# Patient Record
Sex: Female | Born: 1935 | Race: White | Hispanic: No | Marital: Married | Smoking: Former smoker
Health system: Southern US, Community
[De-identification: ages and names within clinical notes are randomized; demographics above are authoritative.]

## PROBLEM LIST (undated history)

## (undated) DIAGNOSIS — T4145XA Adverse effect of unspecified anesthetic, initial encounter: Secondary | ICD-10-CM

## (undated) DIAGNOSIS — I739 Peripheral vascular disease, unspecified: Secondary | ICD-10-CM

## (undated) DIAGNOSIS — E669 Obesity, unspecified: Secondary | ICD-10-CM

## (undated) DIAGNOSIS — H209 Unspecified iridocyclitis: Secondary | ICD-10-CM

## (undated) DIAGNOSIS — K219 Gastro-esophageal reflux disease without esophagitis: Secondary | ICD-10-CM

## (undated) DIAGNOSIS — M1990 Unspecified osteoarthritis, unspecified site: Secondary | ICD-10-CM

## (undated) DIAGNOSIS — I428 Other cardiomyopathies: Secondary | ICD-10-CM

## (undated) DIAGNOSIS — G4733 Obstructive sleep apnea (adult) (pediatric): Secondary | ICD-10-CM

## (undated) DIAGNOSIS — N289 Disorder of kidney and ureter, unspecified: Secondary | ICD-10-CM

## (undated) DIAGNOSIS — I639 Cerebral infarction, unspecified: Secondary | ICD-10-CM

## (undated) DIAGNOSIS — F329 Major depressive disorder, single episode, unspecified: Secondary | ICD-10-CM

## (undated) DIAGNOSIS — D649 Anemia, unspecified: Secondary | ICD-10-CM

## (undated) DIAGNOSIS — T8859XA Other complications of anesthesia, initial encounter: Secondary | ICD-10-CM

## (undated) DIAGNOSIS — H9190 Unspecified hearing loss, unspecified ear: Secondary | ICD-10-CM

## (undated) DIAGNOSIS — I480 Paroxysmal atrial fibrillation: Secondary | ICD-10-CM

## (undated) DIAGNOSIS — C801 Malignant (primary) neoplasm, unspecified: Secondary | ICD-10-CM

## (undated) DIAGNOSIS — I82409 Acute embolism and thrombosis of unspecified deep veins of unspecified lower extremity: Secondary | ICD-10-CM

## (undated) DIAGNOSIS — E785 Hyperlipidemia, unspecified: Secondary | ICD-10-CM

## (undated) DIAGNOSIS — Z7901 Long term (current) use of anticoagulants: Secondary | ICD-10-CM

## (undated) DIAGNOSIS — E119 Type 2 diabetes mellitus without complications: Secondary | ICD-10-CM

## (undated) DIAGNOSIS — I10 Essential (primary) hypertension: Secondary | ICD-10-CM

## (undated) DIAGNOSIS — E079 Disorder of thyroid, unspecified: Secondary | ICD-10-CM

## (undated) DIAGNOSIS — I214 Non-ST elevation (NSTEMI) myocardial infarction: Secondary | ICD-10-CM

## (undated) DIAGNOSIS — F3289 Other specified depressive episodes: Secondary | ICD-10-CM

## (undated) DIAGNOSIS — E039 Hypothyroidism, unspecified: Secondary | ICD-10-CM

## (undated) DIAGNOSIS — Z9989 Dependence on other enabling machines and devices: Secondary | ICD-10-CM

## (undated) HISTORY — DX: Malignant (primary) neoplasm, unspecified: C80.1

## (undated) HISTORY — DX: Obstructive sleep apnea (adult) (pediatric): G47.33

## (undated) HISTORY — DX: Unspecified osteoarthritis, unspecified site: M19.90

## (undated) HISTORY — PX: FOOT SURGERY: SHX648

## (undated) HISTORY — DX: Acute embolism and thrombosis of unspecified deep veins of unspecified lower extremity: I82.409

## (undated) HISTORY — DX: Unspecified iridocyclitis: H20.9

## (undated) HISTORY — DX: Major depressive disorder, single episode, unspecified: F32.9

## (undated) HISTORY — DX: Long term (current) use of anticoagulants: Z79.01

## (undated) HISTORY — DX: Other specified depressive episodes: F32.89

## (undated) HISTORY — DX: Hyperlipidemia, unspecified: E78.5

## (undated) HISTORY — DX: Essential (primary) hypertension: I10

## (undated) HISTORY — DX: Obesity, unspecified: E66.9

## (undated) HISTORY — DX: Other cardiomyopathies: I42.8

## (undated) HISTORY — DX: Paroxysmal atrial fibrillation: I48.0

## (undated) HISTORY — DX: Dependence on other enabling machines and devices: Z99.89

## (undated) HISTORY — DX: Non-ST elevation (NSTEMI) myocardial infarction: I21.4

## (undated) HISTORY — PX: CATARACT EXTRACTION: SUR2

## (undated) HISTORY — PX: COLONOSCOPY W/ POLYPECTOMY: SHX1380

## (undated) HISTORY — DX: Disorder of thyroid, unspecified: E07.9

## (undated) HISTORY — PX: CARDIAC CATHETERIZATION: SHX172

## (undated) HISTORY — DX: Disorder of kidney and ureter, unspecified: N28.9

## (undated) HISTORY — DX: Anemia, unspecified: D64.9

## (undated) HISTORY — DX: Peripheral vascular disease, unspecified: I73.9

## (undated) HISTORY — PX: MASTECTOMY: SHX3

## (undated) HISTORY — DX: Type 2 diabetes mellitus without complications: E11.9

## (undated) HISTORY — PX: BREAST LUMPECTOMY: SHX2

---
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
## 1967-05-26 DIAGNOSIS — E079 Disorder of thyroid, unspecified: Secondary | ICD-10-CM

## 1967-05-26 HISTORY — DX: Disorder of thyroid, unspecified: E07.9

## 1967-05-26 HISTORY — PX: THYROIDECTOMY, PARTIAL: SHX18

## 1972-05-25 HISTORY — PX: CYSTECTOMY: SUR359

## 1972-05-25 HISTORY — PX: ABDOMINAL HYSTERECTOMY: SHX81

## 1998-05-25 HISTORY — PX: PAROTID GLAND TUMOR EXCISION: SHX5221

## 1998-07-22 ENCOUNTER — Encounter: Payer: Self-pay | Admitting: Hematology and Oncology

## 1998-07-22 ENCOUNTER — Ambulatory Visit (HOSPITAL_COMMUNITY): Admission: RE | Admit: 1998-07-22 | Discharge: 1998-07-22 | Payer: Self-pay | Admitting: Hematology and Oncology

## 1998-10-17 ENCOUNTER — Ambulatory Visit: Admission: RE | Admit: 1998-10-17 | Discharge: 1998-10-17 | Payer: Self-pay

## 1999-05-12 ENCOUNTER — Encounter: Payer: Self-pay | Admitting: Otolaryngology

## 1999-05-14 ENCOUNTER — Encounter (INDEPENDENT_AMBULATORY_CARE_PROVIDER_SITE_OTHER): Payer: Self-pay | Admitting: *Deleted

## 1999-05-14 ENCOUNTER — Ambulatory Visit (HOSPITAL_COMMUNITY): Admission: RE | Admit: 1999-05-14 | Discharge: 1999-05-15 | Payer: Self-pay | Admitting: Otolaryngology

## 2000-08-09 ENCOUNTER — Other Ambulatory Visit: Admission: RE | Admit: 2000-08-09 | Discharge: 2000-08-09 | Payer: Self-pay | Admitting: Internal Medicine

## 2000-08-25 ENCOUNTER — Ambulatory Visit (HOSPITAL_COMMUNITY): Admission: RE | Admit: 2000-08-25 | Discharge: 2000-08-25 | Payer: Self-pay | Admitting: Internal Medicine

## 2000-11-08 ENCOUNTER — Encounter: Admission: RE | Admit: 2000-11-08 | Discharge: 2000-12-22 | Payer: Self-pay | Admitting: Internal Medicine

## 2001-02-17 ENCOUNTER — Encounter: Payer: Self-pay | Admitting: Internal Medicine

## 2001-02-17 ENCOUNTER — Encounter: Admission: RE | Admit: 2001-02-17 | Discharge: 2001-02-17 | Payer: Self-pay | Admitting: Internal Medicine

## 2001-03-11 ENCOUNTER — Ambulatory Visit (HOSPITAL_BASED_OUTPATIENT_CLINIC_OR_DEPARTMENT_OTHER): Admission: RE | Admit: 2001-03-11 | Discharge: 2001-03-11 | Payer: Self-pay | Admitting: Internal Medicine

## 2001-03-29 ENCOUNTER — Encounter: Payer: Self-pay | Admitting: Internal Medicine

## 2001-03-29 ENCOUNTER — Encounter: Admission: RE | Admit: 2001-03-29 | Discharge: 2001-03-29 | Payer: Self-pay | Admitting: Internal Medicine

## 2001-04-11 ENCOUNTER — Encounter: Admission: RE | Admit: 2001-04-11 | Discharge: 2001-07-10 | Payer: Self-pay | Admitting: Internal Medicine

## 2001-04-12 ENCOUNTER — Encounter: Admission: RE | Admit: 2001-04-12 | Discharge: 2001-04-12 | Payer: Self-pay | Admitting: Internal Medicine

## 2001-04-12 ENCOUNTER — Encounter: Payer: Self-pay | Admitting: Internal Medicine

## 2002-05-25 DIAGNOSIS — I639 Cerebral infarction, unspecified: Secondary | ICD-10-CM

## 2002-05-25 HISTORY — DX: Cerebral infarction, unspecified: I63.9

## 2002-07-18 ENCOUNTER — Encounter: Payer: Self-pay | Admitting: Internal Medicine

## 2002-07-18 ENCOUNTER — Inpatient Hospital Stay (HOSPITAL_COMMUNITY): Admission: EM | Admit: 2002-07-18 | Discharge: 2002-07-20 | Payer: Self-pay | Admitting: Emergency Medicine

## 2002-07-19 ENCOUNTER — Encounter: Payer: Self-pay | Admitting: Cardiology

## 2002-09-26 ENCOUNTER — Other Ambulatory Visit: Admission: RE | Admit: 2002-09-26 | Discharge: 2002-09-26 | Payer: Self-pay | Admitting: Internal Medicine

## 2002-11-13 ENCOUNTER — Encounter (INDEPENDENT_AMBULATORY_CARE_PROVIDER_SITE_OTHER): Payer: Self-pay | Admitting: Specialist

## 2002-11-13 ENCOUNTER — Ambulatory Visit (HOSPITAL_COMMUNITY): Admission: RE | Admit: 2002-11-13 | Discharge: 2002-11-13 | Payer: Self-pay | Admitting: Gastroenterology

## 2002-12-27 ENCOUNTER — Emergency Department (HOSPITAL_COMMUNITY): Admission: EM | Admit: 2002-12-27 | Discharge: 2002-12-27 | Payer: Self-pay | Admitting: Emergency Medicine

## 2003-01-15 ENCOUNTER — Encounter: Admission: RE | Admit: 2003-01-15 | Discharge: 2003-01-15 | Payer: Self-pay | Admitting: Gastroenterology

## 2003-01-15 ENCOUNTER — Encounter: Payer: Self-pay | Admitting: Gastroenterology

## 2003-01-19 ENCOUNTER — Encounter: Admission: RE | Admit: 2003-01-19 | Discharge: 2003-01-19 | Payer: Self-pay | Admitting: Internal Medicine

## 2003-01-19 ENCOUNTER — Encounter: Payer: Self-pay | Admitting: Internal Medicine

## 2003-01-19 ENCOUNTER — Ambulatory Visit (HOSPITAL_COMMUNITY): Admission: RE | Admit: 2003-01-19 | Discharge: 2003-01-19 | Payer: Self-pay | Admitting: Internal Medicine

## 2004-04-23 ENCOUNTER — Ambulatory Visit: Payer: Self-pay | Admitting: Internal Medicine

## 2004-06-05 ENCOUNTER — Ambulatory Visit: Payer: Self-pay | Admitting: Internal Medicine

## 2004-08-28 ENCOUNTER — Ambulatory Visit: Payer: Self-pay | Admitting: Internal Medicine

## 2004-09-18 ENCOUNTER — Ambulatory Visit: Payer: Self-pay | Admitting: Internal Medicine

## 2004-10-27 ENCOUNTER — Ambulatory Visit: Payer: Self-pay | Admitting: Internal Medicine

## 2004-12-26 ENCOUNTER — Ambulatory Visit: Payer: Self-pay | Admitting: Internal Medicine

## 2004-12-31 ENCOUNTER — Ambulatory Visit: Payer: Self-pay | Admitting: Internal Medicine

## 2005-03-02 ENCOUNTER — Ambulatory Visit: Payer: Self-pay | Admitting: Internal Medicine

## 2005-06-03 ENCOUNTER — Ambulatory Visit: Payer: Self-pay | Admitting: Internal Medicine

## 2005-09-02 ENCOUNTER — Ambulatory Visit: Payer: Self-pay | Admitting: Internal Medicine

## 2005-09-17 ENCOUNTER — Ambulatory Visit: Payer: Self-pay | Admitting: Internal Medicine

## 2005-09-18 ENCOUNTER — Ambulatory Visit: Payer: Self-pay | Admitting: Internal Medicine

## 2005-10-22 ENCOUNTER — Ambulatory Visit: Payer: Self-pay | Admitting: Internal Medicine

## 2005-10-26 ENCOUNTER — Emergency Department (HOSPITAL_COMMUNITY): Admission: EM | Admit: 2005-10-26 | Discharge: 2005-10-26 | Payer: Self-pay | Admitting: Emergency Medicine

## 2005-10-30 ENCOUNTER — Ambulatory Visit: Payer: Self-pay | Admitting: Internal Medicine

## 2005-11-05 ENCOUNTER — Encounter: Payer: Self-pay | Admitting: Vascular Surgery

## 2005-11-05 ENCOUNTER — Ambulatory Visit: Payer: Self-pay | Admitting: Endocrinology

## 2005-11-05 ENCOUNTER — Inpatient Hospital Stay (HOSPITAL_COMMUNITY): Admission: EM | Admit: 2005-11-05 | Discharge: 2005-11-08 | Payer: Self-pay | Admitting: Emergency Medicine

## 2005-11-06 ENCOUNTER — Encounter: Payer: Self-pay | Admitting: Orthopedic Surgery

## 2005-12-01 ENCOUNTER — Encounter: Admission: RE | Admit: 2005-12-01 | Discharge: 2005-12-23 | Payer: Self-pay | Admitting: Orthopedic Surgery

## 2005-12-01 ENCOUNTER — Ambulatory Visit: Payer: Self-pay | Admitting: Internal Medicine

## 2005-12-15 ENCOUNTER — Ambulatory Visit: Payer: Self-pay | Admitting: Internal Medicine

## 2006-01-15 ENCOUNTER — Ambulatory Visit: Payer: Self-pay | Admitting: Internal Medicine

## 2006-03-02 ENCOUNTER — Ambulatory Visit: Payer: Self-pay | Admitting: Internal Medicine

## 2006-04-09 ENCOUNTER — Ambulatory Visit: Payer: Self-pay | Admitting: Internal Medicine

## 2006-04-09 LAB — CONVERTED CEMR LAB
Basophils Absolute: 0 10*3/uL (ref 0.0–0.1)
Basophils Relative: 0.2 % (ref 0.0–1.0)
CO2: 26 meq/L (ref 19–32)
Calcium: 9.2 mg/dL (ref 8.4–10.5)
Eosinophil percent: 0.4 % (ref 0.0–5.0)
Glomerular Filtration Rate, Af Am: 38 mL/min/{1.73_m2}
Lymphocytes Relative: 13.1 % (ref 12.0–46.0)
MCHC: 33.1 g/dL (ref 30.0–36.0)
Neutrophils Relative %: 77.1 % — ABNORMAL HIGH (ref 43.0–77.0)
Potassium: 5 meq/L (ref 3.5–5.1)
RBC: 3.7 M/uL — ABNORMAL LOW (ref 3.87–5.11)
RDW: 16 % — ABNORMAL HIGH (ref 11.5–14.6)
Saturation Ratios: 8.6 % — ABNORMAL LOW (ref 20.0–50.0)
TSH: 1.81 microintl units/mL (ref 0.35–5.50)
Total Protein: 6.9 g/dL (ref 6.0–8.3)

## 2006-04-13 ENCOUNTER — Ambulatory Visit: Payer: Self-pay | Admitting: Internal Medicine

## 2006-05-25 HISTORY — PX: EYE SURGERY: SHX253

## 2006-06-07 ENCOUNTER — Ambulatory Visit: Payer: Self-pay | Admitting: Internal Medicine

## 2006-08-03 ENCOUNTER — Ambulatory Visit: Payer: Self-pay | Admitting: Internal Medicine

## 2006-08-03 LAB — CONVERTED CEMR LAB
AST: 17 units/L (ref 0–37)
Alkaline Phosphatase: 63 units/L (ref 39–117)
Bilirubin, Direct: 0.1 mg/dL (ref 0.0–0.3)
Calcium: 9.7 mg/dL (ref 8.4–10.5)
Chloride: 106 meq/L (ref 96–112)
Direct LDL: 199.9 mg/dL
GFR calc Af Amer: 38 mL/min
Glucose, Bld: 119 mg/dL — ABNORMAL HIGH (ref 70–99)
HDL: 42.9 mg/dL (ref >39.0)
Hgb A1c MFr Bld: 6.3 % — ABNORMAL HIGH (ref 4.6–6.0)
Sed Rate: 34 mm/hr — ABNORMAL HIGH (ref 0–25)
Sodium: 141 meq/L (ref 135–145)
Total Bilirubin: 0.7 mg/dL (ref 0.3–1.2)
Total CHOL/HDL Ratio: 6.7
Total Protein: 7.2 g/dL (ref 6.0–8.3)
Triglycerides: 242 mg/dL (ref 0–149)

## 2006-08-06 ENCOUNTER — Ambulatory Visit: Payer: Self-pay | Admitting: Internal Medicine

## 2006-08-31 ENCOUNTER — Ambulatory Visit: Payer: Self-pay | Admitting: Internal Medicine

## 2006-09-27 ENCOUNTER — Ambulatory Visit: Payer: Self-pay | Admitting: Internal Medicine

## 2006-09-27 LAB — CONVERTED CEMR LAB
Creatinine, Ser: 1.4 mg/dL — ABNORMAL HIGH (ref 0.4–1.2)
Microalb, Ur: 3.1 mg/dL — ABNORMAL HIGH (ref 0.0–1.9)
PTH: 29.2 pg/mL (ref 14.0–72.0)
Sed Rate: 25 mm/hr (ref 0–25)

## 2006-09-29 ENCOUNTER — Ambulatory Visit: Payer: Self-pay | Admitting: Internal Medicine

## 2006-12-17 ENCOUNTER — Ambulatory Visit: Payer: Self-pay | Admitting: Internal Medicine

## 2006-12-17 LAB — CONVERTED CEMR LAB
Basophils Absolute: 0 10*3/uL (ref 0.0–0.1)
Chloride: 100 meq/L (ref 96–112)
Creatinine, Ser: 1.3 mg/dL — ABNORMAL HIGH (ref 0.4–1.2)
GFR calc non Af Amer: 43 mL/min
HCT: 35.3 % — ABNORMAL LOW (ref 36.0–46.0)
Hemoglobin: 12.1 g/dL (ref 12.0–15.0)
MCV: 91.2 fL (ref 78.0–100.0)
Monocytes Absolute: 0.6 10*3/uL (ref 0.2–0.7)
Monocytes Relative: 7.2 % (ref 3.0–11.0)
Neutrophils Relative %: 79.9 % — ABNORMAL HIGH (ref 43.0–77.0)
RBC: 3.87 M/uL (ref 3.87–5.11)
RDW: 13.4 % (ref 11.5–14.6)
Saturation Ratios: 15.4 % — ABNORMAL LOW (ref 20.0–50.0)
Sodium: 137 meq/L (ref 135–145)
Transferrin: 282.3 mg/dL (ref >=212.0)
Vit D, 1,25-Dihydroxy: 14 — ABNORMAL LOW (ref 20–57)
Vitamin B-12: >1500 pg/mL — ABNORMAL HIGH (ref 211–911)
WBC: 8.3 10*3/uL (ref 4.5–10.5)

## 2006-12-24 ENCOUNTER — Ambulatory Visit: Payer: Self-pay | Admitting: Internal Medicine

## 2007-03-17 ENCOUNTER — Ambulatory Visit: Payer: Self-pay | Admitting: Internal Medicine

## 2007-03-17 LAB — CONVERTED CEMR LAB
ALT: 16 units/L (ref 0–35)
Albumin: 3.8 g/dL (ref 3.5–5.2)
BUN: 26 mg/dL — ABNORMAL HIGH (ref 6–23)
Basophils Absolute: 0.1 10*3/uL (ref 0.0–0.1)
Basophils Relative: 1 % (ref 0.0–1.0)
CO2: 26 meq/L (ref 19–32)
Creatinine, Ser: 1.4 mg/dL — ABNORMAL HIGH (ref 0.4–1.2)
Direct LDL: 174.3 mg/dL
Eosinophils Absolute: 0 10*3/uL (ref 0.0–0.6)
GFR calc non Af Amer: 40 mL/min
Hgb A1c MFr Bld: 5.9 % (ref 4.6–6.0)
Lymphocytes Relative: 16.2 % (ref 12.0–46.0)
MCHC: 35.2 g/dL (ref 30.0–36.0)
MCV: 93.5 fL (ref 78.0–100.0)
Platelets: 380 10*3/uL (ref 150–400)
Potassium: 4 meq/L (ref 3.5–5.1)
TSH: 0.48 microintl units/mL (ref 0.35–5.50)
Total Bilirubin: 0.8 mg/dL (ref 0.3–1.2)
Total Protein: 6.8 g/dL (ref 6.0–8.3)
VLDL: 74 mg/dL — ABNORMAL HIGH (ref 0–40)

## 2007-03-22 ENCOUNTER — Encounter: Payer: Self-pay | Admitting: Internal Medicine

## 2007-03-22 ENCOUNTER — Ambulatory Visit: Payer: Self-pay | Admitting: Internal Medicine

## 2007-03-22 DIAGNOSIS — M1990 Unspecified osteoarthritis, unspecified site: Secondary | ICD-10-CM | POA: Insufficient documentation

## 2007-03-22 DIAGNOSIS — F329 Major depressive disorder, single episode, unspecified: Secondary | ICD-10-CM

## 2007-03-22 DIAGNOSIS — F411 Generalized anxiety disorder: Secondary | ICD-10-CM | POA: Insufficient documentation

## 2007-03-22 DIAGNOSIS — E119 Type 2 diabetes mellitus without complications: Secondary | ICD-10-CM

## 2007-03-22 DIAGNOSIS — D649 Anemia, unspecified: Secondary | ICD-10-CM | POA: Insufficient documentation

## 2007-03-22 DIAGNOSIS — M545 Low back pain: Secondary | ICD-10-CM

## 2007-03-22 DIAGNOSIS — E785 Hyperlipidemia, unspecified: Secondary | ICD-10-CM

## 2007-03-22 DIAGNOSIS — M353 Polymyalgia rheumatica: Secondary | ICD-10-CM | POA: Insufficient documentation

## 2007-04-28 ENCOUNTER — Telehealth: Payer: Self-pay | Admitting: Internal Medicine

## 2007-04-28 DIAGNOSIS — M21619 Bunion of unspecified foot: Secondary | ICD-10-CM

## 2007-05-13 ENCOUNTER — Encounter: Payer: Self-pay | Admitting: Internal Medicine

## 2007-06-17 ENCOUNTER — Encounter: Payer: Self-pay | Admitting: Internal Medicine

## 2007-07-05 ENCOUNTER — Ambulatory Visit: Payer: Self-pay | Admitting: Internal Medicine

## 2007-07-05 DIAGNOSIS — N184 Chronic kidney disease, stage 4 (severe): Secondary | ICD-10-CM | POA: Insufficient documentation

## 2007-07-05 DIAGNOSIS — M109 Gout, unspecified: Secondary | ICD-10-CM

## 2007-07-06 ENCOUNTER — Telehealth: Payer: Self-pay | Admitting: Internal Medicine

## 2007-07-13 ENCOUNTER — Encounter: Payer: Self-pay | Admitting: Internal Medicine

## 2007-08-03 ENCOUNTER — Encounter: Admission: RE | Admit: 2007-08-03 | Discharge: 2007-08-03 | Payer: Self-pay | Admitting: Orthopedic Surgery

## 2007-08-25 ENCOUNTER — Ambulatory Visit: Payer: Self-pay | Admitting: Internal Medicine

## 2007-08-25 DIAGNOSIS — G4710 Hypersomnia, unspecified: Secondary | ICD-10-CM | POA: Insufficient documentation

## 2007-08-25 DIAGNOSIS — G4730 Sleep apnea, unspecified: Secondary | ICD-10-CM

## 2007-08-25 DIAGNOSIS — R0609 Other forms of dyspnea: Secondary | ICD-10-CM

## 2007-08-25 DIAGNOSIS — G4733 Obstructive sleep apnea (adult) (pediatric): Secondary | ICD-10-CM

## 2007-08-25 DIAGNOSIS — E669 Obesity, unspecified: Secondary | ICD-10-CM

## 2007-08-30 ENCOUNTER — Ambulatory Visit: Payer: Self-pay | Admitting: Internal Medicine

## 2007-08-31 ENCOUNTER — Encounter: Payer: Self-pay | Admitting: Internal Medicine

## 2007-09-07 ENCOUNTER — Encounter: Payer: Self-pay | Admitting: Internal Medicine

## 2007-09-16 ENCOUNTER — Inpatient Hospital Stay (HOSPITAL_COMMUNITY): Admission: RE | Admit: 2007-09-16 | Discharge: 2007-09-19 | Payer: Self-pay | Admitting: Orthopedic Surgery

## 2007-10-06 ENCOUNTER — Ambulatory Visit: Payer: Self-pay | Admitting: Internal Medicine

## 2007-10-07 LAB — CONVERTED CEMR LAB
Calcium: 9.1 mg/dL (ref 8.4–10.5)
Creatinine, Ser: 1.9 mg/dL — ABNORMAL HIGH (ref 0.4–1.2)
GFR calc Af Amer: 34 mL/min
GFR calc non Af Amer: 28 mL/min
Glucose, Bld: 110 mg/dL — ABNORMAL HIGH (ref 70–99)
Potassium: 4.6 meq/L (ref 3.5–5.1)

## 2007-10-11 ENCOUNTER — Ambulatory Visit: Payer: Self-pay | Admitting: Internal Medicine

## 2007-10-11 DIAGNOSIS — R600 Localized edema: Secondary | ICD-10-CM

## 2007-10-19 ENCOUNTER — Encounter: Admission: RE | Admit: 2007-10-19 | Discharge: 2007-10-19 | Payer: Self-pay | Admitting: Orthopedic Surgery

## 2007-10-24 ENCOUNTER — Ambulatory Visit: Payer: Self-pay | Admitting: Internal Medicine

## 2007-10-26 ENCOUNTER — Encounter: Admission: RE | Admit: 2007-10-26 | Discharge: 2007-10-26 | Payer: Self-pay | Admitting: Orthopedic Surgery

## 2007-10-28 ENCOUNTER — Encounter (HOSPITAL_BASED_OUTPATIENT_CLINIC_OR_DEPARTMENT_OTHER): Admission: RE | Admit: 2007-10-28 | Discharge: 2008-01-24 | Payer: Self-pay | Admitting: Surgery

## 2007-12-01 ENCOUNTER — Ambulatory Visit (HOSPITAL_COMMUNITY): Admission: RE | Admit: 2007-12-01 | Discharge: 2007-12-01 | Payer: Self-pay | Admitting: Internal Medicine

## 2007-12-19 ENCOUNTER — Telehealth: Payer: Self-pay | Admitting: Internal Medicine

## 2008-01-11 ENCOUNTER — Ambulatory Visit: Payer: Self-pay | Admitting: Internal Medicine

## 2008-01-12 LAB — CONVERTED CEMR LAB
Creatinine, Ser: 2 mg/dL — ABNORMAL HIGH (ref 0.4–1.2)
GFR calc non Af Amer: 26 mL/min
Potassium: 4.1 meq/L (ref 3.5–5.1)
Sodium: 140 meq/L (ref 135–145)

## 2008-01-17 ENCOUNTER — Ambulatory Visit: Payer: Self-pay | Admitting: Internal Medicine

## 2008-01-17 DIAGNOSIS — K120 Recurrent oral aphthae: Secondary | ICD-10-CM | POA: Insufficient documentation

## 2008-01-17 DIAGNOSIS — I10 Essential (primary) hypertension: Secondary | ICD-10-CM

## 2008-02-16 ENCOUNTER — Encounter (HOSPITAL_BASED_OUTPATIENT_CLINIC_OR_DEPARTMENT_OTHER): Admission: RE | Admit: 2008-02-16 | Discharge: 2008-02-21 | Payer: Self-pay | Admitting: Internal Medicine

## 2008-03-15 ENCOUNTER — Encounter (HOSPITAL_BASED_OUTPATIENT_CLINIC_OR_DEPARTMENT_OTHER): Admission: RE | Admit: 2008-03-15 | Discharge: 2008-05-15 | Payer: Self-pay | Admitting: Internal Medicine

## 2008-04-06 ENCOUNTER — Ambulatory Visit: Payer: Self-pay | Admitting: Internal Medicine

## 2008-04-08 LAB — CONVERTED CEMR LAB
BUN: 22 mg/dL (ref 6–23)
Basophils Absolute: 0 10*3/uL (ref 0.0–0.1)
Basophils Relative: 0.1 % (ref 0.0–3.0)
Creatinine, Ser: 1.3 mg/dL — ABNORMAL HIGH (ref 0.4–1.2)
Eosinophils Absolute: 0 10*3/uL (ref 0.0–0.7)
GFR calc Af Amer: 52 mL/min
GFR calc non Af Amer: 43 mL/min
HCT: 34.4 % — ABNORMAL LOW (ref 36.0–46.0)
MCHC: 32.7 g/dL (ref 30.0–36.0)
Monocytes Absolute: 0.5 10*3/uL (ref 0.1–1.0)
Potassium: 4.2 meq/L (ref 3.5–5.1)

## 2008-04-13 ENCOUNTER — Ambulatory Visit: Payer: Self-pay | Admitting: Internal Medicine

## 2008-04-24 ENCOUNTER — Ambulatory Visit: Payer: Self-pay | Admitting: Internal Medicine

## 2008-05-14 ENCOUNTER — Ambulatory Visit: Payer: Self-pay | Admitting: Internal Medicine

## 2008-05-15 ENCOUNTER — Encounter: Payer: Self-pay | Admitting: Internal Medicine

## 2008-05-16 LAB — CONVERTED CEMR LAB: TSH: 0.1 microintl units/mL — ABNORMAL LOW (ref 0.35–5.50)

## 2008-05-25 DIAGNOSIS — C801 Malignant (primary) neoplasm, unspecified: Secondary | ICD-10-CM

## 2008-05-25 DIAGNOSIS — I82409 Acute embolism and thrombosis of unspecified deep veins of unspecified lower extremity: Secondary | ICD-10-CM

## 2008-05-25 HISTORY — DX: Malignant (primary) neoplasm, unspecified: C80.1

## 2008-05-25 HISTORY — DX: Acute embolism and thrombosis of unspecified deep veins of unspecified lower extremity: I82.409

## 2008-05-28 ENCOUNTER — Telehealth: Payer: Self-pay | Admitting: Internal Medicine

## 2008-05-31 ENCOUNTER — Encounter (HOSPITAL_BASED_OUTPATIENT_CLINIC_OR_DEPARTMENT_OTHER): Admission: RE | Admit: 2008-05-31 | Discharge: 2008-08-29 | Payer: Self-pay | Admitting: Internal Medicine

## 2008-06-19 ENCOUNTER — Encounter: Payer: Self-pay | Admitting: Internal Medicine

## 2008-06-28 ENCOUNTER — Encounter: Payer: Self-pay | Admitting: Internal Medicine

## 2008-08-10 ENCOUNTER — Ambulatory Visit: Payer: Self-pay | Admitting: Internal Medicine

## 2008-08-13 LAB — CONVERTED CEMR LAB
CO2: 28 meq/L (ref 19–32)
Calcium: 9.4 mg/dL (ref 8.4–10.5)
Chloride: 105 meq/L (ref 96–112)
Creatinine, Ser: 1.2 mg/dL (ref 0.4–1.2)
GFR calc non Af Amer: 47.14 mL/min (ref >60)
Potassium: 4.1 meq/L (ref 3.5–5.1)
Sodium: 141 meq/L (ref 135–145)
TSH: 0.91 microintl units/mL (ref 0.35–5.50)
Uric Acid, Serum: 7.2 mg/dL — ABNORMAL HIGH (ref 2.4–7.0)

## 2008-08-15 ENCOUNTER — Ambulatory Visit: Payer: Self-pay | Admitting: Internal Medicine

## 2008-10-18 ENCOUNTER — Telehealth: Payer: Self-pay | Admitting: Internal Medicine

## 2008-12-06 ENCOUNTER — Ambulatory Visit: Payer: Self-pay | Admitting: Internal Medicine

## 2008-12-06 LAB — CONVERTED CEMR LAB
BUN: 30 mg/dL — ABNORMAL HIGH (ref 6–23)
Basophils Relative: 1.6 % (ref 0.0–3.0)
Bilirubin, Direct: 0.2 mg/dL (ref 0.0–0.3)
CO2: 27 meq/L (ref 19–32)
Chloride: 104 meq/L (ref 96–112)
Eosinophils Absolute: 0 10*3/uL (ref 0.0–0.7)
Glucose, Bld: 90 mg/dL (ref 70–99)
HCT: 35.4 % — ABNORMAL LOW (ref 36.0–46.0)
HDL: 36.5 mg/dL — ABNORMAL LOW (ref >39.00)
Hemoglobin: 12.1 g/dL (ref 12.0–15.0)
Lymphocytes Relative: 10.3 % — ABNORMAL LOW (ref 12.0–46.0)
Lymphs Abs: 0.5 10*3/uL — ABNORMAL LOW (ref 0.7–4.0)
MCHC: 34.1 g/dL (ref 30.0–36.0)
MCV: 97 fL (ref 78.0–100.0)
Neutro Abs: 4 10*3/uL (ref 1.4–7.7)
Potassium: 4.5 meq/L (ref 3.5–5.1)
RBC: 3.65 M/uL — ABNORMAL LOW (ref 3.87–5.11)
Sodium: 139 meq/L (ref 135–145)
Total Bilirubin: 1 mg/dL (ref 0.3–1.2)
Total CHOL/HDL Ratio: 6
Total Protein: 6.9 g/dL (ref 6.0–8.3)
VLDL: 35 mg/dL (ref 0.0–40.0)

## 2008-12-11 ENCOUNTER — Ambulatory Visit: Payer: Self-pay | Admitting: Internal Medicine

## 2008-12-11 DIAGNOSIS — Z8639 Personal history of other endocrine, nutritional and metabolic disease: Secondary | ICD-10-CM

## 2008-12-11 DIAGNOSIS — Z862 Personal history of diseases of the blood and blood-forming organs and certain disorders involving the immune mechanism: Secondary | ICD-10-CM

## 2008-12-19 ENCOUNTER — Encounter: Admission: RE | Admit: 2008-12-19 | Discharge: 2008-12-19 | Payer: Self-pay | Admitting: Internal Medicine

## 2008-12-31 ENCOUNTER — Encounter: Payer: Self-pay | Admitting: Internal Medicine

## 2009-01-07 ENCOUNTER — Ambulatory Visit: Payer: Self-pay | Admitting: Internal Medicine

## 2009-01-07 LAB — CONVERTED CEMR LAB
AST: 21 units/L (ref 0–37)
Albumin: 3.6 g/dL (ref 3.5–5.2)
Alkaline Phosphatase: 95 units/L (ref 39–117)
BUN: 19 mg/dL (ref 6–23)
CO2: 25 meq/L (ref 19–32)
Calcium: 9 mg/dL (ref 8.4–10.5)
Glucose, Bld: 121 mg/dL — ABNORMAL HIGH (ref 70–99)
Sodium: 139 meq/L (ref 135–145)
Total Protein: 6.7 g/dL (ref 6.0–8.3)

## 2009-01-09 ENCOUNTER — Encounter: Payer: Self-pay | Admitting: Internal Medicine

## 2009-01-14 ENCOUNTER — Ambulatory Visit: Payer: Self-pay | Admitting: Internal Medicine

## 2009-01-16 ENCOUNTER — Encounter: Admission: RE | Admit: 2009-01-16 | Discharge: 2009-01-16 | Payer: Self-pay | Admitting: *Deleted

## 2009-01-17 ENCOUNTER — Telehealth: Payer: Self-pay | Admitting: Internal Medicine

## 2009-01-18 ENCOUNTER — Encounter: Payer: Self-pay | Admitting: Internal Medicine

## 2009-01-21 ENCOUNTER — Encounter: Payer: Self-pay | Admitting: Internal Medicine

## 2009-01-23 ENCOUNTER — Telehealth (INDEPENDENT_AMBULATORY_CARE_PROVIDER_SITE_OTHER): Payer: Self-pay | Admitting: *Deleted

## 2009-01-24 ENCOUNTER — Ambulatory Visit: Payer: Self-pay

## 2009-01-24 ENCOUNTER — Encounter: Payer: Self-pay | Admitting: Internal Medicine

## 2009-01-24 ENCOUNTER — Encounter: Payer: Self-pay | Admitting: Cardiology

## 2009-01-31 ENCOUNTER — Inpatient Hospital Stay (HOSPITAL_COMMUNITY): Admission: EM | Admit: 2009-01-31 | Discharge: 2009-02-08 | Payer: Self-pay | Admitting: Emergency Medicine

## 2009-01-31 ENCOUNTER — Ambulatory Visit: Payer: Self-pay | Admitting: Internal Medicine

## 2009-01-31 ENCOUNTER — Encounter (INDEPENDENT_AMBULATORY_CARE_PROVIDER_SITE_OTHER): Payer: Self-pay | Admitting: Emergency Medicine

## 2009-01-31 DIAGNOSIS — Z86718 Personal history of other venous thrombosis and embolism: Secondary | ICD-10-CM | POA: Insufficient documentation

## 2009-02-02 ENCOUNTER — Ambulatory Visit: Payer: Self-pay | Admitting: Oncology

## 2009-02-04 ENCOUNTER — Ambulatory Visit: Payer: Self-pay | Admitting: Vascular Surgery

## 2009-02-04 ENCOUNTER — Encounter (INDEPENDENT_AMBULATORY_CARE_PROVIDER_SITE_OTHER): Payer: Self-pay | Admitting: Internal Medicine

## 2009-02-11 ENCOUNTER — Ambulatory Visit: Payer: Self-pay | Admitting: Oncology

## 2009-02-12 ENCOUNTER — Telehealth: Payer: Self-pay | Admitting: Internal Medicine

## 2009-02-13 ENCOUNTER — Ambulatory Visit: Payer: Self-pay | Admitting: Internal Medicine

## 2009-02-13 DIAGNOSIS — C50919 Malignant neoplasm of unspecified site of unspecified female breast: Secondary | ICD-10-CM | POA: Insufficient documentation

## 2009-02-21 ENCOUNTER — Encounter: Payer: Self-pay | Admitting: Internal Medicine

## 2009-02-25 ENCOUNTER — Encounter: Payer: Self-pay | Admitting: Internal Medicine

## 2009-02-25 LAB — CBC WITH DIFFERENTIAL/PLATELET
Eosinophils Absolute: 0.2 10*3/uL (ref 0.0–0.5)
HCT: 33.5 % — ABNORMAL LOW (ref 34.8–46.6)
HGB: 11.2 g/dL — ABNORMAL LOW (ref 11.6–15.9)
LYMPH%: 13.7 % — ABNORMAL LOW (ref 14.0–49.7)
MONO#: 0.6 10*3/uL (ref 0.1–0.9)
NEUT#: 5.2 10*3/uL (ref 1.5–6.5)
NEUT%: 75.3 % (ref 38.4–76.8)
Platelets: 315 10*3/uL (ref 145–400)
WBC: 6.9 10*3/uL (ref 3.9–10.3)

## 2009-03-11 ENCOUNTER — Ambulatory Visit: Payer: Self-pay | Admitting: Internal Medicine

## 2009-03-11 LAB — CONVERTED CEMR LAB
Basophils Relative: 0.9 % (ref 0.0–3.0)
Bilirubin, Direct: 0.1 mg/dL (ref 0.0–0.3)
Calcium: 8.3 mg/dL — ABNORMAL LOW (ref 8.4–10.5)
Chloride: 108 meq/L (ref 96–112)
Creatinine, Ser: 1.2 mg/dL (ref 0.4–1.2)
Eosinophils Absolute: 0.1 10*3/uL (ref 0.0–0.7)
HCT: 35.7 % — ABNORMAL LOW (ref 36.0–46.0)
Hemoglobin: 11.8 g/dL — ABNORMAL LOW (ref 12.0–15.0)
MCHC: 33 g/dL (ref 30.0–36.0)
MCV: 91.1 fL (ref 78.0–100.0)
Monocytes Absolute: 0.5 10*3/uL (ref 0.1–1.0)
Neutro Abs: 4.7 10*3/uL (ref 1.4–7.7)
RBC: 3.92 M/uL (ref 3.87–5.11)
Total Bilirubin: 0.6 mg/dL (ref 0.3–1.2)

## 2009-03-14 ENCOUNTER — Encounter: Payer: Self-pay | Admitting: Internal Medicine

## 2009-03-18 ENCOUNTER — Ambulatory Visit: Payer: Self-pay | Admitting: Internal Medicine

## 2009-03-20 ENCOUNTER — Ambulatory Visit: Payer: Self-pay | Admitting: Oncology

## 2009-03-22 ENCOUNTER — Encounter (INDEPENDENT_AMBULATORY_CARE_PROVIDER_SITE_OTHER): Payer: Self-pay | Admitting: General Surgery

## 2009-03-22 ENCOUNTER — Ambulatory Visit (HOSPITAL_COMMUNITY): Admission: RE | Admit: 2009-03-22 | Discharge: 2009-03-22 | Payer: Self-pay | Admitting: General Surgery

## 2009-03-22 ENCOUNTER — Ambulatory Visit (HOSPITAL_COMMUNITY): Admission: RE | Admit: 2009-03-22 | Discharge: 2009-03-23 | Payer: Self-pay | Admitting: General Surgery

## 2009-03-25 ENCOUNTER — Encounter: Payer: Self-pay | Admitting: Internal Medicine

## 2009-03-29 LAB — PROTIME-INR
INR: 2.4 (ref 2.00–3.50)
Protime: 28.8 Seconds — ABNORMAL HIGH (ref 10.6–13.4)

## 2009-04-01 ENCOUNTER — Encounter: Payer: Self-pay | Admitting: Internal Medicine

## 2009-04-01 LAB — PROTIME-INR
INR: 2.5 (ref 2.00–3.50)
Protime: 30 Seconds — ABNORMAL HIGH (ref 10.6–13.4)

## 2009-04-05 ENCOUNTER — Encounter: Payer: Self-pay | Admitting: Internal Medicine

## 2009-04-05 LAB — PROTIME-INR: INR: 2.4 (ref 2.00–3.50)

## 2009-04-10 ENCOUNTER — Telehealth (INDEPENDENT_AMBULATORY_CARE_PROVIDER_SITE_OTHER): Payer: Self-pay | Admitting: *Deleted

## 2009-04-12 LAB — PROTIME-INR: INR: 1.6 — ABNORMAL LOW (ref 2.00–3.50)

## 2009-04-19 ENCOUNTER — Ambulatory Visit: Payer: Self-pay | Admitting: Oncology

## 2009-04-23 ENCOUNTER — Ambulatory Visit: Payer: Self-pay | Admitting: Internal Medicine

## 2009-04-26 LAB — PROTIME-INR
INR: 2.9 (ref 2.00–3.50)
Protime: 34.8 Seconds — ABNORMAL HIGH (ref 10.6–13.4)

## 2009-05-03 ENCOUNTER — Encounter: Payer: Self-pay | Admitting: Internal Medicine

## 2009-05-03 LAB — CBC WITH DIFFERENTIAL/PLATELET
BASO%: 0.1 % (ref 0.0–2.0)
Eosinophils Absolute: 0.2 10*3/uL (ref 0.0–0.5)
LYMPH%: 12.7 % — ABNORMAL LOW (ref 14.0–49.7)
MCHC: 32.6 g/dL (ref 31.5–36.0)
MONO#: 0.6 10*3/uL (ref 0.1–0.9)
NEUT#: 5.1 10*3/uL (ref 1.5–6.5)
Platelets: 269 10*3/uL (ref 145–400)
RBC: 4.09 10*6/uL (ref 3.70–5.45)
RDW: 14.9 % — ABNORMAL HIGH (ref 11.2–14.5)
WBC: 6.8 10*3/uL (ref 3.9–10.3)
lymph#: 0.9 10*3/uL (ref 0.9–3.3)

## 2009-05-03 LAB — PROTIME-INR: Protime: 34.8 Seconds — ABNORMAL HIGH (ref 10.6–13.4)

## 2009-05-08 ENCOUNTER — Telehealth: Payer: Self-pay | Admitting: Internal Medicine

## 2009-05-08 ENCOUNTER — Encounter: Payer: Self-pay | Admitting: Internal Medicine

## 2009-05-13 ENCOUNTER — Ambulatory Visit: Payer: Self-pay | Admitting: Internal Medicine

## 2009-05-14 ENCOUNTER — Encounter: Payer: Self-pay | Admitting: Internal Medicine

## 2009-05-14 LAB — CONVERTED CEMR LAB
BUN: 25 mg/dL — ABNORMAL HIGH (ref 6–23)
Basophils Absolute: 0.2 10*3/uL — ABNORMAL HIGH (ref 0.0–0.1)
Chloride: 102 meq/L (ref 96–112)
Creatinine, Ser: 1.4 mg/dL — ABNORMAL HIGH (ref 0.4–1.2)
Eosinophils Relative: 2.9 % (ref 0.0–5.0)
GFR calc non Af Amer: 39.38 mL/min (ref >60)
MCV: 88.7 fL (ref 78.0–100.0)
Monocytes Absolute: 0.6 10*3/uL (ref 0.1–1.0)
Neutrophils Relative %: 72.3 % (ref 43.0–77.0)
Platelets: 294 10*3/uL (ref 150.0–400.0)
Sed Rate: 20 mm/hr (ref 0–22)
Uric Acid, Serum: 7.7 mg/dL — ABNORMAL HIGH (ref 2.4–7.0)
WBC: 7.6 10*3/uL (ref 4.5–10.5)

## 2009-05-28 ENCOUNTER — Ambulatory Visit: Payer: Self-pay | Admitting: Internal Medicine

## 2009-05-31 ENCOUNTER — Ambulatory Visit: Payer: Self-pay | Admitting: Oncology

## 2009-06-14 LAB — PROTIME-INR
INR: 3.1 (ref 2.00–3.50)
Protime: 37.2 Seconds — ABNORMAL HIGH (ref 10.6–13.4)

## 2009-07-23 ENCOUNTER — Ambulatory Visit: Payer: Self-pay | Admitting: Oncology

## 2009-07-26 ENCOUNTER — Encounter: Payer: Self-pay | Admitting: Internal Medicine

## 2009-07-26 LAB — CBC WITH DIFFERENTIAL/PLATELET
BASO%: 0.9 % (ref 0.0–2.0)
LYMPH%: 13.1 % — ABNORMAL LOW (ref 14.0–49.7)
MCHC: 32.4 g/dL (ref 31.5–36.0)
MONO#: 0.6 10*3/uL (ref 0.1–0.9)
MONO%: 7.9 % (ref 0.0–14.0)
Platelets: 278 10*3/uL (ref 145–400)
RBC: 3.9 10*6/uL (ref 3.70–5.45)
WBC: 8.2 10*3/uL (ref 3.9–10.3)
nRBC: 0 % (ref 0–0)

## 2009-07-26 LAB — PROTIME-INR: INR: 4.2 — ABNORMAL HIGH (ref 2.00–3.50)

## 2009-07-29 LAB — PROTIME-INR
INR: 2.1 (ref 2.00–3.50)
Protime: 25.2 Seconds — ABNORMAL HIGH (ref 10.6–13.4)

## 2009-08-05 ENCOUNTER — Telehealth: Payer: Self-pay | Admitting: Internal Medicine

## 2009-08-09 ENCOUNTER — Telehealth: Payer: Self-pay | Admitting: Internal Medicine

## 2009-08-21 ENCOUNTER — Ambulatory Visit: Payer: Self-pay | Admitting: Internal Medicine

## 2009-08-21 LAB — CONVERTED CEMR LAB
ALT: 18 units/L (ref 0–35)
Bilirubin, Direct: 0.1 mg/dL (ref 0.0–0.3)
Calcium: 9.3 mg/dL (ref 8.4–10.5)
Chloride: 104 meq/L (ref 96–112)
Creatinine, Ser: 1.4 mg/dL — ABNORMAL HIGH (ref 0.4–1.2)
GFR calc non Af Amer: 39.34 mL/min (ref >60)
Hgb A1c MFr Bld: 6.4 % (ref 4.6–6.5)
Total Bilirubin: 0.5 mg/dL (ref 0.3–1.2)

## 2009-08-23 ENCOUNTER — Ambulatory Visit: Payer: Self-pay | Admitting: Oncology

## 2009-08-27 ENCOUNTER — Encounter: Payer: Self-pay | Admitting: Internal Medicine

## 2009-08-27 LAB — PROTIME-INR

## 2009-08-28 ENCOUNTER — Ambulatory Visit: Payer: Self-pay | Admitting: Internal Medicine

## 2009-09-11 LAB — PROTIME-INR

## 2009-09-16 LAB — PROTIME-INR: Protime: 15.6 Seconds — ABNORMAL HIGH (ref 10.6–13.4)

## 2009-09-23 ENCOUNTER — Ambulatory Visit: Payer: Self-pay | Admitting: Oncology

## 2009-09-24 LAB — PROTIME-INR: Protime: 22.8 Seconds — ABNORMAL HIGH (ref 10.6–13.4)

## 2009-10-02 LAB — PROTIME-INR
INR: 2.5 (ref 2.00–3.50)
Protime: 30 Seconds — ABNORMAL HIGH (ref 10.6–13.4)

## 2009-10-09 LAB — PROTIME-INR
INR: 2.3 (ref 2.00–3.50)
Protime: 27.6 Seconds — ABNORMAL HIGH (ref 10.6–13.4)

## 2009-10-17 ENCOUNTER — Ambulatory Visit: Payer: Self-pay | Admitting: Internal Medicine

## 2009-11-04 ENCOUNTER — Ambulatory Visit: Payer: Self-pay | Admitting: Oncology

## 2009-11-05 ENCOUNTER — Telehealth: Payer: Self-pay | Admitting: Internal Medicine

## 2009-11-06 LAB — PROTIME-INR
INR: 3.5 (ref 2.00–3.50)
Protime: 42 Seconds — ABNORMAL HIGH (ref 10.6–13.4)

## 2009-11-07 ENCOUNTER — Encounter: Payer: Self-pay | Admitting: Internal Medicine

## 2009-11-08 ENCOUNTER — Telehealth: Payer: Self-pay | Admitting: Internal Medicine

## 2009-11-20 ENCOUNTER — Ambulatory Visit: Payer: Self-pay | Admitting: Internal Medicine

## 2009-11-21 LAB — CONVERTED CEMR LAB
ALT: 15 units/L (ref 0–35)
AST: 18 units/L (ref 0–37)
Alkaline Phosphatase: 101 units/L (ref 39–117)
Bilirubin, Direct: 0.1 mg/dL (ref 0.0–0.3)
CO2: 26 meq/L (ref 19–32)
Chloride: 107 meq/L (ref 96–112)
Hgb A1c MFr Bld: 6.5 % (ref 4.6–6.5)
Potassium: 5.4 meq/L — ABNORMAL HIGH (ref 3.5–5.1)
Sed Rate: 52 mm/hr — ABNORMAL HIGH (ref 0–22)
Sodium: 140 meq/L (ref 135–145)
Total Bilirubin: 0.5 mg/dL (ref 0.3–1.2)
Total CHOL/HDL Ratio: 6
Total Protein: 6.8 g/dL (ref 6.0–8.3)

## 2009-11-27 ENCOUNTER — Ambulatory Visit: Payer: Self-pay | Admitting: Internal Medicine

## 2009-11-27 DIAGNOSIS — R51 Headache: Secondary | ICD-10-CM

## 2009-11-28 ENCOUNTER — Encounter: Payer: Self-pay | Admitting: Internal Medicine

## 2009-12-10 ENCOUNTER — Ambulatory Visit: Payer: Self-pay | Admitting: Oncology

## 2009-12-12 LAB — CBC WITH DIFFERENTIAL/PLATELET
BASO%: 0.8 % (ref 0.0–2.0)
EOS%: 2.7 % (ref 0.0–7.0)
MCH: 25.3 pg (ref 25.1–34.0)
MCHC: 30.9 g/dL — ABNORMAL LOW (ref 31.5–36.0)
RBC: 3.95 10*6/uL (ref 3.70–5.45)
RDW: 17.4 % — ABNORMAL HIGH (ref 11.2–14.5)
WBC: 9 10*3/uL (ref 3.9–10.3)
lymph#: 0.9 10*3/uL (ref 0.9–3.3)

## 2009-12-12 LAB — PROTIME-INR: INR: 1.8 — ABNORMAL LOW (ref 2.00–3.50)

## 2009-12-26 LAB — PROTIME-INR: INR: 2.6 (ref 2.00–3.50)

## 2010-01-12 ENCOUNTER — Encounter: Payer: Self-pay | Admitting: Internal Medicine

## 2010-01-14 ENCOUNTER — Ambulatory Visit: Payer: Self-pay | Admitting: Oncology

## 2010-01-16 LAB — PROTIME-INR: Protime: 45.6 Seconds — ABNORMAL HIGH (ref 10.6–13.4)

## 2010-02-06 LAB — PROTIME-INR: INR: 3.1 (ref 2.00–3.50)

## 2010-02-13 ENCOUNTER — Encounter: Payer: Self-pay | Admitting: Internal Medicine

## 2010-02-24 ENCOUNTER — Encounter: Payer: Self-pay | Admitting: Internal Medicine

## 2010-02-25 ENCOUNTER — Ambulatory Visit: Payer: Self-pay | Admitting: Oncology

## 2010-02-26 ENCOUNTER — Ambulatory Visit: Payer: Self-pay | Admitting: Internal Medicine

## 2010-02-26 LAB — CONVERTED CEMR LAB
AST: 19 units/L (ref 0–37)
Albumin: 3.4 g/dL — ABNORMAL LOW (ref 3.5–5.2)
Alkaline Phosphatase: 87 units/L (ref 39–117)
BUN: 24 mg/dL — ABNORMAL HIGH (ref 6–23)
Calcium: 9.4 mg/dL (ref 8.4–10.5)
Eosinophils Relative: 2.6 % (ref 0.0–5.0)
GFR calc non Af Amer: 37.43 mL/min (ref >60)
HCT: 29.3 % — ABNORMAL LOW (ref 36.0–46.0)
Hemoglobin: 9.7 g/dL — ABNORMAL LOW (ref 12.0–15.0)
Hgb A1c MFr Bld: 6.8 % — ABNORMAL HIGH (ref 4.6–6.5)
Lymphs Abs: 0.6 10*3/uL — ABNORMAL LOW (ref 0.7–4.0)
MCV: 80.5 fL (ref 78.0–100.0)
Monocytes Absolute: 0.6 10*3/uL (ref 0.1–1.0)
Monocytes Relative: 7 % (ref 3.0–12.0)
Neutro Abs: 6.7 10*3/uL (ref 1.4–7.7)
Platelets: 379 10*3/uL (ref 150.0–400.0)
Potassium: 4.9 meq/L (ref 3.5–5.1)
Sodium: 140 meq/L (ref 135–145)
Total Bilirubin: 0.4 mg/dL (ref 0.3–1.2)
WBC: 8.2 10*3/uL (ref 4.5–10.5)

## 2010-02-27 ENCOUNTER — Encounter: Payer: Self-pay | Admitting: Internal Medicine

## 2010-02-27 LAB — PROTIME-INR
INR: 3.3 (ref 2.00–3.50)
Protime: 39.6 Seconds — ABNORMAL HIGH (ref 10.6–13.4)

## 2010-03-04 ENCOUNTER — Ambulatory Visit: Payer: Self-pay | Admitting: Internal Medicine

## 2010-03-27 ENCOUNTER — Ambulatory Visit: Payer: Self-pay | Admitting: Oncology

## 2010-03-27 LAB — PROTIME-INR
INR: 2.9 (ref 2.00–3.50)
Protime: 34.8 Seconds — ABNORMAL HIGH (ref 10.6–13.4)

## 2010-04-07 ENCOUNTER — Ambulatory Visit: Payer: Self-pay | Admitting: Internal Medicine

## 2010-04-07 LAB — CONVERTED CEMR LAB
Fecal Occult Blood: NEGATIVE
OCCULT 2: NEGATIVE
OCCULT 3: NEGATIVE
OCCULT 4: NEGATIVE
OCCULT 5: NEGATIVE

## 2010-04-24 LAB — PROTIME-INR: Protime: 32.4 Seconds — ABNORMAL HIGH (ref 10.6–13.4)

## 2010-04-29 ENCOUNTER — Encounter: Payer: Self-pay | Admitting: Internal Medicine

## 2010-05-23 ENCOUNTER — Ambulatory Visit (HOSPITAL_BASED_OUTPATIENT_CLINIC_OR_DEPARTMENT_OTHER): Payer: Medicare Other | Admitting: Oncology

## 2010-05-29 ENCOUNTER — Ambulatory Visit
Admission: RE | Admit: 2010-05-29 | Discharge: 2010-05-29 | Payer: Self-pay | Source: Home / Self Care | Attending: Internal Medicine | Admitting: Internal Medicine

## 2010-05-29 ENCOUNTER — Other Ambulatory Visit: Payer: Self-pay | Admitting: Internal Medicine

## 2010-05-29 LAB — CBC WITH DIFFERENTIAL/PLATELET
Basophils Absolute: 0.2 10*3/uL — ABNORMAL HIGH (ref 0.0–0.1)
Basophils Relative: 1.9 % (ref 0.0–3.0)
Eosinophils Absolute: 0.2 10*3/uL (ref 0.0–0.7)
Eosinophils Relative: 2.8 % (ref 0.0–5.0)
HCT: 28.6 % — ABNORMAL LOW (ref 36.0–46.0)
Hemoglobin: 9.4 g/dL — ABNORMAL LOW (ref 12.0–15.0)
Lymphocytes Relative: 12.2 % (ref 12.0–46.0)
Lymphs Abs: 1 10*3/uL (ref 0.7–4.0)
MCHC: 32.8 g/dL (ref 30.0–36.0)
MCV: 79.2 fl (ref 78.0–100.0)
Monocytes Absolute: 0.6 10*3/uL (ref 0.1–1.0)
Monocytes Relative: 7.6 % (ref 3.0–12.0)
Neutro Abs: 6.1 10*3/uL (ref 1.4–7.7)
Neutrophils Relative %: 75.5 % (ref 43.0–77.0)
Platelets: 372 10*3/uL (ref 150.0–400.0)
RBC: 3.61 Mil/uL — ABNORMAL LOW (ref 3.87–5.11)
RDW: 17.8 % — ABNORMAL HIGH (ref 11.5–14.6)
WBC: 8 10*3/uL (ref 4.5–10.5)

## 2010-05-29 LAB — SEDIMENTATION RATE: Sed Rate: 33 mm/hr — ABNORMAL HIGH (ref 0–22)

## 2010-05-29 LAB — HEMOGLOBIN A1C: Hgb A1c MFr Bld: 6.5 % (ref 4.6–6.5)

## 2010-05-30 ENCOUNTER — Encounter: Payer: Self-pay | Admitting: Internal Medicine

## 2010-05-30 LAB — IBC PANEL
Iron: 28 ug/dL — ABNORMAL LOW (ref 42–145)
Saturation Ratios: 5.1 % — ABNORMAL LOW (ref 20.0–50.0)
Transferrin: 389.5 mg/dL — ABNORMAL HIGH (ref 212.0–360.0)

## 2010-05-30 LAB — HEPATIC FUNCTION PANEL
ALT: 15 U/L (ref 0–35)
AST: 20 U/L (ref 0–37)
Albumin: 3.4 g/dL — ABNORMAL LOW (ref 3.5–5.2)
Alkaline Phosphatase: 87 U/L (ref 39–117)
Bilirubin, Direct: 0.1 mg/dL (ref 0.0–0.3)
Total Bilirubin: 0.5 mg/dL (ref 0.3–1.2)
Total Protein: 6.4 g/dL (ref 6.0–8.3)

## 2010-05-30 LAB — CBC WITH DIFFERENTIAL/PLATELET
BASO%: 1 % (ref 0.0–2.0)
Basophils Absolute: 0.1 10*3/uL (ref 0.0–0.1)
EOS%: 2.5 % (ref 0.0–7.0)
Eosinophils Absolute: 0.2 10*3/uL (ref 0.0–0.5)
HCT: 32 % — ABNORMAL LOW (ref 34.8–46.6)
HGB: 9.8 g/dL — ABNORMAL LOW (ref 11.6–15.9)
LYMPH%: 11.8 % — ABNORMAL LOW (ref 14.0–49.7)
MCH: 24.7 pg — ABNORMAL LOW (ref 25.1–34.0)
MCHC: 30.6 g/dL — ABNORMAL LOW (ref 31.5–36.0)
MCV: 80.8 fL (ref 79.5–101.0)
MONO#: 0.6 10*3/uL (ref 0.1–0.9)
MONO%: 6.5 % (ref 0.0–14.0)
NEUT#: 7.1 10*3/uL — ABNORMAL HIGH (ref 1.5–6.5)
NEUT%: 78.2 % — ABNORMAL HIGH (ref 38.4–76.8)
Platelets: 317 10*3/uL (ref 145–400)
RBC: 3.96 10*6/uL (ref 3.70–5.45)
RDW: 16.5 % — ABNORMAL HIGH (ref 11.2–14.5)
WBC: 9 10*3/uL (ref 3.9–10.3)
lymph#: 1.1 10*3/uL (ref 0.9–3.3)
nRBC: 0 % (ref 0–0)

## 2010-05-30 LAB — BASIC METABOLIC PANEL
BUN: 24 mg/dL — ABNORMAL HIGH (ref 6–23)
CO2: 26 mEq/L (ref 19–32)
Calcium: 9.3 mg/dL (ref 8.4–10.5)
Chloride: 107 mEq/L (ref 96–112)
Creatinine, Ser: 1.5 mg/dL — ABNORMAL HIGH (ref 0.4–1.2)
GFR: 36.54 mL/min — ABNORMAL LOW (ref >60.00)
Glucose, Bld: 91 mg/dL (ref 70–99)
Potassium: 5.5 mEq/L — ABNORMAL HIGH (ref 3.5–5.1)
Sodium: 141 mEq/L (ref 135–145)

## 2010-05-30 LAB — PROTIME-INR
INR: 2.9 (ref 2.00–3.50)
Protime: 34.8 Seconds — ABNORMAL HIGH (ref 10.6–13.4)

## 2010-05-30 LAB — TSH: TSH: 2.48 u[IU]/mL (ref 0.35–5.50)

## 2010-06-04 ENCOUNTER — Ambulatory Visit
Admission: RE | Admit: 2010-06-04 | Discharge: 2010-06-04 | Payer: Self-pay | Source: Home / Self Care | Attending: Internal Medicine | Admitting: Internal Medicine

## 2010-06-15 ENCOUNTER — Encounter: Payer: Self-pay | Admitting: General Surgery

## 2010-06-16 ENCOUNTER — Encounter: Payer: Self-pay | Admitting: General Surgery

## 2010-06-24 NOTE — Letter (Signed)
[facility]   Imported By: Lennie Odor 08/22/2009 15:15:35  _____________________________________________________________________  External Attachment:    Type:   Image     Comment:   External Document

## 2010-06-24 NOTE — Miscellaneous (Signed)
Summary: [facility]  [facility]   Imported By: Lester Powers 07/01/2009 09:57:41  _____________________________________________________________________  External Attachment:    Type:   Image     Comment:   External Document

## 2010-06-24 NOTE — Progress Notes (Signed)
Summary: Refill--Enalapril  Phone Note Refill Request Message from:  Fax from Pharmacy on November 05, 2009 10:29 AM  Refills Requested: Medication #1:  ENALAPRIL MALEATE 20 MG TABS 1 once daily   Notes: [facility] Initial call taken by: Lucious Groves,  November 05, 2009 10:29 AM    Prescriptions: ENALAPRIL MALEATE 20 MG TABS (ENALAPRIL MALEATE) 1 once daily  #90 x 3   Entered by:   Lucious Groves   Authorized by:   Tresa Garter MD   Signed by:   Lucious Groves on 11/05/2009   Method used:   Faxed to ...       [facility] (mail-order)       [address]       [place], [place]  [address]       Ph: [phone]       Fax: [phone]   RxID:   [id number]

## 2010-06-24 NOTE — Assessment & Plan Note (Signed)
Summary: 3 MO ROV /NWS   Vital Signs:  Patient profile:   75 year old female Height:      66 inches Weight:      227 pounds BMI:     36.77 Temp:     97.1 degrees F oral Pulse rate:   80 / minute Pulse rhythm:   regular Resp:     16 per minute BP sitting:   140 / 90  (left arm) Cuff size:   large  Vitals Entered By: Lanier Prude, CMA(AAMA) (March 04, 2010 1:43 PM) CC: 3 mo f/u Is Patient Diabetic? Yes Comments pt is not taking Colchicine or Prednisone. Please remove from her list   Primary Care Provider:  Dr. Posey Rea  CC:  3 mo f/u.  History of Present Illness: The patient presents for a follow up of hypertension, gout, vasculitis, hyperlipidemia   Current Medications (verified): 1)  Tramadol Hcl 50 Mg  Tabs (Tramadol Hcl) .[name].. 1or2 Two Times A Day  As Needed 2)  Vitamin D 400 Unit Caps (Cholecalciferol) .... Take 1 By Mouth Monday, Wednesday, and Fridays 3)  Tranxene-T 7.5 Mg  Tabs (Clorazepate Dipotassium) .... At Bedtime Prn 4)  Prilosec Otc 20 Mg Tbec (Omeprazole Magnesium) .... Qd 5)  Allopurinol 300 Mg Tabs (Allopurinol) .... Take 1 Tab Qd 6)  Colchicine 0.6 Mg  Tabs (Colchicine) .... Take 1 By Mouth On Mondays, Wednesdays, and Fridays 7)  Cpap 10 Cwp .... Ahc 8)  Synthroid 137 Mcg Tabs (Levothyroxine Sodium) .[name].. 1 By Mouth Qd 9)  Enalapril Maleate 20 Mg Tabs (Enalapril Maleate) .[name].. 1 Once Daily 10)  Budeprion Sr 150 Mg Xr12h-Tab (Bupropion Hcl) .... Take 1 Two Times A Day 11)  Coumadin 5 Mg Tabs (Warfarin Sodium) .... Take 1/2 By Mouth Once Daily Except M, W, F Take 1 By Mouth Once Daily 12)  Prednisone 10 Mg Tabs (Prednisone) .[name].. 1 By Mouth Once Daily Pc  Allergies (verified): 1)  ! Lipitor 2)  Welchol (Colesevelam Hcl)  Past History:  Past Medical History: Last updated: 10/17/2009 Vasculitis Elev. 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    Physical Exam  General:  NAD and overweight-appearing.   Ears:  External ear exam shows no significant lesions or deformities.  Otoscopic examination reveals clear canals, tympanic membranes are intact bilaterally without bulging, retraction, inflammation or discharge. Hearing is grossly normal bilaterally. Mouth:  Oral mucosa and oropharynx without lesions or exudates.  Teeth in good repair. Lungs:  clear bilaterally to auscultation and percussion Heart:  regular rate and rhythm, S1, S2 without murmurs, rubs, gallops, or clicks Abdomen:  Bowel sounds positive,abdomen soft and non-tender without masses, organomegaly or hernias noted. Msk:  Limp, stiff and slow Lumbar-sacral spine is tender to palpation over paraspinal muscles and painfull with the ROM  Gouty joints on fingers Using a cane Extremities:  1+ left pedal edema and trace right pedal edema.   Neurologic:  alert & oriented X3, cranial nerves II-XII intact, and strength normal in all extremities.   Skin:  aging changes Psych:  Oriented X3.  Impression & Recommendations:  Problem # 1:  ANEMIA, NORMOCYTIC (ICD-285.9) Assessment Deteriorated  Orders: [facility] Hemoccult Cards #3 (take home) (Hem cards #3)  Hgb: 9.7 (02/26/2010)   Hct: 29.3 (02/26/2010)   Platelets: 379.0 (02/26/2010) RBC: 3.63 (02/26/2010)   RDW: 17.5 (02/26/2010)   WBC: 8.2 (02/26/2010) MCV: 80.5 (02/26/2010)   MCHC: 33.2 (02/26/2010) Iron: 61 (12/17/2006)   % Sat: 15.4 (12/17/2006) B12: > 1500 pg/mL (12/17/2006)   TSH: 0.91 (08/10/2008)  Problem # 2:   BREAST CANCER; OTHER (ICD-174.8) Assessment: Unchanged Mammo was nl  Problem # 3:  HYPERTENSION (ICD-401.9) Assessment: Unchanged  Her updated medication list for this problem includes:    Enalapril Maleate 20 Mg Tabs (Enalapril maleate) .[name]... 1 once daily  Problem # 4:  BREAST CANCER, HX OF (ICD-V10.3) Assessment: Unchanged  Problem # 5:  GOUT (ICD-274.9) Assessment: Improved  The following medications were removed from the medication list:    Colchicine 0.6 Mg Tabs (Colchicine) .[name]... Take 1 by mouth on mondays, wednesdays, and fridays Her updated medication list for this problem includes:    Allopurinol 300 Mg Tabs (Allopurinol) .[name]... Take 1 tab once daily M-W-F  Problem # 6:  LOW BACK PAIN (ICD-724.2) Assessment: Unchanged  Her updated medication list for this problem includes:    Tramadol Hcl 50 Mg Tabs (Tramadol hcl) .[name]... 1or2 two times a day  as needed  Problem # 7:  POLYMYALGIA RHEUMATICA (ICD-725) Assessment: Unchanged Rheum appt is pending   Complete Medication List: 1)  Tramadol Hcl 50 Mg Tabs (Tramadol hcl) .[name].. 1or2 two times a day  as needed 2)  Vitamin D 400 Unit Caps (Cholecalciferol) .... Take 1 by mouth monday, wednesday, and fridays 3)  Tranxene-t 7.5 Mg Tabs (Clorazepate dipotassium) .... At bedtime prn 4)  Prilosec Otc 20 Mg Tbec (Omeprazole magnesium) .... Qd 5)  Allopurinol 300 Mg Tabs (Allopurinol) .... Take 1 tab qd 6)  Cpap 10 Cwp  .... Ahc 7)  Synthroid 137 Mcg Tabs (Levothyroxine sodium) .[name].. 1 by mouth qd 8)  Enalapril Maleate 20 Mg Tabs (Enalapril maleate) .[name].. 1 once daily 9)  Budeprion Sr 150 Mg Xr12h-tab (Bupropion hcl) .... Take 1 two times a day 10)  Coumadin 5 Mg Tabs (Warfarin sodium) .... Take 1/2 by mouth once daily except m, w, f take 1 by mouth once daily  Other Orders: Flu Vaccine 64yrs + MEDICARE PATIENTS (F6213) Administration Flu vaccine - MCR (Y8657)  Patient Instructions: 1)  Please schedule a follow-up appointment in 3  months. 2)  BMP prior to visit, ICD-9: 3)  Hepatic Panel prior to visit, ICD-9: 4)  TSH prior to visit, ICD-9: 5)  CBC w/ Diff prior to visit, ICD-9: 6)  HbgA1C prior to visit, ICD-9: 995.20 280.0 7)  ESR, Iron/TIBC   .lbmedflu   Flu Vaccine Consent Questions     Do you have a history of severe allergic reactions to this vaccine? no    Any prior history of allergic reactions to egg and/or gelatin? no    Do you have a sensitivity to the preservative Thimersol? no    Do you have a past history of Guillan-Barre Syndrome? no    Do you currently have an acute febrile illness? no    Have you ever had a severe reaction to latex? no    Vaccine information given and explained to patient? yes    Are you currently pregnant? no    Lot Number:[id number]   Exp Date:11/22/2010   Site Given  Left  Deltoid IM Lanier Prude, [facility])  March 04, 2010 2:19 PM

## 2010-06-24 NOTE — Miscellaneous (Signed)
Summary: Mammogram  Clinical Lists Changes  Observations: Added new observation of MAMMOGRAM: normal (02/24/2010 13:59)      Preventive Care Screening  Mammogram:    Date:  02/24/2010    Results:  normal

## 2010-06-24 NOTE — Letter (Signed)
Summary: [facility]  [facility]   Imported By: Lester Sisquoc 03/25/2010 10:11:08  _____________________________________________________________________  External Attachment:    Type:   Image     Comment:   External Document

## 2010-06-24 NOTE — Progress Notes (Signed)
Summary: Tramadol  Phone Note Refill Request Message from:  Fax from Pharmacy on August 05, 2009 3:40 PM  Refills Requested: Medication #1:  TRAMADOL HCL 50 MG  TABS 1or2 two times a day  prn Initial call taken by: Lucious Groves,  August 05, 2009 3:40 PM  Follow-up for Phone Call        done hardcopy to LIM side B - dahlia  Follow-up by: Corwin Levins MD,  August 05, 2009 5:45 PM  Additional Follow-up for Phone Call Additional follow up Details #1::        rx faxed to pharmacy Additional Follow-up by: Margaret Pyle, CMA,  August 06, 2009 8:10 AM    New/Updated Medications: TRAMADOL HCL 50 MG  TABS (TRAMADOL HCL) 1or2 two times a day  as needed Prescriptions: TRAMADOL HCL 50 MG  TABS (TRAMADOL HCL) 1or2 two times a day  as needed  #180 x 1   Entered and Authorized by:   Corwin Levins MD   Signed by:   Corwin Levins MD on 08/05/2009   Method used:   Print then Give to Patient   RxID:   [id number]

## 2010-06-24 NOTE — Assessment & Plan Note (Signed)
Summary: 3 MO ROV /NWS  #   Vital Signs:  Patient profile:   75 year old female Height:      66 inches (167.64 cm) Weight:      220.8 pounds (100.36 kg) O2 Sat:      95 % on Room air Temp:     97.6 degrees F (36.44 degrees C) oral Pulse rate:   67 / minute BP sitting:   130 / 62  (left arm) Cuff size:   regular  Vitals Entered By: Orlan Leavens (August 28, 2009 1:44 PM)  O2 Flow:  Room air CC: 3 MONTH FOLLOW-UP Is Patient Diabetic? Yes Did you bring your meter with you today? No Pain Assessment Patient in pain? no        Primary Care Provider:  Dr. Posey Rea  CC:  3 MONTH FOLLOW-UP.  History of Present Illness: The patient presents for a follow up of hypertension, diabetes, hyperlipidemia   Current Medications (verified): 1)  Tramadol Hcl 50 Mg  Tabs (Tramadol Hcl) .[name].. 1or2 Two Times A Day  As Needed 2)  Vitamin D 400 Unit Caps (Cholecalciferol) .... Take 1 By Mouth Monday, Wednesday, and Fridays 3)  Tranxene-T 7.5 Mg  Tabs (Clorazepate Dipotassium) .... At Bedtime Prn 4)  Prilosec Otc 20 Mg Tbec (Omeprazole Magnesium) .... Qd 5)  Allopurinol 300 Mg Tabs (Allopurinol) .... Take 1 Tab Qd 6)  Colchicine 0.6 Mg  Tabs (Colchicine) .... Take 1 By Mouth On Mondays, Wednesdays, and Fridays 7)  Cpap 10 Cwp .... Ahc 8)  Synthroid 137 Mcg Tabs (Levothyroxine Sodium) .[name].. 1 By Mouth Qd 9)  Enalapril Maleate 20 Mg Tabs (Enalapril Maleate) .[name].. 1 Once Daily 10)  Budeprion Sr 150 Mg Xr12h-Tab (Bupropion Hcl) .[name].. 1 By Mouth Once Daily 11)  Coumadin 5 Mg Tabs (Warfarin Sodium) .... Take 1/2 By Mouth Once Daily Except M, W, F Take 1 By Mouth Once Daily  Allergies (verified): 1)  ! Lipitor 2)  Welchol (Colesevelam Hcl)  Past History:  Past Medical History: Last updated: 04/23/2009 Vasculitis Elev. 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   Nose:  External nasal examination shows no deformity or inflammation. Nasal mucosa are pink and moist without lesions or exudates. Mouth:  Oral mucosa and oropharynx without lesions or exudates.  Teeth in good repair. Neck:  No deformities, masses, or tenderness noted. Lungs:  clear bilaterally to auscultation and percussion Heart:  regular rate and rhythm, S1, S2 without murmurs, rubs, gallops, or clicks Abdomen:  Bowel sounds positive,abdomen soft and non-tender without masses, organomegaly or hernias noted. Msk:  Limp, stiff and slow Lumbar-sacral spine is tender to palpation over paraspinal muscles and painfull with the ROM  Gouty joints on fingers Using a cane Extremities:  1+ left pedal edema and trace right pedal edema.   Neurologic:  alert & oriented X3, cranial nerves II-XII intact, and strength normal in all extremities.   Skin:  aging changes Psych:  Oriented X3.     Impression & Recommendations:  Problem # 1:  POLYMYALGIA RHEUMATICA (ICD-725) Assessment Improved On prescription/OTC  therapy   Problem # 2:  HYPERTENSION (ICD-401.9) Assessment: Unchanged  Her  updated medication list for this problem includes:    Enalapril Maleate 20 Mg Tabs (Enalapril maleate) .[name]... 1 once daily  BP today: 130/62 Prior BP: 130/60 (05/28/2009)  Prior 10 Yr Risk Heart Disease: Not enough information (01/31/2009)  Labs Reviewed: K+: 4.3 (08/21/2009) Creat: : 1.4 (08/21/2009)   Chol: 226 (12/06/2008)   HDL: 36.50 (12/06/2008)   LDL: DEL (03/17/2007)   TG: 175.0 (12/06/2008)  Problem # 3:  EDEMA (ICD-782.3) Assessment: Improved  Problem # 4:  COPD, MILD (ICD-496) Assessment: Unchanged  Problem  # 5:  RENAL INSUFFICIENCY (ICD-588.9) Assessment: Unchanged The labs were reviewed with the patient.   Complete Medication List: 1)  Tramadol Hcl 50 Mg Tabs (Tramadol hcl) .[name].. 1or2 two times a day  as needed 2)  Vitamin D 400 Unit Caps (Cholecalciferol) .... Take 1 by mouth monday, wednesday, and fridays 3)  Tranxene-t 7.5 Mg Tabs (Clorazepate dipotassium) .... At bedtime prn 4)  Prilosec Otc 20 Mg Tbec (Omeprazole magnesium) .... Qd 5)  Allopurinol 300 Mg Tabs (Allopurinol) .... Take 1 tab qd 6)  Colchicine 0.6 Mg Tabs (Colchicine) .... Take 1 by mouth on mondays, wednesdays, and fridays 7)  Cpap 10 Cwp  .... Ahc 8)  Synthroid 137 Mcg Tabs (Levothyroxine sodium) .[name].. 1 by mouth qd 9)  Enalapril Maleate 20 Mg Tabs (Enalapril maleate) .[name].. 1 once daily 10)  Budeprion Sr 150 Mg Xr12h-tab (Bupropion hcl) .... Take 1 two times a day 11)  Coumadin 5 Mg Tabs (Warfarin sodium) .... Take 1/2 by mouth once daily except m, w, f take 1 by mouth once daily   Patient Instructions: 1)  Please schedule a follow-up appointment in 3 months. 2)  BMP prior to visit, ICD-9: 3)  Hepatic Panel prior to visit, ICD-9: 4)  Lipid Panel prior to visit, ICD-9:99520  790 29 5)  HbgA1C prior to visit, ICD-9: 6)  ESR Prescriptions: COLCHICINE 0.6 MG  TABS (COLCHICINE) take 1 by mouth on mondays, wednesdays, and fridays  #12 x 12   Entered and Authorized by:   Tresa Garter MD   Signed by:   Tresa Garter MD on 08/28/2009   Method used:   [facility]        [facility]  [address] [phone]* (retail)       [address]       [place], [place]  [id number]       Ph: [phone] or [phone]       Fax: [phone]   RxID:   [id number]

## 2010-06-24 NOTE — Assessment & Plan Note (Signed)
Summary: 6 months/apc   Primary Provider/Referring Provider:  Dr. Posey Rea  CC:  follow up visit.  History of Present Illness: 04/24/08- OSA, COPD Compliant with cpap. Fully compliaant, using cpap every night. Mild insomnia. No longer has daytime somnolence, rarely naps. Husband describes occasional central apnea. She uses Advair as needed. Declines flu vaccine.   April 23, 2009- OSA, COPD...................................Marland Kitchenhusband here Since last here had DVT left leg, without PE, treated with coumadin/ Dr Truett Perna. Leg had swollen. Now feels better. Breathing is good, without chest pain or blood. Also has had right mastectomy for Ca and now on Arimidex.Nodes negative. Declines Flu vax- first one made her very ill years ago.  Oct 17, 2009- OSA, COPD, Hx DVT...............................Marland KitchenHusband here Since last here had right mastectomy, now on tamoxifen?- Dr Truett Perna. Also new dx Iritis. Continues warfarin for hx DVT. Continues CPAP 10 every night, all night. Had some isues with humidifier condensation which we worked on.  COPD- Wakes minimal chest congestion in AMs, clears easily. Takes antihistamine if needed.        Preventive Screening-Counseling & Management  Alcohol-Tobacco     Smoking Status: never  Current Medications (verified): 1)  Tramadol Hcl 50 Mg  Tabs (Tramadol Hcl) .[name].. 1or2 Two Times A Day  As Needed 2)  Vitamin D 400 Unit Caps (Cholecalciferol) .... Take 1 By Mouth Monday, Wednesday, and Fridays 3)  Tranxene-T 7.5 Mg  Tabs (Clorazepate Dipotassium) .... At Bedtime Prn 4)  Prilosec Otc 20 Mg Tbec (Omeprazole Magnesium) .... Qd 5)  Allopurinol 300 Mg Tabs (Allopurinol) .... Take 1 Tab Qd 6)  Colchicine 0.6 Mg  Tabs (Colchicine) .... Take 1 By Mouth On Mondays, Wednesdays, and Fridays 7)  Cpap 10 Cwp .... Ahc 8)  Synthroid 137 Mcg Tabs (Levothyroxine Sodium) .[name].. 1 By Mouth Qd 9)  Enalapril Maleate 20 Mg Tabs (Enalapril Maleate) .[name].. 1 Once Daily 10)   Budeprion Sr 150 Mg Xr12h-Tab (Bupropion Hcl) .... Take 1 Two Times A Day 11)  Coumadin 5 Mg Tabs (Warfarin Sodium) .... Take 1/2 By Mouth Once Daily Except M, W, F Take 1 By Mouth Once Daily  Allergies (verified): 1)  ! Lipitor 2)  Welchol (Colesevelam Hcl)  Past History:  Family History: Last updated: 07/05/2007 Family History Hypertension  Social History: Last updated: 07/05/2007 Retired Married Never Smoked  Risk Factors: Smoking Status: never (10/17/2009)  Past Medical History: Vasculitis Elev. 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 R Breast Ca 2010/ radical mastectomy/ Arimidex/ Dr Truett Perna DVT -left leg- 9/29/ Coumadin/ Dr Truett Perna Iritis  Past Surgical History: Foot surg Bilat 2009 Right  radical mastectomy 2010/ Arimidex/ Cataract Glaucoma shunt Right mastectomy 2011-   Review of Systems      See HPI  The patient denies shortness of breath with activity, shortness of breath at rest, productive cough, non-productive cough, coughing up blood, chest pain, irregular heartbeats, acid heartburn, indigestion, loss of appetite, weight change, abdominal pain, difficulty swallowing, sore throat, tooth/dental problems, headaches, nasal congestion/difficulty breathing through nose, and sneezing.    Vital Signs:  Patient profile:   75 year old female Height:      66 inches Weight:      223 pounds BMI:     36.12 O2 Sat:      96 % on Room air Pulse rate:   70 / minute BP sitting:   112 / 74  (  left arm) Cuff size:   large  Vitals Entered By: Reynaldo Minium CMA (Oct 17, 2009 1:51 PM)  O2 Flow:  Room air  Physical Exam  Additional Exam:  General: A/Ox3; pleasant and cooperative, NAD, overweight calm and conversational NODES: no lymphadenopathy HEENT: [place]/AT, EOM- WNL, Conjuctivae- clear, PERRLA, TM-WNL,  Nose- clear, Throat- clear and wnl, Mallampati II NECK: Supple w/ fair ROM, JVD- none, normal carotid impulses w/o bruits Thyroid-  CHEST: Clear to P&A HEART: RRR, no m/g/r heard ABDOMEN: Soft and nl; QMV:HQIO, nl pulses, no edema  NEURO: Grossly intact to observation      Impression & Recommendations:  Problem # 1:  HYPERSOMNIA, ASSOCIATED WITH SLEEP APNEA (ICD-780.53) Assessment Improved Good control and compliance with CPAP 10. She will continue. She is pleased with this treatment..  Problem # 2:  COPD, MILD (ICD-496) Good control. We looked at alternatives. No changes would make her life any better.  Other Orders: Est. Patient Level IV ([address])  Patient Instructions: 1)  Please schedule a follow-up appointment in 1 year. 2)  Continue CPAP 3)  Call as needed

## 2010-06-24 NOTE — Progress Notes (Signed)
Summary: Refill--Allopurinol  Phone Note Refill Request Message from:  Fax from Pharmacy on November 08, 2009 4:48 PM  Refills Requested: Medication #1:  ALLOPURINOL 300 MG TABS Take 1 tab qd Initial call taken by: Lucious Groves,  November 08, 2009 4:48 PM    Prescriptions: ALLOPURINOL 300 MG TABS (ALLOPURINOL) Take 1 tab qd  #90 x 3   Entered by:   Lucious Groves   Authorized by:   Tresa Garter MD   Signed by:   Lucious Groves on 11/08/2009   Method used:   Faxed to ...       [facility] ([facility]order)       [address]       [place], [place]  [address]       Ph: [phone]       Fax: [phone]   RxID:   [id number]

## 2010-06-24 NOTE — Letter (Signed)
Summary: [facility]  [facility] Supplies/[facility]   Imported By: Sherian Rein 01/20/2010 07:40:50  _____________________________________________________________________  External Attachment:    Type:   Image     Comment:   External Document

## 2010-06-24 NOTE — Progress Notes (Signed)
Summary: CLORAZEPATE  Phone Note Other Incoming Call back at [facility]: [facility] [phone] Summary of Call: REFILL  ON CLORAZEPATE 7.5MG  --OK X 2 REFILLS Initial call taken by: Tora Perches,  August 09, 2009 5:05 PM    Prescriptions: TRANXENE-T 7.5 MG  TABS (CLORAZEPATE DIPOTASSIUM) at bedtime prn  #90 x 2   Entered by:   Tora Perches   Authorized by:   Tresa Garter MD   Signed by:   Tora Perches on 08/09/2009   Method used:   Telephoned to ...       [facility]  [address] [phone]* (retail)       [address]       [place], [place]  [id number]       Ph: [phone] or [phone]       Fax: [phone]   RxID:   [id number]

## 2010-06-24 NOTE — Letter (Signed)
[facility]   Imported By: Wendy Cole 12/24/2009 08:47:11  _____________________________________________________________________  External Attachment:    Type:   Image     Comment:   External Document

## 2010-06-24 NOTE — Assessment & Plan Note (Signed)
Summary: 3 mo rov /nws   Vital Signs:  Patient profile:   75 year old female Height:      66 inches (167.64 cm) Weight:      222 pounds (100.91 kg) BMI:     35.96 O2 Sat:      94 % on Room air Temp:     98.2 degrees F (36.78 degrees C) oral Pulse rate:   70 / minute Pulse rhythm:   regular Resp:     16 per minute BP sitting:   114 / 70  (left arm) Cuff size:   regular  Vitals Entered By: Lanier Prude, CMA(AAMA) (November 27, 2009 1:48 PM)  O2 Flow:  Room air CC: 3 mo f/u Is Patient Diabetic? No   Primary Care Provider:  Dr. Posey Rea  CC:  3 mo f/u.  History of Present Illness: The patient presents for a follow up of hypertension, diabetes, hyperlipidemia, CTD, DVT. She started to have pounding temp HAs on R and L several times; c/o pain in neck, shoulders and hips over past 2-3 wks. Her ophth said she had iritis. No sweats, fever or vision loss - just saw her ophth.   Preventive Screening-Counseling & Management  Alcohol-Tobacco     Smoking Status: quit  Current Medications (verified): 1)  Tramadol Hcl 50 Mg  Tabs (Tramadol Hcl) .[name].. 1or2 Two Times A Day  As Needed 2)  Vitamin D 400 Unit Caps (Cholecalciferol) .... Take 1 By Mouth Monday, Wednesday, and Fridays 3)  Tranxene-T 7.5 Mg  Tabs (Clorazepate Dipotassium) .... At Bedtime Prn 4)  Prilosec Otc 20 Mg Tbec (Omeprazole Magnesium) .... Qd 5)  Allopurinol 300 Mg Tabs (Allopurinol) .... Take 1 Tab Qd 6)  Colchicine 0.6 Mg  Tabs (Colchicine) .... Take 1 By Mouth On Mondays, Wednesdays, and Fridays 7)  Cpap 10 Cwp .... Ahc 8)  Synthroid 137 Mcg Tabs (Levothyroxine Sodium) .[name].. 1 By Mouth Qd 9)  Enalapril Maleate 20 Mg Tabs (Enalapril Maleate) .[name].. 1 Once Daily 10)  Budeprion Sr 150 Mg Xr12h-Tab (Bupropion Hcl) .... Take 1 Two Times A Day 11)  Coumadin 5 Mg Tabs (Warfarin Sodium) .... Take 1/2 By Mouth Once Daily Except M, W, F Take 1 By Mouth Once Daily  Allergies (verified): 1)  ! Lipitor 2)  Welchol (Colesevelam  Hcl)  Past History:  Past Medical History: Last updated: 10/17/2009 Vasculitis Elev. 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 Family History Hypertension  Social History: Smoking Status:  quit  Review of Systems       The patient complains of difficulty walking.  The patient denies anorexia, fever, weight loss, weight gain, chest pain, dyspnea on exertion, abdominal pain, hematochezia, muscle weakness, and depression.    Physical Exam  General:  NAD and overweight-appearing.   Head:  normocephalic and atraumatic. NT, no firm vessels  Eyes:  No corneal or conjunctival inflammation noted. EOMI. Perrla. Ears:  External ear exam shows no significant  lesions or deformities.  Otoscopic examination reveals clear canals, tympanic membranes are intact bilaterally without bulging, retraction, inflammation or discharge. Hearing is grossly normal bilaterally. Nose:  External nasal examination shows no deformity or inflammation. Nasal mucosa are pink and moist without lesions or exudates. Mouth:  Oral mucosa and oropharynx without lesions or exudates.  Teeth in good repair. Neck:  No deformities, masses, or tenderness noted. Lungs:  clear bilaterally to auscultation and percussion Heart:  regular rate and rhythm, S1, S2 without murmurs,  rubs, gallops, or clicks Abdomen:  Bowel sounds positive,abdomen soft and non-tender without masses, organomegaly or hernias noted. Msk:  Limp, stiff and slow Lumbar-sacral spine is tender to palpation over paraspinal muscles and painfull with the ROM  Gouty joints on fingers Using a cane Pulses:  R posterior tibial decreased, R dorsalis pedis decreased, L posterior tibial absent, and L dorsalis pedis absent.   Extremities:  1+ left pedal edema and trace right pedal edema.   Neurologic:  alert & oriented X3, cranial nerves II-XII intact, and strength normal in all extremities.   Skin:  aging changes Psych:  Oriented X3.     Impression & Recommendations:  Problem # 1:  POLYMYALGIA RHEUMATICA (ICD-725) Assessment Deteriorated Start Predn Risks vs benefits and controversies of a long term steroid use were discussed.   Problem # 2:  HEADACHE (ICD-784.0) related to #1 Assessment: New  On prescription drug  therapy   Her updated medication list for this problem includes:    Tramadol Hcl 50 Mg Tabs (Tramadol hcl) .[name]... 1or2 two times a day  as needed  Problem # 3:  LIVER FUNCTION TESTS, ABNORMAL, HX OF (ICD-V12.2) Assessment: Improved The labs were reviewed with the patient.   Problem # 4:  HYPERTENSION (ICD-401.9) Assessment: Improved  Her updated medication list for this problem includes:    Enalapril Maleate 20 Mg Tabs (Enalapril maleate) .[name]... 1 once daily  BP today: 114/70 Prior BP: 112/74 (10/17/2009)  Prior 10 Yr Risk Heart Disease: Not enough information (01/31/2009)  Labs Reviewed: K+: 5.4 (11/20/2009) Creat: : 1.3 (11/20/2009)   Chol: 234 (11/20/2009)   HDL: 37.70 (11/20/2009)   LDL: DEL (03/17/2007)   TG: 238.0 (11/20/2009)  Problem # 5:  EDEMA (ICD-782.3) Assessment: Improved  Problem # 6:  RENAL INSUFFICIENCY (ICD-588.9) Assessment: Unchanged  Problem # 7:  GOUT (ICD-274.9) Assessment: Unchanged  Problem # 8:  LOW BACK PAIN (ICD-724.2) due to  #1 Assessment: Deteriorated  Her updated medication list for this problem includes:    Tramadol Hcl 50 Mg Tabs (Tramadol hcl) .[name]... 1or2 two times a day  as needed  Complete Medication List: 1)  Tramadol Hcl 50 Mg Tabs (Tramadol hcl) .[name].. 1or2 two times a day  as needed 2)  Vitamin D 400 Unit Caps (Cholecalciferol) .... Take 1 by mouth monday, wednesday, and fridays 3)  Tranxene-t 7.5 Mg Tabs (Clorazepate dipotassium) .... At bedtime prn 4)  Prilosec Otc 20 Mg Tbec (Omeprazole magnesium) .... Qd 5)  Allopurinol 300 Mg Tabs (Allopurinol) .... Take 1 tab qd 6)  Colchicine 0.6 Mg Tabs (Colchicine) .... Take 1 by mouth on mondays, wednesdays, and fridays 7)  Cpap 10 Cwp  .... Ahc 8)  Synthroid 137 Mcg Tabs (Levothyroxine sodium) .[name].. 1 by mouth qd 9)  Enalapril Maleate 20 Mg Tabs (Enalapril maleate) .[name].. 1 once daily 10)  Budeprion Sr 150 Mg Xr12h-tab (Bupropion hcl) .... Take 1 two times a day 11)  Coumadin 5 Mg Tabs (Warfarin sodium) .... Take 1/2 by  mouth once daily except m, w, f take 1 by mouth once daily 12)  Prednisone 10 Mg Tabs (Prednisone) .[name].. 1 by mouth once daily pc  Patient Instructions: 1)  Please schedule a follow-up appointment in 3 months. Call me if not much better in 1-2 wk 2)  BMP prior to visit, ICD-9: 3)  Hepatic Panel prior to visit, ICD-9: 4)  CBC w/ Diff prior to visit, ICD-9: 5)  HbgA1C prior to visit, ICD-9: 6)  ESR 250.00 Prescriptions: PREDNISONE 10 MG TABS (PREDNISONE) 1 by mouth once daily pc  #30 x 1   Entered and Authorized by:   Tresa Garter MD   Signed by:   Tresa Garter MD on 11/27/2009   Method used:   Print then Give to Patient   RxID:   [id number]

## 2010-06-24 NOTE — Assessment & Plan Note (Signed)
Summary: 2 MO ROV /NWS   Vital Signs:  Patient profile:   75 year old female Weight:      225 pounds Temp:     98.1 degrees F oral Pulse rate:   58 / minute BP sitting:   130 / 60  (left arm)  Vitals Entered By: Tora Perches (May 28, 2009 1:38 PM) CC: f/u   Primary Care Provider:  Dr. Posey Rea  CC:  f/u.  History of Present Illness: The patient presents for a follow up of hypertension, OA, hyperlipidemia, DVT   Preventive Screening-Counseling & Management  Alcohol-Tobacco     Smoking Status: never  Current Medications (verified): 1)  Tramadol Hcl 50 Mg  Tabs (Tramadol Hcl) .[name].. 1or2 Two Times A Day  Prn 2)  Vitamin D 400 Unit Caps (Cholecalciferol) .... Take 1 By Mouth Monday, Wednesday, and Fridays 3)  Tranxene-T 7.5 Mg  Tabs (Clorazepate Dipotassium) .... At Bedtime Prn 4)  Prilosec Otc 20 Mg Tbec (Omeprazole Magnesium) .... Qd 5)  Allopurinol 300 Mg Tabs (Allopurinol) .... Take 1 Tab 3 X A Week 6)  Colchicine 0.6 Mg  Tabs (Colchicine) .... Take 1 By Mouth On Mondays, Wednesdays, and Fridays 7)  Cpap 10 Cwp .... Ahc 8)  Synthroid 137 Mcg Tabs (Levothyroxine Sodium) .[name].. 1 By Mouth Qd 9)  Enalapril Maleate 20 Mg Tabs (Enalapril Maleate) .[name].. 1 Once Daily 10)  Budeprion Sr 150 Mg Xr12h-Tab (Bupropion Hcl) .[name].. 1 By Mouth Once Daily 11)  Coumadin 5 Mg Tabs (Warfarin Sodium) .... Take 1/2 By Mouth Once Daily Except M, W, F Take 1 By Mouth Once Daily  Allergies: 1)  ! Lipitor 2)  Welchol (Colesevelam Hcl)  Past History:  Past Medical History: Last updated: 04/23/2009 Vasculitis Elev. 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  The patient denies fever, weight loss, weight gain, dyspnea on exertion, and abdominal pain.         CPAP  Physical Exam  General:  NAD and overweight-appearing.   Nose:  External nasal examination shows no deformity or inflammation. Nasal mucosa are pink and moist without lesions or exudates. Mouth:  Oral mucosa and oropharynx without lesions or exudates.  Teeth in good repair. Neck:  No deformities, masses, or tenderness noted. Lungs:  clear bilaterally to auscultation and percussion Heart:  regular rate and rhythm, S1, S2 without murmurs, rubs, gallops, or clicks Abdomen:  Bowel sounds positive,abdomen soft and non-tender without masses, organomegaly or hernias noted. Msk:  Limp, stiff and slow Lumbar-sacral spine is tender to palpation over paraspinal muscles and painfull with the ROM  Gouty joints on fingers Neurologic:  alert & oriented X3, cranial nerves II-XII intact, and strength normal in all extremities.   Skin:  aging changes Psych:  Oriented X3.     Impression & Recommendations:  Problem # 1:  HYPERTENSION (ICD-401.9) Assessment Improved  Her updated medication list for this problem includes:    Enalapril Maleate 20 Mg Tabs (Enalapril maleate) .[name]... 1 once daily The labs  were reviewed with the patient.   Problem # 2:  EDEMA (ICD-782.3) Assessment: Comment Only  Problem # 3:  GOUT (ICD-274.9) Assessment: Unchanged  Her updated medication list for this problem includes:    Allopurinol 300 Mg Tabs (Allopurinol) .[name]... Take 1 tab qd    Colchicine 0.6 Mg Tabs (Colchicine) .[name]... Take 1 by mouth on mondays, wednesdays, and fridays  Problem # 4:  DIABETES MELLITUS, TYPE II (ICD-250.00) Assessment: Comment Only  Her updated medication list for this  problem includes:    Enalapril Maleate 20 Mg Tabs (Enalapril maleate) .[name]... 1 once daily  Problem # 5:  DEEP VENOUS THROMBOPHLEBITIS, LEG, LEFT (ICD-453.40) Assessment: Comment Only On prescription therapy   Complete Medication List: 1)  Tramadol Hcl 50 Mg Tabs (Tramadol hcl) .[name].. 1or2 two times a day  prn 2)  Vitamin D 400 Unit Caps (Cholecalciferol) .... Take 1 by mouth monday, wednesday, and fridays 3)  Tranxene-t 7.5 Mg Tabs (Clorazepate dipotassium) .... At bedtime prn 4)  Prilosec Otc 20 Mg Tbec (Omeprazole magnesium) .... Qd 5)  Allopurinol 300 Mg Tabs (Allopurinol) .... Take 1 tab qd 6)  Colchicine 0.6 Mg Tabs (Colchicine) .... Take 1 by mouth on mondays, wednesdays, and fridays 7)  Cpap 10 Cwp  .... Ahc 8)  Synthroid 137 Mcg Tabs (Levothyroxine sodium) .[name].. 1 by mouth qd 9)  Enalapril Maleate 20 Mg Tabs (Enalapril maleate) .[name].. 1 once daily 10)  Budeprion Sr 150 Mg Xr12h-tab (Bupropion hcl) .[name].. 1 by mouth once daily 11)  Coumadin 5 Mg Tabs (Warfarin sodium) .... Take 1/2 by mouth once daily except m, w, f take 1 by mouth once daily  Contraindications/Deferment of Procedures/Staging:    Treatment: Pneumovax    Contraindication: N/A     Test/Procedure: FLU VAX    Reason for deferment: patient declined   Patient Instructions: 1)  Please schedule a follow-up appointment in 3 months. 2)  Use stretching exercises that I have provided (15 min. or longer every day) 3)  BMP prior to visit, ICD-9: 4)  Hepatic Panel prior to visit, ICD-9: 5)  HbgA1C prior to visit, ICD-9: 6)  Uric acid  995.20   Prescriptions: ALLOPURINOL 300 MG TABS (ALLOPURINOL) Take 1 tab qd  #30 x 12   Entered and Authorized by:   Tresa Garter MD   Signed by:   Tresa Garter MD on 05/28/2009   Method used:   Print then Give to Patient   RxID:   [id number]

## 2010-06-26 NOTE — Letter (Signed)
Summary: NP Eval,Rheumatology/[facility]  NP Eval,Rheumatology/[facility]   Imported By: Sherian Rein 05/15/2010 10:40:46  _____________________________________________________________________  External Attachment:    Type:   Image     Comment:   External Document

## 2010-06-26 NOTE — Assessment & Plan Note (Signed)
Summary: 3  mos f/u #/cd   Vital Signs:  Patient profile:   75 year old female Height:      66 inches Weight:      233 pounds BMI:     37.74 Temp:     98.2 degrees F oral Pulse rate:   80 / minute Pulse rhythm:   regular Resp:     16 per minute BP sitting:   148 / 90  (left arm) Cuff size:   large  Vitals Entered By: Lanier Prude, CMA(AAMA) (June 04, 2010 1:56 PM) CC: 3 mo f/u  Is Patient Diabetic? Yes Comments pt needs Rf on Tramadol and tranxene.  She also needs handicap placard   Primary Care Provider:  Dr. Posey Rea  CC:  3 mo f/u .  History of Present Illness: The patient presents for a follow up of hypertension, diabetes, hyperlipidemia  and anemia. She had been on iron 3 per wk OTC  Current Medications (verified): 1)  Tramadol Hcl 50 Mg  Tabs (Tramadol Hcl) .[name].. 1or2 Two Times A Day  As Needed 2)  Vitamin D 400 Unit Caps (Cholecalciferol) .... Take 1 By Mouth Monday, Wednesday, and Fridays 3)  Tranxene-T 7.5 Mg  Tabs (Clorazepate Dipotassium) .... At Bedtime Prn 4)  Prilosec Otc 20 Mg Tbec (Omeprazole Magnesium) .... Qd 5)  Allopurinol 300 Mg Tabs (Allopurinol) .... Take 1 Tab Qd 6)  Cpap 10 Cwp .... Ahc 7)  Synthroid 137 Mcg Tabs (Levothyroxine Sodium) .[name].. 1 By Mouth Qd 8)  Enalapril Maleate 20 Mg Tabs (Enalapril Maleate) .[name].. 1 Once Daily 9)  Budeprion Sr 150 Mg Xr12h-Tab (Bupropion Hcl) .... Take 1 Two Times A Day 10)  Coumadin 5 Mg Tabs (Warfarin Sodium) .... Take 1/2 By Mouth Once Daily Except M, W, F Take 1 By Mouth Once Daily  Allergies (verified): 1)  ! Lipitor 2)  Welchol (Colesevelam Hcl)  Past History:  Past Medical History: Last updated: 10/17/2009 Vasculitis Elev. 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  The patient denies chest pain, dyspnea on exertion, hemoptysis, abdominal pain, melena, hematochezia, and hematuria.         No melena or bleeds  Physical Exam  General:  NAD and overweight-appearing.   Eyes:  No corneal or conjunctival inflammation noted. EOMI. Perrla. Nose:  External nasal examination shows no deformity or inflammation. Nasal mucosa are pink and moist without lesions or exudates. Mouth:  Oral mucosa and oropharynx without lesions or exudates.  Teeth in good repair. Neck:  No deformities, masses, or tenderness noted. Lungs:  clear bilaterally to auscultation and percussion Heart:  regular rate and rhythm, S1, S2 without murmurs, rubs, gallops, or clicks Abdomen:  Bowel sounds positive,abdomen soft and non-tender without masses, organomegaly or hernias noted. Msk:  Limp, stiff and slow Lumbar-sacral spine is tender to palpation over paraspinal muscles and painfull with the ROM  Gouty joints on fingers Using a cane Extremities:  1+ left pedal edema and  trace right pedal edema.   Neurologic:  alert & oriented X3, cranial nerves II-XII intact, and strength normal in all extremities.   Skin:  aging changes Psych:  Oriented X3.     Impression & Recommendations:  Problem # 1:  ANEMIA, NORMOCYTIC (ICD-285.9) - possibly multifactorial Assessment Unchanged The labs were reviewed with the patient. Hemoccults were negative Increase Iron to once daily. She may need IV iron.  Recheck in 2 months . We will sch a GI cons w/her GI. ts: (1) BMP (METABOL)   Sodium                    141 mEq/L                   135-145   Potassium             [H]  5.5 mEq/L                   3.5-5.1   Chloride                  107 mEq/L                   96-112   Carbon Dioxide            26 mEq/L                    19-32   Glucose                   91 mg/dL                    16-10   BUN                  [H]  24 mg/dL                    9-60   Creatinine           [H]  1.5 mg/dL                   4.5-4.0   Calcium                   9.3 mg/dL                   9.8-11.9   GFR                  [L]  36.54 mL/min                >60.00  Tests: (2) Hepatic/Liver Function Panel (HEPATIC)   Total Bilirubin           0.5 mg/dL                   1.4-7.8   Direct Bilirubin          0.1 mg/dL                   2.9-5.6   Alkaline Phosphatase      87 U/L                      39-117   AST                       20 U/L  0-37   ALT                       15 U/L                      0-35   Total Protein             6.4 g/dL                    1.6-1.0   Albumin              [L]  3.4 g/dL                    9.6-0.4  Tests: (3) TSH (TSH)   FastTSH                   2.48 uIU/mL                 0.35-5.50  Tests: (4) CBC Platelet w/Diff (CBCD)   White Cell Count          8.0 K/uL                    4.5-10.5   Red Cell Count       [L]  3.61 Mil/uL                 3.87-5.11   Hemoglobin           [L]  9.4 g/dL                    54.0-98.1   Hematocrit           [L]  28.6 %                      36.0-46.0   MCV                       79.2 fl                     78.0-100.0   MCHC                      32.8 g/dL                   19.1-47.8   RDW                  [H]  17.8 %                      11.5-14.6   Platelet Count            372.0 K/uL                  150.0-400.0    Problem # 2:  BREAST CANCER; OTHER (ICD-174.8) Assessment: Improved On the regimen of medicine(s) reflected in the chart    Problem # 3:  HYPERTENSION (ICD-401.9) Assessment: Deteriorated  Her updated medication list for this problem includes:    Enalapril Maleate 20  Mg Tabs (Enalapril maleate) .[name]... 1 once daily  BP today: 148/90 Prior BP: 140/90 (03/04/2010)  Prior 10 Yr Risk Heart Disease: Not enough information (01/31/2009)  Labs Reviewed: K+: 5.5 (05/29/2010) Creat: : 1.5 (05/29/2010)   Chol: 234 (11/20/2009)   HDL: 37.70 (11/20/2009)   LDL: DEL (03/17/2007)  TG: 238.0 (11/20/2009)  Problem # 4:  EDEMA (ICD-782.3) Assessment: Unchanged On the regimen of medicine(s) reflected in the chart    Problem # 5:  GOUT (ICD-274.9) Assessment: Improved  Her updated medication list for this problem includes:    Allopurinol 300 Mg Tabs (Allopurinol) .[name]... Take 1 tab qd  Problem # 6:  HEADACHE (ICD-784.0) Assessment: Improved  Her updated medication list for this problem includes:    Tramadol Hcl 50 Mg Tabs (Tramadol hcl) .[name]... 1or2 two times a day  as needed  Problem # 7:  DIABETES MELLITUS, TYPE II (ICD-250.00) Assessment: Improved  Her updated medication list for this problem includes:    Enalapril Maleate 20 Mg Tabs (Enalapril maleate) .[name]... 1 once daily  Labs Reviewed: Creat: 1.5 (05/29/2010)    Reviewed HgBA1c results: 6.5 (05/29/2010)  6.8 (02/26/2010)  Complete Medication List: 1)  Tramadol Hcl 50 Mg Tabs (Tramadol hcl) .[name].. 1or2 two times a day  as needed 2)  Vitamin D 400 Unit Caps (Cholecalciferol) .... Take 1 by mouth monday, wednesday, and fridays 3)  Tranxene-t 7.5 Mg Tabs (Clorazepate dipotassium) .... At bedtime prn 4)  Prilosec Otc 20 Mg Tbec (Omeprazole magnesium) .... Qd 5)  Allopurinol 300 Mg Tabs (Allopurinol) .... Take 1 tab qd 6)  Cpap 10 Cwp  .... Ahc 7)  Synthroid 137 Mcg Tabs (Levothyroxine sodium) .[name].. 1 by mouth qd 8)  Enalapril Maleate 20 Mg Tabs (Enalapril maleate) .[name].. 1 once daily 9)  Budeprion Sr 150 Mg Xr12h-tab (Bupropion hcl) .... Take 1 two times a day 10)  Coumadin 5 Mg Tabs (Warfarin sodium) .... Take 1/2 by mouth once daily except m, w, f take 1 by mouth once daily 11)  Arimidex 1 Mg Tabs  (Anastrozole) .[name].. 1 by mouth qd  Patient Instructions: 1)  Please schedule a follow-up appointment in 2 months. 2)  BMP prior to visit, ICD-9: 3)  CBC w/ Diff prior to visit, ICD-9: 4)  Iron/TIBC 280.9 Prescriptions: TRANXENE-T 7.5 MG  TABS (CLORAZEPATE DIPOTASSIUM) at bedtime prn  #90 x 2   Entered and Authorized by:   Tresa Garter MD   Signed by:   Tresa Garter MD on 06/04/2010   Method used:   Print then Give to Patient   RxID:   [id number] TRAMADOL HCL 50 MG  TABS (TRAMADOL HCL) 1or2 two times a day  as needed  #180 x 1   Entered and Authorized by:   Tresa Garter MD   Signed by:   Tresa Garter MD on 06/04/2010   Method used:   Print then Give to Patient   RxID:   [id number]    Orders Added: 1)  Est. Patient Level IV [64403]

## 2010-06-27 ENCOUNTER — Encounter: Payer: Self-pay | Admitting: Internal Medicine

## 2010-06-27 ENCOUNTER — Ambulatory Visit: Payer: PRIVATE HEALTH INSURANCE | Admitting: Oncology

## 2010-06-27 DIAGNOSIS — C50919 Malignant neoplasm of unspecified site of unspecified female breast: Secondary | ICD-10-CM

## 2010-06-27 DIAGNOSIS — I82409 Acute embolism and thrombosis of unspecified deep veins of unspecified lower extremity: Secondary | ICD-10-CM

## 2010-06-27 DIAGNOSIS — N289 Disorder of kidney and ureter, unspecified: Secondary | ICD-10-CM

## 2010-06-27 LAB — CBC WITH DIFFERENTIAL/PLATELET
Basophils Absolute: 0 10*3/uL (ref 0.0–0.1)
EOS%: 4.6 % (ref 0.0–7.0)
Eosinophils Absolute: 0.3 10*3/uL (ref 0.0–0.5)
HCT: 30.4 % — ABNORMAL LOW (ref 34.8–46.6)
HGB: 9.7 g/dL — ABNORMAL LOW (ref 11.6–15.9)
MONO#: 0.6 10*3/uL (ref 0.1–0.9)
NEUT#: 5.5 10*3/uL (ref 1.5–6.5)
RDW: 17.6 % — ABNORMAL HIGH (ref 11.2–14.5)
WBC: 6.9 10*3/uL (ref 3.9–10.3)
lymph#: 0.6 10*3/uL — ABNORMAL LOW (ref 0.9–3.3)

## 2010-06-27 LAB — BASIC METABOLIC PANEL
BUN: 30 mg/dL — ABNORMAL HIGH (ref 6–23)
CO2: 25 mEq/L (ref 19–32)
Chloride: 106 mEq/L (ref 96–112)
Glucose, Bld: 160 mg/dL — ABNORMAL HIGH (ref 70–99)
Potassium: 4.9 mEq/L (ref 3.5–5.3)

## 2010-06-27 LAB — PROTIME-INR: INR: 2.6 (ref 2.00–3.50)

## 2010-06-27 NOTE — Letter (Signed)
Summary: [facility]  [facility]   Imported By: Sherian Rein 06/07/2009 10:28:19  _____________________________________________________________________  External Attachment:    Type:   Image     Comment:   External Document

## 2010-06-27 NOTE — Letter (Signed)
[facility]   Imported By: Lester [place] 10/10/2009 12:45:10  _____________________________________________________________________  External Attachment:    Type:   Image     Comment:   External Document

## 2010-07-02 NOTE — Letter (Signed)
Summary: [facility]  [facility]   Imported By: Lennie Odor 06/24/2010 14:34:15  _____________________________________________________________________  External Attachment:    Type:   Image     Comment:   External Document

## 2010-07-25 ENCOUNTER — Encounter (HOSPITAL_BASED_OUTPATIENT_CLINIC_OR_DEPARTMENT_OTHER): Payer: Medicare Other | Admitting: Oncology

## 2010-07-25 ENCOUNTER — Other Ambulatory Visit: Payer: Self-pay | Admitting: Oncology

## 2010-07-25 DIAGNOSIS — Z7901 Long term (current) use of anticoagulants: Secondary | ICD-10-CM

## 2010-07-25 DIAGNOSIS — I82409 Acute embolism and thrombosis of unspecified deep veins of unspecified lower extremity: Secondary | ICD-10-CM

## 2010-07-25 DIAGNOSIS — N289 Disorder of kidney and ureter, unspecified: Secondary | ICD-10-CM

## 2010-07-25 LAB — PROTIME-INR
INR: 2.2 (ref 2.00–3.50)
Protime: 26.4 Seconds — ABNORMAL HIGH (ref 10.6–13.4)

## 2010-07-31 ENCOUNTER — Encounter (INDEPENDENT_AMBULATORY_CARE_PROVIDER_SITE_OTHER): Payer: Self-pay | Admitting: *Deleted

## 2010-07-31 ENCOUNTER — Other Ambulatory Visit: Payer: Medicare Other

## 2010-07-31 ENCOUNTER — Other Ambulatory Visit: Payer: Self-pay | Admitting: Internal Medicine

## 2010-07-31 DIAGNOSIS — D649 Anemia, unspecified: Secondary | ICD-10-CM

## 2010-07-31 DIAGNOSIS — I10 Essential (primary) hypertension: Secondary | ICD-10-CM

## 2010-07-31 LAB — BASIC METABOLIC PANEL
BUN: 33 mg/dL — ABNORMAL HIGH (ref 6–23)
Chloride: 106 mEq/L (ref 96–112)
Creatinine, Ser: 1.5 mg/dL — ABNORMAL HIGH (ref 0.4–1.2)

## 2010-07-31 LAB — IBC PANEL
Iron: 51 ug/dL (ref 42–145)
Transferrin: 348.7 mg/dL (ref 212.0–360.0)

## 2010-07-31 LAB — CBC WITH DIFFERENTIAL/PLATELET
Basophils Absolute: 0.1 10*3/uL (ref 0.0–0.1)
Eosinophils Absolute: 0.3 10*3/uL (ref 0.0–0.7)
Eosinophils Relative: 3.7 % (ref 0.0–5.0)
MCHC: 32.7 g/dL (ref 30.0–36.0)
MCV: 83.3 fl (ref 78.0–100.0)
Monocytes Absolute: 0.6 10*3/uL (ref 0.1–1.0)
Neutrophils Relative %: 78.8 % — ABNORMAL HIGH (ref 43.0–77.0)
Platelets: 316 10*3/uL (ref 150.0–400.0)
WBC: 8.3 10*3/uL (ref 4.5–10.5)

## 2010-08-06 ENCOUNTER — Encounter: Payer: Self-pay | Admitting: Internal Medicine

## 2010-08-06 ENCOUNTER — Ambulatory Visit (INDEPENDENT_AMBULATORY_CARE_PROVIDER_SITE_OTHER): Payer: Medicare Other | Admitting: Internal Medicine

## 2010-08-06 DIAGNOSIS — I10 Essential (primary) hypertension: Secondary | ICD-10-CM

## 2010-08-06 DIAGNOSIS — C50919 Malignant neoplasm of unspecified site of unspecified female breast: Secondary | ICD-10-CM

## 2010-08-06 DIAGNOSIS — E119 Type 2 diabetes mellitus without complications: Secondary | ICD-10-CM

## 2010-08-12 NOTE — Assessment & Plan Note (Signed)
Summary: 2 MO FU  / NWS   Vital Signs:  Patient profile:   75 year old female Height:      66 inches Weight:      235 pounds BMI:     38.07 Temp:     98.3 degrees F oral Pulse rate:   80 / minute Pulse rhythm:   regular Resp:     16 per minute BP sitting:   144 / 88  (left arm) Cuff size:   large  Vitals Entered By: Lanier Prude, CMA(AAMA) (August 06, 2010 1:34 PM) CC: 2 mo f/u Is Patient Diabetic? Yes   Primary Care Provider:  Dr. Posey Rea  CC:  2 mo f/u.  History of Present Illness: The patient presents for a follow up of hypertension, diabetes, hyperlipidemia   Current Medications (verified): 1)  Tramadol Hcl 50 Mg  Tabs (Tramadol Hcl) .[name].. 1or2 Two Times A Day  As Needed 2)  Vitamin D 400 Unit Caps (Cholecalciferol) .... Take 1 By Mouth Monday, Wednesday, and Fridays 3)  Tranxene-T 7.5 Mg  Tabs (Clorazepate Dipotassium) .... At Bedtime Prn 4)  Prilosec Otc 20 Mg Tbec (Omeprazole Magnesium) .... Qd 5)  Allopurinol 300 Mg Tabs (Allopurinol) .... Take 1 Tab Qd 6)  Cpap 10 Cwp .... Ahc 7)  Synthroid 137 Mcg Tabs (Levothyroxine Sodium) .[name].. 1 By Mouth Qd 8)  Enalapril Maleate 20 Mg Tabs (Enalapril Maleate) .[name].. 1 Once Daily 9)  Budeprion Sr 150 Mg Xr12h-Tab (Bupropion Hcl) .... Take 1 Two Times A Day 10)  Coumadin 5 Mg Tabs (Warfarin Sodium) .... Take 1/2 By Mouth Once Daily Except M, W, F Take 1 By Mouth Once Daily 11)  Arimidex 1 Mg Tabs (Anastrozole) .[name].. 1 By Mouth Qd  Allergies (verified): 1)  ! Lipitor 2)  Welchol (Colesevelam Hcl)  Past History:  Past Medical History: Last updated: 10/17/2009 Vasculitis Elev. 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  The patient denies fever, dyspnea on exertion, and abdominal pain.    Physical Exam  General:  NAD and overweight-appearing.   Head:  normocephalic and atraumatic. NT, no firm vessels  Ears:  External ear exam shows no significant lesions or deformities.  Otoscopic examination reveals clear canals, tympanic membranes are intact bilaterally without bulging, retraction, inflammation or discharge. Hearing is grossly normal bilaterally. Nose:  External nasal examination shows no deformity or inflammation. Nasal mucosa are pink and moist without lesions or exudates. Mouth:  Oral mucosa and oropharynx without lesions or exudates.  Teeth in good repair. Lungs:  clear bilaterally to auscultation and percussion Heart:  regular rate and rhythm, S1, S2 without murmurs, rubs, gallops, or clicks Abdomen:  Bowel sounds positive,abdomen soft and non-tender without masses, organomegaly or hernias noted. Msk:  Limp, stiff and slow Lumbar-sacral spine is tender to palpation over paraspinal muscles and painfull with the ROM  Gouty joints on fingers Using a cane PIP with tophi Extremities:  1+ left pedal edema and trace right pedal edema.   Neurologic:  alert & oriented X3, cranial nerves II-XII intact, and strength normal in all extremities.   Skin:  aging changes  Psych:  Oriented X3.     Impression & Recommendations:  Problem # 1:  BREAST CANCER; OTHER (ICD-174.8) Assessment Improved On the regimen of medicine(s) reflected in the chart    Problem # 2:  ANEMIA, NORMOCYTIC (ICD-285.9) Assessment: Improved On the regimen of medicine(s) reflected in the chart    Problem # 3:  GOUT (ICD-274.9) Assessment: Unchanged  The following medications were removed from the medication list:    Allopurinol 300 Mg Tabs (Allopurinol) .[name]... Take 1 tab qd Her updated medication list for this problem  includes:    Uloric 80 Mg Tabs (Febuxostat) .[name]... 1 by mouth qd  Problem # 4:  DIABETES MELLITUS, TYPE II (ICD-250.00)  Her updated medication list for this problem includes:    Enalapril Maleate 20 Mg Tabs (Enalapril maleate) .[name]... 1 once daily  Problem # 5:  ANXIETY (ICD-300.00)  Her updated medication list for this problem includes:    Tranxene-t 7.5 Mg Tabs (Clorazepate dipotassium) .[name]... At bedtime prn    Budeprion Sr 150 Mg Xr12h-tab (Bupropion hcl) .[name]... Take 1 two times a day  Complete Medication List: 1)  Tramadol Hcl 50 Mg Tabs (Tramadol hcl) .[name].. 1or2 two times a day  as needed 2)  Vitamin D 400 Unit Caps (Cholecalciferol) .... Take 1 by mouth monday, wednesday, and fridays 3)  Tranxene-t 7.5 Mg Tabs (Clorazepate dipotassium) .... At bedtime prn 4)  Prilosec Otc 20 Mg Tbec (Omeprazole magnesium) .... Qd 5)  Cpap 10 Cwp  .... Ahc 6)  Synthroid 137 Mcg Tabs (Levothyroxine sodium) .[name].. 1 by mouth qd 7)  Enalapril Maleate 20 Mg Tabs (Enalapril maleate) .[name].. 1 once daily 8)  Budeprion Sr 150 Mg Xr12h-tab (Bupropion hcl) .... Take 1 two times a day 9)  Coumadin 5 Mg Tabs (Warfarin sodium) .... Take 1/2 by mouth once daily except m, w, f take 1 by mouth once daily 10)  Arimidex 1 Mg Tabs (Anastrozole) .[name].. 1 by mouth qd 11)  Uloric 80 Mg Tabs (Febuxostat) .[name].. 1 by mouth qd  Patient Instructions: 1)  Please schedule a follow-up appointment in 3 months. 2)  BMP prior to visit, ICD-9: 3)  HbgA1C prior to visit, ICD-9:250.00 4)  uric acid  274.9 5)  CBC w/ Diff prior to visit, ICD-9: 280.9 Prescriptions: ULORIC 80 MG TABS (FEBUXOSTAT) 1 by mouth qd  #90 x 3   Entered and Authorized by:   Tresa Garter MD   Signed by:   Tresa Garter MD on 08/06/2010   Method used:   Print then Give to Patient   RxID:   [id number]    Orders Added: 1)  Est. Patient Level IV [14782]

## 2010-08-19 ENCOUNTER — Other Ambulatory Visit: Payer: Self-pay | Admitting: Internal Medicine

## 2010-08-28 LAB — URINALYSIS, ROUTINE W REFLEX MICROSCOPIC
Bilirubin Urine: NEGATIVE
Hgb urine dipstick: NEGATIVE
Ketones, ur: NEGATIVE mg/dL
Specific Gravity, Urine: 1.014 (ref 1.005–1.030)
pH: 5.5 (ref 5.0–8.0)

## 2010-08-28 LAB — CBC
MCV: 90.5 fL (ref 78.0–100.0)
Platelets: 298 10*3/uL (ref 150–400)
WBC: 6.4 10*3/uL (ref 4.0–10.5)

## 2010-08-28 LAB — COMPREHENSIVE METABOLIC PANEL
AST: 19 U/L (ref 0–37)
Albumin: 3.8 g/dL (ref 3.5–5.2)
Calcium: 9.5 mg/dL (ref 8.4–10.5)
Chloride: 112 mEq/L (ref 96–112)
Creatinine, Ser: 1.22 mg/dL — ABNORMAL HIGH (ref 0.4–1.2)
GFR calc Af Amer: 53 mL/min — ABNORMAL LOW (ref >60)
Total Protein: 6.8 g/dL (ref 6.0–8.3)

## 2010-08-28 LAB — DIFFERENTIAL
Eosinophils Relative: 2 % (ref 0–5)
Lymphocytes Relative: 15 % (ref 12–46)
Lymphs Abs: 0.9 10*3/uL (ref 0.7–4.0)
Monocytes Absolute: 0.6 10*3/uL (ref 0.1–1.0)
Monocytes Relative: 9 % (ref 3–12)

## 2010-08-28 LAB — PROTIME-INR: INR: 1.04 (ref 0.00–1.49)

## 2010-08-28 LAB — URINE MICROSCOPIC-ADD ON

## 2010-08-29 ENCOUNTER — Other Ambulatory Visit: Payer: Self-pay | Admitting: Oncology

## 2010-08-29 ENCOUNTER — Encounter (HOSPITAL_BASED_OUTPATIENT_CLINIC_OR_DEPARTMENT_OTHER): Payer: Medicare Other | Admitting: Oncology

## 2010-08-29 DIAGNOSIS — N289 Disorder of kidney and ureter, unspecified: Secondary | ICD-10-CM

## 2010-08-29 DIAGNOSIS — I82409 Acute embolism and thrombosis of unspecified deep veins of unspecified lower extremity: Secondary | ICD-10-CM

## 2010-08-29 DIAGNOSIS — D649 Anemia, unspecified: Secondary | ICD-10-CM

## 2010-08-29 LAB — CBC
HCT: 31.9 % — ABNORMAL LOW (ref 36.0–46.0)
HCT: 31.9 % — ABNORMAL LOW (ref 36.0–46.0)
HCT: 32.6 % — ABNORMAL LOW (ref 36.0–46.0)
HCT: 35 % — ABNORMAL LOW (ref 36.0–46.0)
Hemoglobin: 11.1 g/dL — ABNORMAL LOW (ref 12.0–15.0)
Hemoglobin: 10.7 g/dL — ABNORMAL LOW (ref 12.0–15.0)
Hemoglobin: 11.7 g/dL — ABNORMAL LOW (ref 12.0–15.0)
MCHC: 32.8 g/dL (ref 30.0–36.0)
MCV: 91 fL (ref 78.0–100.0)
MCV: 92.2 fL (ref 78.0–100.0)
MCV: 92.2 fL (ref 78.0–100.0)
MCV: 92.3 fL (ref 78.0–100.0)
MCV: 92.8 fL (ref 78.0–100.0)
Platelets: 325 10*3/uL (ref 150–400)
Platelets: 255 10*3/uL (ref 150–400)
Platelets: 305 10*3/uL (ref 150–400)
RBC: 3.65 MIL/uL — ABNORMAL LOW (ref 3.87–5.11)
RBC: 3.45 MIL/uL — ABNORMAL LOW (ref 3.87–5.11)
RBC: 3.53 MIL/uL — ABNORMAL LOW (ref 3.87–5.11)
RBC: 3.79 MIL/uL — ABNORMAL LOW (ref 3.87–5.11)
RBC: 3.97 MIL/uL (ref 3.87–5.11)
RDW: 13.8 % (ref 11.5–15.5)
WBC: 5.1 10*3/uL (ref 4.0–10.5)
WBC: 5.9 10*3/uL (ref 4.0–10.5)
WBC: 10.2 10*3/uL (ref 4.0–10.5)
WBC: 4.9 10*3/uL (ref 4.0–10.5)
WBC: 5.3 10*3/uL (ref 4.0–10.5)
WBC: 6.7 10*3/uL (ref 4.0–10.5)
WBC: 8.9 10*3/uL (ref 4.0–10.5)

## 2010-08-29 LAB — BASIC METABOLIC PANEL
BUN: 15 mg/dL (ref 6–23)
CO2: 22 mEq/L (ref 19–32)
CO2: 23 mEq/L (ref 19–32)
Calcium: 8.9 mg/dL (ref 8.4–10.5)
Calcium: 8.9 mg/dL (ref 8.4–10.5)
Chloride: 106 mEq/L (ref 96–112)
Chloride: 106 mEq/L (ref 96–112)
Creatinine, Ser: 1.34 mg/dL — ABNORMAL HIGH (ref 0.4–1.2)
Creatinine, Ser: 1.44 mg/dL — ABNORMAL HIGH (ref 0.4–1.2)
GFR calc Af Amer: 44 mL/min — ABNORMAL LOW (ref >60)
GFR calc Af Amer: 47 mL/min — ABNORMAL LOW (ref >60)
Sodium: 134 mEq/L — ABNORMAL LOW (ref 135–145)

## 2010-08-29 LAB — COMPREHENSIVE METABOLIC PANEL
ALT: 14 U/L (ref 0–35)
ALT: 17 U/L (ref 0–35)
AST: 23 U/L (ref 0–37)
Albumin: 3.4 g/dL — ABNORMAL LOW (ref 3.5–5.2)
Alkaline Phosphatase: 92 U/L (ref 39–117)
Alkaline Phosphatase: 99 U/L (ref 39–117)
CO2: 22 mEq/L (ref 19–32)
Calcium: 8.9 mg/dL (ref 8.4–10.5)
Chloride: 102 mEq/L (ref 96–112)
Creatinine, Ser: 1.58 mg/dL — ABNORMAL HIGH (ref 0.4–1.2)
GFR calc Af Amer: 39 mL/min — ABNORMAL LOW (ref >60)
GFR calc non Af Amer: 32 mL/min — ABNORMAL LOW (ref >60)
Potassium: 3.9 mEq/L (ref 3.5–5.1)
Sodium: 137 mEq/L (ref 135–145)
Total Bilirubin: 0.7 mg/dL (ref 0.3–1.2)
Total Protein: 6.5 g/dL (ref 6.0–8.3)

## 2010-08-29 LAB — DIFFERENTIAL
Basophils Absolute: 0 10*3/uL (ref 0.0–0.1)
Basophils Relative: 1 % (ref 0–1)
Eosinophils Absolute: 0 10*3/uL (ref 0.0–0.7)
Eosinophils Relative: 0 % (ref 0–5)

## 2010-08-29 LAB — URINALYSIS, ROUTINE W REFLEX MICROSCOPIC
Bilirubin Urine: NEGATIVE
Nitrite: NEGATIVE
Specific Gravity, Urine: 1.009 (ref 1.005–1.030)
Urobilinogen, UA: 0.2 mg/dL (ref 0.0–1.0)

## 2010-08-29 LAB — VITAMIN B12: Vitamin B-12: 541 pg/mL (ref 211–911)

## 2010-08-29 LAB — URINE MICROSCOPIC-ADD ON

## 2010-08-29 LAB — LIPID PANEL
LDL Cholesterol: 154 mg/dL — ABNORMAL HIGH (ref 0–99)
Triglycerides: 188 mg/dL — ABNORMAL HIGH (ref <150)

## 2010-08-29 LAB — PROTHROMBIN GENE MUTATION

## 2010-08-29 LAB — IRON AND TIBC
Iron: 59 ug/dL (ref 42–135)
Saturation Ratios: 20 % (ref 20–55)
TIBC: 296 ug/dL (ref 250–470)

## 2010-08-29 LAB — LUPUS ANTICOAGULANT PANEL
DRVVT: 51.7 secs — ABNORMAL HIGH (ref 34.7–40.5)
PTT Lupus Anticoagulant: 31.9 secs — ABNORMAL LOW (ref 32.0–43.4)
dRVVT Incubated 1:1 Mix: 39 secs (ref 36.1–47.0)

## 2010-08-29 LAB — PHOSPHORUS: Phosphorus: 3.4 mg/dL (ref 2.3–4.6)

## 2010-08-29 LAB — HEPARIN LEVEL (UNFRACTIONATED): Heparin Unfractionated: 0.4 IU/mL (ref 0.30–0.70)

## 2010-08-29 LAB — BETA-2-GLYCOPROTEIN I ABS, IGG/M/A: Beta-2-Glycoprotein I IgA: <9 SAU (ref <=20)

## 2010-08-29 LAB — FERRITIN: Ferritin: 116 ng/mL (ref 10–291)

## 2010-08-29 LAB — ANTITHROMBIN III: AntiThromb III Func: 102 % (ref 76–126)

## 2010-08-29 LAB — FOLATE: Folate: >20 ng/mL

## 2010-08-29 LAB — D-DIMER, QUANTITATIVE: D-Dimer, Quant: >20 ug/mL-FEU — ABNORMAL HIGH (ref 0.00–0.48)

## 2010-08-29 LAB — RETICULOCYTES: Retic Count, Absolute: 43.1 10*3/uL (ref 19.0–186.0)

## 2010-08-29 LAB — PROTIME-INR
INR: 2.4 (ref 2.00–3.50)
INR: 1.1 (ref 0.00–1.49)

## 2010-08-29 LAB — TSH: TSH: 2.688 u[IU]/mL (ref 0.350–4.500)

## 2010-08-29 LAB — CARDIOLIPIN ANTIBODIES, IGG, IGM, IGA
Anticardiolipin IgA: <10 APL U/mL — ABNORMAL LOW (ref <10)
Anticardiolipin IgM: <10 MPL U/mL — ABNORMAL LOW (ref <10)

## 2010-08-29 LAB — URINE CULTURE: Colony Count: NO GROWTH

## 2010-08-29 LAB — APTT: aPTT: 45 seconds — ABNORMAL HIGH (ref 24–37)

## 2010-09-15 ENCOUNTER — Telehealth: Payer: Self-pay | Admitting: *Deleted

## 2010-09-15 NOTE — Telephone Encounter (Signed)
OK to fill this prescription with additional refills x Thank you!  

## 2010-09-15 NOTE — Telephone Encounter (Signed)
rec Rf req for Tramadol 50mg .   1-2 po bid prn. Ok to Rf?

## 2010-09-17 MED ORDER — TRAMADOL HCL 50 MG PO TABS: 50.0000 mg | ORAL_TABLET | Freq: Two times a day (BID) | ORAL | Status: DC | PRN

## 2010-09-19 ENCOUNTER — Encounter (HOSPITAL_BASED_OUTPATIENT_CLINIC_OR_DEPARTMENT_OTHER): Payer: Medicare Other | Admitting: Oncology

## 2010-09-19 ENCOUNTER — Other Ambulatory Visit: Payer: Self-pay | Admitting: Oncology

## 2010-09-19 DIAGNOSIS — Z7901 Long term (current) use of anticoagulants: Secondary | ICD-10-CM

## 2010-09-19 DIAGNOSIS — Z170 Estrogen receptor positive status [ER+]: Secondary | ICD-10-CM

## 2010-09-19 DIAGNOSIS — I82509 Chronic embolism and thrombosis of unspecified deep veins of unspecified lower extremity: Secondary | ICD-10-CM

## 2010-09-19 DIAGNOSIS — I82409 Acute embolism and thrombosis of unspecified deep veins of unspecified lower extremity: Secondary | ICD-10-CM

## 2010-09-19 DIAGNOSIS — N289 Disorder of kidney and ureter, unspecified: Secondary | ICD-10-CM

## 2010-09-19 DIAGNOSIS — C50919 Malignant neoplasm of unspecified site of unspecified female breast: Secondary | ICD-10-CM

## 2010-09-19 LAB — CBC WITH DIFFERENTIAL/PLATELET
BASO%: 1.1 % (ref 0.0–2.0)
Basophils Absolute: 0.1 10*3/uL (ref 0.0–0.1)
HCT: 36.7 % (ref 34.8–46.6)
HGB: 11.7 g/dL (ref 11.6–15.9)
LYMPH%: 11.7 % — ABNORMAL LOW (ref 14.0–49.7)
MCHC: 31.9 g/dL (ref 31.5–36.0)
MONO#: 0.5 10*3/uL (ref 0.1–0.9)
NEUT%: 74.4 % (ref 38.4–76.8)
Platelets: 231 10*3/uL (ref 145–400)
WBC: 7.1 10*3/uL (ref 3.9–10.3)
lymph#: 0.8 10*3/uL — ABNORMAL LOW (ref 0.9–3.3)

## 2010-09-19 LAB — PROTIME-INR

## 2010-10-03 ENCOUNTER — Other Ambulatory Visit: Payer: Self-pay | Admitting: Oncology

## 2010-10-03 ENCOUNTER — Encounter (HOSPITAL_BASED_OUTPATIENT_CLINIC_OR_DEPARTMENT_OTHER): Payer: Medicare Other | Admitting: Oncology

## 2010-10-03 DIAGNOSIS — C50919 Malignant neoplasm of unspecified site of unspecified female breast: Secondary | ICD-10-CM

## 2010-10-03 DIAGNOSIS — Z7901 Long term (current) use of anticoagulants: Secondary | ICD-10-CM

## 2010-10-03 DIAGNOSIS — I82409 Acute embolism and thrombosis of unspecified deep veins of unspecified lower extremity: Secondary | ICD-10-CM

## 2010-10-03 DIAGNOSIS — N289 Disorder of kidney and ureter, unspecified: Secondary | ICD-10-CM

## 2010-10-03 LAB — PROTIME-INR: INR: 3 (ref 2.00–3.50)

## 2010-10-07 NOTE — Assessment & Plan Note (Signed)
[facility]   NAME:  Wendy Cole, Wendy Cole           ACCOUNT NO.:  [id number]   MEDICAL RECORD NO.:  [id number]      DATE OF BIRTH:  [birth year]   PHYSICIAN:  Maxwell Caul, M.D. VISIT DATE:  11/28/2007                                   OFFICE VISIT   Wendy Cole is a lady I saw 10 days ago for the first time,  although she had been followed here dating back to early June 2009.  She  has a surgical wound over the right first metatarsal head and the other  is a large superficial wound over a left fifth metatarsal head.  She has  had some pain.  She is compliant with the staying off her feet as much  as possible.  I packed the wound on the first metatarsal head last week  with a silver-based packing.  The wound on the left fifth metatarsal  head is being washed and dressed.  She continues in bilateral healing  sandals.   On examination, temperature 98.1, pulse 69, respirations 16, and blood  pressure 139/61.  I am still concerned about the area over the right  first metatarsal head.  The wound probes down to 1.7 cm from 1.1 cm last  week.  There continues to be what appears to be uric acid crystals  and/or purulent drainage coming out of the recess of the wound which I  cultured yet again, although last week this did not culture.  She  remains on Cipro, although I do not see an actual positive culture  result.  The wound on the left fifth metatarsal head measures 1.5 x 2.  This is improvement from last week.  I think this will gradually close  over.   IMPRESSION:  Surgical wound, right first metatarsal head.  I am very  concerned about the whole area around the wound that appears to have  some fluctuation suggesting underlying fluid.  If there is a uric acid  draining out of this, one would think she has a connection with her  metatarsophalangeal joint.  I think underlying possibilities of septic  arthritis and/or osteomyelitis have existed.  I have ordered  an MRI of  this foot, and I have spoken to her orthopedic surgeon, Dr. Montez Morita to  look at this in 2 days' time as well.  In the meantime, I have packed  this with Aquacel Ag bulky dressing.  She will continue offloading.  There was no change in the orders for the left fifth metatarsal head.           ______________________________  Maxwell Caul, M.D.     MGR/MEDQ  D:  11/28/2007  T:  11/29/2007  Job:  [id number]

## 2010-10-07 NOTE — Assessment & Plan Note (Signed)
[facility]   NAME:  CHAMPAGNE, PALETTA           ACCOUNT NO.:  [id number]   MEDICAL RECORD NO.:  [id number]      DATE OF BIRTH:  10/21/1935   PHYSICIAN:  Maxwell Caul, M.D.      VISIT DATE:                                   OFFICE VISIT   Ms. Kasprzak is a patient we have been following for wounds that are  likely related to significant tophaceous gout.  She had a single large  surgical wound on her first right metatarsal head plantar aspect.  When  I initially saw this, it was draining significant tophaceous material.  This has gradually receded than when last seen, there were two open  areas.  She reports that she is washing this daily, applying topical  antibiotics, a bulky dressing and she continues in her healing sandal.   PHYSICAL EXAMINATION:  Her temperature is 98.  Blood pressure 158/70.  The area is now a very tiny open area in this aspect.  The last time she  was here I expressed purulent material.  The culture of this was  actually negative.  They are completing antibiotics.  I debrided a small  amount of callus from the surface of the wound.  The underlying open  area is very superficial and healthy looking.   IMPRESSION:  Surgical wound complicated by draining tophaceous gout.  I  continued the same regimen and continuing her in a healing sandal.  She  will finish her doxycycline.  I do not think any further antibiotics are  required.  This appears to be closing over.           ______________________________  Maxwell Caul, M.D.     MGR/MEDQ  D:  05/07/2008  T:  05/08/2008  Job:  [id number]

## 2010-10-07 NOTE — Assessment & Plan Note (Signed)
[facility]   NAME:  Wendy Cole, Wendy Cole           ACCOUNT NO.:  [id number]   MEDICAL RECORD NO.:  [id number]      DATE OF BIRTH:  1935/06/02   PHYSICIAN:  Theresia Majors. Tanda Rockers, M.D.      VISIT DATE:                                   OFFICE VISIT   SUBJECTIVE:  [name] was seen on November 08, 2007, for evaluation  of bilateral surgical dehiscences from bunionectomies.  We treated her  at that time with adjustment of her offloading healing sandal on the  right foot.  Since then, she has complained of some increased drainage  and pain.  She has manipulated the offloading transverse felt.  She  denies fever or malodor.  She continues to bear weight and perform  normal domestic duties.  She returns for evaluation with her husband.   OBJECTIVE:  Inspection of the right foot shows that the wound edges  remain well approximated.  The wound of the right first metatarsal head  medially is the only wound examined.  There is no hyperemia.  The  capillary refill is brisk.  There is no evidence of ischemia or  ascending cellulitis or lymphangitis.  The wound was sounded with a Q-  tip and the granulation tissue appears to be healthy.  Thereafter, the  wound was irrigated with saline.  We demonstrated irrigation of this  wound with a saline and a blunt needle to the husband and instructed him  to continue this procedure daily using tap water.  We have readjusted  the healing sandal with extension more proximally of the piece of pad.  We have given a re-explanation of the concept of offloading.   Assesssment:  Clinical improvement with off-loading.   PLAN:  We are encouraging the patient to continue the modified  offloading healing sandal and avoid unneccessary standing and walking.  She will keep her original appointment.  She will continue the daily  antibacterial soap washes of this wound and add the jet irrigations with  the blunt needle.  We have given both the  husband and the wife an  opportunity to ask questions, they seem to understand the instructions  and indicate they will be compliant.  We have encouraged them to call  the clinic if there is any difficulty whatsoever, specifically if there  is increase in pain, increase in drainage, fever, or malodor.  They both  seem to understand.  They express gratitude for having been seen in the  clinic.      Harold A. Tanda Rockers, M.D.  Electronically Signed     HAN/MEDQ  D:  11/10/2007  T:  11/11/2007  Job:  [id number]   cc:   Myrtie Neither, MD

## 2010-10-07 NOTE — Assessment & Plan Note (Signed)
[facility]   NAME:  Wendy Cole, Wendy Cole           ACCOUNT NO.:  [id number]   MEDICAL RECORD NO.:  [id number]      DATE OF BIRTH:  1936/05/06   PHYSICIAN:  Theresia Majors. Tanda Rockers, M.D. VISIT DATE:  11/08/2007                                   OFFICE VISIT   SUBJECTIVE:  Wendy Cole is a 75 year old lady who we are following  for a surgical dehiscence following bilateral bunionectomies.  Since her  last visit on November 01, 2007, she has been wearing a healing sandal on the  right foot with offloading healing strips.  The left foot has been  protected with a Cam walker, a bulky dressing and a Silverlon swath.  There has been no excessive drainage.  She continues on allopurinol and  colchicine.  There has been no fever, drainage, or malodor.  She has  completed a course of Cipro.  She is accompanied by her husband.  Her  pain has been well-controlled.   OBJECTIVE:  Blood pressure is 170/66, respirations 16, pulse rate 65,  temperature 97.9.  Inspection of the lower extremity shows that wound #1  on the right first met head has contracted considerably.  There is now  100% granulation.  There is no evidence of tophaceous debris in the  wound.  There is minimum erythema.  The wound is relatively nonpainful.  The volume and the area decreases have been confirmed by measurements  and photographs.  Please refer to the data entries.  Similarly, wound #2  has decreased significantly.  There remains a significant portion of  undermining on the medial portion.  This area was irrigated with saline  following anesthesia with a LET mixture.  A new wound #3 on the left  anterior fifth met head appears to be related to pressure and abrasion.  This wound extends into the subcutaneous tissue but does not extend into  the joint or tendon sheath spaces.  This wound was cleaned with saline.  There is healthy granulation.  There is no evidence of ascending  infection.  The pedal pulses  remain palpable.   ASSESSMENT:  Clinical improvement of the surgical wound dehiscence as a  result of adequate offloading.  There is no evidence of infection.   PLAN:  We will continue offloading with a healing sandal and the Cam  walker.  We have refashioned the failed pad and the healing sandal as to  offload the fifth met head.  The patient will continue local  antibacterial soap washing of the right foot.  She will keep the Cam  walker on during the day.  She will be permitted to take it off at  night.  We will reevaluate her response to therapy in 1 week.      Harold A. Tanda Rockers, M.D.  Electronically Signed     HAN/MEDQ  D:  11/08/2007  T:  11/09/2007  Job:  [id number]

## 2010-10-07 NOTE — Discharge Summary (Signed)
NAME:  Wendy Cole, Wendy Cole NO.:  [id number]   MEDICAL RECORD NO.:  [id number]          PATIENT TYPE:  INP   LOCATION:  4732                         FACILITY:  [facility]   PHYSICIAN:  Myrtie Neither, MD      DATE OF BIRTH:  17-Jan-1936   DATE OF ADMISSION:  09/16/2007  DATE OF DISCHARGE:  09/19/2007                               DISCHARGE SUMMARY   ADMITTING DIAGNOSES:  Abscesses bilateral feet, gouty arthropathy, and  diabetes mellitus.   DISCHARGE DIAGNOSES:  Abscesses bilateral feet, gouty arthropathy, and  diabetes mellitus.   COMPLICATIONS:  None.   INFECTIONS:  Abscess bilateral feet, gouty.   OPERATIONS:  I&D, left and right foot abscesses.   CONSULTATION:  Dr. Dietrich Pates due to cardiac arrhythmia.   PERTINENT HISTORY:  This is a 75 year old with history of diabetes  mellitus and gouty arthropathy seen in the office with abscesses that  are draining involving both feet.   PERTINENT PHYSICAL:  The patient had left foot fifth toe abscess dorsal  lateral aspect, redness, swollen tophi, febrile about the abscess.  Right foot great joint plantar aspect abscess draining with redness,  fever, and tender.  X-ray did not show any evidence of osteomyelitis.   HOSPITAL COURSE:  The patient underwent I&D of both feet.  Postop  course, the patient was placed on IV antibiotics and continued on her  regular home medications.  The patient was fitted with Darkot shoes  bilaterally and underwent physical therapy and instruction how to use  them.  The patient's swelling subsided.  She remained afebrile.  Cardiac  evaluation did not show any evidence of myocardial infarction or any  heart damage.  The patient deemed stable enough to be discharged and  discharged.  Continue her previous medications, to return to office in 1  week, and home health arranged for dressing changes of both feet.  The  patient is discharged in stable and satisfactory condition.      Myrtie Neither,  MD  Electronically Signed     AC/MEDQ  D:  10/19/2007  T:  10/20/2007  Job:  [id number]

## 2010-10-07 NOTE — Assessment & Plan Note (Signed)
[facility]   NAME:  Wendy Cole, Wendy Cole           ACCOUNT NO.:  [id number]   MEDICAL RECORD NO.:  [id number]      DATE OF BIRTH:  [birth year]   PHYSICIAN:  Maxwell Caul, M.D. VISIT DATE:  02/20/2008                                   OFFICE VISIT   Mrs. Davidow comes in today to review bilateral foot wounds  secondary to significant tophaceous gout.  She largely has 2 wounds we  have been following, one on the lateral aspect of her left fifth  metatarsal head and the other on the right first metatarsal head, which  was initially a surgical wound.  She further had a wound on the left  fifth metatarsal head anteriorly, which resolved as of last time we have  seen her.   Currently, they are washing both areas and applying topical antibiotics.  I am not really sure they are keeping these wrapped in any way.   On exam, her temperature is 98.4.  The wound on the left fifth  metatarsal head, I manually removed some callus from around this wound.  It is fully epithelialized and looks like it will close over.  The area  of the surgical wound on the plantar aspect of her right first  metatarsal head still has an open area in its mid section.  On probing  this, there is an open area which probably still probes the bone at  least, if not the metatarsal phalangeal joint.  As usual, there was some  purulent-looking drainage here that has not cultured in the past;  however, this did not look like uric acid, and I did reculture this one  more time.   IMPRESSION:  Wounds relating to tophaceous gout.  I will continue with a  simple offloading measures for the wound on the left lateral metatarsal  head with Allevyn adhesive.  To the wound on the right first metatarsal  head on plantar aspect, I have asked them to apply Iodosorb to help with  the drainage here.  She will continue to offload both her feet in  healing sandals.  We will see them again in a month.       ______________________________  Maxwell Caul, M.D.     MGR/MEDQ  D:  02/20/2008  T:  02/21/2008  Job:  [id number]

## 2010-10-07 NOTE — Consult Note (Signed)
NAME:  Wendy Cole, Wendy Cole           ACCOUNT NO.:  [id number]   MEDICAL RECORD NO.:  [id number]          PATIENT TYPE:  REC   LOCATION:  FOOT                         FACILITY:  [facility]   PHYSICIAN:  Theresia Majors. Tanda Rockers, M.D.DATE OF BIRTH:  March 30, 1936   DATE OF CONSULTATION:  11/01/2007  DATE OF DISCHARGE:                                 CONSULTATION   REASON FOR CONSULTATION:  Wendy Cole is a 75 year old female  referred by Dr. Myrtie Neither for evaluation of a nonhealing bilateral  foot ulcers.   IMPRESSION:  Surgical wound dehiscence.   RECOMMENDATION:  Bilateral offloading to be achieved with a CAM Walker  modifications and padding.  Serial exams.  Continuation of medical  management of gout.   SUBJECTIVE:  Wendy Cole is a 75 year old lady who underwent  surgical debridement and evacuation of tophaceous debris from the first  metatarsophalangeal joints on September 16, 2007.  Her sutures were removed,  pinned 2 weeks later.  When the patient began to walk, she experienced  some separation of the incisions, which progressed to a full blown  dehiscence.  In the interim, she has been treated with protective  sandals, and the home health nurse has been seeing her for irrigations  and daily wound care.  She has been on the course of antibiotics.  She  has never been febrile.  There has been no malodorous drainage.  Her  pain is very well tolerated.  She continues on Levaquin as well as  indomethacin, allopurinol, and colchicine.   PAST MEDICAL HISTORY:  Remarkable for:  1. Hypertension.  2. Gastroesophageal reflux.  3. Anemia.  4. Cataracts.   ALLERGIES:  She denies allergies to medications.   CURRENT MEDICATION LIST:  Over-the-counter aspirin 81 mg daily.   PRESCRIPTION MEDICATION:  1. Synthroid 150 mcg daily.  2. Torsemide 20 mg daily.  3. Prilosec 20 daily.  4. Wellbutrin 1 p.o. daily.  5. Enalapril 20 mg daily.  6. Plavix 75 mg daily.  7. Ferrous gluconate.  8.  Vitamin B12.  9. Foltx.  10.Indomethacin 50 mg b.i.d.  11.Cipro 500 mg b.i.d.  12.Colchicine 0.6 mg at 1-1/2 tablets daily.  13.Allopurinol daily 300 mg.  14.She has discontinued the Levaquin.   PREVIOUS SURGERY:  1. Thyroidectomy.  2. Hysterectomy.  3. Tonsillectomy.  4. Parotidectomy.  5. She has had glaucoma surgery.  6. Her recent bunionectomies were performed in 2009.   FAMILY HISTORY:  Positive for diabetes, heart attack, stroke, and  cancer.   SOCIAL HISTORY:  She is married.  She has 3 adult children, 2 live  locally, 1 remotely.  She and her husband live in [place].  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   REVIEW OF SYSTEMS:  She has generally been active with normal exercise  tolerance.  Her appetite is good.  Her weight has been stable.  She quit  smoking 3 years ago when she had a right temporal stroke.  She denies a  chronic cough.  She denies hemoptysis.  She denies speech impediments  apart from the symptoms that she have with her original stroke.  There  has been no recurrence of paralysis, visual changes, or paresthesias.  She specifically denies angina pectoris.  She has never been treated for  a cardiac disease.  She has occasional constipation.  She has been  treated for gastroesophageal reflux.  There have been no changes in her  bowel habits.  There has been no significant weight loss.  She has had  trauma related pain in her left knee.  She does have general discomfort  in her hips and shoulders attributed to arthritis.  There is no heat or  cold intolerance, polydipsia, or polyphagia.  The remainder of the  review of systems is negative.   PHYSICAL EXAMINATION:  GENERAL:  She is an alert, oriented female.  She  is accompanied by her husband.  She is in good contact with reality and  responds appropriately to questioning.  VITAL SIGNS:  Her height is 5 feet 6 inches tall, blood pressure is  179/77, respirations 16, pulse rate 77, and  temperature 98.  She is  accompanied by her husband.  HEENT:  Clear.  NECK:  Supple.  Trachea is midline.  Thyroid is nonpalpable.  LUNGS:  Clear.  HEART:  Sounds are distant.  ABDOMEN:  Soft without palpable masses.  EXTREMITIES:  Remarkable for +1-2 bilateral edema with mild changes of  stasis. There are +3 bilater PT pulses.  There are prominent reticular  veins bilaterally.  There are no ulcerations in the gaiter area.  There  are clean granulating wounds at the right first met head and at the left  fifth met head.  There is no drainage from either wound.  These wounds  are clean with halos of advancing epithelium.  NEUROLOGIC:  The patient lacks protective sensation as judged by the  Semmes-Weinstein filament.  There is no associated adenopathy.  The  dorsalis pedis pulses are +3 bilaterally.  There is a tendon sheath cyst  on the dorsum of the left foot that is nontender, and has an area of  callus associated with it.   DISCUSSION:  Wendy Cole has clean surgical wounds, which are well  vascularized with no evidence of bacterial load.  She is wearing a Darco  wedge and describes of a relatively active life in spite of these  ulcers.  I have explained the concept of offloading to both the patient  and her husband in terms that they seem to understand.  We will proceed  with more effective offloading by placing her in a CAM Walker for the  left lower extremity and a modified healing sandal with transverse  filled offloading strips for the right first met head.  The patient has  been encouraged to continue the course of antibiotics, which she is  currently taking.  We have cultured both wounds, although, clinically  there is no evidence of infection.  We have placed her in both the  healing sandal for the right and the CAM Walker for the left.  She  appears to be comfortable and understands the objective of offloading to  avoid repeated injury.  We have given both the patient  and her husband  an opportunity to ask questions.  They seem to understand and indicate  that they will be compliant.  She will be reevaluated in 1 week p.r.n.  We have emphasized that if there is any evidence of redness, pain, or  discoloration, she should call the clinic for an urgent reevaluation or  be seen by her primary care physician, the  emergency room doctor, Dr.  Montez Morita.  The patient understands and expresses gratitude for having been  seen in the clinic.      Harold A. Tanda Rockers, M.D.  Electronically Signed     HAN/MEDQ  D:  11/01/2007  T:  11/02/2007  Job:  [id number]   cc:   Myrtie Neither, MD

## 2010-10-07 NOTE — Assessment & Plan Note (Signed)
[facility]   NAME:  Wendy Cole, Wendy Cole           ACCOUNT NO.:  [id number]   MEDICAL RECORD NO.:  [id number]      DATE OF BIRTH:  November 12, 1935   PHYSICIAN:  Maxwell Caul, M.D.      VISIT DATE:                                   OFFICE VISIT   [name] is being followed here for a surgical wounds status  post bunionectomy incisions in April.  She is being treated currently  with offloading sandal, antibacterial soap washes, and bulky dressings  as well as flat healing sandal.  She has 2 wounds, one is a linear  incision over the right first metatarsal head and the other is a larger  superficial wound over the left fifth metatarsal head.  She does not  complain of significant pain.  She is complaining about pain and ulcers  on her tongue.   On examination, temperature is 97.9, pulse 63, respirations 18, and  blood pressure is 149/74.  There is a superficial wound over the left  fifth metatarsal head measuring 1.6 x 2.5 x 0.2.  This has good  granulation tissue and has minimal advancing epithelium.  This does not  appear to be infected.  She also probably has a small traumatic wound  over the left anterior fifth toe.  I think this is trauma from the  healing sandal.  The most problematic wound that I think is over the  right first metatarsal head.  This is a linear surgical wound that  probes at its inferior recesses to 1.1 cm into the plantar surface of  her metatarsal head.  I cultured this, although there was no significant  drainage.   IMPRESSION:  Nonhealing surgical wounds.  We have continued the same  treatment of the fifth metatarsal head including daily antiseptic soap  washes and a bulky covering dressing, was applied healing sandal.  To  the right fifth metatarsal head, we have applied a plain packing along  with SilvaSorb Gel and leave an adhesive.  They are instructed to keep  off her feet as much as possible.  I am fearful that the area of  the  right first metatarsal head may be very problematic to close over.           ______________________________  Maxwell Caul, M.D.     MGR/MEDQ  D:  11/18/2007  T:  11/19/2007  Job:  [id number]

## 2010-10-07 NOTE — Assessment & Plan Note (Signed)
[facility]   NAME:  ASIANAE, MINKLER           ACCOUNT NO.:  [id number]   MEDICAL RECORD NO.:  [id number]      DATE OF BIRTH:  09-27-1935   PHYSICIAN:  Maxwell Caul, M.D. VISIT DATE:  03/19/2008                                   OFFICE VISIT   Wendy Cole is a patient whom we have been following for wounds  largely related to significant tophaceous gout, but also trauma.  She  has two wounds, one on the lateral aspect of her left fifth metatarsal  head, the other on her first right metatarsal head plantar aspect, which  was initially a surgical wound through drain tophi.  She further has a  traumatic wound on the left fifth metatarsal head anteriorly, which she  has retraumatized.   Currently, they are washing both areas and applying topical antibiotics.  The last time I switched the wound on the right first metatarsal head to  Iodosorb.  She has been applying a thick adhesive protective bandage  such as Allevyn to the left fifth metatarsal head.   On examination, she is afebrile.  The wound on the left fifth metatarsal  head actually looks quite good.  I manually removed some callus from  around this wound.  It actually looks fairly epithelialized and looks  like it will close over.  Unfortunately, just proximal to this, she has  traumatized the area again on the left fifth metatarsal head anteriorly.   The area of the surgical wound on the plantar aspect of her right first  metatarsal head also was doing nicely and certainly does not probe very  deeply.  I think the wound has largely improved.  At one point, this  probed easily to bone.   IMPRESSIONS:  Wounds related largely tophaceous gout and trauma.  All of  these are improving.  She will continue with the Iodosorb to the right  first metatarsal head, the washing, and Allevyn adhesive topical  antibiotic to the left first metatarsal head.           ______________________________  Maxwell Caul, M.D.     MGR/MEDQ  D:  03/19/2008  T:  03/20/2008  Job:  [id number]

## 2010-10-07 NOTE — Assessment & Plan Note (Signed)
[facility]   NAME:  ELA, MOFFAT           ACCOUNT NO.:  [id number]   MEDICAL RECORD NO.:  [id number]      DATE OF BIRTH:  1935/12/18   PHYSICIAN:  Maxwell Caul, M.D. VISIT DATE:  12/05/2007                                   OFFICE VISIT   I saw Ms. Pearman on November 28, 2007.  At that point, she had uric  acid coming out of a surgical wound on her right first metatarsal head.  The wound still probes very deeply, was 1.7 cm last week.  I had  actually packed this.  Because of the fluctuance on the base of the  first metatarsal head and the pain, I sent her for an MRI.  This was  noncontrast secondary to renal insufficiency.  However, this showed  midfoot erosions and gouty tophi.  There was also a plantar soft tissue  mass over the first metatarsal head with the imaging characteristics,  which could represent gouty tophus or abscess.  The radiologist's  favored tophus.  In any case, Dr. Celene Skeen instructions when he saw the  patient last week was to not pack this and not probe it, therefore, I  have not done either one of those today.  She also has a superficial  wound over the left fifth metatarsal head measuring 1.8 x 2.1, there is  a new wound on the left anterior fifth metatarsal head measuring 1 x 0.7  of unclear etiology.   PHYSICAL EXAMINATION:  VITAL SIGNS:  Temperature 97.6, pulse 69,  respirations 20, and blood pressure 137/67.   I actually think the area over the right first metatarsal head looks  better.  There is certainly not the foot fluctuance over the first  metatarsal head.  There is less pain.  The wound itself looks cleaner,  therefore, I have not altered anything of what the husband is currently  doing.  The areas over the left fifth metatarsal head and left anterior  fifth metatarsal head which is a new wound, looked roughly the same to  me.   IMPRESSION:  Surgical wound, right first metatarsal head with involved  soft  tissue gout and gout in the first metatarsal phalangeal joint.  All  of this looks better to me today.  They are following instructions of  this wound from Dr. Montez Morita.  Right now, I would not consider altering  these.  I think, we agree with pressure relief and she continues in the  modified healing sandal.  The wounds over the left fifth metatarsal head  and left anterior fifth metatarsal head continue to look unchanged to  me.  I do not believe they are responding well to the current treatment  that the husband is doing, although I am not particularly certain of  what he is doing and who gave him these instructions.  In any case, she  continues in the healing sandal.   They are due to followup with Dr. Montez Morita on Friday.  I will make  arrangements to see them again in a month.  I am not really certain that  they need to follow on both places, however, the husband seem to want  her bring her back here and I will give them a chance to go through this  current treatment arrangements and see how they are doing in a month's  time.           ______________________________  Maxwell Caul, M.D.     MGR/MEDQ  D:  12/05/2007  T:  12/06/2007  Job:  [id number]

## 2010-10-07 NOTE — Assessment & Plan Note (Signed)
[facility]   NAME:  Wendy Cole, Wendy Cole           ACCOUNT NO.:  [id number]   MEDICAL RECORD NO.:  [id number]      DATE OF BIRTH:  20-Jan-1936   PHYSICIAN:  Jonelle Sports. Sevier, M.D.  VISIT DATE:  01/04/2008                                   OFFICE VISIT   HISTORY:  This 75 year old white female with tophaceous gout is seen for  ulcerations at the plantar aspect of the right first metatarsal head  area and another at the left fifth metatarsal head area laterally.  These ulcers have been created where there has been erosion and  extrusion of tophaceous material.   She had had a recent change in her medications to include the addition  of colchicine which she has finally been able to tolerate up to a total  of 0.9 mg per day.  With this, the tophus formation has diminished, and  she has nearly healed one of these wounds.   PHYSICAL EXAMINATION:  Blood pressure 135/57, pulse 72, respirations 20,  and temperature 98.3.  The lesion at the right first metatarsal head is  nearly healed now, it measures 0.1 x 1 cm and is slit like and  approximately 0.3 cm in depth.  There is no evidence of inflammation.  No slough and no drainage at this point.   The lesion on the left fifth metatarsal head laterally measures 1.5 x  1.7 cm and is approximately 0.1 cm in depth.  It is covered with some  loose skin and slough material.  There is no evidence of spreading  inflammation, no odor, or evidence of deep infection.   IMPRESSION:  Ulcerating tophaceous gout, improving.   DISPOSITION:  The patient is to continue her usual gout medications  particularly and that she seems to be now on a regimen that is  preventing progression of these lesions and recurrence of tophus  accumulation.   The wound on the right first metatarsal head area requires no immediate  treatment, it is to be dressed daily with an application of Neosporin  ointment and covered with a nonstick pad.   The wound  on the left fifth metatarsal head area is sharply selectively  debrided,  ridding it  of the crust and loose skin.  There is 1 tiny  point in this wound in which one could see a white dot which appears to  be a bit of tophus, but this could not easily be plucked out and so was  left alone.   This wound too will be treated with application of Neosporin, Mepitel, a  gauze pad, and a gentle wrap.   The husband will change these dressings at home on a daily basis.   Follow up visit for the patient here will be in 2 weeks.          ______________________________  Jonelle Sports Cheryll Cockayne, M.D.    RES/MEDQ  D:  01/04/2008  T:  01/05/2008  Job:  [id number]

## 2010-10-07 NOTE — Assessment & Plan Note (Signed)
[facility]   NAME:  MAJESTY, STEHLIN           ACCOUNT NO.:  [id number]   MEDICAL RECORD NO.:  [id number]      DATE OF BIRTH:  28-May-1935   PHYSICIAN:  Maxwell Caul, M.D. VISIT DATE:  04/23/2008                                   OFFICE VISIT   Wendy Cole is a patient who we have been following for wounds,  largely related to significant tophaceous gout.  Both of her wound areas  are largely on the right first metatarsal head, plantar aspect, which  was initially a surgical wound, draining a tophaceous material.   Currently, she is applying a dry dressing to this area.  She tells me  that over the weekend, there was a raised cyst that she punctured and it  drained a sanguineous purulent material.  She has noted increased pain  in the area.   On examination, she has a linear area on the right first metatarsal  head.  This is now bridged into 2 areas.  There was no evidence of  draining tophi.  When I pressed on the plantar aspect of this wound,  purulent material was expressed (clearly not uric acid crystals).  This  was cultured.  The area was somewhat painful.  All of this underwent a  light removal of callus.  The 2 areas, both appear otherwise healthy.   IMPRESSION:  1. Surgical wound, complicated by draining tophaceous gout.  I have      recommended an aggressive cleaning with antibacterial soap,      Polysporin, a dry dressing, and continuing in her healing sandal.  2. Possible cellulitis, culture was done.  I started her on      doxycycline.   We will see her back in 2 weeks, earlier if necessary.           ______________________________  Maxwell Caul, M.D.     MGR/MEDQ  D:  04/23/2008  T:  04/24/2008  Job:  [id number]

## 2010-10-07 NOTE — Op Note (Signed)
NAME:  Wendy Cole, Wendy Cole           ACCOUNT NO.:  [id number]   MEDICAL RECORD NO.:  [id number]          PATIENT TYPE:  AMB   LOCATION:  SDS                          FACILITY:  [facility]   PHYSICIAN:  Myrtie Neither, MD      DATE OF BIRTH:  [birth year]   DATE OF PROCEDURE:  09/16/2007  DATE OF DISCHARGE:                               OPERATIVE REPORT   PREOPERATIVE DIAGNOSES:  1. Abscess, right great joint.  2. Abscess, left fifth toe metatarsophalangeal joint.   POSTOPERATIVE DIAGNOSES:  1. Abscess, right great joint.  2. Abscess, left fifth toe metatarsophalangeal joint.   ANESTHESIA:  General.   PROCEDURES:  1. Incision and excisional drainage, right great joint.  2. Incision and excisional debridement, left fifth joint.   The patient was taken to the operating room, after given adequate preop  medications, given general anesthesia and intubated.  Both feet were  prepped with DuraPrep and draped in sterile manner.  Tourniquet was not  used.  The left fifth metatarsophalangeal joint draining abscess plantar  aspect and dorsally.  Incision opening was extended both distal and  proximally.  Excisional debridement was done all the way down to the  metatarsal itself with debridement of chalky gout material and necrotic  tissue.  After thorough debridement, copious irrigation with some pulse  irrigator was then done and repeated several times.  After adequate  debridement and cleansing of the area, wound closure was then done with  3-0 nylon.  Next, the right great toe joint drainage plantar aspect  wounds were extended proximally and distally down to the capsule, gouty  material was excised as well as what appears to be some purulent  material.  Cultures of this was done for C and S and Gram stain.  Excisional debridement resecting part of the capsule and necrotic tissue  was done followed by a repeated use  .  After adequate debridement was done, wound closure was then done with  3-0  nylon.  Compressive dressing was applied.  The patient tolerated the  procedure quite well and sent to recovery room in stable and  satisfactory position.  The patient is being kept 23-hour observation  and was placed on IV antibiotics, pain control, and instruction as to  how to use Darkot shoes bilaterally with the use of walker.  The patient  will be seen back in the office in 1 week.      Myrtie Neither, MD  Electronically Signed     AC/MEDQ  D:  09/16/2007  T:  09/17/2007  Job:  [id number]

## 2010-10-07 NOTE — Assessment & Plan Note (Signed)
[facility]   NAME:  Wendy Cole, Wendy Cole           ACCOUNT NO.:  [id number]   MEDICAL RECORD NO.:  [id number]      DATE OF BIRTH:  Sep 20, 1935   PHYSICIAN:  Maxwell Caul, M.D. VISIT DATE:  06/04/2008                                   OFFICE VISIT   Ms. Mcglade is a patient who we have been following for wounds that  are likely related to significant tophaceous gout.  She has a large  surgical wound on her right first metatarsal head, plantar aspect.  When  I saw this initially, it was draining a large amount of tophaceous  material.  This has gradually receded.  At one point, there were 2 open  areas in the surgical wound.  I really do not know exactly what they  were doing to this wound.  At one point, they were washing it daily,  applying topical antibiotics, a bulky dressing, and continues in her  healing sandal.   PHYSICAL EXAMINATION:  Her temperature is 98.3.  The area in the  surgical area on the right first metatarsal head is now a very tiny  single open area.  I removed some callus from around this wound, and  there definitely still is an opening, which the patient states  episodically drains although what she is saying, it is not described as  looking like uric acid.  There is no evidence of infection.   IMPRESSION:  Surgical wound, complicated by tophaceous gout.  I asked  her to change to applying Iodosorb to this small open area, covered by a  dry dressing.  I am not completely certain she will comply with this.  Sometimes, Iodosorb is effective in closing small draining areas.  I  really do not believe there is an infection here and nothing needed  culturing.  I will see her again in a month's time.  If she is not  compliant with the suggestions here, I will discharge her at that point.           ______________________________  Maxwell Caul, M.D.     MGR/MEDQ  D:  06/04/2008  T:  06/05/2008  Job:  [id number]

## 2010-10-07 NOTE — Assessment & Plan Note (Signed)
[facility]   NAME:  Wendy Cole, Wendy Cole           ACCOUNT NO.:  [id number]   MEDICAL RECORD NO.:  [id number]      DATE OF BIRTH:  1936-03-15   PHYSICIAN:  Theresia Majors. Tanda Rockers, M.D. VISIT DATE:  11/15/2007                                   OFFICE VISIT   SUBJECTIVE:  Wendy Cole is a 75 year old lady who we are following  for surgical dehiscence of bunionectomy incisions.  In the interim, we  treated her with modified offloading healing sandal with field strips on  the right and a CAM walker for offloading of the left.  In the interim,  she has complained of some pain over the dorsal aspect of the fifth  digit of the left foot.  There has been no fever.  She continues to have  antibacterial soap washing of the right foot using a syringe with a  blunt needle for jet irrigation.  She is accompanied by her husband.   OBJECTIVE:  Blood pressure is 147/65, respirations 20, pulse rate 67,  and temperature is 98.  Inspection of the right first met head shows  that the wound appears to be healthy with healthy granulation.  There is  a minimum amount of white tophi, which is easily mechanically dislodged  from the wound itself.  There is no bleeding.  There is no evidence of  ascending infection or inflammation and there is no excessive pain.  Wounds #2 and #3 on the left fifth met head volarly and dorsally  respectively have healthy granulation.  On the dorsal aspect of the  fifth digit, there is a fluctuant focus consistent with subdermal  hemorrhage.  This area has ecchymosis with central ulceration, which was  easily dislodged with a Q-tip.  This area of accumulation spontaneously  drained following this maneuver.  There is moderate discomfort in this  area.  There is local swelling.  There is no local warmth.  There is no  malodor.  Capillary refill remains brisk and there is no evidence of  concurrent ischemia in either foot.  This wound was cultured.   ASSESSMENT:  Clinical improvement of wound #1 as a result of offloading  and antibacterial soap.  Wounds #2 and #3 show marginal improvement and  questionable deterioration of wound #3 as a result of trauma related to  the mal-fitting of the CAM walker.   PLAN:  We will discontinue the use of the CAM walker.  We will place the  patient in bilateral healing sandals with modified felt strips.  We have  given her prescription for Cipro 250 mg p.o. b.i.d. while awaiting the  culture reports.  In addition, we have given her a prescription to have  custom healing sandals fashioned as we anticipate that her healing time  will be extended due to the comorbidities of gouty arthritis and prior  tobacco use.  We have explained this change in therapy to the patient  and her husband in terms that they both seem to understand.  She will be  reevaluated on Friday to assess her response to therapy with particular  attention to her culture report.      Harold A. Tanda Rockers, M.D.  Electronically Signed     HAN/MEDQ  D:  11/15/2007  T:  11/16/2007  Job:  [id number] 

## 2010-10-07 NOTE — Assessment & Plan Note (Signed)
[facility]   NAME:  Wendy Cole, Wendy Cole           ACCOUNT NO.:  [id number]   MEDICAL RECORD NO.:  [id number]      DATE OF BIRTH:  [birth year]   PHYSICIAN:  Maxwell Caul, M.D. VISIT DATE:  01/20/2008                                   OFFICE VISIT    Ms. Meth comes in today to review her bilateral foot wounds  thought to be secondary to significant tophaceous gout.  She reports  that with recent changes in her medications, her uric acid level has  normalized.  She has also had a recent addition of colchicine.  The  wounds have been improving.   On examination, her temperature is 98.7.  There is still a linear-shaped  wound at the base of her right first metatarsal head.  This is slit-like  measuring 1 x 0.2 x 0.1.  There is no evidence of infection here.  The  wound over the left fifth metatarsal head was anesthetized with 5%  lidocaine.  Some adherent slough and necrotic material was removed.  However, underneath the base of this wound really looks quite good.  There is epithelialization and only a few small open areas.   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  The wound on the right first metatarsal head simply needs a protective  dressing, it appears to be and I applied Allevyn to this.  It does not  appear to be infected.  Over the left fifth metatarsal head, this was  selectively debrided removing adherent crusts and necrotic material.  Most of this is healed.   To the wound on the left fifth metatarsal head, we applied Neosporin,  Mepitel, and a gentle wrap.  She continues in a healing sandal.  We will  see her back in a month's time.           ______________________________  Maxwell Caul, M.D.     MGR/MEDQ  D:  01/20/2008  T:  01/21/2008  Job:  [id number]
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
## 2010-10-10 NOTE — Discharge Summary (Signed)
NAME:  Wendy Cole, Wendy Cole                     ACCOUNT NO.:  [id number]   MEDICAL RECORD NO.:  [id number]                   PATIENT TYPE:  INP   LOCATION:  2021                                 FACILITY:  [facility]   PHYSICIAN:  Labrittany Wechter. Lenord Fellers, M.D.                DATE OF BIRTH:  August 15, 1935   DATE OF ADMISSION:  07/18/2002  DATE OF DISCHARGE:  07/20/2002                                 DISCHARGE SUMMARY   FINAL DIAGNOSES:  1. 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  2. Hypertension.  3. Hyperlipidemia.  4. Sleep apnea.  5. Obesity.  6. Cigarette abuse.  7. Gastroesophageal reflux.  8. Chronic obstructive pulmonary disease.  9. Anxiety/depression.  10.      Dependent edema.  11.      History of renal insufficiency and proteinuria.  12.      Hypothyroidism.  13.      History of iron deficiency anemia.   CONSULTANT:  Gustavus Messing. Orlin Hilding, M.D. - Neurology.   DISCHARGE MEDICATIONS:  1. Foltex 1 p.o. daily for increased homocysteine level.  2. Altace 10 mg daily.  3. Synthroid 0.1 mg daily.  4. Lipitor 20 mg daily.  5. Aspirin 325 mg daily.  6. Wellbutrin XL 300 mg daily.  7. Tranxene 7.5 mg twice a day.   DIET:  A 4 gram sodium, fat modified.   ACTIVITY:  The patient is not to work at this point in time and is to  schedule an appointment to see me one week after discharge.   BRIEF HISTORY:  This 75 year old overweight white female with history of  sleep apnea, hypertension, hyperlipidemia, cigarette abuse, GE reflux,  dependent edema, hypothyroidism, came to the office abruptly 07/18/02.  She  had not been seen in the office since 4/03 because of an insurance change  and had been followed more recently at [facility], but  decided to return to this practice.  She had onset at 7 a.m. on 07/18/02 of  slurred speech, right hand clumsiness and paresthesias of the right face.  By the time she was seen in the office, some four hours later, the  clumsiness in her hand had improved and her speech had improved.  She still  had persistent light facial paresthesias.  She had no prior history of CVA.  The patient was admitted for neurological evaluation.  She underwent CT of  the brain without contrast showing no evidence of CNS bleed, subsequently,  had MRI/MRA of the brain.  No intracranial stenosis or occlusion noted, nor  aneurysm.  There was a family history of aneurysm in her sister who died of  such an event.  She was found to have a 1 cm acute infarction in the left  superior temporal cortex.  Neurology recommended that she be placed on  aspirin.  Carotid Dopplers revealed a 40 to 60% stenosis on the right and a  60 to 80% stenosis on the left.  A 2-D echocardiogram showed no evidence of  thrombus.  She had no dysrhythmia while monitored on telemetry during her  hospitalization.  Glycohemoglobin was normal.  TSH was within normal limits.  She was found to have an elevated homocysteine level and was started on  Foltex at the time of discharge.  Blood pressure control was somewhat of an  issue.  She was anxious in the hospital and systolic blood pressure was  elevated some.  Initially started out on low dose Altace 5 mg daily, but  this was increased to 10 mg daily at the time of discharge.  Am concerned  somewhat that the patient never pursued workup for iron deficiency anemia.  Colonoscopy had been scheduled in 6/03 by Everardo All. Madilyn Fireman, M.D.  Her husband  had emergency bypass surgery and she never followed through with this.  We  will followup with this as an outpatient.   DISCHARGE CONDITION:  The patient is discharged home in stable condition.  Still has some paresthesias about the right face, but no other significant  neurological deficits.   She will take it easy about the house and not return to her part-time job at  the [employer] and [employer].  She also teaches piano  and I have advised her to give  this up entirely and she is reluctant to do  so.  I have had a stern talk with this patient during her hospitalization  about her lifestyle.  She has multiple medical problems and significant risk  factors for recurrent CVA.  She must make significant lifestyle changes.  Husband is aware of this as well.  Thus, she was discharged on 07/20/02 in  stable condition.  Will be seen in office one week after discharge.                                                [name]. Lenord Fellers, M.D.    MJB/MEDQ  D:  07/27/2002  T:  07/28/2002  Job:  [id number]

## 2010-10-10 NOTE — Assessment & Plan Note (Signed)
[facility]                               PULMONARY OFFICE NOTE   JENNINGS, STIRLING                  MRN:          [id number]  DATE:03/02/2006                            DOB:          08/22/1935    PRIMARY PHYSICIAN:  Dr. Posey Rea   ENT:  Dr. Narda Bonds   PROBLEM:  1. Obstructive sleep apnea with hypersomnia.  2. Congestive heart failure.  3. Mild chronic obstructive pulmonary disease.  4. Exogenous obesity.  5. Glaucoma.  6. Decreased hearing/vertigo.   HISTORY:  She comes with her husband today.  It has been a difficult summer.  She is working with Dr. Narda Bonds on diminished hearing for which she was  given a prednisone taper, maybe with some benefit.  She also has had some  vertigo.  An MRI of her head she says was negative in workup for this.  She  says she has not been sleeping well because she has been hot, but she and  her husband both say that she has had no problem at all with CPAP at 9CWP.  That prevents her snoring and prevents any evident apnea.  She never tried  the Spiriva we gave and I explained that because of the glaucoma we should  stay off of it now.  She has not had any significant respiratory event and  exertional dyspnea is pretty minimal.   MEDICATION:  1. Synthroid 150 mg.  2. Torsemide 20 mg up to 3 daily.  3. Altace 20 mg.  4. Provigil 200 mg.  5. Foltx tablets.  6. Wellbutrin 75 mg.  7. Tramadol 50 mg as needed.  8. Clorazepate 7.5 mg b.i.d.  9. Prilosec OTC.  10.Aspirin 81 mg.  11.Iron 240 mg.   She is on a list of eye drops now, which have not seemed to change her  breathing.   OBJECTIVE:  Weight 217 pounds.  Blood pressure 128/70.  Pulse regular 62.  Room air saturation 97%.  She was not in obvious distress but did have to lean forward to listen to me  and was wearing 2 hearing aides.  She seemed alert with no pressure marks on her face from CPAP.  No nasal congestion.  LUNGS:  Fields  were quiet. I heard no wheeze or cough.  HEART:  Sounds were regular without murmur.   IMPRESSION:  1. Her obstructive sleep apnea component is under good control and well      tolerated with the CPAP.  We will not change that.  2. Her mild chronic obstructive pulmonary disease is probably not a real      issue for now.  Hopefully her glaucoma medications will not aggravate      obstructive airways disease .  3. Insomnia I think is correctly assessed by her and her husband as mostly      reflecting anxiety and stress of her various health problems and      adjustments she is having to make right now.  She declines medication      to help sleep and we reviewed sleep hygiene.  She also  declines flu      shot.   PLAN:  Return in 6 months, earlier as needed.       Clinton D. Maple Hudson, MD, FCCP, FACP      CDY/MedQ  DD:  03/02/2006  DT:  03/04/2006  Job #:  [id number]   cc:   Kristine Garbe. Ezzard Standing, M.D.
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
## 2010-10-10 NOTE — Op Note (Signed)
   NAME:  Wendy Cole, Wendy Cole                     ACCOUNT NO.:  [id number]   MEDICAL RECORD NO.:  [id number]                   PATIENT TYPE:  AMB   LOCATION:  ENDO                                 FACILITY:  [facility]   PHYSICIAN:  John C. Madilyn Fireman, M.D.                 DATE OF BIRTH:  August 18, 1935   DATE OF PROCEDURE:  11/13/2002  DATE OF DISCHARGE:                                 OPERATIVE REPORT   PROCEDURE:  Colonoscopy with polypectomy.   INDICATION FOR PROCEDURE:  Anemia and heme-positive stools.   PROCEDURE:  The patient was placed in the left lateral decubitus position  and placed on the pulse monitor with continuous low-flow oxygen delivered by  nasal cannula.  She was sedated with 150 mcg IV fentanyl and 13.5 mg of IV  Versed.  The Olympus video colonoscope was inserted into the rectum and  advanced its entire length.  However, despite multiple torquing maneuvers,  abdominal pressure, and position changes, the cecum could not be reached.  It was not certain but felt that the point of most proximal advancement was  somewhere in the proximal transverse colon.  The prep was excellent.  The  most proximal areas examined were normal with no masses, polyps,  diverticula, or other mucosal abnormalities.  In what was felt to be the  distal transverse colon, there was a 1.2 cm sessile polyp removed by snare.  In the descending colon there was as larger 2.5 cm broadly pedunculated  polyp, which was removed by snare and sent in a separate specimen container.  The remainder of the descending, sigmoid, and rectum appeared normal with no  further polyps, masses, diverticula, or other mucosal abnormalities.  The  scope was then withdrawn and the patient returned to the recovery room in  stable condition.  She tolerated the procedure well, and there were no  immediate complications.   IMPRESSION:  1. Incomplete colonoscopy estimated to the proximal transverse colon.  2. Two colon polyps, one  fairly large.   PLAN:  Await histology to rule out malignancy and if no surgery indicated,  will need a barium enema within the next few weeks to image the proximal  colon.                                               John C. Madilyn Fireman, M.D.    JCH/MEDQ  D:  11/13/2002  T:  11/14/2002  Job:  [id number]   cc:   [name]. Lenord Fellers, M.D.  [address] [place]  [place]  [place] [address]  Fax: [phone]

## 2010-10-10 NOTE — Assessment & Plan Note (Signed)
[facility]                             PULMONARY OFFICE NOTE   Wendy, Cole                  MRN:          [id number]  DATE:08/31/2006                            DOB:          05/31/1935    PRIMARY CARE PHYSICIAN:  Dr. Posey Rea.   ENT:  Dr. Narda Bonds.   PROBLEM:  1. Obstructive sleep apnea with hypersomnia.  2. Congestive heart failure.  3. Mild chronic obstructive pulmonary disease.  4. Exogenous obesity.  5. Glaucoma.  6. Decreased hearing/vertigo.   HISTORY:  She returns with her husband for a 6 month followup, saying  she did well through the winter.  Had glaucoma surgery.  No concerns  about her breathing or her sleep apnea.  They did want to know how long  the CPAP machine might last, because it is giving them no problems at  all, set at 9 CWP.  Once or twice she has forgotten to turn it on and  noticed the difference, but her husband says that she is very good about  wearing it, and there is no breakthrough snoring.  We reviewed her  medication list which is charted.   OBJECTIVE:  Weight 220 pounds, BP 116/64, pulse regular 64, room air  saturation 97%.  She seems alert and comfortable.  There are no pressure marks on her  face from the mask.  Nasal airways are not obstructed.  Lungs are clear with no cough or wheeze.  Heart sounds regular without murmur.   IMPRESSION:  Obstructive sleep apnea is well controlled.  It would help  to lose weight, certainly not to gain.  We discussed comfort and  maintenance issues of continuous positive airway pressure.   PLAN:  Schedule return 1 year, earlier p.r.n.  Continue continuous  positive airway pressure at 9 CWP.     Clinton D. Maple Hudson, MD, Wendy Cole, FACP  Electronically Signed    CDY/MedQ  DD: 08/31/2006  DT: 08/31/2006  Job #: [id number]

## 2010-10-10 NOTE — H&P (Signed)
NAME:  Wendy Cole, Wendy Cole                     ACCOUNT NO.:  [id number]   MEDICAL RECORD NO.:  [id number]                   PATIENT TYPE:  INP   LOCATION:  2021                                 FACILITY:  [facility]   PHYSICIAN:  Lianne Carreto. Lenord Fellers, M.D.                DATE OF BIRTH:  09-01-1935   DATE OF ADMISSION:  07/18/2002  DATE OF DISCHARGE:  07/20/2002                                HISTORY & PHYSICAL   CHIEF COMPLAINT:  Dropping things with right hand and numbness of the right  face.   HISTORY OF PRESENT ILLNESS:  This 75 year old overweight white female with  history of hypertension, hyperlipidemia, cigarette abuse, GE reflux,  dependent edema, hypothyroidism,  renal insufficiency and proteinuria came  to the office today with complaint of slurred speech, right hand clumsiness  and paresthesias of the right face.  Onset around 7 a.m. today.  Clumsiness  in right hand has improved.  Right facial paresthesias remain and speech is  okay now.  She has no prior history of stroke.  The patient has a history of  sleep apnea and uses CPAP.  She was sent to the emergency department and  subsequently admitted to rule out CVA.   PAST MEDICAL HISTORY:  The patient has history of partial thyroidectomy in  1969 and now takes thyroid replacement therapy.  History of GE reflux  treated with over-the-counter Prilosec.  History of hypertension and  dependent edema.  History of benign left breast biopsy in 1972.  Hysterectomy without oophorectomy for heavy bleeding in 1974.  Right parotid  gland removed in 2000 by Dr. Pollyann Kennedy for benign mass.  Hospitalized at [facility] in the 1970s with depression.  She has smoked for some 44 years.  Currently smokes 1/2 to a pack of cigarettes daily.  There is a family  history of cerebral aneurysm in her sister who died of that.  The patient  had a negative MRI of the brain in 1997.  In 1998, had a workup for near  syncope with a negative Cardiolite study by Dr.  Primitivo Gauze of [facility].  She has a history of iron deficiency anemia which I diagnosed  in 2003.  It is not clear to me the cause of that.  I had set her up to see  Dr. Madilyn Fireman and a colonoscopy was scheduled for 6/03, but the patient's  husband had to have emergency bypass surgery and she never rescheduled the  colonoscopy.   CURRENT MEDICATIONS:  1. Synthroid 0.1 mg daily.  2. Prilosec 20 mg daily.  3. Tranxene 7.5 mg t.i.d. p.r.n.  for anxiety.  4. Demadex 20 mg b.i.d.  5. Lipitor 20 mg daily.  6. Altace 10 mg daily.  7. Wellbutrin 200 mg b.i.d.  8. CPAP for sleep apnea.  9. I had prescribed aspirin for her in 2003, but the patient  currently is     not taking it  and apparently has not taken it for at least several weeks.   Last mammogram was in 4/03.  Tetanus immunization given in 1999.  History of  osteopenia and COPD.   The patient  left this practice during the summer of 2003 due to an  insurance change and has been followed more recently at [facility], but now wants to come back to this practice.   SOCIAL HISTORY:  She is married and has three adult children.  One son  resides with her in his 30s who has  mental retardation and works in a  Warehouse manager in [employer].  The patient  admits to social alcohol  consumption.  Husband is employed in the Landscape architect in [employer]  and apparently owns a Facilities manager.  He is of [place] descent and is 74  years old and apparently has a history of sarcoma of the leg and is status  post an amputation of his leg.   FAMILY HISTORY:  Sister died of a cerebral aneurysm. Another sister with  history of hypertension.  Father died of cardiac arrest.  Mother living in  her 17s with history of breast cancer, CVA and myocardial infarction.   REVIEW OF SYSTEMS:  Occasional sharp head pain, frontal in nature since  Christmas lasting for a few seconds.  Over the last several weeks, more  frequent  headaches.   PHYSICAL EXAMINATION:  VITAL SIGNS:  Temperature 97.6 degrees orally, pulse  74 regular, blood pressure 150/64 left arm large cuff, 152/64 right arm  large cuff.  Weight 238 pounds.  SKIN:  Warm and dry.  NODES:  None.  HEENT:  Head is normocephalic, atraumatic.  Sclerae and conjunctivae are  clear.  Funduscopic exam is benign.  TMs are clear.  Pharynx is clear, but  uvula is deviated to the left which she says is the  result of parotid  surgery done in the year 2000.  Tongue is midline.  No significant facial  droop.  NECK:  Supple without thyromegaly or carotid bruits.  CHEST:  Clear.  CARDIAC:  Regular  rate and rhythm.  Normal S1, S2.  ABDOMEN:  No hepatosplenomegaly, masses, or tenderness.  EXTREMITIES:  No pitting edema.  NEUROLOGIC:  Remarkable for significant paresthesias on the right face.  Grips are equal bilaterally at this point.  Lower extremity strength testing  is 5/5.  Deep tendon reflexes 2+ and symmetrical.  Cerebellar finger-to-nose  testing is normal.  Cranial nerves II-XII are grossly intact exception being  paresthesias right face.  No significant decreased nasolabial fold.  Alert  and oriented x3.  Gait is normal.   Labs are pending.   IMPRESSION:  1. Left brain transient ischemic attack versus cerebrovascular accident.  2. Hypertension.  3. Hyperlipidemia.  4. Sleep apnea.  5. Obesity.  6. Cigarette abuse.  7. Gastroesophageal reflux.  8. Osteopenia.  9. Chronic obstructive pulmonary disease.  10.      Anxiety/depression.  11.      Dependent edema.  12.      Renal insufficiency and proteinuria.  13.      Hypothyroidism.  14.      History of iron deficiency anemia.    PLAN:  The patient will be admitted to a telemetry unit and will be  monitored for arrhythmias.  Will have CT of the brain without contrast and  proceed afterwards with MRI/MRA, carotid Dopplers, 2-D echocardiogram,  neurological consultation by neurologist.  [name]. Lenord Fellers, M.D.    MJB/MEDQ  D:  07/27/2002  T:  07/28/2002  Job:  [id number]   cc:   Santina Evans A. Orlin Hilding, M.D.  [address]  [address]  [place]  [place] [address]  Fax: [phone]
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
## 2010-10-13 ENCOUNTER — Encounter: Payer: Self-pay | Admitting: Internal Medicine

## 2010-10-17 ENCOUNTER — Encounter: Payer: Self-pay | Admitting: Internal Medicine

## 2010-10-17 ENCOUNTER — Ambulatory Visit (INDEPENDENT_AMBULATORY_CARE_PROVIDER_SITE_OTHER): Payer: Medicare Other | Admitting: Internal Medicine

## 2010-10-17 VITALS — BP 138/60 | HR 63 | Ht 66.0 in | Wt 237.4 lb

## 2010-10-17 DIAGNOSIS — G4733 Obstructive sleep apnea (adult) (pediatric): Secondary | ICD-10-CM

## 2010-10-17 DIAGNOSIS — J449 Chronic obstructive pulmonary disease, unspecified: Secondary | ICD-10-CM

## 2010-10-17 MED ORDER — FLUTICASONE-SALMETEROL 100-50 MCG/DOSE IN AEPB: 1.0000 | INHALATION_SPRAY | Freq: Two times a day (BID) | RESPIRATORY_TRACT | Status: DC

## 2010-10-17 MED ORDER — BUPROPION HCL ER (SR) 150 MG PO TB12: 150.0000 mg | ORAL_TABLET | Freq: Every day | ORAL | Status: DC

## 2010-10-17 NOTE — Assessment & Plan Note (Signed)
She is unaware of her wheezing, making it harder to assess. Also she seems unimpressed that it should be treated. I discussed lisinopril and timolol for their potential roles.  For now, we will let her try Advair to see if she notices a worthwhile improvement.

## 2010-10-17 NOTE — Assessment & Plan Note (Addendum)
Good compliance and control with no need to change pressure. Weight loss would help. CPAP 10

## 2010-10-17 NOTE — Progress Notes (Signed)
  Subjective:    [name], female    DOB: 04-Jul-1935, 75 y.o.   MRN: [id number]  HPI 10/17/10- 66 yoF former smoker followed for OSA and COPD, complicated by DM, hx breast cancer, hx DVT. Here with husband. Last here Oct 17, 2009- note reviewed.  She denies any significant breathing issues since last here. Continues to use CPAP at 10 all night every night. Never recurrence of DVT left leg.  Noted minor cough when first lying down- they are not concerned.   Review of Systems Constitutional:   No weight loss, night sweats,  Fevers, chills, fatigue, lassitude. HEENT:   No headaches,  Difficulty swallowing,  Tooth/dental problems,  Sore throat,                No sneezing, itching, ear ache, nasal congestion, post nasal drip,   CV:  No chest pain,  Orthopnea, PND, swelling in lower extremities, anasarca, dizziness, palpitations  GI  No heartburn, indigestion, abdominal pain, nausea, vomiting, diarrhea, change in bowel habits, loss of appetite  Resp: No shortness of breath with exertion or at rest.  No excess mucus, no productive cough,  No non-productive cough,  No coughing up of blood.  No change in color of mucus.  No wheezing.   Skin: no rash or lesions.  GU: no dysuria, change in color of urine, no urgency or frequency.  No flank pain.  MS:  No joint pain or swelling.  No decreased range of motion.  No back pain.  Psych:  No change in mood or affect. No depression or anxiety.  No memory loss.      Objective:   Physical Exam General- Alert, Oriented, Affect-appropriate, Distress- none acute   Over weight  Skin- rash-none, lesions- none, excoriation- none  Lymphadenopathy- none  Head- atraumatic  Eyes- Gross vision intact, PERRLA, conjunctivae clear secretions  Ears- Hearing--- deaf left ear     Nose- Clear, No- Septal dev, mucus, polyps, erosion, perforation   Throat- Mallampati II , mucosa clear , drainage- none, tonsils- atrophic  Neck- flexible ,  trachea midline, no stridor , thyroid nl, carotid no bruit  Chest - symmetrical excursion , unlabored     Heart/CV- RRR , no murmur , no gallop  , no rub, nl s1 s2                     - JVD- none , edema- none, stasis changes- none, varices- none     Lung- clear to P&A, wheeze-mild bilateral unlabored., cough- none , dullness-none, rub- none     Chest wall-   Abd- tender-no, distended-no, bowel sounds-present, HSM- no  Br/ Gen/ Rectal- Not done, not indicated  Extrem- cyanosis- none, clubbing, none, atrophy- none, strength- nl  Neuro- grossly intact to observation         Assessment & Plan:

## 2010-10-17 NOTE — Patient Instructions (Signed)
Sample and script for Advair 100/50      1 puff and rinse mouth, twice every day. This is to maintain your airways clear and open.

## 2010-10-28 ENCOUNTER — Other Ambulatory Visit (INDEPENDENT_AMBULATORY_CARE_PROVIDER_SITE_OTHER): Payer: Medicare Other

## 2010-10-28 ENCOUNTER — Other Ambulatory Visit: Payer: Self-pay | Admitting: Internal Medicine

## 2010-10-28 DIAGNOSIS — M109 Gout, unspecified: Secondary | ICD-10-CM

## 2010-10-28 DIAGNOSIS — E119 Type 2 diabetes mellitus without complications: Secondary | ICD-10-CM

## 2010-10-28 DIAGNOSIS — D509 Iron deficiency anemia, unspecified: Secondary | ICD-10-CM

## 2010-10-28 LAB — CBC WITH DIFFERENTIAL/PLATELET
Basophils Relative: 0.9 % (ref 0.0–3.0)
Eosinophils Relative: 3.8 % (ref 0.0–5.0)
HCT: 35.3 % — ABNORMAL LOW (ref 36.0–46.0)
Monocytes Relative: 7.8 % (ref 3.0–12.0)
Neutrophils Relative %: 75.3 % (ref 43.0–77.0)
Platelets: 324 10*3/uL (ref 150.0–400.0)
RBC: 3.91 Mil/uL (ref 3.87–5.11)
WBC: 6.9 10*3/uL (ref 4.5–10.5)

## 2010-10-28 LAB — BASIC METABOLIC PANEL
Chloride: 107 mEq/L (ref 96–112)
Potassium: 5.5 mEq/L — ABNORMAL HIGH (ref 3.5–5.1)

## 2010-10-28 LAB — URIC ACID: Uric Acid, Serum: 8.9 mg/dL — ABNORMAL HIGH (ref 2.4–7.0)

## 2010-10-28 LAB — HEMOGLOBIN A1C: Hgb A1c MFr Bld: 6.5 % (ref 4.6–6.5)

## 2010-10-31 ENCOUNTER — Encounter (HOSPITAL_BASED_OUTPATIENT_CLINIC_OR_DEPARTMENT_OTHER): Payer: Medicare Other | Admitting: Oncology

## 2010-10-31 ENCOUNTER — Other Ambulatory Visit: Payer: Self-pay | Admitting: Oncology

## 2010-10-31 DIAGNOSIS — I82409 Acute embolism and thrombosis of unspecified deep veins of unspecified lower extremity: Secondary | ICD-10-CM

## 2010-10-31 LAB — PROTIME-INR
INR: 3.3 (ref 2.00–3.50)
Protime: 39.6 Seconds — ABNORMAL HIGH (ref 10.6–13.4)

## 2010-11-04 ENCOUNTER — Ambulatory Visit (INDEPENDENT_AMBULATORY_CARE_PROVIDER_SITE_OTHER): Payer: Medicare Other | Admitting: Internal Medicine

## 2010-11-04 ENCOUNTER — Encounter: Payer: Self-pay | Admitting: Internal Medicine

## 2010-11-04 DIAGNOSIS — R609 Edema, unspecified: Secondary | ICD-10-CM

## 2010-11-04 DIAGNOSIS — I10 Essential (primary) hypertension: Secondary | ICD-10-CM

## 2010-11-04 DIAGNOSIS — N259 Disorder resulting from impaired renal tubular function, unspecified: Secondary | ICD-10-CM

## 2010-11-04 DIAGNOSIS — M545 Low back pain: Secondary | ICD-10-CM

## 2010-11-04 MED ORDER — FEBUXOSTAT 80 MG PO TABS: 1.0000 | ORAL_TABLET | ORAL | Status: DC

## 2010-11-04 MED ORDER — ENALAPRIL MALEATE 10 MG PO TABS: 10.0000 mg | ORAL_TABLET | Freq: Every day | ORAL | Status: DC

## 2010-11-04 NOTE — Assessment & Plan Note (Signed)
Better  

## 2010-11-04 NOTE — Assessment & Plan Note (Signed)
On rx 

## 2010-11-04 NOTE — Assessment & Plan Note (Signed)
Sub optimal control 

## 2010-11-04 NOTE — Patient Instructions (Signed)
Reduce Enalapril to 10 mg a day Take Uloric 1 a day in place of Allopurinol

## 2010-11-04 NOTE — Assessment & Plan Note (Signed)
D/c allopurinol

## 2010-11-04 NOTE — Progress Notes (Signed)
Subjective:    [name], female    DOB: 08/03/1935, 75 y.o.   MRN: [id number]  HPI  The patient presents for a follow-up of  chronic hypertension, chronic dyslipidemia, type 2 diabetes controlled with medicines    Review of Systems  Constitutional: Negative.  Negative for fever, chills, diaphoresis, activity change, appetite change, fatigue and unexpected weight change.  HENT: Negative for hearing loss, ear pain, nosebleeds, congestion, sore throat, facial swelling, rhinorrhea, sneezing, mouth sores, trouble swallowing, neck pain, neck stiffness, postnasal drip, sinus pressure and tinnitus.   Eyes: Negative for pain, discharge, redness, itching and visual disturbance.  Respiratory: Negative for cough, chest tightness, shortness of breath, wheezing and stridor.   Cardiovascular: Negative for chest pain, palpitations and leg swelling.  Gastrointestinal: Negative for nausea, diarrhea, constipation, blood in stool, abdominal distention, anal bleeding and rectal pain.  Genitourinary: Negative for dysuria, urgency, frequency, hematuria, flank pain, vaginal bleeding, vaginal discharge, difficulty urinating, genital sores and pelvic pain.  Musculoskeletal: Positive for myalgias, back pain, arthralgias and gait problem. Negative for joint swelling.  Skin: Negative.  Negative for rash.  Neurological: Negative for dizziness, tremors, seizures, syncope, speech difficulty, weakness, numbness and headaches.  Hematological: Negative for adenopathy. Does not bruise/bleed easily.  Psychiatric/Behavioral: Negative for suicidal ideas, behavioral problems, sleep disturbance (CPAP), dysphoric mood and decreased concentration. The patient is not nervous/anxious.    Wt Readings from Last 3 Encounters:  11/04/10 233 lb (105.688 kg)  10/17/10 237 lb 6.4 oz (107.684 kg)  08/06/10 235 lb (106.595 kg)       Objective:   Physical Exam  Constitutional: She is oriented to person, place, and  time. She appears well-developed and well-nourished. No distress.  HENT:  Head: Normocephalic.  Right Ear: External ear normal.  Left Ear: External ear normal.  Nose: Nose normal.  Mouth/Throat: Oropharynx is clear and moist.  Eyes: Conjunctivae are normal. Pupils are equal, round, and reactive to light. Right eye exhibits no discharge. Left eye exhibits no discharge.  Neck: Normal range of motion. Neck supple. No JVD present. No tracheal deviation present. No thyromegaly present.  Cardiovascular: Normal rate, regular rhythm and normal heart sounds.   Pulmonary/Chest: No stridor. No respiratory distress. She has no wheezes.  Abdominal: Soft. Bowel sounds are normal. She exhibits no distension and no mass. There is no tenderness. There is no rebound and no guarding.  Musculoskeletal: She exhibits tenderness. She exhibits no edema.       Gouty MCPs  Lymphadenopathy:    She has no cervical adenopathy.  Neurological: She is alert and oriented to person, place, and time. She displays normal reflexes. No cranial nerve deficit. She exhibits normal muscle tone. Coordination abnormal.  Skin: No rash noted. No erythema.  Psychiatric: She has a normal mood and affect. Her behavior is normal. Judgment and thought content normal.        Lab Results  Component Value Date   WBC 6.9 10/28/2010   HGB 11.9* 10/28/2010   HCT 35.3* 10/28/2010   PLT 324.0 10/28/2010   CHOL 234* 11/20/2009   TRIG 238.0* 11/20/2009   HDL 37.70* 11/20/2009   LDLDIRECT 152.8 11/20/2009   ALT 15 05/29/2010   AST 20 05/29/2010   NA 138 10/28/2010   K 5.5* 10/28/2010   CL 107 10/28/2010   CREATININE 1.7* 10/28/2010   BUN 38* 10/28/2010   CO2 24 10/28/2010   TSH 2.48 05/29/2010   INR 3.30 10/31/2010   HGBA1C 6.5 10/28/2010   MICROALBUR  3.1* 09/27/2006     Assessment & Plan:

## 2010-11-13 ENCOUNTER — Other Ambulatory Visit: Payer: Self-pay | Admitting: Internal Medicine

## 2010-11-14 ENCOUNTER — Other Ambulatory Visit: Payer: Self-pay | Admitting: Oncology

## 2010-11-14 ENCOUNTER — Encounter (HOSPITAL_BASED_OUTPATIENT_CLINIC_OR_DEPARTMENT_OTHER): Payer: Medicare Other | Admitting: Oncology

## 2010-11-14 DIAGNOSIS — Z7901 Long term (current) use of anticoagulants: Secondary | ICD-10-CM

## 2010-11-14 DIAGNOSIS — I82509 Chronic embolism and thrombosis of unspecified deep veins of unspecified lower extremity: Secondary | ICD-10-CM

## 2010-11-14 DIAGNOSIS — C50919 Malignant neoplasm of unspecified site of unspecified female breast: Secondary | ICD-10-CM

## 2010-11-14 DIAGNOSIS — Z170 Estrogen receptor positive status [ER+]: Secondary | ICD-10-CM

## 2010-11-14 LAB — PROTIME-INR
INR: 2.9 (ref 2.00–3.50)
Protime: 34.8 Seconds — ABNORMAL HIGH (ref 10.6–13.4)

## 2010-12-15 ENCOUNTER — Other Ambulatory Visit: Payer: Self-pay | Admitting: Oncology

## 2010-12-15 ENCOUNTER — Encounter (HOSPITAL_BASED_OUTPATIENT_CLINIC_OR_DEPARTMENT_OTHER): Payer: Medicare Other | Admitting: Oncology

## 2010-12-15 DIAGNOSIS — Z7901 Long term (current) use of anticoagulants: Secondary | ICD-10-CM

## 2010-12-15 DIAGNOSIS — Z170 Estrogen receptor positive status [ER+]: Secondary | ICD-10-CM

## 2010-12-15 DIAGNOSIS — I82509 Chronic embolism and thrombosis of unspecified deep veins of unspecified lower extremity: Secondary | ICD-10-CM

## 2010-12-15 DIAGNOSIS — C50919 Malignant neoplasm of unspecified site of unspecified female breast: Secondary | ICD-10-CM

## 2011-01-15 ENCOUNTER — Encounter (HOSPITAL_BASED_OUTPATIENT_CLINIC_OR_DEPARTMENT_OTHER): Payer: Medicare Other | Admitting: Oncology

## 2011-01-15 ENCOUNTER — Other Ambulatory Visit: Payer: Self-pay | Admitting: Oncology

## 2011-01-15 DIAGNOSIS — Z7901 Long term (current) use of anticoagulants: Secondary | ICD-10-CM

## 2011-01-15 DIAGNOSIS — Z170 Estrogen receptor positive status [ER+]: Secondary | ICD-10-CM

## 2011-01-15 DIAGNOSIS — I82509 Chronic embolism and thrombosis of unspecified deep veins of unspecified lower extremity: Secondary | ICD-10-CM

## 2011-01-15 DIAGNOSIS — C50919 Malignant neoplasm of unspecified site of unspecified female breast: Secondary | ICD-10-CM

## 2011-01-15 LAB — PROTIME-INR: INR: 3.2 (ref 2.00–3.50)

## 2011-01-27 ENCOUNTER — Other Ambulatory Visit (INDEPENDENT_AMBULATORY_CARE_PROVIDER_SITE_OTHER): Payer: Medicare Other

## 2011-01-27 DIAGNOSIS — R609 Edema, unspecified: Secondary | ICD-10-CM

## 2011-01-27 DIAGNOSIS — N259 Disorder resulting from impaired renal tubular function, unspecified: Secondary | ICD-10-CM

## 2011-01-27 LAB — URIC ACID: Uric Acid, Serum: 5.3 mg/dL (ref 2.4–7.0)

## 2011-01-27 LAB — COMPREHENSIVE METABOLIC PANEL
CO2: 24 mEq/L (ref 19–32)
Creatinine, Ser: 1.9 mg/dL — ABNORMAL HIGH (ref 0.4–1.2)
GFR: 28.06 mL/min — ABNORMAL LOW (ref >60.00)
Glucose, Bld: 112 mg/dL — ABNORMAL HIGH (ref 70–99)
Total Bilirubin: 0.5 mg/dL (ref 0.3–1.2)
Total Protein: 6.9 g/dL (ref 6.0–8.3)

## 2011-01-29 ENCOUNTER — Encounter (HOSPITAL_BASED_OUTPATIENT_CLINIC_OR_DEPARTMENT_OTHER): Payer: Medicare Other | Admitting: Oncology

## 2011-01-29 ENCOUNTER — Other Ambulatory Visit: Payer: Self-pay | Admitting: Oncology

## 2011-01-29 DIAGNOSIS — Z7901 Long term (current) use of anticoagulants: Secondary | ICD-10-CM

## 2011-01-29 DIAGNOSIS — I82509 Chronic embolism and thrombosis of unspecified deep veins of unspecified lower extremity: Secondary | ICD-10-CM

## 2011-01-29 DIAGNOSIS — Z170 Estrogen receptor positive status [ER+]: Secondary | ICD-10-CM

## 2011-01-29 DIAGNOSIS — Z5181 Encounter for therapeutic drug level monitoring: Secondary | ICD-10-CM

## 2011-01-29 LAB — PROTIME-INR: INR: 3.7 — ABNORMAL HIGH (ref 2.00–3.50)

## 2011-02-03 ENCOUNTER — Encounter: Payer: Self-pay | Admitting: Internal Medicine

## 2011-02-03 ENCOUNTER — Ambulatory Visit (INDEPENDENT_AMBULATORY_CARE_PROVIDER_SITE_OTHER): Payer: Medicare Other | Admitting: Internal Medicine

## 2011-02-03 VITALS — BP 160/90 | HR 80 | Temp 98.4°F | Resp 16 | Wt 235.0 lb

## 2011-02-03 DIAGNOSIS — N189 Chronic kidney disease, unspecified: Secondary | ICD-10-CM

## 2011-02-03 DIAGNOSIS — M545 Low back pain, unspecified: Secondary | ICD-10-CM

## 2011-02-03 DIAGNOSIS — R609 Edema, unspecified: Secondary | ICD-10-CM

## 2011-02-03 DIAGNOSIS — N259 Disorder resulting from impaired renal tubular function, unspecified: Secondary | ICD-10-CM

## 2011-02-03 DIAGNOSIS — C50919 Malignant neoplasm of unspecified site of unspecified female breast: Secondary | ICD-10-CM

## 2011-02-03 DIAGNOSIS — E119 Type 2 diabetes mellitus without complications: Secondary | ICD-10-CM

## 2011-02-03 DIAGNOSIS — Z23 Encounter for immunization: Secondary | ICD-10-CM

## 2011-02-03 MED ORDER — ANASTROZOLE 1 MG PO TABS: 1.0000 mg | ORAL_TABLET | Freq: Every day | ORAL | Status: DC

## 2011-02-03 MED ORDER — CARVEDILOL 12.5 MG PO TABS: 12.5000 mg | ORAL_TABLET | Freq: Two times a day (BID) | ORAL | Status: DC

## 2011-02-03 NOTE — Patient Instructions (Signed)
Do not take Aleve

## 2011-02-03 NOTE — Progress Notes (Signed)
  Subjective:    [name], female    DOB: [birth year], 75 y.o.   MRN: [id number]  HPI  The patient presents for a follow-up of  chronic hypertension, chronic dyslipidemia, type 2 diabetes controlled with medicines C/o OA pains - 7-8/10 at times. Not taking much Tramadol. Took Aleve. Meds do work for pain      Review of Systems  Constitutional: Negative for chills, activity change, appetite change, fatigue and unexpected weight change.  HENT: Negative for congestion, mouth sores and sinus pressure.   Eyes: Negative for visual disturbance.  Respiratory: Negative for cough and chest tightness.   Gastrointestinal: Negative for nausea and abdominal pain.  Genitourinary: Negative for frequency, difficulty urinating and vaginal pain.  Musculoskeletal: Positive for back pain, arthralgias and gait problem.  Skin: Negative for pallor and rash.  Neurological: Negative for dizziness, tremors, weakness, numbness and headaches.  Psychiatric/Behavioral: Positive for dysphoric mood. Negative for confusion and sleep disturbance.   BP Readings from Last 3 Encounters:  02/03/11 160/90  11/04/10 140/70  10/17/10 138/60   Wt Readings from Last 3 Encounters:  02/03/11 235 lb (106.595 kg)  11/04/10 233 lb (105.688 kg)  10/17/10 237 lb 6.4 oz (107.684 kg)       Objective:   Physical Exam  Constitutional: She appears well-developed and well-nourished. No distress.  HENT:  Head: Normocephalic.  Right Ear: External ear normal.  Left Ear: External ear normal.  Nose: Nose normal.  Mouth/Throat: Oropharynx is clear and moist.  Eyes: Conjunctivae are normal. Pupils are equal, round, and reactive to light. Right eye exhibits no discharge. Left eye exhibits no discharge.  Neck: Normal range of motion. Neck supple. No JVD present. No tracheal deviation present. No thyromegaly present.  Cardiovascular: Normal rate, regular rhythm and normal heart sounds.   Pulmonary/Chest: No  stridor. No respiratory distress. She has no wheezes.  Abdominal: Soft. Bowel sounds are normal. She exhibits no distension and no mass. There is no tenderness. There is no rebound and no guarding.  Musculoskeletal: She exhibits tenderness. She exhibits no edema.  Lymphadenopathy:    She has no cervical adenopathy.  Neurological: She displays normal reflexes. No cranial nerve deficit. She exhibits normal muscle tone. Coordination normal.  Skin: No rash noted. No erythema.  Psychiatric: Her behavior is normal. Judgment and thought content normal.      Lab Results  Component Value Date   WBC 6.9 10/28/2010   HGB 11.9* 10/28/2010   HCT 35.3* 10/28/2010   PLT 324.0 10/28/2010   CHOL 234* 11/20/2009   TRIG 238.0* 11/20/2009   HDL 37.70* 11/20/2009   LDLDIRECT 152.8 11/20/2009   ALT 15 01/27/2011   AST 18 01/27/2011   NA 139 01/27/2011   K 4.9 01/27/2011   CL 106 01/27/2011   CREATININE 1.9* 01/27/2011   BUN 32* 01/27/2011   CO2 24 01/27/2011   TSH 2.48 05/29/2010   INR 3.70* 01/29/2011   HGBA1C 6.5 10/28/2010   MICROALBUR 3.1* 09/27/2006      Assessment & Plan:

## 2011-02-03 NOTE — Assessment & Plan Note (Signed)
Continue with current prescription therapy as reflected on the Med list.  

## 2011-02-03 NOTE — Assessment & Plan Note (Signed)
Better Continue with current prescription therapy as reflected on the Med list.  

## 2011-02-03 NOTE — Assessment & Plan Note (Signed)
Continue with current prescription therapy as reflected on the Med list. Added Coreg.

## 2011-02-05 ENCOUNTER — Other Ambulatory Visit: Payer: Self-pay | Admitting: Oncology

## 2011-02-05 ENCOUNTER — Encounter (HOSPITAL_BASED_OUTPATIENT_CLINIC_OR_DEPARTMENT_OTHER): Payer: Medicare Other | Admitting: Oncology

## 2011-02-05 DIAGNOSIS — I82509 Chronic embolism and thrombosis of unspecified deep veins of unspecified lower extremity: Secondary | ICD-10-CM

## 2011-02-05 DIAGNOSIS — C50919 Malignant neoplasm of unspecified site of unspecified female breast: Secondary | ICD-10-CM

## 2011-02-05 DIAGNOSIS — Z170 Estrogen receptor positive status [ER+]: Secondary | ICD-10-CM

## 2011-02-05 DIAGNOSIS — Z7901 Long term (current) use of anticoagulants: Secondary | ICD-10-CM

## 2011-02-12 ENCOUNTER — Other Ambulatory Visit: Payer: Self-pay | Admitting: Oncology

## 2011-02-12 ENCOUNTER — Encounter (HOSPITAL_BASED_OUTPATIENT_CLINIC_OR_DEPARTMENT_OTHER): Payer: Medicare Other | Admitting: Oncology

## 2011-02-12 DIAGNOSIS — I82509 Chronic embolism and thrombosis of unspecified deep veins of unspecified lower extremity: Secondary | ICD-10-CM

## 2011-02-12 LAB — PROTIME-INR: INR: 3.4 (ref 2.00–3.50)

## 2011-02-17 LAB — URINALYSIS, ROUTINE W REFLEX MICROSCOPIC
Bilirubin Urine: NEGATIVE
Hgb urine dipstick: NEGATIVE
Nitrite: NEGATIVE
Specific Gravity, Urine: 1.008
pH: 5

## 2011-02-17 LAB — CARDIAC PANEL(CRET KIN+CKTOT+MB+TROPI)
CK, MB: 13.4 — ABNORMAL HIGH
CK, MB: 5.3 — ABNORMAL HIGH
Relative Index: 12.9 — ABNORMAL HIGH
Relative Index: 5.1 — ABNORMAL HIGH
Relative Index: INVALID
Relative Index: INVALID
Total CK: 104
Total CK: 81
Troponin I: 0.01
Troponin I: 0.01

## 2011-02-17 LAB — CBC
Hemoglobin: 11.1 — ABNORMAL LOW
RBC: 3.82 — ABNORMAL LOW
RDW: 15

## 2011-02-17 LAB — CULTURE, ROUTINE-ABSCESS

## 2011-02-17 LAB — COMPREHENSIVE METABOLIC PANEL
ALT: 24
AST: 28
Alkaline Phosphatase: 104
CO2: 19
Glucose, Bld: 91
Potassium: 5.5 — ABNORMAL HIGH
Sodium: 134 — ABNORMAL LOW
Total Protein: 7.3

## 2011-02-17 LAB — ANAEROBIC CULTURE

## 2011-02-17 LAB — B-NATRIURETIC PEPTIDE (CONVERTED LAB): Pro B Natriuretic peptide (BNP): 271 — ABNORMAL HIGH

## 2011-02-17 LAB — URIC ACID: Uric Acid, Serum: 5.4

## 2011-03-06 ENCOUNTER — Encounter: Payer: Self-pay | Admitting: Internal Medicine

## 2011-03-06 ENCOUNTER — Encounter (HOSPITAL_BASED_OUTPATIENT_CLINIC_OR_DEPARTMENT_OTHER): Payer: Medicare Other | Admitting: Oncology

## 2011-03-06 ENCOUNTER — Other Ambulatory Visit: Payer: Self-pay | Admitting: Oncology

## 2011-03-06 DIAGNOSIS — I82409 Acute embolism and thrombosis of unspecified deep veins of unspecified lower extremity: Secondary | ICD-10-CM

## 2011-03-06 DIAGNOSIS — C50919 Malignant neoplasm of unspecified site of unspecified female breast: Secondary | ICD-10-CM

## 2011-03-06 DIAGNOSIS — I82509 Chronic embolism and thrombosis of unspecified deep veins of unspecified lower extremity: Secondary | ICD-10-CM

## 2011-03-06 DIAGNOSIS — Z170 Estrogen receptor positive status [ER+]: Secondary | ICD-10-CM

## 2011-03-06 DIAGNOSIS — Z7901 Long term (current) use of anticoagulants: Secondary | ICD-10-CM

## 2011-03-06 LAB — CBC WITH DIFFERENTIAL/PLATELET
BASO%: 1.1 % (ref 0.0–2.0)
Basophils Absolute: 0.1 10*3/uL (ref 0.0–0.1)
EOS%: 4.1 % (ref 0.0–7.0)
HCT: 34.9 % (ref 34.8–46.6)
HGB: 11.4 g/dL — ABNORMAL LOW (ref 11.6–15.9)
LYMPH%: 13.4 % — ABNORMAL LOW (ref 14.0–49.7)
MCH: 29.2 pg (ref 25.1–34.0)
MCHC: 32.7 g/dL (ref 31.5–36.0)
MCV: 89.3 fL (ref 79.5–101.0)
NEUT%: 73.4 % (ref 38.4–76.8)
Platelets: 240 10*3/uL (ref 145–400)
lymph#: 0.8 10*3/uL — ABNORMAL LOW (ref 0.9–3.3)

## 2011-03-06 LAB — PROTIME-INR

## 2011-03-10 ENCOUNTER — Telehealth: Payer: Self-pay | Admitting: *Deleted

## 2011-03-10 MED ORDER — CLORAZEPATE DIPOTASSIUM 7.5 MG PO TABS: 7.5000 mg | ORAL_TABLET | Freq: Two times a day (BID) | ORAL | Status: DC | PRN

## 2011-03-10 NOTE — Telephone Encounter (Signed)
Rf req for Clorazepate 7.5mg  1 po qhs # 90. Last filled 06-06-2010. Ok to Rf in AVP's absence?

## 2011-03-10 NOTE — Telephone Encounter (Signed)
Apparently unable to print at this time due to printer not working

## 2011-03-11 ENCOUNTER — Telehealth: Payer: Self-pay | Admitting: Internal Medicine

## 2011-03-11 MED ORDER — CLORAZEPATE DIPOTASSIUM 7.5 MG PO TABS: 7.5000 mg | ORAL_TABLET | Freq: Two times a day (BID) | ORAL | Status: DC | PRN

## 2011-03-11 MED ORDER — CLORAZEPATE DIPOTASSIUM 7.5 MG PO TABS: 7.5000 mg | ORAL_TABLET | Freq: Every day | ORAL | Status: DC

## 2011-03-11 NOTE — Telephone Encounter (Deleted)
Rx Printed and placed on MD's desk for signature

## 2011-03-11 NOTE — Telephone Encounter (Signed)
Rf phoned in.  

## 2011-03-11 NOTE — Telephone Encounter (Signed)
Done hardcopy to dahlia/LIM B  

## 2011-03-31 ENCOUNTER — Other Ambulatory Visit (INDEPENDENT_AMBULATORY_CARE_PROVIDER_SITE_OTHER): Payer: Medicare Other

## 2011-03-31 DIAGNOSIS — N189 Chronic kidney disease, unspecified: Secondary | ICD-10-CM

## 2011-03-31 LAB — COMPREHENSIVE METABOLIC PANEL
ALT: 15 U/L (ref 0–35)
CO2: 23 mEq/L (ref 19–32)
Calcium: 9.3 mg/dL (ref 8.4–10.5)
Chloride: 108 mEq/L (ref 96–112)
Creatinine, Ser: 1.6 mg/dL — ABNORMAL HIGH (ref 0.4–1.2)
GFR: 32.63 mL/min — ABNORMAL LOW (ref >60.00)
Glucose, Bld: 101 mg/dL — ABNORMAL HIGH (ref 70–99)
Total Bilirubin: 0.6 mg/dL (ref 0.3–1.2)
Total Protein: 7.1 g/dL (ref 6.0–8.3)

## 2011-04-03 ENCOUNTER — Other Ambulatory Visit: Payer: Self-pay | Admitting: Oncology

## 2011-04-03 ENCOUNTER — Other Ambulatory Visit (HOSPITAL_BASED_OUTPATIENT_CLINIC_OR_DEPARTMENT_OTHER): Payer: Medicare Other

## 2011-04-03 DIAGNOSIS — Z170 Estrogen receptor positive status [ER+]: Secondary | ICD-10-CM

## 2011-04-03 DIAGNOSIS — Z7901 Long term (current) use of anticoagulants: Secondary | ICD-10-CM

## 2011-04-03 DIAGNOSIS — I82409 Acute embolism and thrombosis of unspecified deep veins of unspecified lower extremity: Secondary | ICD-10-CM

## 2011-04-03 DIAGNOSIS — I82509 Chronic embolism and thrombosis of unspecified deep veins of unspecified lower extremity: Secondary | ICD-10-CM

## 2011-04-03 LAB — PROTIME-INR: Protime: 33.6 Seconds — ABNORMAL HIGH (ref 10.6–13.4)

## 2011-04-07 ENCOUNTER — Telehealth: Payer: Self-pay | Admitting: *Deleted

## 2011-04-07 ENCOUNTER — Ambulatory Visit (INDEPENDENT_AMBULATORY_CARE_PROVIDER_SITE_OTHER): Payer: Medicare Other | Admitting: Internal Medicine

## 2011-04-07 VITALS — BP 154/72 | HR 55 | Temp 97.9°F | Wt 233.0 lb

## 2011-04-07 DIAGNOSIS — E119 Type 2 diabetes mellitus without complications: Secondary | ICD-10-CM

## 2011-04-07 DIAGNOSIS — I10 Essential (primary) hypertension: Secondary | ICD-10-CM

## 2011-04-07 DIAGNOSIS — N259 Disorder resulting from impaired renal tubular function, unspecified: Secondary | ICD-10-CM

## 2011-04-07 DIAGNOSIS — F411 Generalized anxiety disorder: Secondary | ICD-10-CM

## 2011-04-07 DIAGNOSIS — F329 Major depressive disorder, single episode, unspecified: Secondary | ICD-10-CM

## 2011-04-07 DIAGNOSIS — M353 Polymyalgia rheumatica: Secondary | ICD-10-CM

## 2011-04-07 DIAGNOSIS — F3289 Other specified depressive episodes: Secondary | ICD-10-CM

## 2011-04-07 DIAGNOSIS — R609 Edema, unspecified: Secondary | ICD-10-CM

## 2011-04-07 DIAGNOSIS — M109 Gout, unspecified: Secondary | ICD-10-CM

## 2011-04-07 MED ORDER — CARVEDILOL 25 MG PO TABS: 25.0000 mg | ORAL_TABLET | Freq: Two times a day (BID) | ORAL | Status: DC

## 2011-04-07 NOTE — Progress Notes (Signed)
Subjective:    [name], female    DOB: 1936/03/09, 75 y.o.   MRN: [id number]  HPI   The patient presents for a follow-up of  chronic hypertension, chronic dyslipidemia, type 2 diabetes, CRF, gout controlled with medicines   Review of Systems  Constitutional: Negative for chills, activity change, appetite change, fatigue and unexpected weight change.  HENT: Negative for congestion, mouth sores and sinus pressure.   Eyes: Negative for visual disturbance.  Respiratory: Positive for shortness of breath. Negative for cough and chest tightness.   Gastrointestinal: Negative for nausea and abdominal pain.  Genitourinary: Negative for frequency, difficulty urinating and vaginal pain.  Musculoskeletal: Positive for back pain and arthralgias. Negative for gait problem.  Skin: Negative for pallor and rash.  Neurological: Negative for dizziness, tremors, weakness, numbness and headaches.  Psychiatric/Behavioral: Negative for confusion and sleep disturbance.   BP Readings from Last 3 Encounters:  04/07/11 154/72  02/03/11 160/90  11/04/10 140/70   Wt Readings from Last 3 Encounters:  04/07/11 233 lb (105.688 kg)  02/03/11 235 lb (106.595 kg)  11/04/10 233 lb (105.688 kg)        Objective:   Physical Exam  Constitutional: She appears well-developed. No distress.       obese  HENT:  Head: Normocephalic.  Right Ear: External ear normal.  Left Ear: External ear normal.  Nose: Nose normal.  Mouth/Throat: Oropharynx is clear and moist.  Eyes: Conjunctivae are normal. Pupils are equal, round, and reactive to light. Right eye exhibits no discharge. Left eye exhibits no discharge.  Neck: Normal range of motion. Neck supple. No JVD present. No tracheal deviation present. No thyromegaly present.  Cardiovascular: Normal rate, regular rhythm and normal heart sounds.   Pulmonary/Chest: No stridor. No respiratory distress. She has no wheezes.  Abdominal: Soft. Bowel sounds  are normal. She exhibits no distension and no mass. There is no tenderness. There is no rebound and no guarding.  Musculoskeletal: She exhibits tenderness (LS is tender w/ROM). She exhibits no edema.       Gout tophi on fingers  Lymphadenopathy:    She has no cervical adenopathy.  Neurological: She displays normal reflexes. No cranial nerve deficit. She exhibits normal muscle tone. Coordination normal.  Skin: No rash noted. No erythema.  Psychiatric: She has a normal mood and affect. Her behavior is normal. Judgment and thought content normal.    Lab Results  Component Value Date   WBC 6.9 10/28/2010   HGB 11.4* 03/06/2011   HCT 34.9 03/06/2011   PLT 240 03/06/2011   GLUCOSE 101* 03/31/2011   CHOL 234* 11/20/2009   TRIG 238.0* 11/20/2009   HDL 37.70* 11/20/2009   LDLDIRECT 152.8 11/20/2009   LDLCALC  Value: 154        Total Cholesterol/HDL:CHD Risk Coronary Heart Disease Risk Table                     Men   Women  1/2 Average Risk   3.4   3.3  Average Risk       5.0   4.4  2 X Average Risk   9.6   7.1  3 X Average Risk  23.4   11.0        Use the calculated Patient Ratio above and the CHD Risk Table to determine the patient's CHD Risk.        ATP III CLASSIFICATION (LDL):  <100     mg/dL   Optimal  100-129  mg/dL   Near or Above                    Optimal  130-159  mg/dL   Borderline  161-096  mg/dL   High  >045     mg/dL   Very High* 08/31/8117   ALT 15 03/31/2011   AST 22 03/31/2011   NA 139 03/31/2011   K 4.5 03/31/2011   CL 108 03/31/2011   CREATININE 1.6* 03/31/2011   BUN 26* 03/31/2011   CO2 23 03/31/2011   TSH 2.48 05/29/2010   INR 2.80 04/03/2011   HGBA1C 6.5 10/28/2010   MICROALBUR 3.1* 09/27/2006         Assessment & Plan:

## 2011-04-07 NOTE — Assessment & Plan Note (Signed)
Continue with current prescription therapy as reflected on the Med list.  

## 2011-04-07 NOTE — Assessment & Plan Note (Signed)
Chronic, tophaceous Cont Uloric

## 2011-04-07 NOTE — Telephone Encounter (Signed)
Per MD: Continue same Coumadin 3 mg daily, except 2 mg M-W-F. Recheck 12/7.

## 2011-04-07 NOTE — Assessment & Plan Note (Signed)
2012-remission Off steroids

## 2011-04-07 NOTE — Assessment & Plan Note (Signed)
Increase Coreg

## 2011-04-07 NOTE — Assessment & Plan Note (Signed)
better 

## 2011-04-07 NOTE — Assessment & Plan Note (Signed)
Better Continue with current prescription therapy as reflected on the Med list.  

## 2011-04-08 ENCOUNTER — Encounter: Payer: Self-pay | Admitting: Internal Medicine

## 2011-04-13 ENCOUNTER — Other Ambulatory Visit: Payer: Medicare Other | Admitting: Lab

## 2011-04-17 ENCOUNTER — Other Ambulatory Visit: Payer: Self-pay | Admitting: Internal Medicine

## 2011-04-21 ENCOUNTER — Encounter (INDEPENDENT_AMBULATORY_CARE_PROVIDER_SITE_OTHER): Payer: Self-pay | Admitting: General Surgery

## 2011-04-30 ENCOUNTER — Other Ambulatory Visit: Payer: Self-pay | Admitting: *Deleted

## 2011-04-30 DIAGNOSIS — I82409 Acute embolism and thrombosis of unspecified deep veins of unspecified lower extremity: Secondary | ICD-10-CM

## 2011-05-01 ENCOUNTER — Telehealth: Payer: Self-pay | Admitting: *Deleted

## 2011-05-01 ENCOUNTER — Other Ambulatory Visit (HOSPITAL_BASED_OUTPATIENT_CLINIC_OR_DEPARTMENT_OTHER): Payer: Medicare Other | Admitting: Lab

## 2011-05-01 DIAGNOSIS — I80299 Phlebitis and thrombophlebitis of other deep vessels of unspecified lower extremity: Secondary | ICD-10-CM

## 2011-05-01 DIAGNOSIS — I82409 Acute embolism and thrombosis of unspecified deep veins of unspecified lower extremity: Secondary | ICD-10-CM

## 2011-05-01 NOTE — Telephone Encounter (Signed)
Left message for pt to call office. Continue Coumadin 3 mg daily except 2 mg MWF. Order sent to schedulers for one month lab appt.

## 2011-05-12 ENCOUNTER — Telehealth: Payer: Self-pay | Admitting: Oncology

## 2011-05-12 ENCOUNTER — Other Ambulatory Visit: Payer: Self-pay | Admitting: *Deleted

## 2011-05-12 DIAGNOSIS — I82409 Acute embolism and thrombosis of unspecified deep veins of unspecified lower extremity: Secondary | ICD-10-CM

## 2011-05-12 NOTE — Telephone Encounter (Signed)
S/w the pt's husband regarding the lab appt on 06/01/2011 qnd to pick up an appt calendar at that time

## 2011-06-01 ENCOUNTER — Other Ambulatory Visit (HOSPITAL_BASED_OUTPATIENT_CLINIC_OR_DEPARTMENT_OTHER): Payer: Medicare Other | Admitting: Lab

## 2011-06-01 DIAGNOSIS — I82409 Acute embolism and thrombosis of unspecified deep veins of unspecified lower extremity: Secondary | ICD-10-CM

## 2011-06-01 LAB — PROTIME-INR
INR: 3.4 (ref 2.00–3.50)
Protime: 40.8 Seconds — ABNORMAL HIGH (ref 10.6–13.4)

## 2011-06-05 ENCOUNTER — Other Ambulatory Visit: Payer: Self-pay | Admitting: *Deleted

## 2011-06-05 ENCOUNTER — Telehealth: Payer: Self-pay | Admitting: *Deleted

## 2011-06-05 DIAGNOSIS — I82409 Acute embolism and thrombosis of unspecified deep veins of unspecified lower extremity: Secondary | ICD-10-CM

## 2011-06-05 NOTE — Telephone Encounter (Signed)
Left message on voicemail for pt to change Coumadin dose to 3mg  on M+TH. 2mg  other days.

## 2011-06-05 NOTE — Telephone Encounter (Signed)
Message copied by Caleb Popp on Fri Jun 05, 2011  2:56 PM ------      Message from: Thornton Papas B      Created: Thu Jun 04, 2011  9:38 PM       Please call patient, change coumadin to 3mg  on M and Thur. Only, 2 mg other days, check PT in 2 weeks

## 2011-06-10 ENCOUNTER — Other Ambulatory Visit (INDEPENDENT_AMBULATORY_CARE_PROVIDER_SITE_OTHER): Payer: Medicare Other

## 2011-06-10 DIAGNOSIS — M109 Gout, unspecified: Secondary | ICD-10-CM

## 2011-06-10 DIAGNOSIS — I10 Essential (primary) hypertension: Secondary | ICD-10-CM

## 2011-06-10 DIAGNOSIS — E119 Type 2 diabetes mellitus without complications: Secondary | ICD-10-CM

## 2011-06-10 LAB — HEPATIC FUNCTION PANEL
AST: 22 U/L (ref 0–37)
Alkaline Phosphatase: 74 U/L (ref 39–117)
Bilirubin, Direct: 0.1 mg/dL (ref 0.0–0.3)

## 2011-06-10 LAB — BASIC METABOLIC PANEL
Calcium: 9.1 mg/dL (ref 8.4–10.5)
GFR: 33.56 mL/min — ABNORMAL LOW (ref >60.00)
Potassium: 5.6 mEq/L — ABNORMAL HIGH (ref 3.5–5.1)
Sodium: 141 mEq/L (ref 135–145)

## 2011-06-10 LAB — HEMOGLOBIN A1C: Hgb A1c MFr Bld: 6.1 % (ref 4.6–6.5)

## 2011-06-17 ENCOUNTER — Encounter: Payer: Self-pay | Admitting: Internal Medicine

## 2011-06-17 ENCOUNTER — Ambulatory Visit (INDEPENDENT_AMBULATORY_CARE_PROVIDER_SITE_OTHER): Payer: Medicare Other | Admitting: Internal Medicine

## 2011-06-17 VITALS — BP 130/80 | HR 72 | Temp 97.0°F | Resp 16 | Wt 234.0 lb

## 2011-06-17 DIAGNOSIS — I10 Essential (primary) hypertension: Secondary | ICD-10-CM

## 2011-06-17 DIAGNOSIS — E119 Type 2 diabetes mellitus without complications: Secondary | ICD-10-CM

## 2011-06-17 DIAGNOSIS — M353 Polymyalgia rheumatica: Secondary | ICD-10-CM

## 2011-06-17 DIAGNOSIS — E875 Hyperkalemia: Secondary | ICD-10-CM

## 2011-06-17 DIAGNOSIS — R51 Headache: Secondary | ICD-10-CM

## 2011-06-17 NOTE — Assessment & Plan Note (Signed)
Better Continue with current prescription therapy as reflected on the Med list.  

## 2011-06-17 NOTE — Assessment & Plan Note (Signed)
1/13 poss med and CRI related - will re-check in 1-2 wks

## 2011-06-17 NOTE — Assessment & Plan Note (Addendum)
Rare now 

## 2011-06-17 NOTE — Assessment & Plan Note (Signed)
Better  

## 2011-06-17 NOTE — Progress Notes (Signed)
Subjective:    [name], female    DOB: 1935/07/09, 76 y.o.   MRN: [id number]  HPI  The patient presents for a follow-up of  chronic hypertension, chronic dyslipidemia, type 2 diabetes controlled with medicines   Wt Readings from Last 3 Encounters:  06/17/11 234 lb (106.142 kg)  04/07/11 233 lb (105.688 kg)  02/03/11 235 lb (106.595 kg)     Review of Systems  Constitutional: Negative for chills, activity change, appetite change, fatigue and unexpected weight change.  HENT: Negative for congestion, mouth sores and sinus pressure.   Eyes: Negative for visual disturbance.  Respiratory: Negative for cough and chest tightness.   Gastrointestinal: Negative for nausea and abdominal pain.  Genitourinary: Negative for frequency, difficulty urinating and vaginal pain.  Musculoskeletal: Positive for back pain, arthralgias and gait problem. Negative for joint swelling.  Skin: Negative for pallor and rash.  Neurological: Negative for dizziness, tremors, weakness, numbness and headaches.  Psychiatric/Behavioral: Negative for suicidal ideas, confusion and sleep disturbance.   BP Readings from Last 3 Encounters:  06/17/11 130/80  04/07/11 154/72  02/03/11 160/90       Objective:   Physical Exam  Constitutional: She appears well-developed. No distress.  HENT:  Head: Normocephalic.  Right Ear: External ear normal.  Left Ear: External ear normal.  Nose: Nose normal.  Mouth/Throat: Oropharynx is clear and moist.  Eyes: Conjunctivae are normal. Pupils are equal, round, and reactive to light. Right eye exhibits no discharge. Left eye exhibits no discharge.  Neck: Normal range of motion. Neck supple. No JVD present. No tracheal deviation present. No thyromegaly present.  Cardiovascular: Normal rate, regular rhythm and normal heart sounds.   Pulmonary/Chest: No stridor. No respiratory distress. She has no wheezes.  Abdominal: Soft. Bowel sounds are normal. She exhibits  no distension and no mass. There is no tenderness. There is no rebound and no guarding.  Musculoskeletal: She exhibits no edema and no tenderness.  Lymphadenopathy:    She has no cervical adenopathy.  Neurological: She displays normal reflexes. No cranial nerve deficit. She exhibits normal muscle tone. Coordination normal.       cane  Skin: No rash noted. No erythema.  Psychiatric: She has a normal mood and affect. Her behavior is normal. Judgment and thought content normal.    Lab Results  Component Value Date   WBC 6.1 03/06/2011   HGB 11.4* 03/06/2011   HCT 34.9 03/06/2011   PLT 240 03/06/2011   GLUCOSE 100* 06/10/2011   CHOL 234* 11/20/2009   TRIG 238.0* 11/20/2009   HDL 37.70* 11/20/2009   LDLDIRECT 152.8 11/20/2009   LDLCALC  Value: 154        Total Cholesterol/HDL:CHD Risk Coronary Heart Disease Risk Table                     Men   Women  1/2 Average Risk   3.4   3.3  Average Risk       5.0   4.4  2 X Average Risk   9.6   7.1  3 X Average Risk  23.4   11.0        Use the calculated Patient Ratio above and the CHD Risk Table to determine the patient's CHD Risk.        ATP III CLASSIFICATION (LDL):  <100     mg/dL   Optimal  782-956  mg/dL   Near or Above  Optimal  130-159  mg/dL   Borderline  161-096  mg/dL   High  >045     mg/dL   Very High* 08/31/8117   ALT 16 06/10/2011   AST 22 06/10/2011   NA 141 06/10/2011   K 5.6* 06/10/2011   CL 110 06/10/2011   CREATININE 1.6* 06/10/2011   BUN 28* 06/10/2011   CO2 23 06/10/2011   TSH 2.48 05/29/2010   INR 3.40 06/01/2011   HGBA1C 6.1 06/10/2011   MICROALBUR 3.1* 09/27/2006          Assessment & Plan:

## 2011-06-17 NOTE — Assessment & Plan Note (Signed)
Monitoring

## 2011-06-19 ENCOUNTER — Telehealth: Payer: Self-pay | Admitting: Oncology

## 2011-06-19 ENCOUNTER — Other Ambulatory Visit: Payer: Medicare Other | Admitting: Lab

## 2011-06-19 NOTE — Telephone Encounter (Signed)
pt called and r/s appt for 01/25 to 01/29

## 2011-06-23 ENCOUNTER — Other Ambulatory Visit (HOSPITAL_BASED_OUTPATIENT_CLINIC_OR_DEPARTMENT_OTHER): Payer: Medicare Other | Admitting: Lab

## 2011-06-23 ENCOUNTER — Other Ambulatory Visit: Payer: Self-pay | Admitting: Oncology

## 2011-06-23 DIAGNOSIS — I82409 Acute embolism and thrombosis of unspecified deep veins of unspecified lower extremity: Secondary | ICD-10-CM

## 2011-06-23 DIAGNOSIS — C50919 Malignant neoplasm of unspecified site of unspecified female breast: Secondary | ICD-10-CM

## 2011-06-23 LAB — PROTIME-INR: Protime: 25.2 Seconds — ABNORMAL HIGH (ref 10.6–13.4)

## 2011-06-24 ENCOUNTER — Telehealth: Payer: Self-pay

## 2011-06-24 NOTE — Telephone Encounter (Signed)
Pt notified by phone per Dr Truett Perna - continue same dose of Coumadin.  Pt confirms she takes Coumadin 3mg  on M & Th, 2mg  all other days.  Pt has recheck appts scheduled. dph

## 2011-06-26 DIAGNOSIS — I214 Non-ST elevation (NSTEMI) myocardial infarction: Secondary | ICD-10-CM

## 2011-06-26 HISTORY — DX: Non-ST elevation (NSTEMI) myocardial infarction: I21.4

## 2011-06-29 ENCOUNTER — Other Ambulatory Visit (HOSPITAL_BASED_OUTPATIENT_CLINIC_OR_DEPARTMENT_OTHER): Payer: Medicare Other | Admitting: Lab

## 2011-06-29 DIAGNOSIS — Z7901 Long term (current) use of anticoagulants: Secondary | ICD-10-CM

## 2011-07-02 ENCOUNTER — Telehealth: Payer: Self-pay | Admitting: *Deleted

## 2011-07-02 NOTE — Telephone Encounter (Signed)
Called pt with INR results from 06/29/11. Continue same dose of Coumadin, per Dr. Truett Perna. Check PT INR in one month.

## 2011-07-13 ENCOUNTER — Encounter (HOSPITAL_COMMUNITY): Payer: Self-pay | Admitting: Emergency Medicine

## 2011-07-13 ENCOUNTER — Emergency Department (HOSPITAL_COMMUNITY): Payer: Medicare Other

## 2011-07-13 ENCOUNTER — Inpatient Hospital Stay (HOSPITAL_COMMUNITY)
Admission: EM | Admit: 2011-07-13 | Discharge: 2011-07-21 | DRG: 280 | Disposition: A | Discharge status: Home / Self Care | Payer: Medicare Other | Attending: Internal Medicine | Admitting: Internal Medicine

## 2011-07-13 ENCOUNTER — Other Ambulatory Visit: Payer: Self-pay

## 2011-07-13 DIAGNOSIS — D649 Anemia, unspecified: Secondary | ICD-10-CM

## 2011-07-13 DIAGNOSIS — Z9989 Dependence on other enabling machines and devices: Secondary | ICD-10-CM | POA: Insufficient documentation

## 2011-07-13 DIAGNOSIS — F329 Major depressive disorder, single episode, unspecified: Secondary | ICD-10-CM | POA: Diagnosis present

## 2011-07-13 DIAGNOSIS — F411 Generalized anxiety disorder: Secondary | ICD-10-CM | POA: Diagnosis present

## 2011-07-13 DIAGNOSIS — J811 Chronic pulmonary edema: Secondary | ICD-10-CM

## 2011-07-13 DIAGNOSIS — I129 Hypertensive chronic kidney disease with stage 1 through stage 4 chronic kidney disease, or unspecified chronic kidney disease: Secondary | ICD-10-CM | POA: Diagnosis present

## 2011-07-13 DIAGNOSIS — M21619 Bunion of unspecified foot: Secondary | ICD-10-CM

## 2011-07-13 DIAGNOSIS — I429 Cardiomyopathy, unspecified: Secondary | ICD-10-CM

## 2011-07-13 DIAGNOSIS — I5021 Acute systolic (congestive) heart failure: Secondary | ICD-10-CM

## 2011-07-13 DIAGNOSIS — I509 Heart failure, unspecified: Secondary | ICD-10-CM | POA: Diagnosis present

## 2011-07-13 DIAGNOSIS — M545 Low back pain: Secondary | ICD-10-CM | POA: Diagnosis present

## 2011-07-13 DIAGNOSIS — M1990 Unspecified osteoarthritis, unspecified site: Secondary | ICD-10-CM

## 2011-07-13 DIAGNOSIS — I359 Nonrheumatic aortic valve disorder, unspecified: Secondary | ICD-10-CM

## 2011-07-13 DIAGNOSIS — E785 Hyperlipidemia, unspecified: Secondary | ICD-10-CM

## 2011-07-13 DIAGNOSIS — I214 Non-ST elevation (NSTEMI) myocardial infarction: Principal | ICD-10-CM | POA: Diagnosis present

## 2011-07-13 DIAGNOSIS — R0602 Shortness of breath: Secondary | ICD-10-CM

## 2011-07-13 DIAGNOSIS — E871 Hypo-osmolality and hyponatremia: Secondary | ICD-10-CM | POA: Diagnosis not present

## 2011-07-13 DIAGNOSIS — J9601 Acute respiratory failure with hypoxia: Secondary | ICD-10-CM

## 2011-07-13 DIAGNOSIS — I10 Essential (primary) hypertension: Secondary | ICD-10-CM

## 2011-07-13 DIAGNOSIS — Z8639 Personal history of other endocrine, nutritional and metabolic disease: Secondary | ICD-10-CM

## 2011-07-13 DIAGNOSIS — J449 Chronic obstructive pulmonary disease, unspecified: Secondary | ICD-10-CM | POA: Diagnosis present

## 2011-07-13 DIAGNOSIS — E875 Hyperkalemia: Secondary | ICD-10-CM

## 2011-07-13 DIAGNOSIS — N289 Disorder of kidney and ureter, unspecified: Secondary | ICD-10-CM

## 2011-07-13 DIAGNOSIS — N390 Urinary tract infection, site not specified: Secondary | ICD-10-CM

## 2011-07-13 DIAGNOSIS — G4733 Obstructive sleep apnea (adult) (pediatric): Secondary | ICD-10-CM | POA: Insufficient documentation

## 2011-07-13 DIAGNOSIS — R600 Localized edema: Secondary | ICD-10-CM

## 2011-07-13 DIAGNOSIS — I639 Cerebral infarction, unspecified: Secondary | ICD-10-CM

## 2011-07-13 DIAGNOSIS — Z66 Do not resuscitate: Secondary | ICD-10-CM | POA: Diagnosis present

## 2011-07-13 DIAGNOSIS — D72829 Elevated white blood cell count, unspecified: Secondary | ICD-10-CM

## 2011-07-13 DIAGNOSIS — I82409 Acute embolism and thrombosis of unspecified deep veins of unspecified lower extremity: Secondary | ICD-10-CM

## 2011-07-13 DIAGNOSIS — M109 Gout, unspecified: Secondary | ICD-10-CM | POA: Diagnosis present

## 2011-07-13 DIAGNOSIS — G4710 Hypersomnia, unspecified: Secondary | ICD-10-CM

## 2011-07-13 DIAGNOSIS — E119 Type 2 diabetes mellitus without complications: Secondary | ICD-10-CM

## 2011-07-13 DIAGNOSIS — K120 Recurrent oral aphthae: Secondary | ICD-10-CM

## 2011-07-13 DIAGNOSIS — I5043 Acute on chronic combined systolic (congestive) and diastolic (congestive) heart failure: Secondary | ICD-10-CM | POA: Diagnosis present

## 2011-07-13 DIAGNOSIS — C50919 Malignant neoplasm of unspecified site of unspecified female breast: Secondary | ICD-10-CM | POA: Diagnosis present

## 2011-07-13 DIAGNOSIS — I480 Paroxysmal atrial fibrillation: Secondary | ICD-10-CM | POA: Diagnosis not present

## 2011-07-13 DIAGNOSIS — N184 Chronic kidney disease, stage 4 (severe): Secondary | ICD-10-CM

## 2011-07-13 DIAGNOSIS — J9600 Acute respiratory failure, unspecified whether with hypoxia or hypercapnia: Secondary | ICD-10-CM | POA: Diagnosis present

## 2011-07-13 DIAGNOSIS — J4489 Other specified chronic obstructive pulmonary disease: Secondary | ICD-10-CM | POA: Diagnosis present

## 2011-07-13 DIAGNOSIS — R51 Headache: Secondary | ICD-10-CM

## 2011-07-13 DIAGNOSIS — Z853 Personal history of malignant neoplasm of breast: Secondary | ICD-10-CM

## 2011-07-13 DIAGNOSIS — I4891 Unspecified atrial fibrillation: Secondary | ICD-10-CM

## 2011-07-13 DIAGNOSIS — Z86718 Personal history of other venous thrombosis and embolism: Secondary | ICD-10-CM | POA: Diagnosis present

## 2011-07-13 DIAGNOSIS — Z7901 Long term (current) use of anticoagulants: Secondary | ICD-10-CM

## 2011-07-13 DIAGNOSIS — Z862 Personal history of diseases of the blood and blood-forming organs and certain disorders involving the immune mechanism: Secondary | ICD-10-CM

## 2011-07-13 DIAGNOSIS — J45902 Unspecified asthma with status asthmaticus: Secondary | ICD-10-CM

## 2011-07-13 DIAGNOSIS — Z6837 Body mass index (BMI) 37.0-37.9, adult: Secondary | ICD-10-CM

## 2011-07-13 DIAGNOSIS — J810 Acute pulmonary edema: Secondary | ICD-10-CM

## 2011-07-13 DIAGNOSIS — Z87891 Personal history of nicotine dependence: Secondary | ICD-10-CM

## 2011-07-13 DIAGNOSIS — Z23 Encounter for immunization: Secondary | ICD-10-CM

## 2011-07-13 DIAGNOSIS — Z8673 Personal history of transient ischemic attack (TIA), and cerebral infarction without residual deficits: Secondary | ICD-10-CM

## 2011-07-13 DIAGNOSIS — I428 Other cardiomyopathies: Secondary | ICD-10-CM | POA: Diagnosis present

## 2011-07-13 DIAGNOSIS — M353 Polymyalgia rheumatica: Secondary | ICD-10-CM

## 2011-07-13 DIAGNOSIS — I82509 Chronic embolism and thrombosis of unspecified deep veins of unspecified lower extremity: Secondary | ICD-10-CM | POA: Diagnosis present

## 2011-07-13 DIAGNOSIS — E669 Obesity, unspecified: Secondary | ICD-10-CM | POA: Diagnosis present

## 2011-07-13 HISTORY — DX: Cerebral infarction, unspecified: I63.9

## 2011-07-13 LAB — POCT I-STAT, CHEM 8
Chloride: 111 mEq/L (ref 96–112)
Creatinine, Ser: 1.8 mg/dL — ABNORMAL HIGH (ref 0.50–1.10)
HCT: 39 % (ref 36.0–46.0)
Hemoglobin: 13.3 g/dL (ref 12.0–15.0)
Potassium: 5.1 mEq/L (ref 3.5–5.1)
Sodium: 137 mEq/L (ref 135–145)

## 2011-07-13 LAB — BASIC METABOLIC PANEL
CO2: 18 mEq/L — ABNORMAL LOW (ref 19–32)
Calcium: 9.5 mg/dL (ref 8.4–10.5)
Creatinine, Ser: 1.76 mg/dL — ABNORMAL HIGH (ref 0.50–1.10)
Glucose, Bld: 101 mg/dL — ABNORMAL HIGH (ref 70–99)

## 2011-07-13 LAB — URINALYSIS, ROUTINE W REFLEX MICROSCOPIC
Nitrite: POSITIVE — AB
Specific Gravity, Urine: 1.009 (ref 1.005–1.030)
Urobilinogen, UA: 0.2 mg/dL (ref 0.0–1.0)
pH: 5.5 (ref 5.0–8.0)

## 2011-07-13 LAB — GLUCOSE, CAPILLARY
Glucose-Capillary: 111 mg/dL — ABNORMAL HIGH (ref 70–99)
Glucose-Capillary: 94 mg/dL (ref 70–99)
Glucose-Capillary: 123 mg/dL — ABNORMAL HIGH (ref 70–99)

## 2011-07-13 LAB — PROCALCITONIN: Procalcitonin: 0.38 ng/mL

## 2011-07-13 LAB — DIFFERENTIAL
Basophils Absolute: 0.1 10*3/uL (ref 0.0–0.1)
Basophils Absolute: 0 10*3/uL (ref 0.0–0.1)
Eosinophils Relative: 1 % (ref 0–5)
Lymphocytes Relative: 6 % — ABNORMAL LOW (ref 12–46)
Lymphs Abs: 0.7 10*3/uL (ref 0.7–4.0)
Lymphs Abs: 0.9 10*3/uL (ref 0.7–4.0)
Monocytes Absolute: 0.5 10*3/uL (ref 0.1–1.0)
Monocytes Absolute: 0.7 10*3/uL (ref 0.1–1.0)
Neutro Abs: 10.1 10*3/uL — ABNORMAL HIGH (ref 1.7–7.7)

## 2011-07-13 LAB — CBC
HCT: 36.3 % (ref 36.0–46.0)
Hemoglobin: 11.9 g/dL — ABNORMAL LOW (ref 12.0–15.0)
MCH: 28.1 pg (ref 26.0–34.0)
MCV: 86.2 fL (ref 78.0–100.0)
MCV: 86.3 fL (ref 78.0–100.0)
Platelets: 270 10*3/uL (ref 150–400)
RBC: 4.21 MIL/uL (ref 3.87–5.11)
RDW: 13.8 % (ref 11.5–15.5)
RDW: 13.8 % (ref 11.5–15.5)
WBC: 11.7 10*3/uL — ABNORMAL HIGH (ref 4.0–10.5)

## 2011-07-13 LAB — CARDIAC PANEL(CRET KIN+CKTOT+MB+TROPI)
CK, MB: 7.1 ng/mL (ref 0.3–4.0)
Relative Index: INVALID (ref 0.0–2.5)
Relative Index: INVALID (ref 0.0–2.5)
Relative Index: INVALID (ref 0.0–2.5)
Troponin I: 1.3 ng/mL (ref <0.30)

## 2011-07-13 LAB — POCT I-STAT 3, VENOUS BLOOD GAS (G3P V)
pCO2, Ven: 35.1 mmHg — ABNORMAL LOW (ref 45.0–50.0)
pO2, Ven: 63 mmHg — ABNORMAL HIGH (ref 30.0–45.0)

## 2011-07-13 LAB — URINE MICROSCOPIC-ADD ON

## 2011-07-13 LAB — PROTIME-INR
INR: 1.44 (ref 0.00–1.49)
Prothrombin Time: 17.8 seconds — ABNORMAL HIGH (ref 11.6–15.2)

## 2011-07-13 MED ORDER — DEXTROSE 5 % IV SOLN
1.0000 g | Freq: Once | INTRAVENOUS | Status: AC
  Administered 2011-07-13: 1 g via INTRAVENOUS
  Filled 2011-07-13: qty 10

## 2011-07-13 MED ORDER — SODIUM CHLORIDE 0.9 % IJ SOLN
3.0000 mL | INTRAMUSCULAR | Status: DC | PRN
  Administered 2011-07-19: 3 mL via INTRAVENOUS

## 2011-07-13 MED ORDER — PREDNISOLONE ACETATE 1 % OP SUSP
1.0000 [drp] | Freq: Every day | OPHTHALMIC | Status: DC | PRN
  Administered 2011-07-14: 1 [drp] via OPHTHALMIC
  Filled 2011-07-13: qty 1

## 2011-07-13 MED ORDER — SODIUM CHLORIDE 0.9 % IJ SOLN
3.0000 mL | Freq: Two times a day (BID) | INTRAMUSCULAR | Status: DC
  Administered 2011-07-13 – 2011-07-20 (×12): 3 mL via INTRAVENOUS

## 2011-07-13 MED ORDER — ONDANSETRON HCL 4 MG/2ML IJ SOLN: 4.0000 mg | Freq: Four times a day (QID) | INTRAMUSCULAR | Status: DC | PRN

## 2011-07-13 MED ORDER — BRIMONIDINE TARTRATE 0.15 % OP SOLN: 1.0000 [drp] | Freq: Every day | OPHTHALMIC | Status: DC

## 2011-07-13 MED ORDER — SODIUM CHLORIDE 0.9 % IV SOLN
250.0000 mL | INTRAVENOUS | Status: DC | PRN
  Administered 2011-07-16: 10 mL via INTRAVENOUS

## 2011-07-13 MED ORDER — PANTOPRAZOLE SODIUM 40 MG PO TBEC
40.0000 mg | DELAYED_RELEASE_TABLET | Freq: Every day | ORAL | Status: DC
  Administered 2011-07-13 – 2011-07-20 (×8): 40 mg via ORAL
  Filled 2011-07-13 (×7): qty 1

## 2011-07-13 MED ORDER — FUROSEMIDE 10 MG/ML IJ SOLN
INTRAMUSCULAR | Status: AC
  Filled 2011-07-13: qty 2

## 2011-07-13 MED ORDER — BRIMONIDINE TARTRATE 0.2 % OP SOLN
1.0000 [drp] | Freq: Every day | OPHTHALMIC | Status: DC
  Administered 2011-07-13 – 2011-07-15 (×3): 1 [drp] via OPHTHALMIC
  Filled 2011-07-13: qty 5

## 2011-07-13 MED ORDER — CLORAZEPATE DIPOTASSIUM 7.5 MG PO TABS
7.5000 mg | ORAL_TABLET | Freq: Two times a day (BID) | ORAL | Status: DC | PRN
  Administered 2011-07-14 – 2011-07-20 (×9): 7.5 mg via ORAL
  Filled 2011-07-13 (×10): qty 1

## 2011-07-13 MED ORDER — INSULIN ASPART 100 UNIT/ML ~~LOC~~ SOLN
0.0000 [IU] | Freq: Three times a day (TID) | SUBCUTANEOUS | Status: DC
  Administered 2011-07-13 – 2011-07-14 (×2): 1 [IU] via SUBCUTANEOUS
  Administered 2011-07-14: 2 [IU] via SUBCUTANEOUS
  Administered 2011-07-15 – 2011-07-19 (×8): 1 [IU] via SUBCUTANEOUS
  Filled 2011-07-13: qty 3

## 2011-07-13 MED ORDER — ENOXAPARIN SODIUM 100 MG/ML ~~LOC~~ SOLN
100.0000 mg | Freq: Two times a day (BID) | SUBCUTANEOUS | Status: DC
  Administered 2011-07-13 – 2011-07-14 (×4): 100 mg via SUBCUTANEOUS
  Filled 2011-07-13 (×9): qty 1

## 2011-07-13 MED ORDER — ZOLPIDEM TARTRATE 5 MG PO TABS: 5.0000 mg | ORAL_TABLET | Freq: Every evening | ORAL | Status: DC | PRN

## 2011-07-13 MED ORDER — ACETAMINOPHEN 325 MG PO TABS
650.0000 mg | ORAL_TABLET | ORAL | Status: DC | PRN
  Administered 2011-07-13 – 2011-07-21 (×3): 650 mg via ORAL
  Filled 2011-07-13: qty 2

## 2011-07-13 MED ORDER — PNEUMOCOCCAL VAC POLYVALENT 25 MCG/0.5ML IJ INJ
0.5000 mL | INJECTION | INTRAMUSCULAR | Status: AC
  Filled 2011-07-13: qty 0.5

## 2011-07-13 MED ORDER — VITAMIN D 400 UNITS PO TABS
400.0000 [IU] | ORAL_TABLET | Freq: Every day | ORAL | Status: DC
  Administered 2011-07-13 – 2011-07-21 (×9): 400 [IU] via ORAL
  Filled 2011-07-13 (×10): qty 1

## 2011-07-13 MED ORDER — FUROSEMIDE 10 MG/ML IJ SOLN
INTRAMUSCULAR | Status: AC
  Filled 2011-07-13: qty 4

## 2011-07-13 MED ORDER — FUROSEMIDE 10 MG/ML IJ SOLN: 40.0000 mg | Freq: Two times a day (BID) | INTRAMUSCULAR | Status: DC

## 2011-07-13 MED ORDER — METOLAZONE 2.5 MG PO TABS
2.5000 mg | ORAL_TABLET | Freq: Two times a day (BID) | ORAL | Status: DC
  Administered 2011-07-13 – 2011-07-14 (×3): 2.5 mg via ORAL
  Filled 2011-07-13 (×4): qty 1

## 2011-07-13 MED ORDER — ANASTROZOLE 1 MG PO TABS
1.0000 mg | ORAL_TABLET | Freq: Every day | ORAL | Status: DC
  Administered 2011-07-13 – 2011-07-21 (×9): 1 mg via ORAL
  Filled 2011-07-13 (×9): qty 1

## 2011-07-13 MED ORDER — CARVEDILOL 3.125 MG PO TABS
3.1250 mg | ORAL_TABLET | Freq: Two times a day (BID) | ORAL | Status: DC
  Administered 2011-07-13 – 2011-07-14 (×4): 3.125 mg via ORAL
  Filled 2011-07-13 (×7): qty 1

## 2011-07-13 MED ORDER — FUROSEMIDE 10 MG/ML IJ SOLN
40.0000 mg | Freq: Two times a day (BID) | INTRAMUSCULAR | Status: DC
  Administered 2011-07-13 – 2011-07-14 (×2): 40 mg via INTRAVENOUS
  Filled 2011-07-13 (×3): qty 4

## 2011-07-13 MED ORDER — LEVOFLOXACIN IN D5W 750 MG/150ML IV SOLN
750.0000 mg | INTRAVENOUS | Status: DC
  Administered 2011-07-13: 750 mg via INTRAVENOUS
  Filled 2011-07-13 (×2): qty 150

## 2011-07-13 MED ORDER — TIMOLOL MALEATE 0.5 % OP SOLN
1.0000 [drp] | Freq: Two times a day (BID) | OPHTHALMIC | Status: DC
  Administered 2011-07-13 – 2011-07-21 (×17): 1 [drp] via OPHTHALMIC
  Filled 2011-07-13 (×2): qty 5

## 2011-07-13 MED ORDER — POTASSIUM CHLORIDE CRYS ER 20 MEQ PO TBCR
20.0000 meq | EXTENDED_RELEASE_TABLET | Freq: Once | ORAL | Status: AC
  Administered 2011-07-13: 20 meq via ORAL
  Filled 2011-07-13: qty 1

## 2011-07-13 MED ORDER — INSULIN ASPART 100 UNIT/ML ~~LOC~~ SOLN: 0.0000 [IU] | Freq: Every day | SUBCUTANEOUS | Status: DC

## 2011-07-13 MED ORDER — LEVOTHYROXINE SODIUM 137 MCG PO TABS
137.0000 ug | ORAL_TABLET | Freq: Every day | ORAL | Status: DC
  Administered 2011-07-13 – 2011-07-21 (×9): 137 ug via ORAL
  Filled 2011-07-13 (×9): qty 1

## 2011-07-13 MED ORDER — ASPIRIN 81 MG PO CHEW
81.0000 mg | CHEWABLE_TABLET | Freq: Every day | ORAL | Status: DC
  Administered 2011-07-13 – 2011-07-20 (×8): 81 mg via ORAL
  Filled 2011-07-13 (×6): qty 1

## 2011-07-13 MED ORDER — WARFARIN SODIUM 6 MG PO TABS
6.0000 mg | ORAL_TABLET | Freq: Once | ORAL | Status: AC
  Administered 2011-07-13: 6 mg via ORAL
  Filled 2011-07-13: qty 1

## 2011-07-13 NOTE — ED Notes (Signed)
Patient being transported upstairs on portable cardiac monitor with RN. 

## 2011-07-13 NOTE — ED Notes (Signed)
Patient from home; patient states that while she was getting ready for bed tonight, she became really short of breath (patient normally sleeps with a CPAP machine at night, but states that she has never had a respiratory distress episode such at this); called EMS.  Upon EMS arrival, patient had shortness of breath and was very diaphoretic.  Was given 4 mg Morphine and 52 mg of Lasix by EMS and placed on CPAP machine.  Upon arrival to room, patient changed into gown and connected to continuous cardiac, pulse ox, and blood pressure monitor.  Dr. Read Drivers and respiratory at bedside.  Respiratory tech has switched patient from EMS CPAP to BiPAP machine.  Patient states that the CPAP/BiPAP machine has made her feel better.  Will continue to monitor.

## 2011-07-13 NOTE — ED Notes (Signed)
Patient currently sitting up in bed; no respiratory or acute distress noted.  Patient states that she feels much better; patient still on BiPAP.  Family present at bedside.  Pharmacy tech at bedside asking patient about home medications.  Will continue to monitor.

## 2011-07-13 NOTE — Progress Notes (Signed)
Pt taken off BiPAP. MD aware and pt stable at this point. No distress noted.

## 2011-07-13 NOTE — ED Notes (Signed)
Patient has been removed from BiPAP by respiratory; placed on 2 liters O2 [place] -- O2 98%; patient tolerating well.

## 2011-07-13 NOTE — ED Notes (Signed)
Patient currently sitting up in bed; no respiratory or acute distress noted.  Patient updated on plan of care; informed patient that the EDP has made a consult to the hospitalist.  Patient complaining of the BiPAP mask not fitting correctly; readjusted for patient and called respiratory.  Patient has no other questions or concerns at this time; will continue to monitor.

## 2011-07-13 NOTE — H&P (Signed)
DATE OF ADMISSION:  07/13/2011  PCP:    Sonda Primes, MD, MD   Chief Complaint: SOB   HPI: Wendy Cole is an 76 y.o. female presents with complaints of sudden onset of SOB at about midnight. She was about to get ready to go to sleep and she was not able to get her breath.  She reports feeling heaviness in her chest. Her CPAP did not relieve her SOB, and EMS was called.  EMS gave her IV Lasix on route.  In the ED she was evaluated and her chest X-Ray revealed Pulmonary edema, and a Pro-BNP was 4000.   She has atrial fibrillation, a previous CVA, and a previous DVT of the LLE and is on chronic coumadin therapy.  She denies any previous history of CHF.  She does report having edema in he fee and legs at times.  She recently has been taking increased naproxen for her gout pain.    Past Medical History  Diagnosis Date  . OSA on CPAP   . Anemia   . Anxiety states   . Depressive disorder, not elsewhere classified   . Type II or unspecified type diabetes mellitus without mention of complication, not stated as uncontrolled   . Other and unspecified hyperlipidemia   . Unspecified essential hypertension   . Renal insufficiency   . Osteoarthritis   . Gout   . Iritis   . Stroke     Past Surgical History  Procedure Date  . Mastectomy   . Breast lumpectomy   . Cataract extraction   . Foot surgery   . Mastectomy     Medications:  HOME MEDS: Prior to Admission medications   Medication Sig Start Date End Date Taking? Authorizing Provider  anastrozole (ARIMIDEX) 1 MG tablet Take 1 mg by mouth daily.   Yes Historical Provider, MD  BIOTIN PO Take 1 tablet by mouth daily.   Yes Historical Provider, MD  brimonidine (ALPHAGAN) 0.15 % ophthalmic solution Place 1 drop into both eyes daily.    Yes Historical Provider, MD  buPROPion (WELLBUTRIN SR) 150 MG 12 hr tablet Take 150 mg by mouth daily.   Yes Historical Provider, MD  calcium carbonate (CALCIUM 500) 1250 MG tablet Take 1 tablet by  mouth daily.   Yes Historical Provider, MD  clorazepate (TRANXENE) 7.5 MG tablet Take 7.5 mg by mouth 2 (two) times daily as needed. For anxiety   Yes Historical Provider, MD  enalapril (VASOTEC) 10 MG tablet Take 10 mg by mouth daily.   Yes Historical Provider, MD  Febuxostat 80 MG TABS Take 1 tablet by mouth daily.   Yes Historical Provider, MD  Fluticasone-Salmeterol (ADVAIR) 100-50 MCG/DOSE AEPB Inhale 1 puff into the lungs every 12 (twelve) hours.   Yes Historical Provider, MD  levothyroxine (SYNTHROID, LEVOTHROID) 137 MCG tablet Take 137 mcg by mouth daily.   Yes Historical Provider, MD  naproxen sodium (ANAPROX) 220 MG tablet Take 220 mg by mouth every 12 (twelve) hours as needed. For pain   Yes Historical Provider, MD  omeprazole (PRILOSEC) 40 MG capsule Take 40 mg by mouth daily.    Yes Historical Provider, MD  prednisoLONE acetate (PRED FORTE) 1 % ophthalmic suspension Place 1 drop into both eyes daily as needed. For eye pain   Yes Historical Provider, MD  timolol (TIMOPTIC) 0.5 % ophthalmic solution Place 1 drop into both eyes 2 (two) times daily. Twice daily   Yes Historical Provider, MD  traMADol (ULTRAM) 50 MG tablet Take  50-100 mg by mouth 2 (two) times daily as needed. For pain   Yes Historical Provider, MD  vitamin D, CHOLECALCIFEROL, 400 UNITS tablet Take 400 Units by mouth daily.    Yes Historical Provider, MD  warfarin (COUMADIN) 1 MG tablet Take 2-3 mg by mouth See admin instructions. Takes 3 mg on Mon, and Wed. Takes 2mg   Tues,Thurs, Fri, Sat Sun 03/04/11  Yes Historical Provider, MD    Allergies:  Allergies  Allergen Reactions  . Atorvastatin Other (See Comments)    Muscle aches  . Colesevelam Other (See Comments)    unknown    Social History:   reports that she quit smoking about 9 years ago. Her smoking use included Cigarettes. She has a 80 pack-year smoking history. She has never used smokeless tobacco. She reports that she does not drink alcohol or use illicit  drugs.  Family History: Family History  Problem Relation Age of Onset  . Hypertension    . Heart disease Mother 48    CHF  . Cancer Mother     breast  . Heart disease Father 17  . Heart disease Brother     aaa    Review of Systems: She does report SOB, orthopnea, dyspnea on exertion, peripheral edema, chest pain, muscle weakness.   The patient denies anorexia, fever, weight loss, vision loss, decreased hearing, hoarseness,  syncope, balance deficits, hemoptysis, abdominal pain, melena, hematochezia, severe indigestion/heartburn, hematuria, incontinence, genital sores,s, suspicious skin lesions, transient blindness, difficulty walking, depression, unusual weight change, abnormal bleeding, enlarged lymph nodes, angioedema, and breast masses.   Physical Exam:  GEN:  Pleasant Elderly 76 year old Caucasian female examined  and in no acute distress; cooperative with exam Filed Vitals:   07/13/11 0630 07/13/11 0645 07/13/11 0700 07/13/11 0841  BP: 152/120 122/102 142/63   Pulse: 68 66 65   Temp:    97.7 F (36.5 C)  TempSrc:    Oral  Resp: 20 18 21    Height:      Weight:      SpO2: 98% 98% 98%    Blood pressure 142/63, pulse 65, temperature 97.7 F (36.5 C), temperature source Oral, resp. rate 21, height 5' 4.96" (1.65 m), weight 102.8 kg (226 lb 10.1 oz), SpO2 98.00%. PSYCH: She is alert and oriented x4; does not appear anxious does not appear depressed; affect is normal HEENT: Normocephalic and Atraumatic, Mucous membranes pink; PERRLA; EOM intact; Fundi:  Benign;  No scleral icterus, Nares: Patent, Oropharynx: Clear, Fair Dentition, Neck:  FROM, no cervical lymphadenopathy nor thyromegaly or carotid bruit; no JVD; Breasts:: Not examined CHEST WALL: No tenderness CHEST: Normal respiration,  Decreased Breath Sounds, and bibasilar rales present.   HEART: Regular rate and rhythm; no murmurs rubs or gallops BACK: No kyphosis or scoliosis; no CVA tenderness ABDOMEN: Positive Bowel  Sounds, Obese, soft non-tender; no masses, no organomegaly, +Pannus; no intertriginous candida. Rectal Exam: Not done EXTREMITIES: No cyanosis, clubbing or edema; no ulcerations. Genitalia: not examined PULSES: 2+ and symmetric SKIN: Normal hydration no rash or ulceration CNS: Cranial nerves 2-12 grossly intact no focal neurologic deficit   Labs & Imaging Results for orders placed during the hospital encounter of 07/13/11 (from the past 48 hour(s))  CBC     Status: Abnormal   Collection Time   07/13/11  2:17 AM      Component Value Range Comment   WBC 11.7 (*) 4.0 - 10.5 (K/uL)    RBC 4.21  3.87 - 5.11 (MIL/uL)  Hemoglobin 11.9 (*) 12.0 - 15.0 (g/dL)    HCT 82.9  56.2 - 13.0 (%)    MCV 86.2  78.0 - 100.0 (fL)    MCH 28.3  26.0 - 34.0 (pg)    MCHC 32.8  30.0 - 36.0 (g/dL)    RDW 86.5  78.4 - 69.6 (%)    Platelets 291  150 - 400 (K/uL)   DIFFERENTIAL     Status: Abnormal   Collection Time   07/13/11  2:17 AM      Component Value Range Comment   Neutrophils Relative 87 (*) 43 - 77 (%)    Neutro Abs 10.1 (*) 1.7 - 7.7 (K/uL)    Lymphocytes Relative 6 (*) 12 - 46 (%)    Lymphs Abs 0.7  0.7 - 4.0 (K/uL)    Monocytes Relative 5  3 - 12 (%)    Monocytes Absolute 0.5  0.1 - 1.0 (K/uL)    Eosinophils Relative 3  0 - 5 (%)    Eosinophils Absolute 0.3  0.0 - 0.7 (K/uL)    Basophils Relative 0  0 - 1 (%)    Basophils Absolute 0.1  0.0 - 0.1 (K/uL)   PRO B NATRIURETIC PEPTIDE     Status: Abnormal   Collection Time   07/13/11  2:17 AM      Component Value Range Comment   Pro B Natriuretic peptide (BNP) 4254.0 (*) 0 - 125 (pg/mL)   PROTIME-INR     Status: Abnormal   Collection Time   07/13/11  2:19 AM      Component Value Range Comment   Prothrombin Time 17.8 (*) 11.6 - 15.2 (seconds)    INR 1.44  0.00 - 1.49    POCT I-STAT, CHEM 8     Status: Abnormal   Collection Time   07/13/11  2:28 AM      Component Value Range Comment   Sodium 137  135 - 145 (mEq/L)    Potassium 5.1  3.5 - 5.1  (mEq/L)    Chloride 111  96 - 112 (mEq/L)    BUN 32 (*) 6 - 23 (mg/dL)    Creatinine, Ser 2.95 (*) 0.50 - 1.10 (mg/dL)    Glucose, Bld 284 (*) 70 - 99 (mg/dL)    Calcium, Ion 1.32  1.12 - 1.32 (mmol/L)    TCO2 19  0 - 100 (mmol/L)    Hemoglobin 13.3  12.0 - 15.0 (g/dL)    HCT 44.0  10.2 - 72.5 (%)   POCT I-STAT 3, BLOOD GAS (G3P V)     Status: Abnormal   Collection Time   07/13/11  2:31 AM      Component Value Range Comment   pH, Ven 7.303 (*) 7.250 - 7.300     pCO2, Ven 35.1 (*) 45.0 - 50.0 (mmHg)    pO2, Ven 63.0 (*) 30.0 - 45.0 (mmHg)    Bicarbonate 17.4 (*) 20.0 - 24.0 (mEq/L)    TCO2 18  0 - 100 (mmol/L)    O2 Saturation 90.0      Acid-base deficit 8.0 (*) 0.0 - 2.0 (mmol/L)    Sample type VENOUS     URINALYSIS, ROUTINE W REFLEX MICROSCOPIC     Status: Abnormal   Collection Time   07/13/11  2:34 AM      Component Value Range Comment   Color, Urine YELLOW  YELLOW     APPearance CLOUDY (*) CLEAR     Specific Gravity, Urine 1.009  1.005 -  1.030     pH 5.5  5.0 - 8.0     Glucose, UA NEGATIVE  NEGATIVE (mg/dL)    Hgb urine dipstick TRACE (*) NEGATIVE     Bilirubin Urine NEGATIVE  NEGATIVE     Ketones, ur NEGATIVE  NEGATIVE (mg/dL)    Protein, ur 841 (*) NEGATIVE (mg/dL)    Urobilinogen, UA 0.2  0.0 - 1.0 (mg/dL)    Nitrite POSITIVE (*) NEGATIVE     Leukocytes, UA NEGATIVE  NEGATIVE    URINE MICROSCOPIC-ADD ON     Status: Abnormal   Collection Time   07/13/11  2:34 AM      Component Value Range Comment   WBC, UA 7-10  <3 (WBC/hpf)    RBC / HPF 3-6  <3 (RBC/hpf)    Bacteria, UA MANY (*) RARE     Casts HYALINE CASTS (*) NEGATIVE    MRSA PCR SCREENING     Status: Normal   Collection Time   07/13/11  5:24 AM      Component Value Range Comment   MRSA by PCR NEGATIVE  NEGATIVE    GLUCOSE, CAPILLARY     Status: Abnormal   Collection Time   07/13/11  8:27 AM      Component Value Range Comment   Glucose-Capillary 111 (*) 70 - 99 (mg/dL)    Dg Chest Port 1 View  07/13/2011   *RADIOLOGY REPORT*  Clinical Data: Shortness of breath; respiratory distress.  PORTABLE CHEST - 1 VIEW  Comparison: Chest radiograph performed 01/31/2009, and CT of the chest performed 02/04/2009  Findings: The lungs are well-aerated.  Bibasilar airspace opacities are noted, with underlying vascular congestion.  This may reflect pulmonary edema or multifocal pneumonia.  Blunting of the left costophrenic angle raises question for a small left pleural effusion.  The cardiomediastinal silhouette is mildly enlarged.  No acute osseous abnormalities are seen.  IMPRESSION: Bibasilar airspace opacities, with underlying vascular congestion and mild cardiomegaly.  The appearance may reflect pulmonary edema or multifocal pneumonia.  Question of small left pleural effusion.  Original Report Authenticated By: Tonia Ghent, M.D.      Assessment: Present on Admission:  .Pulmonary edema .SOB (shortness of breath) .DIABETES MELLITUS, TYPE II .HYPERLIPIDEMIA .OBSTRUCTIVE SLEEP APNEA .GOUT .HYPERTENSION .Essential hypertension, benign .COPD, MILD .RENAL INSUFFICIENCY .BREAST CANCER; OTHER .EXOGENOUS OBESITY .ANXIETY .DEEP VENOUS THROMBOPHLEBITIS, LEG, LEFT .LOW BACK PAIN .DEPRESSION .Edema   Plan:    Admitted to [facility] ICU Bed Initiate the CHF Protocol,  Cardiac Enzymes, supplemental O2 PRN Diurese with Zaroxyln and IV Lasix and give supplemental K+ Add Carvedilol RX, Patient had previously been on this Monitor Bun/ and Cr and K+ levels Check Uric Acid level.   Reconcile Home Meds Repeat EKG due to Poor quality.   Lovenox Bridging with Adjustment in Coumadin Rx since subtherapeutic at this time.   Other plans as per orders.    CODE STATUS:      FULL CODE     DO NOT RESUSCITATE (DNR)     DO NOT INTUBATE (DNI)  Critical care time: 60 minutes.  Gaynor Genco C 07/13/2011, 8:55 AM

## 2011-07-13 NOTE — ED Notes (Signed)
Patient currently resting quietly in bed; no respiratory or acute distress noted.  Informed patient that report is being called and a bed is ready; patient has no other questions or concerns at this time.  Will continue to monitor.

## 2011-07-13 NOTE — ED Notes (Signed)
Respiratory paged and informed that patient is being transported upstairs.

## 2011-07-13 NOTE — ED Notes (Signed)
Report given to 2100; informed RN that she can call back with any questions/concerns about patient once patient arrives to floor.  Preparing patient for transport.

## 2011-07-13 NOTE — ED Notes (Signed)
Dr. Lovell Sheehan paged back; states that she will see patient on 2100 after she arrives to floor.

## 2011-07-13 NOTE — ED Notes (Signed)
Venous blood gas collected by lab tech.

## 2011-07-13 NOTE — Progress Notes (Signed)
VASCULAR LAB PRELIMINARY  PRELIMINARY  PRELIMINARY  PRELIMINARY  Bilateral lower extremity venous duplex  completed.    Preliminary report:  Bilateral:  No evidence of DVT, superficial thrombosis, or Baker's Cyst.  Patient refused to have mid to distal thigh compressed due to pain.    Terance Hart, RVT 07/13/2011, 2:44 PM

## 2011-07-13 NOTE — ED Notes (Signed)
Dr. Read Drivers asked about temporary orders; states that they are in the chart.

## 2011-07-13 NOTE — ED Notes (Signed)
Calling report now. 

## 2011-07-13 NOTE — Progress Notes (Signed)
ANTICOAGULATION CONSULT NOTE - Initial Consult  Pharmacy Consult for Lovenox/Coumadin Indication: Afib  Allergies  Allergen Reactions  . Atorvastatin Other (See Comments)    Muscle aches  . Colesevelam Other (See Comments)    unknown   Patient Measurements: Weight: 226 lb 10.1 oz (102.8 kg)  Vital Signs: Temp: 98.3 F (36.8 C) (02/18 0216) Temp src: Oral (02/18 0216) BP: 130/65 mmHg (02/18 0500) Pulse Rate: 64  (02/18 0500)  Labs:  Basename 07/13/11 0228 07/13/11 0219 07/13/11 0217  HGB 13.3 -- 11.9*  HCT 39.0 -- 36.3  PLT -- -- 291  APTT -- -- --  LABPROT -- 17.8* --  INR -- 1.44 --  HEPARINUNFRC -- -- --  CREATININE 1.80* -- --  CKTOTAL -- -- --  CKMB -- -- --  TROPONINI -- -- --   Medical History: Past Medical History  Diagnosis Date  . OSA on CPAP   . Anemia   . Anxiety states   . Depressive disorder, not elsewhere classified   . Type II or unspecified type diabetes mellitus without mention of complication, not stated as uncontrolled   . Other and unspecified hyperlipidemia   . Unspecified essential hypertension   . Renal insufficiency   . Osteoarthritis   . Gout   . Iritis   . Stroke    Medications:  Prescriptions prior to admission  Medication Sig Dispense Refill  . anastrozole (ARIMIDEX) 1 MG tablet Take 1 mg by mouth daily.      [name] BIOTIN PO Take 1 tablet by mouth daily.      . brimonidine (ALPHAGAN) 0.15 % ophthalmic solution Place 1 drop into both eyes daily.       [name] buPROPion (WELLBUTRIN SR) 150 MG 12 hr tablet Take 150 mg by mouth daily.      . calcium carbonate (CALCIUM 500) 1250 MG tablet Take 1 tablet by mouth daily.      . clorazepate (TRANXENE) 7.5 MG tablet Take 7.5 mg by mouth 2 (two) times daily as needed. For anxiety      . enalapril (VASOTEC) 10 MG tablet Take 10 mg by mouth daily.      . Febuxostat 80 MG TABS Take 1 tablet by mouth daily.      . Fluticasone-Salmeterol (ADVAIR) 100-50 MCG/DOSE AEPB Inhale 1 puff into the lungs every  12 (twelve) hours.      [name] levothyroxine (SYNTHROID, LEVOTHROID) 137 MCG tablet Take 137 mcg by mouth daily.      . naproxen sodium (ANAPROX) 220 MG tablet Take 220 mg by mouth every 12 (twelve) hours as needed. For pain      . omeprazole (PRILOSEC) 40 MG capsule Take 40 mg by mouth daily.       . prednisoLONE acetate (PRED FORTE) 1 % ophthalmic suspension Place 1 drop into both eyes daily as needed. For eye pain      . timolol (TIMOPTIC) 0.5 % ophthalmic solution Place 1 drop into both eyes 2 (two) times daily. Twice daily      . traMADol (ULTRAM) 50 MG tablet Take 50-100 mg by mouth 2 (two) times daily as needed. For pain      . vitamin D, CHOLECALCIFEROL, 400 UNITS tablet Take 400 Units by mouth daily.       [name] warfarin (COUMADIN) 1 MG tablet Take 2-3 mg by mouth See admin instructions. Takes 3 mg on Mon, and Wed. Takes 2mg   Tues,Thurs, Fri, Sat Sun       Scheduled:    .  carvedilol  3.125 mg Oral BID WC  . cefTRIAXone (ROCEPHIN)  IV  1 g Intravenous Once  . enoxaparin (LOVENOX) injection  100 mg Subcutaneous Q12H  . furosemide      . furosemide      . furosemide  40 mg Intravenous Q12H  . insulin aspart  0-5 Units Subcutaneous QHS  . insulin aspart  0-9 Units Subcutaneous TID WC  . metolazone  2.5 mg Oral BID  . potassium chloride  20 mEq Oral Once  . sodium chloride  3 mL Intravenous Q12H  . warfarin  6 mg Oral ONCE-1800   Assessment: 75yo female on Coumadin PTA managed by hem/onc presents with SOB that resolved with BiPAP (on CPAP at home), admitted to MICU, to begin Lovenox for subtherapeutic INR and continue Coumadin during admission.  Goal of Therapy:  INR 2-3, Anti-Xa 0.6-1.2   Plan:  Will begin Lovenox 100mg  SQ Q12H.  Will monitor CBC and SCr and adjust dosing if SCr rises.  Will give boosted Coumadin dose of 6mg  x1 today (normally takes 3mg  on Mon and Wed and 2mg  rest of week) and adjust per INR.  Colleen Can  PharmD BCPS 07/13/2011,6:27 AM

## 2011-07-13 NOTE — ED Notes (Signed)
X-ray at bedside

## 2011-07-13 NOTE — Progress Notes (Signed)
  Echocardiogram 2D Echocardiogram has been performed.  Cathie Beams Deneen 07/13/2011, 4:40 PM

## 2011-07-13 NOTE — ED Notes (Signed)
Bed is ready for patient; no admission orders have been put in computer (admitting MD has not been by to see patient); will ask EDP about temporary orders.

## 2011-07-13 NOTE — ED Provider Notes (Signed)
History     CSN: [id number]  Arrival date & time 07/13/11  0204   First MD Initiated Contact with Patient 07/13/11 0209      Chief Complaint  Patient presents with  . Shortness of Breath    (Consider location/radiation/quality/duration/timing/severity/associated sxs/prior treatment) HPI Level V caveat: On CPAP This is a 76 year old white female who had the sudden onset of dyspnea and she was getting ready for bed this morning. EMS was summoned and they found her to be hypoxic with diffuse rales. She was placed on CPAP and given 4 mg of morphine and 52 mg of Lasix IV. Her dyspnea improved but has not abated. She's not had any chest pain. She was diaphoretic on initial evaluation but this is resolved. Her initial blood pressure was noted to be 220/120, the most recent blood pressure was noted to be 177/80.  Past Medical History  Diagnosis Date  . OSA on CPAP   . Anemia   . Anxiety states   . Depressive disorder, not elsewhere classified   . Type II or unspecified type diabetes mellitus without mention of complication, not stated as uncontrolled   . Other and unspecified hyperlipidemia   . Unspecified essential hypertension   . Renal insufficiency   . Osteoarthritis   . Gout   . Iritis   . Stroke     Past Surgical History  Procedure Date  . Mastectomy   . Breast lumpectomy   . Cataract extraction   . Foot surgery   . Mastectomy     Family History  Problem Relation Age of Onset  . Hypertension    . Heart disease Mother 30    CHF  . Cancer Mother     breast  . Heart disease Father 65  . Heart disease Brother     aaa    History  Substance Use Topics  . Smoking status: Former Smoker -- 2.0 packs/day for 40 years    Types: Cigarettes    Quit date: 05/25/2002  . Smokeless tobacco: Never Used  . Alcohol Use: No    OB History    Grav Para Term Preterm Abortions TAB SAB Ect Mult Living                  Review of Systems  Unable to perform ROS   Allergies   Atorvastatin and Colesevelam  Home Medications   Current Outpatient Rx  Name Route Sig Dispense Refill  . ANASTROZOLE 1 MG PO TABS  TAKE 1 TABLET BY MOUTH ONCE DAILY 90 tablet 0  . BRIMONIDINE TARTRATE 0.15 % OP SOLN Both Eyes Place 1 drop into both eyes daily.      . BUPROPION HCL ER (SR) 150 MG PO TB12 Oral Take 1 tablet (150 mg total) by mouth daily. 30 tablet prn  . CARVEDILOL 25 MG PO TABS Oral Take 1 tablet (25 mg total) by mouth 2 (two) times daily. 180 tablet 3  . CLORAZEPATE DIPOTASSIUM 7.5 MG PO TABS Oral Take 1 tablet (7.5 mg total) by mouth at bedtime. 90 tablet 0  . ENALAPRIL MALEATE 10 MG PO TABS Oral Take 1 tablet (10 mg total) by mouth daily. 90 tablet 3  . FEBUXOSTAT 80 MG PO TABS Oral Take 1 tablet (80 mg total) by mouth 1 dose over 24 hours. 90 tablet 3  . FLUTICASONE-SALMETEROL 100-50 MCG/DOSE IN AEPB Inhalation Inhale 1 puff into the lungs 2 (two) times daily. Rinse mouth 60 each 11  . LEVOTHYROXINE SODIUM 137  MCG PO TABS  TAKE 1 TABLET EVERY DAY 30 tablet 5  . OMEPRAZOLE 40 MG PO CPDR Oral Take 40 mg by mouth daily.      [name] PREDNISOLONE ACETATE 1 % OP SUSP  As needed    . TIMOLOL MALEATE 0.5 % OP SOLN  Twice daily    . TRAMADOL HCL 50 MG PO TABS Oral Take 1-2 tablets (50-100 mg total) by mouth 2 (two) times daily as needed for pain. 60 tablet 0  . VITAMIN D 400 UNITS PO TABS Oral Take 400 Units by mouth daily.      . WARFARIN SODIUM 1 MG PO TABS Oral Take 3 mg by mouth Daily. 2 mg on M W F      There were no vitals taken for this visit.  Physical Exam General: Well-developed, well-nourished female; appearance consistent with age of record HENT: normocephalic, atraumatic; on CPAP Eyes: pupils equal round and reactive to light; extraocular muscles intact Neck: supple Heart: regular rate and rhythm; no murmurs, rubs or gallops Lungs: Respirations assisted by CPAP; no rales appreciated; accessory muscle use Chest: Status post right mastectomy Abdomen: soft;  nondistended; nontender Extremities: No deformity; full range of motion; pulses normal Neurologic: Awake, alert; motor function intact in all extremities and symmetric; no facial droop Skin: Warm and dry Psychiatric: Normal mood and affect    ED Course  Procedures (including critical care time)     MDM  EKG Interpretation:  Date & Time: 07/13/2011 2:16AM  Rate: 77  Rhythm: normal sinus rhythm  QRS Axis: normal  Intervals: normal  ST/T Wave abnormalities: normal  Conduction Disutrbances:nonspecific intraventricular conduction delay  Narrative Interpretation: Poor R-wave progression  Old EKG Reviewed: No significant change  Nursing notes and vitals signs, including pulse oximetry, reviewed.  Summary of this visit's results, reviewed by myself:  Labs:  Results for orders placed during the hospital encounter of 07/13/11  CBC      Component Value Range   WBC 11.7 (*) 4.0 - 10.5 (K/uL)   RBC 4.21  3.87 - 5.11 (MIL/uL)   Hemoglobin 11.9 (*) 12.0 - 15.0 (g/dL)   HCT 96.0  45.4 - 09.8 (%)   MCV 86.2  78.0 - 100.0 (fL)   MCH 28.3  26.0 - 34.0 (pg)   MCHC 32.8  30.0 - 36.0 (g/dL)   RDW 11.9  14.7 - 82.9 (%)   Platelets 291  150 - 400 (K/uL)  DIFFERENTIAL      Component Value Range   Neutrophils Relative 87 (*) 43 - 77 (%)   Neutro Abs 10.1 (*) 1.7 - 7.7 (K/uL)   Lymphocytes Relative 6 (*) 12 - 46 (%)   Lymphs Abs 0.7  0.7 - 4.0 (K/uL)   Monocytes Relative 5  3 - 12 (%)   Monocytes Absolute 0.5  0.1 - 1.0 (K/uL)   Eosinophils Relative 3  0 - 5 (%)   Eosinophils Absolute 0.3  0.0 - 0.7 (K/uL)   Basophils Relative 0  0 - 1 (%)   Basophils Absolute 0.1  0.0 - 0.1 (K/uL)  PRO B NATRIURETIC PEPTIDE      Component Value Range   Pro B Natriuretic peptide (BNP) 4254.0 (*) 0 - 125 (pg/mL)  URINALYSIS, ROUTINE W REFLEX MICROSCOPIC      Component Value Range   Color, Urine YELLOW  YELLOW    APPearance CLOUDY (*) CLEAR    Specific Gravity, Urine 1.009  1.005 - 1.030    pH 5.5  5.0 - 8.0    Glucose, UA NEGATIVE  NEGATIVE (mg/dL)   Hgb urine dipstick TRACE (*) NEGATIVE    Bilirubin Urine NEGATIVE  NEGATIVE    Ketones, ur NEGATIVE  NEGATIVE (mg/dL)   Protein, ur 161 (*) NEGATIVE (mg/dL)   Urobilinogen, UA 0.2  0.0 - 1.0 (mg/dL)   Nitrite POSITIVE (*) NEGATIVE    Leukocytes, UA NEGATIVE  NEGATIVE   POCT I-STAT, CHEM 8      Component Value Range   Sodium 137  135 - 145 (mEq/L)   Potassium 5.1  3.5 - 5.1 (mEq/L)   Chloride 111  96 - 112 (mEq/L)   BUN 32 (*) 6 - 23 (mg/dL)   Creatinine, Ser 0.96 (*) 0.50 - 1.10 (mg/dL)   Glucose, Bld 045 (*) 70 - 99 (mg/dL)   Calcium, Ion 4.09  8.11 - 1.32 (mmol/L)   TCO2 19  0 - 100 (mmol/L)   Hemoglobin 13.3  12.0 - 15.0 (g/dL)   HCT 91.4  78.2 - 95.6 (%)  POCT I-STAT 3, BLOOD GAS (G3P V)      Component Value Range   pH, Ven 7.303 (*) 7.250 - 7.300    pCO2, Ven 35.1 (*) 45.0 - 50.0 (mmHg)   pO2, Ven 63.0 (*) 30.0 - 45.0 (mmHg)   Bicarbonate 17.4 (*) 20.0 - 24.0 (mEq/L)   TCO2 18  0 - 100 (mmol/L)   O2 Saturation 90.0     Acid-base deficit 8.0 (*) 0.0 - 2.0 (mmol/L)   Sample type VENOUS    URINE MICROSCOPIC-ADD ON      Component Value Range   WBC, UA 7-10  <3 (WBC/hpf)   RBC / HPF 3-6  <3 (RBC/hpf)   Bacteria, UA MANY (*) RARE    Casts HYALINE CASTS (*) NEGATIVE     Imaging Studies: Dg Chest Port 1 View  07/13/2011  *RADIOLOGY REPORT*  Clinical Data: Shortness of breath; respiratory distress.  PORTABLE CHEST - 1 VIEW  Comparison: Chest radiograph performed 01/31/2009, and CT of the chest performed 02/04/2009  Findings: The lungs are well-aerated.  Bibasilar airspace opacities are noted, with underlying vascular congestion.  This may reflect pulmonary edema or multifocal pneumonia.  Blunting of the left costophrenic angle raises question for a small left pleural effusion.  The cardiomediastinal silhouette is mildly enlarged.  No acute osseous abnormalities are seen.  IMPRESSION: Bibasilar airspace opacities, with  underlying vascular congestion and mild cardiomegaly.  The appearance may reflect pulmonary edema or multifocal pneumonia.  Question of small left pleural effusion.  Original Report Authenticated By: Tonia Ghent, M.D.   [facility] to admit.            Hanley Seamen, MD 07/13/11 [phone]

## 2011-07-13 NOTE — Progress Notes (Signed)
Report called to Bradly Chris; pt transferred to 2035 via bed on cardiac monitor and O2.

## 2011-07-13 NOTE — Progress Notes (Signed)
[facility]  Subjective: States that breathing is much better than last night. States that last night was unable to inspire significant breath and felt like she was having an asthma attack. Attempted to utilize CPAP but was unsuccessful for improving respiratory symptoms at home. Also endorses for the past several days the right lower extremity has been swollen, especially the right foot. Clarifies that is on Coumadin for history of DVT in 2009.  Objective: Vital signs in last 24 hours: Temp:  [97.7 F (36.5 C)-98.3 F (36.8 C)] 98.1 F (36.7 C) (02/18 1223) Pulse Rate:  [64-79] 64  (02/18 1000) Resp:  [14-30] 18  (02/18 1000) BP: (122-158)/(42-120) 158/61 mmHg (02/18 1000) SpO2:  [92 %-100 %] 99 % (02/18 1000) FiO2 (%):  [30 %] 30 % (02/18 0213) Weight:  [102.8 kg (226 lb 10.1 oz)] 102.8 kg (226 lb 10.1 oz) (02/18 0530) Weight change:    Intake/Output from previous day: 02/17 0701 - 02/18 0700 In: 110 [P.O.:100; I.V.:10] Out: 1600 [Urine:1600] Intake/Output this shift: Total I/O In: 140 [P.O.:120; I.V.:20] Out: 500 [Urine:500]  F/u exam completed  Lab Results:  Basename 07/13/11 0920 07/13/11 0228 07/13/11 0217  WBC 11.5* -- 11.7*  HGB 11.5* 13.3 --  HCT 35.3* 39.0 --  PLT 270 -- 291   BMET  Basename 07/13/11 0920 07/13/11 0228  NA 137 137  K 5.4* 5.1  CL 105 111  CO2 18* --  GLUCOSE 101* 285*  BUN 33* 32*  CREATININE 1.76* 1.80*  CALCIUM 9.5 --   Studies/Results: Dg Chest Port 1 View  07/13/2011  *RADIOLOGY REPORT*  Clinical Data: Shortness of breath; respiratory distress.  PORTABLE CHEST - 1 VIEW  Comparison: Chest radiograph performed 01/31/2009, and CT of the chest performed 02/04/2009  Findings: The lungs are well-aerated.  Bibasilar airspace opacities are noted, with underlying vascular congestion.  This may reflect pulmonary edema or multifocal pneumonia.  Blunting of the left costophrenic angle raises question for a small left pleural effusion.  The  cardiomediastinal silhouette is mildly enlarged.  No acute osseous abnormalities are seen.  IMPRESSION: Bibasilar airspace opacities, with underlying vascular congestion and mild cardiomegaly.  The appearance may reflect pulmonary edema or multifocal pneumonia.  Question of small left pleural effusion.  Original Report Authenticated By: Tonia Ghent, M.D.   Medications:  I have reviewed the patient's current medications.  Assessment/Plan:   Acute respiratory failure with hypoxia secondary to a) Flash pulmonary edema  b) acute bronchospasm with COPD (chronic obstructive pulmonary disease) *History consistent with either acute bronchospastic COPD exacerbation leading to respiratory distress leading to uncontrolled hypertension leading to flash pulmonary edema versus primary pulmonary edema/CHF etiology *Has improved markedly since admission-continue IV Lasix 40 mg every 12 hours and follow electrolyte panel *2-D echocardiogram pending-pro BNP elevated at 4254 this is in setting of stage IV chronic kidney disease *Continue supportive care with oxygen and nebulizers *We'll continue empiric antibiotic coverage with Levaquin in event this is atypical bronchitis or early pneumonia process  Positive/elevated troponin Is already fully anticoagulated - Consult to Cardiology has been requested   Essential hypertension, benign *Carvedilol has been started this admission *Because of poor creatinine clearance ACE inhibitor from home is on hold *Expect echocardiogram will help guide choice of antihypertensive medication prior to discharge  Hx of DVT L leg / Lower extremity edema *History of DVT in 2009 and now with leg swelling right lower extremity *INR subtherapeutic at presentation-check venous Dopplers of lower extremity to rule out acute DVT *On full  dose Lovenox every 12 hours *Pharmacy dosing Coumadin  Urinary tract infection/leukocytosis *Received Rocephin in the emergency department but not  reordered at admission so will begin Levaquin *Followup on urine culture   DIABETES MELLITUS, TYPE II *CBGs controlled since admission *Continue sliding scale insulin   OBSTRUCTIVE SLEEP APNEA *Continue our sleep CPAP  Hypothyroidism *Continue Synthroid   Atrial fibrillation *Currently maintaining sinus rhythm and patient denies history of atrial fibrillation   HYPERLIPIDEMIA *Not on medications prior to admission   RENAL INSUFFICIENCY/stage IV chronic kidney disease *Baseline creatinine between 1.5 and 1.7   Disposition *Transfer to telemetry floor   LOS: 0 days   Junious Silk, ANP pager [phone]  [facility] 8 [email or web address] Password: [email or web address]  07/13/2011, 12:43 PM  I have personally examined this patient and reviewed the entire database. I have reviewed the above note, made any necessary editorial changes, and agree with its content.  Lonia Blood, MD [facility]

## 2011-07-13 NOTE — ED Notes (Signed)
Lab at bedside

## 2011-07-13 NOTE — ED Notes (Signed)
Per EMS, while patient started getting ready for bed, she became short of breath.  Patient was diaphoretic upon EMS arrival; medic on scene heard rales.  Patient has had 4 mg Morphine; 52 mg Lasix.  Patient placed on CPAP; patient states that she feels better after being placed on CPAP. Initial pressure 220/120.  Last pressure 177/80.

## 2011-07-14 ENCOUNTER — Other Ambulatory Visit: Payer: Self-pay

## 2011-07-14 ENCOUNTER — Inpatient Hospital Stay (HOSPITAL_COMMUNITY): Payer: Medicare Other

## 2011-07-14 DIAGNOSIS — I509 Heart failure, unspecified: Secondary | ICD-10-CM

## 2011-07-14 LAB — GLUCOSE, CAPILLARY
Glucose-Capillary: 110 mg/dL — ABNORMAL HIGH (ref 70–99)
Glucose-Capillary: 138 mg/dL — ABNORMAL HIGH (ref 70–99)
Glucose-Capillary: 193 mg/dL — ABNORMAL HIGH (ref 70–99)
Glucose-Capillary: 105 mg/dL — ABNORMAL HIGH (ref 70–99)
Glucose-Capillary: 110 mg/dL — ABNORMAL HIGH (ref 70–99)

## 2011-07-14 LAB — CBC
MCH: 28.1 pg (ref 26.0–34.0)
MCHC: 33 g/dL (ref 30.0–36.0)
MCV: 85.2 fL (ref 78.0–100.0)
Platelets: 307 10*3/uL (ref 150–400)
RDW: 13.6 % (ref 11.5–15.5)

## 2011-07-14 LAB — BASIC METABOLIC PANEL
CO2: 23 mEq/L (ref 19–32)
Calcium: 10.3 mg/dL (ref 8.4–10.5)
Creatinine, Ser: 1.84 mg/dL — ABNORMAL HIGH (ref 0.50–1.10)
Glucose, Bld: 120 mg/dL — ABNORMAL HIGH (ref 70–99)

## 2011-07-14 LAB — URINE CULTURE

## 2011-07-14 MED ORDER — LEVOFLOXACIN 750 MG PO TABS
750.0000 mg | ORAL_TABLET | ORAL | Status: DC
  Administered 2011-07-14: 750 mg via ORAL
  Filled 2011-07-14: qty 1

## 2011-07-14 MED ORDER — METHOCARBAMOL 500 MG PO TABS
500.0000 mg | ORAL_TABLET | Freq: Four times a day (QID) | ORAL | Status: DC | PRN
  Administered 2011-07-14 – 2011-07-20 (×14): 500 mg via ORAL
  Filled 2011-07-14 (×15): qty 1

## 2011-07-14 MED ORDER — ISOSORB DINITRATE-HYDRALAZINE 20-37.5 MG PO TABS
1.0000 | ORAL_TABLET | Freq: Two times a day (BID) | ORAL | Status: DC
  Administered 2011-07-14 – 2011-07-21 (×15): 1 via ORAL
  Filled 2011-07-14 (×16): qty 1

## 2011-07-14 NOTE — Progress Notes (Signed)
Pt. Is currently on CPAP via home nasal mask. Settings of 7.0 cm H20, with 2 lpm O2 bleed in. Pt. Tolerating well.

## 2011-07-14 NOTE — Progress Notes (Signed)
   CARE MANAGEMENT NOTE 07/14/2011  Patient:  Wendy Cole, Wendy Cole   Account Number:  [id number]  Date Initiated:  07/14/2011  Documentation initiated by:  Onnie Boer  Subjective/Objective Assessment:   PT WAS ADMITTED WITH PULM EDEMA     Action/Plan:   PROGRESSION OF CARE AND DISCHARGE PLANNING   Anticipated DC Date:  07/18/2011   Anticipated DC Plan:  HOME W HOME HEALTH SERVICES      DC Planning Services  CM consult      Choice offered to / List presented to:             Status of service:  In process, will continue to follow Medicare Important Message given?   (If response is "NO", the following Medicare IM given date fields will be blank) Date Medicare IM given:   Date Additional Medicare IM given:    Discharge Disposition:    Per UR Regulation:  Reviewed for med. necessity/level of care/duration of stay  Comments:  UR COMPLETED.  Onnie Boer, RN, BSN 07/14/11 1222 PT WAS ADMITTED FROM HOME WITH THE ABOVE DIAGNOSIS. ENZYMES ARE POS AND TROP IS ELEVATED.  LEBAEUR HAS BEEN CONSULTED.  WILL F/U ON PT/OT EVAL ONCE MEDICALLY ABLE TO TOLERATE THERAPY.

## 2011-07-14 NOTE — Progress Notes (Signed)
Subjective: Denies CP and SOB. patient complaining of leg cramps, especially with ongoing lasix treatment.   Objective: Vital signs in last 24 hours: Temp:  [96.6 F (35.9 C)-98.6 F (37 C)] 98 F (36.7 C) (02/19 0618) Pulse Rate:  [66-104] 68  (02/19 0618) Resp:  [20] 20  (02/19 0618) BP: (158-173)/(77-85) 158/77 mmHg (02/19 0618) SpO2:  [92 %-100 %] 94 % (02/19 0618) Weight:  [101.1 kg (222 lb 14.2 oz)] 101.1 kg (222 lb 14.2 oz) (02/19 1610) Weight change: -1.7 kg (-3 lb 12 oz) Last BM Date: 07/12/11  Intake/Output from previous day: 02/18 0701 - 02/19 0700 In: 380 [P.O.:360; I.V.:20] Out: 4550 [Urine:4550]     Physical Exam: General: Alert, awake, oriented x3, in no acute distress. HEENT: No bruits, no goiter. Heart: Regular rate and rhythm, positive systolic murmur, No rubs, No gallops. Lungs: Clear to auscultation bilaterally. Abdomen: Soft, nontender, nondistended, positive bowel sounds. Extremities: No clubbing cyanosis or edema with positive pedal pulses. Neuro: Grossly intact, nonfocal.   Lab Results: Basic Metabolic Panel:  Basename 07/14/11 0548 07/13/11 0920  NA 137 137  K 4.2 5.4*  CL 101 105  CO2 23 18*  GLUCOSE 120* 101*  BUN 33* 33*  CREATININE 1.84* 1.76*  CALCIUM 10.3 9.5  MG 1.8 --  PHOS 4.3 --   CBC:  Basename 07/14/11 0548 07/13/11 0920 07/13/11 0217  WBC 8.9 11.5* --  NEUTROABS -- 9.8* 10.1*  HGB 12.0 11.5* --  HCT 36.4 35.3* --  MCV 85.2 86.3 --  PLT 307 270 --   Cardiac Enzymes:  Basename 07/14/11 0548 07/13/11 2141 07/13/11 1340  CKTOTAL 60 73 77  CKMB 6.4* 7.1* 8.4*  CKMBINDEX -- -- --  TROPONINI 1.06* 1.30* 1.71*   BNP:  Basename 07/13/11 0217  PROBNP 4254.0*   CBG:  Basename 07/14/11 1110 07/14/11 0616 07/13/11 2111 07/13/11 1630 07/13/11 1221 07/13/11 0827  GLUCAP 193* 138* 123* 94 121* 111*   Hemoglobin A1C:  Basename 07/13/11 0920  HGBA1C 6.2*   Thyroid Function Tests:  Basename 07/13/11 0920  TSH  5.406*  T4TOTAL --  FREET4 --  T3FREE --  THYROIDAB --   Coagulation:  Basename 07/14/11 0548 07/13/11 0219  LABPROT 20.8* 17.8*  INR 1.76* 1.44   Urinalysis:  Basename 07/13/11 0234  COLORURINE YELLOW  LABSPEC 1.009  PHURINE 5.5  GLUCOSEU NEGATIVE  HGBUR TRACE*  BILIRUBINUR NEGATIVE  KETONESUR NEGATIVE  PROTEINUR 100*  UROBILINOGEN 0.2  NITRITE POSITIVE*  LEUKOCYTESUR NEGATIVE   Misc. Labs:  Recent Results (from the past 240 hour(s))  URINE CULTURE     Status: Normal (Preliminary result)   Collection Time   07/13/11  2:34 AM      Component Value Range Status Comment   Specimen Description URINE, CATHETERIZED   Final    Special Requests ADDED 07/13/11 0337   Final    Culture  Setup Time [id number]   Final    Colony Count >=100,000 COLONIES/ML   Final    Culture ESCHERICHIA COLI   Final    Report Status PENDING   Incomplete   MRSA PCR SCREENING     Status: Normal   Collection Time   07/13/11  5:24 AM      Component Value Range Status Comment   MRSA by PCR NEGATIVE  NEGATIVE  Final     Studies/Results: Dg Chest Port 1 View  07/14/2011  *RADIOLOGY REPORT*  Clinical Data: Pulmonary edema, follow up effusions  PORTABLE CHEST - 1 VIEW  Comparison:  07/13/2011  Findings: Cardiomegaly again noted.  Central mild vascular congestion without convincing edema.  There is mild improvement in aeration.  Residual mild basilar atelectasis.  Linear atelectasis or infiltrate in lingula.  IMPRESSION: No convincing pulmonary edema.  Slight improved aeration.  Mild basilar atelectasis.  Linear atelectasis or infiltrate in lingula. Cardiomegaly again noted.  Original Report Authenticated By: Natasha Mead, M.D.   Dg Chest Port 1 View  07/13/2011  *RADIOLOGY REPORT*  Clinical Data: Shortness of breath; respiratory distress.  PORTABLE CHEST - 1 VIEW  Comparison: Chest radiograph performed 01/31/2009, and CT of the chest performed 02/04/2009  Findings: The lungs are well-aerated.  Bibasilar  airspace opacities are noted, with underlying vascular congestion.  This may reflect pulmonary edema or multifocal pneumonia.  Blunting of the left costophrenic angle raises question for a small left pleural effusion.  The cardiomediastinal silhouette is mildly enlarged.  No acute osseous abnormalities are seen.  IMPRESSION: Bibasilar airspace opacities, with underlying vascular congestion and mild cardiomegaly.  The appearance may reflect pulmonary edema or multifocal pneumonia.  Question of small left pleural effusion.  Original Report Authenticated By: Tonia Ghent, M.D.    Medications: Scheduled Meds:    . anastrozole  1 mg Oral Daily  . aspirin  81 mg Oral Daily  . brimonidine  1 drop Both Eyes Daily  . carvedilol  3.125 mg Oral BID WC  . enoxaparin (LOVENOX) injection  100 mg Subcutaneous Q12H  . furosemide      . furosemide      . insulin aspart  0-5 Units Subcutaneous QHS  . insulin aspart  0-9 Units Subcutaneous TID WC  . isosorbide-hydrALAZINE  1 tablet Oral BID  . levofloxacin (LEVAQUIN) IV  750 mg Intravenous Q48H  . levofloxacin  750 mg Oral Q48H  . levothyroxine  137 mcg Oral Daily  . pantoprazole  40 mg Oral Q1200  . pneumococcal 23 valent vaccine  0.5 mL Intramuscular Tomorrow-1000  . sodium chloride  3 mL Intravenous Q12H  . timolol  1 drop Both Eyes BID  . vitamin D (CHOLECALCIFEROL)  400 Units Oral Daily  . warfarin  6 mg Oral ONCE-1800  . DISCONTD: brimonidine  1 drop Both Eyes Daily  . DISCONTD: furosemide  40 mg Intravenous Q12H  . DISCONTD: metolazone  2.5 mg Oral Q12H   Continuous Infusions:  PRN Meds:.sodium chloride, acetaminophen, clorazepate, ondansetron (ZOFRAN) IV, prednisoLONE acetate, sodium chloride, zolpidem  Assessment/Plan: 1-Flash pulmonary edema 2/2 acute on chronic systolic/diastolic heart failure: Cardiology now on board; they might do cath as inpatient; will follow recommendations. Continue lasix and start bidil. Will also continue  ASA.  2-DIABETES MELLITUS, TYPE II: stable, continue current regimen.  3-HYPERLIPIDEMIA: low fat diet; patient with severe myalgia and leg cramping, will hold on statins for now.  4-OBSTRUCTIVE SLEEP APNEA with pulmonary HTN: continue CPAP.  5-Essential hypertension, benign: add bidil for better control, continue B-blocker and lasix.  6-DEEP VENOUS THROMBOPHLEBITIS, LEG, LEFT: continue coumadin.  7-Chronic kidney disease (CKD), stage IV (severe): stable; continue monitoring Cr trend.  8-Atrial fibrillation: rate control and on anticoagulation.  9-UTI (lower urinary tract infection): continue levaquin; will change to PO. Day #2/5  10-glaucoma: continue eye drops.  11-hypothyroidism: continue synthroid.  12-DVT: continue lovenox while INR is therapeutic.     LOS: 1 day   Shery Wauneka [facility] [phone]  07/14/2011, 12:27 PM

## 2011-07-14 NOTE — Consult Note (Signed)
CARDIOLOGY CONSULT NOTE   [name] MRN: [id number] DOB/AGE: Aug 14, 1935 76 y.o.  Admit date: 07/13/2011  Primary Physician   Sonda Primes, MD, MD Primary Cardiologist   None (Dr Wendy Cole in 1998) Reason for Consultation:   CHF  XBM:WUXL C Wendy Cole is a 76 y.o. female with no history of CAD.   She was admitted yesterday with SOB and chest pain. Her cardiac enzymes and BNP were elevated to cardiology was asked to evaluate her.   The patient's symptoms began on to July 12, 2011 that evening. She was in her usual state of health until she got up to go to bed. She then had sudden onset of shortness of breath. She also describes orthopnea and PND. She describes some chest pain but states that she has had chest pain intermittently for a long time. The chest pain is extremely brief, lasting only a second or so. It is sharp. It reaches a 3 or 4/10. She also gets occasional palpitations and "skipped beats". She feels these but is otherwise asymptomatic. She has no history of exertional chest pain but does not exert herself very much secondary to musculoskeletal issues including arthritis and gout. She has had lower extremity swelling, right greater than left, especially pedal edema recently. Currently, she is having cramps in her lower extremities and abdomen which she states she gets while on diuretics. In the past she has been on furosemide as well as torsemide but stopped them both because of the side effects.   Past Medical History  Diagnosis Date  . OSA on CPAP   . Anemia   . Anxiety states   . Depressive disorder, not elsewhere classified   . Type II or unspecified type diabetes mellitus without mention of complication, not stated as uncontrolled   . Other and unspecified hyperlipidemia   . Unspecified essential hypertension   . Renal insufficiency/ CKD   . Osteoarthritis   . Gout   . Iritis    Breast Cancer   . Stroke      Past Surgical History    Procedure Date  . Mastectomy   . Breast lumpectomy   . Cataract extraction   . Foot surgery   . Mastectomy    Hysterectomy    Right parotid gland removed         Allergies  Allergen Reactions  . Atorvastatin Other (See Comments)    Muscle aches  . Colesevelam Other (See Comments)    unknown    I have reviewed the patient's current medications. Scheduled:   . anastrozole  1 mg Oral Daily  . aspirin  81 mg Oral Daily  . brimonidine  1 drop Both Eyes Daily  . carvedilol  3.125 mg Oral BID WC  . enoxaparin (LOVENOX) injection  100 mg Subcutaneous Q12H  . furosemide      . furosemide      . furosemide  40 mg Intravenous Q12H  . insulin aspart  0-5 Units Subcutaneous QHS  . insulin aspart  0-9 Units Subcutaneous TID WC  . levofloxacin (LEVAQUIN) IV  750 mg Intravenous Q48H  . levothyroxine  137 mcg Oral Daily  . metolazone  2.5 mg Oral Q12H  . pantoprazole  40 mg Oral Q1200  . pneumococcal 23 valent vaccine  0.5 mL Intramuscular Tomorrow-1000  . sodium chloride  3 mL Intravenous Q12H  . timolol  1 drop Both Eyes BID  . vitamin D (CHOLECALCIFEROL)  400 Units Oral Daily  . warfarin  6 mg Oral ONCE-1800  . DISCONTD: brimonidine  1 drop Both Eyes Daily   Continuous:  Medication Sig  . anastrozole (ARIMIDEX) 1 MG tablet Take 1 mg by mouth daily.  [name] BIOTIN PO Take 1 tablet by mouth daily.  . brimonidine (ALPHAGAN) 0.15 % ophthalmic solution Place 1 drop into both eyes daily.   [name] buPROPion (WELLBUTRIN SR) 150 MG 12 hr tablet Take 150 mg by mouth daily.  . calcium carbonate (CALCIUM 500) 1250 MG tablet Take 1 tablet by mouth daily.  . clorazepate (TRANXENE) 7.5 MG tablet Take 7.5 mg by mouth 2 (two) times daily as needed.  . enalapril (VASOTEC) 10 MG tablet Take 10 mg by mouth daily.  . Febuxostat 80 MG TABS Take 1 tablet by mouth daily.  . Fluticasone-Salmeterol (ADVAIR) 100-50 MCG/DOSE AEPB Inhale 1 puff into the lungs every 12 (twelve) hours.  [name] levothyroxine (SYNTHROID,  LEVOTHROID) 137 MCG tablet Take 137 mcg by mouth daily.  . naproxen sodium (ANAPROX) 220 MG tablet Take 220 mg by mouth every 12 (twelve) hours as needed.   [name] omeprazole (PRILOSEC) 40 MG capsule Take 40 mg by mouth daily.   . prednisoLONE acetate (PRED FORTE) 1 % ophthalmic suspension Place 1 drop into both eyes daily as needed. For eye pain  . timolol (TIMOPTIC) 0.5 % ophthalmic solution Place 1 drop into both eyes 2 (two) times daily. Twice daily  . traMADol (ULTRAM) 50 MG tablet Take 50-100 mg by mouth 2 (two) times daily as needed.  . vitamin D, CHOLECALCIFEROL, 400 UNITS  Take 400 Units by mouth daily.   [name] warfarin (COUMADIN) 1 MG tablet Take 2-3 mg by mouth See admin instructions. Takes 3 mg on Mon, and Wed. Takes 2mg   Tues,Thurs, Fri, Sat Sun      History   Social History  . Marital Status: Married    Spouse Name: N/A    Number of Children: N/A  . Years of Education: N/A   Occupational History  . Retired [employer]    Social History Main Topics  . Smoking status: Former Smoker -- 2.0 packs/day for 40 years    Types: Cigarettes    Quit date: 05/25/2002  . Smokeless tobacco: Never Used  . Alcohol Use: No  . Drug Use: No  . Sexually Active: Not Currently   Social History Narrative  . son lives with her     Family History  Problem Relation Age of Onset  . Hypertension    . Heart disease Mother 40    CHF  . Cancer Mother     breast  . Heart disease Father 53  . Heart disease Brother     aaa     ROS: chronic MS aches/pains. Hx iliac thrombus - reason for coumadin, does not notice bleeding, melena. No sig reflux symptoms.  Admits to LE edema, right > left but is dependent and chronic. No recent illnesses, fevers, chills. Full 14 point review of systems complete and found to be negative unless listed  above  Physical Exam: Blood pressure 158/77, pulse 68, temperature 98 F (36.7 C), temperature source Oral, resp. rate 20, height 5' 4.96" (1.65 m), weight 222 lb  14.2 oz (101.1 kg), SpO2 94.00%.   General: Well developed, well nourished, female in no acute distress at rest Head: Eyes PERRLA, No xanthomas.   Normocephalic and atraumatic, oropharynx without edema or exudate. Dentition ok Lungs: Bilateral basilar rales but no crackles noted Heart: HRRR S1 S2, no rub/gallop, 1-2/6  Murmur LUSB. pulses are 2+ & equal all 4 extrem.   Neck: Bilateral carotid bruits,  Left > right. No lymphadenopathy.  JVD approx 8 cm. Abdomen: Bowel sounds present, abdomen soft and non-tender without masses or hernias noted. Msk:  No spine or cva tenderness. Normal strength and tone for age, no joint deformities or effusions. Extremities: No clubbing or cyanosis. Trace - 1+ edema.  Neuro: Alert and oriented X 3. No focal deficits noted. Psych:  Good affect, responds appropriately Skin: No rashes or lesions noted.  Labs:   Lab Results  Component Value Date   WBC 8.9 07/14/2011   HGB 12.0 07/14/2011   HCT 36.4 07/14/2011   MCV 85.2 07/14/2011   PLT 307 07/14/2011    Basename 07/14/11 0548  INR 1.76*    Lab 07/14/11 0548  NA 137  K 4.2  CL 101  CO2 23  BUN 33*  CREATININE 1.84*  CALCIUM 10.3  PROT --  BILITOT --  ALKPHOS --  ALT --  AST --  GLUCOSE 120*    Basename 07/14/11 0548 07/13/11 2141 07/13/11 1340 07/13/11 0920  CKTOTAL 60 73 77 83  CKMB 6.4* 7.1* 8.4* 8.5*  TROPONINI 1.06* 1.30* 1.71* 1.03*   Pro B Natriuretic peptide (BNP)  Date/Time Value Range Status  07/13/2011  2:17 AM 4254.0* 0-125 (pg/mL) Final  09/16/2007  7:20 PM 271.0*  Final    TSH  Date/Time Value Range Status  07/13/2011  9:20 AM 5.406* 0.350-4.500 (uIU/mL) Final     Echo: 07/13/2011 Study Conclusions - Left ventricle: Systolic function was severely reduced. The estimated ejection fraction was in the range of 25% to 30%. Features are consistent with a pseudonormal left ventricular filling pattern, with concomitant abnormal relaxation and increased filling pressure (grade 2  diastolic dysfunction). Doppler parameters are consistent with elevated mean left atrial filling pressure.  - Regional wall motion abnormality: Akinesis of the basal-mid anteroseptal myocardium; hypokinesis of the entire anterior, mid inferolateral, basal-mid anterolateral, and apical lateral myocardium. - Aortic valve: Mild regurgitation. - Mitral valve: Mild regurgitation. - Left atrium: The atrium was moderately dilated. - Right atrium: The atrium was mildly dilated. - Pulmonary arteries: PA peak pressure: 49mm Hg (S). Impressions: - The right ventricular systolic pressure was increased consistent with mild pulmonary hypertension.   Radiology:   Dg Chest Port 1 View 07/13/2011  *RADIOLOGY REPORT*  Clinical Data: Shortness of breath; respiratory distress.  PORTABLE CHEST - 1 VIEW  Comparison: Chest radiograph performed 01/31/2009, and CT of the chest performed 02/04/2009  Findings: The lungs are well-aerated.  Bibasilar airspace opacities are noted, with underlying vascular congestion.  This may reflect pulmonary edema or multifocal pneumonia.  Blunting of the left costophrenic angle raises question for a small left pleural effusion.  The cardiomediastinal silhouette is mildly enlarged.  No acute osseous abnormalities are seen.  IMPRESSION: Bibasilar airspace opacities, with underlying vascular congestion and mild cardiomegaly.  The appearance may reflect pulmonary edema or multifocal pneumonia.  Question of small left pleural effusion.  Original Report Authenticated By: Tonia Ghent, M.D.   EKG: 14-Jul-2011 06:58:38  Normal sinus rhythm Possible Left atrial enlargement Left ventricular hypertrophy Inferior infarct , age undetermined T wave abnormality, consider lateral ischemia Abnormal ECG   ASSESSMENT AND PLAN:   The patient was seen today by Dr Ladona Ridgel, the patient evaluated and the data reviewed.  1. CHF - acute mixed systolic and diastolic - Lasix currently 40mg  IV q 12 h, continue  this for now, supp K+ and  Mg PRN to minimize cramping but K+ 4.2 this am and Mg 1.8. Follow RF closely.  Per pt, tonic water helps with cramps.  2. LVD - EF 60% in 2010 when she was diagnosed with a DVT in the setting of breast cancer. Her EF is lower and she needs cath but has CKD. Consider R/L with cors only to define anatomy and staged PCI, if needed.   3. Carotid bruits - check dopplers but can be done as OP.  4.  Otherwise, per primary MD   Signed: Theodore Demark 07/14/2011, 8:37 AM Co-Sign MD  Cardiology Attending  Patient seen and examined. I have reviewed findings of Wendy Cole and I corroborate her findings. She has worsening LV function, segmental wall motion abnormalities, and elevated troponin's. In addition, she has an extensive smoking history, stopping about 10 yrs ago. I would recommended left and right heart catheterization, uptitrate medical therapy for her CHF, and I have discussed the importance of a low sodium diet. Lewayne Bunting, M.D.

## 2011-07-15 DIAGNOSIS — I429 Cardiomyopathy, unspecified: Secondary | ICD-10-CM

## 2011-07-15 DIAGNOSIS — I5021 Acute systolic (congestive) heart failure: Secondary | ICD-10-CM

## 2011-07-15 LAB — BASIC METABOLIC PANEL
BUN: 34 mg/dL — ABNORMAL HIGH (ref 6–23)
CO2: 23 mEq/L (ref 19–32)
Chloride: 96 mEq/L (ref 96–112)
Creatinine, Ser: 1.92 mg/dL — ABNORMAL HIGH (ref 0.50–1.10)
GFR calc Af Amer: 29 mL/min — ABNORMAL LOW (ref >90)
Potassium: 3.9 mEq/L (ref 3.5–5.1)

## 2011-07-15 LAB — GLUCOSE, CAPILLARY
Glucose-Capillary: 138 mg/dL — ABNORMAL HIGH (ref 70–99)
Glucose-Capillary: 127 mg/dL — ABNORMAL HIGH (ref 70–99)
Glucose-Capillary: 103 mg/dL — ABNORMAL HIGH (ref 70–99)

## 2011-07-15 LAB — PROTIME-INR
INR: 2.2 — ABNORMAL HIGH (ref 0.00–1.49)
Prothrombin Time: 24.8 seconds — ABNORMAL HIGH (ref 11.6–15.2)

## 2011-07-15 MED ORDER — FUROSEMIDE 20 MG PO TABS
20.0000 mg | ORAL_TABLET | Freq: Every day | ORAL | Status: DC
  Administered 2011-07-15: 20 mg via ORAL
  Filled 2011-07-15 (×2): qty 1

## 2011-07-15 MED ORDER — CIPROFLOXACIN HCL 500 MG PO TABS
500.0000 mg | ORAL_TABLET | Freq: Every day | ORAL | Status: DC
  Administered 2011-07-15 – 2011-07-18 (×4): 500 mg via ORAL
  Filled 2011-07-15 (×5): qty 1

## 2011-07-15 MED ORDER — CARVEDILOL 6.25 MG PO TABS
6.2500 mg | ORAL_TABLET | Freq: Two times a day (BID) | ORAL | Status: DC
  Administered 2011-07-15 – 2011-07-18 (×5): 6.25 mg via ORAL
  Filled 2011-07-15 (×8): qty 1

## 2011-07-15 MED ORDER — BRIMONIDINE TARTRATE 0.2 % OP SOLN
1.0000 [drp] | Freq: Two times a day (BID) | OPHTHALMIC | Status: DC
  Administered 2011-07-15 – 2011-07-21 (×12): 1 [drp] via OPHTHALMIC

## 2011-07-15 MED ORDER — SULFAMETHOXAZOLE-TMP DS 800-160 MG PO TABS
1.0000 | ORAL_TABLET | Freq: Two times a day (BID) | ORAL | Status: DC
  Filled 2011-07-15 (×2): qty 1

## 2011-07-15 NOTE — Evaluation (Signed)
Physical Therapy Evaluation Patient Details Name: Wendy Cole MRN: [id number] DOB: 1936/04/30 Today's Date: 07/15/2011  Problem List:  Patient Active Problem List  Diagnoses  . DIABETES MELLITUS, TYPE II  . HYPERLIPIDEMIA  . Unspecified Anemia  . OBSTRUCTIVE SLEEP APNEA  . Essential hypertension, benign  . DEEP VENOUS THROMBOPHLEBITIS, LEG, LEFT  . Status asthmaticus with COPD (chronic obstructive pulmonary disease)  . APHTHOUS STOMATITIS  . Chronic kidney disease (CKD), stage IV (severe)  . OSTEOARTHRITIS  . Polymyalgia rheumatica  . BUNION  . HYPERSOMNIA, ASSOCIATED WITH SLEEP APNEA  . Lower extremity edema  . Headache  . LIVER FUNCTION TESTS, ABNORMAL, HX OF  . Hyperkalemia  . OSA on CPAP  . Anemia  . Anxiety states  . Renal insufficiency  . Osteoarthritis  . Gout  . Stroke  . Flash pulmonary edema  . SOB (shortness of breath)  . Atrial fibrillation  . Acute respiratory failure with hypoxia  . Leukocytosis  . UTI (lower urinary tract infection)  . Cardiomyopathy secondary  . Acute systolic congestive heart failure    Past Medical History:  Past Medical History  Diagnosis Date  . OSA on CPAP   . Anemia   . Anxiety states   . Depressive disorder, not elsewhere classified   . Type II or unspecified type diabetes mellitus without mention of complication, not stated as uncontrolled   . Other and unspecified hyperlipidemia   . Unspecified essential hypertension   . Renal insufficiency   . Osteoarthritis   . Gout   . Iritis   . Stroke    Past Surgical History:  Past Surgical History  Procedure Date  . Mastectomy   . Breast lumpectomy   . Cataract extraction   . Foot surgery   . Mastectomy     PT Assessment/Plan/Recommendation PT Assessment Clinical Impression Statement: Pt is 76 yo female admitted with pulmonary edema who is feeling better than upon  admit  and is returning to baseline level of function.  No acute or f/u PT needs at this time.   If status changes, please re-order.  Thank you. PT Recommendation/Assessment: Patent does not need any further PT services No Skilled PT: Patient at baseline level of functioning;Patient is modified independent with all activity/mobility PT Recommendation Follow Up Recommendations: No PT follow up Equipment Recommended: None recommended by PT PT Goals  Acute Rehab PT Goals PT Goal Formulation:  (N/A) Time For Goal Achievement:  (N/A)  PT Evaluation Precautions/Restrictions  Precautions Required Braces or Orthoses: No Restrictions Weight Bearing Restrictions: No Prior Functioning  Home Living Lives With: Spouse Receives Help From: Family Type of Home: House Home Layout: One level Home Access: Ramped entrance Home Adaptive Equipment: Walker - rolling;Straight cane Prior Function Level of Independence: Independent with basic ADLs;Independent with homemaking with ambulation;Requires assistive device for independence;Independent with transfers Able to Take Stairs?: Yes Driving: No Vocation: Retired Comments: Pt often has gout flares of the left ankle and knee and has DVT right knee region, hence the use of cane and RW as needed Cognition Cognition Arousal/Alertness: Awake/alert Overall Cognitive Status: Appears within functional limits for tasks assessed Sensation/Coordination Sensation Light Touch: Appears Intact Stereognosis: Appears Intact Hot/Cold: Not tested Proprioception: Appears Intact Coordination Gross Motor Movements are Fluid and Coordinated: Yes Fine Motor Movements are Fluid and Coordinated: Yes Extremity Assessment RUE Assessment RUE Assessment: Exceptions to [facility] RUE Strength RUE Overall Strength Comments: decreased grip strength due to arthritis LUE Assessment LUE Assessment: Within Functional Limits RLE Assessment RLE Assessment:  Exceptions to [facility] RLE Strength RLE Overall Strength Comments: grossly 4/5, pt at baseline LLE Assessment LLE Assessment:  Exceptions to [facility] LLE Strength LLE Overall Strength Comments: grossly 4/5, pt at baseline Mobility (including Balance) Bed Mobility Bed Mobility: No Transfers Transfers: Yes Sit to Stand: 6: Modified independent (Device/Increase time);From bed;With upper extremity assist Stand to Sit: To bed;6: Modified independent (Device/Increase time);With upper extremity assist Ambulation/Gait Ambulation/Gait: Yes Ambulation/Gait Assistance: 6: Modified independent (Device/Increase time) Ambulation Distance (Feet): 350 Feet Assistive device: Rolling walker Gait Pattern: Step-through pattern;Antalgic Gait velocity: slightly decreased Stairs: No Wheelchair Mobility Wheelchair Mobility: No  Posture/Postural Control Posture/Postural Control: No significant limitations Balance Balance Assessed: Yes Dynamic Standing Balance Dynamic Standing - Balance Support: Bilateral upper extremity supported Dynamic Standing - Level of Assistance: 6: Modified independent (Device/Increase time) End of Session PT - End of Session Activity Tolerance: Patient tolerated treatment well Patient left: in bed;with call bell in reach;with family/visitor present Nurse Communication: Mobility status for ambulation General Behavior During Session: [facility] for tasks performed Cognition: [facility] for tasks performed  Wendy Cole, Wendy Cole  [phone] 07/15/2011, 11:02 AM

## 2011-07-15 NOTE — Progress Notes (Addendum)
[name], female   DOB: 09-26-1935, 76 y.o.   MRN: [id number]  Subjective: No events overnight. Patient denies chest pain, shortness of breath, abdominal pain.   Objective:  Vital signs in last 24 hours:  Filed Vitals:   07/14/11 1737 07/14/11 2028 07/14/11 2204 07/15/11 0510  BP: 175/82 198/84 158/62 174/72  Pulse:  66  60  Temp:  97.5 F (36.4 C)  98.1 F (36.7 C)  TempSrc:  Oral  Oral  Resp:  16  16  Height:      Weight:    99.519 kg (219 lb 6.4 oz)  SpO2:  94%  96%    Intake/Output from previous day:   Intake/Output Summary (Last 24 hours) at 07/15/11 0852 Last data filed at 07/15/11 3086  Gross per 24 hour  Intake    300 ml  Output   1950 ml  Net  -1650 ml    Physical Exam: General: Alert, awake, oriented x3, in no acute distress. HEENT: No bruits, no goiter. Moist mucous membranes, no scleral icterus, no conjunctival pallor. Heart: Regular rate and rhythm, S1/S2 +, no murmurs, rubs, gallops. Lungs: Clear to auscultation bilaterally. No wheezing, no rhonchi, no rales.  Abdomen: Soft, nontender, nondistended, positive bowel sounds. Extremities: No clubbing or cyanosis, trace bilateral pitting edema,  positive pedal pulses. Neuro: Grossly nonfocal.  Lab Results:  Basic Metabolic Panel:    Component Value Date/Time   NA 134* 07/15/2011 0445   K 3.9 07/15/2011 0445   CL 96 07/15/2011 0445   CO2 23 07/15/2011 0445   BUN 34* 07/15/2011 0445   CREATININE 1.92* 07/15/2011 0445   GLUCOSE 124* 07/15/2011 0445   GLUCOSE 81 04/09/2006 1716   CALCIUM 10.0 07/15/2011 0445   CALCIUM 9.6 09/27/2006 2054   CBC:    Component Value Date/Time   WBC 8.9 07/14/2011 0548   WBC 6.1 03/06/2011 1231   HGB 12.0 07/14/2011 0548   HGB 11.4* 03/06/2011 1231   HCT 36.4 07/14/2011 0548   HCT 34.9 03/06/2011 1231   PLT 307 07/14/2011 0548   PLT 240 03/06/2011 1231   MCV 85.2 07/14/2011 0548   MCV 89.3 03/06/2011 1231   NEUTROABS 9.8* 07/13/2011 0920   NEUTROABS 4.5  03/06/2011 1231   LYMPHSABS 0.9 07/13/2011 0920   LYMPHSABS 0.8* 03/06/2011 1231   MONOABS 0.7 07/13/2011 0920   MONOABS 0.5 03/06/2011 1231   EOSABS 0.1 07/13/2011 0920   EOSABS 0.3 03/06/2011 1231   BASOSABS 0.0 07/13/2011 0920   BASOSABS 0.1 03/06/2011 1231      Lab 07/14/11 0548 07/13/11 0920 07/13/11 0228 07/13/11 0217  WBC 8.9 11.5* -- 11.7*  HGB 12.0 11.5* 13.3 11.9*  HCT 36.4 35.3* 39.0 36.3  PLT 307 270 -- 291  MCV 85.2 86.3 -- 86.2  MCH 28.1 28.1 -- 28.3  MCHC 33.0 32.6 -- 32.8  RDW 13.6 13.8 -- 13.8  LYMPHSABS -- 0.9 -- 0.7  MONOABS -- 0.7 -- 0.5  EOSABS -- 0.1 -- 0.3  BASOSABS -- 0.0 -- 0.1  BANDABS -- -- -- --    Lab 07/15/11 0445 07/14/11 0548 07/13/11 0920 07/13/11 0228  NA 134* 137 137 137  K 3.9 4.2 5.4* 5.1  CL 96 101 105 111  CO2 23 23 18* --  GLUCOSE 124* 120* 101* 285*  BUN 34* 33* 33* 32*  CREATININE 1.92* 1.84* 1.76* 1.80*  CALCIUM 10.0 10.3 9.5 --  MG -- 1.8 -- --    Lab 07/15/11 0445 07/14/11  1610 07/13/11 0219  INR 2.20* 1.76* 1.44  PROTIME -- -- --   Cardiac markers:  Lab 07/14/11 0548 07/13/11 2141 07/13/11 1340  CKMB 6.4* 7.1* 8.4*  TROPONINI 1.06* 1.30* 1.71*  MYOGLOBIN -- -- --    Recent Results (from the past 240 hour(s))  URINE CULTURE     Status: Normal   Collection Time   07/13/11  2:34 AM      Component Value Range Status Comment   Specimen Description URINE, CATHETERIZED   Final    Special Requests ADDED 07/13/11 0337   Final    Culture  Setup Time [id number]   Final    Colony Count >=100,000 COLONIES/ML   Final    Culture ESCHERICHIA COLI   Final    Report Status 07/14/2011 FINAL   Final    Organism ID, Bacteria ESCHERICHIA COLI   Final   MRSA PCR SCREENING     Status: Normal   Collection Time   07/13/11  5:24 AM      Component Value Range Status Comment   MRSA by PCR NEGATIVE  NEGATIVE  Final     Studies/Results: Dg Chest Port 1 View 07/14/2011   IMPRESSION: No convincing pulmonary edema.  Slight improved  aeration.  Mild basilar atelectasis.  Linear atelectasis or infiltrate in lingula. Cardiomegaly again noted.  Original Report Authenticated By: Natasha Mead, M.D.    Medications: Scheduled Meds:   . anastrozole  1 mg Oral Daily  . aspirin  81 mg Oral Daily  . brimonidine  1 drop Both Eyes Daily  . carvedilol  3.125 mg Oral BID WC  . furosemide  20 mg Oral Daily  . insulin aspart  0-5 Units Subcutaneous QHS  . insulin aspart  0-9 Units Subcutaneous TID WC  . isosorbide-hydrALAZINE  1 tablet Oral BID  . levofloxacin  750 mg Oral Q48H  . levothyroxine  137 mcg Oral Daily  . pantoprazole  40 mg Oral Q1200  . pneumococcal 23 valent vaccine  0.5 mL Intramuscular Tomorrow-1000  . sodium chloride  3 mL Intravenous Q12H  . timolol  1 drop Both Eyes BID  . vitamin D (CHOLECALCIFEROL)  400 Units Oral Daily  . DISCONTD: enoxaparin (LOVENOX) injection  100 mg Subcutaneous Q12H  . DISCONTD: furosemide  40 mg Intravenous Q12H  . DISCONTD: levofloxacin (LEVAQUIN) IV  750 mg Intravenous Q48H  . DISCONTD: metolazone  2.5 mg Oral Q12H   Continuous Infusions:  PRN Meds:.sodium chloride, acetaminophen, clorazepate, methocarbamol, ondansetron (ZOFRAN) IV, prednisoLONE acetate, sodium chloride, zolpidem  Assessment/Plan:  Principal Problem:  *Flash pulmonary edema - not evident on CXR and now clinically improving with no signs of congestion on physical exam - cardiology following and plans to proceed with Cath once INR < 1.7 - will give one Lasix PO 20 mg today - BMP in AM  Active Problems:  Chronic kidney disease (CKD), stage IV (severe) - will follow up on BMP in AM - give only one Lasix 20 mg PO    SOB (shortness of breath) - likely multifactorial in nature and perhaps secondary to chronic systolic/diastolic heart failure - pt denies SOB at the time of the examination and has been maintaining saturations > 92%   DIABETES MELLITUS, TYPE II - continue insulin as noted above   Essential  hypertension, benign - uncontrolled - we can certainly increase Coreg but will reassess in AM and make final decision if additional coverage needed   Lower extremity edema - only trace edema evident  Atrial fibrillation - in sinus rhythm this AM - rate controlled - continue Coumadin   Leukocytosis - resolved   UTI (lower urinary tract infection) - switch to Cipro for 2 additional days - less interference with Coumadin metabolism   DEEP VENOUS THROMBOPHLEBITIS, LEG, LEFT - continue Coumadin   Cardiomyopathy secondary - cardiology following - cath once INR < 1.7   EDUCATION - test results and diagnostic studies were discussed with patient - patient verbalized the understanding - questions were answered at the bedside and contact information was provided for additional questions or concerns   LOS: 2 days   MAGICK-Karim Aiello 07/15/2011, 8:52 AM  [facility] Pager: [phone]

## 2011-07-15 NOTE — Progress Notes (Signed)
ANTICOAGULATION CONSULT NOTE -  Pharmacy Consult for Lovenox/Coumadin Indication: Afib  Allergies  Allergen Reactions  . Atorvastatin Other (See Comments)    Muscle aches  . Colesevelam Other (See Comments)    unknown   Patient Measurements: Height: 5' 4.96" (165 cm) Weight: 219 lb 6.4 oz (99.519 kg) IBW/kg (Calculated) : 56.91   Vital Signs: Temp: 98.1 F (36.7 C) (02/20 0510) Temp src: Oral (02/20 0510) BP: 174/72 mmHg (02/20 0510) Pulse Rate: 60  (02/20 0510)  Labs:  Wendy Cole 07/15/11 0445 07/14/11 0548 07/13/11 2141 07/13/11 1340 07/13/11 0920 07/13/11 0228 07/13/11 0219 07/13/11 0217  HGB -- 12.0 -- -- 11.5* -- -- --  HCT -- 36.4 -- -- 35.3* 39.0 -- --  PLT -- 307 -- -- 270 -- -- 291  APTT -- -- -- -- -- -- -- --  LABPROT 24.8* 20.8* -- -- -- -- 17.8* --  INR 2.20* 1.76* -- -- -- -- 1.44 --  HEPARINUNFRC -- -- -- -- -- -- -- --  CREATININE 1.92* 1.84* -- -- 1.76* -- -- --  CKTOTAL -- 60 73 77 -- -- -- --  CKMB -- 6.4* 7.1* 8.4* -- -- -- --  TROPONINI -- 1.06* 1.30* 1.71* -- -- -- --    Assessment: 76yo female on Coumadin PTA for L LE DVTmanaged by hem/onc.  Coumadin is on hold for cath when INR < 1.7. Full dose LMWH stopped today 2nd INR 2.2.  She is on day 3/5 of levaquin for E Coli UTI which was switched to cipro today to finish 5 day course.  No bleeding reported.  Goal of Therapy:  Hep level 0.3 - 0.7   Plan: Full dose lovenox stopped - last dose 2/19 at 2036. Coumadin on hold for cath when INR < 1.7.  Daily INR and start heparin for bridge therapy when INR < 2.  Herby Abraham  PharmD 07/15/2011,9:27 AM

## 2011-07-15 NOTE — Progress Notes (Signed)
SUBJECTIVE: No new complaints. Breathing is better.   BP 174/72  Pulse 60  Temp(Src) 98.1 F (36.7 C) (Oral)  Resp 16  Ht 5' 4.96" (1.65 m)  Wt 219 lb 6.4 oz (99.519 kg)  BMI 36.55 kg/m2  SpO2 96%  Intake/Output Summary (Last 24 hours) at 07/15/11 4098 Last data filed at 07/15/11 1191  Gross per 24 hour  Intake    300 ml  Output   1950 ml  Net  -1650 ml    PHYSICAL EXAM General: Well developed, well nourished, in no acute distress. Alert and oriented x 3.  Psych:  Good affect, responds appropriately Neck: No JVD. No masses noted.  Lungs: Clear bilaterally with no wheezes or rhonci noted.  Heart: RRR with no murmurs noted. Abdomen: Bowel sounds are present. Soft, non-tender.  Extremities: No lower extremity edema.   LABS: Basic Metabolic Panel:  Basename 07/15/11 0445 07/14/11 0548  NA 134* 137  K 3.9 4.2  CL 96 101  CO2 23 23  GLUCOSE 124* 120*  BUN 34* 33*  CREATININE 1.92* 1.84*  CALCIUM 10.0 10.3  MG -- 1.8  PHOS -- 4.3   CBC:  Basename 07/14/11 0548 07/13/11 0920 07/13/11 0217  WBC 8.9 11.5* --  NEUTROABS -- 9.8* 10.1*  HGB 12.0 11.5* --  HCT 36.4 35.3* --  MCV 85.2 86.3 --  PLT 307 270 --   Cardiac Enzymes:  Basename 07/14/11 0548 07/13/11 2141 07/13/11 1340  CKTOTAL 60 73 77  CKMB 6.4* 7.1* 8.4*  CKMBINDEX -- -- --  TROPONINI 1.06* 1.30* 1.71*   Current Meds:    . anastrozole  1 mg Oral Daily  . aspirin  81 mg Oral Daily  . brimonidine  1 drop Both Eyes Daily  . carvedilol  3.125 mg Oral BID WC  . enoxaparin (LOVENOX) injection  100 mg Subcutaneous Q12H  . insulin aspart  0-5 Units Subcutaneous QHS  . insulin aspart  0-9 Units Subcutaneous TID WC  . isosorbide-hydrALAZINE  1 tablet Oral BID  . levofloxacin  750 mg Oral Q48H  . levothyroxine  137 mcg Oral Daily  . pantoprazole  40 mg Oral Q1200  . pneumococcal 23 valent vaccine  0.5 mL Intramuscular Tomorrow-1000  . sodium chloride  3 mL Intravenous Q12H  . timolol  1 drop Both  Eyes BID  . vitamin D (CHOLECALCIFEROL)  400 Units Oral Daily  . DISCONTD: furosemide  40 mg Intravenous Q12H  . DISCONTD: levofloxacin (LEVAQUIN) IV  750 mg Intravenous Q48H  . DISCONTD: metolazone  2.5 mg Oral Q12H   Echo: 07/13/11:  Left ventricle: Systolic function was severely reduced. The estimated ejection fraction was in the range of 25% to 30%. Features are consistent with a pseudonormal left ventricular filling pattern, with concomitant abnormal relaxation and increased filling pressure (grade 2 diastolic dysfunction). Doppler parameters are consistent with elevated mean left atrial filling pressure. - Regional wall motion abnormality: Akinesis of the basal-mid anteroseptal myocardium; hypokinesis of the entire anterior, mid inferolateral, basal-mid anterolateral, and apical lateral myocardium. - Aortic valve: Mild regurgitation. - Mitral valve: Mild regurgitation. - Left atrium: The atrium was moderately dilated. - Right atrium: The atrium was mildly dilated. - Pulmonary arteries: PA peak pressure: 49mm Hg (S). Impressions:  - The right ventricular systolic pressure was increased consistent with mild pulmonary hypertension.    ASSESSMENT AND PLAN:  1. Acute on chronic systolic CHF: She is diuresing well. IV Lasix stopped yesterday. Would resume at least 20mg  po  Lasix today. Renal function overall stable with diuresis.   2. Cardiomyopathy: Unknown etiology. This is worrisome for ischemic CM with elevated troponin. She has no documented CAD. She will need right and left heart cath when INR is less than 1.7. Continue holding coumadin. Would d/c Lovenox as INR is 2.2. When INR is less than 2.0, would use heparin drip instead of Lovenox as this will be better managed for her cath.   3. NSTEMI: as above. Will manage medically now and will need cath when INR less than 1.7 if creatinine stable.   4. CRI: per primary team. Stable with diuresis.      Wendy Cole  2/20/20138:14 AM

## 2011-07-16 LAB — BASIC METABOLIC PANEL
BUN: 47 mg/dL — ABNORMAL HIGH (ref 6–23)
Calcium: 10.4 mg/dL (ref 8.4–10.5)
Creatinine, Ser: 2.51 mg/dL — ABNORMAL HIGH (ref 0.50–1.10)
GFR calc Af Amer: 21 mL/min — ABNORMAL LOW (ref >90)
GFR calc non Af Amer: 18 mL/min — ABNORMAL LOW (ref >90)
Glucose, Bld: 110 mg/dL — ABNORMAL HIGH (ref 70–99)

## 2011-07-16 LAB — CBC
HCT: 38.8 % (ref 36.0–46.0)
Hemoglobin: 12.5 g/dL (ref 12.0–15.0)
MCH: 27.2 pg (ref 26.0–34.0)
MCHC: 32.2 g/dL (ref 30.0–36.0)
MCV: 84.3 fL (ref 78.0–100.0)
Platelets: 376 K/uL (ref 150–400)
RBC: 4.6 MIL/uL (ref 3.87–5.11)
RDW: 13.4 % (ref 11.5–15.5)
WBC: 8.1 K/uL (ref 4.0–10.5)

## 2011-07-16 LAB — GLUCOSE, CAPILLARY
Glucose-Capillary: 107 mg/dL — ABNORMAL HIGH (ref 70–99)
Glucose-Capillary: 119 mg/dL — ABNORMAL HIGH (ref 70–99)

## 2011-07-16 LAB — PROTIME-INR: INR: 1.85 — ABNORMAL HIGH (ref 0.00–1.49)

## 2011-07-16 MED ORDER — HEPARIN SOD (PORCINE) IN D5W 100 UNIT/ML IV SOLN
1350.0000 [IU]/h | INTRAVENOUS | Status: DC
  Administered 2011-07-17 – 2011-07-19 (×4): 1500 [IU]/h via INTRAVENOUS
  Filled 2011-07-16 (×7): qty 250

## 2011-07-16 MED ORDER — SODIUM CHLORIDE 0.9 % IV SOLN: INTRAVENOUS | Status: AC

## 2011-07-16 MED ORDER — HEPARIN SOD (PORCINE) IN D5W 100 UNIT/ML IV SOLN
1300.0000 [IU]/h | INTRAVENOUS | Status: DC
  Administered 2011-07-16: 1300 [IU]/h via INTRAVENOUS
  Filled 2011-07-16 (×2): qty 250

## 2011-07-16 NOTE — Progress Notes (Signed)
SUBJECTIVE: Mild SOB. No chest pain.   BP 155/74  Pulse 67  Temp(Src) 98.5 F (36.9 C) (Oral)  Resp 18  Ht 5' 4.96" (1.65 m)  Wt 219 lb 1.6 oz (99.383 kg)  BMI 36.50 kg/m2  SpO2 95%  Intake/Output Summary (Last 24 hours) at 07/16/11 1610 Last data filed at 07/15/11 1700  Gross per 24 hour  Intake    240 ml  Output      0 ml  Net    240 ml    PHYSICAL EXAM General: Well developed, well nourished, in no acute distress. Alert and oriented x 3.  Psych:  Good affect, responds appropriately Neck: No JVD. No masses noted.  Lungs: Clear bilaterally with no wheezes or rhonci noted.  Heart: RRR with no murmurs noted. Abdomen: Bowel sounds are present. Soft, non-tender.  Extremities: No lower extremity edema.    LABS: Basic Metabolic Panel:  Basename 07/16/11 0510 07/15/11 1257 07/15/11 0445 07/14/11 0548  NA 130* -- 134* --  K 4.1 -- 3.9 --  CL 93* -- 96 --  CO2 21 -- 23 --  GLUCOSE 110* -- 124* --  BUN 47* -- 34* --  CREATININE 2.51* -- 1.92* --  CALCIUM 10.4 -- 10.0 --  MG -- 1.8 -- 1.8  PHOS -- -- -- 4.3   CBC:  Basename 07/16/11 0510 07/14/11 0548 07/13/11 0920  WBC 8.1 8.9 --  NEUTROABS -- -- 9.8*  HGB 12.5 12.0 --  HCT 38.8 36.4 --  MCV 84.3 85.2 --  PLT 376 307 --   Cardiac Enzymes:  Basename 07/14/11 0548 07/13/11 2141 07/13/11 1340  CKTOTAL 60 73 77  CKMB 6.4* 7.1* 8.4*  CKMBINDEX -- -- --  TROPONINI 1.06* 1.30* 1.71*   Fasting Lipid Panel: No results found for this basename: CHOL,HDL,LDLCALC,TRIG,CHOLHDL,LDLDIRECT in the last 72 hours  Current Meds:    . anastrozole  1 mg Oral Daily  . aspirin  81 mg Oral Daily  . brimonidine  1 drop Both Eyes BID  . carvedilol  6.25 mg Oral BID WC  . ciprofloxacin  500 mg Oral Q breakfast  . furosemide  20 mg Oral Daily  . insulin aspart  0-5 Units Subcutaneous QHS  . insulin aspart  0-9 Units Subcutaneous TID WC  . isosorbide-hydrALAZINE  1 tablet Oral BID  . levothyroxine  137 mcg Oral Daily  .  pantoprazole  40 mg Oral Q1200  . pneumococcal 23 valent vaccine  0.5 mL Intramuscular Tomorrow-1000  . sodium chloride  3 mL Intravenous Q12H  . timolol  1 drop Both Eyes BID  . vitamin D (CHOLECALCIFEROL)  400 Units Oral Daily  . DISCONTD: brimonidine  1 drop Both Eyes Daily  . DISCONTD: sulfamethoxazole-trimethoprim  1 tablet Oral Q12H     ASSESSMENT AND PLAN:  1. Acute on chronic systolic CHF: Volume status is better. Now on low dose po Lasix.  Renal function has worsened. Would d/c Lasix.   2. Cardiomyopathy: Unknown etiology. This is worrisome for ischemic CM with elevated troponin. She has no documented CAD. She will need right and left heart cath when INR is less than 1.7. Continue holding coumadin. INR is 1.85 this am. Would start heparin drip this am.    3. NSTEMI: as above. Will manage medically now and will need cath when INR less than 1.7 if creatinine stable. Will reassess in the am. Hopefully we can plan the cath tomorrow but this will depend upon renal function and INR.  4. CRI: per primary team. Renal function worsened today. Would d/c Lasix.      Sidi Dzikowski  2/21/20137:06 AM

## 2011-07-16 NOTE — Progress Notes (Signed)
ANTICOAGULATION CONSULT NOTE - Follow Up Consult  Pharmacy Consult for heparin Indication: atrial fibrillation  Labs:  Basename 07/16/11 1548 07/16/11 0510 07/15/11 0445 07/14/11 0548 07/13/11 2141  HGB -- 12.5 -- 12.0 --  HCT -- 38.8 -- 36.4 --  PLT -- 376 -- 307 --  APTT -- -- -- -- --  LABPROT -- 21.7* 24.8* 20.8* --  INR -- 1.85* 2.20* 1.76* --  HEPARINUNFRC 0.16* -- -- -- --  CREATININE -- 2.51* 1.92* 1.84* --  CKTOTAL -- -- -- 60 73  CKMB -- -- -- 6.4* 7.1*  TROPONINI -- -- -- 1.06* 1.30*   Assessment: 76yo female now with INR<2, Coumadin on hold for pending cardiac cath.  Heparin drip started this morning at 1300 units/hr.  Heparin level subtherapeutic on this rate.  No bleeding noted per chart notes, RN reports no issues with IV or infusion, but infusion was started about 2.5 hrs late this morning, so level may be a little artificially low.  Goal of Therapy:  Heparin level 0.3-0.7 units/ml   Plan:  1. Increase IV heparin to 1500 units/hr.   2. Check heparin level in 8 hrs. 3. Daily heparin level and CBC.  Gardner Candle PharmD BCPS 07/16/2011,4:24 PM

## 2011-07-16 NOTE — Progress Notes (Signed)
[name], female   DOB: 1936-03-02, 76 y.o.   MRN: [id number]  Subjective: No events overnight. Patient denies chest pain, shortness of breath, abdominal pain.   Objective:  Vital signs in last 24 hours:  Filed Vitals:   07/15/11 2150 07/16/11 0636 07/16/11 0900 07/16/11 1423  BP: 163/78 155/74 154/76 103/60  Pulse: 65 67 74 70  Temp: 97.5 F (36.4 C) 98.5 F (36.9 C) 97.5 F (36.4 C) 98.2 F (36.8 C)  TempSrc: Oral Oral Oral Oral  Resp: 18 18 16 18   Height:      Weight:  99.383 kg (219 lb 1.6 oz)    SpO2: 95% 95% 98% 95%    Intake/Output from previous day:   Intake/Output Summary (Last 24 hours) at 07/16/11 1603 Last data filed at 07/16/11 1400  Gross per 24 hour  Intake    480 ml  Output      1 ml  Net    479 ml    Physical Exam: General: Alert, awake, oriented x3, in no acute distress. HEENT: No bruits, no goiter. Moist mucous membranes, no scleral icterus, no conjunctival pallor. Heart: Regular rate and rhythm, S1/S2 +, no murmurs, rubs, gallops. Lungs: Clear to auscultation bilaterally. No wheezing, no rhonchi, no rales.  Abdomen: Soft, nontender, nondistended, positive bowel sounds. Extremities: No clubbing or cyanosis, no pitting edema,  positive pedal pulses. Neuro: Grossly nonfocal.  Lab Results:  Basic Metabolic Panel:    Component Value Date/Time   NA 130* 07/16/2011 0510   K 4.1 07/16/2011 0510   CL 93* 07/16/2011 0510   CO2 21 07/16/2011 0510   BUN 47* 07/16/2011 0510   CREATININE 2.51* 07/16/2011 0510   GLUCOSE 110* 07/16/2011 0510   GLUCOSE 81 04/09/2006 1716   CALCIUM 10.4 07/16/2011 0510   CALCIUM 9.6 09/27/2006 2054   CBC:    Component Value Date/Time   WBC 8.1 07/16/2011 0510   WBC 6.1 03/06/2011 1231   HGB 12.5 07/16/2011 0510   HGB 11.4* 03/06/2011 1231   HCT 38.8 07/16/2011 0510   HCT 34.9 03/06/2011 1231   PLT 376 07/16/2011 0510   PLT 240 03/06/2011 1231   MCV 84.3 07/16/2011 0510   MCV 89.3 03/06/2011 1231   NEUTROABS 9.8* 07/13/2011 0920   NEUTROABS 4.5 03/06/2011 1231   LYMPHSABS 0.9 07/13/2011 0920   LYMPHSABS 0.8* 03/06/2011 1231   MONOABS 0.7 07/13/2011 0920   MONOABS 0.5 03/06/2011 1231   EOSABS 0.1 07/13/2011 0920   EOSABS 0.3 03/06/2011 1231   BASOSABS 0.0 07/13/2011 0920   BASOSABS 0.1 03/06/2011 1231      Lab 07/16/11 0510 07/14/11 0548 07/13/11 0920 07/13/11 0228 07/13/11 0217  WBC 8.1 8.9 11.5* -- 11.7*  HGB 12.5 12.0 11.5* 13.3 11.9*  HCT 38.8 36.4 35.3* 39.0 36.3  PLT 376 307 270 -- 291  MCV 84.3 85.2 86.3 -- 86.2  MCH 27.2 28.1 28.1 -- 28.3  MCHC 32.2 33.0 32.6 -- 32.8  RDW 13.4 13.6 13.8 -- 13.8  LYMPHSABS -- -- 0.9 -- 0.7  MONOABS -- -- 0.7 -- 0.5  EOSABS -- -- 0.1 -- 0.3  BASOSABS -- -- 0.0 -- 0.1  BANDABS -- -- -- -- --    Lab 07/16/11 0510 07/15/11 1257 07/15/11 0445 07/14/11 0548 07/13/11 0920 07/13/11 0228  NA 130* -- 134* 137 137 137  K 4.1 -- 3.9 4.2 5.4* 5.1  CL 93* -- 96 101 105 111  CO2 21 -- 23 23 18* --  GLUCOSE 110* -- 124* 120* 101* 285*  BUN 47* -- 34* 33* 33* 32*  CREATININE 2.51* -- 1.92* 1.84* 1.76* 1.80*  CALCIUM 10.4 -- 10.0 10.3 9.5 --  MG -- 1.8 -- 1.8 -- --    Lab 07/16/11 0510 07/15/11 0445 07/14/11 0548 07/13/11 0219  INR 1.85* 2.20* 1.76* 1.44  PROTIME -- -- -- --   Cardiac markers:  Lab 07/14/11 0548 07/13/11 2141 07/13/11 1340  CKMB 6.4* 7.1* 8.4*  TROPONINI 1.06* 1.30* 1.71*  MYOGLOBIN -- -- --    Recent Results (from the past 240 hour(s))  URINE CULTURE     Status: Normal   Collection Time   07/13/11  2:34 AM      Component Value Range Status Comment   Specimen Description URINE, CATHETERIZED   Final    Special Requests ADDED 07/13/11 0337   Final    Culture  Setup Time [id number]   Final    Colony Count >=100,000 COLONIES/ML   Final    Culture ESCHERICHIA COLI   Final    Report Status 07/14/2011 FINAL   Final    Organism ID, Bacteria ESCHERICHIA COLI   Final   MRSA PCR SCREENING     Status: Normal   Collection  Time   07/13/11  5:24 AM      Component Value Range Status Comment   MRSA by PCR NEGATIVE  NEGATIVE  Final     Studies/Results: No results found.  Medications: Scheduled Meds:   . anastrozole  1 mg Oral Daily  . aspirin  81 mg Oral Daily  . brimonidine  1 drop Both Eyes BID  . carvedilol  6.25 mg Oral BID WC  . ciprofloxacin  500 mg Oral Q breakfast  . insulin aspart  0-5 Units Subcutaneous QHS  . insulin aspart  0-9 Units Subcutaneous TID WC  . isosorbide-hydrALAZINE  1 tablet Oral BID  . levothyroxine  137 mcg Oral Daily  . pantoprazole  40 mg Oral Q1200  . sodium chloride  3 mL Intravenous Q12H  . timolol  1 drop Both Eyes BID  . vitamin D (CHOLECALCIFEROL)  400 Units Oral Daily  . DISCONTD: brimonidine  1 drop Both Eyes Daily  . DISCONTD: furosemide  20 mg Oral Daily   Continuous Infusions:   . heparin 1,300 Units/hr (07/16/11 0949)   PRN Meds:.sodium chloride, acetaminophen, clorazepate, methocarbamol, ondansetron (ZOFRAN) IV, prednisoLONE acetate, sodium chloride, zolpidem  Assessment/Plan:  Principal Problem:  *Flash pulmonary edema  - not evident on CXR and now clinically improving with no signs of congestion on physical exam  - cardiology following and plans to proceed with Cath once INR < 1.7  - will give one Lasix PO 20 mg today  - BMP in AM   Active Problems:  Chronic kidney disease (CKD), stage IV (severe)  - will follow up on BMP in AM  - d/c Lasix - provide only gentle hydration and no more than 500 cc NS  SOB (shortness of breath)  - likely multifactorial in nature and perhaps secondary to chronic systolic/diastolic heart failure  - pt denies SOB at the time of the examination and has been maintaining saturations > 92%   DIABETES MELLITUS, TYPE II  - continue insulin as noted above   Essential hypertension, benign  - uncontrolled  - we can certainly increase Coreg but will reassess in AM and make final decision if additional coverage needed    Lower extremity edema  - only trace edema evident  Atrial fibrillation  - in sinus rhythm this AM  - rate controlled  - hold  Coumadin in an anticipation of Cath in AM  Leukocytosis  - resolved   UTI (lower urinary tract infection)  - switch to Cipro for 2 additional days  - less interference with Coumadin metabolism   DEEP VENOUS THROMBOPHLEBITIS, LEG, LEFT  - hold Coumadin  Cardiomyopathy secondary  - cardiology following  - cath once INR < 1.7   EDUCATION  - test results and diagnostic studies were discussed with patient  - patient verbalized the understanding   LOS: 3 days   MAGICK-Jeanice Dempsey 07/16/2011, 4:03 PM  [facility] Pager: [phone]

## 2011-07-16 NOTE — Progress Notes (Signed)
ANTICOAGULATION CONSULT NOTE - Follow Up Consult  Pharmacy Consult for heparin Indication: atrial fibrillation  Labs:  Basename 07/16/11 0510 07/15/11 0445 07/14/11 0548 07/13/11 2141 07/13/11 1340 07/13/11 0920  HGB -- -- 12.0 -- -- 11.5*  HCT -- -- 36.4 -- -- 35.3*  PLT -- -- 307 -- -- 270  APTT -- -- -- -- -- --  LABPROT 21.7* 24.8* 20.8* -- -- --  INR 1.85* 2.20* 1.76* -- -- --  HEPARINUNFRC -- -- -- -- -- --  CREATININE -- 1.92* 1.84* -- -- 1.76*  CKTOTAL -- -- 60 73 77 --  CKMB -- -- 6.4* 7.1* 8.4* --  TROPONINI -- -- 1.06* 1.30* 1.71* --   Assessment: 75yo female now with INR<2, Coumadin on hold for pending cardiac cath, now to begin heparin for Afib.  Goal of Therapy:  Heparin level 0.3-0.7 units/ml   Plan:  Will begin heparin gtt at 1300 units/hr and check level in 8hr.  Colleen Can PharmD BCPS 07/16/2011,7:05 AM

## 2011-07-17 LAB — PROTIME-INR: Prothrombin Time: 19.4 seconds — ABNORMAL HIGH (ref 11.6–15.2)

## 2011-07-17 LAB — GLUCOSE, CAPILLARY
Glucose-Capillary: 94 mg/dL (ref 70–99)
Glucose-Capillary: 106 mg/dL — ABNORMAL HIGH (ref 70–99)

## 2011-07-17 LAB — CBC
MCH: 27.3 pg (ref 26.0–34.0)
Platelets: 344 10*3/uL (ref 150–400)
RBC: 4.54 MIL/uL (ref 3.87–5.11)
RDW: 13.5 % (ref 11.5–15.5)
WBC: 6.7 10*3/uL (ref 4.0–10.5)

## 2011-07-17 LAB — BASIC METABOLIC PANEL
Calcium: 9.7 mg/dL (ref 8.4–10.5)
Creatinine, Ser: 2.52 mg/dL — ABNORMAL HIGH (ref 0.50–1.10)
GFR calc non Af Amer: 18 mL/min — ABNORMAL LOW (ref >90)
Glucose, Bld: 120 mg/dL — ABNORMAL HIGH (ref 70–99)
Sodium: 129 mEq/L — ABNORMAL LOW (ref 135–145)

## 2011-07-17 LAB — HEPARIN LEVEL (UNFRACTIONATED)
Heparin Unfractionated: 0.45 IU/mL (ref 0.30–0.70)
Heparin Unfractionated: 0.41 IU/mL (ref 0.30–0.70)

## 2011-07-17 NOTE — Progress Notes (Signed)
Utilization Review Completed.Kinney Sackmann T2/22/2013   

## 2011-07-17 NOTE — Progress Notes (Signed)
ANTICOAGULATION CONSULT NOTE - Follow Up Consult  Pharmacy Consult for heparin Indication: atrial fibrillation  Labs:  Basename 07/17/11 0050 07/16/11 1548 07/16/11 0510 07/15/11 0445 07/14/11 0548  HGB -- -- 12.5 -- 12.0  HCT -- -- 38.8 -- 36.4  PLT -- -- 376 -- 307  APTT -- -- -- -- --  LABPROT -- -- 21.7* 24.8* 20.8*  INR -- -- 1.85* 2.20* 1.76*  HEPARINUNFRC 0.45 0.16* -- -- --  CREATININE -- -- 2.51* 1.92* 1.84*  CKTOTAL -- -- -- -- 60  CKMB -- -- -- -- 6.4*  TROPONINI -- -- -- -- 1.06*   Assessment: 76yo female now with INR<2, Coumadin on hold for pending cardiac cath. Heparin level (0.45) is at-goal on 1500 units/hr.   Goal of Therapy:  Heparin level 0.3-0.7 units/ml   Plan:  1. Continue IV heparin at 1500 units/hr.   2. Daily heparin level and CBC.  Emeline Gins  07/17/2011,1:42 AM

## 2011-07-17 NOTE — Progress Notes (Signed)
ANTICOAGULATION CONSULT NOTE - Follow Up Consult  Pharmacy Consult for heparin Indication: atrial fibrillation  Labs:  Basename 07/17/11 0550 07/17/11 0050 07/16/11 1548 07/16/11 0510 07/15/11 0445  HGB 12.4 -- -- 12.5 --  HCT 37.5 -- -- 38.8 --  PLT 344 -- -- 376 --  APTT -- -- -- -- --  LABPROT 19.4* -- -- 21.7* 24.8*  INR 1.61* -- -- 1.85* 2.20*  HEPARINUNFRC 0.41 0.45 0.16* -- --  CREATININE 2.52* -- -- 2.51* 1.92*  CKTOTAL -- -- -- -- --  CKMB -- -- -- -- --  TROPONINI -- -- -- -- --   Assessment: 76yo female now with INR<2, Coumadin on hold for pending cardiac cath. Heparin level (0.41) is at-goal on 1500 units/hr.  CBC stable.  No bleeding reported.  Goal of Therapy:  Heparin level 0.3-0.7 units/ml   Plan:  1. Continue IV heparin at 1500 units/hr.   2. Daily heparin level and CBC.  Len Childs T  07/17/2011,12:36 PM

## 2011-07-17 NOTE — Progress Notes (Signed)
SUBJECTIVE: Legs aching. No chest pain, SOB. No events.   BP 159/75  Pulse 71  Temp(Src) 98.2 F (36.8 C) (Oral)  Resp 18  Ht 5' 4.96" (1.65 m)  Wt 220 lb 9.6 oz (100.064 kg)  BMI 36.75 kg/m2  SpO2 93%  Intake/Output Summary (Last 24 hours) at 07/17/11 0732 Last data filed at 07/16/11 2100  Gross per 24 hour  Intake    480 ml  Output      1 ml  Net    479 ml    PHYSICAL EXAM General: Well developed, well nourished, in no acute distress. Alert and oriented x 3.  Psych:  Good affect, responds appropriately Neck: No JVD. No masses noted.  Lungs: Clear bilaterally with no wheezes or rhonci noted.  Heart: RRR with no murmurs noted. Abdomen: Bowel sounds are present. Soft, non-tender.  Extremities: No lower extremity edema.   LABS: BMET    Component Value Date/Time   NA 129* 07/17/2011 0550   K 3.7 07/17/2011 0550   CL 93* 07/17/2011 0550   CO2 19 07/17/2011 0550   GLUCOSE 120* 07/17/2011 0550   GLUCOSE 81 04/09/2006 1716   BUN 53* 07/17/2011 0550   CREATININE 2.52* 07/17/2011 0550   CALCIUM 9.7 07/17/2011 0550   CALCIUM 9.6 09/27/2006 2054   GFRNONAA 18* 07/17/2011 0550   GFRAA 21* 07/17/2011 0550    CBC:  Basename 07/17/11 0550 07/16/11 0510  WBC 6.7 8.1  NEUTROABS -- --  HGB 12.4 12.5  HCT 37.5 38.8  MCV 82.6 84.3  PLT 344 376   INR:1.6  Current Meds:    . anastrozole  1 mg Oral Daily  . aspirin  81 mg Oral Daily  . brimonidine  1 drop Both Eyes BID  . carvedilol  6.25 mg Oral BID WC  . ciprofloxacin  500 mg Oral Q breakfast  . insulin aspart  0-5 Units Subcutaneous QHS  . insulin aspart  0-9 Units Subcutaneous TID WC  . isosorbide-hydrALAZINE  1 tablet Oral BID  . levothyroxine  137 mcg Oral Daily  . pantoprazole  40 mg Oral Q1200  . sodium chloride  3 mL Intravenous Q12H  . timolol  1 drop Both Eyes BID  . vitamin D (CHOLECALCIFEROL)  400 Units Oral Daily  . DISCONTD: furosemide  20 mg Oral Daily     ASSESSMENT AND PLAN:  1. Acute on chronic  systolic CHF: Volume status is better. Lasix is being held secondary to renal insufficiency with acute worsening with diuresis.    2. Cardiomyopathy: Unknown etiology. This is worrisome for ischemic CM with elevated troponin. She has no documented CAD. She will need right and left heart cath. This has been delayed given elevated INR and renal insufficiency. Her INR is now down below 1.7 but she had acute worsening of her renal function yesterday and her creatinine is still 2.5 today with GFR 18. Continue holding coumadin. Would continue heparin drip for now.   3. NSTEMI: as above. Will manage medically now and will need cath when ok from perspective of her renal function. I would suspect that if we hold her Lasix over the weekend, her renal function may improve to the point that we can consider cath next week. If her renal function does not improve over the weekend, we can plan a stress myoview to assess for ischemia and avoid cath.   4. CRI: per primary team. Holding Lasix.   5. Hyponatremia: Per primary team.  Wendy Cole  2/22/20137:32 AM

## 2011-07-17 NOTE — Progress Notes (Signed)
[name], female   DOB: 17-Jan-1936, 76 y.o.   MRN: [id number]  Subjective: No events overnight. Patient denies chest pain, shortness of breath, abdominal pain.   Objective:  Vital signs in last 24 hours:  Filed Vitals:   07/17/11 0413 07/17/11 0416 07/17/11 1308 07/17/11 2139  BP:  159/75 120/63 175/72  Pulse:  71 72 63  Temp:  98.2 F (36.8 C) 98 F (36.7 C) 97.8 F (36.6 C)  TempSrc:  Oral  Oral  Resp:  18 18 18   Height:      Weight: 100.064 kg (220 lb 9.6 oz)     SpO2:  93% 93% 96%    Intake/Output from previous day:   Intake/Output Summary (Last 24 hours) at 07/17/11 2235 Last data filed at 07/17/11 2100  Gross per 24 hour  Intake    600 ml  Output      0 ml  Net    600 ml    Physical Exam: General: Alert, awake, oriented x3, in no acute distress. HEENT: No bruits, no goiter. Moist mucous membranes, no scleral icterus, no conjunctival pallor. Heart: Regular rate and rhythm, S1/S2 +, no murmurs, rubs, gallops. Lungs: Clear to auscultation bilaterally. No wheezing, no rhonchi, no rales.  Abdomen: Soft, nontender, nondistended, positive bowel sounds. Extremities: No clubbing or cyanosis, no pitting edema,  positive pedal pulses. Neuro: Grossly nonfocal.  Lab Results:  Basic Metabolic Panel:    Component Value Date/Time   NA 129* 07/17/2011 0550   K 3.7 07/17/2011 0550   CL 93* 07/17/2011 0550   CO2 19 07/17/2011 0550   BUN 53* 07/17/2011 0550   CREATININE 2.52* 07/17/2011 0550   GLUCOSE 120* 07/17/2011 0550   GLUCOSE 81 04/09/2006 1716   CALCIUM 9.7 07/17/2011 0550   CALCIUM 9.6 09/27/2006 2054   CBC:    Component Value Date/Time   WBC 6.7 07/17/2011 0550   WBC 6.1 03/06/2011 1231   HGB 12.4 07/17/2011 0550   HGB 11.4* 03/06/2011 1231   HCT 37.5 07/17/2011 0550   HCT 34.9 03/06/2011 1231   PLT 344 07/17/2011 0550   PLT 240 03/06/2011 1231   MCV 82.6 07/17/2011 0550   MCV 89.3 03/06/2011 1231   NEUTROABS 9.8* 07/13/2011 0920   NEUTROABS  4.5 03/06/2011 1231   LYMPHSABS 0.9 07/13/2011 0920   LYMPHSABS 0.8* 03/06/2011 1231   MONOABS 0.7 07/13/2011 0920   MONOABS 0.5 03/06/2011 1231   EOSABS 0.1 07/13/2011 0920   EOSABS 0.3 03/06/2011 1231   BASOSABS 0.0 07/13/2011 0920   BASOSABS 0.1 03/06/2011 1231      Lab 07/17/11 0550 07/16/11 0510 07/14/11 0548 07/13/11 0920 07/13/11 0228 07/13/11 0217  WBC 6.7 8.1 8.9 11.5* -- 11.7*  HGB 12.4 12.5 12.0 11.5* 13.3 --  HCT 37.5 38.8 36.4 35.3* 39.0 --  PLT 344 376 307 270 -- 291  MCV 82.6 84.3 85.2 86.3 -- 86.2  MCH 27.3 27.2 28.1 28.1 -- 28.3  MCHC 33.1 32.2 33.0 32.6 -- 32.8  RDW 13.5 13.4 13.6 13.8 -- 13.8  LYMPHSABS -- -- -- 0.9 -- 0.7  MONOABS -- -- -- 0.7 -- 0.5  EOSABS -- -- -- 0.1 -- 0.3  BASOSABS -- -- -- 0.0 -- 0.1  BANDABS -- -- -- -- -- --    Lab 07/17/11 0550 07/16/11 0510 07/15/11 1257 07/15/11 0445 07/14/11 0548 07/13/11 0920  NA 129* 130* -- 134* 137 137  K 3.7 4.1 -- 3.9 4.2 5.4*  CL 93* 93* --  96 101 105  CO2 19 21 -- 23 23 18*  GLUCOSE 120* 110* -- 124* 120* 101*  BUN 53* 47* -- 34* 33* 33*  CREATININE 2.52* 2.51* -- 1.92* 1.84* 1.76*  CALCIUM 9.7 10.4 -- 10.0 10.3 9.5  MG -- -- 1.8 -- 1.8 --    Lab 07/17/11 0550 07/16/11 0510 07/15/11 0445 07/14/11 0548 07/13/11 0219  INR 1.61* 1.85* 2.20* 1.76* 1.44  PROTIME -- -- -- -- --   Cardiac markers:  Lab 07/14/11 0548 07/13/11 2141 07/13/11 1340  CKMB 6.4* 7.1* 8.4*  TROPONINI 1.06* 1.30* 1.71*  MYOGLOBIN -- -- --   No components found with this basename: POCBNP:3 Recent Results (from the past 240 hour(s))  URINE CULTURE     Status: Normal   Collection Time   07/13/11  2:34 AM      Component Value Range Status Comment   Specimen Description URINE, CATHETERIZED   Final    Special Requests ADDED 07/13/11 0337   Final    Culture  Setup Time [id number]   Final    Colony Count >=100,000 COLONIES/ML   Final    Culture ESCHERICHIA COLI   Final    Report Status 07/14/2011 FINAL   Final    Organism  ID, Bacteria ESCHERICHIA COLI   Final   MRSA PCR SCREENING     Status: Normal   Collection Time   07/13/11  5:24 AM      Component Value Range Status Comment   MRSA by PCR NEGATIVE  NEGATIVE  Final     Studies/Results: No results found.  Medications: Scheduled Meds:   . anastrozole  1 mg Oral Daily  . aspirin  81 mg Oral Daily  . brimonidine  1 drop Both Eyes BID  . carvedilol  6.25 mg Oral BID WC  . ciprofloxacin  500 mg Oral Q breakfast  . insulin aspart  0-5 Units Subcutaneous QHS  . insulin aspart  0-9 Units Subcutaneous TID WC  . isosorbide-hydrALAZINE  1 tablet Oral BID  . levothyroxine  137 mcg Oral Daily  . pantoprazole  40 mg Oral Q1200  . sodium chloride  3 mL Intravenous Q12H  . timolol  1 drop Both Eyes BID  . vitamin D (CHOLECALCIFEROL)  400 Units Oral Daily   Continuous Infusions:   . sodium chloride 50 mL/hr at 07/16/11 1700  . heparin 1,500 Units/hr (07/17/11 2133)   PRN Meds:.sodium chloride, acetaminophen, clorazepate, methocarbamol, ondansetron (ZOFRAN) IV, prednisoLONE acetate, sodium chloride, zolpidem  Assessment/Plan:  Principal Problem:  *Flash pulmonary edema  - not evident on CXR and now clinically improving with no signs of congestion on physical exam  - cardiology following and plans to proceed with Cath once INR < 1.7 and renal function improves - lasix discontinued, will provide gentle hydration - BMP in AM   Active Problems:  Chronic kidney disease (CKD), stage IV (severe)  - will follow up on BMP in AM  - d/c Lasix  - provide only gentle hydration and no more than 500 cc NS   SOB (shortness of breath)  - likely multifactorial in nature and perhaps secondary to chronic systolic/diastolic heart failure  - pt denies SOB at the time of the examination and has been maintaining saturations > 92%   DIABETES MELLITUS, TYPE II  - continue insulin as noted above   Essential hypertension, benign  - uncontrolled  - increased Coreg but will  reassess in AM and make final decision if additional coverage needed  Lower extremity edema  - only trace edema evident   Atrial fibrillation  - in sinus rhythm this AM  - rate controlled  - hold Coumadin in an anticipation of Cath on Monday 07/20/2011  Leukocytosis  - resolved   UTI (lower urinary tract infection)  - switch to Cipro for 1 additional day - less interference with Coumadin metabolism   DEEP VENOUS THROMBOPHLEBITIS, LEG, LEFT  - hold Coumadin   Cardiomyopathy secondary  - cardiology following  - cath once INR < 1.7 and planned for Monday 07/20/2011  EDUCATION  - test results and diagnostic studies were discussed with patient  - patient verbalized the understanding    LOS: 4 days   MAGICK-Moise Friday 07/17/2011, 10:35 PM  [facility] Pager: [phone]

## 2011-07-18 LAB — HEPARIN LEVEL (UNFRACTIONATED): Heparin Unfractionated: 0.59 IU/mL (ref 0.30–0.70)

## 2011-07-18 LAB — BASIC METABOLIC PANEL
CO2: 21 mEq/L (ref 19–32)
Chloride: 92 mEq/L — ABNORMAL LOW (ref 96–112)
Creatinine, Ser: 2.06 mg/dL — ABNORMAL HIGH (ref 0.50–1.10)
GFR calc Af Amer: 26 mL/min — ABNORMAL LOW (ref >90)
Potassium: 4.1 mEq/L (ref 3.5–5.1)

## 2011-07-18 LAB — GLUCOSE, CAPILLARY
Glucose-Capillary: 127 mg/dL — ABNORMAL HIGH (ref 70–99)
Glucose-Capillary: 126 mg/dL — ABNORMAL HIGH (ref 70–99)
Glucose-Capillary: 132 mg/dL — ABNORMAL HIGH (ref 70–99)

## 2011-07-18 MED ORDER — AMLODIPINE BESYLATE 10 MG PO TABS
10.0000 mg | ORAL_TABLET | Freq: Every day | ORAL | Status: DC
  Administered 2011-07-18 – 2011-07-21 (×4): 10 mg via ORAL
  Filled 2011-07-18 (×4): qty 1

## 2011-07-18 MED ORDER — CARVEDILOL 12.5 MG PO TABS
12.5000 mg | ORAL_TABLET | Freq: Two times a day (BID) | ORAL | Status: DC
  Administered 2011-07-18 – 2011-07-21 (×6): 12.5 mg via ORAL
  Filled 2011-07-18 (×8): qty 1

## 2011-07-18 NOTE — Progress Notes (Signed)
[name], female   DOB: 01-11-1936, 76 y.o.   MRN: [id number]  Subjective: No events overnight. Patient denies chest pain, shortness of breath, abdominal pain.   Objective:  Vital signs in last 24 hours:  Filed Vitals:   07/18/11 0526 07/18/11 0556 07/18/11 0653 07/18/11 1058  BP: 215/81 179/76 189/66 162/57  Pulse: 71 64 86   Temp: 97.9 F (36.6 C) 97.9 F (36.6 C)    TempSrc:  Oral    Resp: 18 20    Height:      Weight: 100.7 kg (222 lb 0.1 oz)     SpO2: 98% 97%      Intake/Output from previous day:   Intake/Output Summary (Last 24 hours) at 07/18/11 1229 Last data filed at 07/18/11 1000  Gross per 24 hour  Intake    840 ml  Output      0 ml  Net    840 ml    Physical Exam: General: Alert, awake, oriented x3, in no acute distress. HEENT: No bruits, no goiter. Moist mucous membranes, no scleral icterus, no conjunctival pallor. Heart: Regular rate and rhythm, S1/S2 +, no murmurs, rubs, gallops. Lungs: Clear to auscultation bilaterally. No wheezing, no rhonchi, no rales.  Abdomen: Soft, nontender, nondistended, positive bowel sounds. Extremities: No clubbing or cyanosis, no pitting edema,  positive pedal pulses. Neuro: Grossly nonfocal.  Lab Results:  Basic Metabolic Panel:    Component Value Date/Time   NA 127* 07/18/2011 0755   K 4.1 07/18/2011 0755   CL 92* 07/18/2011 0755   CO2 21 07/18/2011 0755   BUN 49* 07/18/2011 0755   CREATININE 2.06* 07/18/2011 0755   GLUCOSE 126* 07/18/2011 0755   GLUCOSE 81 04/09/2006 1716   CALCIUM 9.7 07/18/2011 0755   CALCIUM 9.6 09/27/2006 2054   CBC:    Component Value Date/Time   WBC 6.7 07/17/2011 0550   WBC 6.1 03/06/2011 1231   HGB 12.4 07/17/2011 0550   HGB 11.4* 03/06/2011 1231   HCT 37.5 07/17/2011 0550   HCT 34.9 03/06/2011 1231   PLT 344 07/17/2011 0550   PLT 240 03/06/2011 1231   MCV 82.6 07/17/2011 0550   MCV 89.3 03/06/2011 1231   NEUTROABS 9.8* 07/13/2011 0920   NEUTROABS 4.5 03/06/2011 1231     LYMPHSABS 0.9 07/13/2011 0920   LYMPHSABS 0.8* 03/06/2011 1231   MONOABS 0.7 07/13/2011 0920   MONOABS 0.5 03/06/2011 1231   EOSABS 0.1 07/13/2011 0920   EOSABS 0.3 03/06/2011 1231   BASOSABS 0.0 07/13/2011 0920   BASOSABS 0.1 03/06/2011 1231      Lab 07/17/11 0550 07/16/11 0510 07/14/11 0548 07/13/11 0920 07/13/11 0228 07/13/11 0217  WBC 6.7 8.1 8.9 11.5* -- 11.7*  HGB 12.4 12.5 12.0 11.5* 13.3 --  HCT 37.5 38.8 36.4 35.3* 39.0 --  PLT 344 376 307 270 -- 291  MCV 82.6 84.3 85.2 86.3 -- 86.2  MCH 27.3 27.2 28.1 28.1 -- 28.3  MCHC 33.1 32.2 33.0 32.6 -- 32.8  RDW 13.5 13.4 13.6 13.8 -- 13.8  LYMPHSABS -- -- -- 0.9 -- 0.7  MONOABS -- -- -- 0.7 -- 0.5  EOSABS -- -- -- 0.1 -- 0.3  BASOSABS -- -- -- 0.0 -- 0.1  BANDABS -- -- -- -- -- --    Lab 07/18/11 0755 07/17/11 0550 07/16/11 0510 07/15/11 1257 07/15/11 0445 07/14/11 0548  NA 127* 129* 130* -- 134* 137  K 4.1 3.7 4.1 -- 3.9 4.2  CL 92* 93* 93* --  96 101  CO2 21 19 21  -- 23 23  GLUCOSE 126* 120* 110* -- 124* 120*  BUN 49* 53* 47* -- 34* 33*  CREATININE 2.06* 2.52* 2.51* -- 1.92* 1.84*  CALCIUM 9.7 9.7 10.4 -- 10.0 10.3  MG -- -- -- 1.8 -- 1.8    Lab 07/18/11 0755 07/17/11 0550 07/16/11 0510 07/15/11 0445 07/14/11 0548  INR 1.44 1.61* 1.85* 2.20* 1.76*  PROTIME -- -- -- -- --   Cardiac markers:  Lab 07/14/11 0548 07/13/11 2141 07/13/11 1340  CKMB 6.4* 7.1* 8.4*  TROPONINI 1.06* 1.30* 1.71*  MYOGLOBIN -- -- --    Recent Results (from the past 240 hour(s))  URINE CULTURE     Status: Normal   Collection Time   07/13/11  2:34 AM      Component Value Range Status Comment   Specimen Description URINE, CATHETERIZED   Final    Special Requests ADDED 07/13/11 0337   Final    Culture  Setup Time [id number]   Final    Colony Count >=100,000 COLONIES/ML   Final    Culture ESCHERICHIA COLI   Final    Report Status 07/14/2011 FINAL   Final    Organism ID, Bacteria ESCHERICHIA COLI   Final   MRSA PCR SCREENING     Status:  Normal   Collection Time   07/13/11  5:24 AM      Component Value Range Status Comment   MRSA by PCR NEGATIVE  NEGATIVE  Final     Studies/Results: No results found.  Medications: Scheduled Meds:   . anastrozole  1 mg Oral Daily  . aspirin  81 mg Oral Daily  . brimonidine  1 drop Both Eyes BID  . carvedilol  6.25 mg Oral BID WC  . ciprofloxacin  500 mg Oral Q breakfast  . insulin aspart  0-5 Units Subcutaneous QHS  . insulin aspart  0-9 Units Subcutaneous TID WC  . isosorbide-hydrALAZINE  1 tablet Oral BID  . levothyroxine  137 mcg Oral Daily  . pantoprazole  40 mg Oral Q1200  . sodium chloride  3 mL Intravenous Q12H  . timolol  1 drop Both Eyes BID  . vitamin D (CHOLECALCIFEROL)  400 Units Oral Daily   Continuous Infusions:   . heparin 1,500 Units/hr (07/17/11 2133)   PRN Meds:.sodium chloride, acetaminophen, clorazepate, methocarbamol, ondansetron (ZOFRAN) IV, prednisoLONE acetate, sodium chloride, zolpidem  Assessment/Plan:  Principal Problem:  *Flash pulmonary edema  - not evident on CXR and now clinically improving with no signs of congestion on physical exam  - cardiology following and plans to proceed with Cath once INR < 1.7 and renal function improves  - lasix discontinued, will provide gentle hydration  - BMP in AM   Active Problems:  Chronic kidney disease (CKD), stage IV (severe)  - will follow up on BMP in AM, please note that creatinine is trending down - d/c Lasix  - provide only gentle hydration   SOB (shortness of breath)  - likely multifactorial in nature and perhaps secondary to chronic systolic/diastolic heart failure  - pt denies SOB at the time of the examination and has been maintaining saturations > 92%   DIABETES MELLITUS, TYPE II  - continue insulin as noted above   Essential hypertension, benign  - uncontrolled  - increased Coreg dose - add Amlodipine and reassess in the AM  Lower extremity edema  - only trace edema evident    Atrial fibrillation  - in sinus rhythm this  AM  - rate controlled  - hold Coumadin in an anticipation of Cath on Monday 07/20/2011   Leukocytosis  - resolved   UTI (lower urinary tract infection)  - d/c Cipro  DEEP VENOUS THROMBOPHLEBITIS, LEG, LEFT  - hold Coumadin   Cardiomyopathy secondary  - cardiology following  - cath once INR < 1.7 and planned for Monday 07/20/2011   EDUCATION  - test results and diagnostic studies were discussed with patient  - patient verbalized the understanding    LOS: 5 days   Wendy Cole 07/18/2011, 12:29 PM  [facility] Pager: [phone]

## 2011-07-18 NOTE — Progress Notes (Signed)
BP now 162/57 at 1050. Will cont. To monitor. Chamberlain Steinborn, [name]

## 2011-07-18 NOTE — Progress Notes (Signed)
ANTICOAGULATION CONSULT NOTE - Follow Up Consult  Pharmacy Consult for heparin Indication: atrial fibrillation  Labs:  Basename 07/18/11 0755 07/17/11 0550 07/17/11 0050 07/16/11 0510  HGB -- 12.4 -- 12.5  HCT -- 37.5 -- 38.8  PLT -- 344 -- 376  APTT -- -- -- --  LABPROT 17.8* 19.4* -- 21.7*  INR 1.44 1.61* -- 1.85*  HEPARINUNFRC 0.59 0.41 0.45 --  CREATININE 2.06* 2.52* -- 2.51*  CKTOTAL -- -- -- --  CKMB -- -- -- --  TROPONINI -- -- -- --   Assessment: 75yo female now with INR<2, Coumadin on hold for pending cardiac cath. Heparin level (0.59) is at-goal on 1500 units/hr.  CBC stable.  No bleeding reported.  Goal of Therapy:  Heparin level 0.3-0.7 units/ml   Plan:  1. Continue IV heparin at 1500 units/hr.   2. Daily heparin level and CBC.  Elwin Sleight  07/18/2011,11:02 AM

## 2011-07-18 NOTE — Progress Notes (Signed)
Pt. BP elevated 0645 189/66,  Dr. Izola Price notified by night RN. Orders to be placed and Dr. Izola Price to come assess pt. Per night RN. Will cont. Monitor. [name], [name]

## 2011-07-18 NOTE — Progress Notes (Signed)
   Subjective: No chest pain or shortness of breath. No palpitations.   Objective: Temp:  [97.8 F (36.6 C)-98.2 F (36.8 C)] 97.9 F (36.6 C) (02/23 0556) Pulse Rate:  [63-86] 86  (02/23 0653) Resp:  [16-20] 20  (02/23 0556) BP: (120-215)/(57-81) 162/57 mmHg (02/23 1058) SpO2:  [93 %-98 %] 97 % (02/23 0556) Weight:  [222 lb 0.1 oz (100.7 kg)] 222 lb 0.1 oz (100.7 kg) (02/23 0526)  I/O last 3 completed shifts: In: 840 [P.O.:840] Out: -   Telemetry - Sinus rhythm.  Exam -   General - NAD.  Lungs - Clear, nonlabored.  Cardiac - Regular rate and rhythm, no S3.  Extremities - No pitting.  Testing -   Lab Results  Component Value Date   WBC 6.7 07/17/2011   HGB 12.4 07/17/2011   HCT 37.5 07/17/2011   MCV 82.6 07/17/2011   PLT 344 07/17/2011    Lab Results  Component Value Date   CREATININE 2.06* 07/18/2011   BUN 49* 07/18/2011   NA 127* 07/18/2011   K 4.1 07/18/2011   CL 92* 07/18/2011   CO2 21 07/18/2011    Lab Results  Component Value Date   CKTOTAL 60 07/14/2011   CKMB 6.4* 07/14/2011   TROPONINI 1.06* 07/14/2011    Current Medications    . anastrozole  1 mg Oral Daily  . aspirin  81 mg Oral Daily  . brimonidine  1 drop Both Eyes BID  . carvedilol  6.25 mg Oral BID WC  . ciprofloxacin  500 mg Oral Q breakfast  . insulin aspart  0-5 Units Subcutaneous QHS  . insulin aspart  0-9 Units Subcutaneous TID WC  . isosorbide-hydrALAZINE  1 tablet Oral BID  . levothyroxine  137 mcg Oral Daily  . pantoprazole  40 mg Oral Q1200  . sodium chloride  3 mL Intravenous Q12H  . timolol  1 drop Both Eyes BID  . vitamin D (CHOLECALCIFEROL)  400 Units Oral Daily    Assessment:  1. Acute on chronic systolic heart failure.  2. Cardiomyopathy, LVEF 25-30% with grade 2 diastolic dysfunction and wall motion abnormalities, concerning for ischemic etiology.  3. NSTEMI, managing medically at this point.  4. Renal insufficiency, acute on chronic, creatinine down to  2.0.  Plan:  Patient clinically stable at this time. Medications were reviewed. Diuretics are being held, currently not on ACE inhibitor, with some improvement in renal function. General plan is to consider a left and right heart catheterization if her renal function improves to a reasonable level. Alternatively a Myoview to be considered otherwise. Plan to observe her over the weekend.  Jonelle Sidle, M.D., F.A.C.C.

## 2011-07-19 LAB — BASIC METABOLIC PANEL
BUN: 38 mg/dL — ABNORMAL HIGH (ref 6–23)
Calcium: 9.3 mg/dL (ref 8.4–10.5)
Creatinine, Ser: 1.9 mg/dL — ABNORMAL HIGH (ref 0.50–1.10)
GFR calc Af Amer: 29 mL/min — ABNORMAL LOW (ref >90)
GFR calc non Af Amer: 25 mL/min — ABNORMAL LOW (ref >90)

## 2011-07-19 LAB — GLUCOSE, CAPILLARY: Glucose-Capillary: 130 mg/dL — ABNORMAL HIGH (ref 70–99)

## 2011-07-19 LAB — CBC
HCT: 34.4 % — ABNORMAL LOW (ref 36.0–46.0)
MCH: 27.1 pg (ref 26.0–34.0)
MCHC: 33.1 g/dL (ref 30.0–36.0)
MCV: 81.7 fL (ref 78.0–100.0)
RDW: 13.2 % (ref 11.5–15.5)

## 2011-07-19 LAB — PROTIME-INR: Prothrombin Time: 16.3 seconds — ABNORMAL HIGH (ref 11.6–15.2)

## 2011-07-19 MED ORDER — LORAZEPAM 1 MG PO TABS: 1.0000 mg | ORAL_TABLET | Freq: Two times a day (BID) | ORAL | Status: DC | PRN

## 2011-07-19 MED ORDER — SODIUM CHLORIDE 0.9 % IV SOLN: INTRAVENOUS | Status: DC

## 2011-07-19 NOTE — Progress Notes (Signed)
   Subjective: No chest pain or shortness of breath.   Objective: Temp:  [97.4 F (36.3 C)-98 F (36.7 C)] 97.4 F (36.3 C) (02/24 0435) Pulse Rate:  [60-63] 60  (02/24 0435) Resp:  [18-19] 19  (02/24 0435) BP: (144-195)/(57-68) 153/66 mmHg (02/24 0702) SpO2:  [96 %-98 %] 96 % (02/24 0435) Weight:  [222 lb 1.6 oz (100.744 kg)] 222 lb 1.6 oz (100.744 kg) (02/24 0702)  I/O last 3 completed shifts: In: 1080 [P.O.:1080] Out: -   Telemetry - Sinus rhythm.  Exam -   General - NAD.   Lungs - Clear, nonlabored.   Cardiac - Regular rate and rhythm, no S3.   Extremities - No pitting.   Testing -   Lab Results  Component Value Date   WBC 6.1 07/19/2011   HGB 11.4* 07/19/2011   HCT 34.4* 07/19/2011   MCV 81.7 07/19/2011   PLT 298 07/19/2011    Lab Results  Component Value Date   CREATININE 1.90* 07/19/2011   BUN 38* 07/19/2011   NA 129* 07/19/2011   K 3.7 07/19/2011   CL 96 07/19/2011   CO2 21 07/19/2011    Lab Results  Component Value Date   CKTOTAL 60 07/14/2011   CKMB 6.4* 07/14/2011   TROPONINI 1.06* 07/14/2011   INR 1.2  Current Medications    . amLODipine  10 mg Oral Daily  . anastrozole  1 mg Oral Daily  . aspirin  81 mg Oral Daily  . brimonidine  1 drop Both Eyes BID  . carvedilol  12.5 mg Oral BID WC  . insulin aspart  0-5 Units Subcutaneous QHS  . insulin aspart  0-9 Units Subcutaneous TID WC  . isosorbide-hydrALAZINE  1 tablet Oral BID  . levothyroxine  137 mcg Oral Daily  . pantoprazole  40 mg Oral Q1200  . sodium chloride  3 mL Intravenous Q12H  . timolol  1 drop Both Eyes BID  . vitamin D (CHOLECALCIFEROL)  400 Units Oral Daily  . DISCONTD: carvedilol  6.25 mg Oral BID WC  . DISCONTD: ciprofloxacin  500 mg Oral Q breakfast    Assessment:  1. Acute on chronic systolic heart failure.   2. Cardiomyopathy, LVEF 25-30% with grade 2 diastolic dysfunction and wall motion abnormalities, concerning for ischemic etiology.   3. NSTEMI, managing  medically at this point.   4. Renal insufficiency, acute on chronic, creatinine down to 1.9.  5. History of atrial fibrillation and phlebitis, Coumadin held. INR is normal.  Plan:  Discussed plan in detail with the patient today. It is not so much the INR that is a concern in terms of cardiac catheterization, rather her renal insufficiency. Creatinine has only come down to 1.9 at this point. She is not scheduled for angiography on Monday, unless her creatinine improves further.  Jonelle Sidle, M.D., F.A.C.C.

## 2011-07-19 NOTE — Progress Notes (Addendum)
[name], female   DOB: 07-08-1935, 76 y.o.   MRN: [id number] Subjective: No events overnight. Patient denies chest pain, shortness of breath, abdominal pain. Had bowel movement and reports ambulating.  Objective:  Vital signs in last 24 hours:  Filed Vitals:   07/18/11 2143 07/19/11 0435 07/19/11 0702 07/19/11 1500  BP: 160/62 195/68 153/66 142/68  Pulse:  60  60  Temp:  97.4 F (36.3 C)  97.5 F (36.4 C)  TempSrc:  Oral    Resp:  19  18  Height:      Weight:   100.744 kg (222 lb 1.6 oz)   SpO2:  96%  96%    Intake/Output from previous day:   Intake/Output Summary (Last 24 hours) at 07/19/11 1854 Last data filed at 07/19/11 1715  Gross per 24 hour  Intake 1597.95 ml  Output      0 ml  Net 1597.95 ml    Physical Exam: General: Alert, awake, oriented x3, in no acute distress. HEENT: No bruits, no goiter. Moist mucous membranes, no scleral icterus, no conjunctival pallor. Heart: Regular rate and rhythm, S1/S2 +, no murmurs, rubs, gallops. Lungs: Clear to auscultation bilaterally. No wheezing, no rhonchi, no rales.  Abdomen: Soft, nontender, nondistended, positive bowel sounds. Extremities: No clubbing or cyanosis, no pitting edema,  positive pedal pulses. Neuro: Grossly nonfocal.  Lab Results:  Basic Metabolic Panel:    Component Value Date/Time   NA 129* 07/19/2011 0555   K 3.7 07/19/2011 0555   CL 96 07/19/2011 0555   CO2 21 07/19/2011 0555   BUN 38* 07/19/2011 0555   CREATININE 1.90* 07/19/2011 0555   GLUCOSE 121* 07/19/2011 0555   GLUCOSE 81 04/09/2006 1716   CALCIUM 9.3 07/19/2011 0555   CALCIUM 9.6 09/27/2006 2054   CBC:    Component Value Date/Time   WBC 6.1 07/19/2011 0555   WBC 6.1 03/06/2011 1231   HGB 11.4* 07/19/2011 0555   HGB 11.4* 03/06/2011 1231   HCT 34.4* 07/19/2011 0555   HCT 34.9 03/06/2011 1231   PLT 298 07/19/2011 0555   PLT 240 03/06/2011 1231   MCV 81.7 07/19/2011 0555   MCV 89.3 03/06/2011 1231   NEUTROABS 9.8*  07/13/2011 0920   NEUTROABS 4.5 03/06/2011 1231   LYMPHSABS 0.9 07/13/2011 0920   LYMPHSABS 0.8* 03/06/2011 1231   MONOABS 0.7 07/13/2011 0920   MONOABS 0.5 03/06/2011 1231   EOSABS 0.1 07/13/2011 0920   EOSABS 0.3 03/06/2011 1231   BASOSABS 0.0 07/13/2011 0920   BASOSABS 0.1 03/06/2011 1231      Lab 07/19/11 0555 07/17/11 0550 07/16/11 0510 07/14/11 0548 07/13/11 0920 07/13/11 0217  WBC 6.1 6.7 8.1 8.9 11.5* --  HGB 11.4* 12.4 12.5 12.0 11.5* --  HCT 34.4* 37.5 38.8 36.4 35.3* --  PLT 298 344 376 307 270 --  MCV 81.7 82.6 84.3 85.2 86.3 --  MCH 27.1 27.3 27.2 28.1 28.1 --  MCHC 33.1 33.1 32.2 33.0 32.6 --  RDW 13.2 13.5 13.4 13.6 13.8 --  LYMPHSABS -- -- -- -- 0.9 0.7  MONOABS -- -- -- -- 0.7 0.5  EOSABS -- -- -- -- 0.1 0.3  BASOSABS -- -- -- -- 0.0 0.1  BANDABS -- -- -- -- -- --    Lab 07/19/11 0555 07/18/11 0755 07/17/11 0550 07/16/11 0510 07/15/11 1257 07/15/11 0445 07/14/11 0548  NA 129* 127* 129* 130* -- 134* --  K 3.7 4.1 3.7 4.1 -- 3.9 --  CL 96 92* 93*  93* -- 96 --  CO2 21 21 19 21  -- 23 --  GLUCOSE 121* 126* 120* 110* -- 124* --  BUN 38* 49* 53* 47* -- 34* --  CREATININE 1.90* 2.06* 2.52* 2.51* -- 1.92* --  CALCIUM 9.3 9.7 9.7 10.4 -- 10.0 --  MG -- -- -- -- 1.8 -- 1.8    Lab 07/19/11 0555 07/18/11 0755 07/17/11 0550 07/16/11 0510 07/15/11 0445  INR 1.29 1.44 1.61* 1.85* 2.20*  PROTIME -- -- -- -- --   Cardiac markers:  Lab 07/14/11 0548 07/13/11 2141 07/13/11 1340  CKMB 6.4* 7.1* 8.4*  TROPONINI 1.06* 1.30* 1.71*  MYOGLOBIN -- -- --   No components found with this basename: POCBNP:3 Recent Results (from the past 240 hour(s))  URINE CULTURE     Status: Normal   Collection Time   07/13/11  2:34 AM      Component Value Range Status Comment   Specimen Description URINE, CATHETERIZED   Final    Special Requests ADDED 07/13/11 0337   Final    Culture  Setup Time [id number]   Final    Colony Count >=100,000 COLONIES/ML   Final    Culture ESCHERICHIA COLI    Final    Report Status 07/14/2011 FINAL   Final    Organism ID, Bacteria ESCHERICHIA COLI   Final   MRSA PCR SCREENING     Status: Normal   Collection Time   07/13/11  5:24 AM      Component Value Range Status Comment   MRSA by PCR NEGATIVE  NEGATIVE  Final     Studies/Results: No results found.  Medications: Scheduled Meds:   . amLODipine  10 mg Oral Daily  . anastrozole  1 mg Oral Daily  . aspirin  81 mg Oral Daily  . brimonidine  1 drop Both Eyes BID  . carvedilol  12.5 mg Oral BID WC  . insulin aspart  0-5 Units Subcutaneous QHS  . insulin aspart  0-9 Units Subcutaneous TID WC  . isosorbide-hydrALAZINE  1 tablet Oral BID  . levothyroxine  137 mcg Oral Daily  . pantoprazole  40 mg Oral Q1200  . sodium chloride  3 mL Intravenous Q12H  . timolol  1 drop Both Eyes BID  . vitamin D (CHOLECALCIFEROL)  400 Units Oral Daily   Continuous Infusions:   . heparin 1,350 Units/hr (07/19/11 1318)   PRN Meds:.sodium chloride, acetaminophen, clorazepate, methocarbamol, ondansetron (ZOFRAN) IV, prednisoLONE acetate, sodium chloride, zolpidem  Assessment/Plan:  Principal Problem:  *Flash pulmonary edema  - not evident on CXR and now clinically improving with no signs of congestion on physical exam  - cardiology following and plans to proceed with Cath once INR < 1.7 and renal function improves  - lasix discontinued, will provide gentle hydration  - BMP in AM   Active Problems:  Chronic kidney disease (CKD), stage IV (severe)  - will follow up on BMP in AM, please note that creatinine is trending down  - d/c Lasix  - provide only gentle hydration   SOB (shortness of breath)  - likely multifactorial in nature and perhaps secondary to chronic systolic/diastolic heart failure  - pt denies SOB at the time of the examination and has been maintaining saturations > 92%   DIABETES MELLITUS, TYPE II  - continue insulin as noted above   Essential hypertension, benign  - uncontrolled    - increased Coreg dose  - add Amlodipine and reassess in the AM   Lower extremity  edema  - only trace edema evident   Atrial fibrillation  - in sinus rhythm this AM  - rate controlled  - hold Coumadin in an anticipation of Cath on Monday 07/20/2011   Leukocytosis  - resolved   UTI (lower urinary tract infection)  - d/c Cipro   DEEP VENOUS THROMBOPHLEBITIS, LEG, LEFT  - hold Coumadin   Cardiomyopathy secondary  - cardiology following  - cath once INR < 1.7 and once creatinine normalizes  EDUCATION  - test results and diagnostic studies were discussed with patient  - patient verbalized the understanding     LOS: 6 days   Debbora Presto 07/19/2011, 6:54 PM  [facility] Pager: [phone]

## 2011-07-19 NOTE — Progress Notes (Signed)
ANTICOAGULATION CONSULT NOTE - Follow Up Consult  Pharmacy Consult for heparin Indication: atrial fibrillation / ACS   Labs:  Basename 07/19/11 0555 07/18/11 0755 07/17/11 0550  HGB 11.4* -- 12.4  HCT 34.4* -- 37.5  PLT 298 -- 344  APTT -- -- --  LABPROT 16.3* 17.8* 19.4*  INR 1.29 1.44 1.61*  HEPARINUNFRC 0.73* 0.59 0.41  CREATININE 1.90* 2.06* 2.52*  CKTOTAL -- -- --  CKMB -- -- --  TROPONINI -- -- --   Assessment: 75yo female now with INR<2, Coumadin on hold for pending cardiac cath. Heparin level (0.73) is slightly supra-theapeutic.  CBC stable.  No bleeding reported.  Goal of Therapy:  Heparin level 0.3-0.7 units/ml   Plan:  1. Decrease heparin to 1350  units/hr.   2. Daily heparin level and CBC.  Elwin Sleight  07/19/2011,12:58 PM

## 2011-07-19 NOTE — Progress Notes (Signed)
Patient is extremely anxious and easily to get upset. Patient's blood pressure was 195/68 HR 60. Patient was adamant about walking to the bathroom and not using the bedside commode. Escorted patient and returned her to bed.Patient was given her Coreg 12.5mg  po at 0700. Will pass on to next RN to recheck her blood pressure.

## 2011-07-20 ENCOUNTER — Encounter (HOSPITAL_COMMUNITY)
Admission: EM | Disposition: A | Discharge status: Home / Self Care | Payer: Self-pay | Source: Home / Self Care | Attending: Internal Medicine

## 2011-07-20 ENCOUNTER — Telehealth: Payer: Self-pay | Admitting: *Deleted

## 2011-07-20 DIAGNOSIS — I509 Heart failure, unspecified: Secondary | ICD-10-CM

## 2011-07-20 HISTORY — PX: LEFT AND RIGHT HEART CATHETERIZATION WITH CORONARY/GRAFT ANGIOGRAM: SHX5448

## 2011-07-20 LAB — CBC
HCT: 34.2 % — ABNORMAL LOW (ref 36.0–46.0)
HCT: 34.9 % — ABNORMAL LOW (ref 36.0–46.0)
Hemoglobin: 11.2 g/dL — ABNORMAL LOW (ref 12.0–15.0)
Hemoglobin: 11.8 g/dL — ABNORMAL LOW (ref 12.0–15.0)
MCH: 27.6 pg (ref 26.0–34.0)
RBC: 4.16 MIL/uL (ref 3.87–5.11)
RBC: 4.28 MIL/uL (ref 3.87–5.11)
RDW: 13.3 % (ref 11.5–15.5)
WBC: 6 10*3/uL (ref 4.0–10.5)

## 2011-07-20 LAB — GLUCOSE, CAPILLARY: Glucose-Capillary: 100 mg/dL — ABNORMAL HIGH (ref 70–99)

## 2011-07-20 LAB — POCT I-STAT 3, VENOUS BLOOD GAS (G3P V)
Acid-base deficit: 5 mmol/L — ABNORMAL HIGH (ref 0.0–2.0)
pO2, Ven: 32 mmHg (ref 30.0–45.0)

## 2011-07-20 LAB — POCT I-STAT 3, ART BLOOD GAS (G3+)
Bicarbonate: 20.6 mEq/L (ref 20.0–24.0)
O2 Saturation: 92 %
TCO2: 22 mmol/L (ref 0–100)
pCO2 arterial: 33.1 mmHg — ABNORMAL LOW (ref 35.0–45.0)
pH, Arterial: 7.401 — ABNORMAL HIGH (ref 7.350–7.400)

## 2011-07-20 LAB — BASIC METABOLIC PANEL
BUN: 33 mg/dL — ABNORMAL HIGH (ref 6–23)
Chloride: 96 mEq/L (ref 96–112)
GFR calc Af Amer: 32 mL/min — ABNORMAL LOW (ref >90)
Potassium: 3.7 mEq/L (ref 3.5–5.1)

## 2011-07-20 LAB — CREATININE, SERUM: Creatinine, Ser: 1.64 mg/dL — ABNORMAL HIGH (ref 0.50–1.10)

## 2011-07-20 LAB — HEPARIN LEVEL (UNFRACTIONATED): Heparin Unfractionated: 0.36 IU/mL (ref 0.30–0.70)

## 2011-07-20 SURGERY — LEFT AND RIGHT HEART CATHETERIZATION WITH CORONARY/GRAFT ANGIOGRAM
Anesthesia: LOCAL | Laterality: Left

## 2011-07-20 MED ORDER — WARFARIN SODIUM 5 MG PO TABS
5.0000 mg | ORAL_TABLET | Freq: Once | ORAL | Status: AC
  Administered 2011-07-20: 5 mg via ORAL
  Filled 2011-07-20: qty 1

## 2011-07-20 MED ORDER — HEPARIN SODIUM (PORCINE) 5000 UNIT/ML IJ SOLN
5000.0000 [IU] | Freq: Three times a day (TID) | INTRAMUSCULAR | Status: DC
  Filled 2011-07-20 (×5): qty 1

## 2011-07-20 MED ORDER — ACETAMINOPHEN 325 MG PO TABS
ORAL_TABLET | ORAL | Status: AC
  Filled 2011-07-20: qty 2

## 2011-07-20 MED ORDER — ACETAMINOPHEN 325 MG PO TABS
650.0000 mg | ORAL_TABLET | ORAL | Status: DC | PRN
  Filled 2011-07-20: qty 2

## 2011-07-20 MED ORDER — SODIUM CHLORIDE 0.9 % IV SOLN: 1.0000 mL/kg/h | INTRAVENOUS | Status: AC

## 2011-07-20 MED ORDER — HEPARIN (PORCINE) IN NACL 2-0.9 UNIT/ML-% IJ SOLN
INTRAMUSCULAR | Status: AC
  Filled 2011-07-20: qty 2000

## 2011-07-20 MED ORDER — NITROGLYCERIN 0.2 MG/ML ON CALL CATH LAB
INTRAVENOUS | Status: AC
  Filled 2011-07-20: qty 1

## 2011-07-20 MED ORDER — ONDANSETRON HCL 4 MG/2ML IJ SOLN: 4.0000 mg | Freq: Four times a day (QID) | INTRAMUSCULAR | Status: DC | PRN

## 2011-07-20 MED ORDER — LIDOCAINE HCL (PF) 1 % IJ SOLN
INTRAMUSCULAR | Status: AC
  Filled 2011-07-20: qty 30

## 2011-07-20 MED ORDER — HEPARIN SODIUM (PORCINE) 5000 UNIT/ML IJ SOLN
5000.0000 [IU] | Freq: Three times a day (TID) | INTRAMUSCULAR | Status: DC
  Administered 2011-07-20 – 2011-07-21 (×2): 5000 [IU] via SUBCUTANEOUS
  Filled 2011-07-20 (×5): qty 1

## 2011-07-20 MED ORDER — MIDAZOLAM HCL 2 MG/2ML IJ SOLN
INTRAMUSCULAR | Status: AC
  Filled 2011-07-20: qty 2

## 2011-07-20 MED ORDER — FENTANYL CITRATE 0.05 MG/ML IJ SOLN
INTRAMUSCULAR | Status: AC
  Filled 2011-07-20: qty 2

## 2011-07-20 NOTE — Progress Notes (Addendum)
ANTICOAGULATION CONSULT NOTE - Follow Up Consult  Pharmacy Consult:  Heparin Indication:  Atrial fibrillation  Allergies  Allergen Reactions  . Atorvastatin Other (See Comments)    Muscle aches  . Colesevelam Other (See Comments)    unknown    Patient Measurements: Height: 5' 4.96" (165 cm) Weight: 224 lb 13.9 oz (102 kg) IBW/kg (Calculated) : 56.91  Heparin Dosing Weight: 80kg  Vital Signs: Temp: 97.7 F (36.5 C) (02/25 0442) Temp src: Oral (02/25 0442) BP: 154/54 mmHg (02/25 0442) Pulse Rate: 59  (02/25 0442)  Labs:  Basename 07/20/11 0525 07/19/11 0555 07/18/11 0755  HGB 11.2* 11.4* --  HCT 34.2* 34.4* --  PLT 299 298 --  APTT -- -- --  LABPROT 15.0 16.3* 17.8*  INR 1.16 1.29 1.44  HEPARINUNFRC 0.36 0.73* 0.59  CREATININE 1.74* 1.90* 2.06*  CKTOTAL -- -- --  CKMB -- -- --  TROPONINI -- -- --   Estimated Creatinine Clearance: 34 ml/min (by C-G formula based on Cr of 1.74).    Assessment: 75 YOF on heparin bridge while Coumadin on hold for cardiac cath.   Patient was on Coumadin PTA for h/o left leg DVT and Afib.  Heparin level therapeutic, no bleeding noted.    Goal of Therapy:  HL:  0.3 - 0.7 unit/mL   Plan:  - Continue heparin gtt at 1350 units/hr - Daily HL/CBC - F/U post cath - F/U resume home meds as appropriate post cath (bupropion, febuxostat, Advair)   Phillips Climes, PharmD 07/20/2011,9:26 AM  _________________________________________________  Addendum @ 1415  S/p cath with plan to manage cardiomyopathy medically.  Spoke to Dr. [place], will d/c heparin gtt and start patient on heparin SQ bridge until INR is therapeutic.  Patient to also resume Coumadin per Pharmacy for h/o DVT and Afib.  INR subtherapeutic at 1.16.  Home Coumadin dose is 2mg  PO daily except 3mg  on Mon/Wed.  Plan: - Coumadin 5mg  PO today - Daily PT/INR - Pharmacy to d/c heparin SQ once INR >/= 2   Phillips Climes, PharmD 07/20/2011

## 2011-07-20 NOTE — Telephone Encounter (Signed)
Husband left voice mail that patient is in [place] now. Will be having a cardiac cath tomorrow and will be making adjustments in coumadin. May need to review her appointments with Dr. Truett Perna and make changes.

## 2011-07-20 NOTE — Progress Notes (Signed)
[name], female   DOB: 11-27-1935, 76 y.o.   MRN: [id number]  Subjective: No events overnight. Patient denies chest pain, shortness of breath, abdominal pain. Had bowel movement and reports ambulating.  Objective:  Vital signs in last 24 hours:  Filed Vitals:   07/19/11 1500 07/19/11 2038 07/20/11 0442 07/20/11 0546  BP: 142/68 152/59 154/54   Pulse: 60 64 59   Temp: 97.5 F (36.4 C) 98 F (36.7 C) 97.7 F (36.5 C)   TempSrc:  Oral Oral   Resp: 18 18 19    Height:      Weight:    102 kg (224 lb 13.9 oz)  SpO2: 96% 96% 93%     Intake/Output from previous day:   Intake/Output Summary (Last 24 hours) at 07/20/11 0710 Last data filed at 07/19/11 1715  Gross per 24 hour  Intake 1597.95 ml  Output      0 ml  Net 1597.95 ml    Physical Exam: General: Alert, awake, oriented x3, in no acute distress. HEENT: No bruits, no goiter. Moist mucous membranes, no scleral icterus, no conjunctival pallor. Heart: Regular rate and rhythm, S1/S2 +, no murmurs, rubs, gallops. Lungs: Clear to auscultation bilaterally. No wheezing, no rhonchi, no rales.  Abdomen: Soft, nontender, nondistended, positive bowel sounds. Extremities: No clubbing or cyanosis, no pitting edema,  positive pedal pulses. Neuro: Grossly nonfocal.  Lab Results:  Basic Metabolic Panel:    Component Value Date/Time   NA 129* 07/19/2011 0555   K 3.7 07/19/2011 0555   CL 96 07/19/2011 0555   CO2 21 07/19/2011 0555   BUN 38* 07/19/2011 0555   CREATININE 1.90* 07/19/2011 0555   GLUCOSE 121* 07/19/2011 0555   GLUCOSE 81 04/09/2006 1716   CALCIUM 9.3 07/19/2011 0555   CALCIUM 9.6 09/27/2006 2054   CBC:    Component Value Date/Time   WBC 6.0 07/20/2011 0525   WBC 6.1 03/06/2011 1231   HGB 11.2* 07/20/2011 0525   HGB 11.4* 03/06/2011 1231   HCT 34.2* 07/20/2011 0525   HCT 34.9 03/06/2011 1231   PLT 299 07/20/2011 0525   PLT 240 03/06/2011 1231   MCV 82.2 07/20/2011 0525   MCV 89.3 03/06/2011 1231   NEUTROABS 9.8* 07/13/2011 0920   NEUTROABS 4.5 03/06/2011 1231   LYMPHSABS 0.9 07/13/2011 0920   LYMPHSABS 0.8* 03/06/2011 1231   MONOABS 0.7 07/13/2011 0920   MONOABS 0.5 03/06/2011 1231   EOSABS 0.1 07/13/2011 0920   EOSABS 0.3 03/06/2011 1231   BASOSABS 0.0 07/13/2011 0920   BASOSABS 0.1 03/06/2011 1231      Lab 07/20/11 0525 07/19/11 0555 07/17/11 0550 07/16/11 0510 07/14/11 0548 07/13/11 0920  WBC 6.0 6.1 6.7 8.1 8.9 --  HGB 11.2* 11.4* 12.4 12.5 12.0 --  HCT 34.2* 34.4* 37.5 38.8 36.4 --  PLT 299 298 344 376 307 --  MCV 82.2 81.7 82.6 84.3 85.2 --  MCH 26.9 27.1 27.3 27.2 28.1 --  MCHC 32.7 33.1 33.1 32.2 33.0 --  RDW 13.3 13.2 13.5 13.4 13.6 --  LYMPHSABS -- -- -- -- -- 0.9  MONOABS -- -- -- -- -- 0.7  EOSABS -- -- -- -- -- 0.1  BASOSABS -- -- -- -- -- 0.0  BANDABS -- -- -- -- -- --    Lab 07/19/11 0555 07/18/11 0755 07/17/11 0550 07/16/11 0510 07/15/11 1257 07/15/11 0445 07/14/11 0548  NA 129* 127* 129* 130* -- 134* --  K 3.7 4.1 3.7 4.1 -- 3.9 --  CL  96 92* 93* 93* -- 96 --  CO2 21 21 19 21  -- 23 --  GLUCOSE 121* 126* 120* 110* -- 124* --  BUN 38* 49* 53* 47* -- 34* --  CREATININE 1.90* 2.06* 2.52* 2.51* -- 1.92* --  CALCIUM 9.3 9.7 9.7 10.4 -- 10.0 --  MG -- -- -- -- 1.8 -- 1.8    Lab 07/20/11 0525 07/19/11 0555 07/18/11 0755 07/17/11 0550 07/16/11 0510  INR 1.16 1.29 1.44 1.61* 1.85*  PROTIME -- -- -- -- --   Cardiac markers:  Lab 07/14/11 0548 07/13/11 2141 07/13/11 1340  CKMB 6.4* 7.1* 8.4*  TROPONINI 1.06* 1.30* 1.71*  MYOGLOBIN -- -- --   No components found with this basename: POCBNP:3 Recent Results (from the past 240 hour(s))  URINE CULTURE     Status: Normal   Collection Time   07/13/11  2:34 AM      Component Value Range Status Comment   Specimen Description URINE, CATHETERIZED   Final    Special Requests ADDED 07/13/11 0337   Final    Culture  Setup Time [id number]   Final    Colony Count >=100,000 COLONIES/ML   Final    Culture  ESCHERICHIA COLI   Final    Report Status 07/14/2011 FINAL   Final    Organism ID, Bacteria ESCHERICHIA COLI   Final   MRSA PCR SCREENING     Status: Normal   Collection Time   07/13/11  5:24 AM      Component Value Range Status Comment   MRSA by PCR NEGATIVE  NEGATIVE  Final     Studies/Results: No results found.  Medications: Scheduled Meds:   . amLODipine  10 mg Oral Daily  . anastrozole  1 mg Oral Daily  . aspirin  81 mg Oral Daily  . brimonidine  1 drop Both Eyes BID  . carvedilol  12.5 mg Oral BID WC  . insulin aspart  0-5 Units Subcutaneous QHS  . insulin aspart  0-9 Units Subcutaneous TID WC  . isosorbide-hydrALAZINE  1 tablet Oral BID  . levothyroxine  137 mcg Oral Daily  . pantoprazole  40 mg Oral Q1200  . sodium chloride  3 mL Intravenous Q12H  . timolol  1 drop Both Eyes BID  . vitamin D (CHOLECALCIFEROL)  400 Units Oral Daily   Continuous Infusions:   . sodium chloride    . heparin 1,350 Units/hr (07/19/11 1318)   PRN Meds:.sodium chloride, acetaminophen, clorazepate, LORazepam, methocarbamol, ondansetron (ZOFRAN) IV, prednisoLONE acetate, sodium chloride, zolpidem  Assessment/Plan:  Principal Problem:  *Flash pulmonary edema  - not evident on CXR and now clinically improving with no signs of congestion on physical exam  - cardiology following and plans to proceed with Cath today - lasix discontinued, will provide gentle hydration  - BMP in AM   Active Problems:  Chronic kidney disease (CKD), stage IV (severe)  - will follow up on BMP in AM, please note that creatinine is trending down  - d/c Lasix  - provide only gentle hydration   SOB (shortness of breath)  - likely multifactorial in nature and perhaps secondary to chronic systolic/diastolic heart failure  - pt denies SOB at the time of the examination and has been maintaining saturations > 92%   DIABETES MELLITUS, TYPE II  - continue insulin as noted above   Essential hypertension, benign  -  uncontrolled  - increased Coreg dose  - add Amlodipine and reassess in the AM   Lower  extremity edema  - only trace edema evident   Atrial fibrillation  - in sinus rhythm this AM  - rate controlled  - hold Coumadin in an anticipation of Cath  Leukocytosis  - resolved   UTI (lower urinary tract infection)  - d/c Cipro   DEEP VENOUS THROMBOPHLEBITIS, LEG, LEFT  - hold Coumadin   Cardiomyopathy secondary  - cardiology following  - cath once INR < 1.7 and once creatinine normalizes   EDUCATION  - test results and diagnostic studies were discussed with patient  - patient verbalized the understanding    LOS: 7 days   MAGICK-Merly Hinkson 07/20/2011, 7:10 AM  [facility] Pager: [phone]

## 2011-07-20 NOTE — Op Note (Signed)
   Cardiac Catheterization Procedure Note  Name: Wendy Cole MRN: [id number] DOB: [birth year]  Procedure: Right Heart Cath, Left Heart Cath, Selective Coronary Angiography, LV angiography  Indication: 76 year old white female who presents with new-onset congestive heart failure. Ejection fraction is 25-30% by echocardiogram. She has evidence of a non-ST elevation myocardial infarction.   Procedural Details: The right groin was prepped, draped, and anesthetized with 1% lidocaine. Using the modified Seldinger technique a 5 French sheath was placed in the right femoral artery and a 7 French sheath was placed in the right femoral vein. A Swan-Ganz catheter was used for the right heart catheterization. Standard protocol was followed for recording of right heart pressures and sampling of oxygen saturations. Fick cardiac output was calculated. Standard Judkins catheters were used for selective coronary angiography and left ventriculography. There were no immediate procedural complications. The patient was transferred to the post catheterization recovery area for further monitoring.  Procedural Findings: Hemodynamics RA 7/4 with a mean of 4 mm of mercury RV 28/3 mmHg PA 23/8 with a mean of 15 mm of mercury PCWP 10/9 with a mean of 7 mm of mercury LV 128/6 mmHg AO 118/44 with a mean of 69 mmHg  Oxygen saturations: PA 58% AO 92%  Cardiac Output (Fick) 5.34 L per minute  Cardiac Index (Fick) 2.57 L per minute per meter square   Coronary angiography: Coronary dominance: right  Left mainstem: Less than 10% irregularities.  Left anterior descending (LAD): Is a large vessel with mild irregularities less than 20%. The diagonal branch has 20-30% disease at its origin.  There is a large ramus intermediate branch without significant disease.  Left circumflex (LCx): Scattered irregularities less than 20%.  Right coronary artery (RCA): This is a dominant vessel. It is mildly calcified. There  is diffuse 10-20% narrowing throughout the mid vessel.  Left ventriculography: N/A  Final Conclusions:   1. Mild nonobstructive atherosclerotic coronary disease. 2. Normal right heart and left ventricular filling pressures. 3. Slight aortic valve gradient.  Recommendations: Recommend continued medical therapy for her cardiomyopathy.   Everley Evora [place] 07/20/2011, 10:57 AM

## 2011-07-20 NOTE — H&P (View-Only) (Signed)
[name], female   DOB: [birth year], 76 y.o.   MRN: [id number] Subjective:  No chest pain or sob.  Objective:  Vital Signs in the last 24 hours: Temp:  [97.5 F (36.4 C)-98 F (36.7 C)] 97.7 F (36.5 C) (02/25 0442) Pulse Rate:  [59-64] 59  (02/25 0442) Resp:  [18-19] 19  (02/25 0442) BP: (142-154)/(54-68) 154/54 mmHg (02/25 0442) SpO2:  [93 %-96 %] 93 % (02/25 0442) Weight:  [102 kg (224 lb 13.9 oz)] 102 kg (224 lb 13.9 oz) (02/25 0546)  Intake/Output from previous day: 02/24 0701 - 02/25 0700 In: 1598 [P.O.:480; I.V.:1118] Out: -  Intake/Output from this shift:    Physical Exam: Well appearing, obese, woman, NAD HEENT: Unremarkable Neck:  No JVD, no thyromegally Lymphatics:  No adenopathy Back:  No CVA tenderness Lungs:  Clear with no wheezes. HEART:  Regular rate rhythm, no murmurs, no rubs, no clicks Abd:  Flat, positive bowel sounds, no organomegally, no rebound, no guarding Ext:  2 plus pulses, no edema, no cyanosis, no clubbing Skin:  No rashes no nodules Neuro:  CN II through XII intact, motor grossly intact  Lab Results:  Basename 07/20/11 0525 07/19/11 0555  WBC 6.0 6.1  HGB 11.2* 11.4*  PLT 299 298    Basename 07/20/11 0525 07/19/11 0555  NA 128* 129*  K 3.7 3.7  CL 96 96  CO2 22 21  GLUCOSE 117* 121*  BUN 33* 38*  CREATININE 1.74* 1.90*   No results found for this basename: TROPONINI:2,CK,MB:2 in the last 72 hours Hepatic Function Panel No results found for this basename: PROT,ALBUMIN,AST,ALT,ALKPHOS,BILITOT,BILIDIR,IBILI in the last 72 hours No results found for this basename: CHOL in the last 72 hours No results found for this basename: PROTIME in the last 72 hours  Imaging: No results found.  Cardiac Studies: Tele - NSR Assessment/Plan:  1. Acute/chronic systolic chf - will plan on proceeding with right/left heart cath 2. NSTEMI - plan left heart cath today.  3. Chronic renal insufficiency - her renal function is  better. Will plan to proceed with cath.   LOS: 7 days    Lewayne Bunting 07/20/2011, 8:57 AM

## 2011-07-20 NOTE — Progress Notes (Signed)
[name], female   DOB: 06/27/1935, 75 y.o.   MRN: [id number] Subjective:  No chest pain or sob.  Objective:  Vital Signs in the last 24 hours: Temp:  [97.5 F (36.4 C)-98 F (36.7 C)] 97.7 F (36.5 C) (02/25 0442) Pulse Rate:  [59-64] 59  (02/25 0442) Resp:  [18-19] 19  (02/25 0442) BP: (142-154)/(54-68) 154/54 mmHg (02/25 0442) SpO2:  [93 %-96 %] 93 % (02/25 0442) Weight:  [102 kg (224 lb 13.9 oz)] 102 kg (224 lb 13.9 oz) (02/25 0546)  Intake/Output from previous day: 02/24 0701 - 02/25 0700 In: 1598 [P.O.:480; I.V.:1118] Out: -  Intake/Output from this shift:    Physical Exam: Well appearing, obese, woman, NAD HEENT: Unremarkable Neck:  No JVD, no thyromegally Lymphatics:  No adenopathy Back:  No CVA tenderness Lungs:  Clear with no wheezes. HEART:  Regular rate rhythm, no murmurs, no rubs, no clicks Abd:  Flat, positive bowel sounds, no organomegally, no rebound, no guarding Ext:  2 plus pulses, no edema, no cyanosis, no clubbing Skin:  No rashes no nodules Neuro:  CN II through XII intact, motor grossly intact  Lab Results:  Basename 07/20/11 0525 07/19/11 0555  WBC 6.0 6.1  HGB 11.2* 11.4*  PLT 299 298    Basename 07/20/11 0525 07/19/11 0555  NA 128* 129*  K 3.7 3.7  CL 96 96  CO2 22 21  GLUCOSE 117* 121*  BUN 33* 38*  CREATININE 1.74* 1.90*   No results found for this basename: TROPONINI:2,CK,MB:2 in the last 72 hours Hepatic Function Panel No results found for this basename: PROT,ALBUMIN,AST,ALT,ALKPHOS,BILITOT,BILIDIR,IBILI in the last 72 hours No results found for this basename: CHOL in the last 72 hours No results found for this basename: PROTIME in the last 72 hours  Imaging: No results found.  Cardiac Studies: Tele - NSR Assessment/Plan:  1. Acute/chronic systolic chf - will plan on proceeding with right/left heart cath 2. NSTEMI - plan left heart cath today.  3. Chronic renal insufficiency - her renal function is  better. Will plan to proceed with cath.   LOS: 7 days    Wendy Cole 07/20/2011, 8:57 AM     

## 2011-07-20 NOTE — Interval H&P Note (Signed)
History and Physical Interval Note:  07/20/2011 10:23 AM  Wendy Cole  has presented today for surgery, with the diagnosis of Chest Pain  The various methods of treatment have been discussed with the patient and family. After consideration of risks, benefits and other options for treatment, the patient has consented to  Procedure(s) (LRB): LEFT AND RIGHT HEART CATHETERIZATION WITH CORONARY/GRAFT ANGIOGRAM (Left) as a surgical intervention .  The patients' history has been reviewed, patient examined, no change in status, stable for surgery.  I have reviewed the patients' chart and labs.  Questions were answered to the patient's satisfaction.     [name], [facility]

## 2011-07-21 ENCOUNTER — Telehealth: Payer: Self-pay | Admitting: Oncology

## 2011-07-21 LAB — BASIC METABOLIC PANEL
BUN: 27 mg/dL — ABNORMAL HIGH (ref 6–23)
Creatinine, Ser: 1.74 mg/dL — ABNORMAL HIGH (ref 0.50–1.10)
GFR calc Af Amer: 32 mL/min — ABNORMAL LOW (ref >90)
GFR calc non Af Amer: 28 mL/min — ABNORMAL LOW (ref >90)
Potassium: 4.2 mEq/L (ref 3.5–5.1)

## 2011-07-21 LAB — GLUCOSE, CAPILLARY: Glucose-Capillary: 114 mg/dL — ABNORMAL HIGH (ref 70–99)

## 2011-07-21 MED ORDER — METHOCARBAMOL 500 MG PO TABS: 500.0000 mg | ORAL_TABLET | Freq: Four times a day (QID) | ORAL | Status: AC | PRN

## 2011-07-21 MED ORDER — HYDROCODONE-ACETAMINOPHEN 5-500 MG PO TABS: 1.0000 | ORAL_TABLET | Freq: Four times a day (QID) | ORAL | Status: AC | PRN

## 2011-07-21 MED ORDER — AMLODIPINE BESYLATE 10 MG PO TABS: 10.0000 mg | ORAL_TABLET | Freq: Every day | ORAL | Status: DC

## 2011-07-21 MED ORDER — CARVEDILOL 12.5 MG PO TABS: 12.5000 mg | ORAL_TABLET | Freq: Two times a day (BID) | ORAL | Status: DC

## 2011-07-21 MED ORDER — ISOSORB DINITRATE-HYDRALAZINE 20-37.5 MG PO TABS: 1.0000 | ORAL_TABLET | Freq: Two times a day (BID) | ORAL | Status: DC

## 2011-07-21 MED ORDER — SODIUM CHLORIDE 0.9 % IV SOLN: INTRAVENOUS | Status: DC

## 2011-07-21 MED ORDER — LORAZEPAM 1 MG PO TABS: 1.0000 mg | ORAL_TABLET | Freq: Two times a day (BID) | ORAL | Status: AC | PRN

## 2011-07-21 MED ORDER — ASPIRIN 81 MG PO CHEW: 324.0000 mg | CHEWABLE_TABLET | ORAL | Status: DC

## 2011-07-21 NOTE — Discharge Instructions (Signed)
Chest Pain (Nonspecific) It is often hard to give a specific diagnosis for the cause of chest pain. There is always a chance that your pain could be related to something serious, such as a heart attack or a blood clot in the lungs. You need to follow up with your caregiver for further evaluation. CAUSES   Heartburn.   Pneumonia or bronchitis.   Anxiety and stress.   Inflammation around your heart (pericarditis) or lung (pleuritis or pleurisy).   A blood clot in the lung.   A collapsed lung (pneumothorax). It can develop suddenly on its own (spontaneous pneumothorax) or from injury (trauma) to the chest.  The chest wall is composed of bones, muscles, and cartilage. Any of these can be the source of the pain.  The bones can be bruised by injury.   The muscles or cartilage can be strained by coughing or overwork.   The cartilage can be affected by inflammation and become sore (costochondritis).  DIAGNOSIS  Lab tests or other studies, such as X-rays, an EKG, stress testing, or cardiac imaging, may be needed to find the cause of your pain.  TREATMENT   Treatment depends on what may be causing your chest pain. Treatment may include:   Acid blockers for heartburn.   Anti-inflammatory medicine.   Pain medicine for inflammatory conditions.   Antibiotics if an infection is present.   You may be advised to change lifestyle habits. This includes stopping smoking and avoiding caffeine and chocolate.   You may be advised to keep your head raised (elevated) when sleeping. This reduces the chance of acid going backward from your stomach into your esophagus.   Most of the time, nonspecific chest pain will improve within 2 to 3 days with rest and mild pain medicine.  HOME CARE INSTRUCTIONS   If antibiotics were prescribed, take the full amount even if you start to feel better.   For the next few days, avoid physical activities that bring on chest pain. Continue physical activities as  directed.   Do not smoke cigarettes or drink alcohol until your symptoms are gone.   Only take over-the-counter or prescription medicine for pain, discomfort, or fever as directed by your caregiver.   Follow your caregiver's suggestions for further testing if your chest pain does not go away.   Keep any follow-up appointments you made. If you do not go to an appointment, you could develop lasting (chronic) problems with pain. If there is any problem keeping an appointment, you must call to reschedule.  SEEK MEDICAL CARE IF:   You think you are having problems from the medicine you are taking. Read your medicine instructions carefully.   Your chest pain does not go away, even after treatment.   You develop a rash with blisters on your chest.  SEEK IMMEDIATE MEDICAL CARE IF:   You have increased chest pain or pain that spreads to your arm, neck, jaw, back, or belly (abdomen).   You develop shortness of breath, an increasing cough, or you are coughing up blood.   You have severe back or abdominal pain, feel sick to your stomach (nauseous) or throw up (vomit).   You develop severe weakness, fainting, or chills.   You have an oral temperature above 102 F (38.9 C), not controlled by medicine.  THIS IS AN EMERGENCY. Do not wait to see if the pain will go away. Get medical help at once. Call your local emergency services (911 in [place] Do not drive yourself to   the hospital. MAKE SURE YOU:   Understand these instructions.   Will watch your condition.   Will get help right away if you are not doing well or get worse.  Document Released: 02/18/2005 Document Revised: 01/21/2011 Document Reviewed: 12/15/2007 [employer] Patient Information 2012 [employer]. 

## 2011-07-21 NOTE — Progress Notes (Signed)
RT placed patient on cpap per home setting of 10 cmH20. Patient is tolerating cpap well. RT will monitor.

## 2011-07-21 NOTE — Telephone Encounter (Signed)
dr Lenise Arena from [facility] called,pt in hosp about to be dc and needs lab  and coumadin,she also verified other appts  aom

## 2011-07-21 NOTE — Progress Notes (Signed)
[name], female   DOB: 11-Sep-1935, 76 y.o.   MRN: [id number] [name], female   DOB: 05/16/1936, 76 y.o.   MRN: [id number] Subjective:  No chest pain or sob.  Objective:  Vital Signs in the last 24 hours: Temp:  [97.3 F (36.3 C)-98.6 F (37 C)] 98.6 F (37 C) (02/26 6644) Pulse Rate:  [56-67] 67  (02/26 0632) Resp:  [19] 19  (02/26 0632) BP: (146-154)/(65-66) 154/66 mmHg (02/26 0655) SpO2:  [94 %-96 %] 94 % (02/26 0347)  Intake/Output from previous day: 02/25 0701 - 02/26 0700 In: -  Out: 800 [Urine:800] Intake/Output from this shift:    Physical Exam: Well appearing, obese, woman, NAD HEENT: Unremarkable Neck:  No JVD, no thyromegally Lymphatics:  No adenopathy Back:  No CVA tenderness Lungs:  Clear with no wheezes. HEART:  Regular rate rhythm, no murmurs, no rubs, no clicks Abd:  Flat, positive bowel sounds, no organomegally, no rebound, no guarding Ext:  2 plus pulses, no edema, no cyanosis, no clubbing. No groin hematoma. Skin:  No rashes no nodules Neuro:  CN II through XII intact, motor grossly intact  Lab Results:  Basename 07/20/11 1605 07/20/11 0525  WBC 7.2 6.0  HGB 11.8* 11.2*  PLT 322 299    Basename 07/21/11 0455 07/20/11 1605 07/20/11 0525  NA 131* -- 128*  K 4.2 -- 3.7  CL 99 -- 96  CO2 22 -- 22  GLUCOSE 112* -- 117*  BUN 27* -- 33*  CREATININE 1.74* 1.64* --   No results found for this basename: TROPONINI:2,CK,MB:2 in the last 72 hours Hepatic Function Panel No results found for this basename: PROT,ALBUMIN,AST,ALT,ALKPHOS,BILITOT,BILIDIR,IBILI in the last 72 hours No results found for this basename: CHOL in the last 72 hours No results found for this basename: PROTIME in the last 72 hours  Imaging: No results found.  Cardiac Studies: Tele - NSR Assessment/Plan:  1. Acute/chronic systolic chf - nonischemic 2. NSTEMI - type 2. No significant CAD by cath  3. Chronic renal insufficiency -  creatnine up slightly post cath.  Plan: continue coreg, bidil. Not a candidate for ACEi at this point due to renal insufficiency. Will resume coumadin for history of DVT- followed at oncology office.  Will need cardiology follow up with [facility] in 1-2 weeks.  Repeat creatnine in 1-2 days.  Ok for discharge from cardiac standpoint.   LOS: 8 days    Tamber Burtch [place] 07/21/2011, 8:40 AM

## 2011-07-21 NOTE — Discharge Summary (Signed)
Patient ID: MYRELLA FAHS MRN: [id number] DOB/AGE: 01/10/1936 76 y.o.  Admit date: 07/13/2011 Discharge date: 07/21/2011  Primary Care Physician:  Sonda Primes, MD, MD  Discharge Diagnoses:    Present on Admission:  .Flash pulmonary edema .SOB (shortness of breath) .DIABETES MELLITUS, TYPE II .HYPERLIPIDEMIA .OBSTRUCTIVE SLEEP APNEA .GOUT .HYPERTENSION .Essential hypertension, benign .Chronic kidney disease (CKD), stage IV (severe) .BREAST CANCER; OTHER .EXOGENOUS OBESITY .ANXIETY .DEEP VENOUS THROMBOPHLEBITIS, LEG, LEFT .LOW BACK PAIN .DEPRESSION .Lower extremity edema .Leukocytosis .UTI (lower urinary tract infection) .Cardiomyopathy secondary  Principal Problem:  *Flash pulmonary edema Active Problems:  HYPERLIPIDEMIA  Chronic kidney disease (CKD), stage IV (severe)  SOB (shortness of breath)  DIABETES MELLITUS, TYPE II  OBSTRUCTIVE SLEEP APNEA  Essential hypertension, benign  Lower extremity edema  Atrial fibrillation  Leukocytosis  UTI (lower urinary tract infection)  DEEP VENOUS THROMBOPHLEBITIS, LEG, LEFT  Cardiomyopathy secondary   Medication List  As of 07/21/2011  9:38 AM   STOP taking these medications         enalapril 10 MG tablet      naproxen sodium 220 MG tablet      traMADol 50 MG tablet         TAKE these medications         amLODipine 10 MG tablet   Commonly known as: NORVASC   Take 1 tablet (10 mg total) by mouth daily.      anastrozole 1 MG tablet   Commonly known as: ARIMIDEX   Take 1 mg by mouth daily.      BIOTIN PO   Take 1 tablet by mouth daily.      brimonidine 0.15 % ophthalmic solution   Commonly known as: ALPHAGAN   Place 1 drop into both eyes daily.      buPROPion 150 MG 12 hr tablet   Commonly known as: WELLBUTRIN SR   Take 150 mg by mouth daily.      CALCIUM 500 1250 MG tablet   Generic drug: calcium carbonate   Take 1 tablet by mouth daily.      carvedilol 12.5 MG tablet   Commonly known as:  COREG   Take 1 tablet (12.5 mg total) by mouth 2 (two) times daily with a meal.      clorazepate 7.5 MG tablet   Commonly known as: TRANXENE   Take 7.5 mg by mouth 2 (two) times daily as needed. For anxiety      Febuxostat 80 MG Tabs   Take 1 tablet by mouth daily.      Fluticasone-Salmeterol 100-50 MCG/DOSE Aepb   Commonly known as: ADVAIR   Inhale 1 puff into the lungs every 12 (twelve) hours.      HYDROcodone-acetaminophen 5-500 MG per tablet   Commonly known as: VICODIN   Take 1 tablet by mouth every 6 (six) hours as needed for pain.      isosorbide-hydrALAZINE 20-37.5 MG per tablet   Commonly known as: BIDIL   Take 1 tablet by mouth 2 (two) times daily.      levothyroxine 137 MCG tablet   Commonly known as: SYNTHROID, LEVOTHROID   Take 137 mcg by mouth daily.      LORazepam 1 MG tablet   Commonly known as: ATIVAN   Take 1 tablet (1 mg total) by mouth every 12 (twelve) hours as needed for anxiety.      methocarbamol 500 MG tablet   Commonly known as: ROBAXIN   Take 1 tablet (500 mg total) by mouth every  6 (six) hours as needed.      omeprazole 40 MG capsule   Commonly known as: PRILOSEC   Take 40 mg by mouth daily.      prednisoLONE acetate 1 % ophthalmic suspension   Commonly known as: PRED FORTE   Place 1 drop into both eyes daily as needed. For eye pain      timolol 0.5 % ophthalmic solution   Commonly known as: TIMOPTIC   Place 1 drop into both eyes 2 (two) times daily. Twice daily      vitamin D (CHOLECALCIFEROL) 400 UNITS tablet   Take 400 Units by mouth daily.      warfarin 1 MG tablet   Commonly known as: COUMADIN   Take 2-3 mg by mouth See admin instructions. Takes 3 mg on Mon, and Wed. Takes 2mg   Tues,Thurs, Fri, Sat Sun            Disposition and Follow-up: Will need follow up with cardiology and PCP in 2-3 weeks.  Consults:  cardiology  Significant Diagnostic Studies:  Dg Chest Port 1 View 07/13/2011 IMPRESSION: Bibasilar airspace  opacities, with underlying vascular congestion and mild cardiomegaly.  The appearance may reflect pulmonary edema or multifocal pneumonia.  Question of small left pleural effusion.     Brief H and P: KENDA KLOEHN is an 76 y.o. female presents with complaints of sudden onset of SOB at about midnight. She was about to get ready to go to sleep and she was not able to get her breath. She reports feeling heaviness in her chest. Her CPAP did not relieve her SOB, and EMS was called. EMS gave her IV Lasix on route. In the ED she was evaluated and her chest X-Ray revealed Pulmonary edema, and a Pro-BNP was 4000. She has atrial fibrillation, a previous CVA, and a previous DVT of the LLE and is on chronic coumadin therapy. She denies any previous history of CHF. She does report having edema in he fee and legs at times. She recently has been taking increased naproxen for her gout pain.   Physical Exam on Discharge:  Filed Vitals:   07/20/11 1010 07/20/11 2130 07/21/11 0632 07/21/11 0655  BP:  146/65  154/66  Pulse: 67 56 67   Temp:  97.3 F (36.3 C) 98.6 F (37 C)   TempSrc:  Oral Oral   Resp:  19 19   Height:      Weight:      SpO2:  96% 94%      Intake/Output Summary (Last 24 hours) at 07/21/11 0938 Last data filed at 07/21/11 0640  Gross per 24 hour  Intake      0 ml  Output    800 ml  Net   -800 ml    General: Alert, awake, oriented x3, in no acute distress. HEENT: No bruits, no goiter. Heart: Regular rate and rhythm, without murmurs, rubs, gallops. Lungs: Clear to auscultation bilaterally. Abdomen: Soft, nontender, nondistended, positive bowel sounds. Extremities: No clubbing cyanosis or edema with positive pedal pulses. Neuro: Grossly intact, nonfocal.  CBC:    Component Value Date/Time   WBC 7.2 07/20/2011 1605   WBC 6.1 03/06/2011 1231   HGB 11.8* 07/20/2011 1605   HGB 11.4* 03/06/2011 1231   HCT 34.9* 07/20/2011 1605   HCT 34.9 03/06/2011 1231   PLT 322 07/20/2011 1605    PLT 240 03/06/2011 1231   MCV 81.5 07/20/2011 1605   MCV 89.3 03/06/2011 1231   NEUTROABS 9.8* 07/13/2011  0920   NEUTROABS 4.5 03/06/2011 1231   LYMPHSABS 0.9 07/13/2011 0920   LYMPHSABS 0.8* 03/06/2011 1231   MONOABS 0.7 07/13/2011 0920   MONOABS 0.5 03/06/2011 1231   EOSABS 0.1 07/13/2011 0920   EOSABS 0.3 03/06/2011 1231   BASOSABS 0.0 07/13/2011 0920   BASOSABS 0.1 03/06/2011 1231    Basic Metabolic Panel:    Component Value Date/Time   NA 131* 07/21/2011 0455   K 4.2 07/21/2011 0455   CL 99 07/21/2011 0455   CO2 22 07/21/2011 0455   BUN 27* 07/21/2011 0455   CREATININE 1.74* 07/21/2011 0455   GLUCOSE 112* 07/21/2011 0455   GLUCOSE 81 04/09/2006 1716   CALCIUM 9.7 07/21/2011 0455   CALCIUM 9.6 09/27/2006 2054    Hospital Course:  Principal Problem:  *Flash pulmonary edema/SOB - pt has responded well to the treatment and has maintained oxygen saturations > 95% on RA - given CAD, cardiology proceeded with cardiac cath which was not indicative of any acute events - pt will need to follow up in an outpatient setting  Active Problems:  HYPERLIPIDEMIA - stable - continue statin   Chronic kidney disease (CKD), stage IV (severe) - creatinine level was trending down during the hospitalization - this will have to be checked in PCp office to ensure it continues to trend down   DIABETES MELLITUS, TYPE II   Atrial fibrillation - continue coumadin upon discharge  Time spent on Discharge: Over 30 minutes  Signed: Debbora Presto 07/21/2011, 9:38 AM  [phone]

## 2011-07-23 ENCOUNTER — Other Ambulatory Visit (HOSPITAL_BASED_OUTPATIENT_CLINIC_OR_DEPARTMENT_OTHER): Payer: Medicare Other | Admitting: Lab

## 2011-07-23 ENCOUNTER — Other Ambulatory Visit: Payer: Self-pay | Admitting: *Deleted

## 2011-07-23 ENCOUNTER — Telehealth: Payer: Self-pay | Admitting: *Deleted

## 2011-07-23 ENCOUNTER — Ambulatory Visit: Payer: Medicare Other

## 2011-07-23 DIAGNOSIS — I4891 Unspecified atrial fibrillation: Secondary | ICD-10-CM

## 2011-07-23 LAB — PROTIME-INR
INR: 1.3 — ABNORMAL LOW (ref 2.00–3.50)
Protime: 15.6 Seconds — ABNORMAL HIGH (ref 10.6–13.4)

## 2011-07-23 NOTE — Telephone Encounter (Signed)
Talked with pt & reported to cont same dose of coumadin = 3 mg M & TH & 2 mg all other days & to repeat PT/INR in 2 wks.  Will cancel 07/27/11 lab appt & Scheduler will call her with appt date & time.

## 2011-07-24 ENCOUNTER — Telehealth: Payer: Self-pay | Admitting: *Deleted

## 2011-07-24 NOTE — Telephone Encounter (Signed)
Husband called confused about her appointments and requests schedule be faxed to his home. Rescheduled the 3/4 lab to 3/14 and faxed March/April calendar to his home fax #.

## 2011-07-27 ENCOUNTER — Other Ambulatory Visit: Payer: Medicare Other | Admitting: Lab

## 2011-08-06 ENCOUNTER — Other Ambulatory Visit (HOSPITAL_BASED_OUTPATIENT_CLINIC_OR_DEPARTMENT_OTHER): Payer: Medicare Other | Admitting: Lab

## 2011-08-06 DIAGNOSIS — Z5181 Encounter for therapeutic drug level monitoring: Secondary | ICD-10-CM

## 2011-08-06 DIAGNOSIS — I4891 Unspecified atrial fibrillation: Secondary | ICD-10-CM

## 2011-08-06 DIAGNOSIS — I82509 Chronic embolism and thrombosis of unspecified deep veins of unspecified lower extremity: Secondary | ICD-10-CM

## 2011-08-06 DIAGNOSIS — Z7901 Long term (current) use of anticoagulants: Secondary | ICD-10-CM

## 2011-08-06 LAB — PROTIME-INR: Protime: 26.4 Seconds — ABNORMAL HIGH (ref 10.6–13.4)

## 2011-08-07 ENCOUNTER — Encounter: Payer: Self-pay | Admitting: Nurse Practitioner

## 2011-08-07 ENCOUNTER — Ambulatory Visit (INDEPENDENT_AMBULATORY_CARE_PROVIDER_SITE_OTHER): Payer: Medicare Other | Admitting: Nurse Practitioner

## 2011-08-07 VITALS — BP 128/62 | HR 70 | Ht 64.0 in | Wt 232.0 lb

## 2011-08-07 DIAGNOSIS — R0609 Other forms of dyspnea: Secondary | ICD-10-CM

## 2011-08-07 DIAGNOSIS — I428 Other cardiomyopathies: Secondary | ICD-10-CM

## 2011-08-07 DIAGNOSIS — R0600 Dyspnea, unspecified: Secondary | ICD-10-CM

## 2011-08-07 DIAGNOSIS — N289 Disorder of kidney and ureter, unspecified: Secondary | ICD-10-CM

## 2011-08-07 LAB — BASIC METABOLIC PANEL
BUN: 22 mg/dL (ref 6–23)
CO2: 23 mEq/L (ref 19–32)
Calcium: 9.1 mg/dL (ref 8.4–10.5)
Chloride: 107 mEq/L (ref 96–112)
Creatinine, Ser: 1.7 mg/dL — ABNORMAL HIGH (ref 0.4–1.2)
GFR: 31.28 mL/min — ABNORMAL LOW (ref >60.00)
Glucose, Bld: 109 mg/dL — ABNORMAL HIGH (ref 70–99)
Potassium: 4.2 mEq/L (ref 3.5–5.1)
Sodium: 138 mEq/L (ref 135–145)

## 2011-08-07 LAB — BRAIN NATRIURETIC PEPTIDE: Pro B Natriuretic peptide (BNP): 288 pg/mL — ABNORMAL HIGH (ref 0.0–100.0)

## 2011-08-07 MED ORDER — TORSEMIDE 20 MG PO TABS: 20.0000 mg | ORAL_TABLET | Freq: Every day | ORAL | Status: DC

## 2011-08-07 NOTE — Patient Instructions (Signed)
Restrict your salt to less than 2000 mg per day.  Stay on your current medicines. We are adding Demedex 20 mg daily to help with your swelling.   Weigh each day. If your weight goes up 3 pounds overnight, take an extra fluid pill.  We will see you back in 2 weeks.  We are checking your labs today.  Call the [facility] office at [phone] if you have any questions, problems or concerns.

## 2011-08-07 NOTE — Progress Notes (Signed)
Loren Racer Date of Birth: [birth year] Medical Record #[id number]  History of Present Illness: Ms. Hollick is seen back today for a post hospital visit. She is seen for Dr. [place]. She presented last month with acute shortness of breath/pulmonary edema. Her enzymes were positive. She had a heart cath showing mild nonobstructive disease with normal right heart and left ventricular filling pressures. Her echo showed an EF of 25 to 30%. She will be managed medically. Her other issues include CKD, obesity, HTN, sleep apnea, prior DVT, chronic anticoagulation, PAF, diabetes and HLD. She is currently not on ACE or ARB.   She is here today with husband. She says she is doing ok. She is quite limited by her arthritis and had been using lots of NSAIDS prior to this recent admission. She has been home about 2 1/2 weeks. No chest pain. Not really short of breath. Has had issues with swelling. She found an old bottle of Demedex and has been using it occasionally for the swelling. She is now using some Hydrocodone and Tylenol for her gout. She does not have good insight as to the issue at hand. She does not understand the need for salt restriction. Her and her family eat out quite a bit ([employer], [place], etc). She is not weighing daily. She likes to cook and bake. She does not exercise and is quite limited due to diffuse arthritic pain and gout. She was previously on Enalapril which was stopped due to renal insufficiency. She is now on Bidil. Coreg is not a new drug for her. She has had no follow up labs since her discharge. No problems with her groin reported.   Current Outpatient Prescriptions on File Prior to Visit  Medication Sig Dispense Refill  . amLODipine (NORVASC) 10 MG tablet Take 1 tablet (10 mg total) by mouth daily.  31 tablet  3  . anastrozole (ARIMIDEX) 1 MG tablet Take 1 mg by mouth daily.      [name] BIOTIN PO Take 1 tablet by mouth daily.      . brimonidine (ALPHAGAN) 0.15 %  ophthalmic solution Place 1 drop into both eyes daily.       [name] buPROPion (WELLBUTRIN SR) 150 MG 12 hr tablet Take 150 mg by mouth daily.      . calcium carbonate (CALCIUM 500) 1250 MG tablet Take 1 tablet by mouth daily.      . carvedilol (COREG) 12.5 MG tablet Take 1 tablet (12.5 mg total) by mouth 2 (two) times daily with a meal.  62 tablet  3  . clorazepate (TRANXENE) 7.5 MG tablet Take 7.5 mg by mouth 2 (two) times daily as needed. For anxiety      . Febuxostat 80 MG TABS Take 1 tablet by mouth daily.      . Fluticasone-Salmeterol (ADVAIR) 100-50 MCG/DOSE AEPB Inhale 1 puff into the lungs every 12 (twelve) hours.      . isosorbide-hydrALAZINE (BIDIL) 20-37.5 MG per tablet Take 1 tablet by mouth 2 (two) times daily.  62 tablet  3  . levothyroxine (SYNTHROID, LEVOTHROID) 137 MCG tablet Take 137 mcg by mouth daily.      [name] omeprazole (PRILOSEC) 40 MG capsule Take 40 mg by mouth daily.       . prednisoLONE acetate (PRED FORTE) 1 % ophthalmic suspension Place 1 drop into both eyes daily as needed. For eye pain      . timolol (TIMOPTIC) 0.5 % ophthalmic solution Place 1 drop into both eyes 2 (two)  times daily. Twice daily      . vitamin D, CHOLECALCIFEROL, 400 UNITS tablet Take 400 Units by mouth daily.       [name] warfarin (COUMADIN) 1 MG tablet Take 2-3 mg by mouth See admin instructions. Takes 3 mg on Mon, and Wed. Takes 2mg   Tues,Thurs, Fri, Sat Sun        Allergies  Allergen Reactions  . Atorvastatin Other (See Comments)    Muscle aches  . Colesevelam Other (See Comments)    unknown  . Lasix (Furosemide)     Past Medical History  Diagnosis Date  . OSA on CPAP   . Anemia   . Anxiety states   . Depressive disorder, not elsewhere classified   . Type II or unspecified type diabetes mellitus without mention of complication, not stated as uncontrolled   . Other and unspecified hyperlipidemia   . Unspecified essential hypertension   . Renal insufficiency   . Osteoarthritis   . Gout   .  Iritis   . Stroke   . Nonischemic cardiomyopathy     EF 20 to 25% per echo 06/2011  . Chronic anticoagulation   . NSTEMI (non-ST elevated myocardial infarction) Feb 2013    Mild nonobstructive disease. No LV gram    Past Surgical History  Procedure Date  . Mastectomy   . Breast lumpectomy   . Cataract extraction   . Foot surgery   . Mastectomy     History  Smoking status  . Former Smoker -- 2.0 packs/day for 40 years  . Types: Cigarettes  . Quit date: 05/25/2002  Smokeless tobacco  . Never Used    History  Alcohol Use No    Family History  Problem Relation Age of Onset  . Hypertension    . Heart disease Mother 78    CHF  . Cancer Mother     breast  . Heart disease Father 74  . Heart disease Brother     aaa    Review of Systems: The review of systems is per the HPI.   All other systems were reviewed and are negative.  Physical Exam: Ht 5\' 4"  (1.626 m)  Wt 232 lb (105.235 kg)  BMI 39.82 kg/m2 Patient is very pleasant and in no acute distress. She is morbidly obese. Skin is warm and dry. Color is normal.  HEENT is unremarkable. Normocephalic/atraumatic. PERRL. Sclera are nonicteric. Neck is supple. No masses. No JVD. Lungs are clear. Cardiac exam shows a regular rate and rhythm today.  Abdomen is obese but soft. Extremities are with 1+ edema. Gait and ROM are intact. No gross neurologic deficits noted.   LABORATORY DATA: Lab Results  Component Value Date   WBC 7.2 07/20/2011   HGB 11.8* 07/20/2011   HCT 34.9* 07/20/2011   PLT 322 07/20/2011   GLUCOSE 112* 07/21/2011   CHOL 234* 11/20/2009   TRIG 238.0* 11/20/2009   HDL 37.70* 11/20/2009   LDLDIRECT 152.8 11/20/2009   LDLCALC  Value: 154        Total Cholesterol/HDL:CHD Risk Coronary Heart Disease Risk Table                     Men   Women  1/2 Average Risk   3.4   3.3  Average Risk       5.0   4.4  2 X Average Risk   9.6   7.1  3 X Average Risk  23.4   11.0  Use the calculated Patient Ratio above and the CHD  Risk Table to determine the patient's CHD Risk.        ATP III CLASSIFICATION (LDL):  <100     mg/dL   Optimal  440-102  mg/dL   Near or Above                    Optimal  130-159  mg/dL   Borderline  725-366  mg/dL   High  >440     mg/dL   Very High* 3/47/4259   ALT 16 06/10/2011   AST 22 06/10/2011   NA 131* 07/21/2011   K 4.2 07/21/2011   CL 99 07/21/2011   CREATININE 1.74* 07/21/2011   BUN 27* 07/21/2011   CO2 22 07/21/2011   TSH 5.406* 07/13/2011   INR 2.20 08/06/2011   HGBA1C 6.2* 07/13/2011   MICROALBUR 3.1* 09/27/2006   Dg Chest Port 1 View  07/14/2011  *RADIOLOGY REPORT*  Clinical Data: Pulmonary edema, follow up effusions  PORTABLE CHEST - 1 VIEW  Comparison: 07/13/2011  Findings: Cardiomegaly again noted.  Central mild vascular congestion without convincing edema.  There is mild improvement in aeration.  Residual mild basilar atelectasis.  Linear atelectasis or infiltrate in lingula.  IMPRESSION: No convincing pulmonary edema.  Slight improved aeration.  Mild basilar atelectasis.  Linear atelectasis or infiltrate in lingula. Cardiomegaly again noted.  Original Report Authenticated By: Natasha Mead, M.D.   Dg Chest Port 1 View  07/13/2011  *RADIOLOGY REPORT*  Clinical Data: Shortness of breath; respiratory distress.  PORTABLE CHEST - 1 VIEW  Comparison: Chest radiograph performed 01/31/2009, and CT of the chest performed 02/04/2009  Findings: The lungs are well-aerated.  Bibasilar airspace opacities are noted, with underlying vascular congestion.  This may reflect pulmonary edema or multifocal pneumonia.  Blunting of the left costophrenic angle raises question for a small left pleural effusion.  The cardiomediastinal silhouette is mildly enlarged.  No acute osseous abnormalities are seen.  IMPRESSION: Bibasilar airspace opacities, with underlying vascular congestion and mild cardiomegaly.  The appearance may reflect pulmonary edema or multifocal pneumonia.  Question of small left pleural effusion.   Original Report Authenticated By: Tonia Ghent, M.D.   Assessment / Plan:

## 2011-08-07 NOTE — Assessment & Plan Note (Signed)
We are rechecking her labs today 

## 2011-08-07 NOTE — Assessment & Plan Note (Signed)
Patient presents with a recent admission for acute systolic failure. Has a nonischemic CM with no significant coronary disease. EF is 25 to 30%. She is on Bidil and Coreg. I have added Demedex 20 mg daily. She will continue with her other medicines but we do have some room for up titration. We have discussed in depth the need for better food choices, salt restriction and daily weights. We are checking labs today. She would like for Dr. Eden Emms to follow her since he sees her husband. Dr. Eden Emms has agreed and will see her back in follow up.

## 2011-08-07 NOTE — Progress Notes (Signed)
Addended by: Scherrie Bateman E on: 08/07/2011 05:16 PM   Modules accepted: Orders

## 2011-08-10 ENCOUNTER — Ambulatory Visit: Payer: Medicare Other | Admitting: Internal Medicine

## 2011-08-10 ENCOUNTER — Other Ambulatory Visit (INDEPENDENT_AMBULATORY_CARE_PROVIDER_SITE_OTHER): Payer: Medicare Other

## 2011-08-10 DIAGNOSIS — E875 Hyperkalemia: Secondary | ICD-10-CM

## 2011-08-10 LAB — BASIC METABOLIC PANEL
BUN: 28 mg/dL — ABNORMAL HIGH (ref 6–23)
Calcium: 9.5 mg/dL (ref 8.4–10.5)
Chloride: 102 mEq/L (ref 96–112)
Creatinine, Ser: 1.9 mg/dL — ABNORMAL HIGH (ref 0.4–1.2)

## 2011-08-11 ENCOUNTER — Telehealth: Payer: Self-pay | Admitting: *Deleted

## 2011-08-11 NOTE — Telephone Encounter (Signed)
Husband notified of INR--same coumadin per Dr. Truett Perna and f/u 4/19 as scheduled.

## 2011-08-11 NOTE — Telephone Encounter (Signed)
Message copied by Wandalee Ferdinand on Tue Aug 11, 2011  7:01 PM ------      Message from: Thornton Papas B      Created: Mon Aug 10, 2011 10:17 PM       Please call patient, same coumadin, f/u PT 1 month

## 2011-08-14 ENCOUNTER — Other Ambulatory Visit (INDEPENDENT_AMBULATORY_CARE_PROVIDER_SITE_OTHER): Payer: Medicare Other

## 2011-08-14 DIAGNOSIS — N289 Disorder of kidney and ureter, unspecified: Secondary | ICD-10-CM

## 2011-08-14 LAB — BASIC METABOLIC PANEL
BUN: 27 mg/dL — ABNORMAL HIGH (ref 6–23)
CO2: 24 mEq/L (ref 19–32)
Calcium: 9.6 mg/dL (ref 8.4–10.5)
Chloride: 100 mEq/L (ref 96–112)
Creatinine, Ser: 1.6 mg/dL — ABNORMAL HIGH (ref 0.4–1.2)
GFR: 33.06 mL/min — ABNORMAL LOW (ref >60.00)
Glucose, Bld: 108 mg/dL — ABNORMAL HIGH (ref 70–99)
Potassium: 4.3 mEq/L (ref 3.5–5.1)
Sodium: 133 mEq/L — ABNORMAL LOW (ref 135–145)

## 2011-08-28 ENCOUNTER — Other Ambulatory Visit: Payer: Medicare Other | Admitting: Lab

## 2011-08-28 ENCOUNTER — Ambulatory Visit: Payer: Medicare Other | Admitting: Oncology

## 2011-09-07 ENCOUNTER — Encounter: Payer: Self-pay | Admitting: Cardiovascular Disease

## 2011-09-07 ENCOUNTER — Ambulatory Visit (INDEPENDENT_AMBULATORY_CARE_PROVIDER_SITE_OTHER): Payer: Medicare Other | Admitting: Cardiovascular Disease

## 2011-09-07 VITALS — BP 113/48 | HR 54 | Wt 224.0 lb

## 2011-09-07 DIAGNOSIS — I428 Other cardiomyopathies: Secondary | ICD-10-CM

## 2011-09-07 DIAGNOSIS — E785 Hyperlipidemia, unspecified: Secondary | ICD-10-CM

## 2011-09-07 DIAGNOSIS — I10 Essential (primary) hypertension: Secondary | ICD-10-CM

## 2011-09-07 DIAGNOSIS — R600 Localized edema: Secondary | ICD-10-CM

## 2011-09-07 DIAGNOSIS — R609 Edema, unspecified: Secondary | ICD-10-CM

## 2011-09-07 DIAGNOSIS — E119 Type 2 diabetes mellitus without complications: Secondary | ICD-10-CM

## 2011-09-07 NOTE — Assessment & Plan Note (Signed)
Her last A1c 4/12 was 6.5  She does not watch her diet well.  Discussed low carb diet.  Will add A1c to labs next week and she can discuss Rx with Dr Paulette Blanch

## 2011-09-07 NOTE — Assessment & Plan Note (Signed)
Improved with with demedex.  Stop amlodipine

## 2011-09-07 NOTE — Assessment & Plan Note (Signed)
Well controlled.  Continue current medications and low sodium Dash type diet.    

## 2011-09-07 NOTE — Progress Notes (Signed)
Wendy Cole is seen back today for a post hospital visit. She is seen for Dr. [place]il[place]. She presented last month with acute shortness of breath/pulmonary edema. Her enzymes were positive. She had a heart cath showing mild nonobstructive disease with normal right heart and left ventricular filling pressures. Her echo showed an EF of 25 to 30%. She will be managed medically. Her other issues include CKD, obesity, HTN, sleep apnea, prior DVT, chronic anticoagulation, PAF, diabetes and HLD. She is currently not on ACE or ARB.  She is here today with husband. She says she is doing ok. She is quite limited by her arthritis and had been using lots of NSAIDS prior to this recent admission. She has been home about 2 1/2 weeks. No chest pain. Not really short of breath. Has had issues with swelling. She found an old bottle of Demedex and has been using it occasionally for the swelling. She is now using some Hydrocodone and Tylenol for her gout. She does not have good insight as to the issue at hand. She does not understand the need for salt restriction. Her and her family eat out quite a bit ([employer], [place], etc). She is not weighing daily. She likes to cook and bake. She does not exercise and is quite limited due to diffuse arthritic pain and gout. She was previously on Enalapril which was stopped due to renal insufficiency. She is now on Bidil. Coreg is not a new drug for her. She has had no follow up labs since her discharge. No problems with her groin reported.   ROS: Denies fever, malais, weight loss, blurry vision, decreased visual acuity, cough, sputum, SOB, hemoptysis, pleuritic pain, palpitaitons, heartburn, abdominal pain, melena, lower extremity edema, claudication, or rash.  All other systems reviewed and negative  General: Affect appropriate Healthy:  appears stated age HEENT: normal Neck supple with no adenopathy JVP normal no bruits no thyromegaly Lungs clear with no wheezing and good  diaphragmatic motion Heart:  S1/S2 no murmur, no rub, gallop or click PMI normal Abdomen: benighn, BS positve, no tenderness, no AAA no bruit.  No HSM or HJR Distal pulses intact with no bruits No edema Neuro non-focal Skin warm and dry No muscular weakness   Current Outpatient Prescriptions  Medication Sig Dispense Refill  . amLODipine (NORVASC) 10 MG tablet Take 1 tablet (10 mg total) by mouth daily.  31 tablet  3  . anastrozole (ARIMIDEX) 1 MG tablet Take 1 mg by mouth daily.      [name] BIOTIN PO Take 1 tablet by mouth daily.      . brimonidine (ALPHAGAN) 0.15 % ophthalmic solution Place 1 drop into both eyes daily.       [name] buPROPion (WELLBUTRIN SR) 150 MG 12 hr tablet Take 150 mg by mouth daily.      . calcium carbonate (CALCIUM 500) 1250 MG tablet Take 1 tablet by mouth daily.      . carvedilol (COREG) 12.5 MG tablet Take 1 tablet (12.5 mg total) by mouth 2 (two) times daily with a meal.  62 tablet  3  . clorazepate (TRANXENE) 7.5 MG tablet Take 7.5 mg by mouth 2 (two) times daily as needed. For anxiety      . Febuxostat 80 MG TABS Take 1 tablet by mouth daily.      . Fluticasone-Salmeterol (ADVAIR) 100-50 MCG/DOSE AEPB Inhale 1 puff into the lungs every 12 (twelve) hours.      [name] HYDROcodone-acetaminophen (VICODIN) 5-500 MG per tablet Take 1  tablet by mouth every 6 (six) hours as needed.      . isosorbide-hydrALAZINE (BIDIL) 20-37.5 MG per tablet Take 1 tablet by mouth 2 (two) times daily.  62 tablet  3  . levothyroxine (SYNTHROID, LEVOTHROID) 137 MCG tablet Take 137 mcg by mouth daily.      [name] LORazepam (ATIVAN) 1 MG tablet Take 1 mg by mouth every 8 (eight) hours as needed.      . methocarbamol (ROBAXIN) 500 MG tablet Take 500 mg by mouth as needed.      [name] omeprazole (PRILOSEC) 40 MG capsule Take 40 mg by mouth daily.       . prednisoLONE acetate (PRED FORTE) 1 % ophthalmic suspension Place 1 drop into both eyes daily as needed. For eye pain      . timolol (TIMOPTIC) 0.5 % ophthalmic  solution Place 1 drop into both eyes 2 (two) times daily. Twice daily      . torsemide (DEMADEX) 20 MG tablet Take 1 tablet (20 mg total) by mouth daily.  30 tablet  6  . vitamin D, CHOLECALCIFEROL, 400 UNITS tablet Take 400 Units by mouth daily.       [name] warfarin (COUMADIN) 1 MG tablet Take 2-3 mg by mouth See admin instructions. Takes 3 mg on Mon, and Wed. Takes 2mg   Tues,Thurs, Fri, Sat Sun        Allergies  Atorvastatin; Colesevelam; and Lasix  Electrocardiogram: 07/22/11  SR rate 77 poor R wave progression  Assessment and Plan

## 2011-09-07 NOTE — Assessment & Plan Note (Signed)
Cholesterol is at goal.  Continue current dose of statin and diet Rx.  No myalgias or side effects.  F/U  LFT's in 6 months. Lab Results  Component Value Date   LDLCALC  Value: 154        Total Cholesterol/HDL:CHD Risk Coronary Heart Disease Risk Table                     Men   Women  1/2 Average Risk   3.4   3.3  Average Risk       5.0   4.4  2 X Average Risk   9.6   7.1  3 X Average Risk  23.4   11.0        Use the calculated Patient Ratio above and the CHD Risk Table to determine the patient's CHD Risk.        ATP III CLASSIFICATION (LDL):  <100     mg/dL   Optimal  100-129  mg/dL   Near or Above                    Optimal  130-159  mg/dL   Borderline  160-189  mg/dL   High  >190     mg/dL   Very High* 02/01/2009             

## 2011-09-07 NOTE — Patient Instructions (Signed)
Your physician recommends that you schedule a follow-up appointment in: 4-6 WEEKS WITH DR [facility] Your physician has recommended you make the following change in your medication: STOP AMLODIPINE

## 2011-09-07 NOTE — Assessment & Plan Note (Signed)
Encouraged her to take the bidil.  Stressed low salt low carb diet. Reassess EF in 6 months

## 2011-09-08 ENCOUNTER — Other Ambulatory Visit: Payer: Self-pay | Admitting: *Deleted

## 2011-09-08 ENCOUNTER — Telehealth: Payer: Self-pay | Admitting: *Deleted

## 2011-09-08 DIAGNOSIS — I10 Essential (primary) hypertension: Secondary | ICD-10-CM

## 2011-09-08 DIAGNOSIS — E785 Hyperlipidemia, unspecified: Secondary | ICD-10-CM

## 2011-09-08 DIAGNOSIS — E119 Type 2 diabetes mellitus without complications: Secondary | ICD-10-CM

## 2011-09-08 DIAGNOSIS — E038 Other specified hypothyroidism: Secondary | ICD-10-CM

## 2011-09-08 NOTE — Telephone Encounter (Signed)
Left detailed mess informing pt labs are entered into system for her upcoming OV with AVP.

## 2011-09-09 ENCOUNTER — Other Ambulatory Visit (INDEPENDENT_AMBULATORY_CARE_PROVIDER_SITE_OTHER): Payer: Medicare Other

## 2011-09-09 DIAGNOSIS — E785 Hyperlipidemia, unspecified: Secondary | ICD-10-CM

## 2011-09-09 DIAGNOSIS — E038 Other specified hypothyroidism: Secondary | ICD-10-CM

## 2011-09-09 DIAGNOSIS — I10 Essential (primary) hypertension: Secondary | ICD-10-CM

## 2011-09-09 DIAGNOSIS — E119 Type 2 diabetes mellitus without complications: Secondary | ICD-10-CM

## 2011-09-09 LAB — COMPREHENSIVE METABOLIC PANEL
AST: 21 U/L (ref 0–37)
Alkaline Phosphatase: 72 U/L (ref 39–117)
BUN: 24 mg/dL — ABNORMAL HIGH (ref 6–23)
Creatinine, Ser: 1.8 mg/dL — ABNORMAL HIGH (ref 0.4–1.2)
Total Bilirubin: 0.3 mg/dL (ref 0.3–1.2)

## 2011-09-09 LAB — LIPID PANEL
Cholesterol: 221 mg/dL — ABNORMAL HIGH (ref 0–200)
HDL: 32.1 mg/dL — ABNORMAL LOW (ref >39.00)
VLDL: 57.2 mg/dL — ABNORMAL HIGH (ref 0.0–40.0)

## 2011-09-09 LAB — TSH: TSH: 0.92 u[IU]/mL (ref 0.35–5.50)

## 2011-09-09 LAB — LDL CHOLESTEROL, DIRECT: Direct LDL: 144.6 mg/dL

## 2011-09-10 ENCOUNTER — Other Ambulatory Visit: Payer: Self-pay | Admitting: *Deleted

## 2011-09-10 DIAGNOSIS — I82409 Acute embolism and thrombosis of unspecified deep veins of unspecified lower extremity: Secondary | ICD-10-CM

## 2011-09-11 ENCOUNTER — Other Ambulatory Visit (HOSPITAL_BASED_OUTPATIENT_CLINIC_OR_DEPARTMENT_OTHER): Payer: Medicare Other | Admitting: Lab

## 2011-09-11 ENCOUNTER — Ambulatory Visit (HOSPITAL_BASED_OUTPATIENT_CLINIC_OR_DEPARTMENT_OTHER): Payer: Medicare Other | Admitting: Oncology

## 2011-09-11 ENCOUNTER — Telehealth: Payer: Self-pay | Admitting: Oncology

## 2011-09-11 ENCOUNTER — Other Ambulatory Visit: Payer: Self-pay | Admitting: *Deleted

## 2011-09-11 VITALS — BP 142/58 | HR 49 | Temp 96.9°F | Ht 64.0 in | Wt 225.1 lb

## 2011-09-11 DIAGNOSIS — I82409 Acute embolism and thrombosis of unspecified deep veins of unspecified lower extremity: Secondary | ICD-10-CM

## 2011-09-11 DIAGNOSIS — C50519 Malignant neoplasm of lower-outer quadrant of unspecified female breast: Secondary | ICD-10-CM

## 2011-09-11 DIAGNOSIS — C50919 Malignant neoplasm of unspecified site of unspecified female breast: Secondary | ICD-10-CM

## 2011-09-11 DIAGNOSIS — C50319 Malignant neoplasm of lower-inner quadrant of unspecified female breast: Secondary | ICD-10-CM

## 2011-09-11 DIAGNOSIS — Z86718 Personal history of other venous thrombosis and embolism: Secondary | ICD-10-CM

## 2011-09-11 DIAGNOSIS — D649 Anemia, unspecified: Secondary | ICD-10-CM

## 2011-09-11 DIAGNOSIS — Z9010 Acquired absence of unspecified breast and nipple: Secondary | ICD-10-CM

## 2011-09-11 LAB — CBC WITH DIFFERENTIAL/PLATELET
Basophils Absolute: 0.1 10*3/uL (ref 0.0–0.1)
EOS%: 4.8 % (ref 0.0–7.0)
HGB: 10.4 g/dL — ABNORMAL LOW (ref 11.6–15.9)
MCH: 26.5 pg (ref 25.1–34.0)
MCV: 82.2 fL (ref 79.5–101.0)
MONO%: 10 % (ref 0.0–14.0)
NEUT%: 72.9 % (ref 38.4–76.8)
RDW: 14 % (ref 11.2–14.5)

## 2011-09-11 LAB — PROTIME-INR
INR: 2.3 (ref 2.00–3.50)
Protime: 27.6 Seconds — ABNORMAL HIGH (ref 10.6–13.4)

## 2011-09-11 NOTE — Progress Notes (Signed)
OFFICE PROGRESS NOTE   INTERVAL HISTORY:   She returns as scheduled. She continues Arimidex. Stable arthralgias. No sign of recurrent venous thrombosis. She reports a recent hospital admission with pulmonary edema. She continues Coumadin.  Objective:  Vital signs in last 24 hours:  Blood pressure 142/58, pulse 49, temperature 96.9 F (36.1 C), temperature source Oral, height 5\' 4"  (1.626 m), weight 225 lb 1.6 oz (102.105 kg).    HEENT: Neck without mass Lymphatics: No cervical, supraclavicular, or axillary nodes Resp: Lungs clear bilaterally Cardio: Regular rate and rhythm GI: No hepatomegaly Vascular: Trace edema at the left greater than right lower leg  Breast: Status post right mastectomy. No evidence for chest wall tumor recurrence. Left breast without mass.   Lab Results:  Lab Results  Component Value Date   WBC 6.5 09/11/2011   HGB 10.4* 09/11/2011   HCT 32.3* 09/11/2011   MCV 82.2 09/11/2011   PLT 254 09/11/2011   ANC 4.7 PT/INR-2.3  Medications: I have reviewed the patient's current medications.  Assessment/Plan: 1. Extensive left lower extremity deep vein thrombosis.  Maintained on anticoagulation therapy since she was diagnosed with a deep venous thrombosis, 01/31/2009.   a. She was treated with Lovenox followed by heparin anticoagulation while hospitalized, and she was then maintained on Lovenox prior to surgery on 03/02/2009.  b. Now maintained on therapeutic Coumadin anticoagulation.   2. Diagnosed with synchronous T1, ER positive, PR positive, and HER-2 negative right-sided breast cancer on 01/09/2009. a. Status post a right mastectomy and sentinel lymph node biopsy on 03/22/2009 with the pathology confirming two foci of invasive carcinoma measuring 0.6 and 0.4 cm.  The invasive tumor was grade 1 with associated low-grade ductal carcinoma in situ.  No lymphovascular invasion was identified, and three right-sided sentinel lymph nodes were negative for metastatic  carcinoma.  b. Initiation of adjuvant Arimidex on 04/05/2009. 3. Chronic obstructive pulmonary disease. 4. History of a cerebrovascular accident. 5. Gout. 6. Obstructive sleep apnea. 7. History of renal insufficiency. 8. Mild anemia-potentially related to chronic renal insufficiency, we will check a CBC when she returns in 2-3 months for a PT check  9. Admission with "flash "pulmonary edema in February of 2013.   Disposition:  She continues Coumadin anticoagulation. She will continue Arimidex. A left mammogram will be scheduled for October of 2013. She will return problems visit in 6 months.   Thornton Papas, MD  09/11/2011  1:19 PM

## 2011-09-11 NOTE — Telephone Encounter (Signed)
gv pt appt schedule for may thru oct. Including mammo 10/8. Confirmed w/sherry @ [facility] pt already on schedule for 10/8.

## 2011-09-15 ENCOUNTER — Encounter: Payer: Self-pay | Admitting: Internal Medicine

## 2011-09-15 ENCOUNTER — Ambulatory Visit (INDEPENDENT_AMBULATORY_CARE_PROVIDER_SITE_OTHER): Payer: Medicare Other | Admitting: Internal Medicine

## 2011-09-15 VITALS — BP 130/82 | HR 76 | Temp 97.3°F | Resp 16 | Wt 223.0 lb

## 2011-09-15 DIAGNOSIS — G4733 Obstructive sleep apnea (adult) (pediatric): Secondary | ICD-10-CM

## 2011-09-15 DIAGNOSIS — I428 Other cardiomyopathies: Secondary | ICD-10-CM

## 2011-09-15 DIAGNOSIS — M353 Polymyalgia rheumatica: Secondary | ICD-10-CM

## 2011-09-15 DIAGNOSIS — N184 Chronic kidney disease, stage 4 (severe): Secondary | ICD-10-CM

## 2011-09-15 DIAGNOSIS — E785 Hyperlipidemia, unspecified: Secondary | ICD-10-CM

## 2011-09-15 DIAGNOSIS — M109 Gout, unspecified: Secondary | ICD-10-CM

## 2011-09-15 DIAGNOSIS — E119 Type 2 diabetes mellitus without complications: Secondary | ICD-10-CM

## 2011-09-15 DIAGNOSIS — I509 Heart failure, unspecified: Secondary | ICD-10-CM

## 2011-09-15 DIAGNOSIS — I4891 Unspecified atrial fibrillation: Secondary | ICD-10-CM

## 2011-09-15 DIAGNOSIS — I5022 Chronic systolic (congestive) heart failure: Secondary | ICD-10-CM | POA: Insufficient documentation

## 2011-09-15 DIAGNOSIS — I10 Essential (primary) hypertension: Secondary | ICD-10-CM

## 2011-09-15 MED ORDER — FLUTICASONE-SALMETEROL 100-50 MCG/DOSE IN AEPB: 1.0000 | INHALATION_SPRAY | Freq: Two times a day (BID) | RESPIRATORY_TRACT | Status: DC

## 2011-09-15 MED ORDER — ALLOPURINOL 100 MG PO TABS: 100.0000 mg | ORAL_TABLET | Freq: Every day | ORAL | Status: DC

## 2011-09-15 NOTE — Assessment & Plan Note (Signed)
Continue with current prescription therapy as reflected on the Med list.  

## 2011-09-15 NOTE — Assessment & Plan Note (Signed)
Doing well on CPAP 

## 2011-09-15 NOTE — Assessment & Plan Note (Signed)
2012-remission Off steroids  

## 2011-09-15 NOTE — Assessment & Plan Note (Signed)
CHF adm in 2/13 Continue with current prescription therapy as reflected on the Med list.

## 2011-09-15 NOTE — Progress Notes (Signed)
[name], female   DOB: Dec 04, 1935, 76 y.o.   MRN: [id number]  Subjective:    [name], female    DOB: [birth year], 76 y.o.   MRN: [id number]  HPI  Hosp f/u -- She presented in Feb, 2013 with acute shortness of breath/pulmonary edema. Her enzymes were positive. She had a heart cath showing mild nonobstructive disease with normal right heart and left ventricular filling pressures. Her echo showed an EF of 25 to 30%. She will be managed medically. Her other issues include CKD, obesity, HTN, sleep apnea, prior DVT, chronic anticoagulation, PAF, diabetes and HLD. She is currently not on ACE or ARB.   The patient presents for a follow-up of  chronic hypertension, chronic dyslipidemia, type 2 diabetes controlled with medicines   Wt Readings from Last 3 Encounters:  09/15/11 223 lb (101.152 kg)  09/11/11 225 lb 1.6 oz (102.105 kg)  09/07/11 224 lb (101.606 kg)   Wt Readings from Last 3 Encounters:  09/15/11 223 lb (101.152 kg)  09/11/11 225 lb 1.6 oz (102.105 kg)  09/07/11 224 lb (101.606 kg)      Review of Systems  Constitutional: Negative for chills, activity change, appetite change, fatigue and unexpected weight change.  HENT: Negative for congestion, mouth sores and sinus pressure.   Eyes: Negative for visual disturbance.  Respiratory: Negative for cough and chest tightness.   Gastrointestinal: Negative for nausea and abdominal pain.  Genitourinary: Negative for frequency, difficulty urinating and vaginal pain.  Musculoskeletal: Positive for back pain, arthralgias and gait problem. Negative for joint swelling.  Skin: Negative for pallor and rash.  Neurological: Negative for dizziness, tremors, weakness, numbness and headaches.  Psychiatric/Behavioral: Negative for suicidal ideas, confusion and sleep disturbance.   BP Readings from Last 3 Encounters:  09/15/11 130/82  09/11/11 142/58  09/07/11 113/48       Objective:   Physical Exam    Constitutional: She appears well-developed. No distress.  HENT:  Head: Normocephalic.  Right Ear: External ear normal.  Left Ear: External ear normal.  Nose: Nose normal.  Mouth/Throat: Oropharynx is clear and moist.  Eyes: Conjunctivae are normal. Pupils are equal, round, and reactive to light. Right eye exhibits no discharge. Left eye exhibits no discharge.  Neck: Normal range of motion. Neck supple. No JVD present. No tracheal deviation present. No thyromegaly present.  Cardiovascular: Normal rate, regular rhythm and normal heart sounds.   Pulmonary/Chest: No stridor. No respiratory distress. She has no wheezes.  Abdominal: Soft. Bowel sounds are normal. She exhibits no distension and no mass. There is no tenderness. There is no rebound and no guarding.  Musculoskeletal: She exhibits no edema and no tenderness.  Lymphadenopathy:    She has no cervical adenopathy.  Neurological: She displays normal reflexes. No cranial nerve deficit. She exhibits normal muscle tone. Coordination normal.       cane  Skin: No rash noted. No erythema.  Psychiatric: She has a normal mood and affect. Her behavior is normal. Judgment and thought content normal.    Lab Results  Component Value Date   WBC 6.5 09/11/2011   HGB 10.4* 09/11/2011   HCT 32.3* 09/11/2011   PLT 254 09/11/2011   GLUCOSE 129* 09/09/2011   CHOL 221* 09/09/2011   TRIG 286.0* 09/09/2011   HDL 32.10* 09/09/2011   LDLDIRECT 144.6 09/09/2011   LDLCALC  Value: 154        Total Cholesterol/HDL:CHD Risk Coronary Heart Disease Risk Table  Men   Women  1/2 Average Risk   3.4   3.3  Average Risk       5.0   4.4  2 X Average Risk   9.6   7.1  3 X Average Risk  23.4   11.0        Use the calculated Patient Ratio above and the CHD Risk Table to determine the patient's CHD Risk.        ATP III CLASSIFICATION (LDL):  <100     mg/dL   Optimal  161-096  mg/dL   Near or Above                    Optimal  130-159  mg/dL   Borderline  045-409   mg/dL   High  >811     mg/dL   Very High* 02/06/7828   ALT 16 09/09/2011   AST 21 09/09/2011   NA 138 09/09/2011   K 4.0 09/09/2011   CL 105 09/09/2011   CREATININE 1.8* 09/09/2011   BUN 24* 09/09/2011   CO2 24 09/09/2011   TSH 0.92 09/09/2011   INR 2.30 09/11/2011   HGBA1C 6.5 09/09/2011   MICROALBUR 3.1* 09/27/2006          Assessment & Plan:

## 2011-09-15 NOTE — Assessment & Plan Note (Addendum)
Continue with current diet.  Chronic - diet controlled now (2013). Aggravated when on steroids.  Lab Results  Component Value Date   HGBA1C 6.5 09/09/2011

## 2011-09-15 NOTE — Assessment & Plan Note (Signed)
Chronic tophaceous Uloric is too $$ Will start allopurinol low dose

## 2011-09-15 NOTE — Assessment & Plan Note (Signed)
Controlled Continue with current prescription therapy as reflected on the Med list.  

## 2011-09-15 NOTE — Assessment & Plan Note (Signed)
Rate controlled 

## 2011-09-15 NOTE — Assessment & Plan Note (Signed)
Statin intolerant 

## 2011-09-29 ENCOUNTER — Other Ambulatory Visit: Payer: Self-pay | Admitting: Oncology

## 2011-09-29 DIAGNOSIS — C50919 Malignant neoplasm of unspecified site of unspecified female breast: Secondary | ICD-10-CM

## 2011-10-09 ENCOUNTER — Other Ambulatory Visit (HOSPITAL_BASED_OUTPATIENT_CLINIC_OR_DEPARTMENT_OTHER): Payer: Medicare Other | Admitting: Lab

## 2011-10-09 DIAGNOSIS — I82409 Acute embolism and thrombosis of unspecified deep veins of unspecified lower extremity: Secondary | ICD-10-CM

## 2011-10-09 DIAGNOSIS — Z5181 Encounter for therapeutic drug level monitoring: Secondary | ICD-10-CM

## 2011-10-09 DIAGNOSIS — D649 Anemia, unspecified: Secondary | ICD-10-CM

## 2011-10-09 DIAGNOSIS — Z7901 Long term (current) use of anticoagulants: Secondary | ICD-10-CM

## 2011-10-09 LAB — CBC WITH DIFFERENTIAL/PLATELET
Basophils Absolute: 0.1 10*3/uL (ref 0.0–0.1)
Eosinophils Absolute: 0.3 10*3/uL (ref 0.0–0.5)
HGB: 10.4 g/dL — ABNORMAL LOW (ref 11.6–15.9)
LYMPH%: 11 % — ABNORMAL LOW (ref 14.0–49.7)
MCV: 81.5 fL (ref 79.5–101.0)
MONO#: 0.5 10*3/uL (ref 0.1–0.9)
MONO%: 6.5 % (ref 0.0–14.0)
NEUT#: 5.5 10*3/uL (ref 1.5–6.5)
Platelets: 254 10*3/uL (ref 145–400)

## 2011-10-09 LAB — PROTIME-INR: Protime: 24 Seconds — ABNORMAL HIGH (ref 10.6–13.4)

## 2011-10-12 ENCOUNTER — Telehealth: Payer: Self-pay | Admitting: *Deleted

## 2011-10-12 DIAGNOSIS — I4891 Unspecified atrial fibrillation: Secondary | ICD-10-CM

## 2011-10-12 DIAGNOSIS — D649 Anemia, unspecified: Secondary | ICD-10-CM

## 2011-10-12 NOTE — Telephone Encounter (Signed)
Message copied by Wandalee Ferdinand on Mon Oct 12, 2011  5:45 PM ------      Message from: Ladene Artist      Created: Sat Oct 10, 2011  6:22 PM       Please call patient, same coumadin      Hb is stable      Check PT, cbc, ferritin in 1 month

## 2011-10-12 NOTE — Telephone Encounter (Signed)
Notified husband to continue same dose of Coumadin and Hgb is stable. Will recheck PT/CBC and ferritin in 1 month.

## 2011-10-20 ENCOUNTER — Ambulatory Visit (INDEPENDENT_AMBULATORY_CARE_PROVIDER_SITE_OTHER): Payer: Medicare Other | Admitting: Cardiovascular Disease

## 2011-10-20 ENCOUNTER — Encounter: Payer: Self-pay | Admitting: Cardiovascular Disease

## 2011-10-20 VITALS — BP 136/60 | HR 57 | Ht 66.0 in | Wt 221.6 lb

## 2011-10-20 DIAGNOSIS — I6350 Cerebral infarction due to unspecified occlusion or stenosis of unspecified cerebral artery: Secondary | ICD-10-CM

## 2011-10-20 DIAGNOSIS — E119 Type 2 diabetes mellitus without complications: Secondary | ICD-10-CM

## 2011-10-20 DIAGNOSIS — I4891 Unspecified atrial fibrillation: Secondary | ICD-10-CM

## 2011-10-20 DIAGNOSIS — R0989 Other specified symptoms and signs involving the circulatory and respiratory systems: Secondary | ICD-10-CM

## 2011-10-20 DIAGNOSIS — I428 Other cardiomyopathies: Secondary | ICD-10-CM

## 2011-10-20 DIAGNOSIS — I10 Essential (primary) hypertension: Secondary | ICD-10-CM

## 2011-10-20 DIAGNOSIS — I639 Cerebral infarction, unspecified: Secondary | ICD-10-CM

## 2011-10-20 NOTE — Progress Notes (Signed)
Wendy Cole is seen back today for a post hospital visit. She is seen for Dr. [place]. She presented last month with acute shortness of breath/pulmonary edema. Her enzymes were positive. She had a heart cath showing mild nonobstructive disease with normal right heart and left ventricular filling pressures. Her echo showed an EF of 25 to 30%. She will be managed medically. Her other issues include CKD, obesity, HTN, sleep apnea, prior DVT, chronic anticoagulation, PAF, diabetes and HLD. She is currently not on ACE or ARB.  She is here today with husband. She says she is doing ok. She is quite limited by her arthritis and had been using lots of NSAIDS prior to this recent admission. She has been home about 2 1/2 weeks. No chest pain. Not really short of breath. Has had issues with swelling. She found an old bottle of Demedex and has been using it occasionally for the swelling. She is now using some Hydrocodone and Tylenol for her gout. She does not have good insight as to the issue at hand. She does not understand the need for salt restriction. Her and her family eat out quite a bit ([employer], [place], etc). She is not weighing daily. She likes to cook and bake. She does not exercise and is quite limited due to diffuse arthritic pain and gout. She was previously on Enalapril which was stopped due to renal insufficiency. She is now on Bidil. Coreg is not a new drug for her.  ROS: Denies fever, malais, weight loss, blurry vision, decreased visual acuity, cough, sputum, SOB, hemoptysis, pleuritic pain, palpitaitons, heartburn, abdominal pain, melena, lower extremity edema, claudication, or rash.  All other systems reviewed and negative  General: Affect appropriate Healthy:  appears stated age HEENT: normal Neck supple with no adenopathy JVP normal left bruits no thyromegaly Lungs clear with no wheezing and good diaphragmatic motion Heart:  S1/S2 no murmur, no rub, gallop or click PMI normal Abdomen:  benighn, BS positve, no tenderness, no AAA no bruit.  No HSM or HJR Distal pulses intact with no bruits No edema Neuro non-focal Skin warm and dry No muscular weakness   Current Outpatient Prescriptions  Medication Sig Dispense Refill  . allopurinol (ZYLOPRIM) 100 MG tablet Take 1 tablet (100 mg total) by mouth daily.  90 tablet  3  . anastrozole (ARIMIDEX) 1 MG tablet TAKE 1 TABLET BY MOUTH ONCE DAILY  90 tablet  1  . BIOTIN PO Take 1 tablet by mouth daily.      . brimonidine (ALPHAGAN) 0.15 % ophthalmic solution Place 1 drop into both eyes daily.       [name] buPROPion (WELLBUTRIN SR) 150 MG 12 hr tablet Take 150 mg by mouth daily.      . calcium carbonate (CALCIUM 500) 1250 MG tablet Take 1 tablet by mouth daily.      . carvedilol (COREG) 12.5 MG tablet Take 1 tablet (12.5 mg total) by mouth 2 (two) times daily with a meal.  62 tablet  3  . clorazepate (TRANXENE) 7.5 MG tablet Take 7.5 mg by mouth 2 (two) times daily as needed. For anxiety      . Fluticasone-Salmeterol (ADVAIR) 100-50 MCG/DOSE AEPB Inhale 1 puff into the lungs every 12 (twelve) hours.  60 each  5  . HYDROcodone-acetaminophen (VICODIN) 5-500 MG per tablet Take 1 tablet by mouth every 6 (six) hours as needed.      [name] levothyroxine (SYNTHROID, LEVOTHROID) 137 MCG tablet Take 137 mcg by mouth daily.      [name]  LORazepam (ATIVAN) 1 MG tablet Take 1 mg by mouth every 8 (eight) hours as needed.      . methocarbamol (ROBAXIN) 500 MG tablet Take 500 mg by mouth as needed.      [name] omeprazole (PRILOSEC) 40 MG capsule Take 40 mg by mouth daily.       . prednisoLONE acetate (PRED FORTE) 1 % ophthalmic suspension Place 1 drop into both eyes daily as needed. For eye pain      . timolol (TIMOPTIC) 0.5 % ophthalmic solution Place 1 drop into both eyes 2 (two) times daily. Twice daily      . torsemide (DEMADEX) 20 MG tablet Take 1 tablet (20 mg total) by mouth daily.  30 tablet  6  . vitamin D, CHOLECALCIFEROL, 400 UNITS tablet Take 400 Units by  mouth daily.       [name] warfarin (COUMADIN) 1 MG tablet Take 2-3 mg by mouth See admin instructions. Takes 3 mg on Mon, and Thurs. Takes 2mg   Tues,Wed, Fri, Sat Sun        Allergies  Atorvastatin; Colesevelam; and Lasix  Electrocardiogram:  Assessment and Plan

## 2011-10-20 NOTE — Assessment & Plan Note (Signed)
Well controlled.  Continue current medications and low sodium Dash type diet.    

## 2011-10-20 NOTE — Assessment & Plan Note (Signed)
Discussed low carb diet.  Target hemoglobin A1c is 6.5 or less.  Continue current medications.  

## 2011-10-20 NOTE — Patient Instructions (Signed)
Your physician has requested that you have a carotid duplex. This test is an ultrasound of the carotid arteries in your neck. It looks at blood flow through these arteries that supply the brain with blood. Allow one hour for this exam. There are no restrictions or special instructions. Your physician wants you to follow-up in: 6 months. You will receive a reminder letter in the mail two months in advance. If you don't receive a letter, please call our office to schedule the follow-up appointment.   

## 2011-10-20 NOTE — Assessment & Plan Note (Signed)
F/U carotid duplex ASA.  ? Related to afib

## 2011-10-20 NOTE — Assessment & Plan Note (Signed)
Stable and euvolemic  F/U MRI or echo in a year

## 2011-10-20 NOTE — Assessment & Plan Note (Signed)
Good rate control and anticoagulation.  F/U coumadin clinic next week

## 2011-10-21 ENCOUNTER — Other Ambulatory Visit: Payer: Self-pay | Admitting: *Deleted

## 2011-10-21 MED ORDER — CLORAZEPATE DIPOTASSIUM 7.5 MG PO TABS: 7.5000 mg | ORAL_TABLET | Freq: Two times a day (BID) | ORAL | Status: DC | PRN

## 2011-10-21 NOTE — Telephone Encounter (Signed)
Yes, 30d ok

## 2011-10-21 NOTE — Telephone Encounter (Signed)
Requesting refill on clorazepate. MD out of office. Is this ok to refill?... 10/21/11@11 :12am/LMB

## 2011-10-21 NOTE — Telephone Encounter (Signed)
Faxed script back to [facility]... 10/21/11@1 :33pm/LMB

## 2011-10-22 ENCOUNTER — Ambulatory Visit (INDEPENDENT_AMBULATORY_CARE_PROVIDER_SITE_OTHER): Payer: Medicare Other | Admitting: Internal Medicine

## 2011-10-22 ENCOUNTER — Encounter: Payer: Self-pay | Admitting: Internal Medicine

## 2011-10-22 VITALS — BP 122/76 | HR 54 | Ht 64.0 in | Wt 221.2 lb

## 2011-10-22 DIAGNOSIS — J449 Chronic obstructive pulmonary disease, unspecified: Secondary | ICD-10-CM

## 2011-10-22 DIAGNOSIS — G4733 Obstructive sleep apnea (adult) (pediatric): Secondary | ICD-10-CM

## 2011-10-22 DIAGNOSIS — I428 Other cardiomyopathies: Secondary | ICD-10-CM

## 2011-10-22 NOTE — Progress Notes (Signed)
  Subjective:    [name], female    DOB: 08-Apr-1936, 76 y.o.   MRN: [id number]  HPI 10/17/10- 22 yoF former smoker followed for OSA and COPD, complicated by DM, hx breast cancer, hx DVT. Here with husband. Last here Oct 17, 2009- note reviewed.  She denies any significant breathing issues since last here. Continues to use CPAP at 10 all night every night. Never recurrence of DVT left leg.  Noted minor cough when first lying down- they are not concerned.   10/22/11- 32 yoF former smoker followed for OSA and COPD, complicated by DM, hx breast cancer, hx DVT.... Here with husband. Denies any SOB, wheezing, cough, or congestion. Wears CPAP every night for 5-6 hours approx. Stable dyspnea on exertion with hills and stairs. Hospitalized from February 18 through 26 with pulmonary edema, history AFib, chronic Coumadin, history of DVT. Ejection fraction 25-30%.  ROS-see HPI Constitutional:   No-   weight loss, night sweats, fevers, chills, fatigue, lassitude. HEENT:   No-  headaches, difficulty swallowing, tooth/dental problems, sore throat,       No-  sneezing, itching, ear ache, nasal congestion, post nasal drip,  CV:  No-   chest pain, orthopnea, PND, swelling in lower extremities, anasarca, dizziness, palpitations Resp: No-   shortness of breath with exertion or at rest.              No-   productive cough,  No non-productive cough,  No- coughing up of blood.              No-   change in color of mucus.  No- wheezing.   Skin: No-   rash or lesions. GI:  No-   heartburn, indigestion, abdominal pain, nausea, vomiting,  GU:  MS:  No-   joint pain or swelling.   Neuro-     nothing unusual Psych:  No- change in mood or affect. No depression or anxiety.  No memory loss.  OBJ- Physical Exam General- Alert, Oriented, Affect-appropriate, Distress- none acute Skin- rash-none, lesions- none, excoriation- none Lymphadenopathy- none Head- atraumatic            Eyes- Gross vision  intact, PERRLA, conjunctivae and secretions clear            Ears- Hearing, canals-normal            Nose- Clear, no-Septal dev, mucus, polyps, erosion, perforation             Throat- Mallampati II , mucosa clear , drainage- none, tonsils- atrophic Neck- flexible , trachea midline, no stridor , thyroid nl, carotid no bruit Chest - symmetrical excursion , unlabored           Heart/CV- RRR , no murmur , no gallop  , no rub, nl s1 s2                           - JVD- none , edema- none, stasis changes- none, varices- none           Lung- clear to P&A, wheeze- none, cough- none , dullness-none, rub- none           Chest wall-  Abd-  Br/ Gen/ Rectal- Not done, not indicated Extrem- cyanosis- none, clubbing, none, atrophy- none, strength- nl Neuro- grossly intact to observation

## 2011-10-22 NOTE — Patient Instructions (Signed)
Order- [facility]- Replacement CPAP mask and supplies   Dx OSA  Please call as needed

## 2011-10-27 NOTE — Assessment & Plan Note (Addendum)
Good compliance and control. Needs new mask. Plan-replacement CPAP mask and supplies.

## 2011-10-27 NOTE — Assessment & Plan Note (Signed)
Not in evident fluid overload at this time.

## 2011-10-27 NOTE — Assessment & Plan Note (Signed)
Recent exacerbation now controlled.

## 2011-11-04 ENCOUNTER — Other Ambulatory Visit: Payer: Self-pay | Admitting: Oncology

## 2011-11-04 DIAGNOSIS — I82409 Acute embolism and thrombosis of unspecified deep veins of unspecified lower extremity: Secondary | ICD-10-CM

## 2011-11-06 ENCOUNTER — Other Ambulatory Visit (HOSPITAL_BASED_OUTPATIENT_CLINIC_OR_DEPARTMENT_OTHER): Payer: Medicare Other | Admitting: Lab

## 2011-11-06 DIAGNOSIS — I82509 Chronic embolism and thrombosis of unspecified deep veins of unspecified lower extremity: Secondary | ICD-10-CM

## 2011-11-06 DIAGNOSIS — D649 Anemia, unspecified: Secondary | ICD-10-CM

## 2011-11-06 DIAGNOSIS — Z5181 Encounter for therapeutic drug level monitoring: Secondary | ICD-10-CM

## 2011-11-06 DIAGNOSIS — I4891 Unspecified atrial fibrillation: Secondary | ICD-10-CM

## 2011-11-06 DIAGNOSIS — Z7901 Long term (current) use of anticoagulants: Secondary | ICD-10-CM

## 2011-11-06 LAB — CBC WITH DIFFERENTIAL/PLATELET
BASO%: 1.3 % (ref 0.0–2.0)
Eosinophils Absolute: 0.3 10*3/uL (ref 0.0–0.5)
LYMPH%: 11.4 % — ABNORMAL LOW (ref 14.0–49.7)
MONO#: 0.5 10*3/uL (ref 0.1–0.9)
NEUT#: 4.4 10*3/uL (ref 1.5–6.5)
Platelets: 295 10*3/uL (ref 145–400)
RBC: 3.91 10*6/uL (ref 3.70–5.45)
WBC: 5.9 10*3/uL (ref 3.9–10.3)
lymph#: 0.7 10*3/uL — ABNORMAL LOW (ref 0.9–3.3)

## 2011-11-06 LAB — PROTIME-INR
INR: 1.8 — ABNORMAL LOW (ref 2.00–3.50)
Protime: 21.6 Seconds — ABNORMAL HIGH (ref 10.6–13.4)

## 2011-11-09 ENCOUNTER — Telehealth: Payer: Self-pay

## 2011-11-09 NOTE — Telephone Encounter (Signed)
Received message from [place] with Dr. Madilyn Fireman wanting to know instructions re: pt's coumadin prior to colonoscopy.  Called her back and informed her per Dr. Truett Perna, pt to hold coumadin 3 days prior to procedure, and resume on day of procedure.  She verbalizes understanding.  Faxed her written order from Dr. Alcide Evener to [phone].

## 2011-11-10 ENCOUNTER — Encounter (INDEPENDENT_AMBULATORY_CARE_PROVIDER_SITE_OTHER): Payer: Medicare Other

## 2011-11-10 ENCOUNTER — Other Ambulatory Visit: Payer: Self-pay | Admitting: *Deleted

## 2011-11-10 DIAGNOSIS — R0989 Other specified symptoms and signs involving the circulatory and respiratory systems: Secondary | ICD-10-CM

## 2011-11-10 DIAGNOSIS — I6529 Occlusion and stenosis of unspecified carotid artery: Secondary | ICD-10-CM

## 2011-11-12 ENCOUNTER — Encounter: Payer: Self-pay | Admitting: Vascular Surgery

## 2011-11-13 ENCOUNTER — Encounter: Payer: Self-pay | Admitting: *Deleted

## 2011-11-13 ENCOUNTER — Other Ambulatory Visit: Payer: Self-pay | Admitting: *Deleted

## 2011-11-13 ENCOUNTER — Encounter: Payer: Self-pay | Admitting: Vascular Surgery

## 2011-11-13 ENCOUNTER — Ambulatory Visit (INDEPENDENT_AMBULATORY_CARE_PROVIDER_SITE_OTHER): Payer: Medicare Other | Admitting: Vascular Surgery

## 2011-11-13 VITALS — BP 150/71 | HR 53 | Resp 16 | Ht 64.0 in | Wt 220.6 lb

## 2011-11-13 DIAGNOSIS — I6529 Occlusion and stenosis of unspecified carotid artery: Secondary | ICD-10-CM

## 2011-11-13 DIAGNOSIS — Z01818 Encounter for other preprocedural examination: Secondary | ICD-10-CM

## 2011-11-13 DIAGNOSIS — I6523 Occlusion and stenosis of bilateral carotid arteries: Secondary | ICD-10-CM | POA: Insufficient documentation

## 2011-11-13 DIAGNOSIS — I658 Occlusion and stenosis of other precerebral arteries: Secondary | ICD-10-CM

## 2011-11-13 NOTE — Progress Notes (Signed)
[facility] [place]  New Carotid Patient  Referred by:  Aleksei V Plotnikov, MD [address] , [place] [address]  Reason for referral: B carotid stenosis  History of Present Illness  Wendy Cole is a 75 y.o. (01/14/1936) female who presents with chief complaint: "clogged arteries".  The patient was evaluated for CAD and had screening carotid duplex done as part of her atherosclerosis screening.  Previous carotid studies demonstrated: RICA 80-99% stenosis, LICA 80-99% stenosis.  Patient has history of L temporal stroke symptom in 2004.  At that point, she describes discoordination and imaging studies consistent with a L CVA.  The patient has never had amaurosis fugax or monocular blindness.  The patient has never had facial drooping or hemiplegia.  The patient has never had receptive or expressive aphasia.   The patient's previous neurologic deficits have resolved.  The patient's risks factors for carotid disease include: DM, hyperlipidemia, and prior smoking.    Past Medical History  Diagnosis Date  . OSA on CPAP   . Anemia   . Anxiety states   . Depressive disorder, not elsewhere classified   . Type II or unspecified type diabetes mellitus without mention of complication, not stated as uncontrolled   . Hyperlipidemia   . Unspecified essential hypertension   . Renal insufficiency   . Osteoarthritis   . Gout   . Iritis   . Nonischemic cardiomyopathy     EF 20 to 25% per echo 06/2011  . Chronic anticoagulation   . NSTEMI (non-ST elevated myocardial infarction) Feb 2013    Mild nonobstructive disease. No LV gram  . PAF (paroxysmal atrial fibrillation)   . Obesity   . Cancer 2010    Right breast  . DVT (deep venous thrombosis) 2010  . Stroke 2004    Left upper lobe  . Pulmonary edema 2013  . Thyroid disease 1969    Three forth of Thyroid removed    Past Surgical History  Procedure Date  . Mastectomy   . Breast  lumpectomy   . Cataract extraction   . Foot surgery   . Mastectomy   . Tonsillectomy 1949  . Abdominal hysterectomy 1974  . Eye surgery 2008    Glaucoma shunt Right eye  . Parotid gland tumor excision 2000  . Cystectomy 1974    Left Breast, chin, left of groin area    History   Social History  . Marital Status: Married    Spouse Name: N/A    Number of Children: N/A  . Years of Education: N/A   Occupational History  . Retired    Social History Main Topics  . Smoking status: Former Smoker -- 2.0 packs/day for 40 years    Types: Cigarettes    Quit date: 05/25/2002  . Smokeless tobacco: Never Used  . Alcohol Use: No  . Drug Use: No  . Sexually Active: Not Currently   Other Topics Concern  . Not on file   Social History Narrative  . No narrative on file    Family History  Problem Relation Age of Onset  . Hypertension    . Heart disease Mother 86    CHF  . Cancer Mother     breast  . Heart disease Father 80  . Heart disease Brother     aaa    Current Outpatient Prescriptions on File Prior to Visit  Medication Sig Dispense Refill  . allopurinol (ZYLOPRIM) 100 MG   tablet Take 1 tablet (100 mg total) by mouth daily.  90 tablet  3  . anastrozole (ARIMIDEX) 1 MG tablet TAKE 1 TABLET BY MOUTH ONCE DAILY  90 tablet  1  . BIOTIN PO Take 1 tablet by mouth daily.      . brimonidine (ALPHAGAN) 0.15 % ophthalmic solution Place 1 drop into both eyes daily.       . buPROPion (WELLBUTRIN SR) 150 MG 12 hr tablet Take 150 mg by mouth daily.      . calcium carbonate (CALCIUM 500) 1250 MG tablet Take 1 tablet by mouth daily.      . carvedilol (COREG) 12.5 MG tablet Take 1 tablet (12.5 mg total) by mouth 2 (two) times daily with a meal.  62 tablet  3  . clorazepate (TRANXENE) 7.5 MG tablet Take 1 tablet (7.5 mg total) by mouth 2 (two) times daily as needed for anxiety.  30 tablet  0  . Fluticasone-Salmeterol (ADVAIR) 100-50 MCG/DOSE AEPB Inhale 1 puff into the lungs every 12  (twelve) hours.  60 each  5  . HYDROcodone-acetaminophen (VICODIN) 5-500 MG per tablet Take 1 tablet by mouth every 6 (six) hours as needed.      . levothyroxine (SYNTHROID, LEVOTHROID) 137 MCG tablet Take 137 mcg by mouth daily.      . LORazepam (ATIVAN) 1 MG tablet Take 1 mg by mouth every 8 (eight) hours as needed.      . methocarbamol (ROBAXIN) 500 MG tablet Take 500 mg by mouth as needed.      . omeprazole (PRILOSEC) 40 MG capsule Take 40 mg by mouth daily.       . prednisoLONE acetate (PRED FORTE) 1 % ophthalmic suspension Place 1 drop into both eyes daily as needed. For eye pain      . timolol (TIMOPTIC) 0.5 % ophthalmic solution Place 1 drop into both eyes 2 (two) times daily. Twice daily      . torsemide (DEMADEX) 20 MG tablet Take 1 tablet (20 mg total) by mouth daily.  30 tablet  6  . vitamin D, CHOLECALCIFEROL, 400 UNITS tablet Take 400 Units by mouth daily.       . warfarin (COUMADIN) 1 MG tablet TAKE 2 TABLETS ON MON & THURS AND 3 TABLETS ON ALL OTHER DAYS OR AS DIRECTED  76 tablet  6    Allergies  Allergen Reactions  . Atorvastatin Other (See Comments)    Muscle aches  . Colesevelam Other (See Comments)    unknown  . Lasix (Furosemide)     REVIEW OF SYSTEMS:  (Positives checked otherwise negative)  CARDIOVASCULAR: [ ] chest pain    [ ] chest pressure    [x] palpitations    [ ] orthopnea   [ ] dyspnea on exert. [ ] claudication    [x] swelling in legs  [x] DVT     [x] varicose veins  PULMONARY:    [ ] productive cough [ ] asthma  [ ] wheezing  NEUROLOGIC:    [ ] weakness    [ ] paresthesias   [ ] aphasia    [ ] amaurosis    [ ] dizziness  HEMATOLOGIC:    [ ] bleeding problems  [ ] clotting disorders  MUSCULOSKEL: [ ] joint pain     [ ] joint swelling  GASTROINTEST:  [ ]  blood in stool   [ ]  hematemesis  GENITOURINARY:   [ ]  dysuria    [ ]    hematuria  PSYCHIATRIC:   [ ] history of major depression  INTEGUMENTARY: [ ] rashes    [ ] ulcers  CONSTITUTIONAL:  [ ]  fever     [ ] chills  Physical Examination  Filed Vitals:   11/13/11 1214  BP: 150/71  Pulse: 53  Resp: 16  Height: 5' 4" (1.626 m)  Weight: 220 lb 9.6 oz (100.064 kg)  SpO2: 98%   Body mass index is 37.87 kg/(m^2).  General: A&O x 3, WDWN ,obese  Head: [place]/AT  Ear/Nose/Throat: Hearing grossly intact, nares w/o erythema or drainage, oropharynx w. Erythema w/o Exudate  Eyes: PERRLA, EOMI  Neck: Supple, no nuchal rigidity, no palpable LAD  Pulmonary: Sym exp, good air movt, CTAB, no rales, rhonchi, & wheezing  Cardiac: RRR, Nl S1, S2, no Murmurs, rubs or gallops  Vascular: Vessel Right Left  Radial Palpable Palpable  Brachial Palpable Palpable  Carotid Palpable, without bruit Palpable, without bruit  Aorta Non-palpable due to obesity N/A  Femoral Faintly Palpable due to pannus Faintly Palpable due to pannus  Popliteal Non-palpable Non-palpable  PT Faintly Palpable Faintly Palpable  DP Non-Palpable Non-Palpable   Gastrointestinal: soft, NTND, -G/R, - HSM, - masses, - CVAT B, obese  Musculoskeletal: M/S 5/5 throughout , Extremities without ischemic changes   Neurologic: CN 2-12 intact , Pain and light touch intact in extremities , Motor exam as listed above  Psychiatric: Judgment intact, Mood & affect appropriate for pt's clinical situation  Dermatologic: See M/S exam for extremity exam, no rashes otherwise noted  Lymph : No Cervical, Axillary, or Inguinal lymphadenopathy   Non-Invasive Vascular Imaging  Outside CAROTID DUPLEX (Date: 11/10/11):   R ICA stenosis: 80-99%, PSV 529/140 c/s  R VA: patent and antegrade  L ICA stenosis: 80-99%, PSV 672/235 c/s  L VA: patent and antegrade  Outside Studies/Documentation  3 pages of outside documents were reviewed including: outside carotid duplex and cardiology clinic chart  Medical Decision Making  Wendy Cole is a 75 y.o. female who presents with: asx severe B ICA stenosis   Given previous h/o CVA on  L and higher velocities on this side, I have offered the patient: L CEA.  The R ICA will need a R CEA in 4-6 weeks after the L. I discussed with the patient the risks, benefits, and alternatives to carotid endarterectomy.  It is not clear if the patient is a candidate for carotid artery stenting.  Given her Cr 1.8, she is not a candidate for CTA Neck due to the nephrotoxicity concerns. Also CREST demonstrates a increased stroke rate in the > 70 year old population, so if she is an acceptable risk for CEA, I would recommend such. Risk stratification from Dr. Nishan will help with the final decision making in this case. As a precaution, I discussed the procedural details of carotid endarterectomy with the patient. The patient is aware that the risks of carotid endarterectomy include but are not limited to: bleeding, infection, stroke, myocardial infarction, death, cranial nerve injuries both temporary and permanent, neck hematoma, possible airway compromise, labile blood pressure post-operatively, cerebral hyperperfusion syndrome, and possible need for additional interventions in the future. The patient is aware of the risks and agrees to proceed forward with the procedure.  I discussed in depth with the patient the nature of atherosclerosis, and emphasized the importance of maximal medical management including strict control of blood pressure, blood glucose, and lipid levels, obtaining regular exercise, antiplatelet agents, and cessation of smoking.  The patient   is aware that without maximal medical management the underlying atherosclerotic disease process will progress, limiting the benefit of any interventions.  As long as there are no cardiac contraindication, the patient is tenatively scheduled for the 26 JUN 13.  She will need to hold her coumadin (distant h/o DVT) prior to surgery.  A drain maybe needed if she demonstrates significant bleeding inatraoperatively.  Thank you for allowing us to  participate in this patient's care.  Mykael Trott, MD [facility] [place] Office: [phone] Pager: [phone]  11/13/2011, 2:04 PM        

## 2011-11-16 ENCOUNTER — Encounter (HOSPITAL_COMMUNITY): Payer: Self-pay

## 2011-11-16 ENCOUNTER — Encounter (HOSPITAL_COMMUNITY): Payer: Self-pay | Admitting: Pharmacy Technician

## 2011-11-16 ENCOUNTER — Encounter (HOSPITAL_COMMUNITY)
Admission: RE | Admit: 2011-11-16 | Discharge: 2011-11-16 | Disposition: A | Discharge status: Home / Self Care | Payer: Medicare Other | Source: Ambulatory Visit | Attending: Vascular Surgery | Admitting: Vascular Surgery

## 2011-11-16 ENCOUNTER — Encounter (HOSPITAL_COMMUNITY)
Admission: RE | Admit: 2011-11-16 | Discharge: 2011-11-16 | Disposition: A | Discharge status: Home / Self Care | Payer: Medicare Other | Source: Ambulatory Visit | Attending: Anesthesiology | Admitting: Anesthesiology

## 2011-11-16 HISTORY — DX: Adverse effect of unspecified anesthetic, initial encounter: T41.45XA

## 2011-11-16 HISTORY — DX: Other complications of anesthesia, initial encounter: T88.59XA

## 2011-11-16 HISTORY — DX: Unspecified hearing loss, unspecified ear: H91.90

## 2011-11-16 LAB — CBC
HCT: 33.9 % — ABNORMAL LOW (ref 36.0–46.0)
MCV: 85.4 fL (ref 78.0–100.0)
RBC: 3.97 MIL/uL (ref 3.87–5.11)
WBC: 6.7 10*3/uL (ref 4.0–10.5)

## 2011-11-16 LAB — URINALYSIS, ROUTINE W REFLEX MICROSCOPIC
Ketones, ur: NEGATIVE mg/dL
Leukocytes, UA: NEGATIVE
Nitrite: NEGATIVE
Urobilinogen, UA: 0.2 mg/dL (ref 0.0–1.0)
pH: 6 (ref 5.0–8.0)

## 2011-11-16 LAB — SURGICAL PCR SCREEN: Staphylococcus aureus: NEGATIVE

## 2011-11-16 LAB — COMPREHENSIVE METABOLIC PANEL
BUN: 30 mg/dL — ABNORMAL HIGH (ref 6–23)
CO2: 20 mEq/L (ref 19–32)
Chloride: 107 mEq/L (ref 96–112)
Creatinine, Ser: 1.57 mg/dL — ABNORMAL HIGH (ref 0.50–1.10)
GFR calc non Af Amer: 32 mL/min — ABNORMAL LOW (ref >90)
Glucose, Bld: 108 mg/dL — ABNORMAL HIGH (ref 70–99)
Total Bilirubin: 0.2 mg/dL — ABNORMAL LOW (ref 0.3–1.2)

## 2011-11-16 LAB — URINE MICROSCOPIC-ADD ON

## 2011-11-16 NOTE — Pre-Procedure Instructions (Signed)
20 ADRIANNAH STEINKAMP  11/16/2011   Your procedure is scheduled on:  Wednesday June 26  Report to [facility] at 6:30 AM.  Call this number if you have problems the morning of surgery: [phone]   Remember:   Do not eat food:After Midnight.  May have clear liquids:until Midnight .  Clear liquids include soda, tea, black coffee, apple or grape juice, broth.  Take these medicines the morning of surgery with A SIP OF WATER: Anastrazole, Carvedilol, Wellbutrin, Amlodipine, Synthroid, Prilosec.  Vicodin, Robaxin, Lorazepam if needed. Use Advair. May use eye drops.   Do not wear jewelry, make-up or nail polish.  Do not wear lotions, powders, or perfumes. You may wear deodorant.  Do not shave 48 hours prior to surgery. Men may shave face and neck.  Do not bring valuables to the hospital.  Contacts, dentures or bridgework may not be worn into surgery.  Leave suitcase in the car. After surgery it may be brought to your room.  For patients admitted to the hospital, checkout time is 11:00 AM the day of discharge.   Patients discharged the day of surgery will not be allowed to drive home.  Name and phone number of your driver: NA  Special Instructions: CHG Shower Use Special Wash: 1/2 bottle night before surgery and 1/2 bottle morning of surgery.   Please read over the following fact sheets that you were given: Pain Booklet, Coughing and Deep Breathing, Blood Transfusion Information and Surgical Site Infection Prevention

## 2011-11-16 NOTE — Progress Notes (Signed)
Pt stopped coumadin on 11/14/11.

## 2011-11-17 ENCOUNTER — Ambulatory Visit: Payer: Medicare Other | Admitting: Physician Assistant

## 2011-11-17 MED ORDER — DEXTROSE 5 % IV SOLN
1.5000 g | INTRAVENOUS | Status: AC
  Administered 2011-11-18: 1.5 g via INTRAVENOUS
  Filled 2011-11-17: qty 1.5

## 2011-11-17 NOTE — Consult Note (Addendum)
Anesthesia Chart Review:  Patient is a 76 year old female scheduled for left CEA on 11/18/11.  History includes hospitalization in 06/2011 for flash pulmonary edema and diagnosed with afib/PAF, mild non-obstructive CAD by 06/2011 cath, and nonischemic CM.  She is also treated for DM2, HLD, former smoker, obesity with BMI 38, OSA with CPAP (Dr. Maple Hudson), anemia, anxiety, depression, CKD Stage 4, HTN, OA, gout, DVT '10, CVA '04, right breast CA '10.  PCP is Dr. Posey Rea.  Oncologist is Dr. Truett Perna.   By notes, Carotid duplex on 11/10/11 showed bilateral 80-99% ICA stenosis.  She will need staged bilateral CEA.  Dr. Swaziland is her primary Cardiologist (last saw Dr. Eden Emms). Dr. Swaziland actually referred her to [facility] for surgical evaluation.  Currently her last EKG is from 07/14/11 and shows NSR, possible LAE, LVH, inferior infarct (age undetermined), lateral T wave abnormality.  Cardiac cath on 07/20/11 showed: 1. Mild nonobstructive atherosclerotic coronary disease (LM 10%, LAD <20%, DIAG 20-30%, LCx <20%, RCA 10-20%).  2. Normal right heart and left ventricular filling pressures.  3. Slight aortic valve gradient.  Recommendations: Recommend continued medical therapy for her cardiomyopathy.  (Dr. Swaziland)  Echo on 07/13/11 showed: - Left ventricle: Systolic function was severely reduced. The estimated ejection fraction was in the range of 25% to 30%. Features are consistent with a pseudonormal left ventricular filling pattern, with concomitant abnormal relaxation and increased filling pressure (grade 2 diastolic dysfunction). Doppler parameters are consistent with elevated mean left atrial filling pressure. - Regional wall motion abnormality: Akinesis of the basal-mid anteroseptal myocardium; hypokinesis of the entire anterior, mid inferolateral, basal-mid anterolateral, and apical lateral myocardium. - Aortic valve: Mild regurgitation. - Mitral valve: Mild regurgitation. - Left atrium: The atrium was  moderately dilated. - Right atrium: The atrium was mildly dilated. - Pulmonary arteries: PA peak pressure: 49mm Hg (S). Impressions: - The right ventricular systolic pressure was increased consistent with mild pulmonary hypertension.  2V CXR on 11/16/11 showed: 1. No radiographic evidence of acute cardiopulmonary disease.  2. Mild hyperexpansion of the lungs is similar to prior  examinations.  3. Mild cardiomegaly.  4. Atherosclerosis.  5. Scarring in the lingula is unchanged.  Labs noted.  Cr 1.57, BUN 30, glucose 108, H/H 10.9/33.9.  PT/PTT mildly elevated.  Will repeat on arrival.  She was seen by Cardiology within the last three months.  Dr. Swaziland is the one who actually referred her for surgical evaluation.  If no acute CV/CHF symptoms then anticipate she can proceed as planned.  Shonna Chock, PA-C 11/17/11 1350

## 2011-11-18 ENCOUNTER — Encounter (HOSPITAL_COMMUNITY): Payer: Self-pay | Admitting: *Deleted

## 2011-11-18 ENCOUNTER — Encounter (HOSPITAL_COMMUNITY): Payer: Self-pay | Admitting: Vascular Surgery

## 2011-11-18 ENCOUNTER — Encounter (HOSPITAL_COMMUNITY)
Admission: RE | Disposition: A | Discharge status: Home Health | Payer: Self-pay | Source: Ambulatory Visit | Attending: Vascular Surgery

## 2011-11-18 ENCOUNTER — Inpatient Hospital Stay (HOSPITAL_COMMUNITY)
Admission: RE | Admit: 2011-11-18 | Discharge: 2011-11-20 | DRG: 038 | Disposition: A | Discharge status: Home Health | Payer: Medicare Other | Source: Ambulatory Visit | Attending: Vascular Surgery | Admitting: Vascular Surgery

## 2011-11-18 ENCOUNTER — Ambulatory Visit (HOSPITAL_COMMUNITY): Payer: Medicare Other | Admitting: Vascular Surgery

## 2011-11-18 ENCOUNTER — Encounter (HOSPITAL_COMMUNITY): Payer: Self-pay | Admitting: Anesthesiology

## 2011-11-18 DIAGNOSIS — I658 Occlusion and stenosis of other precerebral arteries: Secondary | ICD-10-CM | POA: Diagnosis present

## 2011-11-18 DIAGNOSIS — I428 Other cardiomyopathies: Secondary | ICD-10-CM | POA: Diagnosis present

## 2011-11-18 DIAGNOSIS — I252 Old myocardial infarction: Secondary | ICD-10-CM

## 2011-11-18 DIAGNOSIS — J4489 Other specified chronic obstructive pulmonary disease: Secondary | ICD-10-CM | POA: Diagnosis present

## 2011-11-18 DIAGNOSIS — C50919 Malignant neoplasm of unspecified site of unspecified female breast: Secondary | ICD-10-CM | POA: Diagnosis present

## 2011-11-18 DIAGNOSIS — I509 Heart failure, unspecified: Secondary | ICD-10-CM

## 2011-11-18 DIAGNOSIS — I6529 Occlusion and stenosis of unspecified carotid artery: Principal | ICD-10-CM | POA: Diagnosis present

## 2011-11-18 DIAGNOSIS — F329 Major depressive disorder, single episode, unspecified: Secondary | ICD-10-CM | POA: Diagnosis present

## 2011-11-18 DIAGNOSIS — Z86718 Personal history of other venous thrombosis and embolism: Secondary | ICD-10-CM

## 2011-11-18 DIAGNOSIS — M109 Gout, unspecified: Secondary | ICD-10-CM | POA: Diagnosis present

## 2011-11-18 DIAGNOSIS — Z8673 Personal history of transient ischemic attack (TIA), and cerebral infarction without residual deficits: Secondary | ICD-10-CM

## 2011-11-18 DIAGNOSIS — Z01818 Encounter for other preprocedural examination: Secondary | ICD-10-CM

## 2011-11-18 DIAGNOSIS — G4733 Obstructive sleep apnea (adult) (pediatric): Secondary | ICD-10-CM | POA: Diagnosis present

## 2011-11-18 DIAGNOSIS — N289 Disorder of kidney and ureter, unspecified: Secondary | ICD-10-CM | POA: Diagnosis present

## 2011-11-18 DIAGNOSIS — F411 Generalized anxiety disorder: Secondary | ICD-10-CM | POA: Diagnosis present

## 2011-11-18 DIAGNOSIS — Z01812 Encounter for preprocedural laboratory examination: Secondary | ICD-10-CM

## 2011-11-18 DIAGNOSIS — J449 Chronic obstructive pulmonary disease, unspecified: Secondary | ICD-10-CM | POA: Diagnosis present

## 2011-11-18 DIAGNOSIS — E119 Type 2 diabetes mellitus without complications: Secondary | ICD-10-CM | POA: Diagnosis present

## 2011-11-18 DIAGNOSIS — Z79899 Other long term (current) drug therapy: Secondary | ICD-10-CM

## 2011-11-18 DIAGNOSIS — E785 Hyperlipidemia, unspecified: Secondary | ICD-10-CM | POA: Diagnosis present

## 2011-11-18 DIAGNOSIS — I4891 Unspecified atrial fibrillation: Secondary | ICD-10-CM | POA: Diagnosis present

## 2011-11-18 DIAGNOSIS — F3289 Other specified depressive episodes: Secondary | ICD-10-CM | POA: Diagnosis present

## 2011-11-18 DIAGNOSIS — M1990 Unspecified osteoarthritis, unspecified site: Secondary | ICD-10-CM | POA: Diagnosis present

## 2011-11-18 DIAGNOSIS — Z7901 Long term (current) use of anticoagulants: Secondary | ICD-10-CM

## 2011-11-18 DIAGNOSIS — I10 Essential (primary) hypertension: Secondary | ICD-10-CM | POA: Diagnosis present

## 2011-11-18 DIAGNOSIS — Z87891 Personal history of nicotine dependence: Secondary | ICD-10-CM

## 2011-11-18 DIAGNOSIS — K219 Gastro-esophageal reflux disease without esophagitis: Secondary | ICD-10-CM | POA: Diagnosis present

## 2011-11-18 DIAGNOSIS — I6523 Occlusion and stenosis of bilateral carotid arteries: Secondary | ICD-10-CM

## 2011-11-18 DIAGNOSIS — D649 Anemia, unspecified: Secondary | ICD-10-CM | POA: Diagnosis present

## 2011-11-18 HISTORY — PX: ENDARTERECTOMY: SHX5162

## 2011-11-18 HISTORY — PX: CAROTID ENDARTERECTOMY: SUR193

## 2011-11-18 LAB — CBC
MCH: 27.5 pg (ref 26.0–34.0)
Platelets: 235 10*3/uL (ref 150–400)
RBC: 3.46 MIL/uL — ABNORMAL LOW (ref 3.87–5.11)

## 2011-11-18 LAB — CREATININE, SERUM
Creatinine, Ser: 1.54 mg/dL — ABNORMAL HIGH (ref 0.50–1.10)
GFR calc non Af Amer: 32 mL/min — ABNORMAL LOW (ref >90)

## 2011-11-18 LAB — GLUCOSE, CAPILLARY
Glucose-Capillary: 126 mg/dL — ABNORMAL HIGH (ref 70–99)
Glucose-Capillary: 126 mg/dL — ABNORMAL HIGH (ref 70–99)

## 2011-11-18 SURGERY — ENDARTERECTOMY, CAROTID
Anesthesia: General | Site: Neck | Laterality: Left | Wound class: Clean

## 2011-11-18 MED ORDER — NITROGLYCERIN IN D5W 200-5 MCG/ML-% IV SOLN
2.0000 ug/min | INTRAVENOUS | Status: DC
  Administered 2011-11-18: 83 ug/min via INTRAVENOUS
  Administered 2011-11-18: 83.333 ug/min via INTRAVENOUS
  Filled 2011-11-18 (×2): qty 250

## 2011-11-18 MED ORDER — BUPROPION HCL ER (SR) 150 MG PO TB12
150.0000 mg | ORAL_TABLET | Freq: Every day | ORAL | Status: DC
  Administered 2011-11-19 – 2011-11-20 (×2): 150 mg via ORAL
  Filled 2011-11-18 (×2): qty 1

## 2011-11-18 MED ORDER — ACETAMINOPHEN 325 MG PO TABS: 325.0000 mg | ORAL_TABLET | ORAL | Status: DC | PRN

## 2011-11-18 MED ORDER — PHENOL 1.4 % MT LIQD: 1.0000 | OROMUCOSAL | Status: DC | PRN

## 2011-11-18 MED ORDER — POTASSIUM CHLORIDE CRYS ER 20 MEQ PO TBCR: 20.0000 meq | EXTENDED_RELEASE_TABLET | Freq: Once | ORAL | Status: AC | PRN

## 2011-11-18 MED ORDER — WARFARIN - PHYSICIAN DOSING INPATIENT: Freq: Every day | Status: DC

## 2011-11-18 MED ORDER — SENNOSIDES-DOCUSATE SODIUM 8.6-50 MG PO TABS
1.0000 | ORAL_TABLET | Freq: Every evening | ORAL | Status: DC | PRN
  Filled 2011-11-18: qty 1

## 2011-11-18 MED ORDER — PROTAMINE SULFATE 10 MG/ML IV SOLN
INTRAVENOUS | Status: DC | PRN
  Administered 2011-11-18: 10 mg via INTRAVENOUS
  Administered 2011-11-18 (×2): 5 mg via INTRAVENOUS
  Administered 2011-11-18 (×2): 10 mg via INTRAVENOUS

## 2011-11-18 MED ORDER — MAGNESIUM SULFATE 40 MG/ML IJ SOLN
2.0000 g | Freq: Once | INTRAMUSCULAR | Status: AC | PRN
  Filled 2011-11-18: qty 50

## 2011-11-18 MED ORDER — METOPROLOL TARTRATE 1 MG/ML IV SOLN: 2.0000 mg | INTRAVENOUS | Status: DC | PRN

## 2011-11-18 MED ORDER — CALCIUM CARBONATE 1250 (500 CA) MG PO TABS
1.0000 | ORAL_TABLET | Freq: Every day | ORAL | Status: DC
  Administered 2011-11-19 – 2011-11-20 (×2): 500 mg via ORAL
  Filled 2011-11-18 (×2): qty 1

## 2011-11-18 MED ORDER — LIDOCAINE HCL 1 % IJ SOLN
INTRAMUSCULAR | Status: DC | PRN
  Administered 2011-11-18: 60 mg via INTRADERMAL

## 2011-11-18 MED ORDER — SODIUM CHLORIDE 0.9 % IV SOLN: INTRAVENOUS | Status: DC

## 2011-11-18 MED ORDER — FLUTICASONE-SALMETEROL 100-50 MCG/DOSE IN AEPB
1.0000 | INHALATION_SPRAY | Freq: Two times a day (BID) | RESPIRATORY_TRACT | Status: DC
  Administered 2011-11-18 – 2011-11-19 (×2): 1 via RESPIRATORY_TRACT
  Filled 2011-11-18: qty 14

## 2011-11-18 MED ORDER — MORPHINE SULFATE 2 MG/ML IJ SOLN
2.0000 mg | INTRAMUSCULAR | Status: DC | PRN
  Administered 2011-11-18 (×2): 2 mg via INTRAVENOUS
  Administered 2011-11-18: 4 mg via INTRAVENOUS
  Administered 2011-11-18: 2 mg via INTRAVENOUS
  Administered 2011-11-19 (×2): 4 mg via INTRAVENOUS
  Filled 2011-11-18 (×3): qty 1
  Filled 2011-11-18 (×3): qty 2

## 2011-11-18 MED ORDER — HYDRALAZINE HCL 20 MG/ML IJ SOLN
10.0000 mg | INTRAMUSCULAR | Status: DC | PRN
  Administered 2011-11-18: 10 mg via INTRAVENOUS
  Administered 2011-11-18: 16:00:00 via INTRAVENOUS
  Administered 2011-11-20: 10 mg via INTRAVENOUS
  Filled 2011-11-18 (×2): qty 1

## 2011-11-18 MED ORDER — SODIUM CHLORIDE 0.9 % IJ SOLN
INTRAMUSCULAR | Status: AC
  Filled 2011-11-18: qty 10

## 2011-11-18 MED ORDER — SODIUM CHLORIDE 0.9 % IR SOLN
Status: DC | PRN
  Administered 2011-11-18: 10:00:00

## 2011-11-18 MED ORDER — EPHEDRINE SULFATE 50 MG/ML IJ SOLN
INTRAMUSCULAR | Status: DC | PRN
  Administered 2011-11-18: 5 mg via INTRAVENOUS
  Administered 2011-11-18: 10 mg via INTRAVENOUS
  Administered 2011-11-18: 15 mg via INTRAVENOUS
  Administered 2011-11-18 (×2): 5 mg via INTRAVENOUS

## 2011-11-18 MED ORDER — LORAZEPAM 1 MG PO TABS: 1.0000 mg | ORAL_TABLET | Freq: Three times a day (TID) | ORAL | Status: DC | PRN

## 2011-11-18 MED ORDER — THROMBIN 20000 UNITS EX KIT
PACK | CUTANEOUS | Status: DC | PRN
  Administered 2011-11-18: 11:00:00 via TOPICAL

## 2011-11-18 MED ORDER — DOCUSATE SODIUM 100 MG PO CAPS: 100.0000 mg | ORAL_CAPSULE | Freq: Every day | ORAL | Status: DC

## 2011-11-18 MED ORDER — PREDNISOLONE ACETATE 1 % OP SUSP: 1.0000 [drp] | Freq: Every day | OPHTHALMIC | Status: DC | PRN

## 2011-11-18 MED ORDER — TORSEMIDE 20 MG PO TABS
20.0000 mg | ORAL_TABLET | Freq: Every day | ORAL | Status: DC
  Administered 2011-11-18 – 2011-11-20 (×3): 20 mg via ORAL
  Filled 2011-11-18 (×3): qty 1

## 2011-11-18 MED ORDER — HYDRALAZINE HCL 20 MG/ML IJ SOLN
INTRAMUSCULAR | Status: AC
  Filled 2011-11-18: qty 1

## 2011-11-18 MED ORDER — BRIMONIDINE TARTRATE 0.2 % OP SOLN
1.0000 [drp] | Freq: Every day | OPHTHALMIC | Status: DC
  Administered 2011-11-19 – 2011-11-20 (×2): 1 [drp] via OPHTHALMIC
  Filled 2011-11-18: qty 5

## 2011-11-18 MED ORDER — THROMBIN 20000 UNITS EX SOLR
CUTANEOUS | Status: AC
  Filled 2011-11-18: qty 20000

## 2011-11-18 MED ORDER — THROMBIN 20000 UNITS EX SOLR
CUTANEOUS | Status: DC | PRN
  Administered 2011-11-18: 20000 [IU] via TOPICAL

## 2011-11-18 MED ORDER — HYDRALAZINE HCL 20 MG/ML IJ SOLN
INTRAMUSCULAR | Status: DC | PRN
  Administered 2011-11-18 (×2): 5 mg via INTRAVENOUS

## 2011-11-18 MED ORDER — HYDROMORPHONE HCL PF 1 MG/ML IJ SOLN: 0.2500 mg | INTRAMUSCULAR | Status: DC | PRN

## 2011-11-18 MED ORDER — NITROGLYCERIN IN D5W 200-5 MCG/ML-% IV SOLN: 2.0000 ug/min | INTRAVENOUS | Status: DC

## 2011-11-18 MED ORDER — PANTOPRAZOLE SODIUM 40 MG PO TBEC
40.0000 mg | DELAYED_RELEASE_TABLET | Freq: Every day | ORAL | Status: DC
  Administered 2011-11-19 – 2011-11-20 (×2): 40 mg via ORAL
  Filled 2011-11-18 (×2): qty 1

## 2011-11-18 MED ORDER — LIDOCAINE HCL 4 % MT SOLN
OROMUCOSAL | Status: DC | PRN
  Administered 2011-11-18: 4 mL via TOPICAL

## 2011-11-18 MED ORDER — ASPIRIN EC 325 MG PO TBEC
325.0000 mg | DELAYED_RELEASE_TABLET | Freq: Every day | ORAL | Status: DC
  Administered 2011-11-19 – 2011-11-20 (×2): 325 mg via ORAL
  Filled 2011-11-18 (×2): qty 1

## 2011-11-18 MED ORDER — PROPOFOL 10 MG/ML IV EMUL
INTRAVENOUS | Status: DC | PRN
  Administered 2011-11-18: 120 mg via INTRAVENOUS

## 2011-11-18 MED ORDER — DEXTRAN 40 IN SALINE 10-0.9 % IV SOLN
INTRAVENOUS | Status: AC
  Filled 2011-11-18: qty 500

## 2011-11-18 MED ORDER — TIMOLOL MALEATE 0.5 % OP SOLN
1.0000 [drp] | Freq: Two times a day (BID) | OPHTHALMIC | Status: DC
  Administered 2011-11-18 – 2011-11-20 (×4): 1 [drp] via OPHTHALMIC
  Filled 2011-11-18: qty 5

## 2011-11-18 MED ORDER — KETOROLAC TROMETHAMINE 30 MG/ML IJ SOLN: 15.0000 mg | Freq: Once | INTRAMUSCULAR | Status: DC | PRN

## 2011-11-18 MED ORDER — CLORAZEPATE DIPOTASSIUM 7.5 MG PO TABS
7.5000 mg | ORAL_TABLET | Freq: Two times a day (BID) | ORAL | Status: DC
  Administered 2011-11-18 – 2011-11-20 (×4): 7.5 mg via ORAL
  Filled 2011-11-18 (×4): qty 2

## 2011-11-18 MED ORDER — LABETALOL HCL 5 MG/ML IV SOLN
10.0000 mg | INTRAVENOUS | Status: DC | PRN
  Administered 2011-11-18 – 2011-11-20 (×3): 10 mg via INTRAVENOUS
  Filled 2011-11-18 (×2): qty 4

## 2011-11-18 MED ORDER — NITROGLYCERIN IN D5W 200-5 MCG/ML-% IV SOLN
INTRAVENOUS | Status: DC | PRN
  Administered 2011-11-18: 10 ug/min via INTRAVENOUS

## 2011-11-18 MED ORDER — LACTATED RINGERS IV SOLN
INTRAVENOUS | Status: DC | PRN
  Administered 2011-11-18 (×2): via INTRAVENOUS

## 2011-11-18 MED ORDER — GLYCOPYRROLATE 0.2 MG/ML IJ SOLN
INTRAMUSCULAR | Status: DC | PRN
  Administered 2011-11-18: 0.2 mg via INTRAVENOUS
  Administered 2011-11-18: .6 mg via INTRAVENOUS

## 2011-11-18 MED ORDER — 0.9 % SODIUM CHLORIDE (POUR BTL) OPTIME
TOPICAL | Status: DC | PRN
  Administered 2011-11-18: 2000 mL

## 2011-11-18 MED ORDER — ALLOPURINOL 100 MG PO TABS
100.0000 mg | ORAL_TABLET | Freq: Every day | ORAL | Status: DC
  Administered 2011-11-19 – 2011-11-20 (×2): 100 mg via ORAL
  Filled 2011-11-18 (×2): qty 1

## 2011-11-18 MED ORDER — ARTIFICIAL TEARS OP OINT
TOPICAL_OINTMENT | OPHTHALMIC | Status: DC | PRN
  Administered 2011-11-18: 1 via OPHTHALMIC

## 2011-11-18 MED ORDER — BRIMONIDINE TARTRATE 0.15 % OP SOLN
1.0000 [drp] | Freq: Every day | OPHTHALMIC | Status: DC
  Filled 2011-11-18: qty 5

## 2011-11-18 MED ORDER — CHOLECALCIFEROL 10 MCG (400 UNIT) PO TABS
400.0000 [IU] | ORAL_TABLET | Freq: Every day | ORAL | Status: DC
  Administered 2011-11-19 – 2011-11-20 (×2): 400 [IU] via ORAL
  Filled 2011-11-18 (×2): qty 1

## 2011-11-18 MED ORDER — CARVEDILOL 12.5 MG PO TABS
12.5000 mg | ORAL_TABLET | Freq: Two times a day (BID) | ORAL | Status: DC
  Administered 2011-11-18 – 2011-11-20 (×4): 12.5 mg via ORAL
  Filled 2011-11-18 (×6): qty 1

## 2011-11-18 MED ORDER — PHENYLEPHRINE HCL 10 MG/ML IJ SOLN
INTRAMUSCULAR | Status: DC | PRN
  Administered 2011-11-18 (×3): 40 ug via INTRAVENOUS

## 2011-11-18 MED ORDER — ONDANSETRON HCL 4 MG/2ML IJ SOLN
INTRAMUSCULAR | Status: DC | PRN
  Administered 2011-11-18: 4 mg via INTRAVENOUS

## 2011-11-18 MED ORDER — PROMETHAZINE HCL 25 MG/ML IJ SOLN: 6.2500 mg | INTRAMUSCULAR | Status: DC | PRN

## 2011-11-18 MED ORDER — DEXTROSE-NACL 5-0.45 % IV SOLN
INTRAVENOUS | Status: DC
  Administered 2011-11-18 – 2011-11-19 (×2): 50 mL/h via INTRAVENOUS

## 2011-11-18 MED ORDER — ENOXAPARIN SODIUM 40 MG/0.4ML ~~LOC~~ SOLN
40.0000 mg | SUBCUTANEOUS | Status: DC
  Administered 2011-11-19 – 2011-11-20 (×2): 40 mg via SUBCUTANEOUS
  Filled 2011-11-18 (×3): qty 0.4

## 2011-11-18 MED ORDER — BISACODYL 10 MG RE SUPP: 10.0000 mg | Freq: Every day | RECTAL | Status: DC | PRN

## 2011-11-18 MED ORDER — DEXTRAN 40 IN SALINE 10-0.9 % IV SOLN
INTRAVENOUS | Status: DC | PRN
  Administered 2011-11-18: 500 mL

## 2011-11-18 MED ORDER — ACETAMINOPHEN 650 MG RE SUPP: 325.0000 mg | RECTAL | Status: DC | PRN

## 2011-11-18 MED ORDER — DOPAMINE-DEXTROSE 3.2-5 MG/ML-% IV SOLN: 3.0000 ug/kg/min | INTRAVENOUS | Status: DC

## 2011-11-18 MED ORDER — MORPHINE SULFATE 2 MG/ML IJ SOLN
INTRAMUSCULAR | Status: AC
  Filled 2011-11-18: qty 1

## 2011-11-18 MED ORDER — HEPARIN SODIUM (PORCINE) 1000 UNIT/ML IJ SOLN
INTRAMUSCULAR | Status: DC | PRN
  Administered 2011-11-18: 8000 [IU] via INTRAVENOUS
  Administered 2011-11-18: 2000 [IU] via INTRAVENOUS

## 2011-11-18 MED ORDER — WARFARIN SODIUM 2 MG PO TABS
2.0000 mg | ORAL_TABLET | Freq: Every day | ORAL | Status: DC
  Administered 2011-11-19: 2 mg via ORAL
  Filled 2011-11-18 (×2): qty 1

## 2011-11-18 MED ORDER — VITAMIN D 400 UNITS PO TABS: 400.0000 [IU] | ORAL_TABLET | Freq: Every day | ORAL | Status: DC

## 2011-11-18 MED ORDER — ROCURONIUM BROMIDE 100 MG/10ML IV SOLN
INTRAVENOUS | Status: DC | PRN
  Administered 2011-11-18: 50 mg via INTRAVENOUS

## 2011-11-18 MED ORDER — OXYCODONE HCL 5 MG PO TABS
5.0000 mg | ORAL_TABLET | ORAL | Status: DC | PRN
Start: 2011-11-18 — End: 2011-11-20
  Administered 2011-11-19 (×2): 5 mg via ORAL
  Filled 2011-11-18 (×2): qty 1

## 2011-11-18 MED ORDER — LEVOTHYROXINE SODIUM 137 MCG PO TABS
137.0000 ug | ORAL_TABLET | Freq: Every day | ORAL | Status: DC
  Administered 2011-11-19 – 2011-11-20 (×2): 137 ug via ORAL
  Filled 2011-11-18 (×3): qty 1

## 2011-11-18 MED ORDER — SODIUM CHLORIDE 0.9 % IV SOLN: 500.0000 mL | Freq: Once | INTRAVENOUS | Status: AC | PRN

## 2011-11-18 MED ORDER — GUAIFENESIN-DM 100-10 MG/5ML PO SYRP: 15.0000 mL | ORAL_SOLUTION | ORAL | Status: DC | PRN

## 2011-11-18 MED ORDER — ONDANSETRON HCL 4 MG/2ML IJ SOLN: 4.0000 mg | Freq: Four times a day (QID) | INTRAMUSCULAR | Status: DC | PRN

## 2011-11-18 MED ORDER — FENTANYL CITRATE 0.05 MG/ML IJ SOLN
INTRAMUSCULAR | Status: DC | PRN
  Administered 2011-11-18: 50 ug via INTRAVENOUS
  Administered 2011-11-18: 100 ug via INTRAVENOUS
  Administered 2011-11-18 (×2): 50 ug via INTRAVENOUS

## 2011-11-18 MED ORDER — DEXTROSE 5 % IV SOLN
1.5000 g | Freq: Two times a day (BID) | INTRAVENOUS | Status: AC
  Administered 2011-11-18 – 2011-11-19 (×2): 1.5 g via INTRAVENOUS
  Filled 2011-11-18 (×2): qty 1.5

## 2011-11-18 MED ORDER — NEOSTIGMINE METHYLSULFATE 1 MG/ML IJ SOLN
INTRAMUSCULAR | Status: DC | PRN
  Administered 2011-11-18: 4 mg via INTRAVENOUS

## 2011-11-18 MED ORDER — MEPERIDINE HCL 25 MG/ML IJ SOLN: 6.2500 mg | INTRAMUSCULAR | Status: DC | PRN

## 2011-11-18 SURGICAL SUPPLY — 51 items
BAG DECANTER FOR FLEXI CONT (MISCELLANEOUS) ×2 IMPLANT
CANISTER SUCTION 2500CC (MISCELLANEOUS) ×2 IMPLANT
CANNULA VESSEL W/WING WO/VALVE (CANNULA) ×2 IMPLANT
CATH ROBINSON RED A/P 18FR (CATHETERS) ×2 IMPLANT
CLIP TI MEDIUM 24 (CLIP) ×2 IMPLANT
CLIP TI WIDE RED SMALL 24 (CLIP) ×2 IMPLANT
CLOTH BEACON ORANGE TIMEOUT ST (SAFETY) ×2 IMPLANT
COVER PROBE W GEL 5X96 (DRAPES) ×2 IMPLANT
COVER SURGICAL LIGHT HANDLE (MISCELLANEOUS) ×4 IMPLANT
CRADLE DONUT ADULT HEAD (MISCELLANEOUS) ×2 IMPLANT
DERMABOND ADVANCED (GAUZE/BANDAGES/DRESSINGS) ×1
DERMABOND ADVANCED .7 DNX12 (GAUZE/BANDAGES/DRESSINGS) ×1 IMPLANT
DRAPE WARM FLUID 44X44 (DRAPE) ×2 IMPLANT
ELECT REM PT RETURN 9FT ADLT (ELECTROSURGICAL) ×2
ELECTRODE REM PT RTRN 9FT ADLT (ELECTROSURGICAL) ×1 IMPLANT
GLOVE BIO SURGEON STRL SZ7 (GLOVE) ×4 IMPLANT
GLOVE BIO SURGEON STRL SZ7.5 (GLOVE) ×2 IMPLANT
GLOVE BIOGEL PI IND STRL 6.5 (GLOVE) ×2 IMPLANT
GLOVE BIOGEL PI IND STRL 7.5 (GLOVE) ×2 IMPLANT
GLOVE BIOGEL PI INDICATOR 6.5 (GLOVE) ×2
GLOVE BIOGEL PI INDICATOR 7.5 (GLOVE) ×2
GLOVE SURG SS PI 7.0 STRL IVOR (GLOVE) ×6 IMPLANT
GOWN STRL NON-REIN LRG LVL3 (GOWN DISPOSABLE) ×4 IMPLANT
GOWN STRL REIN XL XLG (GOWN DISPOSABLE) ×6 IMPLANT
HEMOSTAT SURGICEL 2X14 (HEMOSTASIS) IMPLANT
KIT BASIN OR (CUSTOM PROCEDURE TRAY) ×2 IMPLANT
KIT ROOM TURNOVER OR (KITS) ×2 IMPLANT
NEEDLE 18GX1X1/2 (RX/OR ONLY) (NEEDLE) ×2 IMPLANT
NS IRRIG 1000ML POUR BTL (IV SOLUTION) ×4 IMPLANT
PACK CAROTID (CUSTOM PROCEDURE TRAY) ×2 IMPLANT
PAD ARMBOARD 7.5X6 YLW CONV (MISCELLANEOUS) ×4 IMPLANT
PATCH VASCULAR VASCU GUARD 1X6 (Vascular Products) ×2 IMPLANT
SET COLLECT BLD 21X3/4 12 (NEEDLE) IMPLANT
SHUNT CAROTID BYPASS 10 (VASCULAR PRODUCTS) ×2 IMPLANT
SHUNT CAROTID BYPASS 12FRX15.5 (VASCULAR PRODUCTS) IMPLANT
SPECIMEN JAR SMALL (MISCELLANEOUS) ×2 IMPLANT
SPONGE SURGIFOAM ABS GEL 100 (HEMOSTASIS) IMPLANT
SUT ETHILON 3 0 PS 1 (SUTURE) IMPLANT
SUT MNCRL AB 4-0 PS2 18 (SUTURE) ×2 IMPLANT
SUT PROLENE 6 0 BV (SUTURE) ×10 IMPLANT
SUT PROLENE 7 0 BV 1 (SUTURE) IMPLANT
SUT SILK 2 0 SH CR/8 (SUTURE) ×2 IMPLANT
SUT VIC AB 3-0 SH 27 (SUTURE) ×1
SUT VIC AB 3-0 SH 27X BRD (SUTURE) ×1 IMPLANT
SYR TB 1ML LUER SLIP (SYRINGE) IMPLANT
SYRINGE 10CC LL (SYRINGE) ×2 IMPLANT
SYSTEM CHEST DRAIN TLS 7FR (DRAIN) IMPLANT
TOWEL OR 17X24 6PK STRL BLUE (TOWEL DISPOSABLE) ×4 IMPLANT
TOWEL OR 17X26 10 PK STRL BLUE (TOWEL DISPOSABLE) ×2 IMPLANT
TRAY FOLEY CATH 14FRSI W/METER (CATHETERS) ×2 IMPLANT
WATER STERILE IRR 1000ML POUR (IV SOLUTION) ×2 IMPLANT

## 2011-11-18 NOTE — Plan of Care (Signed)
Problem: Consults Goal: Diagnosis CEA/CES/AAA Stent Carotid Endarterectomy (CEA)     

## 2011-11-18 NOTE — Op Note (Addendum)
OPERATIVE NOTE  PROCEDURE:   1.  left carotid endarterectomy with bovine patch angioplasty 2.  left intraoperative carotid ultrasound  PRE-OPERATIVE DIAGNOSIS: bilateral high grade asymptomatic carotid stenosis >80%  POST-OPERATIVE DIAGNOSIS: same as above   SURGEON: Leonides Sake, MD  ASSISTANT(S): Lianne Cure, PAC  ANESTHESIA: general  ESTIMATED BLOOD LOSS: 100 cc  FINDING(S): 1.  Continuous Doppler audible flow signatures are appropriate for each carotid artery. 2.  No evidence of intimal flap visualized on transverse or longitudinal ultrasonography. 3.  Calcified carotid plaque with near occluded internal carotid artery lumen.  Disease extended to level of jaw.  SPECIMEN(S):  Carotid plaque (sent to Pathology)  INDICATIONS:   Wendy Cole is a 76 y.o. female who presents with bilateral high grade asymptomatic carotid stenosis >80%.  The patient had higher velocities on the left and a prior left temporal stroke, so I felt the left internal carotid artery should addressed first.  I discussed with the patient the risks, benefits, and alternatives to carotid endarterectomy.  The patient is a poor candidate for carotid artery stenting given her chronic renal insufficiency.  I discussed the procedural details of carotid endarterectomy with the patient.  The patient is aware that the risks of carotid endarterectomy include but are not limited to: bleeding, infection, stroke, myocardial infarction, death, cranial nerve injuries both temporary and permanent, neck hematoma, possible airway compromise, labile blood pressure post-operatively, cerebral hyperperfusion syndrome, and possible need for additional interventions in the future. The patient is aware of the risks and agrees to proceed forward with the procedure.  DESCRIPTION: After full informed written consent was obtained from the patient, the patient was brought back to the operating room and placed supine upon the operating  table.  Prior to induction, the patient received IV antibiotics.  After obtaining adequate anesthesia, the patient was placed into semi-Fowler position with a shoulder roll in place and the patient's neck slightly hyperextended and rotated away from the surgical site.  The patient was prepped in the standard fashion for a left carotid endarterectomy.  I made an incision anterior to the sternocleidomastoid muscle and dissected down through the subcutaneous tissue.  The platysmas was opened with electrocautery.  Then I dissected down to the internal jugular vein.  This was dissected posteriorly until I obtained visualization of the common carotid artery.  This was dissected out and then an umbilical tape was placed around the common carotid artery and I loosely applied a Rumel tourniquet.  I then dissected in a periadventitial fashion along the common carotid artery up to the bifurcation.  I then identified the external carotid artery and the superior thyroid artery.  A 2-0 silk tie was looped around the superior thyroid artery, and I also dissected out the external carotid artery and placed a vessel loop around it.  In continuing the dissection to the internal carotid artery, I identified the facial vein.  This was ligated and then transected, giving me improved exposure of the internal carotid artery.  In the process of this dissection, the hypoglossal nerve was identified.  I then dissected out the internal carotid artery until I identified an area of soft tissue in the internal carotid artery.  Due to the extent of the disease in the internal carotid artery, I had extend the dissection superiorly to the level of the angle of the mandible, taking care to avoid injury to any nerves.  I dissected slightly distal to this area, and placed an umbilical tape around the artery and loosely  applied a Rumel tourniquet.  At this point, we gave the patient a therapeutic bolus of Heparin intravenously (roughly 80 units/kg).   After waiting 3 minutes, then I clamped the internal carotid artery, external carotid artery and then the common carotid artery.  I then made an arteriotomy in the common carotid artery with a 11 blade, and extended the arteriotomy with a Potts scissor down into the common carotid artery, then I carried the arteriotomy through the bifurcation into the internal carotid artery until I reached an area that was not diseased.  This was somewhat difficult due the high grade stenosis in the internal carotid lumen and calcification of the plaque.  At this point, I took the 10 shunt that previously been prepared and I inserted it into the internal carotid artery.  The Rumel tourniquet was then applied to this end of the shunt.  I unclamped the shunt to verify retrograde blood flow in the internal carotid artery.  I then placed the other end of the shunt into the common carotid artery after unclamping the artery.  The Rumel was tightened down around the shunt.  At this point, I verified blood flow in the shunt with a continuous doppler.  At this point, I started the endarterectomy in the common carotid artery with a Cytogeneticist and carried this dissection down into the common carotid artery circumferentially.  Then I transected the plaque at a segment where it was adherent.  I then carried this dissection up into the external carotid artery.  The plaque was extracted by unclamping the external carotid artery and everting the artery.  The dissection was then carried into the internal carotid artery, extracting the remaining portion of the carotid plaque.  I passed the plaque off the field as a specimen.  I then spent the next 30 minutes removing intimal flaps and loose debris.  Eventually I reached the point where the residual plaque was densely adherent and any further dissection would compromise the integrity of the wall.  After verifying that there was no more loose intimal flaps or debris, I re-interrogated the  entirety of this carotid artery.  At this point, I was satisfied that the minimal remaining disease was densely adherent to the wall and wall integrity was intact.  At this point, I then fashioned a bovine pericardial patch for the geometry of this artery and sewed it in place with two running stitch of 6-0 Prolene, one from each end.  Prior to completing this patch angioplasty, I removed the shunt first from the internal carotid artery, from which there was excellent backbleeding, and clamped it.  Then I removed the shunt from the common carotid artery, from which there was excellent antegrade bleeding, and then clamped it.  At this point, I allowed the external carotid artery to backbleed, which was excellent.  Then I instilled heparinized saline in this patched artery and then completed the patch angioplasty in the usual fashion.  First, I released the clamp on the external carotid artery, then I released it on the common carotid artery.  After waiting a few seconds, I then released it on the internal carotid artery.  I then interrogated this patient's arteries with the continuous Doppler.  The audible waveforms in each artery were consistent with the expected characteristics for each artery.  The Sonosite probe was then sterilely draped and used to interrogate the carotid artery in both longitudinal and transverse views.  At this point, I washed out the wound, and placed thrombin and  Gelfoam throughout.  I also gave the patient 30 mg of protamine to reverse his anticoagulation.   After waiting a few minutes, I removed the thrombin and Gelfoam and washed out the wound.  There was no more active bleeding in the surgical site.   I then reapproximated the platysma muscle with a running stitch of 3-0 Vicryl.    The subcutaneous tissue was reapproximated with a running stitch of 3-0 Vicryl.  The skin was then reapproximated with a running subcuticular 4-0 Monocryl stitch.  The skin was then cleaned, dried and  Dermabond was used to reinforce the skin closure.  The patient woke without any problems, neurologically intact.     COMPLICATIONS: none  CONDITION: stable  Leonides Sake, MD [facility] [place] Office: [phone] Pager: [phone]  11/18/2011, 11:49 AM

## 2011-11-18 NOTE — Progress Notes (Signed)
UR COMPLETED  

## 2011-11-18 NOTE — Anesthesia Postprocedure Evaluation (Signed)
  Anesthesia Post-op Note  Patient: Wendy Cole  Procedure(s) Performed: Procedure(s) (LRB): ENDARTERECTOMY CAROTID (Left)  Patient Location: PACU  Anesthesia Type: General  Level of Consciousness: awake  Airway and Oxygen Therapy: Patient Spontanous Breathing  Post-op Pain: mild  Post-op Assessment: Post-op Vital signs reviewed  Post-op Vital Signs: stable  Complications: No apparent anesthesia complications

## 2011-11-18 NOTE — H&P (View-Only) (Signed)
[facility] [place]  New Carotid Patient  Referred by:  Tresa Garter, MD [address] [facility] [address] [place], [place] [address]  Reason for referral: B carotid stenosis  History of Present Illness  Wendy Cole is a 76 y.o. (05/21/1936) female who presents with chief complaint: "clogged arteries".  The patient was evaluated for CAD and had screening carotid duplex done as part of her atherosclerosis screening.  Previous carotid studies demonstrated: RICA 80-99% stenosis, LICA 80-99% stenosis.  Patient has history of L temporal stroke symptom in 2004.  At that point, she describes discoordination and imaging studies consistent with a L CVA.  The patient has never had amaurosis fugax or monocular blindness.  The patient has never had facial drooping or hemiplegia.  The patient has never had receptive or expressive aphasia.   The patient's previous neurologic deficits have resolved.  The patient's risks factors for carotid disease include: DM, hyperlipidemia, and prior smoking.    Past Medical History  Diagnosis Date  . OSA on CPAP   . Anemia   . Anxiety states   . Depressive disorder, not elsewhere classified   . Type II or unspecified type diabetes mellitus without mention of complication, not stated as uncontrolled   . Hyperlipidemia   . Unspecified essential hypertension   . Renal insufficiency   . Osteoarthritis   . Gout   . Iritis   . Nonischemic cardiomyopathy     EF 20 to 25% per echo 06/2011  . Chronic anticoagulation   . NSTEMI (non-ST elevated myocardial infarction) Feb 2013    Mild nonobstructive disease. No LV gram  . PAF (paroxysmal atrial fibrillation)   . Obesity   . Cancer 2010    Right breast  . DVT (deep venous thrombosis) 2010  . Stroke 2004    Left upper lobe  . Pulmonary edema 2013  . Thyroid disease 1969    Three forth of Thyroid removed    Past Surgical History  Procedure Date  . Mastectomy   . Breast  lumpectomy   . Cataract extraction   . Foot surgery   . Mastectomy   . Tonsillectomy 1949  . Abdominal hysterectomy 1974  . Eye surgery 2008    Glaucoma shunt Right eye  . Parotid gland tumor excision 2000  . Cystectomy 1974    Left Breast, chin, left of groin area    History   Social History  . Marital Status: Married    Spouse Name: N/A    Number of Children: N/A  . Years of Education: N/A   Occupational History  . Retired    Social History Main Topics  . Smoking status: Former Smoker -- 2.0 packs/day for 40 years    Types: Cigarettes    Quit date: 05/25/2002  . Smokeless tobacco: Never Used  . Alcohol Use: No  . Drug Use: No  . Sexually Active: Not Currently   Other Topics Concern  . Not on file   Social History Narrative  . No narrative on file    Family History  Problem Relation Age of Onset  . Hypertension    . Heart disease Mother 25    CHF  . Cancer Mother     breast  . Heart disease Father 17  . Heart disease Brother     aaa    Current Outpatient Prescriptions on File Prior to Visit  Medication Sig Dispense Refill  . allopurinol (ZYLOPRIM) 100 MG  tablet Take 1 tablet (100 mg total) by mouth daily.  90 tablet  3  . anastrozole (ARIMIDEX) 1 MG tablet TAKE 1 TABLET BY MOUTH ONCE DAILY  90 tablet  1  . BIOTIN PO Take 1 tablet by mouth daily.      . brimonidine (ALPHAGAN) 0.15 % ophthalmic solution Place 1 drop into both eyes daily.       [name] buPROPion (WELLBUTRIN SR) 150 MG 12 hr tablet Take 150 mg by mouth daily.      . calcium carbonate (CALCIUM 500) 1250 MG tablet Take 1 tablet by mouth daily.      . carvedilol (COREG) 12.5 MG tablet Take 1 tablet (12.5 mg total) by mouth 2 (two) times daily with a meal.  62 tablet  3  . clorazepate (TRANXENE) 7.5 MG tablet Take 1 tablet (7.5 mg total) by mouth 2 (two) times daily as needed for anxiety.  30 tablet  0  . Fluticasone-Salmeterol (ADVAIR) 100-50 MCG/DOSE AEPB Inhale 1 puff into the lungs every 12  (twelve) hours.  60 each  5  . HYDROcodone-acetaminophen (VICODIN) 5-500 MG per tablet Take 1 tablet by mouth every 6 (six) hours as needed.      [name] levothyroxine (SYNTHROID, LEVOTHROID) 137 MCG tablet Take 137 mcg by mouth daily.      [name] LORazepam (ATIVAN) 1 MG tablet Take 1 mg by mouth every 8 (eight) hours as needed.      . methocarbamol (ROBAXIN) 500 MG tablet Take 500 mg by mouth as needed.      [name] omeprazole (PRILOSEC) 40 MG capsule Take 40 mg by mouth daily.       . prednisoLONE acetate (PRED FORTE) 1 % ophthalmic suspension Place 1 drop into both eyes daily as needed. For eye pain      . timolol (TIMOPTIC) 0.5 % ophthalmic solution Place 1 drop into both eyes 2 (two) times daily. Twice daily      . torsemide (DEMADEX) 20 MG tablet Take 1 tablet (20 mg total) by mouth daily.  30 tablet  6  . vitamin D, CHOLECALCIFEROL, 400 UNITS tablet Take 400 Units by mouth daily.       [name] warfarin (COUMADIN) 1 MG tablet TAKE 2 TABLETS ON MON & THURS AND 3 TABLETS ON ALL OTHER DAYS OR AS DIRECTED  76 tablet  6    Allergies  Allergen Reactions  . Atorvastatin Other (See Comments)    Muscle aches  . Colesevelam Other (See Comments)    unknown  . Lasix (Furosemide)     REVIEW OF SYSTEMS:  (Positives checked otherwise negative)  CARDIOVASCULAR: [ ]  chest pain    [ ]  chest pressure    [x]  palpitations    [ ]  orthopnea   [ ]  dyspnea on exert. [ ]  claudication    [x]  swelling in legs  [x]  DVT     [x]  varicose veins  PULMONARY:    [ ]  productive cough [ ]  asthma  [ ]  wheezing  NEUROLOGIC:    [ ]  weakness    [ ]  paresthesias   [ ]  aphasia    [ ]  amaurosis    [ ]  dizziness  HEMATOLOGIC:    [ ]  bleeding problems  [ ]  clotting disorders  MUSCULOSKEL: [ ]  joint pain     [ ]  joint swelling  GASTROINTEST:  [ ]   blood in stool   [ ]   hematemesis  GENITOURINARY:   [ ]   dysuria    [ ]   hematuria  PSYCHIATRIC:   [ ]  history of major depression  INTEGUMENTARY: [ ]  rashes    [ ]  ulcers  CONSTITUTIONAL:  [ ]   fever     [ ]  chills  Physical Examination  Filed Vitals:   11/13/11 1214  BP: 150/71  Pulse: 53  Resp: 16  Height: 5\' 4"  (1.626 m)  Weight: 220 lb 9.6 oz (100.064 kg)  SpO2: 98%   Body mass index is 37.87 kg/(m^2).  General: A&O x 3, WDWN ,obese  Head: [place]/AT  Ear/Nose/Throat: Hearing grossly intact, nares w/o erythema or drainage, oropharynx w. Erythema w/o Exudate  Eyes: PERRLA, EOMI  Neck: Supple, no nuchal rigidity, no palpable LAD  Pulmonary: Sym exp, good air movt, CTAB, no rales, rhonchi, & wheezing  Cardiac: RRR, Nl S1, S2, no Murmurs, rubs or gallops  Vascular: Vessel Right Left  Radial Palpable Palpable  Brachial Palpable Palpable  Carotid Palpable, without bruit Palpable, without bruit  Aorta Non-palpable due to obesity N/A  Femoral Faintly Palpable due to pannus Faintly Palpable due to pannus  Popliteal Non-palpable Non-palpable  PT Faintly Palpable Faintly Palpable  DP Non-Palpable Non-Palpable   Gastrointestinal: soft, NTND, -G/R, - HSM, - masses, - CVAT B, obese  Musculoskeletal: M/S 5/5 throughout , Extremities without ischemic changes   Neurologic: CN 2-12 intact , Pain and light touch intact in extremities , Motor exam as listed above  Psychiatric: Judgment intact, Mood & affect appropriate for pt's clinical situation  Dermatologic: See M/S exam for extremity exam, no rashes otherwise noted  Lymph : No Cervical, Axillary, or Inguinal lymphadenopathy   Non-Invasive Vascular Imaging  Outside CAROTID DUPLEX (Date: 11/10/11):   R ICA stenosis: 80-99%, PSV 529/140 c/s  R VA: patent and antegrade  L ICA stenosis: 80-99%, PSV 672/235 c/s  L VA: patent and antegrade  Outside Studies/Documentation  3 pages of outside documents were reviewed including: outside carotid duplex and cardiology clinic chart  Medical Decision Making  Wendy Cole is a 76 y.o. female who presents with: asx severe B ICA stenosis   Given previous h/o CVA on  L and higher velocities on this side, I have offered the patient: L CEA.  The R ICA will need a R CEA in 4-6 weeks after the L. I discussed with the patient the risks, benefits, and alternatives to carotid endarterectomy.  It is not clear if the patient is a candidate for carotid artery stenting.  Given her Cr 1.8, she is not a candidate for CTA Neck due to the nephrotoxicity concerns. Also CREST demonstrates a increased stroke rate in the > 73 year old population, so if she is an acceptable risk for CEA, I would recommend such. Risk stratification from Dr. Eden Emms will help with the final decision making in this case. As a precaution, I discussed the procedural details of carotid endarterectomy with the patient. The patient is aware that the risks of carotid endarterectomy include but are not limited to: bleeding, infection, stroke, myocardial infarction, death, cranial nerve injuries both temporary and permanent, neck hematoma, possible airway compromise, labile blood pressure post-operatively, cerebral hyperperfusion syndrome, and possible need for additional interventions in the future. The patient is aware of the risks and agrees to proceed forward with the procedure.  I discussed in depth with the patient the nature of atherosclerosis, and emphasized the importance of maximal medical management including strict control of blood pressure, blood glucose, and lipid levels, obtaining regular exercise, antiplatelet agents, and cessation of smoking.  The patient  is aware that without maximal medical management the underlying atherosclerotic disease process will progress, limiting the benefit of any interventions.  As long as there are no cardiac contraindication, the patient is tenatively scheduled for the 26 JUN 13.  She will need to hold her coumadin (distant h/o DVT) prior to surgery.  A drain maybe needed if she demonstrates significant bleeding inatraoperatively.  Thank you for allowing [place] to  participate in this patient's care.  Leonides Sake, MD [facility] [place] Office: [phone] Pager: [phone]  11/13/2011, 2:04 PM

## 2011-11-18 NOTE — Transfer of Care (Signed)
Immediate Anesthesia Transfer of Care Note  Patient: Wendy Cole  Procedure(s) Performed: Procedure(s) (LRB): ENDARTERECTOMY CAROTID (Left)  Patient Location: PACU  Anesthesia Type: General  Level of Consciousness: awake, alert , oriented and patient cooperative  Airway & Oxygen Therapy: Patient Spontanous Breathing and Patient connected to face mask oxygen  Post-op Assessment: Report given to PACU RN, Post -op Vital signs reviewed and stable and Patient moving all extremities X 4  Post vital signs: Reviewed and stable  Complications: No apparent anesthesia complications

## 2011-11-18 NOTE — Progress Notes (Signed)
Received patient from [facility] per Vernona Rieger, RN. On admission pt BP 128/44 cuff and 166/48 aline, HR 64, 95% on RA, Temp 98.2. Neuro intact, tongue midline and grips equal.Pt arrived on nitro at 83 mcg, calling MD for order clarification and IVF clarification. Husband at bedside.

## 2011-11-18 NOTE — Progress Notes (Signed)
Patient a-line pressure continues to elevate, called and spoke to Lianne Cure, [place] about pressure, was asked by her to speak with Dr.Chen, paged Dr. Imogene Burn, no response, called and spoke to Dr. Hart Rochester, who was now covering for Dr. Imogene Burn about pt pressures both a-line and cuff pressure and in regards to nitro drip that pt is on at 83 mcgs. Current aline 192/53, cuff 142/59, I administered 4 mg IV morphine for paine at 1535 and 10 mg Apresoline at 1608 for bp. Spoke with Dr> Hart Rochester who advised me to wait 45 minutes for Apresoline to take affect and retake BP and go by the cuff pressure. After 45 minutes if BP still up give pt 10 mg Labateol IV, Current HR 62, Dr. Hart Rochester made aware of and was told to continue with Labatelol orders. After time if BP still not trending down to go ahead and readminister IV Apresoline 10 mg, every 2 hours as ordered. Pt in no acute stress during these events. At 1721 gave the 10 mg IV labetaol. Current BP cuff is 162/50. Will continue to monitor pt and go by cuff pressure per Dr. Hart Rochester.

## 2011-11-18 NOTE — Anesthesia Procedure Notes (Signed)
Procedure Name: Intubation Date/Time: 11/18/2011 8:34 AM Performed by: Leona Singleton A Pre-anesthesia Checklist: Patient identified Patient Re-evaluated:Patient Re-evaluated prior to inductionOxygen Delivery Method: Circle system utilized Preoxygenation: Pre-oxygenation with 100% oxygen Intubation Type: IV induction Ventilation: Oral airway inserted - appropriate to patient size and Mask ventilation without difficulty Laryngoscope Size: Miller and 2 Grade View: Grade II Tube type: Oral Tube size: 7.0 mm Number of attempts: 1 Airway Equipment and Method: Stylet and LTA kit utilized Placement Confirmation: ETT inserted through vocal cords under direct vision,  positive ETCO2 and breath sounds checked- equal and bilateral Secured at: 21 cm Tube secured with: Tape Dental Injury: Teeth and Oropharynx as per pre-operative assessment

## 2011-11-18 NOTE — Progress Notes (Signed)
PHARMACIST - PHYSICIAN COMMUNICATION DR:  Imogene Burn CONCERNING: Pharmacy Care Issues Regarding Warfarin Labs  RECOMMENDATION (Action Taken): A baseline and daily protime for three days has been ordered to meet the [facility] Patient safety goal and comply with the current [facility] Pharmacy & Therapeutics Committee policy.   The Pharmacy will defer all warfarin dose order changes and follow up of lab results to the prescriber unless an additional order to initiate a "pharmacy Coumadin consult" is placed.  DESCRIPTION:  While hospitalized, to be in compliance with The Joint Commission National Patient Safety Goals, all patients on warfarin must have a baseline and/or current protime prior to the administration of warfarin. Pharmacy has received your order for warfarin without these required laboratory assessments.   [place], [address].D., BCPS Clinical Pharmacist Pager: [phone] 11/18/2011 3:36 PM

## 2011-11-18 NOTE — Interval H&P Note (Signed)
[facility] [place]  History and Physical Update  The patient was interviewed and re-examined.  The patient's previous History and Physical has been reviewed and is unchanged.  There is no change in the plan of care.  Leonides Sake, MD [facility] [place] Office: [phone] Pager: [phone]  11/18/2011, 7:49 AM

## 2011-11-18 NOTE — Preoperative (Signed)
Beta Blockers   Reason not to administer Beta Blockers:Carvedilol 11/18/11 0500

## 2011-11-18 NOTE — Anesthesia Preprocedure Evaluation (Addendum)
Anesthesia Evaluation  Patient identified by MRN, date of birth, ID band Patient awake    Reviewed: Allergy & Precautions, H&P , NPO status , Patient's Chart, lab work & pertinent test results, reviewed documented beta blocker date and time   History of Anesthesia Complications Negative for: history of anesthetic complications  Airway Mallampati: II TM Distance: >3 FB Neck ROM: Full    Dental  (+) Teeth Intact and Dental Advisory Given   Pulmonary shortness of breath and with exertion, sleep apnea and Continuous Positive Airway Pressure Ventilation , COPD COPD inhaler, former smoker breath sounds clear to auscultation        Cardiovascular hypertension, Pt. on medications and Pt. on home beta blockers + Past MI and +CHF + dysrhythmias Atrial Fibrillation + Valvular Problems/Murmurs Rhythm:Regular Rate:Normal  RICA 80-99% stenosis, LICA 80-99% stenosis Nonischemic cardiomyopathy         EF 20 to 25% per echo 06/2011   NSTEMI (non-ST elevated myocardial infarction)       Feb 2013   Mild nonobstructive disease. No LV gram       Neuro/Psych  Headaches, PSYCHIATRIC DISORDERS Anxiety Depression CVA (left temporal CVA; issues with speech), Residual Symptoms    GI/Hepatic Neg liver ROS, GERD-  Medicated and Controlled,  Endo/Other  Morbid obesityGout   Renal/GU Renal InsufficiencyRenal disease (Cr 1.57)     Musculoskeletal  (+) Arthritis -, Osteoarthritis,    Abdominal (+) + obese,   Peds  Hematology DVT left leg Chronic coumadin therapy--LD Saturday   Anesthesia Other Findings   Reproductive/Obstetrics                      Anesthesia Physical Anesthesia Plan  ASA: III  Anesthesia Plan: General   Post-op Pain Management:    Induction: Intravenous  Airway Management Planned: Oral ETT  Additional Equipment: Arterial line  Intra-op Plan:   Post-operative Plan: Extubation in OR  Informed  Consent: I have reviewed the patients History and Physical, chart, labs and discussed the procedure including the risks, benefits and alternatives for the proposed anesthesia with the patient or authorized representative who has indicated his/her understanding and acceptance.   Dental advisory given  Plan Discussed with: CRNA and Surgeon  Anesthesia Plan Comments:         Anesthesia Quick Evaluation

## 2011-11-19 ENCOUNTER — Encounter (HOSPITAL_COMMUNITY): Payer: Self-pay | Admitting: Physician Assistant

## 2011-11-19 DIAGNOSIS — I658 Occlusion and stenosis of other precerebral arteries: Secondary | ICD-10-CM

## 2011-11-19 DIAGNOSIS — I10 Essential (primary) hypertension: Secondary | ICD-10-CM

## 2011-11-19 DIAGNOSIS — I4891 Unspecified atrial fibrillation: Secondary | ICD-10-CM

## 2011-11-19 LAB — CBC
MCH: 27.2 pg (ref 26.0–34.0)
MCV: 84.7 fL (ref 78.0–100.0)
Platelets: 232 10*3/uL (ref 150–400)
RDW: 16.4 % — ABNORMAL HIGH (ref 11.5–15.5)
WBC: 14 10*3/uL — ABNORMAL HIGH (ref 4.0–10.5)

## 2011-11-19 LAB — BASIC METABOLIC PANEL
Calcium: 8.4 mg/dL (ref 8.4–10.5)
Chloride: 100 mEq/L (ref 96–112)
Creatinine, Ser: 1.62 mg/dL — ABNORMAL HIGH (ref 0.50–1.10)
GFR calc Af Amer: 35 mL/min — ABNORMAL LOW (ref >90)

## 2011-11-19 LAB — PROTIME-INR: Prothrombin Time: 16.6 seconds — ABNORMAL HIGH (ref 11.6–15.2)

## 2011-11-19 LAB — GLUCOSE, CAPILLARY: Glucose-Capillary: 133 mg/dL — ABNORMAL HIGH (ref 70–99)

## 2011-11-19 MED ORDER — FLUTICASONE-SALMETEROL 100-50 MCG/DOSE IN AEPB
1.0000 | INHALATION_SPRAY | Freq: Two times a day (BID) | RESPIRATORY_TRACT | Status: DC
  Administered 2011-11-19 – 2011-11-20 (×2): 1 via RESPIRATORY_TRACT
  Filled 2011-11-19: qty 14

## 2011-11-19 NOTE — Clinical Social Work Psychosocial (Signed)
     Clinical Social Work Department BRIEF PSYCHOSOCIAL ASSESSMENT 11/19/2011  Patient:  Wendy Cole, Wendy Cole     Account Number:  [id number]     Admit date:  11/18/2011  Clinical Social Worker:  Dennison Bulla  Date/Time:  11/19/2011 03:15 PM  Referred by:  RN  Date Referred:  11/19/2011 Referred for  SNF Placement   Other Referral:   Interview type:  Patient Other interview type:   Husband and son present    PSYCHOSOCIAL DATA Living Status:  FAMILY Admitted from facility:   Level of care:   Primary support name:  Wendy Cole Primary support relationship to patient:  SPOUSE Degree of support available:   Strong    CURRENT CONCERNS Current Concerns  Post-Acute Placement   Other Concerns:    SOCIAL WORK ASSESSMENT / PLAN CSW spoke with RN who reported that patient is using assistance to ambulate. CSW reviewed chart and met with patient at bedside. Husband and son were present. Patient was agreeable to family being involved in assessment.    CSW introduced myself and explained role. CSW spoke with patient and family regarding her living arrangements before admission. Husband reports that he assists patient at home and she uses a walker. CSW spoke with patient regarding desires for dc. Patient and family reports that patient would prefer to go home. Family reports they will be able to provide support and will assist with her needs. Husband reports that patient is almost back at baseline and feels that in a few days patient will have regained her strength. CSW explained that CSW and CM can assist with dc needs. CSW will continue to follow to determine disposition.   Assessment/plan status:  Psychosocial Support/Ongoing Assessment of Needs Other assessment/ plan:   Information/referral to community resources:   HH vs SNF    PATIENTS/FAMILYS RESPONSE TO PLAN OF CARE: Patient was drowsy during assessment. Family was engaged and appreciative of CSW consult. Family expressed  patient's love for being at home to see all the birds in her bird feeder. CSW assisted son in hanging pictures of birds in patient's room to "brighten her mood". Family agreeable to CSW follow up but prefers home.

## 2011-11-19 NOTE — Consult Note (Signed)
CARDIOLOGY CONSULT NOTE  [name], MRN: [id number], DOB/AGE: 08/08/1935 76 y.o. Admit date: 11/18/2011   Date of Consult: 11/19/2011 Primary Physician: Sonda Primes, MD Primary Cardiologist: Dr. [place]  Chief Complaint: here for carotid surgery Reason for Consult: elevated BP  HPI: Ms. Schamp is a 76 y/o F with hx of NICM, PAF, OSA, HTN, prior DVT, renal insufficiency who presented to [facility] 11/18/11 for planned L carotid endarterectomy. Carotid duplex on 11/10/11 showed bilateral 80-99% ICA stenosis it appears eventual plan is for staged R intervention. Post procedurally she has been having elevated blood pressures requiring the use of IV NTG gtt which is being weaned. Per Dr. Nicky Pugh note today overly aggressive BP control is not optimal given her stroke risk with RICA>80% but given that she is still requiring IV NTG with SBP 140-150, our assistance is requested. Most recent BP is 142/54. She is being weaned off NTG, currently at 69mcg/min. Current BP regimen includes Coreg 12.5mg  BID, demadex 20mg  daily as at home. She is currently asymptomatic. No CP, SOB, palpitations, syncope, nausea, vomiting, fever or chills. She does have occasional "flip-flop" she relates to her afib from time to time but it has not bothered her here.  Past Medical History  Diagnosis Date  . OSA on CPAP   . Anemia   . Anxiety states   . Depressive disorder, not elsewhere classified   . Type II or unspecified type diabetes mellitus without mention of complication, not stated as uncontrolled   . Hyperlipidemia   . Unspecified essential hypertension   . Renal insufficiency   . Osteoarthritis   . Gout   . Iritis   . Nonischemic cardiomyopathy     EF 20 to 25% per echo 06/2011  . Chronic anticoagulation   . NSTEMI (non-ST elevated myocardial infarction) Feb 2013    06/2011 cath  Mild nonobstructive disease. No LV gram  . PAF (paroxysmal atrial fibrillation)   . Obesity   . Cancer 2010     Right breast  . DVT (deep venous thrombosis) 2010  . Stroke 2004    Left upper lobe  . Pulmonary edema 2013  . Thyroid disease 1969    Three forth of Thyroid removed  . Complication of anesthesia     "fights it", for colonoscopy  . Flash pulmonary edema     06/2011  . HOH (hard of hearing)     deaf in L completely      Most Recent Cardiac Studies: 2D echo 06/2011 Study Conclusions  - Left ventricle: Systolic function was severely reduced. The estimated ejection fraction was in the range of 25% to 30%. Features are consistent with a pseudonormal left ventricular filling pattern, with concomitant abnormal relaxation and increased filling pressure (grade 2 diastolic dysfunction). Doppler parameters are consistent with elevated mean left atrial filling pressure. - Regional wall motion abnormality: Akinesis of the basal-mid anteroseptal myocardium; hypokinesis of the entire anterior, mid inferolateral, basal-mid anterolateral, and apical lateral myocardium. - Aortic valve: Mild regurgitation. - Mitral valve: Mild regurgitation. - Left atrium: The atrium was moderately dilated. - Right atrium: The atrium was mildly dilated. - Pulmonary arteries: PA peak pressure: 49mm Hg (S). Impressions: - The right ventricular systolic pressure was increased consistent with mild pulmonary hypertension    Surgical History:  Past Surgical History  Procedure Date  . Mastectomy   . Breast lumpectomy   . Cataract extraction   . Foot surgery   . Mastectomy   . Tonsillectomy 1949  .  Abdominal hysterectomy 1974  . Eye surgery 2008    Glaucoma shunt Right eye  . Parotid gland tumor excision 2000  . Cystectomy 1974    Left Breast, chin, left of groin area  . Thyroidectomy, partial 1969     Home Meds: Prior to Admission medications   Medication Sig Start Date End Date Taking? Authorizing Provider  allopurinol (ZYLOPRIM) 100 MG tablet Take 1 tablet (100 mg total) by mouth daily. 09/15/11 09/14/12 Yes  Georgina Quint Plotnikov, MD  anastrozole (ARIMIDEX) 1 MG tablet TAKE 1 TABLET BY MOUTH ONCE DAILY 09/29/11  Yes Ladene Artist, MD  BIOTIN PO Take 1 tablet by mouth daily.   Yes Historical Provider, MD  brimonidine (ALPHAGAN) 0.15 % ophthalmic solution Place 1 drop into both eyes daily.    Yes Historical Provider, MD  buPROPion (WELLBUTRIN SR) 150 MG 12 hr tablet Take 150 mg by mouth daily.   Yes Historical Provider, MD  calcium carbonate (CALCIUM 500) 1250 MG tablet Take 1 tablet by mouth daily.   Yes Historical Provider, MD  carvedilol (COREG) 12.5 MG tablet Take 1 tablet (12.5 mg total) by mouth 2 (two) times daily with a meal. 07/21/11 07/20/12 Yes Dorothea Ogle, MD  clorazepate (TRANXENE) 7.5 MG tablet Take 1 tablet (7.5 mg total) by mouth 2 (two) times daily as needed for anxiety. 10/21/11  Yes Newt Lukes, MD  Fluticasone-Salmeterol (ADVAIR) 100-50 MCG/DOSE AEPB Inhale 1 puff into the lungs every 12 (twelve) hours. 09/15/11  Yes Georgina Quint Plotnikov, MD  levothyroxine (SYNTHROID, LEVOTHROID) 137 MCG tablet Take 137 mcg by mouth daily.   Yes Historical Provider, MD  LORazepam (ATIVAN) 1 MG tablet Take 1 mg by mouth every 8 (eight) hours as needed.   Yes Historical Provider, MD  omeprazole (PRILOSEC) 40 MG capsule Take 40 mg by mouth daily.    Yes Historical Provider, MD  prednisoLONE acetate (PRED FORTE) 1 % ophthalmic suspension Place 1 drop into both eyes daily as needed. For eye pain   Yes Historical Provider, MD  timolol (TIMOPTIC) 0.5 % ophthalmic solution Place 1 drop into both eyes 2 (two) times daily. Twice daily   Yes Historical Provider, MD  torsemide (DEMADEX) 20 MG tablet Take 1 tablet (20 mg total) by mouth daily. 08/07/11 08/06/12 Yes Rosalio Macadamia, NP  vitamin D, CHOLECALCIFEROL, 400 UNITS tablet Take 400 Units by mouth daily.    Yes Historical Provider, MD  warfarin (COUMADIN) 1 MG tablet Take 2-3 mg by mouth daily. Take 3mg  on Mondays, and Thursdays and take 2mg  on Tuesday, Wed.  Fridays, Sat. And Sun.   Yes Historical Provider, MD    Inpatient Medications:    . allopurinol  100 mg Oral Daily  . aspirin EC  325 mg Oral Daily  . brimonidine  1 drop Both Eyes Daily  . buPROPion  150 mg Oral Daily  . calcium carbonate  1 tablet Oral Daily  . carvedilol  12.5 mg Oral BID WC  . cefUROXime (ZINACEF)  IV  1.5 g Intravenous Q12H  . cholecalciferol  400 Units Oral Daily  . clorazepate  7.5 mg Oral BID  . docusate sodium  100 mg Oral Daily  . enoxaparin  40 mg Subcutaneous Q24H  . Fluticasone-Salmeterol  1 puff Inhalation Q12H  . hydrALAZINE      . levothyroxine  137 mcg Oral QAC breakfast  . morphine      . pantoprazole  40 mg Oral Q1200  . sodium chloride      .  timolol  1 drop Both Eyes BID  . torsemide  20 mg Oral Daily  . warfarin  2 mg Oral q1800  . Warfarin - Physician Dosing Inpatient   Does not apply q1800  . DISCONTD: brimonidine  1 drop Both Eyes Daily  . DISCONTD: vitamin D (CHOLECALCIFEROL)  400 Units Oral Daily    Allergies:  Allergies  Allergen Reactions  . Atorvastatin Other (See Comments)    Muscle aches  . Colesevelam Other (See Comments)    unknown  . Lasix (Furosemide) Other (See Comments)    Muscle cramps  . Statins Other (See Comments)    Muscle aches  . Tape Rash    History   Social History  . Marital Status: Married    Spouse Name: N/A    Number of Children: N/A  . Years of Education: N/A   Occupational History  . Retired    Social History Main Topics  . Smoking status: Former Smoker -- 2.0 packs/day for 40 years    Types: Cigarettes    Quit date: 05/25/2002  . Smokeless tobacco: Never Used  . Alcohol Use: No  . Drug Use: No  . Sexually Active: Not Currently   Other Topics Concern  . Not on file   Social History Narrative  . No narrative on file     Family History  Problem Relation Age of Onset  . Hypertension    . Heart disease Mother 38    CHF  . Cancer Mother     breast  . Heart disease Father 35    . Heart disease Brother     aaa     Review of Systems: General: negative for chills, fever, night sweats or weight changes.  Cardiovascular: see above Dermatological: negative for rash Respiratory: negative for cough or wheezing Urologic: negative for hematuria Abdominal: negative for nausea, vomiting, diarrhea, bright red blood per rectum, melena, or hematemesis Neurologic: negative for visual changes, syncope, or dizziness All other systems reviewed and are otherwise negative except as noted above.  Labs:  Lab Results  Component Value Date   WBC 14.0* 11/19/2011   HGB 9.6* 11/19/2011   HCT 29.9* 11/19/2011   MCV 84.7 11/19/2011   PLT 232 11/19/2011    Lab 11/19/11 0400 11/16/11 1626  NA 133* --  K 4.1 --  CL 100 --  CO2 19 --  BUN 25* --  CREATININE 1.62* --  CALCIUM 8.4 --  PROT -- 7.0  BILITOT -- 0.2*  ALKPHOS -- 88  ALT -- 13  AST -- 21  GLUCOSE 153* --    Radiology/Studies:  1. Chest 2 View 11/16/2011  *RADIOLOGY REPORT*  Clinical Data: Preoperative evaluation for left carotid endarterectomy.  CHEST - 2 VIEW  Comparison: Chest x-ray 07/14/2011.  Findings: Lungs appear mildly hyperexpanded.  No acute consolidative air space disease.  No definite pleural effusions. Pulmonary vasculature is normal.  Heart size is mildly enlarged (unchanged).  Left mid lung linear opacities most compatible with an area of scarring.  Mediastinal contours are unremarkable. Atherosclerotic calcifications are noted within the arch of the aorta.  IMPRESSION: 1.  No radiographic evidence of acute cardiopulmonary disease. 2.  Mild hyperexpansion of the lungs is similar to prior examinations. 3.  Mild cardiomegaly. 4.  Atherosclerosis. 5.  Scarring in the lingula is unchanged.  Original Report Authenticated By: Florencia Reasons, M.D.   EKG: NSR 73bpm minimal voltage for LVH, inferior infarct age undetermined. TWI from prior EKG has resolved  Physical  Exam: Blood pressure 142/45, pulse 87,  temperature 99.2 F (37.3 C), temperature source Oral, resp. rate 23, height 5\' 4"  (1.626 m), weight 224 lb 3.3 oz (101.7 kg), SpO2 97.00%. General: Well developed overweight WF in no acute distress. Head: Normocephalic, atraumatic, sclera non-icteric, no xanthomas, nares are without discharge.  Neck: Prominent R carotid pulsations with bruit. L neck has new healing incision site. Lungs: Clear bilaterally to auscultation without wheezes, rales, or rhonchi. Breathing is unlabored. Heart: RRR with S1 S2. 2/6 SEM heard best in R/LUSB. No rubs or gallops appreciated. Abdomen: Soft, non-tender, non-distended with normoactive bowel sounds. No hepatomegaly. No rebound/guarding. No obvious abdominal masses. Msk:  Strength and tone appear normal for age. Extremities: No clubbing or cyanosis. No edema.  Distal pedal pulses are 2+ and equal bilaterally. Neuro: Alert and oriented X 3. Moves all extremities spontaneously. Psych:  Responds to questions appropriately with a normal affect.   Assessment and Plan:   1. Carotid disease s/p L carotid endarterectomy, for eventual staged R carotid endarterectomy 2. HTN overnight requiring IV NTG 3. H/o NICM 4. H/o renal insufficiency 5. PAF, on chronic Coumadin, currently maintaining NSR  BP currently improved, is maintaining 130s-140s on IV NTG which is being weaned. Elevated BP last evening may have been post-surgical stress response. Would continue to wean IV NTG and continue home medications, monitor BP. See below for additional thoughts.  Signed, Ronie Spies PA-C 11/19/2011, 9:47 AM   Patient seen and examined.  AGree with findings of D Dunn Patient with history of NICM, HTN.  Now s/p L CEA Last night BP signif elevated   Rx with IV NTG.  This is being tapered and BP is improved.  Currently denies SOB or CP Lungs:  CTA   Cardiac::  RRR.  No S3, No murmurs.  Neck  CEA L   Ext:  No edema.  2+ pulses  Impr:  1.  HTN  Improved.  Wean NTG to off.   COntinue home meds.  Follow closely on next admit  2.  NICM  Volume status looks pretty good.  Keep on same regimen.  3.  PAF.  Will need anticoag.  WIll be available as needed.  Dietrich Pates 12:04 PM

## 2011-11-19 NOTE — Progress Notes (Addendum)
VASCULAR AND VEIN SURGERY POST - OP CEA PROGRESS NOTE  Date of Surgery: 11/18/2011 Surgeon: Moishe Spice): Fransisco Hertz, MD 1 Day Post-Op left Carotid Endarterectomy .  HPI: ZYANNE SCHUMM is a 76 y.o. female who is 1 Day Post-Op left Carotid Endarterectomy . Patient is doing well. Patient denies headache; Patient denies difficulty swallowing; denies weakness in upper or lower extremities; Pt. denies other symptoms of stroke or TIA. Pt somnolent but arousable - 4mg  of Morphine at 6 am  IMAGING: No results found.  Significant Diagnostic Studies: CBC Lab Results  Component Value Date   WBC 14.0* 11/19/2011   HGB 9.6* 11/19/2011   HCT 29.9* 11/19/2011   MCV 84.7 11/19/2011   PLT 232 11/19/2011    BMET    Component Value Date/Time   NA 133* 11/19/2011 0400   K 4.1 11/19/2011 0400   CL 100 11/19/2011 0400   CO2 19 11/19/2011 0400   GLUCOSE 153* 11/19/2011 0400   GLUCOSE 81 04/09/2006 1716   BUN 25* 11/19/2011 0400   CREATININE 1.62* 11/19/2011 0400   CALCIUM 8.4 11/19/2011 0400   CALCIUM 9.6 09/27/2006 2054   GFRNONAA 30* 11/19/2011 0400   GFRAA 35* 11/19/2011 0400    COAG Lab Results  Component Value Date   INR 1.32 11/19/2011   INR 1.33 11/18/2011   INR 1.67* 11/16/2011   PROTIME 21.6* 11/06/2011   PROTIME 24.0* 10/09/2011   PROTIME 27.6* 09/11/2011   No results found for this basename: PTT      Intake/Output Summary (Last 24 hours) at 11/19/11 0730 Last data filed at 11/19/11 0600  Gross per 24 hour  Intake   3650 ml  Output   1950 ml  Net   1700 ml    Physical Exam:  BP Readings from Last 3 Encounters:  11/19/11 134/47  11/19/11 134/47  11/16/11 94/63   Temp Readings from Last 3 Encounters:  11/19/11 99 F (37.2 C) Oral  11/19/11 99 F (37.2 C) Oral  11/16/11 97.6 F (36.4 C)    SpO2 Readings from Last 3 Encounters:  11/19/11 94%  11/19/11 94%  11/16/11 98%   Pulse Readings from Last 3 Encounters:  11/19/11 79  11/19/11 79  11/16/11 61    Pt is  Arousable and following commands left Neck Wound is healing well Patient with Negative tongue deviation and Negative facial droop Pt has good  and equal strength in all extremities  Assessment: MIRI JOSE is a 76 y.o. female is S/P Left Carotid endarterectomy Pt is somnolent but arousable sec to narcotics Hypertension on NTG drip to maintain SBP in the 140's - 150's   Plan: will consult [facility] card - Dr Eden Emms has seen in the past for help with BP control ? Need for coumadin Follow-up in 2 weeks Poss transfer to 2000 once off NTG drip D/C morphine   ROCZNIAK,REGINA J [phone] 11/19/2011 7:30 AM   Addendum  I have independently interviewed and examined the patient, and I agree with the physician assistant's findings.  Pt's left neck incision clean without hematoma.  Neurologically patient completely intact.  No complaint of posterior auricular burning or numbness.  No jawline numbness noted.  Patient was at risk for both marginal mandibular nerve and posterior auricular nerve palsies due to high bifurcation in this patient.  At this point, there is no evidence of either.  Given both internal carotids >80% (L nearly occluded), this patient is at high risk for cerebral hyperperfusion, so keeping the BP <160  acceptable as pt's baseline BP is higher.  This patient's baseline BP is documented in 130-140s so overly aggressive BP control is not optimal given the stroke risk especially with R ICA stenosis >80%.  Given still requiring nitroglycerin drip to maintain BP in 140-150, will get [facility]'s input.  Patient cannot be discharged until BP accept on oral agents.  Keep in 3300 for now.  Leonides Sake, MD [facility] Office: [phone] Pager: [phone]  11/19/2011, 9:02 AM

## 2011-11-20 ENCOUNTER — Encounter (HOSPITAL_COMMUNITY): Payer: Self-pay | Admitting: Vascular Surgery

## 2011-11-20 DIAGNOSIS — I4891 Unspecified atrial fibrillation: Secondary | ICD-10-CM

## 2011-11-20 DIAGNOSIS — I10 Essential (primary) hypertension: Secondary | ICD-10-CM

## 2011-11-20 LAB — PROTIME-INR: Prothrombin Time: 16.5 seconds — ABNORMAL HIGH (ref 11.6–15.2)

## 2011-11-20 LAB — GLUCOSE, CAPILLARY: Glucose-Capillary: 124 mg/dL — ABNORMAL HIGH (ref 70–99)

## 2011-11-20 MED ORDER — CARVEDILOL 25 MG PO TABS
25.0000 mg | ORAL_TABLET | Freq: Two times a day (BID) | ORAL | Status: DC
  Filled 2011-11-20 (×2): qty 1

## 2011-11-20 MED ORDER — CARVEDILOL 12.5 MG PO TABS: ORAL_TABLET | ORAL | Status: DC

## 2011-11-20 MED ORDER — CARVEDILOL 12.5 MG PO TABS: 25.0000 mg | ORAL_TABLET | Freq: Two times a day (BID) | ORAL | Status: DC

## 2011-11-20 MED ORDER — OXYCODONE HCL 5 MG PO TABS: 5.0000 mg | ORAL_TABLET | ORAL | Status: AC | PRN

## 2011-11-20 NOTE — Progress Notes (Signed)
VASCULAR AND VEIN SURGERY POST - OP CEA PROGRESS NOTE  Date of Surgery: 11/18/2011 Surgeon: Moishe Spice): Fransisco Hertz, MD 2 Days Post-Op left Carotid Endarterectomy .  HPI: Wendy Cole is a 76 y.o. female who is 2 Days Post-Op left Carotid Endarterectomy . Patient is doing well. Patient denies headache; Patient denies difficulty swallowing; denies weakness in upper or lower extremities; Pt. denies other symptoms of stroke or TIA. Pt not walking much yet Denies CP/SOB  IMAGING: No results found.  Significant Diagnostic Studies: CBC Lab Results  Component Value Date   WBC 14.0* 11/19/2011   HGB 9.6* 11/19/2011   HCT 29.9* 11/19/2011   MCV 84.7 11/19/2011   PLT 232 11/19/2011    BMET    Component Value Date/Time   NA 133* 11/19/2011 0400   K 4.1 11/19/2011 0400   CL 100 11/19/2011 0400   CO2 19 11/19/2011 0400   GLUCOSE 153* 11/19/2011 0400   GLUCOSE 81 04/09/2006 1716   BUN 25* 11/19/2011 0400   CREATININE 1.62* 11/19/2011 0400   CALCIUM 8.4 11/19/2011 0400   CALCIUM 9.6 09/27/2006 2054   GFRNONAA 30* 11/19/2011 0400   GFRAA 35* 11/19/2011 0400    COAG Lab Results  Component Value Date   INR 1.31 11/20/2011   INR 1.32 11/19/2011   INR 1.33 11/18/2011   PROTIME 21.6* 11/06/2011   PROTIME 24.0* 10/09/2011   PROTIME 27.6* 09/11/2011   No results found for this basename: PTT      Intake/Output Summary (Last 24 hours) at 11/20/11 0733 Last data filed at 11/20/11 0606  Gross per 24 hour  Intake   1600 ml  Output   1600 ml  Net      0 ml    Physical Exam:  BP Readings from Last 3 Encounters:  11/20/11 168/50  11/20/11 168/50  11/16/11 94/63   Temp Readings from Last 3 Encounters:  11/20/11 98.2 F (36.8 C) Oral  11/20/11 98.2 F (36.8 C) Oral  11/16/11 97.6 F (36.4 C)    SpO2 Readings from Last 3 Encounters:  11/20/11 95%  11/20/11 95%  11/16/11 98%   Pulse Readings from Last 3 Encounters:  11/20/11 71  11/20/11 71  11/16/11 61    Pt is A&O x  3 Speech is fluent left Neck Wound is healing well Patient with Negative tongue deviation and Negative facial droop Pt has good and equal strength in all extremities  Assessment: Wendy Cole is a 76 y.o. female is S/P Left Carotid endarterectomy Pt is voiding,needs to ambulate and take po well HTN - Still requiring IV labetalol for SBP 180's On coreg BID   Plan: Discharge to: Home once BP controlled and ambulates Appreciate Cardiology help Follow-up in 2 weeks   Larinda Herter J [phone] 11/20/2011 7:33 AM

## 2011-11-20 NOTE — Discharge Instructions (Signed)
Pt to take blood pressure twice daily before taking blood pressure medication. Pt needs to call Dr. Ricki Miller office for appointment to review meds - 1 week

## 2011-11-20 NOTE — Progress Notes (Signed)
Clinical Social Work  CSW met with patient in order to discuss dc plans. Per RN, patient has not been ambulating that often but is still wishing to return home. CSW met with patient to discuss her dc plans. Patient is refusing SNF at this time and reports she will return home with help from her husband. CSW and patient discussed patient's safety if she returns home but patient reports no concerns. CSW explained if patient became deconditioned or changed her mind regarding SNF, then CSW could be consulted. CSW made CM aware of possible HH needs. CSW is signing off but available if needed.   [place], [place] [phone]

## 2011-11-20 NOTE — Progress Notes (Signed)
Pt d/c home per MD order, d/c instructions given, prescriptions given, pt Bipap mask returned to pt and family, RN educated pt on new Coreg orders per instructions, pt and family BS

## 2011-11-20 NOTE — Progress Notes (Signed)
Pt refused walk this am. RN encouraged attempt to walk for four times.

## 2011-11-20 NOTE — Progress Notes (Signed)
Clinical Social Work  CSW received call from bedside RN stating that husband felt he would be unable to care for patient if she was unable to ambulate. Per RN, patient has refused to ambulate. CSW spoke with patient, husband and son at bedside. CSW spoke with patient and family regarding concerns. Patient continues to report that she will go home and does not need SNF. Family was able to encourage patient to get up and ambulate in the hallway. Per son, he works but when he is home he is able to assist with patient. Per husband, he can assist but only with minimal assistance and wants to make sure that patient can ambulate before she returns home. Patient was not receptive to CSW consult and asked CSW to leave the room. CSW observed RN walk with patient and patient was able to ambulate with walker. Husband spoke with CSW and reported he could handle and assist patient at home without SNF placement. CSW is signing off but available if needed.  [place], [place] [phone]

## 2011-11-20 NOTE — Progress Notes (Addendum)
[facility]  Daily Progress Note  Assessment/Planning: POD #2 s/p L CEA   BP still requiring prn IV anti-HTN: likely needs increase in her Coreg.  Appreciate Cardiology's assistance.  Will likely need close follow up in with Dr. Eden Emms for continued BP regimen titration as an outpatient.  Neurologically intact.  Inc intact without hematoma.  Continue Coumadin load  Home once BP <=160 with PO rx.    If BP improve over the afternoon, pt can trsf to floor.  Otherwise, pt needs to stay in the [facility] ICU if nitroglycerin drip needs to be restarted.  Subjective  - 2 Days Post-Op  No complaints, feeling better today  Objective Filed Vitals:   11/20/11 0500 11/20/11 0527 11/20/11 0744 11/20/11 0755  BP: 168/50 168/50    Pulse: 71     Temp:   98.7 F (37.1 C)   TempSrc:      Resp: 18     Height:      Weight:      SpO2: 95%   95%    Intake/Output Summary (Last 24 hours) at 11/20/11 0454 Last data filed at 11/20/11 0744  Gross per 24 hour  Intake   1550 ml  Output   1600 ml  Net    -50 ml    PULM  CTAB CV  RRR GI  soft, NTND VASC  L neck inc c/d/i, some echymosis without hematoma, CN 2-12 intact, MS 5/5 sym  Laboratory CBC    Component Value Date/Time   WBC 14.0* 11/19/2011 0400   WBC 5.9 11/06/2011 1321   HGB 9.6* 11/19/2011 0400   HGB 10.7* 11/06/2011 1321   HCT 29.9* 11/19/2011 0400   HCT 32.7* 11/06/2011 1321   PLT 232 11/19/2011 0400   PLT 295 11/06/2011 1321    BMET    Component Value Date/Time   NA 133* 11/19/2011 0400   K 4.1 11/19/2011 0400   CL 100 11/19/2011 0400   CO2 19 11/19/2011 0400   GLUCOSE 153* 11/19/2011 0400   GLUCOSE 81 04/09/2006 1716   BUN 25* 11/19/2011 0400   CREATININE 1.62* 11/19/2011 0400   CALCIUM 8.4 11/19/2011 0400   CALCIUM 9.6 09/27/2006 2054   GFRNONAA 30* 11/19/2011 0400   GFRAA 35* 11/19/2011 0400   Lab Results  Component Value Date   INR 1.31 11/20/2011   INR 1.32 11/19/2011   INR 1.33 11/18/2011     PROTIME 21.6* 11/06/2011   PROTIME 24.0* 10/09/2011   PROTIME 27.6* 09/11/2011    Leonides Sake, MD [facility] Office: [phone] Pager: [phone]  11/20/2011, 8:22 AM

## 2011-11-20 NOTE — Discharge Summary (Signed)
[facility] Discharge Summary   Patient ID:  Wendy Cole MRN: [id number] DOB/AGE: [birth year] 76 y.o.  Admit date: 11/18/2011 Discharge date: 11/20/2011 Date of Surgery: 11/18/2011 Surgeon: Surgeon(s): Fransisco Hertz, MD  Admission Diagnosis: LEFT ICA STENOSIS  Discharge Diagnoses:  LEFT ICA STENOSIS  Secondary Diagnoses: Past Medical History  Diagnosis Date  . OSA on CPAP   . Anemia   . Anxiety states   . Depressive disorder, not elsewhere classified   . Type II or unspecified type diabetes mellitus without mention of complication, not stated as uncontrolled   . Hyperlipidemia   . Unspecified essential hypertension   . Renal insufficiency   . Osteoarthritis   . Gout   . Iritis   . Nonischemic cardiomyopathy     EF 20 to 25% per echo 06/2011  . Chronic anticoagulation   . NSTEMI (non-ST elevated myocardial infarction) Feb 2013    06/2011 cath  Mild nonobstructive disease. No LV gram  . PAF (paroxysmal atrial fibrillation)   . Obesity   . Cancer 2010    Right breast  . DVT (deep venous thrombosis) 2010  . Stroke 2004    Left upper lobe  . Pulmonary edema 2013  . Thyroid disease 1969    Three forth of Thyroid removed  . Complication of anesthesia     "fights it", for colonoscopy  . Flash pulmonary edema     06/2011  . HOH (hard of hearing)     deaf in L completely    Procedure(s): ENDARTERECTOMY CAROTID  Discharged Condition: good  HPI:  Wendy Cole is a 76 y.o. (Feb 13, 1936) female who presents with chief complaint: "clogged arteries". The patient was evaluated for CAD and had screening carotid duplex done as part of her atherosclerosis screening. Previous carotid studies demonstrated: RICA 80-99% stenosis, LICA 80-99% stenosis. Patient has history of L temporal stroke symptom in 2004. At that point, she describes discoordination and imaging studies consistent with a L CVA. The patient has never had amaurosis fugax or monocular  blindness. The patient has never had facial drooping or hemiplegia. The patient has never had receptive or expressive aphasia. The patient's previous neurologic deficits have resolved. The patient's risks factors for carotid disease include: DM, hyperlipidemia, and prior smoking. Pt is admitted for left carotid endarterectomy   Hospital Course:  YULISA CHIRICO is a 76 y.o. female is S/P Left Procedure(s): ENDARTERECTOMY CAROTID Extubated: POD # 0 Post-op wounds healing well Pt. Ambulating, voiding and taking PO diet without difficulty. Pt pain controlled with PO pain meds. Labs as below Complications:hypertensionn requiring NTG drip, successfully weaned on POD#2 Cardiology consulted and meds increased Neurologically pt remained intact  Consults:  Treatment Team:  Rounding [facility], MD  Significant Diagnostic Studies: CBC Lab Results  Component Value Date   WBC 14.0* 11/19/2011   HGB 9.6* 11/19/2011   HCT 29.9* 11/19/2011   MCV 84.7 11/19/2011   PLT 232 11/19/2011    BMET    Component Value Date/Time   NA 133* 11/19/2011 0400   K 4.1 11/19/2011 0400   CL 100 11/19/2011 0400   CO2 19 11/19/2011 0400   GLUCOSE 153* 11/19/2011 0400   GLUCOSE 81 04/09/2006 1716   BUN 25* 11/19/2011 0400   CREATININE 1.62* 11/19/2011 0400   CALCIUM 8.4 11/19/2011 0400   CALCIUM 9.6 09/27/2006 2054   GFRNONAA 30* 11/19/2011 0400   GFRAA 35* 11/19/2011 0400   COAG Lab Results  Component Value Date   INR 1.31  11/20/2011   INR 1.32 11/19/2011   INR 1.33 11/18/2011   PROTIME 21.6* 11/06/2011   PROTIME 24.0* 10/09/2011   PROTIME 27.6* 09/11/2011     Disposition:  Discharge to :Home Discharge Orders    Future Appointments: Provider: Department: Dept Phone: Center:   11/25/2011 4:30 PM Wendall Stade, MD [facility] [phone] [facility]   12/04/2011 1:45 PM Sherrie Mustache [facility] [phone] None   12/15/2011 2:45 PM Tresa Garter, MD [facility] [phone] [facility]   01/01/2012  1:45 PM Dava Najjar Idelle Jo [facility] [phone] None   01/29/2012 1:45 PM Dava Najjar Idelle Jo [facility] [phone] None   02/26/2012 12:00 PM Krista Blue [facility] [phone] None   03/08/2012 12:00 PM Ladene Artist, MD [facility] [phone] None   04/26/2012 1:30 PM Waymon Budge, MD [facility] [phone] None     Future Orders Please Complete By Expires   Resume previous diet      Driving Restrictions      Comments:   No driving for 4 weeks   Lifting restrictions      Comments:   No lifting for 4 weeks   Call MD for:  temperature >100.5      Call MD for:  redness, tenderness, or signs of infection (pain, swelling, bleeding, redness, odor or green/yellow discharge around incision site)      Call MD for:  severe or increased pain, loss or decreased feeling  in affected limb(s)      Increase activity slowly      Comments:   Walk with assistance use walker or cane as needed   May shower       No dressing needed      may wash over wound with mild soap and water      CAROTID Sugery: Call MD for difficulty swallowing or speaking; weakness in arms or legs that is a new symtom; severe headache.  If you have increased swelling in the neck and/or  are having difficulty breathing, CALL 911         Adiana, Smelcer  Home Medication Instructions QMV:[id number]   Printed on:11/20/11 1643  Medication Information                    prednisoLONE acetate (PRED FORTE) 1 % ophthalmic suspension Place 1 drop into both eyes daily as needed. For eye pain           timolol (TIMOPTIC) 0.5 % ophthalmic solution Place 1 drop into both eyes 2 (two) times daily. Twice daily           omeprazole (PRILOSEC) 40 MG capsule Take 40 mg by mouth daily.            vitamin D, CHOLECALCIFEROL, 400 UNITS tablet Take 400 Units by mouth daily.            brimonidine (ALPHAGAN) 0.15 % ophthalmic solution Place 1 drop into both eyes daily.            buPROPion (WELLBUTRIN SR) 150  MG 12 hr tablet Take 150 mg by mouth daily.           levothyroxine (SYNTHROID, LEVOTHROID) 137 MCG tablet Take 137 mcg by mouth daily.           calcium carbonate (CALCIUM 500) 1250 MG tablet Take 1 tablet by mouth daily.           BIOTIN PO Take 1 tablet by mouth daily.  LORazepam (ATIVAN) 1 MG tablet Take 1 mg by mouth every 8 (eight) hours as needed.           torsemide (DEMADEX) 20 MG tablet Take 1 tablet (20 mg total) by mouth daily.           allopurinol (ZYLOPRIM) 100 MG tablet Take 1 tablet (100 mg total) by mouth daily.           Fluticasone-Salmeterol (ADVAIR) 100-50 MCG/DOSE AEPB Inhale 1 puff into the lungs every 12 (twelve) hours.           anastrozole (ARIMIDEX) 1 MG tablet TAKE 1 TABLET BY MOUTH ONCE DAILY           clorazepate (TRANXENE) 7.5 MG tablet Take 1 tablet (7.5 mg total) by mouth 2 (two) times daily as needed for anxiety.           warfarin (COUMADIN) 1 MG tablet Take 2-3 mg by mouth daily. Take 3mg  on Mondays, and Thursdays and take 2mg  on Tuesday, Wed. Fridays, Sat. And Sun.           oxyCODONE (OXY IR/ROXICODONE) 5 MG immediate release tablet Take 1-2 tablets (5-10 mg total) by mouth every 4 (four) hours as needed.           carvedilol (COREG) 12.5 MG tablet Take 2 tablets (25mg ) in the am; take 1 tablet (12.5mg ) in the PM  Do not take either dose (am or PM) if Systolic Blood pressure (top number) is less than 120            Verbal and written Discharge instructions given to the patient. Wound care per Discharge AVS Follow-up Information    Follow up with Nilda Simmer, MD in 2 weeks. (office will arrange-sent)    Contact information:   [address] [place] [address] [phone]       Follow up with Charlton Haws, MD. Schedule an appointment as soon as possible for a visit in 1 week. (call for appt next week)    Contact information:   [address], [address] [place]  [place] [address] [phone]          Signed: Marlowe Shores 11/20/2011, 4:43 PM  Addendum  I have independently interviewed and examined the patient, and I agree with the physician assistant's discharge summary.  This patient has severe bilateral internal carotid stenoses >  80%.  Due to higher velocities in the L ICA and prior remote stroke history, I elected to operate on the left side first.  She underwent uneventful L CEA.  Post-operatively, her poorly controlled hypertension became evident.  She required nitroglycerin drip to maintain her BP in 140-160 range.  Will additional increase in BP by [facility], her BP was eventually controlled in this range.  The patient was neurologically intact and wanted to be discharged.  She was discharged on POD #2 with planned follow up in my office in two weeks.  Additionally, she will be following up with Dr. Eden Emms for further titration of her blood pressure.   In 4-6 weeks, she will undergo the R CEA.  This staging of CEA is intentional to try to minimize the risks of cerebral hyperperfusion.  Leonides Sake, MD [facility] [place] Office: [phone] Pager: [phone]  11/20/2011, 10:27 PM

## 2011-11-20 NOTE — Progress Notes (Signed)
   TELEMETRY: Reviewed telemetry pt in NSR : Filed Vitals:   11/20/11 0500 11/20/11 0527 11/20/11 0744 11/20/11 0755  BP: 168/50 168/50    Pulse: 71     Temp:   98.7 F (37.1 C)   TempSrc:      Resp: 18     Height:      Weight:      SpO2: 95%   95%    Intake/Output Summary (Last 24 hours) at 11/20/11 0847 Last data filed at 11/20/11 0744  Gross per 24 hour  Intake   1550 ml  Output   1600 ml  Net    -50 ml    SUBJECTIVE No complaints. Denies chest pain or SOB. Blood pressure up this am. She thinks this is because cuff is too tight.  LABS: Basic Metabolic Panel:  Basename 11/19/11 0400 11/18/11 1532  NA 133* --  K 4.1 --  CL 100 --  CO2 19 --  GLUCOSE 153* --  BUN 25* --  CREATININE 1.62* 1.54*  CALCIUM 8.4 --  MG -- --  PHOS -- --   CBC:  Basename 11/19/11 0400 11/18/11 1532  WBC 14.0* 7.3  NEUTROABS -- --  HGB 9.6* 9.5*  HCT 29.9* 28.8*  MCV 84.7 83.2  PLT 232 235    Radiology/Studies:  Dg Chest 2 View  11/16/2011  *RADIOLOGY REPORT*  Clinical Data: Preoperative evaluation for left carotid endarterectomy.  CHEST - 2 VIEW  Comparison: Chest x-ray 07/14/2011.  Findings: Lungs appear mildly hyperexpanded.  No acute consolidative air space disease.  No definite pleural effusions. Pulmonary vasculature is normal.  Heart size is mildly enlarged (unchanged).  Left mid lung linear opacities most compatible with an area of scarring.  Mediastinal contours are unremarkable. Atherosclerotic calcifications are noted within the arch of the aorta.  IMPRESSION: 1.  No radiographic evidence of acute cardiopulmonary disease. 2.  Mild hyperexpansion of the lungs is similar to prior examinations. 3.  Mild cardiomegaly. 4.  Atherosclerosis. 5.  Scarring in the lingula is unchanged.  Original Report Authenticated By: Florencia Reasons, M.D.    PHYSICAL EXAM General: no acute distress. Head: Normal Neck: Left CEA scar. JVD not elevated. Lungs: Clear bilaterally to  auscultation without wheezes, rales, or rhonchi. Breathing is unlabored. Heart: RRR S1 S2 without murmurs, rubs, or gallops.  Abdomen: Soft, non-tender, non-distended with normoactive bowel sounds. No hepatomegaly. No rebound/guarding. No obvious abdominal masses. Msk:  Strength and tone appears normal for age. Extremities: No clubbing, cyanosis or edema.  Distal pedal pulses are 2+ and equal bilaterally. Neuro: Alert and oriented X 3. Moves all extremities spontaneously. Psych:  Responds to questions appropriately with a normal affect.  ASSESSMENT AND PLAN: 1. HTN previously well controlled pre op. Would increase carvedilol to 25 mg bid for now. Can adjust further as outpatient.  2. S/p left CEA  3. Pafib. NSR on monitor  Plan: OK for discharge today from cardiac standpoint on higher coreg dose. Would arrange follow up in our office next week.  Active Problems:  Essential hypertension, benign    Signed, Jasn Xia [place] MD,FACC 11/20/2011 8:51 AM

## 2011-11-20 NOTE — Progress Notes (Deleted)
Utilization review completed.  

## 2011-11-20 NOTE — Care Management Note (Signed)
    Page 1 of 2   11/20/2011     3:02:09 PM   CARE MANAGEMENT NOTE 11/20/2011  Patient:  Wendy Cole, Wendy Cole   Account Number:  [id number]  Date Initiated:  11/18/2011  Documentation initiated by:  Donn Pierini  Subjective/Objective Assessment:   Pt admitted s/p  carotid endarterectomy     Action/Plan:   PTA pt lived at home with spouse   Anticipated DC Date:  11/20/2011   Anticipated DC Plan:  HOME/SELF CARE      DC Planning Services  CM consult      [facility] Choice  HOME HEALTH   Choice offered to / List presented to:  C-1 Patient        HH arranged  HH - 11 Patient Refused      Status of service:  Completed, signed off Medicare Important Message given?   (If response is "NO", the following Medicare IM given date fields will be blank) Date Medicare IM given:   Date Additional Medicare IM given:    Discharge Disposition:  HOME/SELF CARE  Per UR Regulation:  Reviewed for med. necessity/level of care/duration of stay  If discussed at Long Length of Stay Meetings, dates discussed:    Comments:  PCP- Tresa Garter  11/20/11- 1400- Donn Pierini RN, BSN [phone] Spoke with pt and husband at bedside regarding d/c needs. Order for HH-PT- per conversation with pt and spouse, they do not feel that pt needs HH at this time. State that pt is functioning close to baseline- pt has walker and cane at home- pt lives in a one story home with one step to enter- pt states that she feels she will do "just fine" and pt's spouse agrees that he feels pt is safe to return home and that he and his son who lives there can manage pt at discharge. HH services have been declined. Did discuss with pt and spouse that if pt gets home and feels she needs HH after all she will need to contact her PCP regarding having HH services arranged.  11/18/11- 1640- Donn Pierini RN, BSN [phone] UR completed, pt admitted from PACU- NCM to follow post op for d/c needs.

## 2011-11-22 LAB — TYPE AND SCREEN
ABO/RH(D): O NEG
Antibody Screen: POSITIVE
Donor AG Type: NEGATIVE
Donor AG Type: NEGATIVE
Unit division: 0

## 2011-11-25 ENCOUNTER — Encounter: Payer: Self-pay | Admitting: Cardiovascular Disease

## 2011-11-25 ENCOUNTER — Encounter: Payer: Medicare Other | Admitting: Cardiovascular Disease

## 2011-11-25 ENCOUNTER — Other Ambulatory Visit: Payer: Self-pay | Admitting: Internal Medicine

## 2011-11-25 ENCOUNTER — Ambulatory Visit (INDEPENDENT_AMBULATORY_CARE_PROVIDER_SITE_OTHER): Payer: Medicare Other | Admitting: Cardiovascular Disease

## 2011-11-25 VITALS — BP 185/70 | HR 70 | Wt 222.0 lb

## 2011-11-25 DIAGNOSIS — I658 Occlusion and stenosis of other precerebral arteries: Secondary | ICD-10-CM

## 2011-11-25 DIAGNOSIS — E785 Hyperlipidemia, unspecified: Secondary | ICD-10-CM

## 2011-11-25 DIAGNOSIS — I428 Other cardiomyopathies: Secondary | ICD-10-CM

## 2011-11-25 DIAGNOSIS — I10 Essential (primary) hypertension: Secondary | ICD-10-CM

## 2011-11-25 DIAGNOSIS — I6523 Occlusion and stenosis of bilateral carotid arteries: Secondary | ICD-10-CM

## 2011-11-25 DIAGNOSIS — Z79899 Other long term (current) drug therapy: Secondary | ICD-10-CM

## 2011-11-25 DIAGNOSIS — I4891 Unspecified atrial fibrillation: Secondary | ICD-10-CM

## 2011-11-25 MED ORDER — LOSARTAN POTASSIUM 50 MG PO TABS: 50.0000 mg | ORAL_TABLET | Freq: Every day | ORAL | Status: DC

## 2011-11-25 NOTE — Telephone Encounter (Signed)
Ok to [employer]? Chart says it was D/C'd= error ??

## 2011-11-25 NOTE — Assessment & Plan Note (Signed)
Maint NSR history of CVA in 2004  On chronic coumadin  F/U clinic

## 2011-11-25 NOTE — Assessment & Plan Note (Signed)
With HTN and low EF add Cozaar 50mg   Check Cr in 3 weeks and [place] to R/O RAS.  Baseline Cr is 1.6-1.8

## 2011-11-25 NOTE — Assessment & Plan Note (Signed)
Cholesterol is at goal.  Continue current dose of statin and diet Rx.  No myalgias or side effects.  F/U  LFT's in 6 months. Lab Results  Component Value Date   LDLCALC  Value: 154        Total Cholesterol/HDL:CHD Risk Coronary Heart Disease Risk Table                     Men   Women  1/2 Average Risk   3.4   3.3  Average Risk       5.0   4.4  2 X Average Risk   9.6   7.1  3 X Average Risk  23.4   11.0        Use the calculated Patient Ratio above and the CHD Risk Table to determine the patient's CHD Risk.        ATP III CLASSIFICATION (LDL):  <100     mg/dL   Optimal  100-129  mg/dL   Near or Above                    Optimal  130-159  mg/dL   Borderline  160-189  mg/dL   High  >190     mg/dL   Very High* 02/01/2009             

## 2011-11-25 NOTE — Patient Instructions (Addendum)
Your physician wants you to follow-up in:  3 MONTHS WITH DR Haywood Filler will receive a reminder letter in the mail two months in advance. If you don't receive a letter, please call our office to schedule the follow-up appointment. Your physician has recommended you make the following change in your medication: START LOSARTAN 50 MG EVERY DAY Your physician has requested that you have a renal artery duplex. During this test, an ultrasound is used to evaluate blood flow to the kidneys. Allow one hour for this exam. Do not eat after midnight the day before and avoid carbonated beverages. Take your medications as you usually do. DX 401.1 Your physician recommends that you return for lab work in:  3 WEEKS  BMET  DX V58.69 NEEDS F/U WITH DR  Imogene Burn PER DR Eden Emms

## 2011-11-25 NOTE — Progress Notes (Signed)
[name], female   DOB: [birth year], 76 y.o.   MRN: [id number] Wendy Cole is a 76 y/o F with hx of NICM, PAF, OSA, HTN, prior DVT, renal insufficiency who presented to [facility] 11/18/11 for planned L carotid endarterectomy. Carotid duplex on 11/10/11 showed bilateral 80-99% ICA stenosis it appears eventual plan is for staged R intervention. She is normally seen by Dr [place] and saw Dr Tenny Craw in consultation at the hospital 6/26 for post op hypertension.  She had a heart cath showing mild nonobstructive disease with normal right heart and left ventricular filling pressures. Her echo showed an EF of 25 to 30%. She will be managed medically.   Last echo 2/18  Study Conclusions  - Left ventricle: Systolic function was severely reduced. The estimated ejection fraction was in the range of 25% to 30%. Features are consistent with a pseudonormal left ventricular filling pattern, with concomitant abnormal relaxation and increased filling pressure (grade 2 diastolic dysfunction). Doppler parameters are consistent with elevated mean left atrial filling pressure. - Regional wall motion abnormality: Akinesis of the basal-mid anteroseptal myocardium; hypokinesis of the entire anterior, mid inferolateral, basal-mid anterolateral, and apical lateral myocardium. - Aortic valve: Mild regurgitation. - Mitral valve: Mild regurgitation. - Left atrium: The atrium was moderately dilated. - Right atrium: The atrium was mildly dilated. - Pulmonary arteries: PA peak pressure: 49mm Hg (S). Impressions:  - The right ventricular systolic pressure was increased consistent with mild pulmonary hypertension.  Post op has some pain in left neck and headache.  Has not been taking higher dose of coreg  ROS: Denies fever, malais, weight loss, blurry vision, decreased visual acuity, cough, sputum, SOB, hemoptysis, pleuritic pain, palpitaitons, heartburn, abdominal pain, melena, lower extremity edema,  claudication, or rash.  All other systems reviewed and negative  General: Affect appropriate Healthy:  appears stated age HEENT: normal Neck supple with no adenopathy JVP normal Left CEA with small hematoma bilateral bruits no thyromegaly Lungs clear with no wheezing and good diaphragmatic motion Heart:  S1/S2 no murmur, no rub, gallop or click PMI normal Abdomen: benighn, BS positve, no tenderness, no AAA no bruit.  No HSM or HJR Distal pulses intact with no bruits No edema Neuro non-focal Skin warm and dry No muscular weakness   Current Outpatient Prescriptions  Medication Sig Dispense Refill  . allopurinol (ZYLOPRIM) 100 MG tablet Take 1 tablet (100 mg total) by mouth daily.  90 tablet  3  . anastrozole (ARIMIDEX) 1 MG tablet TAKE 1 TABLET BY MOUTH ONCE DAILY  90 tablet  1  . BIOTIN PO Take 1 tablet by mouth daily.      . brimonidine (ALPHAGAN) 0.15 % ophthalmic solution Place 1 drop into both eyes daily.       [name] buPROPion (WELLBUTRIN SR) 150 MG 12 hr tablet Take 150 mg by mouth daily.      . calcium carbonate (CALCIUM 500) 1250 MG tablet Take 1 tablet by mouth daily.      . carvedilol (COREG) 12.5 MG tablet Take 2 tablets (25mg ) in the am; take 1 tablet (12.5mg ) in the PM  Do not take either dose (am or PM) if Systolic Blood pressure (top number) is less than 120  62 tablet  3  . clorazepate (TRANXENE) 7.5 MG tablet Take 1 tablet (7.5 mg total) by mouth 2 (two) times daily as needed for anxiety.  30 tablet  0  . Fluticasone-Salmeterol (ADVAIR) 100-50 MCG/DOSE AEPB Inhale 1 puff into the lungs every  12 (twelve) hours.  60 each  5  . levothyroxine (SYNTHROID, LEVOTHROID) 137 MCG tablet Take 137 mcg by mouth daily.      [name] LORazepam (ATIVAN) 1 MG tablet Take 1 mg by mouth every 8 (eight) hours as needed.      [name] omeprazole (PRILOSEC) 40 MG capsule Take 40 mg by mouth daily.       [name] oxyCODONE (OXY IR/ROXICODONE) 5 MG immediate release tablet Take 1-2 tablets (5-10 mg total) by mouth  every 4 (four) hours as needed.  30 tablet  0  . prednisoLONE acetate (PRED FORTE) 1 % ophthalmic suspension Place 1 drop into both eyes daily as needed. For eye pain      . timolol (TIMOPTIC) 0.5 % ophthalmic solution Place 1 drop into both eyes 2 (two) times daily. Twice daily      . torsemide (DEMADEX) 20 MG tablet Take 1 tablet (20 mg total) by mouth daily.  30 tablet  6  . vitamin D, CHOLECALCIFEROL, 400 UNITS tablet Take 400 Units by mouth daily.       [name] warfarin (COUMADIN) 1 MG tablet Take 2-3 mg by mouth daily. Take 3mg  on Mondays, and Thursdays and take 2mg  on Tuesday, Wed. Fridays, Sat. And Sun.        Allergies  Atorvastatin; Colesevelam; Lasix; Statins; and Tape  Electrocardiogram:  6/27  SR rate 73  Q 3,F LVH  Assessment and Plan

## 2011-11-25 NOTE — Assessment & Plan Note (Signed)
Called Dr Layla Maw office to schedule F/U Staged R CEA in 4 weeks or so  Will need coumadin held again  Try to control BP better before surgery

## 2011-12-03 ENCOUNTER — Other Ambulatory Visit: Payer: Self-pay | Admitting: *Deleted

## 2011-12-03 DIAGNOSIS — I4891 Unspecified atrial fibrillation: Secondary | ICD-10-CM

## 2011-12-04 ENCOUNTER — Other Ambulatory Visit (HOSPITAL_BASED_OUTPATIENT_CLINIC_OR_DEPARTMENT_OTHER): Payer: Medicare Other | Admitting: Lab

## 2011-12-04 ENCOUNTER — Telehealth: Payer: Self-pay | Admitting: *Deleted

## 2011-12-04 ENCOUNTER — Other Ambulatory Visit: Payer: Self-pay | Admitting: *Deleted

## 2011-12-04 DIAGNOSIS — I82409 Acute embolism and thrombosis of unspecified deep veins of unspecified lower extremity: Secondary | ICD-10-CM

## 2011-12-04 DIAGNOSIS — I4891 Unspecified atrial fibrillation: Secondary | ICD-10-CM

## 2011-12-04 DIAGNOSIS — Z7901 Long term (current) use of anticoagulants: Secondary | ICD-10-CM

## 2011-12-04 DIAGNOSIS — I82509 Chronic embolism and thrombosis of unspecified deep veins of unspecified lower extremity: Secondary | ICD-10-CM

## 2011-12-04 DIAGNOSIS — Z5181 Encounter for therapeutic drug level monitoring: Secondary | ICD-10-CM

## 2011-12-04 LAB — PROTIME-INR: INR: 1.8 — ABNORMAL LOW (ref 2.00–3.50)

## 2011-12-04 NOTE — Telephone Encounter (Signed)
Pt notified to stay on same dose of coumadin = 2 mg daily except 3 mg mon & thurs & return 01/01/12 for PT/INR.  She is able to repeat this message back.

## 2011-12-09 ENCOUNTER — Other Ambulatory Visit (INDEPENDENT_AMBULATORY_CARE_PROVIDER_SITE_OTHER): Payer: Medicare Other

## 2011-12-09 DIAGNOSIS — N184 Chronic kidney disease, stage 4 (severe): Secondary | ICD-10-CM

## 2011-12-09 DIAGNOSIS — M353 Polymyalgia rheumatica: Secondary | ICD-10-CM

## 2011-12-09 DIAGNOSIS — I428 Other cardiomyopathies: Secondary | ICD-10-CM

## 2011-12-09 DIAGNOSIS — E785 Hyperlipidemia, unspecified: Secondary | ICD-10-CM

## 2011-12-09 DIAGNOSIS — I10 Essential (primary) hypertension: Secondary | ICD-10-CM

## 2011-12-09 DIAGNOSIS — E119 Type 2 diabetes mellitus without complications: Secondary | ICD-10-CM

## 2011-12-09 LAB — BASIC METABOLIC PANEL
Calcium: 9.7 mg/dL (ref 8.4–10.5)
Creatinine, Ser: 2 mg/dL — ABNORMAL HIGH (ref 0.4–1.2)
GFR: 25.9 mL/min — ABNORMAL LOW (ref >60.00)
Sodium: 139 mEq/L (ref 135–145)

## 2011-12-09 LAB — HEMOGLOBIN A1C: Hgb A1c MFr Bld: 6.3 % (ref 4.6–6.5)

## 2011-12-09 LAB — URIC ACID: Uric Acid, Serum: 11.4 mg/dL — ABNORMAL HIGH (ref 2.4–7.0)

## 2011-12-10 ENCOUNTER — Encounter: Payer: Self-pay | Admitting: Vascular Surgery

## 2011-12-11 ENCOUNTER — Encounter: Payer: Self-pay | Admitting: Vascular Surgery

## 2011-12-11 ENCOUNTER — Ambulatory Visit (INDEPENDENT_AMBULATORY_CARE_PROVIDER_SITE_OTHER): Payer: Medicare Other | Admitting: Vascular Surgery

## 2011-12-11 VITALS — BP 160/80 | HR 64 | Temp 97.8°F | Ht 64.0 in | Wt 219.0 lb

## 2011-12-11 DIAGNOSIS — I6529 Occlusion and stenosis of unspecified carotid artery: Secondary | ICD-10-CM | POA: Insufficient documentation

## 2011-12-11 NOTE — Progress Notes (Signed)
[facility] [place]  Postoperative Visit  History of Present Illness  Wendy Cole is a 76 y.o. female who presents for postoperative follow-up for: L CEA (Date: 11/19/11).  The patient's neck incision is healed.  The patient has had no stroke or TIA symptoms.  Pt only notes persistent headache in front area which worsens as the day continues.  The patient attributes her headache to her teeth grinding.  Physical Examination  Filed Vitals:   12/11/11 1516 12/11/11 1518 12/11/11 1554  BP: 200/79 185/78 160/80  Pulse: 64 64   Temp: 97.8 F (36.6 C)    TempSrc: Oral    Height: 5\' 4"  (1.626 m)    Weight: 219 lb (99.338 kg)    SpO2: 97%     L Neck: Incision is healing, one area of erythema in the lower incision may be consistent with an early suture granuloma  Neuro: CN 2-12 are intact , Motor strength is 5/5 bilaterally, sensation is grossly intact  Medical Decision Making  Wendy Cole is a 76 y.o. female who presents s/p L CEA w/ RICA stenosis > 80%  The patient's neck incision is healing with no stroke symptoms.  I don't think this patient's headache sx are c/w cerebral hyperperfusion, especially as it usually develops within the first two weeks post-op.  Her escalating blood pressures are concerning, and in the context the patient is describing, I would agree that a renal artery duplex is in order to screen for RAS. I discussed in depth with the patient the nature of atherosclerosis, and emphasized the importance of maximal medical management including strict control of blood pressure, blood glucose, and lipid levels, obtaining regular exercise, and cessation of smoking.  The patient is aware that without maximal medical management the underlying atherosclerotic disease process will progress, limiting the benefit of any interventions. The patient also has a >80% stenosis in RICA.  We are scheduling her R CEA for 20 AUG 13.  Thank you for allowing  [place] to participate in this patient's care.  Leonides Sake, MD [facility] [place] Office: [phone] Pager: [phone]

## 2011-12-15 ENCOUNTER — Encounter: Payer: Self-pay | Admitting: Internal Medicine

## 2011-12-15 ENCOUNTER — Ambulatory Visit (INDEPENDENT_AMBULATORY_CARE_PROVIDER_SITE_OTHER): Payer: Medicare Other | Admitting: Internal Medicine

## 2011-12-15 VITALS — BP 160/80 | HR 72 | Temp 97.5°F | Resp 16 | Wt 223.0 lb

## 2011-12-15 DIAGNOSIS — E785 Hyperlipidemia, unspecified: Secondary | ICD-10-CM

## 2011-12-15 DIAGNOSIS — I4891 Unspecified atrial fibrillation: Secondary | ICD-10-CM

## 2011-12-15 DIAGNOSIS — I6523 Occlusion and stenosis of bilateral carotid arteries: Secondary | ICD-10-CM

## 2011-12-15 DIAGNOSIS — E119 Type 2 diabetes mellitus without complications: Secondary | ICD-10-CM

## 2011-12-15 DIAGNOSIS — E1169 Type 2 diabetes mellitus with other specified complication: Secondary | ICD-10-CM

## 2011-12-15 DIAGNOSIS — I658 Occlusion and stenosis of other precerebral arteries: Secondary | ICD-10-CM

## 2011-12-15 DIAGNOSIS — R600 Localized edema: Secondary | ICD-10-CM

## 2011-12-15 DIAGNOSIS — R609 Edema, unspecified: Secondary | ICD-10-CM

## 2011-12-15 DIAGNOSIS — E1159 Type 2 diabetes mellitus with other circulatory complications: Secondary | ICD-10-CM

## 2011-12-15 DIAGNOSIS — N184 Chronic kidney disease, stage 4 (severe): Secondary | ICD-10-CM

## 2011-12-15 DIAGNOSIS — I509 Heart failure, unspecified: Secondary | ICD-10-CM

## 2011-12-15 DIAGNOSIS — I10 Essential (primary) hypertension: Secondary | ICD-10-CM

## 2011-12-15 MED ORDER — AMLODIPINE BESYLATE 5 MG PO TABS: 5.0000 mg | ORAL_TABLET | Freq: Every day | ORAL | Status: DC

## 2011-12-15 NOTE — Assessment & Plan Note (Signed)
B carotid stenosis 2013 L repair Dr Imogene Burn

## 2011-12-15 NOTE — Assessment & Plan Note (Signed)
Continue with current prescription therapy as reflected on the Med list.  

## 2011-12-15 NOTE — Patient Instructions (Addendum)
Start Amlodipine at 2.5 mg/day

## 2011-12-15 NOTE — Assessment & Plan Note (Signed)
Resolved

## 2011-12-15 NOTE — Assessment & Plan Note (Signed)
Monitoring

## 2011-12-15 NOTE — Assessment & Plan Note (Signed)
Start Amlodipine at 2.5 mg/d

## 2011-12-15 NOTE — Progress Notes (Signed)
Subjective:    [name], female    DOB: Oct 17, 1935, 76 y.o.   MRN: [id number]  HPI   F/u L carotid repair The patient presents for a follow-up of  chronic hypertension, chronic dyslipidemia, type 2 diabetes, h/o CHF controlled with medicines   Wt Readings from Last 3 Encounters:  12/15/11 223 lb (101.152 kg)  12/11/11 219 lb (99.338 kg)  11/25/11 222 lb (100.699 kg)   BP Readings from Last 3 Encounters:  12/15/11 160/80  12/11/11 160/80  11/25/11 185/70        Review of Systems  Constitutional: Negative for chills, activity change, appetite change, fatigue and unexpected weight change.  HENT: Negative for congestion, mouth sores and sinus pressure.   Eyes: Negative for visual disturbance.  Respiratory: Negative for cough and chest tightness.   Gastrointestinal: Negative for nausea and abdominal pain.  Genitourinary: Negative for frequency, difficulty urinating and vaginal pain.  Musculoskeletal: Positive for back pain, arthralgias and gait problem. Negative for joint swelling.  Skin: Negative for pallor and rash.  Neurological: Negative for dizziness, tremors, weakness, numbness and headaches.  Psychiatric/Behavioral: Negative for suicidal ideas, confusion and disturbed wake/sleep cycle.        Objective:   Physical Exam  Constitutional: She appears well-developed. No distress.  HENT:  Head: Normocephalic.  Right Ear: External ear normal.  Left Ear: External ear normal.  Nose: Nose normal.  Mouth/Throat: Oropharynx is clear and moist.  Eyes: Conjunctivae are normal. Pupils are equal, round, and reactive to light. Right eye exhibits no discharge. Left eye exhibits no discharge.  Neck: Normal range of motion. Neck supple. No JVD present. No tracheal deviation present. No thyromegaly present.  Cardiovascular: Normal rate, regular rhythm and normal heart sounds.   Pulmonary/Chest: No stridor. No respiratory distress. She has no wheezes.    Abdominal: Soft. Bowel sounds are normal. She exhibits no distension and no mass. There is no tenderness. There is no rebound and no guarding.  Musculoskeletal: She exhibits no edema and no tenderness.  Lymphadenopathy:    She has no cervical adenopathy.  Neurological: She displays normal reflexes. No cranial nerve deficit. She exhibits normal muscle tone. Coordination normal.       cane  Skin: No rash noted. No erythema.  Psychiatric: She has a normal mood and affect. Her behavior is normal. Judgment and thought content normal.    Lab Results  Component Value Date   WBC 14.0* 11/19/2011   HGB 9.6* 11/19/2011   HCT 29.9* 11/19/2011   PLT 232 11/19/2011   GLUCOSE 125* 12/09/2011   CHOL 221* 09/09/2011   TRIG 286.0* 09/09/2011   HDL 32.10* 09/09/2011   LDLDIRECT 144.6 09/09/2011   LDLCALC  Value: 154        Total Cholesterol/HDL:CHD Risk Coronary Heart Disease Risk Table                     Men   Women  1/2 Average Risk   3.4   3.3  Average Risk       5.0   4.4  2 X Average Risk   9.6   7.1  3 X Average Risk  23.4   11.0        Use the calculated Patient Ratio above and the CHD Risk Table to determine the patient's CHD Risk.        ATP III CLASSIFICATION (LDL):  <100     mg/dL   Optimal  629-528  mg/dL  Near or Above                    Optimal  130-159  mg/dL   Borderline  119-147  mg/dL   High  >829     mg/dL   Very High* 5/62/1308   ALT 13 11/16/2011   AST 21 11/16/2011   NA 139 12/09/2011   K 5.4* 12/09/2011   CL 107 12/09/2011   CREATININE 2.0* 12/09/2011   BUN 34* 12/09/2011   CO2 22 12/09/2011   TSH 0.92 09/09/2011   INR 1.80* 12/04/2011   HGBA1C 6.3 12/09/2011   MICROALBUR 3.1* 09/27/2006          Assessment & Plan:

## 2011-12-17 ENCOUNTER — Encounter (INDEPENDENT_AMBULATORY_CARE_PROVIDER_SITE_OTHER): Payer: Medicare Other

## 2011-12-17 ENCOUNTER — Other Ambulatory Visit (INDEPENDENT_AMBULATORY_CARE_PROVIDER_SITE_OTHER): Payer: Medicare Other

## 2011-12-17 DIAGNOSIS — Z79899 Other long term (current) drug therapy: Secondary | ICD-10-CM

## 2011-12-17 DIAGNOSIS — I10 Essential (primary) hypertension: Secondary | ICD-10-CM

## 2011-12-17 LAB — BASIC METABOLIC PANEL
Chloride: 108 mEq/L (ref 96–112)
GFR: 31.03 mL/min — ABNORMAL LOW (ref >60.00)
Potassium: 5 mEq/L (ref 3.5–5.1)
Sodium: 136 mEq/L (ref 135–145)

## 2011-12-20 ENCOUNTER — Other Ambulatory Visit: Payer: Self-pay | Admitting: Internal Medicine

## 2011-12-22 ENCOUNTER — Telehealth: Payer: Self-pay | Admitting: Internal Medicine

## 2011-12-22 NOTE — Telephone Encounter (Signed)
Wendy Cole, please, inform patient that all labs are better, potassium is normal Thx

## 2011-12-22 NOTE — Telephone Encounter (Signed)
Pt informed

## 2011-12-24 ENCOUNTER — Other Ambulatory Visit: Payer: Self-pay

## 2011-12-29 ENCOUNTER — Telehealth: Payer: Self-pay | Admitting: Internal Medicine

## 2011-12-29 ENCOUNTER — Encounter (HOSPITAL_COMMUNITY): Payer: Self-pay | Admitting: Pharmacy Technician

## 2011-12-29 MED ORDER — AMLODIPINE BESYLATE 5 MG PO TABS: 5.0000 mg | ORAL_TABLET | Freq: Every day | ORAL | Status: DC

## 2011-12-29 MED ORDER — CARVEDILOL 25 MG PO TABS: 25.0000 mg | ORAL_TABLET | Freq: Two times a day (BID) | ORAL | Status: DC

## 2011-12-29 NOTE — Telephone Encounter (Signed)
Pt's BP record: SBP= 170-202  Stacey, Pls call: start Coreg 25 mg bid and start Amlodipine 5 mg a day  Keep ROV  Thx

## 2011-12-30 MED ORDER — DOXAZOSIN MESYLATE 2 MG PO TABS: 2.0000 mg | ORAL_TABLET | Freq: Every day | ORAL | Status: DC

## 2011-12-30 NOTE — Telephone Encounter (Signed)
Pt informed. She states she cant take Amlodipine due to LE edema and she is not going to take it. She is allergic to furosemide as well. Please advise

## 2011-12-30 NOTE — Telephone Encounter (Signed)
Left detailed mess informing pt of below.  

## 2011-12-30 NOTE — Telephone Encounter (Signed)
Noted. I marked the chart re Norvasc intolerance Start Doxazosin 1 at night Thx

## 2012-01-01 ENCOUNTER — Telehealth: Payer: Self-pay | Admitting: *Deleted

## 2012-01-01 ENCOUNTER — Other Ambulatory Visit (HOSPITAL_BASED_OUTPATIENT_CLINIC_OR_DEPARTMENT_OTHER): Payer: Medicare Other | Admitting: Lab

## 2012-01-01 ENCOUNTER — Other Ambulatory Visit: Payer: Self-pay | Admitting: Nurse Practitioner

## 2012-01-01 DIAGNOSIS — N184 Chronic kidney disease, stage 4 (severe): Secondary | ICD-10-CM

## 2012-01-01 DIAGNOSIS — E119 Type 2 diabetes mellitus without complications: Secondary | ICD-10-CM

## 2012-01-01 DIAGNOSIS — I10 Essential (primary) hypertension: Secondary | ICD-10-CM

## 2012-01-01 DIAGNOSIS — I6523 Occlusion and stenosis of bilateral carotid arteries: Secondary | ICD-10-CM

## 2012-01-01 DIAGNOSIS — E785 Hyperlipidemia, unspecified: Secondary | ICD-10-CM

## 2012-01-01 DIAGNOSIS — R600 Localized edema: Secondary | ICD-10-CM

## 2012-01-01 DIAGNOSIS — I4891 Unspecified atrial fibrillation: Secondary | ICD-10-CM

## 2012-01-01 DIAGNOSIS — I82409 Acute embolism and thrombosis of unspecified deep veins of unspecified lower extremity: Secondary | ICD-10-CM

## 2012-01-01 DIAGNOSIS — I509 Heart failure, unspecified: Secondary | ICD-10-CM

## 2012-01-01 LAB — PROTIME-INR
INR: 1.3 — ABNORMAL LOW (ref 2.00–3.50)
Protime: 15.6 Seconds — ABNORMAL HIGH (ref 10.6–13.4)

## 2012-01-01 NOTE — Telephone Encounter (Signed)
Left message on voicemail requesting pt to call office. Need to know if she has missed any Coumadin doses or had any med/ diet changes.

## 2012-01-01 NOTE — Telephone Encounter (Signed)
Pt returned call, she reports she has been eating more fresh fruits and vegetables. Has had some changes in BP medications. Pt was given written information on foods high in Vit K when in the lab today. She wrote and read back instructions to change Coumadin dose to 3 mg every day except 2mg  on MWF.

## 2012-01-04 ENCOUNTER — Telehealth: Payer: Self-pay | Admitting: Oncology

## 2012-01-04 NOTE — Telephone Encounter (Signed)
S/w the pt's husband and he is aware of the aug lab appt on 01/08/2012

## 2012-01-04 NOTE — Telephone Encounter (Signed)
lmonvm adviisng the pt of her lab appt on 01/08/2012

## 2012-01-05 ENCOUNTER — Other Ambulatory Visit: Payer: Self-pay | Admitting: *Deleted

## 2012-01-05 ENCOUNTER — Telehealth: Payer: Self-pay | Admitting: *Deleted

## 2012-01-05 NOTE — Telephone Encounter (Signed)
Patient having right CEA on 01/12/12 by Dr. Gomez Cleverly. Needs to stop Coumadin 3 days prior if OK with Dr. Truett Perna. Will patient still need to check INR on 8/16 as scheduled? Per Dr. Truett Perna: OK to hold Coumadin for 3 days prior. Cancel 8/16 lab. Resume Coumadin at prior current dose night after procedure or per Dr. Claudie Fisherman order. Recheck INR 1 week later. Gave instructions to Darel Hong and she will contact patient's husband. POF to scheduler.

## 2012-01-06 ENCOUNTER — Telehealth: Payer: Self-pay | Admitting: Oncology

## 2012-01-06 ENCOUNTER — Encounter (HOSPITAL_COMMUNITY): Payer: Self-pay

## 2012-01-06 ENCOUNTER — Encounter (HOSPITAL_COMMUNITY)
Admission: RE | Admit: 2012-01-06 | Discharge: 2012-01-06 | Disposition: A | Discharge status: Home / Self Care | Payer: Medicare Other | Source: Ambulatory Visit | Attending: Vascular Surgery | Admitting: Vascular Surgery

## 2012-01-06 HISTORY — DX: Hypothyroidism, unspecified: E03.9

## 2012-01-06 HISTORY — DX: Gastro-esophageal reflux disease without esophagitis: K21.9

## 2012-01-06 LAB — CBC
Platelets: 255 10*3/uL (ref 150–400)
RBC: 3.87 MIL/uL (ref 3.87–5.11)
RDW: 14.9 % (ref 11.5–15.5)
WBC: 7.5 10*3/uL (ref 4.0–10.5)

## 2012-01-06 LAB — COMPREHENSIVE METABOLIC PANEL
ALT: 15 U/L (ref 0–35)
AST: 19 U/L (ref 0–37)
Albumin: 3.6 g/dL (ref 3.5–5.2)
CO2: 26 mEq/L (ref 19–32)
Chloride: 106 mEq/L (ref 96–112)
Creatinine, Ser: 1.57 mg/dL — ABNORMAL HIGH (ref 0.50–1.10)
GFR calc non Af Amer: 32 mL/min — ABNORMAL LOW (ref >90)
Sodium: 140 mEq/L (ref 135–145)
Total Bilirubin: 0.2 mg/dL — ABNORMAL LOW (ref 0.3–1.2)

## 2012-01-06 LAB — URINE MICROSCOPIC-ADD ON

## 2012-01-06 LAB — PROTIME-INR
INR: 1.7 — ABNORMAL HIGH (ref 0.00–1.49)
Prothrombin Time: 20.3 seconds — ABNORMAL HIGH (ref 11.6–15.2)

## 2012-01-06 LAB — URINALYSIS, ROUTINE W REFLEX MICROSCOPIC
Glucose, UA: NEGATIVE mg/dL
Hgb urine dipstick: NEGATIVE
Ketones, ur: NEGATIVE mg/dL
Protein, ur: 100 mg/dL — AB
Urobilinogen, UA: 0.2 mg/dL (ref 0.0–1.0)

## 2012-01-06 NOTE — Progress Notes (Signed)
Patient informed Nurse that she had a stress test several years ago at [facility] and a cardiac cath on 07/20/11 and results are in EPIC. Patient also has sleep apnea and wears a CPAP at bedtime.

## 2012-01-06 NOTE — Pre-Procedure Instructions (Signed)
20 Wendy Cole  01/06/2012   Your procedure is scheduled on:  Tuesday January 12, 2012.  Report to [facility] at 0530 AM.  Call this number if you have problems the morning of surgery: [phone]   Remember:   Do not eat food or drink:After Midnight.    Take these medicines the morning of surgery with A SIP OF WATER: Bupropion (Wellbutin), Carvedilol (Coreg) Clorazepate (Tranxene) if needed for anxiety, Advair inhaler if needed for shortness of breath, Levothyroxine (Synthroid), Lorazepam (Ativan), and Omeprazole (Prilosec).   Do not wear jewelry, make-up or nail polish.  Do not wear lotions, powders, or perfumes.   Do not shave 48 hours prior to surgery.   Do not bring valuables to the hospital.  Contacts, dentures or bridgework may not be worn into surgery.  Leave suitcase in the car. After surgery it may be brought to your room.  For patients admitted to the hospital, checkout time is 11:00 AM the day of discharge.   Patients discharged the day of surgery will not be allowed to drive home.  Name and phone number of your driver:   Special Instructions: CHG Shower Use Special Wash: 1/2 bottle night before surgery and 1/2 bottle morning of surgery.   Please read over the following fact sheets that you were given: Pain Booklet, Coughing and Deep Breathing, Blood Transfusion Information, MRSA Information and Surgical Site Infection Prevention

## 2012-01-06 NOTE — Telephone Encounter (Signed)
Talked to pt and gave him appt date for 8/27 lab draw per Darl Pikes, cancelled lab for 8/16 an and 01/29/12

## 2012-01-07 NOTE — Consult Note (Signed)
Anesthesia Chart Review: Patient is a 76 year old female scheduled for right CEA on 01/12/12.  She is s/p left CEA on 11/18/11.   Other history includes hospitalization in 06/2011 for flash pulmonary edema and diagnosed with afib/PAF, mild non-obstructive CAD by 06/2011 cath, and nonischemic CM. She is also treated for DM2, HLD, former smoker, obesity with BMI 38, OSA with CPAP (Dr. Maple Hudson), anemia, anxiety, depression, CKD Stage 4, HTN, OA, gout, DVT '10, CVA '04, right breast CA '10. PCP is Dr. Posey Rea. Oncologist is Dr. Truett Perna.  Her primary Cardiologist is Dr. Peter Swaziland.  She was seen by his partner Dr. Tenny Craw after patient's left CEA for HTN and was seen most recently by Dr. Eden Emms for HTN control before she underwent right CEA.  New anti-hypertensive medications were added.  BP at her PAT appointment was 162/74.  EKG on 11/19/11 showed NSR, LVH, inferior infarct (age undetermined).   Cardiac cath on 07/20/11 showed:  1. Mild nonobstructive atherosclerotic coronary disease (LM 10%, LAD <20%, DIAG 20-30%, LCx <20%, RCA 10-20%).  2. Normal right heart and left ventricular filling pressures.  3. Slight aortic valve gradient.  Recommendations: Recommend continued medical therapy for her cardiomyopathy. (Dr. Swaziland)   Echo on 07/13/11 showed:  - Left ventricle: Systolic function was severely reduced. The estimated ejection fraction was in the range of 25% to 30%. Features are consistent with a pseudonormal left ventricular filling pattern, with concomitant abnormal relaxation and increased filling pressure (grade 2 diastolic dysfunction). Doppler parameters are consistent with elevated mean left atrial filling pressure. - Regional wall motion abnormality: Akinesis of the basal-mid anteroseptal myocardium; hypokinesis of the entire anterior, mid inferolateral, basal-mid anterolateral, and apical lateral myocardium. - Aortic valve: Mild regurgitation. - Mitral valve: Mild regurgitation. - Left  atrium: The atrium was moderately dilated. - Right atrium: The atrium was mildly dilated. - Pulmonary arteries: PA peak pressure: 49mm Hg (S). Impressions: - The right ventricular systolic pressure was increased consistent with mild pulmonary hypertension.  2V CXR on 11/16/11 showed:  1. No radiographic evidence of acute cardiopulmonary disease.  2. Mild hyperexpansion of the lungs is similar to prior  examinations.  3. Mild cardiomegaly.  4. Atherosclerosis.  5. Scarring in the lingula is unchanged.   Labs noted. Cr 1.57, BUN 30 (stable).  Glucose 16109, H/H 10.5/33.3.  PT/PTT mildly elevated. Will repeat on arrival.   She was seen by Cardiology last month, and Dr. Eden Emms was aware of plans for right CEA in the near future.  If no acute CV/CHF symptoms then anticipate she can proceed as planned.   Shonna Chock, PA-C

## 2012-01-08 ENCOUNTER — Other Ambulatory Visit: Payer: Medicare Other | Admitting: Lab

## 2012-01-11 MED ORDER — DEXTROSE 5 % IV SOLN
1.5000 g | INTRAVENOUS | Status: AC
  Administered 2012-01-12: 1.5 g via INTRAVENOUS
  Filled 2012-01-11: qty 1.5

## 2012-01-11 MED ORDER — SODIUM CHLORIDE 0.9 % IV SOLN: INTRAVENOUS | Status: DC

## 2012-01-12 ENCOUNTER — Inpatient Hospital Stay (HOSPITAL_COMMUNITY)
Admission: RE | Admit: 2012-01-12 | Discharge: 2012-01-13 | DRG: 039 | Disposition: A | Discharge status: Home / Self Care | Payer: Medicare Other | Source: Ambulatory Visit | Attending: Vascular Surgery | Admitting: Vascular Surgery

## 2012-01-12 ENCOUNTER — Encounter (HOSPITAL_COMMUNITY): Payer: Self-pay | Admitting: Vascular Surgery

## 2012-01-12 ENCOUNTER — Encounter (HOSPITAL_COMMUNITY)
Admission: RE | Disposition: A | Discharge status: Home / Self Care | Payer: Self-pay | Source: Ambulatory Visit | Attending: Vascular Surgery

## 2012-01-12 ENCOUNTER — Ambulatory Visit (HOSPITAL_COMMUNITY): Payer: Medicare Other | Admitting: Vascular Surgery

## 2012-01-12 DIAGNOSIS — I4891 Unspecified atrial fibrillation: Secondary | ICD-10-CM | POA: Diagnosis present

## 2012-01-12 DIAGNOSIS — Z853 Personal history of malignant neoplasm of breast: Secondary | ICD-10-CM

## 2012-01-12 DIAGNOSIS — Z8673 Personal history of transient ischemic attack (TIA), and cerebral infarction without residual deficits: Secondary | ICD-10-CM

## 2012-01-12 DIAGNOSIS — I252 Old myocardial infarction: Secondary | ICD-10-CM

## 2012-01-12 DIAGNOSIS — E785 Hyperlipidemia, unspecified: Secondary | ICD-10-CM | POA: Diagnosis present

## 2012-01-12 DIAGNOSIS — I10 Essential (primary) hypertension: Secondary | ICD-10-CM | POA: Diagnosis present

## 2012-01-12 DIAGNOSIS — I6529 Occlusion and stenosis of unspecified carotid artery: Secondary | ICD-10-CM

## 2012-01-12 DIAGNOSIS — E119 Type 2 diabetes mellitus without complications: Secondary | ICD-10-CM | POA: Diagnosis present

## 2012-01-12 DIAGNOSIS — E039 Hypothyroidism, unspecified: Secondary | ICD-10-CM | POA: Diagnosis present

## 2012-01-12 DIAGNOSIS — Z87891 Personal history of nicotine dependence: Secondary | ICD-10-CM

## 2012-01-12 DIAGNOSIS — Z7901 Long term (current) use of anticoagulants: Secondary | ICD-10-CM

## 2012-01-12 DIAGNOSIS — I6523 Occlusion and stenosis of bilateral carotid arteries: Secondary | ICD-10-CM

## 2012-01-12 DIAGNOSIS — G4733 Obstructive sleep apnea (adult) (pediatric): Secondary | ICD-10-CM | POA: Diagnosis present

## 2012-01-12 HISTORY — PX: CAROTID ENDARTERECTOMY: SUR193

## 2012-01-12 HISTORY — PX: ENDARTERECTOMY: SHX5162

## 2012-01-12 LAB — GLUCOSE, CAPILLARY: Glucose-Capillary: 111 mg/dL — ABNORMAL HIGH (ref 70–99)

## 2012-01-12 LAB — CBC
Hemoglobin: 9.3 g/dL — ABNORMAL LOW (ref 12.0–15.0)
MCH: 28.4 pg (ref 26.0–34.0)
Platelets: 212 10*3/uL (ref 150–400)
RBC: 3.27 MIL/uL — ABNORMAL LOW (ref 3.87–5.11)

## 2012-01-12 LAB — CREATININE, SERUM
Creatinine, Ser: 1.77 mg/dL — ABNORMAL HIGH (ref 0.50–1.10)
GFR calc non Af Amer: 27 mL/min — ABNORMAL LOW (ref >90)

## 2012-01-12 LAB — APTT: aPTT: 33 seconds (ref 24–37)

## 2012-01-12 LAB — PROTIME-INR
INR: 1.18 (ref 0.00–1.49)
Prothrombin Time: 15.3 seconds — ABNORMAL HIGH (ref 11.6–15.2)

## 2012-01-12 SURGERY — ENDARTERECTOMY, CAROTID
Anesthesia: General | Site: Neck | Laterality: Right | Wound class: Clean

## 2012-01-12 MED ORDER — ALUM & MAG HYDROXIDE-SIMETH 200-200-20 MG/5ML PO SUSP: 15.0000 mL | ORAL | Status: DC | PRN

## 2012-01-12 MED ORDER — ONDANSETRON HCL 4 MG/2ML IJ SOLN
INTRAMUSCULAR | Status: DC | PRN
  Administered 2012-01-12: 4 mg via INTRAVENOUS

## 2012-01-12 MED ORDER — ONDANSETRON HCL 4 MG/2ML IJ SOLN: 4.0000 mg | Freq: Four times a day (QID) | INTRAMUSCULAR | Status: DC | PRN

## 2012-01-12 MED ORDER — BISACODYL 10 MG RE SUPP: 10.0000 mg | Freq: Every day | RECTAL | Status: DC | PRN

## 2012-01-12 MED ORDER — CARVEDILOL 12.5 MG PO TABS: 12.5000 mg | ORAL_TABLET | Freq: Two times a day (BID) | ORAL | Status: DC

## 2012-01-12 MED ORDER — NITROGLYCERIN IN D5W 200-5 MCG/ML-% IV SOLN
INTRAVENOUS | Status: DC | PRN
  Administered 2012-01-12: 16.6 ug/min via INTRAVENOUS

## 2012-01-12 MED ORDER — SODIUM CHLORIDE 0.9 % IV SOLN
INTRAVENOUS | Status: DC
  Administered 2012-01-12: 12:00:00 via INTRAVENOUS

## 2012-01-12 MED ORDER — CARVEDILOL 12.5 MG PO TABS
12.5000 mg | ORAL_TABLET | Freq: Every day | ORAL | Status: DC
  Filled 2012-01-12: qty 1

## 2012-01-12 MED ORDER — CLORAZEPATE DIPOTASSIUM 7.5 MG PO TABS
7.5000 mg | ORAL_TABLET | Freq: Two times a day (BID) | ORAL | Status: DC
  Administered 2012-01-12 – 2012-01-13 (×2): 7.5 mg via ORAL
  Filled 2012-01-12 (×2): qty 2

## 2012-01-12 MED ORDER — POTASSIUM CHLORIDE CRYS ER 20 MEQ PO TBCR: 20.0000 meq | EXTENDED_RELEASE_TABLET | Freq: Once | ORAL | Status: AC | PRN

## 2012-01-12 MED ORDER — PROTAMINE SULFATE 10 MG/ML IV SOLN
INTRAVENOUS | Status: DC | PRN
  Administered 2012-01-12 (×2): 20 mg via INTRAVENOUS
  Administered 2012-01-12: 10 mg via INTRAVENOUS

## 2012-01-12 MED ORDER — MORPHINE SULFATE 2 MG/ML IJ SOLN
2.0000 mg | INTRAMUSCULAR | Status: DC | PRN
  Filled 2012-01-12: qty 1

## 2012-01-12 MED ORDER — PREDNISOLONE ACETATE 1 % OP SUSP
1.0000 [drp] | Freq: Every day | OPHTHALMIC | Status: DC
  Administered 2012-01-13: 1 [drp] via OPHTHALMIC
  Filled 2012-01-12: qty 1

## 2012-01-12 MED ORDER — WARFARIN SODIUM 2 MG PO TABS: 2.0000 mg | ORAL_TABLET | Freq: Every day | ORAL | Status: DC

## 2012-01-12 MED ORDER — SODIUM CHLORIDE 0.9 % IV SOLN: 500.0000 mL | Freq: Once | INTRAVENOUS | Status: AC | PRN

## 2012-01-12 MED ORDER — LACTATED RINGERS IV SOLN
INTRAVENOUS | Status: DC | PRN
  Administered 2012-01-12: 07:00:00 via INTRAVENOUS

## 2012-01-12 MED ORDER — WARFARIN SODIUM 3 MG PO TABS: 3.0000 mg | ORAL_TABLET | ORAL | Status: DC

## 2012-01-12 MED ORDER — LOSARTAN POTASSIUM 50 MG PO TABS
50.0000 mg | ORAL_TABLET | Freq: Every day | ORAL | Status: DC
  Administered 2012-01-12 – 2012-01-13 (×2): 50 mg via ORAL
  Filled 2012-01-12 (×2): qty 1

## 2012-01-12 MED ORDER — OXYCODONE HCL 5 MG PO TABS
5.0000 mg | ORAL_TABLET | ORAL | Status: DC | PRN
  Administered 2012-01-13 (×2): 5 mg via ORAL
  Filled 2012-01-12: qty 2
  Filled 2012-01-12 (×2): qty 1

## 2012-01-12 MED ORDER — ALLOPURINOL 100 MG PO TABS
100.0000 mg | ORAL_TABLET | Freq: Every day | ORAL | Status: DC
  Administered 2012-01-13: 100 mg via ORAL
  Filled 2012-01-12: qty 1

## 2012-01-12 MED ORDER — SODIUM CHLORIDE 0.9 % IR SOLN
Status: DC | PRN
  Administered 2012-01-12: 09:00:00

## 2012-01-12 MED ORDER — BUPROPION HCL ER (SR) 150 MG PO TB12
150.0000 mg | ORAL_TABLET | Freq: Every day | ORAL | Status: DC
  Administered 2012-01-13: 150 mg via ORAL
  Filled 2012-01-12: qty 1

## 2012-01-12 MED ORDER — ASPIRIN EC 325 MG PO TBEC
325.0000 mg | DELAYED_RELEASE_TABLET | Freq: Every day | ORAL | Status: DC
  Administered 2012-01-13: 325 mg via ORAL
  Filled 2012-01-12: qty 1

## 2012-01-12 MED ORDER — PANTOPRAZOLE SODIUM 40 MG PO TBEC
40.0000 mg | DELAYED_RELEASE_TABLET | Freq: Every day | ORAL | Status: DC
  Administered 2012-01-12 – 2012-01-13 (×2): 40 mg via ORAL
  Filled 2012-01-12: qty 1

## 2012-01-12 MED ORDER — DEXTRAN 40 IN SALINE 10-0.9 % IV SOLN
INTRAVENOUS | Status: AC
  Filled 2012-01-12: qty 500

## 2012-01-12 MED ORDER — FENTANYL CITRATE 0.05 MG/ML IJ SOLN
INTRAMUSCULAR | Status: DC | PRN
  Administered 2012-01-12 (×2): 50 ug via INTRAVENOUS
  Administered 2012-01-12: 100 ug via INTRAVENOUS
  Administered 2012-01-12: 50 ug via INTRAVENOUS

## 2012-01-12 MED ORDER — TORSEMIDE 20 MG PO TABS
20.0000 mg | ORAL_TABLET | ORAL | Status: DC
  Administered 2012-01-13: 20 mg via ORAL
  Filled 2012-01-12: qty 1

## 2012-01-12 MED ORDER — METOPROLOL TARTRATE 1 MG/ML IV SOLN: 2.0000 mg | INTRAVENOUS | Status: DC | PRN

## 2012-01-12 MED ORDER — PROPOFOL 10 MG/ML IV EMUL
INTRAVENOUS | Status: DC | PRN
  Administered 2012-01-12: 130 mg via INTRAVENOUS
  Administered 2012-01-12: 30 mg via INTRAVENOUS

## 2012-01-12 MED ORDER — LORAZEPAM 1 MG PO TABS: 1.0000 mg | ORAL_TABLET | Freq: Three times a day (TID) | ORAL | Status: DC | PRN

## 2012-01-12 MED ORDER — THROMBIN 20000 UNITS EX SOLR
CUTANEOUS | Status: DC | PRN
  Administered 2012-01-12: 10:00:00 via TOPICAL

## 2012-01-12 MED ORDER — ENOXAPARIN SODIUM 40 MG/0.4ML ~~LOC~~ SOLN
40.0000 mg | SUBCUTANEOUS | Status: DC
  Filled 2012-01-12 (×2): qty 0.4

## 2012-01-12 MED ORDER — LIDOCAINE HCL (PF) 1 % IJ SOLN
INTRAMUSCULAR | Status: AC
  Filled 2012-01-12: qty 30

## 2012-01-12 MED ORDER — HYDROMORPHONE HCL PF 1 MG/ML IJ SOLN
INTRAMUSCULAR | Status: AC
  Filled 2012-01-12: qty 1

## 2012-01-12 MED ORDER — WARFARIN - PHYSICIAN DOSING INPATIENT: Freq: Every day | Status: DC

## 2012-01-12 MED ORDER — BRIMONIDINE TARTRATE 0.15 % OP SOLN
1.0000 [drp] | Freq: Every day | OPHTHALMIC | Status: DC
  Filled 2012-01-12: qty 5

## 2012-01-12 MED ORDER — ACETAMINOPHEN 650 MG RE SUPP: 325.0000 mg | RECTAL | Status: DC | PRN

## 2012-01-12 MED ORDER — CALCIUM CARBONATE 1250 (500 CA) MG PO TABS
1.0000 | ORAL_TABLET | Freq: Every day | ORAL | Status: DC
  Administered 2012-01-13: 500 mg via ORAL
  Filled 2012-01-12: qty 1

## 2012-01-12 MED ORDER — GLYCOPYRROLATE 0.2 MG/ML IJ SOLN
INTRAMUSCULAR | Status: DC | PRN
  Administered 2012-01-12: 0.2 mg via INTRAVENOUS

## 2012-01-12 MED ORDER — HYDROMORPHONE HCL PF 1 MG/ML IJ SOLN
0.2500 mg | INTRAMUSCULAR | Status: DC | PRN
  Administered 2012-01-12 (×2): 0.5 mg via INTRAVENOUS

## 2012-01-12 MED ORDER — ANASTROZOLE 1 MG PO TABS
1.0000 mg | ORAL_TABLET | Freq: Every day | ORAL | Status: DC
  Administered 2012-01-12 – 2012-01-13 (×2): 1 mg via ORAL
  Filled 2012-01-12 (×2): qty 1

## 2012-01-12 MED ORDER — PHENOL 1.4 % MT LIQD
1.0000 | OROMUCOSAL | Status: DC | PRN
  Filled 2012-01-12: qty 177

## 2012-01-12 MED ORDER — ACETAMINOPHEN 325 MG PO TABS: 325.0000 mg | ORAL_TABLET | ORAL | Status: DC | PRN

## 2012-01-12 MED ORDER — DOCUSATE SODIUM 100 MG PO CAPS
100.0000 mg | ORAL_CAPSULE | Freq: Every day | ORAL | Status: DC
  Administered 2012-01-13: 100 mg via ORAL
  Filled 2012-01-12: qty 1

## 2012-01-12 MED ORDER — DOPAMINE-DEXTROSE 3.2-5 MG/ML-% IV SOLN: 3.0000 ug/kg/min | INTRAVENOUS | Status: DC

## 2012-01-12 MED ORDER — LIDOCAINE HCL 4 % MT SOLN
OROMUCOSAL | Status: DC | PRN
  Administered 2012-01-12: 4 mL via TOPICAL

## 2012-01-12 MED ORDER — CARVEDILOL 25 MG PO TABS
25.0000 mg | ORAL_TABLET | Freq: Two times a day (BID) | ORAL | Status: DC
  Administered 2012-01-13: 25 mg via ORAL
  Filled 2012-01-12 (×4): qty 1

## 2012-01-12 MED ORDER — WARFARIN SODIUM 2 MG PO TABS
2.0000 mg | ORAL_TABLET | ORAL | Status: DC
  Filled 2012-01-12 (×2): qty 1

## 2012-01-12 MED ORDER — PANTOPRAZOLE SODIUM 40 MG PO TBEC: 40.0000 mg | DELAYED_RELEASE_TABLET | Freq: Every day | ORAL | Status: DC

## 2012-01-12 MED ORDER — 0.9 % SODIUM CHLORIDE (POUR BTL) OPTIME
TOPICAL | Status: DC | PRN
  Administered 2012-01-12: 2000 mL

## 2012-01-12 MED ORDER — NITROGLYCERIN IN D5W 200-5 MCG/ML-% IV SOLN: INTRAVENOUS | Status: DC | PRN

## 2012-01-12 MED ORDER — HYDRALAZINE HCL 20 MG/ML IJ SOLN
10.0000 mg | INTRAMUSCULAR | Status: DC | PRN
  Filled 2012-01-12: qty 0.5

## 2012-01-12 MED ORDER — HYDRALAZINE HCL 20 MG/ML IJ SOLN
INTRAMUSCULAR | Status: DC | PRN
  Administered 2012-01-12: 5 mg via INTRAVENOUS

## 2012-01-12 MED ORDER — HEPARIN SODIUM (PORCINE) 1000 UNIT/ML IJ SOLN
INTRAMUSCULAR | Status: DC | PRN
  Administered 2012-01-12: 8000 [IU] via INTRAVENOUS

## 2012-01-12 MED ORDER — PHENYLEPHRINE HCL 10 MG/ML IJ SOLN
10.0000 mg | INTRAVENOUS | Status: DC | PRN
  Administered 2012-01-12: 10 ug/min via INTRAVENOUS

## 2012-01-12 MED ORDER — DEXTROSE 5 % IV SOLN
1.5000 g | Freq: Two times a day (BID) | INTRAVENOUS | Status: AC
  Administered 2012-01-12 – 2012-01-13 (×2): 1.5 g via INTRAVENOUS
  Filled 2012-01-12 (×2): qty 1.5

## 2012-01-12 MED ORDER — LABETALOL HCL 5 MG/ML IV SOLN: 10.0000 mg | INTRAVENOUS | Status: DC | PRN

## 2012-01-12 MED ORDER — GUAIFENESIN-DM 100-10 MG/5ML PO SYRP: 15.0000 mL | ORAL_SOLUTION | ORAL | Status: DC | PRN

## 2012-01-12 MED ORDER — DEXTRAN 40 IN SALINE 10-0.9 % IV SOLN
INTRAVENOUS | Status: DC | PRN
  Administered 2012-01-12: 500 mL

## 2012-01-12 MED ORDER — DOXAZOSIN MESYLATE 2 MG PO TABS
2.0000 mg | ORAL_TABLET | Freq: Every day | ORAL | Status: DC
  Administered 2012-01-12: 2 mg via ORAL
  Filled 2012-01-12 (×2): qty 1

## 2012-01-12 MED ORDER — BRIMONIDINE TARTRATE 0.2 % OP SOLN
1.0000 [drp] | Freq: Every day | OPHTHALMIC | Status: DC
  Administered 2012-01-13: 1 [drp] via OPHTHALMIC
  Filled 2012-01-12: qty 5

## 2012-01-12 MED ORDER — ROCURONIUM BROMIDE 100 MG/10ML IV SOLN
INTRAVENOUS | Status: DC | PRN
  Administered 2012-01-12: 50 mg via INTRAVENOUS

## 2012-01-12 MED ORDER — LIDOCAINE HCL (CARDIAC) 20 MG/ML IV SOLN
INTRAVENOUS | Status: DC | PRN
  Administered 2012-01-12: 60 mg via INTRAVENOUS

## 2012-01-12 MED ORDER — TIMOLOL MALEATE 0.5 % OP SOLN
1.0000 [drp] | Freq: Two times a day (BID) | OPHTHALMIC | Status: DC
  Administered 2012-01-12 – 2012-01-13 (×2): 1 [drp] via OPHTHALMIC
  Filled 2012-01-12: qty 5

## 2012-01-12 MED ORDER — FLUTICASONE-SALMETEROL 100-50 MCG/DOSE IN AEPB
1.0000 | INHALATION_SPRAY | Freq: Two times a day (BID) | RESPIRATORY_TRACT | Status: DC
  Administered 2012-01-13: 1 via RESPIRATORY_TRACT
  Filled 2012-01-12: qty 14

## 2012-01-12 MED ORDER — THROMBIN 20000 UNITS EX SOLR
CUTANEOUS | Status: AC
  Filled 2012-01-12: qty 20000

## 2012-01-12 MED ORDER — MAGNESIUM SULFATE 40 MG/ML IJ SOLN: 2.0000 g | Freq: Once | INTRAMUSCULAR | Status: AC | PRN

## 2012-01-12 MED ORDER — FLEET ENEMA 7-19 GM/118ML RE ENEM
1.0000 | ENEMA | Freq: Once | RECTAL | Status: AC | PRN
  Filled 2012-01-12: qty 1

## 2012-01-12 MED ORDER — CARVEDILOL 25 MG PO TABS
25.0000 mg | ORAL_TABLET | Freq: Every day | ORAL | Status: DC
  Filled 2012-01-12: qty 1

## 2012-01-12 MED ORDER — NITROGLYCERIN IN D5W 200-5 MCG/ML-% IV SOLN: 2.0000 ug/min | INTRAVENOUS | Status: DC

## 2012-01-12 MED ORDER — SENNOSIDES-DOCUSATE SODIUM 8.6-50 MG PO TABS
1.0000 | ORAL_TABLET | Freq: Every evening | ORAL | Status: DC | PRN
  Filled 2012-01-12: qty 1

## 2012-01-12 MED ORDER — LEVOTHYROXINE SODIUM 137 MCG PO TABS
137.0000 ug | ORAL_TABLET | Freq: Every day | ORAL | Status: DC
  Administered 2012-01-13: 137 ug via ORAL
  Filled 2012-01-12: qty 1

## 2012-01-12 SURGICAL SUPPLY — 49 items
BAG DECANTER FOR FLEXI CONT (MISCELLANEOUS) ×2 IMPLANT
CANISTER SUCTION 2500CC (MISCELLANEOUS) ×2 IMPLANT
CATH ROBINSON RED A/P 18FR (CATHETERS) ×2 IMPLANT
CLIP TI MEDIUM 24 (CLIP) ×2 IMPLANT
CLIP TI WIDE RED SMALL 24 (CLIP) ×2 IMPLANT
CLOTH BEACON ORANGE TIMEOUT ST (SAFETY) ×2 IMPLANT
COVER PROBE W GEL 5X96 (DRAPES) ×2 IMPLANT
COVER SURGICAL LIGHT HANDLE (MISCELLANEOUS) ×2 IMPLANT
CRADLE DONUT ADULT HEAD (MISCELLANEOUS) ×2 IMPLANT
DERMABOND ADVANCED (GAUZE/BANDAGES/DRESSINGS) ×1
DERMABOND ADVANCED .7 DNX12 (GAUZE/BANDAGES/DRESSINGS) ×1 IMPLANT
DRAPE WARM FLUID 44X44 (DRAPE) ×2 IMPLANT
ELECT REM PT RETURN 9FT ADLT (ELECTROSURGICAL) ×2
ELECTRODE REM PT RTRN 9FT ADLT (ELECTROSURGICAL) ×1 IMPLANT
GLOVE BIO SURGEON STRL SZ 6.5 (GLOVE) ×6 IMPLANT
GLOVE BIO SURGEON STRL SZ7 (GLOVE) ×4 IMPLANT
GLOVE BIOGEL PI IND STRL 6.5 (GLOVE) ×1 IMPLANT
GLOVE BIOGEL PI IND STRL 7.0 (GLOVE) ×3 IMPLANT
GLOVE BIOGEL PI IND STRL 7.5 (GLOVE) ×1 IMPLANT
GLOVE BIOGEL PI INDICATOR 6.5 (GLOVE) ×1
GLOVE BIOGEL PI INDICATOR 7.0 (GLOVE) ×3
GLOVE BIOGEL PI INDICATOR 7.5 (GLOVE) ×1
GLOVE ECLIPSE 6.5 STRL STRAW (GLOVE) ×2 IMPLANT
GOWN STRL NON-REIN LRG LVL3 (GOWN DISPOSABLE) ×10 IMPLANT
HEMOSTAT SURGICEL 2X14 (HEMOSTASIS) IMPLANT
KIT BASIN OR (CUSTOM PROCEDURE TRAY) ×2 IMPLANT
KIT ROOM TURNOVER OR (KITS) ×2 IMPLANT
NS IRRIG 1000ML POUR BTL (IV SOLUTION) ×4 IMPLANT
PACK CAROTID (CUSTOM PROCEDURE TRAY) ×2 IMPLANT
PAD ARMBOARD 7.5X6 YLW CONV (MISCELLANEOUS) ×4 IMPLANT
PATCH VASCULAR VASCU GUARD 1X6 (Vascular Products) ×2 IMPLANT
SET COLLECT BLD 21X3/4 12 (NEEDLE) ×2 IMPLANT
SHUNT CAROTID BYPASS 10 (VASCULAR PRODUCTS) ×2 IMPLANT
SHUNT CAROTID BYPASS 12FRX15.5 (VASCULAR PRODUCTS) IMPLANT
SPECIMEN JAR SMALL (MISCELLANEOUS) ×2 IMPLANT
SPONGE SURGIFOAM ABS GEL 100 (HEMOSTASIS) ×2 IMPLANT
STOPCOCK 4 WAY LG BORE MALE ST (IV SETS) ×2 IMPLANT
SUT ETHILON 3 0 PS 1 (SUTURE) IMPLANT
SUT MNCRL AB 4-0 PS2 18 (SUTURE) ×2 IMPLANT
SUT PROLENE 6 0 BV (SUTURE) ×8 IMPLANT
SUT PROLENE 7 0 BV 1 (SUTURE) IMPLANT
SUT VIC AB 3-0 SH 27 (SUTURE) ×1
SUT VIC AB 3-0 SH 27X BRD (SUTURE) ×1 IMPLANT
SYR TB 1ML LUER SLIP (SYRINGE) IMPLANT
SYSTEM CHEST DRAIN TLS 7FR (DRAIN) ×2 IMPLANT
TOWEL OR 17X24 6PK STRL BLUE (TOWEL DISPOSABLE) ×2 IMPLANT
TOWEL OR 17X26 10 PK STRL BLUE (TOWEL DISPOSABLE) ×2 IMPLANT
TRAY FOLEY CATH 14FRSI W/METER (CATHETERS) ×2 IMPLANT
WATER STERILE IRR 1000ML POUR (IV SOLUTION) ×2 IMPLANT

## 2012-01-12 NOTE — Progress Notes (Signed)
Pt adm to PACU with NTG drip infusing. Dr Sampson Goon here and aware. Will wean according to BP parameters. And cont to monitor closely.

## 2012-01-12 NOTE — Op Note (Signed)
OPERATIVE NOTE  PROCEDURE:   1.  right carotid endarterectomy with bovine patch angioplasty 2.  right intraoperative carotid ultrasound  PRE-OPERATIVE DIAGNOSIS: right high grade asymptomatic carotid stenosis  POST-OPERATIVE DIAGNOSIS: same as above   SURGEON: Leonides Sake, MD  ASSISTANT(S): Della Goo, [facility]   ANESTHESIA: general  ESTIMATED BLOOD LOSS: 100 cc  FINDING(S): 1.  Continuous Doppler audible flow signatures are appropriate for each carotid artery. 2.  No evidence of intimal flap visualized on transverse or longitudinal ultrasonography. 3.  Calcified carotid plaque with necrotic core.  SPECIMEN(S):  Carotid plaque (sent to Pathology)  INDICATIONS:   Wendy Cole is a 76 y.o. female who presents with right high grade asymptomatic carotid stenosis >80%.  I discussed with the patient the risks, benefits, and alternatives to carotid endarterectomy.  I discussed the procedural details of carotid endarterectomy with the patient.  The patient is aware that the risks of carotid endarterectomy include but are not limited to: bleeding, infection, stroke, myocardial infarction, death, cranial nerve injuries both temporary and permanent, neck hematoma, possible airway compromise, labile blood pressure post-operatively, cerebral hyperperfusion syndrome, and possible need for additional interventions in the future. The patient is aware of the risks and agrees to proceed forward with the procedure.  Additionally, I had previously discussed with this patient the risks of cerebral hyperperfusion in her case given her history of poor blood pressure control.  DESCRIPTION: After full informed written consent was obtained from the patient, the patient was brought back to the operating room and placed supine upon the operating table.  Prior to induction, the patient received IV antibiotics.  After obtaining adequate anesthesia, the patient was placed into semi-Fowler position with a  shoulder roll in place and the patient's neck slightly hyperextended and rotated away from the surgical site.  The patient was prepped in the standard fashion for a right carotid endarterectomy.  I made an incision anterior to the sternocleidomastoid muscle and dissected down through the subcutaneous tissue.  The platysmas was opened with electrocautery.  Then I dissected down to the internal jugular vein.  This was dissected posteriorly until I obtained visualization of the common carotid artery.  This was dissected out and then an umbilical tape was placed around the common carotid artery and I loosely applied a Rumel tourniquet.  I then dissected in a periadventitial fashion along the common carotid artery up to the bifurcation.  I then identified the external carotid artery and the superior thyroid artery.  A 2-0 silk tie was looped around the superior thyroid artery, and I also dissected out the external carotid artery and placed a vessel loop around it.  In continuing the dissection to the internal carotid artery, I identified the facial vein.  This was ligated and then transected, giving me improved exposure of the internal carotid artery.  In the process of this dissection, the hypoglossal nerve was identified.  I then dissected out the internal carotid artery until I identified an area of soft tissue in the internal carotid artery.  I dissected slightly distal to this area, and placed an umbilical tape around the artery and loosely applied a Rumel tourniquet.  At this point, we gave the patient a therapeutic bolus of Heparin intravenously (roughly 80 units/kg).  After waiting 3 minutes, then I clamped the internal carotid artery, external carotid artery and then the common carotid artery.  I then made an arteriotomy in the common carotid artery with a 11 blade, and extended the arteriotomy with a Potts  scissor down into the common carotid artery, then I carried the arteriotomy through the bifurcation into  the internal carotid artery until I reached an area that was not diseased.  At this point, I took the 10 shunt that previously been prepared and I inserted it into the internal carotid artery.  The Rumel tourniquet was then applied to this end of the shunt.  I unclamped the shunt to verify retrograde blood flow in the internal carotid artery.  I then placed the other end of the shunt into the common carotid artery after unclamping the artery.  The Rumel was tightened down around the shunt.  At this point, I verified blood flow in the shunt with a continuous doppler.  At this point, I started the endarterectomy in the common carotid artery with a Cytogeneticist and carried this dissection down into the common carotid artery circumferentially.  Then I transected the plaque at a segment where it was adherent.  I then carried this dissection up into the external carotid artery.  The plaque was extracted by unclamping the external carotid artery and everting the artery.  The dissection was then carried into the internal carotid artery, extracting the remaining portion of the carotid plaque.  I passed the plaque off the field as a specimen.  I then spent the next 30 minutes removing intimal flaps and loose debris.  Eventually I reached the point where the residual plaque was densely adherent and any further dissection would compromise the integrity of the wall.  After verifying that there was no more loose intimal flaps or debris, I re-interrogated the entirety of this carotid artery.  At this point, I was satisfied that the minimal remaining disease was densely adherent to the wall and wall integrity was intact.  At this point, I then fashioned a bovine pericardial patch for the geometry of this artery and sewed it in place with two running stitch of 6-0 Prolene, one from each end.  Prior to completing this patch angioplasty, I removed the shunt first from the internal carotid artery, from which there was excellent  backbleeding, and clamped it.  Then I removed the shunt from the common carotid artery, from which there was excellent antegrade bleeding, and then clamped it.  At this point, I allowed the external carotid artery to backbleed, which was excellent.  Then I instilled heparinized saline in this patched artery and then completed the patch angioplasty in the usual fashion.  First, I released the clamp on the external carotid artery, then I released it on the common carotid artery.  After waiting a few seconds, I then released it on the internal carotid artery.  I then interrogated this patient's arteries with the continuous Doppler.  The audible waveforms in each artery were consistent with the expected characteristics for each artery.  The Sonosite probe was then sterilely draped and used to interrogate the carotid artery in both longitudinal and transverse views.  At this point, I washed out the wound, and placed thrombin and Gelfoam throughout.  I also gave the patient 30 mg of protamine to reverse his anticoagulation.   After waiting a few minutes, I removed the thrombin and Gelfoam and washed out the wound.  There was no more active bleeding in the surgical site.   I then reapproximated the platysma muscle with a running stitch of 3-0 Vicryl.  The skin was then reapproximated with a running subcuticular 4-0 Monocryl stitch.  The skin was then cleaned, dried and Dermabond was used  to reinforce the skin closure.  The patient woke without any problems, neurologically intact.     COMPLICATIONS: none  CONDITION: stable  Leonides Sake, MD [facility] Office: [phone] Pager: [phone]  01/12/2012, 10:28 AM

## 2012-01-12 NOTE — Preoperative (Signed)
Beta Blockers   Reason not to administer Beta Blockers:Not Applicable 

## 2012-01-12 NOTE — H&P (Signed)
[facility] [place]  Brief History and Physical  History of Present Illness  Wendy Cole is a 76 y.o. female who presents with chief complaint: asx R ICA stenosis > 80%.  The patient presents today for R CEA.    Past Medical History  Diagnosis Date  . Anemia   . Anxiety states   . Depressive disorder, not elsewhere classified   . Type II or unspecified type diabetes mellitus without mention of complication, not stated as uncontrolled   . Hyperlipidemia   . Unspecified essential hypertension   . Renal insufficiency   . Osteoarthritis   . Gout   . Iritis   . Nonischemic cardiomyopathy     EF 20 to 25% per echo 06/2011  . Chronic anticoagulation   . NSTEMI (non-ST elevated myocardial infarction) Feb 2013    06/2011 cath  Mild nonobstructive disease. No LV gram  . PAF (paroxysmal atrial fibrillation)   . Obesity   . Cancer 2010    Right breast  . DVT (deep venous thrombosis) 2010  . Stroke 2004    Left upper lobe  . Pulmonary edema 2013  . Thyroid disease 1969    Three forth of Thyroid removed  . Complication of anesthesia     "fights it", for colonoscopy  . Flash pulmonary edema     06/2011  . HOH (hard of hearing)     deaf in L completely  . OSA on CPAP     CPAP at night  . GERD (gastroesophageal reflux disease)   . Hypothyroidism     Past Surgical History  Procedure Date  . Breast lumpectomy   . Cataract extraction   . Foot surgery   . Tonsillectomy 1949  . Abdominal hysterectomy 1974  . Eye surgery 2008    Glaucoma shunt Right eye  . Parotid gland tumor excision 2000  . Cystectomy 1974    Left Breast, chin, left of groin area  . Thyroidectomy, partial 1969  . Endarterectomy 11/18/2011    Procedure: ENDARTERECTOMY CAROTID;  Surgeon: Fransisco Hertz, MD;  Location: [facility] OR;  Service: Vascular;  Laterality: Left;  [name] Mastectomy right  . Mastectomy   . Cardiac catheterization     no stent  . Colonoscopy w/ polypectomy     History     Social History  . Marital Status: Married    Spouse Name: N/A    Number of Children: N/A  . Years of Education: N/A   Occupational History  . Retired    Social History Main Topics  . Smoking status: Former Smoker -- 2.0 packs/day for 40 years    Types: Cigarettes    Quit date: 05/25/2002  . Smokeless tobacco: Never Used  . Alcohol Use: No  . Drug Use: No  . Sexually Active: Not Currently   Other Topics Concern  . Not on file   Social History Narrative  . No narrative on file    Family History  Problem Relation Age of Onset  . Hypertension    . Heart disease Mother 48    CHF  . Cancer Mother     breast  . Heart disease Father 30  . Heart disease Brother     aaa    No current facility-administered medications on file prior to encounter.   Current Outpatient Prescriptions on File Prior to Encounter  Medication Sig Dispense Refill  . BIOTIN PO Take 1 tablet by mouth daily.      [name]  brimonidine (ALPHAGAN) 0.15 % ophthalmic solution Place 1 drop into both eyes daily.       [name] buPROPion (WELLBUTRIN SR) 150 MG 12 hr tablet Take 150 mg by mouth daily.      . calcium carbonate (CALCIUM 500) 1250 MG tablet Take 1 tablet by mouth daily.      [name] doxazosin (CARDURA) 2 MG tablet Take 1 tablet (2 mg total) by mouth at bedtime.  30 tablet  11  . levothyroxine (SYNTHROID, LEVOTHROID) 137 MCG tablet Take 137 mcg by mouth daily.      [name] LORazepam (ATIVAN) 1 MG tablet Take 1 mg by mouth every 8 (eight) hours as needed.      [name] omeprazole (PRILOSEC) 40 MG capsule Take 40 mg by mouth daily.       . prednisoLONE acetate (PRED FORTE) 1 % ophthalmic suspension Place 1 drop into both eyes daily as needed. For eye pain      . timolol (TIMOPTIC) 0.5 % ophthalmic solution Place 1 drop into both eyes 2 (two) times daily. Twice daily      . torsemide (DEMADEX) 20 MG tablet Take 20 mg by mouth every other day.      . vitamin D, CHOLECALCIFEROL, 400 UNITS tablet Take 400 Units by mouth daily.        [name] warfarin (COUMADIN) 1 MG tablet Take 2-3 mg by mouth daily. Take 3mg  on Mondays, and Thursdays and take 2mg  on Tuesday, Wed. Fridays, Sat. And Sun.        Allergies  Allergen Reactions  . Amlodipine     edema  . Atorvastatin Other (See Comments)    Muscle aches  . Colesevelam Other (See Comments)    unknown  . Lasix (Furosemide) Other (See Comments)    Muscle cramps  . Statins Other (See Comments)    Muscle aches  . Tape Rash    Review of Systems: As listed above, otherwise negative.  Physical Examination  Filed Vitals:   01/12/12 0622  BP: 192/64  Pulse: 44  Temp: 98.4 F (36.9 C)  TempSrc: Oral  Resp: 18  SpO2: 94%    General: A&O x 3, WDWN  Pulmonary: Sym exp, good air movt, CTAB, no rales, rhonchi, & wheezing  Cardiac: RRR, Nl S1, S2, no Murmurs, rubs or gallops  Gastrointestinal: soft, NTND, -G/R, - HSM, - masses, - CVAT B  Musculoskeletal: M/S 5/5 throughout , Extremities without ischemic changes   Laboratory See iStat  Medical Decision Making  Wendy Cole is a 76 y.o. female who presents with: asx RICA stenosis > 80%.   The patient is scheduled for: R CEA  I discussed with the patient the risks, benefits, and alternatives to carotid endarterectomy.    I discussed the procedural details of carotid endarterectomy with the patient.  The patient is aware that the risks of carotid endarterectomy include but are not limited to: bleeding, infection, stroke, myocardial infarction, death, cranial nerve injuries both temporary and permanent, neck hematoma, possible airway compromise, labile blood pressure post-operatively, cerebral hyperperfusion syndrome, and possible need for additional interventions in the future.   The patient is aware of the risks and agrees to proceed forward with the procedure.  Leonides Sake, MD [facility] [place] Office: [phone] Pager: [phone]  01/12/2012, 7:28 AM

## 2012-01-12 NOTE — Anesthesia Postprocedure Evaluation (Signed)
  Anesthesia Post-op Note  Patient: Wendy Cole  Procedure(s) Performed: Procedure(s) (LRB): ENDARTERECTOMY CAROTID (Right)  Patient Location: PACU  Anesthesia Type: General  Level of Consciousness: awake  Airway and Oxygen Therapy: Patient Spontanous Breathing and Patient connected to nasal cannula oxygen  Post-op Pain: mild  Post-op Assessment: Post-op Vital signs reviewed, Patient's Cardiovascular Status Stable, Respiratory Function Stable, Patent Airway and No signs of Nausea or vomiting  Post-op Vital Signs: Reviewed and stable  Complications: No apparent anesthesia complications

## 2012-01-12 NOTE — Anesthesia Preprocedure Evaluation (Addendum)
Anesthesia Evaluation  Patient identified by MRN, date of birth, ID band Patient awake    Reviewed: Allergy & Precautions, H&P , NPO status , Patient's Chart, lab work & pertinent test results, reviewed documented beta blocker date and time   Airway Mallampati: II TM Distance: >3 FB Neck ROM: Full    Dental No notable dental hx. (+) Teeth Intact and Dental Advisory Given   Pulmonary shortness of breath, sleep apnea , COPD breath sounds clear to auscultation  Pulmonary exam normal       Cardiovascular hypertension, On Medications and On Home Beta Blockers + Past MI and +CHF + dysrhythmias Atrial Fibrillation Rhythm:Regular Rate:Normal     Neuro/Psych  Headaches, PSYCHIATRIC DISORDERS CVA, Residual Symptoms    GI/Hepatic Neg liver ROS, GERD-  Medicated and Controlled,  Endo/Other    Renal/GU Renal disease  negative genitourinary   Musculoskeletal   Abdominal   Peds  Hematology negative hematology ROS (+)   Anesthesia Other Findings   Reproductive/Obstetrics negative OB ROS                          Anesthesia Physical Anesthesia Plan  ASA: IV  Anesthesia Plan: General   Post-op Pain Management:    Induction: Intravenous  Airway Management Planned: Oral ETT  Additional Equipment: Arterial line  Intra-op Plan:   Post-operative Plan: Extubation in OR  Informed Consent: I have reviewed the patients History and Physical, chart, labs and discussed the procedure including the risks, benefits and alternatives for the proposed anesthesia with the patient or authorized representative who has indicated his/her understanding and acceptance.   Dental advisory given  Plan Discussed with: CRNA  Anesthesia Plan Comments:         Anesthesia Quick Evaluation

## 2012-01-12 NOTE — Progress Notes (Signed)
NTG drip off

## 2012-01-12 NOTE — Anesthesia Procedure Notes (Signed)
Procedure Name: Intubation Date/Time: 01/12/2012 7:59 AM Performed by: Jerilee Hoh Pre-anesthesia Checklist: Patient identified, Emergency Drugs available, Suction available and Patient being monitored Patient Re-evaluated:Patient Re-evaluated prior to inductionOxygen Delivery Method: Circle system utilized Preoxygenation: Pre-oxygenation with 100% oxygen Intubation Type: IV induction Ventilation: Mask ventilation without difficulty and Oral airway inserted - appropriate to patient size Laryngoscope Size: Mac and 3 Grade View: Grade II Tube type: Oral Tube size: 7.5 mm Number of attempts: 1 Airway Equipment and Method: Stylet and LTA kit utilized Placement Confirmation: ETT inserted through vocal cords under direct vision,  positive ETCO2 and breath sounds checked- equal and bilateral Secured at: 21 cm Tube secured with: Tape Dental Injury: Teeth and Oropharynx as per pre-operative assessment

## 2012-01-12 NOTE — Transfer of Care (Signed)
Immediate Anesthesia Transfer of Care Note  Patient: Wendy Cole  Procedure(s) Performed: Procedure(s) (LRB): ENDARTERECTOMY CAROTID (Right)  Patient Location: PACU  Anesthesia Type: General  Level of Consciousness: awake, alert , oriented and patient cooperative  Airway & Oxygen Therapy: Patient Spontanous Breathing and Patient connected to face mask oxygen  Post-op Assessment: Report given to PACU RN, Post -op Vital signs reviewed and stable and Patient moving all extremities  Post vital signs: Reviewed and stable  Complications: No apparent anesthesia complications

## 2012-01-13 ENCOUNTER — Encounter (HOSPITAL_COMMUNITY): Payer: Self-pay | Admitting: *Deleted

## 2012-01-13 ENCOUNTER — Telehealth: Payer: Self-pay | Admitting: Vascular Surgery

## 2012-01-13 ENCOUNTER — Telehealth: Payer: Self-pay

## 2012-01-13 DIAGNOSIS — G8918 Other acute postprocedural pain: Secondary | ICD-10-CM

## 2012-01-13 LAB — CBC
MCH: 27.8 pg (ref 26.0–34.0)
MCHC: 31.6 g/dL (ref 30.0–36.0)
MCV: 87.9 fL (ref 78.0–100.0)
Platelets: 189 10*3/uL (ref 150–400)
RDW: 14.9 % (ref 11.5–15.5)

## 2012-01-13 LAB — BASIC METABOLIC PANEL
BUN: 21 mg/dL (ref 6–23)
Calcium: 8.1 mg/dL — ABNORMAL LOW (ref 8.4–10.5)
Creatinine, Ser: 1.87 mg/dL — ABNORMAL HIGH (ref 0.50–1.10)
GFR calc Af Amer: 30 mL/min — ABNORMAL LOW (ref >90)

## 2012-01-13 LAB — PROTIME-INR: Prothrombin Time: 16 seconds — ABNORMAL HIGH (ref 11.6–15.2)

## 2012-01-13 MED ORDER — OXYCODONE HCL 5 MG PO TABS: 5.0000 mg | ORAL_TABLET | ORAL | Status: AC | PRN

## 2012-01-13 MED ORDER — CARVEDILOL 12.5 MG PO TABS: 12.5000 mg | ORAL_TABLET | Freq: Two times a day (BID) | ORAL | Status: DC

## 2012-01-13 MED ORDER — HYDROCODONE-ACETAMINOPHEN 5-500 MG PO TABS: 1.0000 | ORAL_TABLET | Freq: Four times a day (QID) | ORAL | Status: AC | PRN

## 2012-01-13 NOTE — Telephone Encounter (Signed)
LVM with pt and sent appt letter, dpm

## 2012-01-13 NOTE — Discharge Summary (Signed)
[facility] Discharge Summary   Patient ID:  Wendy Cole MRN: [id number] DOB/AGE: 01/06/1936 76 y.o.  Admit date: 01/12/2012 Discharge date: 01/13/2012 Date of Surgery: 01/12/2012 Surgeon: Surgeon(s): Fransisco Hertz, MD  Admission Diagnosis: right internal carotid artery stenosis  Discharge Diagnoses:  right internal carotid artery stenosis  Secondary Diagnoses: Past Medical History  Diagnosis Date  . Anemia   . Anxiety states   . Depressive disorder, not elsewhere classified   . Type II or unspecified type diabetes mellitus without mention of complication, not stated as uncontrolled   . Hyperlipidemia   . Unspecified essential hypertension   . Renal insufficiency   . Osteoarthritis   . Gout   . Iritis   . Nonischemic cardiomyopathy     EF 20 to 25% per echo 06/2011  . Chronic anticoagulation   . NSTEMI (non-ST elevated myocardial infarction) Feb 2013    06/2011 cath  Mild nonobstructive disease. No LV gram  . PAF (paroxysmal atrial fibrillation)   . Obesity   . Cancer 2010    Right breast  . DVT (deep venous thrombosis) 2010  . Stroke 2004    Left upper lobe  . Pulmonary edema 2013  . Thyroid disease 1969    Three forth of Thyroid removed  . Complication of anesthesia     "fights it", for colonoscopy  . Flash pulmonary edema     06/2011  . HOH (hard of hearing)     deaf in L completely  . OSA on CPAP     CPAP at night  . GERD (gastroesophageal reflux disease)   . Hypothyroidism     Procedure(s): ENDARTERECTOMY CAROTID  Discharged Condition: good  HPI:  Wendy Cole is a 76 y.o. female who presents with chief complaint: asx R ICA stenosis > 80%. The patient presents today for R CEA. Pt had L CEA (Date: 11/19/11) and had done well. She denies any signs or symptoms of stroke  Hospital Course:  Wendy Cole is a 76 y.o. female is S/P Right Procedure(s): ENDARTERECTOMY CAROTID Extubated: POD # 0 Post-op wounds healing  well Pt. Ambulating, voiding and taking PO diet without difficulty. Pt pain controlled with PO pain meds. Labs as below Complications:none Neuro exam intact     Significant Diagnostic Studies: CBC Lab Results  Component Value Date   WBC 6.9 01/13/2012   HGB 8.5* 01/13/2012   HCT 26.9* 01/13/2012   MCV 87.9 01/13/2012   PLT 189 01/13/2012    BMET    Component Value Date/Time   NA 139 01/13/2012 0415   K 4.6 01/13/2012 0415   CL 107 01/13/2012 0415   CO2 24 01/13/2012 0415   GLUCOSE 102* 01/13/2012 0415   GLUCOSE 81 04/09/2006 1716   BUN 21 01/13/2012 0415   CREATININE 1.87* 01/13/2012 0415   CALCIUM 8.1* 01/13/2012 0415   CALCIUM 9.6 09/27/2006 2054   GFRNONAA 26* 01/13/2012 0415   GFRAA 30* 01/13/2012 0415   COAG Lab Results  Component Value Date   INR 1.25 01/13/2012   INR 1.18 01/12/2012   INR 1.70* 01/06/2012   PROTIME 15.6* 01/01/2012   PROTIME 21.6* 12/04/2011   PROTIME 21.6* 11/06/2011     Disposition:  Discharge to :Home Discharge Orders    Future Appointments: Provider: Department: Dept Phone: Center:   01/19/2012 1:30 PM Beverely Pace [facility] [phone] None   01/29/2012 1:45 PM Fransisco Hertz, MD [facility] [phone] [facility]   02/25/2012 2:00 PM Noralyn Pick  Eden Emms, MD [facility] [phone] [facility]   02/26/2012 12:00 PM Krista Blue [facility] [phone] None   03/08/2012 12:00 PM Ladene Artist, MD [facility] [phone] None   03/23/2012 2:15 PM Tresa Garter, MD [facility] [phone] [facility]   04/26/2012 1:30 PM Waymon Budge, MD [facility] [phone] None     Future Orders Please Complete By Expires   Resume previous diet      Driving Restrictions      Comments:   No driving for 2 weeks   Lifting restrictions      Comments:   No lifting for 4 weeks   Call MD for:  temperature >100.5      Call MD for:  redness, tenderness, or signs of infection (pain, swelling, bleeding, redness, odor or  green/yellow discharge around incision site)      Call MD for:  severe or increased pain, loss or decreased feeling  in affected limb(s)      Increase activity slowly      Comments:   Walk with assistance use walker or cane as needed   May shower       Scheduling Instructions:   Thursday   CAROTID Sugery: Call MD for difficulty swallowing or speaking; weakness in arms or legs that is a new symtom; severe headache.  If you have increased swelling in the neck and/or  are having difficulty breathing, CALL 911         [place], [place]  Home Medication Instructions [id number]   Printed on:01/13/12 1510  Medication Information                    prednisoLONE acetate (PRED FORTE) 1 % ophthalmic suspension Place 1 drop into both eyes daily as needed. For eye pain           timolol (TIMOPTIC) 0.5 % ophthalmic solution Place 1 drop into both eyes 2 (two) times daily. Twice daily           omeprazole (PRILOSEC) 40 MG capsule Take 40 mg by mouth daily.            vitamin D, CHOLECALCIFEROL, 400 UNITS tablet Take 400 Units by mouth daily.            brimonidine (ALPHAGAN) 0.15 % ophthalmic solution Place 1 drop into both eyes daily.            buPROPion (WELLBUTRIN SR) 150 MG 12 hr tablet Take 150 mg by mouth daily.           levothyroxine (SYNTHROID, LEVOTHROID) 137 MCG tablet Take 137 mcg by mouth daily.           calcium carbonate (CALCIUM 500) 1250 MG tablet Take 1 tablet by mouth daily.           BIOTIN PO Take 1 tablet by mouth daily.           LORazepam (ATIVAN) 1 MG tablet Take 1 mg by mouth every 8 (eight) hours as needed.           Fluticasone-Salmeterol (ADVAIR DISKUS) 100-50 MCG/DOSE AEPB Inhale 1 puff into the lungs every 12 (twelve) hours as needed. For shortness of breath           torsemide (DEMADEX) 20 MG tablet Take 20 mg by mouth every other day.           allopurinol (ZYLOPRIM) 100 MG tablet Take 100 mg by mouth daily.  anastrozole  (ARIMIDEX) 1 MG tablet Take 1 mg by mouth daily.           clorazepate (TRANXENE) 7.5 MG tablet Take 7.5 mg by mouth 2 (two) times daily as needed. For anxiety           losartan (COZAAR) 50 MG tablet Take 50 mg by mouth daily.           febuxostat (ULORIC) 40 MG tablet Take 80 mg by mouth daily.           doxazosin (CARDURA) 2 MG tablet Take 1 tablet (2 mg total) by mouth at bedtime.           warfarin (COUMADIN) 1 MG tablet Take 2-3 mg by mouth as directed. Take 3 mg on Mondays, Wednesdays, and Fridays. Take 2 mg on Sunday, Tuesdays, Thursdays, and Saturdays.           oxyCODONE (OXY IR/ROXICODONE) 5 MG immediate release tablet Take 1-2 tablets (5-10 mg total) by mouth every 4 (four) hours as needed.           carvedilol (COREG) 12.5 MG tablet Take 1-2 tablets (12.5-25 mg total) by mouth 2 (two) times daily with a meal. Taking 2 tablets in the morning and 1 at night            Verbal and written Discharge instructions given to the patient. Wound care per Discharge AVS Follow-up Information    Follow up with Nilda Simmer, MD in 2 weeks. (office will arrange -sent)    Contact information:   [address] [place] [address] [phone]          Signed: Marlowe Shores 01/13/2012, 3:10 PM    Addendum  I have independently interviewed and examined the patient, and I agree with the physician assistant's discharge summary.  This patient had an unremarkable R CEA except for high bifurcation.  She has been neurologically intact, with stable blood pressures, and no evidence of cerebral hyperperfusion syndrome.  She will follow up in the office in two weeks.  Leonides Sake, MD [facility] [place] Office: [phone] Pager: [phone]  01/13/2012, 3:23 PM

## 2012-01-13 NOTE — Progress Notes (Addendum)
VASCULAR AND VEIN SURGERY POST - OP CEA PROGRESS NOTE  Date of Surgery: 01/12/2012 Surgeon: Surgeon(s): Fransisco Hertz, MD 1 Day Post-Op right Carotid Endarterectomy .  HPI: Wendy Cole is a 76 y.o. female who is 1 Day Post-Op right Carotid Endarterectomy . Patient is doing well. Patient denies headache; Patient denies difficulty swallowing; denies weakness in upper or lower extremities; Pt. denies other symptoms of stroke or TIA.  IMAGING: No results found.  Significant Diagnostic Studies: CBC Lab Results  Component Value Date   WBC 6.9 01/13/2012   HGB 8.5* 01/13/2012   HCT 26.9* 01/13/2012   MCV 87.9 01/13/2012   PLT 189 01/13/2012    BMET    Component Value Date/Time   NA 139 01/13/2012 0415   K 4.6 01/13/2012 0415   CL 107 01/13/2012 0415   CO2 24 01/13/2012 0415   GLUCOSE 102* 01/13/2012 0415   GLUCOSE 81 04/09/2006 1716   BUN 21 01/13/2012 0415   CREATININE 1.87* 01/13/2012 0415   CALCIUM 8.1* 01/13/2012 0415   CALCIUM 9.6 09/27/2006 2054   GFRNONAA 26* 01/13/2012 0415   GFRAA 30* 01/13/2012 0415    COAG Lab Results  Component Value Date   INR 1.25 01/13/2012   INR 1.18 01/12/2012   INR 1.70* 01/06/2012   PROTIME 15.6* 01/01/2012   PROTIME 21.6* 12/04/2011   PROTIME 21.6* 11/06/2011   No results found for this basename: PTT      Intake/Output Summary (Last 24 hours) at 01/13/12 0723 Last data filed at 01/13/12 0600  Gross per 24 hour  Intake 1516.25 ml  Output   1210 ml  Net 306.25 ml    Physical Exam:  BP Readings from Last 3 Encounters:  01/13/12 129/33  01/13/12 129/33  01/06/12 162/74   Temp Readings from Last 3 Encounters:  01/13/12 98.4 F (36.9 C) Oral  01/13/12 98.4 F (36.9 C) Oral  01/06/12 97.6 F (36.4 C)    SpO2 Readings from Last 3 Encounters:  01/13/12 92%  01/13/12 92%  01/06/12 95%   Pulse Readings from Last 3 Encounters:  01/13/12 51  01/13/12 51  01/06/12 58    Pt is A&O x 3 Gait is normal Speech is fluent right  Neck Wound is healing well Patient with Positive tongue deviation and Negative facial droop Pt has good and equal strength in all extremities  Assessment: NOBUKO GSELL is a 76 y.o. female is S/P Right Carotid endarterectomy Pt is taking po well BP well controlled- no need for anti hypertensive drips overnight Neuro intact   Plan: Discharge to: Home after ambulates and voids Dc foley and a-line Follow-up in 2 weeks   ROCZNIAK,REGINA J [phone] 01/13/2012 7:23 AM  Addendum  I have independently interviewed and examined the patient, and I agree with the physician assistant's findings.  No obvious neurologic loss.  BP stable overnight.  Wean off oxygen, d/c a-line and foley.  Likely D/C home later today.  Leonides Sake, MD [facility] [place] Office: [phone] Pager: [phone]  01/13/2012, 8:14 AM

## 2012-01-13 NOTE — Telephone Encounter (Signed)
Message copied by Fredrich Birks on Wed Jan 13, 2012 10:09 AM ------      Message from: Melene Plan      Created: Tue Jan 12, 2012  5:08 PM                   ----- Message -----         From: Marlowe Shores, [place]         Sent: 01/12/2012   4:32 PM           To: Melene Plan, RN            2 week F/U right CEA - chen

## 2012-01-13 NOTE — Progress Notes (Signed)
Discharged to home with husband, discharge instructions reviewed, belongings sent with pt

## 2012-01-13 NOTE — Telephone Encounter (Signed)
Phone call from pt.  States she was discharged from hospital today, and has pain and swelling in right neck.  (S/p right CEA of 01/12/12)    Denies difficulty swallowing or breathing.  Advised pt. Some swelling is a normal occurrence after carotid surgery.  Questioned if she has taken any pain medication?  Stated she didn't receive any pain medication prescription when discharged.  Called R. Roczniak , PA. to determine if RX was given upon D/C.    Stated the pain RX was placed on chart, and the nurses may have forgotten to give it to the pt.  Advised will call in the standing order RX for Vicodin.  Phone call to pt. And informed that nurse will call in Vicodin to her pharmacy.  Pt. Agrees w/ plan.

## 2012-01-13 NOTE — Care Management Note (Signed)
    Page 1 of 1   01/13/2012     1:17:37 PM   CARE MANAGEMENT NOTE 01/13/2012  Patient:  Wendy Cole, Wendy Cole   Account Number:  [id number]  Date Initiated:  01/13/2012  Documentation initiated by:  Afnan Cadiente  Subjective/Objective Assessment:   PT S/P RT CEA ON 01/12/12.  PTA, PT INDEPENDENT, LIVES WITH SUPPORTIVE HUSBAND.     Action/Plan:   MET WITH PT TO DISCUSS DC PLANS.  SPOUSE TO PROVIDE CARE AT DISCHARGE.  WILL FOLLOW FOR HOME NEEDS AS PT PROGRESSES.   Anticipated DC Date:  01/13/2012   Anticipated DC Plan:  HOME/SELF CARE      DC Planning Services  CM consult      Choice offered to / List presented to:             Status of service:  Completed, signed off Medicare Important Message given?   (If response is "NO", the following Medicare IM given date fields will be blank) Date Medicare IM given:   Date Additional Medicare IM given:    Discharge Disposition:  HOME/SELF CARE  Per UR Regulation:    If discussed at Long Length of Stay Meetings, dates discussed:    Comments:

## 2012-01-15 LAB — TYPE AND SCREEN
Antibody Screen: POSITIVE
Donor AG Type: NEGATIVE
Donor AG Type: NEGATIVE

## 2012-01-19 ENCOUNTER — Telehealth: Payer: Self-pay | Admitting: Pulmonary Disease

## 2012-01-19 ENCOUNTER — Other Ambulatory Visit (HOSPITAL_BASED_OUTPATIENT_CLINIC_OR_DEPARTMENT_OTHER): Payer: Medicare Other | Admitting: Lab

## 2012-01-19 ENCOUNTER — Telehealth: Payer: Self-pay | Admitting: *Deleted

## 2012-01-19 DIAGNOSIS — I82409 Acute embolism and thrombosis of unspecified deep veins of unspecified lower extremity: Secondary | ICD-10-CM

## 2012-01-19 LAB — PROTIME-INR: Protime: 20.4 Seconds — ABNORMAL HIGH (ref 10.6–13.4)

## 2012-01-19 NOTE — Telephone Encounter (Signed)
This is not a medicine I ordinarily prescribe. I might have filled it once as a courtesy, but I don't see when. Who usually fills this for her?

## 2012-01-19 NOTE — Telephone Encounter (Signed)
PT INR results reviewed by Dr. Truett Perna, continue same dose. Recheck INR in two weeks. Spoke with pt's husband, instructions given. Schedulers to call with lab appt, husband voiced understanding.

## 2012-01-19 NOTE — Telephone Encounter (Signed)
[facility]   Bupropion sr 150 mg <> take 1 tablet by mouth qd . # 30 x5 Last fill was 10-05-2011 Pt has appt 04-26-12 Allergies  Allergen Reactions  . Amlodipine     edema  . Atorvastatin Other (See Comments)    Muscle aches  . Colesevelam Other (See Comments)    unknown  . Lasix (Furosemide) Other (See Comments)    Muscle cramps  . Statins Other (See Comments)    Muscle aches  . Tape Rash   Dr Maple Hudson  Please advise thank you

## 2012-01-20 ENCOUNTER — Other Ambulatory Visit: Payer: Self-pay | Admitting: Internal Medicine

## 2012-01-20 NOTE — Telephone Encounter (Signed)
Spoke with pharmacy-will send to original Rxing MD.

## 2012-01-22 ENCOUNTER — Telehealth: Payer: Self-pay | Admitting: Oncology

## 2012-01-22 NOTE — Telephone Encounter (Signed)
S/w the pt and she is aware of her lab appt in sept

## 2012-01-27 ENCOUNTER — Other Ambulatory Visit: Payer: Self-pay | Admitting: *Deleted

## 2012-01-28 ENCOUNTER — Telehealth: Payer: Self-pay | Admitting: Internal Medicine

## 2012-01-28 ENCOUNTER — Telehealth: Payer: Self-pay | Admitting: Oncology

## 2012-01-28 ENCOUNTER — Encounter: Payer: Self-pay | Admitting: Vascular Surgery

## 2012-01-28 NOTE — Telephone Encounter (Signed)
S/w the pt's husband and he is aware of the oct appt

## 2012-01-28 NOTE — Telephone Encounter (Signed)
I spoke with the pt spouse and he states over the last week the pt has been having increased SOB that is worse in the evenings. He states she has an rx for advair but she is not taking this on a daily basis on ly as needed. He states when she get SOB she will take the advair and she feels better. I advised that it is best they come in for an OV to discuss symptoms. Pt set for ov tomorrow at 4:15pm.Jennifer Glacier, CMA

## 2012-01-29 ENCOUNTER — Ambulatory Visit (INDEPENDENT_AMBULATORY_CARE_PROVIDER_SITE_OTHER): Payer: Medicare Other | Admitting: Vascular Surgery

## 2012-01-29 ENCOUNTER — Other Ambulatory Visit: Payer: Medicare Other | Admitting: Lab

## 2012-01-29 ENCOUNTER — Encounter: Payer: Self-pay | Admitting: Vascular Surgery

## 2012-01-29 ENCOUNTER — Ambulatory Visit (INDEPENDENT_AMBULATORY_CARE_PROVIDER_SITE_OTHER): Payer: Medicare Other | Admitting: Internal Medicine

## 2012-01-29 ENCOUNTER — Encounter: Payer: Self-pay | Admitting: Internal Medicine

## 2012-01-29 VITALS — BP 162/56 | HR 61 | Resp 20 | Ht 64.0 in | Wt 220.0 lb

## 2012-01-29 VITALS — BP 124/80 | HR 65 | Ht 64.0 in | Wt 222.4 lb

## 2012-01-29 DIAGNOSIS — I6529 Occlusion and stenosis of unspecified carotid artery: Secondary | ICD-10-CM

## 2012-01-29 DIAGNOSIS — G4733 Obstructive sleep apnea (adult) (pediatric): Secondary | ICD-10-CM

## 2012-01-29 DIAGNOSIS — I82509 Chronic embolism and thrombosis of unspecified deep veins of unspecified lower extremity: Secondary | ICD-10-CM

## 2012-01-29 DIAGNOSIS — Z48812 Encounter for surgical aftercare following surgery on the circulatory system: Secondary | ICD-10-CM | POA: Insufficient documentation

## 2012-01-29 DIAGNOSIS — J449 Chronic obstructive pulmonary disease, unspecified: Secondary | ICD-10-CM

## 2012-01-29 DIAGNOSIS — Z9989 Dependence on other enabling machines and devices: Secondary | ICD-10-CM

## 2012-01-29 NOTE — Patient Instructions (Addendum)
Use your Advair as a maintenance controller- every day, 1 puff then rinse mouth well, twice daily   Sample Xopenex rescue inhaler   2 puffs  Every 8 hours if needed for quick effect      These

## 2012-01-29 NOTE — Progress Notes (Signed)
Subjective:    [name], female    DOB: 19-Nov-1935, 76 y.o.   MRN: [id number]  HPI 10/17/10- 27 yoF former smoker followed for OSA and COPD, complicated by DM, hx breast cancer, hx DVT. Here with husband. Last here Oct 17, 2009- note reviewed.  She denies any significant breathing issues since last here. Continues to use CPAP at 10 all night every night. Never recurrence of DVT left leg.  Noted minor cough when first lying down- they are not concerned.   10/22/11- 54 yoF former smoker followed for OSA and COPD, complicated by DM, hx breast cancer, hx DVT.... Here with husband. Denies any SOB, wheezing, cough, or congestion. Wears CPAP every night for 5-6 hours approx. Stable dyspnea on exertion with hills and stairs. Hospitalized from February 18 through 26 with pulmonary edema, history AFib, chronic Coumadin, history of DVT. Ejection fraction 25-30%.  01/29/12- 81 yoF former smoker followed for OSA and COPD, complicated by DM, hx breast cancer, hx DVT.... Here with husband. ACUTE VISIT: Increased SOB and worse at night; get raspy breathing prior to SOB starting up. This episode made her feel like her stomach was going to pop. Hospital 8/20 through 01/13/2012-right carotid endarterectomy/Dr. Imogene Burn. Occasional episodes all summer described as onset of shortness of breath in the evening after dinner when she is reading or watching TV or it she starts clearing her throat and feels bloated in the abdomen which makes her more short of breath. Worse if lying down. Denies fever, purulent discharge, palpitation or chest pain. 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 CPAP all night every night. She has been miss- using Advair, saving it for her episodes of dyspnea, so she uses it once at night with 2 or 3 activations. I reeducated her on use as a maintenance inhaler.  ROS-see HPI Constitutional:   No-   weight loss, night  sweats, fevers, chills, fatigue, lassitude. HEENT:   No-  headaches, difficulty swallowing, tooth/dental problems, sore throat,       No-  sneezing, itching, ear ache, nasal congestion, post nasal drip,  CV:  No-   chest pain, orthopnea, PND, swelling in lower extremities, anasarca, dizziness, palpitations Resp: +shortness of breath with exertion or at rest.              No-   productive cough,  No non-productive cough,  No- coughing up of blood.              No-   change in color of mucus.  No- wheezing.   Skin: No-   rash or lesions. GI:  No-   heartburn, indigestion, abdominal pain, nausea, vomiting,  GU:  MS:  No-   joint pain or swelling.   Neuro-     nothing unusual Psych:  No- change in mood or affect. No depression or anxiety.  No memory loss.  OBJ- Physical Exam General- Alert, Oriented, Affect-appropriate, Distress- none acute Skin- rash-none, lesions- none, excoriation- none Lymphadenopathy- none Head- atraumatic            Eyes- Gross vision intact, PERRLA, conjunctivae and secretions clear            Ears- Hearing, canals-normal            Nose- Clear, no-Septal dev, mucus, polyps, erosion, perforation             Throat- Mallampati II , mucosa clear , drainage- none,  tonsils- atrophic Neck- flexible , trachea midline, no stridor , thyroid nl, + R carotid scar Chest - symmetrical excursion , unlabored           Heart/CV- RRR , no murmur , no gallop  , no rub, nl s1 s2                           - JVD- none , edema- none, stasis changes- none, varices- none           Lung- clear to P&A, wheeze- none, cough- none , dullness-none, rub- none           Chest wall-  Abd-  Br/ Gen/ Rectal- Not done, not indicated Extrem- cyanosis- none, clubbing, none, atrophy- none, strength- nl Neuro- grossly intact to observation

## 2012-01-29 NOTE — Progress Notes (Signed)
[facility] [place]  Postoperative Visit  History of Present Illness  Wendy Cole is a 76 y.o. female who presents for postoperative follow-up for: R CEA (Date: 01/12/12).  The patient's neck incision is healed.  The patient has had no stroke or TIA symptoms.  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  Physical Examination  Filed Vitals:   01/29/12 1342  BP: 162/56  Pulse: 61  Resp: 20    R Neck: Incision is healed Neuro: CN 2-12 are intact , Motor strength is 5/5  bilaterally, sensation is grossly intact  Medical Decision Making  Wendy Cole is a 77 y.o. female who presents s/p R CEA.  The patient's neck incision is healing with no stroke symptoms. I discussed in depth with the patient the nature of atherosclerosis, and emphasized the importance of maximal medical management including strict control of blood pressure, blood glucose, and lipid levels, obtaining regular exercise, and cessation of smoking.  The patient is aware that without maximal medical management the underlying atherosclerotic disease process will progress, limiting the benefit of any interventions. The patient's surveillance will included routine carotid duplex studies which will be completed in: 3 months, at which time the patient will be re-evaluated.   I am also ordering a LLE venous insufficiency study to evaluate the LLE venous structures to see if residual venous insufficiency is present after her LLE DVT. I emphasized the importance of routine surveillance of the carotid arteries as recurrence of stenosis is possible, especially with proper management of underlying atherosclerotic disease. The patient agrees to participate in their maximal medical care and routine surveillance.  Thank you for allowing [place] to participate in this patient's care.  Leonides Sake, MD [facility]  [place] Office: [phone] Pager: [phone]

## 2012-02-02 ENCOUNTER — Telehealth: Payer: Self-pay | Admitting: Internal Medicine

## 2012-02-02 ENCOUNTER — Other Ambulatory Visit: Payer: Medicare Other | Admitting: Lab

## 2012-02-02 ENCOUNTER — Inpatient Hospital Stay (HOSPITAL_COMMUNITY)
Admission: EM | Admit: 2012-02-02 | Discharge: 2012-02-05 | DRG: 689 | Disposition: A | Discharge status: Home / Self Care | Payer: Medicare Other | Attending: Internal Medicine | Admitting: Internal Medicine

## 2012-02-02 ENCOUNTER — Inpatient Hospital Stay (HOSPITAL_COMMUNITY): Payer: Medicare Other

## 2012-02-02 ENCOUNTER — Emergency Department (HOSPITAL_COMMUNITY): Payer: Medicare Other

## 2012-02-02 ENCOUNTER — Telehealth: Payer: Self-pay | Admitting: *Deleted

## 2012-02-02 ENCOUNTER — Encounter (HOSPITAL_COMMUNITY): Payer: Self-pay

## 2012-02-02 DIAGNOSIS — E119 Type 2 diabetes mellitus without complications: Secondary | ICD-10-CM | POA: Diagnosis present

## 2012-02-02 DIAGNOSIS — E039 Hypothyroidism, unspecified: Secondary | ICD-10-CM | POA: Diagnosis present

## 2012-02-02 DIAGNOSIS — J449 Chronic obstructive pulmonary disease, unspecified: Secondary | ICD-10-CM

## 2012-02-02 DIAGNOSIS — M353 Polymyalgia rheumatica: Secondary | ICD-10-CM | POA: Diagnosis present

## 2012-02-02 DIAGNOSIS — J9600 Acute respiratory failure, unspecified whether with hypoxia or hypercapnia: Secondary | ICD-10-CM | POA: Diagnosis present

## 2012-02-02 DIAGNOSIS — I5043 Acute on chronic combined systolic (congestive) and diastolic (congestive) heart failure: Secondary | ICD-10-CM | POA: Diagnosis present

## 2012-02-02 DIAGNOSIS — I82409 Acute embolism and thrombosis of unspecified deep veins of unspecified lower extremity: Secondary | ICD-10-CM | POA: Insufficient documentation

## 2012-02-02 DIAGNOSIS — E079 Disorder of thyroid, unspecified: Secondary | ICD-10-CM | POA: Insufficient documentation

## 2012-02-02 DIAGNOSIS — A498 Other bacterial infections of unspecified site: Secondary | ICD-10-CM | POA: Diagnosis present

## 2012-02-02 DIAGNOSIS — N184 Chronic kidney disease, stage 4 (severe): Secondary | ICD-10-CM

## 2012-02-02 DIAGNOSIS — E876 Hypokalemia: Secondary | ICD-10-CM | POA: Diagnosis present

## 2012-02-02 DIAGNOSIS — H9190 Unspecified hearing loss, unspecified ear: Secondary | ICD-10-CM | POA: Insufficient documentation

## 2012-02-02 DIAGNOSIS — E669 Obesity, unspecified: Secondary | ICD-10-CM | POA: Insufficient documentation

## 2012-02-02 DIAGNOSIS — E785 Hyperlipidemia, unspecified: Secondary | ICD-10-CM | POA: Diagnosis present

## 2012-02-02 DIAGNOSIS — E890 Postprocedural hypothyroidism: Secondary | ICD-10-CM | POA: Diagnosis present

## 2012-02-02 DIAGNOSIS — I509 Heart failure, unspecified: Secondary | ICD-10-CM | POA: Diagnosis present

## 2012-02-02 DIAGNOSIS — N179 Acute kidney failure, unspecified: Secondary | ICD-10-CM | POA: Diagnosis present

## 2012-02-02 DIAGNOSIS — N12 Tubulo-interstitial nephritis, not specified as acute or chronic: Principal | ICD-10-CM | POA: Diagnosis present

## 2012-02-02 DIAGNOSIS — Z66 Do not resuscitate: Secondary | ICD-10-CM | POA: Diagnosis present

## 2012-02-02 DIAGNOSIS — R6510 Systemic inflammatory response syndrome (SIRS) of non-infectious origin without acute organ dysfunction: Secondary | ICD-10-CM | POA: Diagnosis present

## 2012-02-02 DIAGNOSIS — G4733 Obstructive sleep apnea (adult) (pediatric): Secondary | ICD-10-CM | POA: Diagnosis present

## 2012-02-02 DIAGNOSIS — I5042 Chronic combined systolic (congestive) and diastolic (congestive) heart failure: Secondary | ICD-10-CM | POA: Diagnosis present

## 2012-02-02 DIAGNOSIS — K120 Recurrent oral aphthae: Secondary | ICD-10-CM | POA: Diagnosis present

## 2012-02-02 DIAGNOSIS — I480 Paroxysmal atrial fibrillation: Secondary | ICD-10-CM | POA: Insufficient documentation

## 2012-02-02 DIAGNOSIS — Z7901 Long term (current) use of anticoagulants: Secondary | ICD-10-CM

## 2012-02-02 DIAGNOSIS — R509 Fever, unspecified: Secondary | ICD-10-CM | POA: Diagnosis present

## 2012-02-02 DIAGNOSIS — C801 Malignant (primary) neoplasm, unspecified: Secondary | ICD-10-CM | POA: Insufficient documentation

## 2012-02-02 DIAGNOSIS — Z86718 Personal history of other venous thrombosis and embolism: Secondary | ICD-10-CM

## 2012-02-02 DIAGNOSIS — J810 Acute pulmonary edema: Secondary | ICD-10-CM | POA: Diagnosis not present

## 2012-02-02 DIAGNOSIS — I4891 Unspecified atrial fibrillation: Secondary | ICD-10-CM

## 2012-02-02 DIAGNOSIS — I5032 Chronic diastolic (congestive) heart failure: Secondary | ICD-10-CM

## 2012-02-02 DIAGNOSIS — I5022 Chronic systolic (congestive) heart failure: Secondary | ICD-10-CM | POA: Diagnosis present

## 2012-02-02 DIAGNOSIS — J4489 Other specified chronic obstructive pulmonary disease: Secondary | ICD-10-CM | POA: Diagnosis present

## 2012-02-02 DIAGNOSIS — H209 Unspecified iridocyclitis: Secondary | ICD-10-CM | POA: Insufficient documentation

## 2012-02-02 DIAGNOSIS — J9601 Acute respiratory failure with hypoxia: Secondary | ICD-10-CM | POA: Diagnosis not present

## 2012-02-02 DIAGNOSIS — K219 Gastro-esophageal reflux disease without esophagitis: Secondary | ICD-10-CM | POA: Insufficient documentation

## 2012-02-02 LAB — BLOOD GAS, ARTERIAL
Bicarbonate: 20 mEq/L (ref 20.0–24.0)
Patient temperature: 98.6
TCO2: 21.4 mmol/L (ref 0–100)
pH, Arterial: 7.254 — ABNORMAL LOW (ref 7.350–7.450)
pO2, Arterial: 96 mmHg (ref 80.0–100.0)

## 2012-02-02 LAB — CBC WITH DIFFERENTIAL/PLATELET
Basophils Relative: 0 % (ref 0–1)
Eosinophils Absolute: 0.3 10*3/uL (ref 0.0–0.7)
Eosinophils Relative: 2 % (ref 0–5)
MCH: 27.1 pg (ref 26.0–34.0)
MCHC: 31.7 g/dL (ref 30.0–36.0)
MCV: 85.4 fL (ref 78.0–100.0)
Monocytes Relative: 7 % (ref 3–12)
Neutrophils Relative %: 89 % — ABNORMAL HIGH (ref 43–77)
Platelets: 268 10*3/uL (ref 150–400)

## 2012-02-02 LAB — PROTIME-INR
INR: 2.45 — ABNORMAL HIGH (ref 0.00–1.49)
Prothrombin Time: 27 seconds — ABNORMAL HIGH (ref 11.6–15.2)

## 2012-02-02 LAB — COMPREHENSIVE METABOLIC PANEL
Albumin: 3.3 g/dL — ABNORMAL LOW (ref 3.5–5.2)
Albumin: 3.3 g/dL — ABNORMAL LOW (ref 3.5–5.2)
Alkaline Phosphatase: 120 U/L — ABNORMAL HIGH (ref 39–117)
BUN: 32 mg/dL — ABNORMAL HIGH (ref 6–23)
BUN: 33 mg/dL — ABNORMAL HIGH (ref 6–23)
Calcium: 9 mg/dL (ref 8.4–10.5)
Calcium: 8.9 mg/dL (ref 8.4–10.5)
Chloride: 103 mEq/L (ref 96–112)
Creatinine, Ser: 2.28 mg/dL — ABNORMAL HIGH (ref 0.50–1.10)
GFR calc Af Amer: 28 mL/min — ABNORMAL LOW (ref >90)
GFR calc non Af Amer: 20 mL/min — ABNORMAL LOW (ref >90)
Potassium: 4.2 mEq/L (ref 3.5–5.1)
Total Bilirubin: 0.5 mg/dL (ref 0.3–1.2)
Total Protein: 6.9 g/dL (ref 6.0–8.3)

## 2012-02-02 LAB — URINALYSIS, ROUTINE W REFLEX MICROSCOPIC
Glucose, UA: NEGATIVE mg/dL
Ketones, ur: NEGATIVE mg/dL
Nitrite: POSITIVE — AB
Specific Gravity, Urine: 1.018 (ref 1.005–1.030)
pH: 6 (ref 5.0–8.0)

## 2012-02-02 LAB — URINE MICROSCOPIC-ADD ON

## 2012-02-02 LAB — CBC
HCT: 36.7 % (ref 36.0–46.0)
MCHC: 31.9 g/dL (ref 30.0–36.0)
Platelets: 303 10*3/uL (ref 150–400)
RDW: 14.8 % (ref 11.5–15.5)
WBC: 14.9 10*3/uL — ABNORMAL HIGH (ref 4.0–10.5)

## 2012-02-02 LAB — TROPONIN I: Troponin I: <0.3 ng/mL (ref <0.30)

## 2012-02-02 LAB — LACTIC ACID, PLASMA: Lactic Acid, Venous: 1.3 mmol/L (ref 0.5–2.2)

## 2012-02-02 LAB — PRO B NATRIURETIC PEPTIDE: Pro B Natriuretic peptide (BNP): 4495 pg/mL — ABNORMAL HIGH (ref 0–125)

## 2012-02-02 MED ORDER — MORPHINE SULFATE 2 MG/ML IJ SOLN
1.0000 mg | INTRAMUSCULAR | Status: DC | PRN
  Administered 2012-02-02: 1 mg via INTRAVENOUS
  Filled 2012-02-02 (×2): qty 1

## 2012-02-02 MED ORDER — CARVEDILOL 25 MG PO TABS
25.0000 mg | ORAL_TABLET | Freq: Every day | ORAL | Status: DC
  Filled 2012-02-02 (×2): qty 1

## 2012-02-02 MED ORDER — FUROSEMIDE 10 MG/ML IJ SOLN
INTRAMUSCULAR | Status: AC
  Administered 2012-02-02: 60 mg via INTRAVENOUS
  Filled 2012-02-02: qty 8

## 2012-02-02 MED ORDER — DEXTROSE 5 % IV SOLN
1.0000 g | INTRAVENOUS | Status: DC
  Administered 2012-02-03 – 2012-02-04 (×2): 1 g via INTRAVENOUS
  Filled 2012-02-02 (×3): qty 10

## 2012-02-02 MED ORDER — SODIUM CHLORIDE 0.9 % IV BOLUS (SEPSIS)
500.0000 mL | Freq: Once | INTRAVENOUS | Status: AC
  Administered 2012-02-02: 500 mL via INTRAVENOUS

## 2012-02-02 MED ORDER — PREDNISOLONE ACETATE 1 % OP SUSP
1.0000 [drp] | Freq: Every day | OPHTHALMIC | Status: DC | PRN
  Filled 2012-02-02: qty 1

## 2012-02-02 MED ORDER — BUMETANIDE 0.25 MG/ML IJ SOLN
1.0000 mg | Freq: Three times a day (TID) | INTRAMUSCULAR | Status: DC
  Administered 2012-02-03: 1 mg via INTRAVENOUS
  Filled 2012-02-02 (×4): qty 4

## 2012-02-02 MED ORDER — NITROGLYCERIN 0.4 MG SL SUBL: 0.4000 mg | SUBLINGUAL_TABLET | SUBLINGUAL | Status: DC | PRN

## 2012-02-02 MED ORDER — ACETAMINOPHEN 650 MG RE SUPP: 650.0000 mg | Freq: Four times a day (QID) | RECTAL | Status: DC | PRN

## 2012-02-02 MED ORDER — LOSARTAN POTASSIUM 50 MG PO TABS
50.0000 mg | ORAL_TABLET | Freq: Every day | ORAL | Status: DC
  Filled 2012-02-02 (×2): qty 1

## 2012-02-02 MED ORDER — FLUTICASONE-SALMETEROL 100-50 MCG/DOSE IN AEPB
1.0000 | INHALATION_SPRAY | Freq: Two times a day (BID) | RESPIRATORY_TRACT | Status: DC | PRN
  Filled 2012-02-02: qty 14

## 2012-02-02 MED ORDER — WARFARIN SODIUM 2 MG PO TABS: 2.0000 mg | ORAL_TABLET | ORAL | Status: DC

## 2012-02-02 MED ORDER — MORPHINE SULFATE 2 MG/ML IJ SOLN
1.0000 mg | Freq: Once | INTRAMUSCULAR | Status: AC
  Administered 2012-02-02: 1 mg via INTRAVENOUS

## 2012-02-02 MED ORDER — CARVEDILOL 12.5 MG PO TABS
12.5000 mg | ORAL_TABLET | Freq: Every day | ORAL | Status: DC
  Administered 2012-02-02: 12.5 mg via ORAL
  Filled 2012-02-02 (×2): qty 1

## 2012-02-02 MED ORDER — FUROSEMIDE 10 MG/ML IJ SOLN
60.0000 mg | Freq: Once | INTRAMUSCULAR | Status: AC
  Administered 2012-02-02: 60 mg via INTRAVENOUS

## 2012-02-02 MED ORDER — ANASTROZOLE 1 MG PO TABS
1.0000 mg | ORAL_TABLET | Freq: Every day | ORAL | Status: DC
  Administered 2012-02-02 – 2012-02-05 (×4): 1 mg via ORAL
  Filled 2012-02-02 (×4): qty 1

## 2012-02-02 MED ORDER — FLUTICASONE-SALMETEROL 100-50 MCG/DOSE IN AEPB
1.0000 | INHALATION_SPRAY | Freq: Two times a day (BID) | RESPIRATORY_TRACT | Status: DC
  Filled 2012-02-02: qty 14

## 2012-02-02 MED ORDER — BRIMONIDINE TARTRATE 0.2 % OP SOLN
1.0000 [drp] | Freq: Every day | OPHTHALMIC | Status: DC
  Filled 2012-02-02: qty 5

## 2012-02-02 MED ORDER — CARVEDILOL 12.5 MG PO TABS: 12.5000 mg | ORAL_TABLET | Freq: Two times a day (BID) | ORAL | Status: DC

## 2012-02-02 MED ORDER — SODIUM CHLORIDE 0.9 % IJ SOLN
3.0000 mL | Freq: Two times a day (BID) | INTRAMUSCULAR | Status: DC
  Administered 2012-02-02 – 2012-02-05 (×6): 3 mL via INTRAVENOUS

## 2012-02-02 MED ORDER — BUPROPION HCL ER (SR) 150 MG PO TB12
150.0000 mg | ORAL_TABLET | Freq: Every day | ORAL | Status: DC
  Administered 2012-02-03 – 2012-02-05 (×3): 150 mg via ORAL
  Filled 2012-02-02 (×3): qty 1

## 2012-02-02 MED ORDER — FLUTICASONE-SALMETEROL 100-50 MCG/DOSE IN AEPB
1.0000 | INHALATION_SPRAY | Freq: Two times a day (BID) | RESPIRATORY_TRACT | Status: DC
  Administered 2012-02-03 – 2012-02-04 (×4): 1 via RESPIRATORY_TRACT
  Filled 2012-02-02 (×3): qty 14

## 2012-02-02 MED ORDER — PANTOPRAZOLE SODIUM 40 MG PO TBEC
80.0000 mg | DELAYED_RELEASE_TABLET | Freq: Every day | ORAL | Status: DC
  Administered 2012-02-03 – 2012-02-05 (×3): 80 mg via ORAL
  Filled 2012-02-02: qty 1
  Filled 2012-02-02: qty 2
  Filled 2012-02-02 (×2): qty 1

## 2012-02-02 MED ORDER — SODIUM CHLORIDE 0.9 % IV SOLN: INTRAVENOUS | Status: DC

## 2012-02-02 MED ORDER — ALLOPURINOL 100 MG PO TABS
100.0000 mg | ORAL_TABLET | Freq: Every day | ORAL | Status: DC
  Administered 2012-02-03 – 2012-02-05 (×3): 100 mg via ORAL
  Filled 2012-02-02 (×3): qty 1

## 2012-02-02 MED ORDER — TIMOLOL MALEATE 0.5 % OP SOLN
1.0000 [drp] | Freq: Two times a day (BID) | OPHTHALMIC | Status: DC
  Administered 2012-02-02 – 2012-02-05 (×6): 1 [drp] via OPHTHALMIC
  Filled 2012-02-02: qty 5

## 2012-02-02 MED ORDER — BRIMONIDINE TARTRATE 0.2 % OP SOLN
1.0000 [drp] | Freq: Two times a day (BID) | OPHTHALMIC | Status: DC
  Administered 2012-02-02 – 2012-02-05 (×6): 1 [drp] via OPHTHALMIC
  Filled 2012-02-02: qty 5

## 2012-02-02 MED ORDER — DEXTROSE 5 % IV SOLN
1.0000 g | Freq: Once | INTRAVENOUS | Status: AC
  Administered 2012-02-02: 1 g via INTRAVENOUS
  Filled 2012-02-02: qty 10

## 2012-02-02 MED ORDER — TRAMADOL HCL 50 MG PO TABS
50.0000 mg | ORAL_TABLET | Freq: Every evening | ORAL | Status: DC | PRN
  Filled 2012-02-02: qty 1

## 2012-02-02 MED ORDER — DEXTROSE 5 % IV SOLN: 1.0000 g | INTRAVENOUS | Status: DC

## 2012-02-02 MED ORDER — WARFARIN SODIUM 3 MG PO TABS
3.0000 mg | ORAL_TABLET | ORAL | Status: DC
  Administered 2012-02-03: 3 mg via ORAL
  Filled 2012-02-02 (×2): qty 1

## 2012-02-02 MED ORDER — ACETAMINOPHEN 325 MG PO TABS
650.0000 mg | ORAL_TABLET | Freq: Four times a day (QID) | ORAL | Status: DC | PRN
  Administered 2012-02-02 – 2012-02-05 (×3): 650 mg via ORAL
  Filled 2012-02-02 (×3): qty 2

## 2012-02-02 MED ORDER — FUROSEMIDE 10 MG/ML IJ SOLN: 60.0000 mg | Freq: Four times a day (QID) | INTRAMUSCULAR | Status: DC

## 2012-02-02 MED ORDER — ONDANSETRON HCL 4 MG/2ML IJ SOLN: 4.0000 mg | Freq: Three times a day (TID) | INTRAMUSCULAR | Status: AC | PRN

## 2012-02-02 MED ORDER — DOXAZOSIN MESYLATE 2 MG PO TABS
2.0000 mg | ORAL_TABLET | Freq: Every day | ORAL | Status: DC
  Administered 2012-02-03 – 2012-02-04 (×2): 2 mg via ORAL
  Filled 2012-02-02 (×4): qty 1

## 2012-02-02 MED ORDER — ALBUTEROL SULFATE (5 MG/ML) 0.5% IN NEBU: 2.5000 mg | INHALATION_SOLUTION | RESPIRATORY_TRACT | Status: DC | PRN

## 2012-02-02 MED ORDER — WARFARIN SODIUM 2 MG PO TABS
2.0000 mg | ORAL_TABLET | ORAL | Status: DC
  Administered 2012-02-02 – 2012-02-04 (×2): 2 mg via ORAL
  Filled 2012-02-02 (×2): qty 1

## 2012-02-02 MED ORDER — LEVOTHYROXINE SODIUM 137 MCG PO TABS
137.0000 ug | ORAL_TABLET | Freq: Every day | ORAL | Status: DC
  Administered 2012-02-03 – 2012-02-05 (×3): 137 ug via ORAL
  Filled 2012-02-02 (×4): qty 1

## 2012-02-02 MED ORDER — HYDROCODONE-ACETAMINOPHEN 5-325 MG PO TABS
1.0000 | ORAL_TABLET | Freq: Four times a day (QID) | ORAL | Status: DC | PRN
  Administered 2012-02-02 – 2012-02-03 (×2): 1 via ORAL
  Filled 2012-02-02 (×2): qty 1

## 2012-02-02 MED ORDER — WARFARIN - PHYSICIAN DOSING INPATIENT: Freq: Every day | Status: DC

## 2012-02-02 MED ORDER — CLORAZEPATE DIPOTASSIUM 3.75 MG PO TABS
7.5000 mg | ORAL_TABLET | Freq: Two times a day (BID) | ORAL | Status: DC | PRN
  Administered 2012-02-03: 7.5 mg via ORAL
  Filled 2012-02-02: qty 2

## 2012-02-02 MED ORDER — BRIMONIDINE TARTRATE 0.15 % OP SOLN
1.0000 [drp] | Freq: Every day | OPHTHALMIC | Status: DC
  Filled 2012-02-02: qty 5

## 2012-02-02 MED ORDER — LORAZEPAM 1 MG PO TABS: 1.0000 mg | ORAL_TABLET | Freq: Three times a day (TID) | ORAL | Status: DC | PRN

## 2012-02-02 NOTE — ED Provider Notes (Signed)
History     CSN: [id number]  Arrival date & time 02/02/12  1149   First MD Initiated Contact with Patient 02/02/12 1311      Chief Complaint  Patient presents with  . Dysuria  . Hematuria    (Consider location/radiation/quality/duration/timing/severity/associated sxs/prior treatment) HPI Pt presents with 3-4 days of dysuria, hematuria, fever, chills, N/V and R flank pain. Denies SOB, CP.  Past Medical History  Diagnosis Date  . Anemia   . Anxiety states   . Depressive disorder, not elsewhere classified   . Type II or unspecified type diabetes mellitus without mention of complication, not stated as uncontrolled   . Hyperlipidemia   . Unspecified essential hypertension   . Renal insufficiency   . Osteoarthritis   . Gout   . Iritis   . Nonischemic cardiomyopathy     EF 20 to 25% per echo 06/2011  . Chronic anticoagulation   . NSTEMI (non-ST elevated myocardial infarction) Feb 2013    06/2011 cath  Mild nonobstructive disease. No LV gram  . PAF (paroxysmal atrial fibrillation)   . Obesity   . Cancer 2010    Right breast  . DVT (deep venous thrombosis) 2010  . Stroke 2004    Left upper lobe  . Pulmonary edema 2013  . Thyroid disease 1969    Three forth of Thyroid removed  . Complication of anesthesia     "fights it", for colonoscopy  . Flash pulmonary edema     06/2011  . HOH (hard of hearing)     deaf in L completely  . OSA on CPAP     CPAP at night  . GERD (gastroesophageal reflux disease)   . Hypothyroidism     Past Surgical History  Procedure Date  . Breast lumpectomy   . Cataract extraction   . Foot surgery   . Tonsillectomy 1949  . Abdominal hysterectomy 1974  . Eye surgery 2008    Glaucoma shunt Right eye  . Parotid gland tumor excision 2000  . Cystectomy 1974    Left Breast, chin, left of groin area  . Thyroidectomy, partial 1969  . Endarterectomy 11/18/2011    Procedure: ENDARTERECTOMY CAROTID;  Surgeon: Fransisco Hertz, MD;  Location: [facility] OR;   Service: Vascular;  Laterality: Left;  [name] Mastectomy right  . Mastectomy   . Cardiac catheterization     no stent  . Colonoscopy w/ polypectomy   . Endarterectomy 01/12/2012    Procedure: ENDARTERECTOMY CAROTID;  Surgeon: Fransisco Hertz, MD;  Location: [facility] OR;  Service: Vascular;  Laterality: Right;  Right carotid endarterectomy with 1cm x 6cm vascuguard patch angioplasty.    Family History  Problem Relation Age of Onset  . Hypertension    . Heart disease Mother 58    CHF  . Cancer Mother     breast  . Heart disease Father 25  . Heart disease Brother     aaa    History  Substance Use Topics  . Smoking status: Former Smoker -- 2.0 packs/day for 40 years    Types: Cigarettes    Quit date: 05/25/2002  . Smokeless tobacco: Never Used  . Alcohol Use: No    OB History    Grav Para Term Preterm Abortions TAB SAB Ect Mult Living                  Review of Systems  Constitutional: Positive for fever, chills and fatigue.  HENT: Negative for neck pain.   Respiratory:  Negative for cough and shortness of breath.   Cardiovascular: Negative for chest pain and leg swelling.  Gastrointestinal: Positive for nausea and vomiting. Negative for abdominal pain and diarrhea.  Genitourinary: Positive for dysuria, hematuria and flank pain.  Musculoskeletal: Positive for back pain.  Skin: Negative for rash and wound.  Neurological: Negative for dizziness, light-headedness, numbness and headaches.    Allergies  Amlodipine; Atorvastatin; Colesevelam; Lasix; Statins; and Tape  Home Medications  No current outpatient prescriptions on file.  BP 140/39  Pulse 76  Temp 99.6 F (37.6 C) (Oral)  Resp 14  SpO2 91%  Physical Exam  Nursing note and vitals reviewed. Constitutional: She is oriented to person, place, and time. She appears well-developed and well-nourished. No distress.  HENT:  Head: Normocephalic and atraumatic.  Mouth/Throat: Oropharynx is clear and moist.  Eyes: EOM are  normal. Pupils are equal, round, and reactive to light.  Neck: Normal range of motion. Neck supple.  Cardiovascular: Normal rate and regular rhythm.   Pulmonary/Chest: Effort normal and breath sounds normal. No respiratory distress. She has no wheezes. She has no rales. She exhibits no tenderness.  Abdominal: Soft. Bowel sounds are normal. She exhibits no distension and no mass. There is tenderness (mild suprapubic TTP, no rebound or guarding). There is no rebound and no guarding.  Musculoskeletal: Normal range of motion. She exhibits tenderness (R flank tenderness to percussion). She exhibits no edema.  Neurological: She is alert and oriented to person, place, and time.  Skin: Skin is warm and dry. No rash noted. No erythema.  Psychiatric: She has a normal mood and affect. Her behavior is normal.    ED Course  Procedures (including critical care time)  Labs Reviewed  CBC WITH DIFFERENTIAL - Abnormal; Notable for the following:    WBC 16.2 (*)     RBC 3.84 (*)     Hemoglobin 10.4 (*)     HCT 32.8 (*)     Neutrophils Relative 89 (*)     Neutro Abs 14.4 (*)     Lymphocytes Relative 2 (*)     Lymphs Abs 0.3 (*)     Monocytes Absolute 1.1 (*)     All other components within normal limits  COMPREHENSIVE METABOLIC PANEL - Abnormal; Notable for the following:    Glucose, Bld 149 (*)     BUN 32 (*)     Creatinine, Ser 1.94 (*)     Albumin 3.3 (*)     Alkaline Phosphatase 120 (*)     GFR calc non Af Amer 24 (*)     GFR calc Af Amer 28 (*)     All other components within normal limits  URINALYSIS, ROUTINE W REFLEX MICROSCOPIC - Abnormal; Notable for the following:    APPearance TURBID (*)     Hgb urine dipstick LARGE (*)     Protein, ur >300 (*)     Nitrite POSITIVE (*)     Leukocytes, UA MODERATE (*)     All other components within normal limits  URINE MICROSCOPIC-ADD ON - Abnormal; Notable for the following:    Squamous Epithelial / LPF FEW (*)     Bacteria, UA FEW (*)     All  other components within normal limits  URINE CULTURE   Dg Chest 2 View  02/02/2012  *RADIOLOGY REPORT*  Clinical Data: Hypoxia, shortness of breath.  CHEST - 2 VIEW  Comparison: 11/16/2011  Findings: Cardiomegaly.  Underlying hyperinflation/COPD.  Linear densities in the lingula compatible with  scarring, stable.  No acute opacities or effusions.  No acute bony abnormality.  IMPRESSION: Cardiomegaly.  COPD/chronic changes.  No active disease.   Original Report Authenticated By: Cyndie Chime, M.D.      1. Pyelonephritis       MDM  Discussed with [facility]. Will admit for pyelonephritis        Loren Racer, MD 02/02/12 1457

## 2012-02-02 NOTE — Progress Notes (Signed)
RN was called to patient's Room by NT at 2120. Patient found to be severely dyspnic with a SaO2 of 74%.  She appeared dusky, her breaths sounds were wet. Onset was while she was returning from the bathroom. Oxygen was increased to 5 L/M, MD and PA on call paged. SaO2 increased to 81%.  Patient placed on a non rebreathing mask.  SaO2 increased to 92 % but severe dyspnea persisted. PA and MD arrived in room. Patient's condition improved after ordered medications administered.  Patient for transfer to 3300 unit.

## 2012-02-02 NOTE — ED Notes (Signed)
Woke up with 102.5 fever. Generalized body aches and pain in kidney, urinating blood.oxygen in triage 88%

## 2012-02-02 NOTE — Progress Notes (Signed)
Disposition Note  Wendy Cole, is a 76 y.o. female,   MRN: [id number]  -  DOB - 03/10/1936  Outpatient Primary MD for the patient is Sonda Primes, MD   Blood pressure 140/39, pulse 76, temperature 99.6 F (37.6 C), temperature source Oral, resp. rate 14, SpO2 91.00%.  Active Problems:  Pyelonephritis  Fever  DIABETES MELLITUS, TYPE II  HYPERLIPIDEMIA  COPD with chronic bronchitis  APHTHOUS STOMATITIS  Chronic kidney disease (CKD), stage IV (severe)  OSTEOARTHRITIS  Atrial fibrillation    76 yo female with complex PMH presents with hypoxia (88%), and 4 days of fever, chills, right flank pain, and dysuria.  In the ED her fever is 100.6, WBC is 16.2, U/A shows wbcs TNTC.    Urine cultures have been sent and the patient has been given IV rocephin.  Per the EDP the patient appears ill.  Given her PMH and current presentation, I have requested a telemetry bed.   Algis Downs, PA-C [facility] Pager: [phone]

## 2012-02-02 NOTE — Progress Notes (Signed)
Called at 2117 by Respiratory therapy regarding patient's respiratory status, upon arrival patient has labored breathing,  audible crackles, pale skin and is diaphoretic. Bedside RN on phone with MD. HR and BP elevated (see doc flowsheet) Patient on 100% NRB sats low 90s. EKG, PCXR, Labs, and ABG obtained. Multiple conversations took place with patient who was fully oriented at the time that she did not want to be intubated or undergo heroic lifesaving measures if her condition declined. Her DNR status was confirmed by her husband. Patient was given IV Morphine and sublinigual NTG by bedside RN per MD orders. MD and NP at bedside, orders received to transfer to SDU to attempt BIPAP. Patient transported by unit staff. Will continue to monitor, advised bedside RN to call with further needs.

## 2012-02-02 NOTE — ED Notes (Signed)
Patient transported to X-ray 

## 2012-02-02 NOTE — Telephone Encounter (Signed)
Pt has chills, pain all over, UTI symptoms, pain in the kidney area.  Could she be worked in today?  Pt is also leaving a msg with the Call a Nurse line.

## 2012-02-02 NOTE — Telephone Encounter (Signed)
Caller: Ulf/Spouse; Patient Name: Wendy Cole; PCP: Sonda Primes (Adults only); Best Callback Phone Number: [phone]; Reason for call:  urinary symptoms.  States onset of blood in urine 02/01/12.  Complains of flank pain.  Temp 102.5.  Spouse calling on the way to ED at [facility].  Elected to go to ED when he could not obtain an appointment in office for today.  Per bloody urine and flank pain protocols, disposition Call Provider Immediately; advised to be seen in ED due to fever and discomfort.  Info to office.

## 2012-02-02 NOTE — H&P (Signed)
[facility] History and Physical  Wendy Cole ZOX:[id number] DOB: 1936/04/19 DOA: 02/02/2012  Referring physician: Loren Racer, emergency room physician PCP: Sonda Primes, MD   Chief Complaint: Dysuria and back pain  HPI: Wendy Cole is a 76 y.o. female with past medical history of diabetes mellitus, systolic/diastolic heart failure and stage III chronic kidney disease who presents to the emergency room after having one week of dysuria and today started having significant back pain to the point where she could not take it anymore and came into the emergency room.  In the emergency room, she was noted to have a very large urinary tract infection, acute on chronic renal failure and a white blood cell count of 16.2. Patient was given a dose of IV Rocephin, started on IV fluids and hospitalists were called for admission and treatment. Patient was transferred to a telemetry bed given her history of CHF  Review of Systems: I saw the patient on the floor, she was feeling better. She complained of a mild headache. She denied any vision changes, dysphasia, chest pain, palpitations, shortness of breath, wheeze, cough, abdominal pain. She did complain of some right flank pain. She also did complain of some dysuria she been going on now for almost a week. She denied any hematuria, constipation, diarrhea, focal extremity numbness weakness or pain. Review systems otherwise negative. She states that she's not had much of an appetite for the past week or so.  Past Medical History  Diagnosis Date  . Anemia   . Anxiety states   . Depressive disorder, not elsewhere classified   . Type II or unspecified type diabetes mellitus without mention of complication, not stated as uncontrolled   . Hyperlipidemia   . Unspecified essential hypertension   . Renal insufficiency   . Osteoarthritis   . Gout   . Iritis   . Nonischemic cardiomyopathy     EF 20 to 25% per echo 06/2011  . Chronic  anticoagulation   . NSTEMI (non-ST elevated myocardial infarction) Feb 2013    06/2011 cath  Mild nonobstructive disease. No LV gram  . PAF (paroxysmal atrial fibrillation)   . Obesity   . Cancer 2010    Right breast  . DVT (deep venous thrombosis) 2010  . Stroke 2004    Left upper lobe  . Pulmonary edema 2013  . Thyroid disease 1969    Three forth of Thyroid removed  . Complication of anesthesia     "fights it", for colonoscopy  . Flash pulmonary edema     06/2011  . HOH (hard of hearing)     deaf in L completely  . OSA on CPAP     CPAP at night  . GERD (gastroesophageal reflux disease)   . Hypothyroidism    Past Surgical History  Procedure Date  . Breast lumpectomy   . Cataract extraction   . Foot surgery   . Tonsillectomy 1949  . Abdominal hysterectomy 1974  . Eye surgery 2008    Glaucoma shunt Right eye  . Parotid gland tumor excision 2000  . Cystectomy 1974    Left Breast, chin, left of groin area  . Thyroidectomy, partial 1969  . Endarterectomy 11/18/2011    Procedure: ENDARTERECTOMY CAROTID;  Surgeon: Fransisco Hertz, MD;  Location: [facility] OR;  Service: Vascular;  Laterality: Left;  [name] Mastectomy right  . Mastectomy   . Cardiac catheterization     no stent  . Colonoscopy w/ polypectomy   . Endarterectomy 01/12/2012  Procedure: ENDARTERECTOMY CAROTID;  Surgeon: Fransisco Hertz, MD;  Location: [facility] OR;  Service: Vascular;  Laterality: Right;  Right carotid endarterectomy with 1cm x 6cm vascuguard patch angioplasty.   Social History:  reports that she quit smoking about 9 years ago. Her smoking use included Cigarettes. She has a 80 pack-year smoking history. She has never used smokeless tobacco. She reports that she does not drink alcohol or use illicit drugs. Patient lives at home with her husband of 51 years., She is able to participate in most activities of daily living, limited only at times by her congestive heart failure.  Allergies  Allergen Reactions  . Amlodipine      edema  . Atorvastatin Other (See Comments)    Muscle aches  . Colesevelam Other (See Comments)    unknown  . Lasix (Furosemide) Other (See Comments)    Muscle cramps  . Statins Other (See Comments)    Muscle aches  . Tape Rash    Family History  Problem Relation Age of Onset  . Hypertension    . Heart disease Mother 3    CHF  . Cancer Mother     breast  . Heart disease Father 40  . Heart disease Brother     aaa    Prior to Admission medications   Medication Sig Start Date End Date Taking? Authorizing Provider  allopurinol (ZYLOPRIM) 100 MG tablet Take 100 mg by mouth daily.   Yes Historical Provider, MD  amLODipine (NORVASC) 5 MG tablet Take 1 tablet by mouth daily. 01/11/12  Yes Historical Provider, MD  anastrozole (ARIMIDEX) 1 MG tablet Take 1 mg by mouth daily.   Yes Historical Provider, MD  BIOTIN PO Take 1 tablet by mouth daily.   Yes Historical Provider, MD  brimonidine (ALPHAGAN) 0.15 % ophthalmic solution Place 1 drop into both eyes daily.    Yes Historical Provider, MD  buPROPion (WELLBUTRIN SR) 150 MG 12 hr tablet TAKE 1 TABLET BY MOUTH EVERY DAY 01/20/12  Yes Georgina Quint Plotnikov, MD  calcium carbonate (CALCIUM 500) 1250 MG tablet Take 1 tablet by mouth daily.   Yes Historical Provider, MD  carvedilol (COREG) 12.5 MG tablet Take 1-2 tablets (12.5-25 mg total) by mouth 2 (two) times daily with a meal. Taking 2 tablets in the morning and 1 at night 01/13/12  Yes Marlowe Shores, PA  clorazepate (TRANXENE) 7.5 MG tablet Take 7.5 mg by mouth 2 (two) times daily as needed. For anxiety   Yes Historical Provider, MD  doxazosin (CARDURA) 2 MG tablet Take 1 tablet (2 mg total) by mouth at bedtime. 12/30/11 12/29/12 Yes Georgina Quint Plotnikov, MD  Fluticasone-Salmeterol (ADVAIR DISKUS) 100-50 MCG/DOSE AEPB Inhale 1 puff into the lungs every 12 (twelve) hours as needed. For shortness of breath   Yes Historical Provider, MD  Fluticasone-Salmeterol (ADVAIR) 100-50 MCG/DOSE AEPB Inhale 1  puff into the lungs every 12 (twelve) hours.   Yes Historical Provider, MD  HYDROcodone-acetaminophen (VICODIN) 5-500 MG per tablet Take 1 tablet by mouth every 4 (four) hours as needed. For pain 01/13/12  Yes Historical Provider, MD  levothyroxine (SYNTHROID, LEVOTHROID) 137 MCG tablet Take 137 mcg by mouth daily.   Yes Historical Provider, MD  LORazepam (ATIVAN) 1 MG tablet Take 1 mg by mouth every 8 (eight) hours as needed.   Yes Historical Provider, MD  losartan (COZAAR) 50 MG tablet Take 50 mg by mouth daily.   Yes Historical Provider, MD  naproxen sodium (ANAPROX) 220 MG tablet Take  220 mg by mouth daily as needed. For pain and sleep   Yes Historical Provider, MD  omeprazole (PRILOSEC) 40 MG capsule Take 40 mg by mouth daily.    Yes Historical Provider, MD  prednisoLONE acetate (PRED FORTE) 1 % ophthalmic suspension Place 1 drop into both eyes daily as needed. For eye pain   Yes Historical Provider, MD  timolol (TIMOPTIC) 0.5 % ophthalmic solution Place 1 drop into both eyes 2 (two) times daily. Twice daily   Yes Historical Provider, MD  torsemide (DEMADEX) 20 MG tablet Take 20 mg by mouth every other day.   Yes Historical Provider, MD  traMADol (ULTRAM) 50 MG tablet Take 50 mg by mouth at bedtime as needed. For sleep and pain   Yes Historical Provider, MD  vitamin D, CHOLECALCIFEROL, 400 UNITS tablet Take 400 Units by mouth daily.    Yes Historical Provider, MD  warfarin (COUMADIN) 1 MG tablet Take 2-3 mg by mouth as directed. Take 3 mg on Mondays, Wednesdays, and Fridays. Take 2 mg on Sunday, Tuesdays, Thursdays, and Saturdays.   Yes Historical Provider, MD   Physical Exam: Filed Vitals:   02/02/12 1339 02/02/12 1501 02/02/12 1530 02/02/12 1615  BP:  99/73 121/51 132/68  Pulse:  68 66 64  Temp: 99.6 F (37.6 C)   98.5 F (36.9 C)  TempSrc: Oral     Resp:  20 19 18   Height:    5\' 4"  (1.626 m)  Weight:    97.9 kg (215 lb 13.3 oz)  SpO2:  97% 97% 94%     General:  Alert and oriented  x3, no acute distress, fatigued, looks about stated age  Eyes: Sclera nonicteric, extraocular movements are intact  ENT: Normocephalic and atraumatic mucous membranes are slightly dry  Neck: Supple, mild JVD, no appreciable thyromegaly  Cardiovascular: Regular rate and rhythm, S1-S2, soft 2/6 systolic ejection murmur  Respiratory: Clear auscultation bilaterally with decreased breath sounds at the bases  Abdomen: Soft, nontender, morbidly obese, positive bowel sounds  Skin: No skin tears or lesions  Musculoskeletal: No clubbing or cyanosis, trace pitting edema  Psychiatric: Patient is appropriate no signs of acute psychoses  Neurologic: No focal deficits  Labs on Admission:  Basic Metabolic Panel:  Lab 02/02/12 1610  NA 136  K 4.2  CL 103  CO2 20  GLUCOSE 149*  BUN 32*  CREATININE 1.94*  CALCIUM 9.0  MG --  PHOS --   Liver Function Tests:  Lab 02/02/12 1229  AST 19  ALT 12  ALKPHOS 120*  BILITOT 0.5  PROT 6.9  ALBUMIN 3.3*   CBC:  Lab 02/02/12 1229  WBC 16.2*  NEUTROABS 14.4*  HGB 10.4*  HCT 32.8*  MCV 85.4  PLT 268    BNP (last 3 results)  Basename 02/02/12 1229 08/07/11 1448 07/13/11 0217  PROBNP 4495.0* 288.0* 4254.0*   CBG: No results found for this basename: GLUCAP:5 in the last 168 hours  Radiological Exams on Admission: Dg Chest 2 View  02/02/2012   IMPRESSION: Cardiomegaly.  COPD/chronic changes.  No active disease.   Original Report Authenticated By: Cyndie Chime, M.D.      Assessment/Plan Principal problem:  Pyelonephritis: Awaiting cultures and sensitivities, treated with IV Rocephin.  Active Problems:  DIABETES MELLITUS, TYPE II:   HYPERLIPIDEMIA: Stable.   COPD with chronic bronchitis: Stable.   APHTHOUS STOMATITIS: Stable.  Chronic systolic/diastolic heart failure:Her BNP is in part elevated 2 acute on chronic renal failure. Tomorrow we likely may need  to start her home medications and possibly Lasix as well. (Patient  reports Lasix allergy, but this is listed as causing muscle cramping likely from hypokalemia and not a true allergy. Okay to use Lasix.)   Chronic kidney disease (CKD), stage IV (severe): Acute on chronic failure area patient received a bolus of IV fluids in the emergency room so for now keep her saline lock. Recheck lab morning.    OSTEOARTHRITIS: Stable.   Atrial fibrillation: Stable. Currently normal sinus rhythm. INR therapeutic.   OSA (obstructive sleep apnea): Nightly CPAP  Code Status: DO NOT RESUSCITATE Family Communication: Discussed with patient at bedside Disposition Plan: Long as she did not get to volume overloaded, could possibly discharge as early as tomorrow  Time spent: 40 minutes  Hollice Espy [facility] Pager [phone]  If 7PM-7AM, please contact night-coverage [email or web address] Password [email or web address] 02/02/2012, 6:53 PM

## 2012-02-02 NOTE — Progress Notes (Signed)
PHARMACIST - PHYSICIAN COMMUNICATION DR: Rito Ehrlich CONCERNING: Pharmacy Care Issues Regarding Warfarin Labs  RECOMMENDATION (Action Taken): A baseline and daily protime for three days has been ordered to meet the [facility] Patient safety goal and comply with the current [facility] Pharmacy & Therapeutics Committee policy.   The Pharmacy will defer all warfarin dose order changes and follow up of lab results to the prescriber unless an additional order to initiate a "pharmacy Coumadin consult" is placed.  DESCRIPTION:  While hospitalized, to be in compliance with The Joint Commission National Patient Safety Goals, all patients on warfarin must have a baseline and/or current protime prior to the administration of warfarin. Pharmacy has received your order for warfarin without these required laboratory assessments.    ASSESSMENT/PLAN:   INR is therapeutic on home regimen of 3 mg MWF and 2 mg TTSS.  Will follow-up PT/INRs with you.   Nicolette Bang, RPh Pager: [phone]  02/02/12 8:40pm

## 2012-02-02 NOTE — Telephone Encounter (Signed)
Call from pt's husband requesting to reschedule pt's lab appt. He reports pt woke up today feeling cold with shaking chills. Suggested he contact pt's PCP. May need to be seen for possible infection. Mr. Frutos voiced understanding. Request sent to schedulers to reschedule lab appt.

## 2012-02-02 NOTE — Progress Notes (Signed)
Wendy Cole Jan 07, 1936 [id number]   Brief update Called to floor by RN for sudden on set of shortness of breath and crackles. 75yo with multiple medical problems admitted this evening with pyelonephritis and acute kidney injury. Chart review shows last echo done 06/2011 showing EF 25-30%. It appears she received a 500cc NS bolus in the ED and fluids maintained at 50cc/hr since that time.   S: Very dyspneic on arrival to room. She verifies DNR/DNI, but is agreeable to Bipap.   O:  Filed Vitals:   02/02/12 1501 02/02/12 1530 02/02/12 1615 02/02/12 2100  BP: 99/73 121/51 132/68 156/74  Pulse: 68 66 64 78  Temp:   98.5 F (36.9 C) 98.2 F (36.8 C)  TempSrc:      Resp: 20 19 18 18   Height:   5\' 4"  (1.626 m)   Weight:   97.9 kg (215 lb 13.3 oz)   SpO2: 97% 97% 94% 93%   Gen: awake, alert in tripod position with increased wob CV:S1S2 RRR with SEM, trace pretibial edema bilateral LE's Resp: diffuse crackles, tachypnea GI: abdomen obese, soft, NT/ND, BS+ Psych: aox3  A/P:  1. Decompensated systolic HF/flash pulmonary edema: Again, readdressed code status with pt upon arrival to room and with husband over the phone. Pt is a DNR/DNI. She does wish for bipap if indicated. Stat CXR, ABG, cardiac enzymes. IV lasix x 1. Will trial bipap to see if pt able to tolerate. Transfer to SDU for now. Pt actually refuses any form of lasix due to hx of adverse rxn with leg cramping. States she will "walk out of the hospital" before she takes any. Will change to IV Bumex.  Pt with poor overall prognosis given multiple medical issues and current symptoms. Will monitor closely.  Cordelia Pen, NP-C [facility] Service [facility]  pgr [phone]

## 2012-02-02 NOTE — Progress Notes (Signed)
02/02/2012 2240  Pt admitted into 3314 from 3W with belongings on 100% non-rebreather. SpO2 97% RR 26. VSS with no complaints of dyspnea or pain. Family is aware of transfer and at bedside. Elink notified. Will continue to monitor.  [place] Aynsley Fleet, RN

## 2012-02-02 NOTE — Progress Notes (Signed)
Pt does not wish to be placed on Bipap at this time. Pt's breathing has improved significantly. Pt will place herself on CPAP when ready.

## 2012-02-03 ENCOUNTER — Inpatient Hospital Stay (HOSPITAL_COMMUNITY): Payer: Medicare Other

## 2012-02-03 DIAGNOSIS — N179 Acute kidney failure, unspecified: Secondary | ICD-10-CM

## 2012-02-03 DIAGNOSIS — J810 Acute pulmonary edema: Secondary | ICD-10-CM | POA: Diagnosis not present

## 2012-02-03 DIAGNOSIS — J9601 Acute respiratory failure with hypoxia: Secondary | ICD-10-CM | POA: Diagnosis not present

## 2012-02-03 DIAGNOSIS — I4891 Unspecified atrial fibrillation: Secondary | ICD-10-CM

## 2012-02-03 DIAGNOSIS — J9600 Acute respiratory failure, unspecified whether with hypoxia or hypercapnia: Secondary | ICD-10-CM

## 2012-02-03 DIAGNOSIS — R6510 Systemic inflammatory response syndrome (SIRS) of non-infectious origin without acute organ dysfunction: Secondary | ICD-10-CM | POA: Diagnosis present

## 2012-02-03 LAB — COMPREHENSIVE METABOLIC PANEL
ALT: 12 U/L (ref 0–35)
Albumin: 2.8 g/dL — ABNORMAL LOW (ref 3.5–5.2)
Alkaline Phosphatase: 102 U/L (ref 39–117)
BUN: 36 mg/dL — ABNORMAL HIGH (ref 6–23)
Chloride: 103 mEq/L (ref 96–112)
Glucose, Bld: 156 mg/dL — ABNORMAL HIGH (ref 70–99)
Potassium: 4.7 mEq/L (ref 3.5–5.1)
Sodium: 136 mEq/L (ref 135–145)
Total Bilirubin: 0.4 mg/dL (ref 0.3–1.2)

## 2012-02-03 LAB — TROPONIN I
Troponin I: <0.3 ng/mL (ref <0.30)
Troponin I: <0.3 ng/mL (ref <0.30)

## 2012-02-03 LAB — GLUCOSE, CAPILLARY: Glucose-Capillary: 154 mg/dL — ABNORMAL HIGH (ref 70–99)

## 2012-02-03 LAB — PROTIME-INR: Prothrombin Time: 29.1 seconds — ABNORMAL HIGH (ref 11.6–15.2)

## 2012-02-03 MED ORDER — CARVEDILOL 12.5 MG PO TABS
12.5000 mg | ORAL_TABLET | Freq: Two times a day (BID) | ORAL | Status: DC
  Administered 2012-02-04 (×2): 12.5 mg via ORAL
  Administered 2012-02-05: 25 mg via ORAL
  Filled 2012-02-03 (×5): qty 2

## 2012-02-03 MED ORDER — LEVALBUTEROL HCL 0.63 MG/3ML IN NEBU
0.6300 mg | INHALATION_SOLUTION | RESPIRATORY_TRACT | Status: DC | PRN
  Filled 2012-02-03: qty 3

## 2012-02-03 MED ORDER — OXYCODONE HCL 5 MG PO TABS: 5.0000 mg | ORAL_TABLET | ORAL | Status: DC | PRN

## 2012-02-03 NOTE — Progress Notes (Signed)
Utilization review completed.  

## 2012-02-03 NOTE — Progress Notes (Signed)
[facility] Progress Note [place] TEAM 1 - Stepdown/ICU TEAM   TANE BIEGLER ZOX:[id number] DOB: [birth year] DOA: 02/02/2012 PCP: Sonda Primes, MD  Brief narrative: 76 y.o. female with past medical history of diabetes mellitus, systolic/diastolic heart failure and stage III chronic kidney disease who presented to the emergency room after having one week of dysuria followed by significant back pain to the point where she could not take it any more.  In the emergency room, she was noted to have a urinary tract infection, acute on chronic renal failure, and a white blood cell count of 16.2. Patient was given a dose of IV Rocephin, started on IV fluids and hospitalists were called for admission and treatment.   Assessment/Plan:  Pyelonephritis Cont empiric abx - f/u cx as more data becomes available  Acute on Chronic kidney disease stage IV Baseline creatinine in August 2013 was noted to be approximately 1.5-1.9 - patient's creatinine has been steadily climbing since her admission - d/c ARB/ACE - follow trend - can't tolerate IVF resuscitation - check renal US to r/o abscess/hyrdro/etc  Acute hypoxic respiratory distress Most consistent with acute pulmonary edema - improved after diuresis of approximately 1 L - pt now refusing to take lasix or bumex - wants only torsemide if diuresis necessary - will avoid further diuresis if able given worsening renal fxn   Diabetes mellitus type 2 Reasonable controlled at present - follow trend and cont SSI  Hyperlipidemia Resume home tx regimen  COPD with chronic bronchitis Well compensated at present time   Chronic systolic and diastolic congestive heart failure - NICM Ejection fraction 20-25% via echocardiogram February 2013 - patient desires CHF education and f/u in CHF clinic if able - will consult CHF Team in AM (is currently 5PM)  Paroxysmal Atrial fibrillation On chronic anticoagulation - rate presently controlled    Obstructive sleep apnea Resume home CPAP regimen   Hypertension BP fluctuating at present - follow trend  History of DVT Anticoagulated (followed by Dr. Truett Perna per pt report)  History of hyperthyroidism status post partial thyroidectomy with resultant hypothyroidism TSH at goal in April 2013  Code Status: DO NOT RESUSCITATE Disposition Plan: remain in SDU 24hrs - if stable in AM, transfer to CHF floor  Consultants: None presently - planned consult to CHF Team in AM  Procedures: none  Antibiotics: Rocephin 02/02/2012>>  DVT prophylaxis: Coumadin anticoag  HPI/Subjective: The pt is feeling "much better" at present.  She denies sob at this time.  She denies cp, f/c, n/v, abdom pain, or CVA tenderness (wch was present at admit).  She and her husband have a very long list of questions regarding chronic control of her medical issues, and I have spent over alone answering their questions at the bedside.   Objective: Blood pressure 149/36, pulse 55, temperature 97.7 F (36.5 C), temperature source Oral, resp. rate 18, height 5\' 4"  (1.626 m), weight 98.4 kg (216 lb 14.9 oz), SpO2 97.00%.  Intake/Output Summary (Last 24 hours) at 02/03/12 1450 Last data filed at 02/03/12 1216  Gross per 24 hour  Intake    740 ml  Output    925 ml  Net   -185 ml     Exam: General: No acute respiratory distress at present Lungs: mild bibasilar crackles - no wheeze  Cardiovascular: RRR w/o gallup or rub at present  Abdomen: Nontender, nondistended, soft, bowel sounds positive, no rebound, no ascites, no appreciable mass Extremities: No significant cyanosis, clubbing, or edema bilateral lower extremities on  exam today   Data Reviewed: Basic Metabolic Panel:  Lab 02/03/12 4782 02/02/12 2140 02/02/12 1229  NA 136 136 136  K 4.7 4.7 4.2  CL 103 103 103  CO2 21 20 20   GLUCOSE 156* 202* 149*  BUN 36* 33* 32*  CREATININE 2.43* 2.28* 1.94*  CALCIUM 8.6 8.9 9.0  MG -- -- --   PHOS -- -- --   Liver Function Tests:  Lab 02/03/12 0242 02/02/12 2140 02/02/12 1229  AST 20 23 19   ALT 12 12 12   ALKPHOS 102 127* 120*  BILITOT 0.4 0.5 0.5  PROT 6.4 7.5 6.9  ALBUMIN 2.8* 3.3* 3.3*   CBC:  Lab 02/02/12 2140 02/02/12 1229  WBC 14.9* 16.2*  NEUTROABS -- 14.4*  HGB 11.7* 10.4*  HCT 36.7 32.8*  MCV 86.8 85.4  PLT 303 268   Cardiac Enzymes:  Lab 02/03/12 1043 02/03/12 0252 02/02/12 2140  CKTOTAL -- -- --  CKMB -- -- --  CKMBINDEX -- -- --  TROPONINI <0.30 <0.30 <0.30   BNP (last 3 results)  Basename 02/02/12 1229 08/07/11 1448 07/13/11 0217  PROBNP 4495.0* 288.0* 4254.0*   CBG:  Lab 02/02/12 2250  GLUCAP 154*   Studies:  Recent x-ray studies have been reviewed in detail by the Attending Physician  Scheduled Meds:  Reviewed in detail by the Attending Physician   Lonia Blood, MD [facility] Office  [phone] Pager [phone]  On-Call/Text Page:      [email or web address]      password [email or web address]  If 7PM-7AM, please contact night-coverage [email or web address] Password [email or web address] 02/03/2012, 2:50 PM   LOS: 1 day

## 2012-02-04 ENCOUNTER — Encounter (HOSPITAL_COMMUNITY): Payer: Self-pay

## 2012-02-04 ENCOUNTER — Telehealth: Payer: Self-pay | Admitting: *Deleted

## 2012-02-04 DIAGNOSIS — I5032 Chronic diastolic (congestive) heart failure: Secondary | ICD-10-CM

## 2012-02-04 LAB — CBC
HCT: 30.9 % — ABNORMAL LOW (ref 36.0–46.0)
MCV: 86.3 fL (ref 78.0–100.0)
RBC: 3.58 MIL/uL — ABNORMAL LOW (ref 3.87–5.11)
WBC: 8.6 10*3/uL (ref 4.0–10.5)

## 2012-02-04 LAB — BASIC METABOLIC PANEL
CO2: 24 mEq/L (ref 19–32)
Chloride: 104 mEq/L (ref 96–112)
Glucose, Bld: 116 mg/dL — ABNORMAL HIGH (ref 70–99)
Sodium: 138 mEq/L (ref 135–145)

## 2012-02-04 LAB — PROTIME-INR: INR: 2.78 — ABNORMAL HIGH (ref 0.00–1.49)

## 2012-02-04 MED ORDER — LABETALOL HCL 5 MG/ML IV SOLN
10.0000 mg | INTRAVENOUS | Status: DC | PRN
  Administered 2012-02-04: 10 mg via INTRAVENOUS
  Filled 2012-02-04 (×2): qty 4

## 2012-02-04 MED ORDER — FLUTICASONE-SALMETEROL 100-50 MCG/DOSE IN AEPB
1.0000 | INHALATION_SPRAY | Freq: Two times a day (BID) | RESPIRATORY_TRACT | Status: DC | PRN
  Filled 2012-02-04: qty 14

## 2012-02-04 NOTE — Progress Notes (Signed)
Pt to be transferred to [id number].  Report called to [facility].  Pt without complaints, husband at bedside and aware of transfer and room number.

## 2012-02-04 NOTE — Progress Notes (Signed)
Received pt from unit. Pt alert and oriented to room. Bed low and call bell in reach. Pt remains stable. Wendy Cole, Wendy Cole

## 2012-02-04 NOTE — Progress Notes (Signed)
Pt transferred at 1615.  No complaints.

## 2012-02-04 NOTE — Progress Notes (Signed)
Patient wished to skip wearing CPAP tonight, as she doesn't like the way our machines work compared to her home unit. I encouraged her to call if she changed her mind.

## 2012-02-04 NOTE — Progress Notes (Signed)
[facility] Progress Note Wendy Cole - Stepdown/ICU TEAM   Wendy Cole ZOX:[id number] DOB: [birth year] DOA: 02/02/2012 PCP: Sonda Primes, MD  Brief narrative: 76 y.o. female with past medical history of diabetes mellitus, systolic/diastolic heart failure and stage III chronic kidney disease who presented to the emergency room after having one week of dysuria followed by significant back pain to the point where she could not take it any more.  In the emergency room, she was noted to have a urinary tract infection, acute on chronic renal failure, and a white blood cell count of 16.2. Patient was given a dose of IV Rocephin, started on IV fluids and hospitalists were called for admission and treatment.   Assessment/Plan:  Pyelonephritis Cont empiric abx - culture reveals E. Coli- pt improving on Rocephin  Acute on Chronic kidney disease stage IV Baseline creatinine in August 2013 was noted to be approximately Cole.5-Cole.9 - patient's creatinine has been steadily climbing since her admission - d/c ARB/ACE - follow trend - IVFs resulted in pulmonary edema and therefore were held -  renal US reveals previous left renal cyst is slightly larger  Acute hypoxic respiratory distress Most consistent with acute pulmonary edema - improved after diuresis of approximately Cole L - pt now refusing to take lasix or bumex -at this point does not require further diuresis.    Diabetes mellitus type 2 Reasonable controlled at present - follow trend and cont SSI  Hyperlipidemia Resume home tx regimen  COPD with chronic bronchitis Well compensated at present time   Chronic systolic and diastolic congestive heart failure - NICM Ejection fraction 20-25% via echocardiogram February 2013 - patient desires CHF education and f/u in CHF clinic if able -   Paroxysmal Atrial fibrillation On chronic anticoagulation - rate presently controlled   Obstructive sleep apnea Resume home CPAP regimen    Hypertension BP fluctuating elevated- add PRN labetalol-  f/u on home BP meds and resume.   History of DVT Anticoagulated (followed by Dr. Truett Perna per pt report)  History of hyperthyroidism status post partial thyroidectomy with resultant hypothyroidism TSH at goal in April 2013  Code Status: DO NOT RESUSCITATE Disposition Plan: remain in SDU 24hrs - if stable in AM, transfer to CHF floor  Consultants: None presently - planned consult to CHF Team in AM  Procedures: none  Antibiotics: Rocephin 02/02/2012>>  DVT prophylaxis: Coumadin anticoag  HPI/Subjective: The patient is sitting up in a chair. Admits that right flank pain and burning with urination are improving. She feels stronger. Medication rec reviewed with the patient and at this point she cannot recall what she is taking but is sure that she is no longer on Norvasc - I have asked her husband to bring in her medication list and allow [place] to reconcile her meds appropriately.   Objective: Blood pressure 185/78, pulse 66, temperature 98.6 F (37 C), temperature source Oral, resp. rate 19, height 5\' 4"  (Cole.626 m), weight 97.569 kg (215 lb Cole.6 oz), SpO2 96.00%.  Intake/Output Summary (Last 24 hours) at 02/04/12 1748 Last data filed at 02/04/12 1744  Gross per 24 hour  Intake    321 ml  Output   2301 ml  Net  -1980 ml     Exam: General: No acute respiratory distress at present Lungs: mild bibasilar crackles - no wheeze  Cardiovascular: RRR w/o gallup or rub at present  Abdomen: Nontender, nondistended, soft, bowel sounds positive, no rebound, no ascites, no appreciable mass Extremities: No significant cyanosis, clubbing,  or edema bilateral lower extremities on exam today   Data Reviewed: Basic Metabolic Panel:  Lab 02/04/12 2130 02/03/12 0242 02/02/12 2140 02/02/12 1229  NA 138 136 136 136  K 3.9 4.7 4.7 4.2  CL 104 103 103 103  CO2 24 21 20 20   GLUCOSE 116* 156* 202* 149*  BUN 37* 36* 33* 32*  CREATININE  2.15* 2.43* 2.28* Cole.94*  CALCIUM 9.Cole 8.6 8.9 9.0  MG -- -- -- --  PHOS -- -- -- --   Liver Function Tests:  Lab 02/03/12 0242 02/02/12 2140 02/02/12 1229  AST 20 23 19   ALT 12 12 12   ALKPHOS 102 127* 120*  BILITOT 0.4 0.5 0.5  PROT 6.4 7.5 6.9  ALBUMIN 2.8* 3.3* 3.3*   CBC:  Lab 02/04/12 0450 02/02/12 2140 02/02/12 1229  WBC 8.6 14.9* 16.2*  NEUTROABS -- -- 14.4*  HGB 9.6* 11.7* 10.4*  HCT 30.9* 36.7 32.8*  MCV 86.3 86.8 85.4  PLT 260 303 268   Cardiac Enzymes:  Lab 02/03/12 1043 02/03/12 0252 02/02/12 2140  CKTOTAL -- -- --  CKMB -- -- --  CKMBINDEX -- -- --  TROPONINI <0.30 <0.30 <0.30   BNP (last 3 results)  Basename 02/02/12 1229 08/07/11 1448 07/13/11 0217  PROBNP 4495.0* 288.0* 4254.0*   CBG:  Lab 02/02/12 2250  GLUCAP 154*   Studies:  Recent x-ray studies have been reviewed in detail by the Attending Physician  Scheduled Meds:  Reviewed in detail by the Attending Physician   Calvert Cantor, MD [phone]  If 7PM-7AM, please contact night-coverage [email or web address] Password [email or web address] 02/04/2012, 5:48 PM   LOS: 2 days

## 2012-02-04 NOTE — Telephone Encounter (Signed)
Left voice message to inform the patient of the new date and time of the lab only appointment

## 2012-02-04 NOTE — Evaluation (Signed)
Physical Therapy Evaluation Patient Details Name: Wendy Cole MRN: [id number] DOB: Aug 26, 1935 Today's Date: 02/04/2012 Time: 9629-5284 PT Time Calculation (min): 21 min  PT Assessment / Plan / Recommendation Clinical Impression  Pt admitted with UTI, back pain and flash pulmonary edema. Pt mobilizing well but states she is slower and weaker than normal and would benefit from acute therapy to maximize independence prior to return home. Pt encouraged to ambulate with nursing and increase bil LE activity.     PT Assessment  Patient needs continued PT services    Follow Up Recommendations  No PT follow up    Barriers to Discharge None      Equipment Recommendations  None recommended by PT    Recommendations for Other Services     Frequency Min 3X/week    Precautions / Restrictions Precautions Precautions: Fall   Pertinent Vitals/Pain No pain       Mobility  Bed Mobility Bed Mobility: Supine to Sit;Sit to Supine Supine to Sit: 6: Modified independent (Device/Increase time);HOB flat Sit to Supine: 6: Modified independent (Device/Increase time);HOB flat Transfers Transfers: Sit to Stand;Stand to Sit Sit to Stand: 6: Modified independent (Device/Increase time) Stand to Sit: 6: Modified independent (Device/Increase time) Ambulation/Gait Ambulation/Gait Assistance: 5: Supervision Ambulation Distance (Feet): 600 Feet Assistive device: None Ambulation/Gait Assistance Details: Pt with decreased speed and increased sway but no full LOB, pt states at times she will walk with a cane. Denied use of RW as she states she prefers the cane Gait Pattern: Step-through pattern;Decreased stride length Gait velocity: decreased Stairs: Yes Stairs Assistance: 6: Modified independent (Device/Increase time) Stair Management Technique: One rail Right;One rail Left Number of Stairs: 3     Exercises     PT Diagnosis: Difficulty walking  PT Problem List: Decreased activity tolerance PT  Treatment Interventions: Gait training;Functional mobility training;Therapeutic activities;Therapeutic exercise;Patient/family education   PT Goals Acute Rehab PT Goals PT Goal Formulation: With patient Time For Goal Achievement: 02/11/12 Potential to Achieve Goals: Good Pt will go Sit to Stand: Independently PT Goal: Sit to Stand - Progress: Goal set today Pt will go Stand to Sit: Independently PT Goal: Stand to Sit - Progress: Goal set today Pt will Ambulate: with modified independence;>150 feet;with least restrictive assistive device PT Goal: Ambulate - Progress: Goal set today  Visit Information  Last PT Received On: 02/04/12 Assistance Needed: +1    Subjective Data  Subjective: I'm just slower than usual. I'm a retired Engineer, agricultural. Patient Stated Goal: get back to my life, birds and grandkids   Prior Functioning  Home Living Lives With: Spouse Available Help at Discharge: Family;Available 24 hours/day Type of Home: House Home Access: Stairs to enter [employer] of Steps: 3 Entrance Stairs-Rails: Right;Left Home Layout: One level Firefighter: Standard Home Adaptive Equipment: None Prior Function Level of Independence: Independent Able to Take Stairs?: Yes Driving: No Vocation: Retired Musician: Other (comment) (deaf in left ear)    Cognition  Overall Cognitive Status: Appears within functional limits for tasks assessed/performed Arousal/Alertness: Awake/alert Orientation Level: Appears intact for tasks assessed Behavior During Session: [facility] for tasks performed    Extremity/Trunk Assessment Right Upper Extremity Assessment RUE ROM/Strength/Tone: [facility] for tasks assessed Left Upper Extremity Assessment LUE ROM/Strength/Tone: WFL for tasks assessed Right Lower Extremity Assessment RLE ROM/Strength/Tone: WFL for tasks assessed RLE Coordination: WFL - gross/fine motor Left Lower Extremity Assessment LLE ROM/Strength/Tone: WFL for  tasks assessed LLE Coordination: WFL - gross/fine motor Trunk Assessment Trunk Assessment: Normal   Balance  End of Session PT - End of Session Activity Tolerance: Patient tolerated treatment well Patient left: in bed;with call bell/phone within reach Nurse Communication: Mobility status  GP     [name] 02/04/2012, 5:21 PM  Delaney Meigs, PT [phone]

## 2012-02-05 ENCOUNTER — Telehealth: Payer: Self-pay | Admitting: Internal Medicine

## 2012-02-05 LAB — URINE CULTURE

## 2012-02-05 MED ORDER — CIPROFLOXACIN HCL 500 MG PO TABS: 500.0000 mg | ORAL_TABLET | Freq: Two times a day (BID) | ORAL | Status: AC

## 2012-02-05 NOTE — Progress Notes (Signed)
AVS reviewed with pt and husband at bedside. Teach back method used. Pt able to verbalize understanding of AVS. Questions answered. IV Dc'd. Home C-pap mask taken with pt home. Pt escorted via wheelchair to exit of facility. Wendy Cole, Wendy Cole Randy

## 2012-02-05 NOTE — Telephone Encounter (Signed)
Spouse is requesting 1 wk post hosp from today: 02/05/12---You do not have availability.  Thank you for your reply

## 2012-02-05 NOTE — Progress Notes (Signed)
Physical Therapy Treatment Patient Details Name: Wendy Cole MRN: [id number] DOB: 1936-05-17 Today's Date: 02/05/2012 Time: 4098-1191 PT Time Calculation (min): 24 min  PT Assessment / Plan / Recommendation Comments on Treatment Session  Pt admitted with UTI and flash pulmonary edema. Pt mobilizing well today but still not back to baseline gait. Pt encouraged to ambulate with staff and to continue bil LE HEP as able. Will follow anticipate discharge after one more visit.     Follow Up Recommendations       Barriers to Discharge        Equipment Recommendations       Recommendations for Other Services    Frequency     Plan Discharge plan remains appropriate;Frequency remains appropriate    Precautions / Restrictions Precautions Precautions: Fall Restrictions Weight Bearing Restrictions: No   Pertinent Vitals/Pain 3/10 right hip pain, chronic    Mobility  Bed Mobility Bed Mobility: Not assessed Transfers Sit to Stand: 6: Modified independent (Device/Increase time);From bed;From chair/3-in-1 Stand to Sit: 6: Modified independent (Device/Increase time);To chair/3-in-1 Ambulation/Gait Ambulation/Gait Assistance: 5: Supervision Ambulation Distance (Feet): 1000 Feet Assistive device: None Ambulation/Gait Assistance Details: Pt continues to demonstrate decreased speed with 2 partial LOB able to correct without assist pt without cane present Gait Pattern: Step-through pattern;Decreased stride length Gait velocity: 81ft/21 sec= 1.68ft/sec    Exercises General Exercises - Lower Extremity Long Arc Quad: AROM;Both;15 reps Hip Flexion/Marching: AROM;Both;20 reps;Seated   PT Diagnosis:    PT Problem List:   PT Treatment Interventions:     PT Goals Acute Rehab PT Goals PT Goal: Sit to Stand - Progress: Progressing toward goal PT Goal: Stand to Sit - Progress: Progressing toward goal PT Goal: Ambulate - Progress: Progressing toward goal  Visit Information  Last PT  Received On: 02/05/12 Assistance Needed: +1    Subjective Data  Subjective: I'm still slower but better   Cognition  Overall Cognitive Status: Appears within functional limits for tasks assessed/performed Arousal/Alertness: Awake/alert Orientation Level: Appears intact for tasks assessed Behavior During Session: [facility] for tasks performed    Balance     End of Session PT - End of Session Activity Tolerance: Patient tolerated treatment well Patient left: in chair;with call bell/phone within reach;with family/visitor present   GP     Toney Sang Beth 02/05/2012, 1:33 PM Delaney Meigs, PT [phone]

## 2012-02-05 NOTE — Plan of Care (Signed)
Problem: Food- and Nutrition-Related Knowledge Deficit (NB-1.1) Goal: Nutrition education Formal process to instruct or train a patient/client in a skill or to impart knowledge to help patients/clients voluntarily manage or modify food choices and eating behavior to maintain or improve health.  Outcome: Completed/Met Date Met:  02/05/12 Nutrition Education Note  RD consulted for nutrition education regarding a Heart Healthy diet.   Lipid Panel     Component Value Date/Time    CHOL 221* 09/09/2011 1506    TRIG 286.0* 09/09/2011 1506    HDL 32.10* 09/09/2011 1506    CHOLHDL 7 09/09/2011 1506    VLDL 57.2* 09/09/2011 1506    LDLCALC  Value: 154        Total Cholesterol/HDL:CHD Risk Coronary Heart Disease Risk Table                     Men   Women  1/2 Average Risk   3.4   3.3  Average Risk       5.0   4.4  2 X Average Risk   9.6   7.1  3 X Average Risk 23.4   11.0        Use the calculated Patient Ratio above and the CHD Risk Table to determine the patient's CHD Risk.        ATP III CLASSIFICATION (LDL):  <100     mg/dL  Optimal  086-578  mg/dL   Near or Above                   Optimal  130-159  mg/dL   Borderline  469-629  mg/dL   High  >528     mg/dL   Very High* 09/05/2438 0420    RD provided "Low Sodium Nutrition Therapy" handout from the Academy of Nutrition and Dietetics. Reviewed patient's dietary recall. Provided examples on ways to decrease sodium and fat intake in diet. Discouraged intake of processed foods and use of salt shaker. Encouraged fresh fruits and vegetables as well as whole grain sources of carbohydrates to maximize fiber intake.  Pt was not interested in diet education. Pt made several comments such as "I am not going to fool with all of that" or "I have cooked this way for 50 years" that indicates pt is not willing to make changes at this time. Pt also avoided eye contact. Pt family in the room did ask some questions. RD encouraged pt to just focus on fresh foods in place of  processed foods.   Expect poor compliance.  Body mass index is 36.75 kg/(m^2). Pt meets criteria for Obesity class 2 based on current BMI.  Current diet order is Low sodium, patient is consuming approximately 95-100% of meals at this time. Labs and medications reviewed. No further nutrition interventions warranted at this time. RD contact information provided. If additional nutrition issues arise, please re-consult RD.  Clarene Duke RD, LDN Pager [phone] After Hours pager [phone]

## 2012-02-05 NOTE — Telephone Encounter (Signed)
Ok to work in wherever there isn't already a double book.

## 2012-02-07 NOTE — Assessment & Plan Note (Signed)
No obvious acute problems except for the episodes of dyspnea as described in the HPI. These might be flash pulmonary edema. Says they happen when she sits in the evening after eating, we discussed reflux as a possibility.  are going to try to stabilize her airway better I educated her on standard uses Advair 1 puff twice daily instead of 2 or 3 puffs once daily as needed.

## 2012-02-07 NOTE — Assessment & Plan Note (Signed)
Continued good compliance and control 

## 2012-02-08 ENCOUNTER — Other Ambulatory Visit: Payer: Medicare Other | Admitting: Lab

## 2012-02-08 NOTE — Progress Notes (Signed)
I was present and agree c contents of Ms Wendy Cole note

## 2012-02-10 ENCOUNTER — Telehealth: Payer: Self-pay | Admitting: *Deleted

## 2012-02-10 ENCOUNTER — Other Ambulatory Visit: Payer: Medicare Other | Admitting: Lab

## 2012-02-10 NOTE — Telephone Encounter (Signed)
Left VM to come in on 10/4 as scheduled for PT/INR unless they want to move this to same day as cardiac MD visit on 10/3--if so, call scheduler to make change. Informed him that 10/21 appointment is to see MD.

## 2012-02-10 NOTE — Telephone Encounter (Signed)
Last PT/INR in hospital on 02/05/12. Asks if OK to wait till 03/14/12 for next lab?

## 2012-02-12 ENCOUNTER — Ambulatory Visit (INDEPENDENT_AMBULATORY_CARE_PROVIDER_SITE_OTHER): Payer: Medicare Other | Admitting: Internal Medicine

## 2012-02-12 ENCOUNTER — Encounter: Payer: Self-pay | Admitting: Internal Medicine

## 2012-02-12 ENCOUNTER — Other Ambulatory Visit (INDEPENDENT_AMBULATORY_CARE_PROVIDER_SITE_OTHER): Payer: Medicare Other

## 2012-02-12 VITALS — BP 140/60 | HR 68 | Temp 96.5°F | Resp 16 | Wt 218.0 lb

## 2012-02-12 DIAGNOSIS — J810 Acute pulmonary edema: Secondary | ICD-10-CM

## 2012-02-12 DIAGNOSIS — Z23 Encounter for immunization: Secondary | ICD-10-CM

## 2012-02-12 DIAGNOSIS — E119 Type 2 diabetes mellitus without complications: Secondary | ICD-10-CM

## 2012-02-12 DIAGNOSIS — N12 Tubulo-interstitial nephritis, not specified as acute or chronic: Secondary | ICD-10-CM

## 2012-02-12 DIAGNOSIS — I10 Essential (primary) hypertension: Secondary | ICD-10-CM

## 2012-02-12 DIAGNOSIS — I6523 Occlusion and stenosis of bilateral carotid arteries: Secondary | ICD-10-CM

## 2012-02-12 DIAGNOSIS — E1169 Type 2 diabetes mellitus with other specified complication: Secondary | ICD-10-CM

## 2012-02-12 DIAGNOSIS — E1159 Type 2 diabetes mellitus with other circulatory complications: Secondary | ICD-10-CM

## 2012-02-12 DIAGNOSIS — Z9989 Dependence on other enabling machines and devices: Secondary | ICD-10-CM

## 2012-02-12 DIAGNOSIS — I658 Occlusion and stenosis of other precerebral arteries: Secondary | ICD-10-CM

## 2012-02-12 DIAGNOSIS — G4733 Obstructive sleep apnea (adult) (pediatric): Secondary | ICD-10-CM

## 2012-02-12 DIAGNOSIS — E669 Obesity, unspecified: Secondary | ICD-10-CM

## 2012-02-12 LAB — URINALYSIS, ROUTINE W REFLEX MICROSCOPIC
Bilirubin Urine: NEGATIVE
Hgb urine dipstick: NEGATIVE
Ketones, ur: NEGATIVE
Nitrite: NEGATIVE
Total Protein, Urine: 30
Urine Glucose: NEGATIVE

## 2012-02-12 NOTE — Assessment & Plan Note (Signed)
Continue with current CPAP O2 sats were ok

## 2012-02-12 NOTE — Assessment & Plan Note (Signed)
She did well

## 2012-02-12 NOTE — Assessment & Plan Note (Signed)
Continue with current prescription therapy as reflected on the Med list.  

## 2012-02-12 NOTE — Assessment & Plan Note (Signed)
Poss cath related - treated, resolved

## 2012-02-12 NOTE — Patient Instructions (Addendum)
Wt Readings from Last 3 Encounters:  02/12/12 218 lb (98.884 kg)  02/04/12 214 lb 1.6 oz (97.115 kg)  01/29/12 222 lb 6.4 oz (100.88 kg)   BP Readings from Last 3 Encounters:  02/12/12 140/60  02/05/12 136/75  01/29/12 124/80

## 2012-02-12 NOTE — Progress Notes (Signed)
Subjective:    [name], female    DOB: [birth year], 76 y.o.   MRN: [id number]  HPI  F/u her recent hospital stay for E coli UTI a week ago - she is here for post-hospital visit and coordination of care.  F/u L carotid repair and now s/p R carotid surgery  The patient presents for a follow-up of  chronic hypertension, chronic dyslipidemia, type 2 diabetes, h/o CHF controlled with medicines   Wt Readings from Last 3 Encounters:  02/12/12 218 lb (98.884 kg)  02/04/12 214 lb 1.6 oz (97.115 kg)  01/29/12 222 lb 6.4 oz (100.88 kg)   BP Readings from Last 3 Encounters:  02/12/12 140/60  02/05/12 136/75  01/29/12 124/80        Review of Systems  Constitutional: Negative for chills, activity change, appetite change, fatigue and unexpected weight change.  HENT: Negative for congestion, mouth sores and sinus pressure.   Eyes: Negative for visual disturbance.  Respiratory: Negative for cough and chest tightness.   Gastrointestinal: Negative for nausea and abdominal pain.  Genitourinary: Negative for frequency, difficulty urinating and vaginal pain.  Musculoskeletal: Positive for back pain, arthralgias and gait problem. Negative for joint swelling.  Skin: Negative for pallor and rash.  Neurological: Negative for dizziness, tremors, weakness, numbness and headaches.  Psychiatric/Behavioral: Negative for suicidal ideas, confusion and disturbed wake/sleep cycle.        Objective:   Physical Exam  Constitutional: She appears well-developed. No distress.  HENT:  Head: Normocephalic.  Right Ear: External ear normal.  Left Ear: External ear normal.  Nose: Nose normal.  Mouth/Throat: Oropharynx is clear and moist.  Eyes: Conjunctivae normal are normal. Pupils are equal, round, and reactive to light. Right eye exhibits no discharge. Left eye exhibits no discharge.  Neck: Normal range of motion. Neck supple. No JVD present. No tracheal deviation present. No  thyromegaly present.  Cardiovascular: Normal rate, regular rhythm and normal heart sounds.   Pulmonary/Chest: No stridor. No respiratory distress. She has no wheezes.  Abdominal: Soft. Bowel sounds are normal. She exhibits no distension and no mass. There is no tenderness. There is no rebound and no guarding.  Musculoskeletal: She exhibits no edema and no tenderness.  Lymphadenopathy:    She has no cervical adenopathy.  Neurological: She displays normal reflexes. No cranial nerve deficit. She exhibits normal muscle tone. Coordination normal.       cane  Skin: No rash noted. No erythema.  Psychiatric: She has a normal mood and affect. Her behavior is normal. Judgment and thought content normal.    Lab Results  Component Value Date   WBC 8.6 02/04/2012   HGB 9.6* 02/04/2012   HCT 30.9* 02/04/2012   PLT 260 02/04/2012   GLUCOSE 116* 02/04/2012   CHOL 221* 09/09/2011   TRIG 286.0* 09/09/2011   HDL 32.10* 09/09/2011   LDLDIRECT 144.6 09/09/2011   LDLCALC  Value: 154        Total Cholesterol/HDL:CHD Risk Coronary Heart Disease Risk Table                     Men   Women  1/2 Average Risk   3.4   3.3  Average Risk       5.0   4.4  2 X Average Risk   9.6   7.1  3 X Average Risk  23.4   11.0        Use the calculated Patient Ratio above and the  CHD Risk Table to determine the patient's CHD Risk.        ATP III CLASSIFICATION (LDL):  <100     mg/dL   Optimal  161-096  mg/dL   Near or Above                    Optimal  130-159  mg/dL   Borderline  045-409  mg/dL   High  >811     mg/dL   Very High* 02/06/7828   ALT 12 02/03/2012   AST 20 02/03/2012   NA 138 02/04/2012   K 3.9 02/04/2012   CL 104 02/04/2012   CREATININE 2.15* 02/04/2012   BUN 37* 02/04/2012   CO2 24 02/04/2012   TSH 0.92 09/09/2011   INR 2.69* 02/05/2012   HGBA1C 6.3 12/09/2011   MICROALBUR 3.1* 09/27/2006          Assessment & Plan:

## 2012-02-12 NOTE — Assessment & Plan Note (Signed)
Resolved

## 2012-02-17 NOTE — Discharge Summary (Signed)
Physician Discharge Summary  [name] MRN: [id number] DOB/AGE: 1935-08-09 76 y.o.  Admit date: 02/02/2012 Discharge date: 02/05/2012  Primary Care Physician:  Sonda Primes, MD   Discharge Diagnoses:    Principal Problem:  *SIRS (systemic inflammatory response syndrome) Active Problems:  DIABETES MELLITUS, TYPE II  HYPERLIPIDEMIA  History of DVT (deep vein thrombosis)  COPD with chronic bronchitis  Polymyalgia rheumatica- off steroids since 2012  OSA on CPAP  CKD (chronic kidney disease) stage 3, GFR 30-59 ml/min  Atrial fibrillation with controlled ventricular response  Chronic systolic CHF (congestive heart failure), NYHA class 3- EF 25-30%  Chronic anticoagulation  Hypothyroidism  Pyelonephritis  Fever  Chronic diastolic heart failure, NYHA class 2  Acute renal failure  Flash pulmonary edema  Acute respiratory failure with hypoxia      Medication List     As of 02/17/2012  9:09 PM    STOP taking these medications         losartan 50 MG tablet   Commonly known as: COZAAR      naproxen sodium 220 MG tablet   Commonly known as: ANAPROX      TAKE these medications         allopurinol 100 MG tablet   Commonly known as: ZYLOPRIM   Take 100 mg by mouth daily.      amLODipine 5 MG tablet   Commonly known as: NORVASC   Take 1 tablet by mouth daily.      anastrozole 1 MG tablet   Commonly known as: ARIMIDEX   Take 1 mg by mouth daily.      BIOTIN PO   Take 1 tablet by mouth daily.      brimonidine 0.15 % ophthalmic solution   Commonly known as: ALPHAGAN   Place 1 drop into both eyes daily.      buPROPion 150 MG 12 hr tablet   Commonly known as: WELLBUTRIN SR   TAKE 1 TABLET BY MOUTH EVERY DAY      CALCIUM 500 1250 MG tablet   Generic drug: calcium carbonate   Take 1 tablet by mouth daily.      carvedilol 12.5 MG tablet   Commonly known as: COREG   Take 1-2 tablets (12.5-25 mg total) by mouth 2 (two) times daily with a meal.  Taking 2 tablets in the morning and 1 at night      clorazepate 7.5 MG tablet   Commonly known as: TRANXENE   Take 7.5 mg by mouth 2 (two) times daily as needed. For anxiety      doxazosin 2 MG tablet   Commonly known as: CARDURA   Take 1 tablet (2 mg total) by mouth at bedtime.      Fluticasone-Salmeterol 100-50 MCG/DOSE Aepb   Commonly known as: ADVAIR   Inhale 1 puff into the lungs every 12 (twelve) hours.      levothyroxine 137 MCG tablet   Commonly known as: SYNTHROID, LEVOTHROID   Take 137 mcg by mouth daily.      LORazepam 1 MG tablet   Commonly known as: ATIVAN   Take 1 mg by mouth every 8 (eight) hours as needed.      omeprazole 40 MG capsule   Commonly known as: PRILOSEC   Take 40 mg by mouth daily.      prednisoLONE acetate 1 % ophthalmic suspension   Commonly known as: PRED FORTE   Place 1 drop into both eyes daily as needed. For eye pain  timolol 0.5 % ophthalmic solution   Commonly known as: TIMOPTIC   Place 1 drop into both eyes 2 (two) times daily. Twice daily      torsemide 20 MG tablet   Commonly known as: DEMADEX   Take 20 mg by mouth every other day.      traMADol 50 MG tablet   Commonly known as: ULTRAM   Take 50 mg by mouth at bedtime as needed. For sleep and pain      vitamin D (CHOLECALCIFEROL) 400 UNITS tablet   Take 400 Units by mouth daily.      warfarin 1 MG tablet   Commonly known as: COUMADIN   Take 2-3 mg by mouth as directed. Take 3 mg on Mondays, Wednesdays, and Fridays. Take 2 mg on Sunday, Tuesdays, Thursdays, and Saturdays.         Disposition and Follow-up:  PCP in 1 week  Significant Diagnostic Studies:  No results found.  Brief H and P: Wendy Cole is a 76 y.o. female with past medical history of diabetes mellitus, systolic/diastolic heart failure and stage III chronic kidney disease who presents to the emergency room after having one week of dysuria and today started having significant back pain to the  point where she could not take it anymore and came into the emergency room.  In the emergency room, she was noted to have a very large urinary tract infection, acute on chronic renal failure and a white blood cell count of 16.2. Patient was given a dose of IV Rocephin, started on IV fluids and hospitalists were called for admission and treatment  Hospital Course:   Pyelonephritis  Cont empiric abx - culture reveals E. Coli- pt improving on Rocephin , transitioned to oral ciprofloxacin  Acute on Chronic kidney disease stage IV  Baseline creatinine in August 2013 was noted to be approximately 1.5-1.9 - patient's creatinine has been steadily climbing since her admission - stopped ARB/ACE -   IVFs resulted in pulmonary edema and therefore were held - renal US reveals previous left renal cyst is slightly larger  Creatinine improved close to baseline at 2.0 at discharge  Acute hypoxic respiratory distress  Most consistent with acute pulmonary edema - improved after diuresis of approximately 1 L - pt now refusing to take lasix or bumex -at this point does not require further diuresis.   Diabetes mellitus type 2  Reasonable controlled at present - follow trend and cont SSI   Hyperlipidemia  Resume home tx regimen   COPD with chronic bronchitis  Well compensated at present time   Chronic systolic and diastolic congestive heart failure - NICM  Ejection fraction 20-25% via echocardiogram February 2013 - patient desires CHF education and f/u in CHF clinic  Paroxysmal Atrial fibrillation  On chronic anticoagulation - rate presently controlled   Obstructive sleep apnea  Resume home CPAP regimen   History of DVT  Anticoagulated (followed by Dr. Truett Perna per pt report)   History of hyperthyroidism status post partial thyroidectomy with resultant hypothyroidism  TSH at goal in April 2013   Time spent on Discharge:  Signed: Jamiesha Victoria [facility]  02/17/2012, 9:09  PM

## 2012-02-25 ENCOUNTER — Ambulatory Visit (INDEPENDENT_AMBULATORY_CARE_PROVIDER_SITE_OTHER): Payer: Medicare Other | Admitting: Cardiovascular Disease

## 2012-02-25 ENCOUNTER — Encounter: Payer: Self-pay | Admitting: Cardiovascular Disease

## 2012-02-25 VITALS — BP 139/60 | HR 60 | Wt 218.0 lb

## 2012-02-25 DIAGNOSIS — I5022 Chronic systolic (congestive) heart failure: Secondary | ICD-10-CM

## 2012-02-25 DIAGNOSIS — I6523 Occlusion and stenosis of bilateral carotid arteries: Secondary | ICD-10-CM

## 2012-02-25 DIAGNOSIS — I658 Occlusion and stenosis of other precerebral arteries: Secondary | ICD-10-CM

## 2012-02-25 DIAGNOSIS — I10 Essential (primary) hypertension: Secondary | ICD-10-CM

## 2012-02-25 DIAGNOSIS — E1169 Type 2 diabetes mellitus with other specified complication: Secondary | ICD-10-CM

## 2012-02-25 DIAGNOSIS — I4891 Unspecified atrial fibrillation: Secondary | ICD-10-CM

## 2012-02-25 NOTE — Progress Notes (Signed)
[name], female   DOB: 1935/07/14, 76 y.o.   MRN: [id number] Wendy Cole is a 76 y/o F with hx of NICM, PAF, OSA, HTN, prior DVT, renal insufficiency who presented to [facility] 11/18/11 for planned L carotid endarterectomy. Carotid duplex on 11/10/11 showed bilateral 80-99% ICA stenosis and she is post bilateral CEA;s  She is normally seen by Dr [place] and saw Dr Tenny Craw in consultation at the hospital 6/26 for post op hypertension. She had a heart cath 07/13/11   showing mild nonobstructive disease with normal right heart and left ventricular filling pressures. Her echo showed an EF of 25 to 30%. She will be managed medically.  Last echo 2/18  Study Conclusions  - Left ventricle: Systolic function was severely reduced. The estimated ejection fraction was in the range of 25% to 30%. Features are consistent with a pseudonormal left ventricular filling pattern, with concomitant abnormal relaxation and increased filling pressure (grade 2 diastolic dysfunction). Doppler parameters are consistent with elevated mean left atrial filling pressure. - Regional wall motion abnormality: Akinesis of the basal-mid anteroseptal myocardium; hypokinesis of the entire anterior, mid inferolateral, basal-mid anterolateral, and apical lateral myocardium. - Aortic valve: Mild regurgitation. - Mitral valve: Mild regurgitation. - Left atrium: The atrium was moderately dilated. - Right atrium: The atrium was mildly dilated. - Pulmonary arteries: PA peak pressure: 49mm Hg (S). Impressions:  - The right ventricular systolic pressure was increased consistent with mild pulmonary hypertension.  Post op has some pain in left neck and headache. Has not been taking higher dose of coreg  Renal duplex normal 7/13    ROS: Denies fever, malais, weight loss, blurry vision, decreased visual acuity, cough, sputum, SOB, hemoptysis, pleuritic pain, palpitaitons, heartburn, abdominal pain, melena, lower  extremity edema, claudication, or rash.  All other systems reviewed and negative  General: Affect appropriate Healthy:  appears stated age HEENT: normal Neck supple with no adenopathy JVP normal no bruits no thyromegaly Lungs clear with no wheezing and good diaphragmatic motion Heart:  S1/S2 no murmur, no rub, gallop or click PMI normal Abdomen: benighn, BS positve, no tenderness, no AAA no bruit.  No HSM or HJR Distal pulses intact with no bruits No edema Neuro non-focal Skin warm and dry No muscular weakness   Current Outpatient Prescriptions  Medication Sig Dispense Refill  . allopurinol (ZYLOPRIM) 100 MG tablet Take 100 mg by mouth daily.      [name] anastrozole (ARIMIDEX) 1 MG tablet Take 1 mg by mouth daily.      . brimonidine (ALPHAGAN) 0.15 % ophthalmic solution Place 1 drop into both eyes daily.       [name] buPROPion (WELLBUTRIN SR) 150 MG 12 hr tablet TAKE 1 TABLET BY MOUTH EVERY DAY  30 tablet  PRN  . calcium carbonate (CALCIUM 500) 1250 MG tablet Take 1 tablet by mouth daily.      . carvedilol (COREG) 12.5 MG tablet Taking 2 tablets in the morning and 1 at night      . clorazepate (TRANXENE) 7.5 MG tablet Take 7.5 mg by mouth 2 (two) times daily as needed. For anxiety      . ferrous sulfate 325 (65 FE) MG tablet Take 325 mg by mouth daily with breakfast.      . Fluticasone-Salmeterol (ADVAIR) 100-50 MCG/DOSE AEPB Inhale 1 puff into the lungs every 12 (twelve) hours.      [name] levothyroxine (SYNTHROID, LEVOTHROID) 137 MCG tablet Take 137 mcg by mouth daily.      [name]  LORazepam (ATIVAN) 1 MG tablet Take 1 mg by mouth every 8 (eight) hours as needed.      [name] losartan (COZAAR) 50 MG tablet Take 50 mg by mouth daily.      [name] omeprazole (PRILOSEC) 40 MG capsule Take 40 mg by mouth daily.       . prednisoLONE acetate (PRED FORTE) 1 % ophthalmic suspension Place 1 drop into both eyes daily as needed. For eye pain      . timolol (TIMOPTIC) 0.5 % ophthalmic solution Place 1 drop into both eyes 2  (two) times daily. Twice daily      . torsemide (DEMADEX) 20 MG tablet Take 20 mg by mouth every other day.      . traMADol (ULTRAM) 50 MG tablet Take 50 mg by mouth at bedtime as needed. For sleep and pain      . vitamin D, CHOLECALCIFEROL, 400 UNITS tablet Take 400 Units by mouth daily.       [name] warfarin (COUMADIN) 1 MG tablet Take 2-3 mg by mouth as directed. Take 3 mg on Mondays, Wednesdays, and Fridays. Take 2 mg on Sunday, Tuesdays, Thursdays, and Saturdays.        Allergies  Amlodipine; Atorvastatin; Colesevelam; Lasix; Statins; and Tape  Electrocardiogram:  9/14  NSR rate 90 ? Old IMI  Assessment and Plan

## 2012-02-25 NOTE — Assessment & Plan Note (Signed)
Euvolemic Dry weight 213  Continue current dose lasix  Echo in 6 months

## 2012-02-25 NOTE — Patient Instructions (Signed)
Your physician wants you to follow-up in:   6 MONTHS WITH  DR NISHAN  ECHO SAME DAY  You will receive a reminder letter in the mail two months in advance. If you don't receive a letter, please call our office to schedule the follow-up appointment. Your physician recommends that you continue on your current medications as directed. Please refer to the Current Medication list given to you today. Your physician has requested that you have an echocardiogram. Echocardiography is a painless test that uses sound waves to create images of your heart. It provides your doctor with information about the size and shape of your heart and how well your heart's chambers and valves are working. This procedure takes approximately one hour. There are no restrictions for this procedure.  

## 2012-02-25 NOTE — Assessment & Plan Note (Signed)
Well controlled.  Continue current medications and low sodium Dash type diet.    

## 2012-02-25 NOTE — Assessment & Plan Note (Signed)
Maint NSR continue coumadin  

## 2012-02-25 NOTE — Assessment & Plan Note (Signed)
S/P bilateral CEA  F/U Dr Imogene Burn

## 2012-02-26 ENCOUNTER — Other Ambulatory Visit: Payer: Self-pay | Admitting: *Deleted

## 2012-02-26 ENCOUNTER — Ambulatory Visit: Payer: Medicare Other | Admitting: Oncology

## 2012-02-26 ENCOUNTER — Other Ambulatory Visit: Payer: Self-pay | Admitting: Medical Oncology

## 2012-02-26 ENCOUNTER — Telehealth: Payer: Self-pay | Admitting: Medical Oncology

## 2012-02-26 ENCOUNTER — Other Ambulatory Visit (HOSPITAL_BASED_OUTPATIENT_CLINIC_OR_DEPARTMENT_OTHER): Payer: Medicare Other

## 2012-02-26 DIAGNOSIS — Z86718 Personal history of other venous thrombosis and embolism: Secondary | ICD-10-CM

## 2012-02-26 DIAGNOSIS — Z7901 Long term (current) use of anticoagulants: Secondary | ICD-10-CM

## 2012-02-26 DIAGNOSIS — I82409 Acute embolism and thrombosis of unspecified deep veins of unspecified lower extremity: Secondary | ICD-10-CM

## 2012-02-26 LAB — PROTIME-INR

## 2012-02-26 MED ORDER — CARVEDILOL 12.5 MG PO TABS: ORAL_TABLET | ORAL | Status: DC

## 2012-02-26 NOTE — Telephone Encounter (Signed)
Spoke to pt and told her to take Coumadin 3 mg on M-W-F and 2 mg the other days. She voices understanding.

## 2012-02-29 ENCOUNTER — Telehealth: Payer: Self-pay | Admitting: Oncology

## 2012-02-29 NOTE — Telephone Encounter (Signed)
l/m with lab appt that was addef to the 10/21 md visit    aom

## 2012-03-02 ENCOUNTER — Other Ambulatory Visit: Payer: Self-pay | Admitting: Nurse Practitioner

## 2012-03-02 ENCOUNTER — Other Ambulatory Visit: Payer: Self-pay | Admitting: *Deleted

## 2012-03-02 MED ORDER — TORSEMIDE 20 MG PO TABS: 20.0000 mg | ORAL_TABLET | ORAL | Status: DC

## 2012-03-02 NOTE — Telephone Encounter (Signed)
Refilled Torsemide

## 2012-03-08 ENCOUNTER — Ambulatory Visit: Payer: Medicare Other | Admitting: Oncology

## 2012-03-14 ENCOUNTER — Other Ambulatory Visit (HOSPITAL_BASED_OUTPATIENT_CLINIC_OR_DEPARTMENT_OTHER): Payer: Medicare Other | Admitting: Lab

## 2012-03-14 ENCOUNTER — Encounter: Payer: Self-pay | Admitting: Oncology

## 2012-03-14 ENCOUNTER — Ambulatory Visit (HOSPITAL_BASED_OUTPATIENT_CLINIC_OR_DEPARTMENT_OTHER): Payer: Medicare Other | Admitting: Oncology

## 2012-03-14 ENCOUNTER — Telehealth: Payer: Self-pay | Admitting: Oncology

## 2012-03-14 VITALS — BP 121/50 | HR 63 | Temp 97.0°F | Resp 20 | Ht 64.0 in | Wt 221.0 lb

## 2012-03-14 DIAGNOSIS — C50319 Malignant neoplasm of lower-inner quadrant of unspecified female breast: Secondary | ICD-10-CM

## 2012-03-14 DIAGNOSIS — Z7901 Long term (current) use of anticoagulants: Secondary | ICD-10-CM

## 2012-03-14 DIAGNOSIS — C50519 Malignant neoplasm of lower-outer quadrant of unspecified female breast: Secondary | ICD-10-CM

## 2012-03-14 DIAGNOSIS — Z86718 Personal history of other venous thrombosis and embolism: Secondary | ICD-10-CM

## 2012-03-14 LAB — PROTIME-INR
INR: 1.8 — ABNORMAL LOW (ref 2.00–3.50)
Protime: 21.6 Seconds — ABNORMAL HIGH (ref 10.6–13.4)

## 2012-03-14 NOTE — Telephone Encounter (Signed)
appts made and printed for pt pt states pt-inr should be monthly,no orders for that,inbox to dr gbs for orders and i will call the pt   aom

## 2012-03-14 NOTE — Progress Notes (Signed)
   [facility]    OFFICE PROGRESS NOTE   INTERVAL HISTORY:   She returns as scheduled. She continues Coumadin anticoagulation. No sign of recurrent venous thrombosis. No change at the right chest wall. She continues anastrozole. A left mammogram was negative 03/01/2012. She recently underwent bilateral carotid surgery.  Objective:  Vital signs in last 24 hours:  Blood pressure 121/50, pulse 63, temperature 97 F (36.1 C), resp. rate 20, height 5\' 4"  (1.626 m), weight 221 lb (100.245 kg).    HEENT: Neck without mass Lymphatics: No cervical, supraclavicular, or axillary nodes Resp: Lungs clear bilaterally Cardio: Regular rate and rhythm GI: No hepatomegaly Vascular: The left lower leg is slightly larger than the right side, no edema  Breast: Status post right mastectomy. No evidence for chest wall tumor recurrence. Left breast without mass.     Lab Results: PT/INR 1.8  Medications: I have reviewed the patient's current medications.  Assessment/Plan: 1. Extensive left lower extremity deep vein thrombosis. Maintained on anticoagulation therapy since she was diagnosed with a deep venous thrombosis, 01/31/2009.  a. She was treated with Lovenox followed by heparin anticoagulation while hospitalized, and she was then maintained on Lovenox prior to surgery on 03/02/2009.  b. Now maintained on therapeutic Coumadin anticoagulation.  2. Diagnosed with synchronous T1, ER positive, PR positive, and HER-2 negative right-sided breast cancer on 01/09/2009. a. Status post a right mastectomy and sentinel lymph node biopsy on 03/22/2009 with the pathology confirming two foci of invasive carcinoma measuring 0.6 and 0.4 cm. The invasive tumor was grade 1 with associated low-grade ductal carcinoma in situ. No lymphovascular invasion was identified, and three right-sided sentinel lymph nodes were negative for metastatic carcinoma.  b. Initiation of adjuvant Arimidex on  04/05/2009. 3. Chronic obstructive pulmonary disease. 4. History of a cerebrovascular accident. 5. Gout. 6. Obstructive sleep apnea. 7. History of renal insufficiency. 8. Mild anemia-potentially related to chronic renal insufficiency, we will check a CBC when she returns in 2-3 months for a PT check  9. Admission with "flash "pulmonary edema in February of 2013. 10. Left carotid endarterectomy June 2013, right carotid endarterectomy August 2013   Disposition:  She remains in clinical remission from breast cancer. She will continue anastrozole. She continues Coumadin anticoagulation. We continue to monitor the prothrombin time each month.  Wendy Cole will return for an office visit in 6 months.   Thornton Papas, MD  03/14/2012  12:55 PM

## 2012-03-15 ENCOUNTER — Encounter: Payer: Self-pay | Admitting: Internal Medicine

## 2012-03-15 ENCOUNTER — Ambulatory Visit (INDEPENDENT_AMBULATORY_CARE_PROVIDER_SITE_OTHER): Payer: Medicare Other | Admitting: Internal Medicine

## 2012-03-15 VITALS — BP 102/62 | HR 55 | Ht 64.0 in | Wt 221.4 lb

## 2012-03-15 DIAGNOSIS — Z9989 Dependence on other enabling machines and devices: Secondary | ICD-10-CM

## 2012-03-15 DIAGNOSIS — Z86718 Personal history of other venous thrombosis and embolism: Secondary | ICD-10-CM

## 2012-03-15 DIAGNOSIS — G4733 Obstructive sleep apnea (adult) (pediatric): Secondary | ICD-10-CM

## 2012-03-15 DIAGNOSIS — J4489 Other specified chronic obstructive pulmonary disease: Secondary | ICD-10-CM

## 2012-03-15 DIAGNOSIS — J449 Chronic obstructive pulmonary disease, unspecified: Secondary | ICD-10-CM

## 2012-03-15 NOTE — Patient Instructions (Addendum)
We can continue CPAP 10/ Advanced  Please call as needed  Keep on walking- it will help your breathing

## 2012-03-15 NOTE — Progress Notes (Signed)
Subjective:    [name], female    DOB: 01-27-1936, 76 y.o.   MRN: [id number]  HPI 10/17/10- 76 yoF former smoker followed for OSA and COPD, complicated by DM, hx breast cancer, hx DVT. Here with husband. Last here Oct 17, 2009- note reviewed.  She denies any significant breathing issues since last here. Continues to use CPAP at 10 all night every night. Never recurrence of DVT left leg.  Noted minor cough when first lying down- they are not concerned.   10/22/11- 76 yoF former smoker followed for OSA and COPD, complicated by DM, hx breast cancer, hx DVT.... Here with husband. Denies any SOB, wheezing, cough, or congestion. Wears CPAP every night for 5-6 hours approx. Stable dyspnea on exertion with hills and stairs. Hospitalized from February 18 through 26 with pulmonary edema, history AFib, chronic Coumadin, history of DVT. Ejection fraction 25-30%.  01/29/12- 76 yoF former smoker followed for OSA and COPD, complicated by DM, hx breast cancer, hx DVT.... Here with husband. ACUTE VISIT: Increased SOB and worse at night; get raspy breathing prior to SOB starting up. This episode made her feel like her stomach was going to pop. Hospital 8/20 through 01/13/2012-right carotid endarterectomy/Dr. Imogene Burn. Occasional episodes all summer described as onset of shortness of breath in the evening after dinner when she is reading or watching TV or it she starts clearing her throat and feels bloated in the abdomen which makes her more short of breath. Worse if lying down. Denies fever, purulent discharge, palpitation or chest pain. 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 CPAP all night every night. She has been mis- using Advair, saving it for her episodes of dyspnea, so she uses it once at night with 2 or 3 activations. I reeducated her on use as a maintenance inhaler.  03/15/12- 76 yoF former smoker followed for OSA and  COPD, complicated by DM, hx breast cancer, hx DVT.... Here with husband.  Pt states that SOB has improved since last OV. No complaints She feels well and is walking more. Recognizes fluid retention causes shortness of breath. Her diuretic therapy helps. Pleased with CPAP 10/Advanced used all night every night. CXR 02/02/12-reviewed with her IMPRESSION:  Cardiomegaly. COPD/chronic changes. No active disease.  Original Report Authenticated By: Cyndie Chime, M.D.    ROS-see HPI Constitutional:   No-   weight loss, night sweats, fevers, chills, fatigue, lassitude. HEENT:   No-  headaches, difficulty swallowing, tooth/dental problems, sore throat,       No-  sneezing, itching, ear ache, nasal congestion, post nasal drip,  CV:  No-   chest pain, orthopnea, PND, swelling in lower extremities, anasarca, dizziness, palpitations Resp: +shortness of breath with exertion or at rest.              No-   productive cough,  No non-productive cough,  No- coughing up of blood.              No-   change in color of mucus.  No- wheezing.   Skin: No-   rash or lesions. GI:  No-   heartburn, indigestion, abdominal pain, nausea, vomiting,  GU:  MS:  No-   joint pain or swelling.   Neuro-     nothing unusual Psych:  No- change in mood or affect. No depression or anxiety.  No memory loss.  OBJ- Physical Exam General- Alert, Oriented, Affect-appropriate, Distress- none acute,  overweight Skin- rash-none, lesions- none, excoriation- none Lymphadenopathy- none Head- atraumatic            Eyes- Gross vision intact, PERRLA, conjunctivae and secretions clear            Ears- Hearing, canals-normal            Nose- Clear, no-Septal dev, mucus, polyps, erosion, perforation             Throat- Mallampati II , mucosa clear , drainage- none, tonsils- atrophic Neck- flexible , trachea midline, no stridor , thyroid nl, + R carotid scar Chest - symmetrical excursion , unlabored           Heart/CV- RRR , +2/6 systolic  murmur , no gallop  , no rub, nl s1 s2                           - JVD- none , edema- none, stasis changes- none, varices- none           Lung- clear to P&A, wheeze- none, cough- none , dullness-none, rub- none           Chest wall-  Abd-  Br/ Gen/ Rectal- Not done, not indicated Extrem- cyanosis- none, clubbing, none, atrophy- none, strength- nl Neuro- grossly intact to observation

## 2012-03-17 ENCOUNTER — Telehealth: Payer: Self-pay | Admitting: Oncology

## 2012-03-17 ENCOUNTER — Telehealth: Payer: Self-pay | Admitting: *Deleted

## 2012-03-17 DIAGNOSIS — Z86718 Personal history of other venous thrombosis and embolism: Secondary | ICD-10-CM

## 2012-03-17 NOTE — Telephone Encounter (Signed)
Called pt and left message for monthly labs starting December 2013 until April 2014 when she see MD

## 2012-03-17 NOTE — Telephone Encounter (Signed)
Spoke with pt, she confirmed Coumadin dose. Voiced understanding to recheck PT INR next month. They reported she was not scheduled for monthly labs after November. POF sent.

## 2012-03-17 NOTE — Telephone Encounter (Signed)
Message copied by Caleb Popp on Thu Mar 17, 2012  8:58 AM ------      Message from: Thornton Papas B      Created: Wed Mar 16, 2012  8:49 PM       Please call patient, same coumadin, check PT in 1 month

## 2012-03-23 ENCOUNTER — Ambulatory Visit: Payer: Medicare Other | Admitting: Internal Medicine

## 2012-03-25 NOTE — Assessment & Plan Note (Signed)
Reinforce education to avoid stagnation of blood flow, elevate legs or keep moving

## 2012-03-25 NOTE — Assessment & Plan Note (Signed)
Good compliance and control. No changes required today.

## 2012-03-25 NOTE — Assessment & Plan Note (Signed)
She relates days when she is short of breath to fluid retention rather than weather or season change.

## 2012-04-08 ENCOUNTER — Other Ambulatory Visit (HOSPITAL_BASED_OUTPATIENT_CLINIC_OR_DEPARTMENT_OTHER): Payer: Medicare Other

## 2012-04-08 DIAGNOSIS — Z86718 Personal history of other venous thrombosis and embolism: Secondary | ICD-10-CM

## 2012-04-08 LAB — PROTIME-INR: Protime: 20.4 Seconds — ABNORMAL HIGH (ref 10.6–13.4)

## 2012-04-10 ENCOUNTER — Other Ambulatory Visit: Payer: Self-pay | Admitting: Oncology

## 2012-04-10 DIAGNOSIS — C50519 Malignant neoplasm of lower-outer quadrant of unspecified female breast: Secondary | ICD-10-CM

## 2012-04-13 ENCOUNTER — Other Ambulatory Visit: Payer: Self-pay | Admitting: Oncology

## 2012-04-13 ENCOUNTER — Telehealth: Payer: Self-pay | Admitting: *Deleted

## 2012-04-13 ENCOUNTER — Encounter: Payer: Self-pay | Admitting: Internal Medicine

## 2012-04-13 ENCOUNTER — Ambulatory Visit (INDEPENDENT_AMBULATORY_CARE_PROVIDER_SITE_OTHER): Payer: Medicare Other | Admitting: Internal Medicine

## 2012-04-13 VITALS — BP 110/62 | HR 76 | Temp 97.0°F | Resp 16 | Wt 223.0 lb

## 2012-04-13 DIAGNOSIS — M545 Low back pain, unspecified: Secondary | ICD-10-CM

## 2012-04-13 DIAGNOSIS — E1169 Type 2 diabetes mellitus with other specified complication: Secondary | ICD-10-CM

## 2012-04-13 DIAGNOSIS — Z86718 Personal history of other venous thrombosis and embolism: Secondary | ICD-10-CM

## 2012-04-13 DIAGNOSIS — E1159 Type 2 diabetes mellitus with other circulatory complications: Secondary | ICD-10-CM

## 2012-04-13 DIAGNOSIS — I6523 Occlusion and stenosis of bilateral carotid arteries: Secondary | ICD-10-CM

## 2012-04-13 DIAGNOSIS — N12 Tubulo-interstitial nephritis, not specified as acute or chronic: Secondary | ICD-10-CM

## 2012-04-13 DIAGNOSIS — I10 Essential (primary) hypertension: Secondary | ICD-10-CM

## 2012-04-13 DIAGNOSIS — I658 Occlusion and stenosis of other precerebral arteries: Secondary | ICD-10-CM

## 2012-04-13 MED ORDER — TRAMADOL HCL 50 MG PO TABS: 50.0000 mg | ORAL_TABLET | Freq: Every evening | ORAL | Status: DC | PRN

## 2012-04-13 NOTE — Assessment & Plan Note (Signed)
LS x ray  UA  Tramadol prn

## 2012-04-13 NOTE — Telephone Encounter (Signed)
Message copied by Wandalee Ferdinand on Wed Apr 13, 2012  1:34 PM ------      Message from: Thornton Papas B      Created: Fri Apr 08, 2012  6:29 PM       Please call patient, same coumadin, check PT in 1 week and if the INR is still low we will increase the coumadin dose

## 2012-04-13 NOTE — Telephone Encounter (Signed)
Left VM to continue same coumadin, but call to schedule recheck on 11/22 this week. Order to scheduling department.

## 2012-04-13 NOTE — Progress Notes (Signed)
[name], female   DOB: 05-16-1936, 76 y.o.   MRN: [id number]   Subjective:    [name], female    DOB: 1936-03-08, 76 y.o.   MRN: [id number]  HPI   F/u L carotid repair The patient presents for a follow-up of  chronic hypertension, chronic dyslipidemia, type 2 diabetes, h/o CHF controlled with medicines   Wt Readings from Last 3 Encounters:  04/13/12 223 lb (101.152 kg)  03/15/12 221 lb 6.4 oz (100.426 kg)  03/14/12 221 lb (100.245 kg)   BP Readings from Last 3 Encounters:  04/13/12 110/62  03/15/12 102/62  03/14/12 121/50        Review of Systems  Constitutional: Negative for chills, activity change, appetite change, fatigue and unexpected weight change.  HENT: Negative for congestion, mouth sores and sinus pressure.   Eyes: Negative for visual disturbance.  Respiratory: Negative for cough and chest tightness.   Gastrointestinal: Negative for nausea and abdominal pain.  Genitourinary: Negative for frequency, difficulty urinating and vaginal pain.  Musculoskeletal: Positive for back pain, arthralgias and gait problem. Negative for joint swelling.  Skin: Negative for pallor and rash.  Neurological: Negative for dizziness, tremors, weakness, numbness and headaches.  Psychiatric/Behavioral: Negative for suicidal ideas, confusion and sleep disturbance.        Objective:   Physical Exam  Constitutional: She appears well-developed. No distress.  HENT:  Head: Normocephalic.  Right Ear: External ear normal.  Left Ear: External ear normal.  Nose: Nose normal.  Mouth/Throat: Oropharynx is clear and moist.  Eyes: Conjunctivae normal are normal. Pupils are equal, round, and reactive to light. Right eye exhibits no discharge. Left eye exhibits no discharge.  Neck: Normal range of motion. Neck supple. No JVD present. No tracheal deviation present. No thyromegaly present.  Cardiovascular: Normal rate, regular rhythm and normal heart  sounds.   Pulmonary/Chest: No stridor. No respiratory distress. She has no wheezes.  Abdominal: Soft. Bowel sounds are normal. She exhibits no distension and no mass. There is no tenderness. There is no rebound and no guarding.  Musculoskeletal: She exhibits no edema and no tenderness.  Lymphadenopathy:    She has no cervical adenopathy.  Neurological: She displays normal reflexes. No cranial nerve deficit. She exhibits normal muscle tone. Coordination normal.       cane  Skin: No rash noted. No erythema.  Psychiatric: She has a normal mood and affect. Her behavior is normal. Judgment and thought content normal.    Lab Results  Component Value Date   WBC 8.6 02/04/2012   HGB 9.6* 02/04/2012   HCT 30.9* 02/04/2012   PLT 260 02/04/2012   GLUCOSE 116* 02/04/2012   CHOL 221* 09/09/2011   TRIG 286.0* 09/09/2011   HDL 32.10* 09/09/2011   LDLDIRECT 144.6 09/09/2011   LDLCALC  Value: 154        Total Cholesterol/HDL:CHD Risk Coronary Heart Disease Risk Table                     Men   Women  1/2 Average Risk   3.4   3.3  Average Risk       5.0   4.4  2 X Average Risk   9.6   7.1  3 X Average Risk  23.4   11.0        Use the calculated Patient Ratio above and the CHD Risk Table to determine the patient's CHD Risk.  ATP III CLASSIFICATION (LDL):  <100     mg/dL   Optimal  161-096  mg/dL   Near or Above                    Optimal  130-159  mg/dL   Borderline  045-409  mg/dL   High  >811     mg/dL   Very High* 02/06/7828   ALT 12 02/03/2012   AST 20 02/03/2012   NA 138 02/04/2012   K 3.9 02/04/2012   CL 104 02/04/2012   CREATININE 2.15* 02/04/2012   BUN 37* 02/04/2012   CO2 24 02/04/2012   TSH 0.92 09/09/2011   INR 1.70* 04/08/2012   HGBA1C 6.3 12/09/2011   MICROALBUR 3.1* 09/27/2006          Assessment & Plan:

## 2012-04-14 ENCOUNTER — Other Ambulatory Visit (INDEPENDENT_AMBULATORY_CARE_PROVIDER_SITE_OTHER): Payer: Medicare Other

## 2012-04-14 ENCOUNTER — Telehealth: Payer: Self-pay | Admitting: Internal Medicine

## 2012-04-14 ENCOUNTER — Ambulatory Visit (INDEPENDENT_AMBULATORY_CARE_PROVIDER_SITE_OTHER)
Admission: RE | Admit: 2012-04-14 | Discharge: 2012-04-14 | Disposition: A | Discharge status: Home / Self Care | Payer: Medicare Other | Source: Ambulatory Visit | Attending: Internal Medicine | Admitting: Internal Medicine

## 2012-04-14 DIAGNOSIS — M545 Low back pain: Secondary | ICD-10-CM

## 2012-04-14 DIAGNOSIS — N12 Tubulo-interstitial nephritis, not specified as acute or chronic: Secondary | ICD-10-CM

## 2012-04-14 LAB — URINALYSIS, ROUTINE W REFLEX MICROSCOPIC
Bilirubin Urine: NEGATIVE
Ketones, ur: NEGATIVE
Total Protein, Urine: 100
Urine Glucose: NEGATIVE
pH: 6 (ref 5.0–8.0)

## 2012-04-14 MED ORDER — CIPROFLOXACIN HCL 500 MG PO TABS: 500.0000 mg | ORAL_TABLET | Freq: Two times a day (BID) | ORAL | Status: DC

## 2012-04-14 NOTE — Assessment & Plan Note (Signed)
Continue with current prescription therapy as reflected on the Med list.  

## 2012-04-14 NOTE — Telephone Encounter (Signed)
Patient picked up on 11/18, confirmed current Rx with 1 additional refill

## 2012-04-14 NOTE — Telephone Encounter (Signed)
Wendy Cole, please, inform patient that she has a UTI - take abx Cipro x10 d. The X ray was ok - no new changes.

## 2012-04-14 NOTE — Assessment & Plan Note (Signed)
Continue with current prescription therapy as reflected on the Med list. Doing well 

## 2012-04-14 NOTE — Assessment & Plan Note (Signed)
No relapse 

## 2012-04-14 NOTE — Assessment & Plan Note (Signed)
Start Cipro 

## 2012-04-14 NOTE — Assessment & Plan Note (Signed)
Cipro

## 2012-04-15 ENCOUNTER — Other Ambulatory Visit: Payer: Self-pay | Admitting: *Deleted

## 2012-04-15 ENCOUNTER — Other Ambulatory Visit (HOSPITAL_BASED_OUTPATIENT_CLINIC_OR_DEPARTMENT_OTHER): Payer: Medicare Other

## 2012-04-15 ENCOUNTER — Telehealth: Payer: Self-pay | Admitting: *Deleted

## 2012-04-15 DIAGNOSIS — Z86718 Personal history of other venous thrombosis and embolism: Secondary | ICD-10-CM

## 2012-04-15 LAB — PROTIME-INR: INR: 2.2 (ref 2.00–3.50)

## 2012-04-15 NOTE — Telephone Encounter (Signed)
Pt informed

## 2012-04-15 NOTE — Telephone Encounter (Signed)
Left VM to continue same coumadin-see Doc Flowsheet

## 2012-04-26 ENCOUNTER — Ambulatory Visit: Payer: Medicare Other | Admitting: Internal Medicine

## 2012-04-29 ENCOUNTER — Ambulatory Visit: Payer: Medicare Other | Admitting: Vascular Surgery

## 2012-05-02 ENCOUNTER — Other Ambulatory Visit: Payer: Self-pay | Admitting: Internal Medicine

## 2012-05-03 ENCOUNTER — Encounter (HOSPITAL_COMMUNITY): Payer: Self-pay | Admitting: *Deleted

## 2012-05-03 ENCOUNTER — Inpatient Hospital Stay (HOSPITAL_COMMUNITY)
Admission: EM | Admit: 2012-05-03 | Discharge: 2012-05-05 | DRG: 292 | Disposition: A | Discharge status: Home / Self Care | Payer: Medicare Other | Attending: Emergency Medicine | Admitting: Emergency Medicine

## 2012-05-03 ENCOUNTER — Emergency Department (HOSPITAL_COMMUNITY): Payer: Medicare Other

## 2012-05-03 DIAGNOSIS — E119 Type 2 diabetes mellitus without complications: Secondary | ICD-10-CM | POA: Diagnosis present

## 2012-05-03 DIAGNOSIS — Z79899 Other long term (current) drug therapy: Secondary | ICD-10-CM

## 2012-05-03 DIAGNOSIS — C50519 Malignant neoplasm of lower-outer quadrant of unspecified female breast: Secondary | ICD-10-CM

## 2012-05-03 DIAGNOSIS — Z853 Personal history of malignant neoplasm of breast: Secondary | ICD-10-CM

## 2012-05-03 DIAGNOSIS — K219 Gastro-esophageal reflux disease without esophagitis: Secondary | ICD-10-CM | POA: Diagnosis present

## 2012-05-03 DIAGNOSIS — Z9010 Acquired absence of unspecified breast and nipple: Secondary | ICD-10-CM

## 2012-05-03 DIAGNOSIS — I5023 Acute on chronic systolic (congestive) heart failure: Secondary | ICD-10-CM

## 2012-05-03 DIAGNOSIS — Z8673 Personal history of transient ischemic attack (TIA), and cerebral infarction without residual deficits: Secondary | ICD-10-CM

## 2012-05-03 DIAGNOSIS — M1990 Unspecified osteoarthritis, unspecified site: Secondary | ICD-10-CM | POA: Diagnosis present

## 2012-05-03 DIAGNOSIS — I129 Hypertensive chronic kidney disease with stage 1 through stage 4 chronic kidney disease, or unspecified chronic kidney disease: Secondary | ICD-10-CM | POA: Diagnosis present

## 2012-05-03 DIAGNOSIS — I509 Heart failure, unspecified: Secondary | ICD-10-CM | POA: Diagnosis present

## 2012-05-03 DIAGNOSIS — J810 Acute pulmonary edema: Secondary | ICD-10-CM

## 2012-05-03 DIAGNOSIS — I480 Paroxysmal atrial fibrillation: Secondary | ICD-10-CM | POA: Diagnosis present

## 2012-05-03 DIAGNOSIS — E785 Hyperlipidemia, unspecified: Secondary | ICD-10-CM | POA: Diagnosis present

## 2012-05-03 DIAGNOSIS — F329 Major depressive disorder, single episode, unspecified: Secondary | ICD-10-CM | POA: Diagnosis present

## 2012-05-03 DIAGNOSIS — I4891 Unspecified atrial fibrillation: Secondary | ICD-10-CM | POA: Diagnosis present

## 2012-05-03 DIAGNOSIS — I10 Essential (primary) hypertension: Secondary | ICD-10-CM | POA: Diagnosis present

## 2012-05-03 DIAGNOSIS — N179 Acute kidney failure, unspecified: Secondary | ICD-10-CM | POA: Diagnosis present

## 2012-05-03 DIAGNOSIS — I428 Other cardiomyopathies: Secondary | ICD-10-CM | POA: Diagnosis present

## 2012-05-03 DIAGNOSIS — Z23 Encounter for immunization: Secondary | ICD-10-CM

## 2012-05-03 DIAGNOSIS — Z87891 Personal history of nicotine dependence: Secondary | ICD-10-CM

## 2012-05-03 DIAGNOSIS — I5043 Acute on chronic combined systolic (congestive) and diastolic (congestive) heart failure: Principal | ICD-10-CM | POA: Diagnosis present

## 2012-05-03 DIAGNOSIS — N184 Chronic kidney disease, stage 4 (severe): Secondary | ICD-10-CM | POA: Diagnosis present

## 2012-05-03 DIAGNOSIS — Z9989 Dependence on other enabling machines and devices: Secondary | ICD-10-CM | POA: Diagnosis present

## 2012-05-03 DIAGNOSIS — E1159 Type 2 diabetes mellitus with other circulatory complications: Secondary | ICD-10-CM | POA: Diagnosis present

## 2012-05-03 DIAGNOSIS — Z86718 Personal history of other venous thrombosis and embolism: Secondary | ICD-10-CM

## 2012-05-03 DIAGNOSIS — F3289 Other specified depressive episodes: Secondary | ICD-10-CM | POA: Diagnosis present

## 2012-05-03 DIAGNOSIS — I252 Old myocardial infarction: Secondary | ICD-10-CM

## 2012-05-03 DIAGNOSIS — Z7901 Long term (current) use of anticoagulants: Secondary | ICD-10-CM

## 2012-05-03 DIAGNOSIS — E039 Hypothyroidism, unspecified: Secondary | ICD-10-CM | POA: Diagnosis present

## 2012-05-03 DIAGNOSIS — M109 Gout, unspecified: Secondary | ICD-10-CM | POA: Diagnosis present

## 2012-05-03 DIAGNOSIS — E669 Obesity, unspecified: Secondary | ICD-10-CM | POA: Diagnosis present

## 2012-05-03 DIAGNOSIS — F411 Generalized anxiety disorder: Secondary | ICD-10-CM | POA: Diagnosis present

## 2012-05-03 DIAGNOSIS — G4733 Obstructive sleep apnea (adult) (pediatric): Secondary | ICD-10-CM | POA: Diagnosis present

## 2012-05-03 LAB — COMPREHENSIVE METABOLIC PANEL
ALT: 14 U/L (ref 0–35)
AST: 21 U/L (ref 0–37)
Albumin: 3.1 g/dL — ABNORMAL LOW (ref 3.5–5.2)
Alkaline Phosphatase: 91 U/L (ref 39–117)
Alkaline Phosphatase: 88 U/L (ref 39–117)
BUN: 22 mg/dL (ref 6–23)
BUN: 21 mg/dL (ref 6–23)
CO2: 21 mEq/L (ref 19–32)
Calcium: 8.9 mg/dL (ref 8.4–10.5)
Chloride: 103 mEq/L (ref 96–112)
Chloride: 103 mEq/L (ref 96–112)
Creatinine, Ser: 1.59 mg/dL — ABNORMAL HIGH (ref 0.50–1.10)
Creatinine, Ser: 1.67 mg/dL — ABNORMAL HIGH (ref 0.50–1.10)
GFR calc Af Amer: 36 mL/min — ABNORMAL LOW (ref >90)
GFR calc Af Amer: 34 mL/min — ABNORMAL LOW (ref >90)
GFR calc non Af Amer: 29 mL/min — ABNORMAL LOW (ref >90)
Glucose, Bld: 176 mg/dL — ABNORMAL HIGH (ref 70–99)
Glucose, Bld: 106 mg/dL — ABNORMAL HIGH (ref 70–99)
Potassium: 4.6 mEq/L (ref 3.5–5.1)
Potassium: 5 mEq/L (ref 3.5–5.1)
Sodium: 136 mEq/L (ref 135–145)
Total Bilirubin: 0.3 mg/dL (ref 0.3–1.2)
Total Bilirubin: 0.2 mg/dL — ABNORMAL LOW (ref 0.3–1.2)
Total Protein: 6.7 g/dL (ref 6.0–8.3)
Total Protein: 6.3 g/dL (ref 6.0–8.3)

## 2012-05-03 LAB — CBC WITH DIFFERENTIAL/PLATELET
Eosinophils Absolute: 0.4 10*3/uL (ref 0.0–0.7)
HCT: 30.5 % — ABNORMAL LOW (ref 36.0–46.0)
Hemoglobin: 9.4 g/dL — ABNORMAL LOW (ref 12.0–15.0)
Lymphs Abs: 0.6 10*3/uL — ABNORMAL LOW (ref 0.7–4.0)
MCH: 24.9 pg — ABNORMAL LOW (ref 26.0–34.0)
MCV: 80.9 fL (ref 78.0–100.0)
Monocytes Absolute: 0.5 10*3/uL (ref 0.1–1.0)
Monocytes Relative: 4 % (ref 3–12)
Neutrophils Relative %: 87 % — ABNORMAL HIGH (ref 43–77)
RBC: 3.77 MIL/uL — ABNORMAL LOW (ref 3.87–5.11)

## 2012-05-03 LAB — URINALYSIS, MICROSCOPIC ONLY
Bilirubin Urine: NEGATIVE
Glucose, UA: NEGATIVE mg/dL
Ketones, ur: NEGATIVE mg/dL
pH: 5.5 (ref 5.0–8.0)

## 2012-05-03 LAB — POCT I-STAT 3, ART BLOOD GAS (G3+): pH, Arterial: 7.361 (ref 7.350–7.450)

## 2012-05-03 LAB — PRO B NATRIURETIC PEPTIDE: Pro B Natriuretic peptide (BNP): 3006 pg/mL — ABNORMAL HIGH (ref 0–125)

## 2012-05-03 LAB — GLUCOSE, CAPILLARY: Glucose-Capillary: 102 mg/dL — ABNORMAL HIGH (ref 70–99)

## 2012-05-03 MED ORDER — FUROSEMIDE 10 MG/ML IJ SOLN: 40.0000 mg | Freq: Two times a day (BID) | INTRAMUSCULAR | Status: DC

## 2012-05-03 MED ORDER — WARFARIN - PHARMACIST DOSING INPATIENT: Freq: Every day | Status: DC

## 2012-05-03 MED ORDER — TORSEMIDE 20 MG PO TABS
20.0000 mg | ORAL_TABLET | Freq: Every day | ORAL | Status: DC
  Filled 2012-05-03: qty 1

## 2012-05-03 MED ORDER — ONDANSETRON HCL 4 MG/2ML IJ SOLN: 4.0000 mg | Freq: Four times a day (QID) | INTRAMUSCULAR | Status: DC | PRN

## 2012-05-03 MED ORDER — BRIMONIDINE TARTRATE 0.2 % OP SOLN
1.0000 [drp] | Freq: Every day | OPHTHALMIC | Status: DC
  Administered 2012-05-03 – 2012-05-05 (×3): 1 [drp] via OPHTHALMIC
  Filled 2012-05-03: qty 5

## 2012-05-03 MED ORDER — TIMOLOL MALEATE 0.5 % OP SOLN
1.0000 [drp] | Freq: Two times a day (BID) | OPHTHALMIC | Status: DC
  Administered 2012-05-03 – 2012-05-05 (×5): 1 [drp] via OPHTHALMIC
  Filled 2012-05-03: qty 5

## 2012-05-03 MED ORDER — BUPROPION HCL ER (SR) 150 MG PO TB12
150.0000 mg | ORAL_TABLET | Freq: Two times a day (BID) | ORAL | Status: DC
Start: 2012-05-03 — End: 2012-05-05
  Administered 2012-05-03 – 2012-05-05 (×5): 150 mg via ORAL
  Filled 2012-05-03 (×6): qty 1

## 2012-05-03 MED ORDER — FERROUS SULFATE 325 (65 FE) MG PO TABS
325.0000 mg | ORAL_TABLET | Freq: Every day | ORAL | Status: DC
  Administered 2012-05-03 – 2012-05-05 (×3): 325 mg via ORAL
  Filled 2012-05-03 (×5): qty 1

## 2012-05-03 MED ORDER — TORSEMIDE 20 MG PO TABS
20.0000 mg | ORAL_TABLET | Freq: Two times a day (BID) | ORAL | Status: DC
  Administered 2012-05-03 (×2): 20 mg via ORAL
  Filled 2012-05-03 (×4): qty 1

## 2012-05-03 MED ORDER — CALCIUM CARBONATE 1250 (500 CA) MG PO TABS
1.0000 | ORAL_TABLET | Freq: Every day | ORAL | Status: DC
  Administered 2012-05-03 – 2012-05-05 (×3): 500 mg via ORAL
  Filled 2012-05-03 (×3): qty 1

## 2012-05-03 MED ORDER — NITROGLYCERIN IN D5W 200-5 MCG/ML-% IV SOLN: 2.0000 ug/min | INTRAVENOUS | Status: DC

## 2012-05-03 MED ORDER — CHOLECALCIFEROL 10 MCG (400 UNIT) PO TABS
400.0000 [IU] | ORAL_TABLET | Freq: Every day | ORAL | Status: DC
  Administered 2012-05-03 – 2012-05-05 (×3): 400 [IU] via ORAL
  Filled 2012-05-03 (×3): qty 1

## 2012-05-03 MED ORDER — HYDRALAZINE HCL 25 MG PO TABS
25.0000 mg | ORAL_TABLET | Freq: Three times a day (TID) | ORAL | Status: DC
  Administered 2012-05-03 – 2012-05-05 (×6): 25 mg via ORAL
  Filled 2012-05-03 (×9): qty 1

## 2012-05-03 MED ORDER — ALLOPURINOL 100 MG PO TABS
100.0000 mg | ORAL_TABLET | Freq: Every day | ORAL | Status: DC
  Administered 2012-05-03 – 2012-05-05 (×3): 100 mg via ORAL
  Filled 2012-05-03 (×3): qty 1

## 2012-05-03 MED ORDER — SODIUM CHLORIDE 0.9 % IJ SOLN
3.0000 mL | Freq: Two times a day (BID) | INTRAMUSCULAR | Status: DC
  Administered 2012-05-04 – 2012-05-05 (×2): 3 mL via INTRAVENOUS

## 2012-05-03 MED ORDER — WARFARIN SODIUM 4 MG PO TABS
4.0000 mg | ORAL_TABLET | Freq: Once | ORAL | Status: AC
  Administered 2012-05-03: 4 mg via ORAL
  Filled 2012-05-03: qty 1

## 2012-05-03 MED ORDER — LORAZEPAM 1 MG PO TABS: 1.0000 mg | ORAL_TABLET | Freq: Four times a day (QID) | ORAL | Status: DC | PRN

## 2012-05-03 MED ORDER — MOMETASONE FURO-FORMOTEROL FUM 100-5 MCG/ACT IN AERO
2.0000 | INHALATION_SPRAY | Freq: Two times a day (BID) | RESPIRATORY_TRACT | Status: DC
  Administered 2012-05-03 – 2012-05-05 (×4): 2 via RESPIRATORY_TRACT
  Filled 2012-05-03 (×2): qty 8.8

## 2012-05-03 MED ORDER — INSULIN ASPART 100 UNIT/ML ~~LOC~~ SOLN
0.0000 [IU] | Freq: Three times a day (TID) | SUBCUTANEOUS | Status: DC
  Administered 2012-05-04 – 2012-05-05 (×2): 1 [IU] via SUBCUTANEOUS

## 2012-05-03 MED ORDER — SODIUM CHLORIDE 0.9 % IV SOLN: 250.0000 mL | INTRAVENOUS | Status: DC | PRN

## 2012-05-03 MED ORDER — TRAMADOL HCL 50 MG PO TABS
50.0000 mg | ORAL_TABLET | Freq: Every evening | ORAL | Status: DC | PRN
  Administered 2012-05-04: 50 mg via ORAL
  Filled 2012-05-03 (×2): qty 1

## 2012-05-03 MED ORDER — PANTOPRAZOLE SODIUM 40 MG PO TBEC
40.0000 mg | DELAYED_RELEASE_TABLET | Freq: Every day | ORAL | Status: DC
  Administered 2012-05-03 – 2012-05-05 (×3): 40 mg via ORAL
  Filled 2012-05-03 (×2): qty 1

## 2012-05-03 MED ORDER — NITROGLYCERIN IN D5W 200-5 MCG/ML-% IV SOLN
5.0000 ug/min | INTRAVENOUS | Status: DC
  Administered 2012-05-03 (×2): 5 ug/min via INTRAVENOUS
  Filled 2012-05-03: qty 250

## 2012-05-03 MED ORDER — LEVOTHYROXINE SODIUM 137 MCG PO TABS
137.0000 ug | ORAL_TABLET | Freq: Every day | ORAL | Status: DC
  Administered 2012-05-03 – 2012-05-05 (×3): 137 ug via ORAL
  Filled 2012-05-03 (×3): qty 1

## 2012-05-03 MED ORDER — TORSEMIDE 20 MG/2ML IV SOLN
20.0000 mg | Freq: Two times a day (BID) | INTRAVENOUS | Status: DC
  Filled 2012-05-03 (×2): qty 2

## 2012-05-03 MED ORDER — ANASTROZOLE 1 MG PO TABS
1.0000 mg | ORAL_TABLET | Freq: Every day | ORAL | Status: DC
  Administered 2012-05-03 – 2012-05-05 (×3): 1 mg via ORAL
  Filled 2012-05-03 (×3): qty 1

## 2012-05-03 MED ORDER — BRIMONIDINE TARTRATE 0.15 % OP SOLN
1.0000 [drp] | Freq: Every day | OPHTHALMIC | Status: DC
  Filled 2012-05-03: qty 5

## 2012-05-03 MED ORDER — ACETAMINOPHEN 325 MG PO TABS: 650.0000 mg | ORAL_TABLET | ORAL | Status: DC | PRN

## 2012-05-03 MED ORDER — SODIUM CHLORIDE 0.9 % IJ SOLN: 3.0000 mL | INTRAMUSCULAR | Status: DC | PRN

## 2012-05-03 MED ORDER — CARVEDILOL 12.5 MG PO TABS
12.5000 mg | ORAL_TABLET | Freq: Two times a day (BID) | ORAL | Status: DC
  Administered 2012-05-03 (×2): 12.5 mg via ORAL
  Filled 2012-05-03 (×5): qty 1

## 2012-05-03 NOTE — ED Provider Notes (Signed)
History     CSN: [id number]  Arrival date & time 05/03/12  0145   First MD Initiated Contact with Patient 05/03/12 0150      Chief Complaint  Patient presents with  . Shortness of Breath    (Consider location/radiation/quality/duration/timing/severity/associated sxs/prior treatment) Patient is a 76 y.o. female presenting with shortness of breath. The history is provided by the patient and the EMS personnel.  Shortness of Breath  The current episode started today. The onset was sudden. The problem occurs continuously. The problem has been rapidly worsening. The problem is severe. Nothing relieves the symptoms. The symptoms are aggravated by a supine position and activity. Associated symptoms include orthopnea, cough and shortness of breath. Pertinent negatives include no chest pain, no chest pressure, no fever and no wheezing. She has had intermittent steroid use. She has had prior hospitalizations. Her past medical history is significant for asthma. Past medical history comments: prior hx of flash pulmonary edema. The last void occurred less than 6 hours ago. There were no sick contacts. Recently, medical care has been given by EMS. Services received include medications given (NTG, cpap).    Past Medical History  Diagnosis Date  . Anemia   . Anxiety states   . Depressive disorder, not elsewhere classified   . Type II or unspecified type diabetes mellitus without mention of complication, not stated as uncontrolled   . Hyperlipidemia   . Unspecified essential hypertension   . Renal insufficiency   . Osteoarthritis   . Gout   . Iritis   . Nonischemic cardiomyopathy     EF 20 to 25% per echo 06/2011  . Chronic anticoagulation   . NSTEMI (non-ST elevated myocardial infarction) Feb 2013    06/2011 cath  Mild nonobstructive disease. No LV gram  . PAF (paroxysmal atrial fibrillation)   . Obesity   . Cancer 2010    Right breast  . DVT (deep venous thrombosis) 2010  . Stroke 2004   Left upper lobe  . Pulmonary edema 2013  . Thyroid disease 1969    Three fourth of Thyroid removed  . Complication of anesthesia     "fights it", for colonoscopy  . Flash pulmonary edema     06/2011  . HOH (hard of hearing)     deaf in L completely  . OSA on CPAP     CPAP at night  . GERD (gastroesophageal reflux disease)   . Hypothyroidism     Past Surgical History  Procedure Date  . Breast lumpectomy   . Cataract extraction   . Foot surgery   . Tonsillectomy 1949  . Abdominal hysterectomy 1974  . Eye surgery 2008    Glaucoma shunt Right eye  . Parotid gland tumor excision 2000  . Cystectomy 1974    Left Breast, chin, left of groin area  . Thyroidectomy, partial 1969  . Endarterectomy 11/18/2011    Procedure: ENDARTERECTOMY CAROTID;  Surgeon: Fransisco Hertz, MD;  Location: [facility] OR;  Service: Vascular;  Laterality: Left;  [name] Mastectomy right  . Mastectomy   . Cardiac catheterization     no stent  . Colonoscopy w/ polypectomy   . Endarterectomy 01/12/2012    Procedure: ENDARTERECTOMY CAROTID;  Surgeon: Fransisco Hertz, MD;  Location: [facility] OR;  Service: Vascular;  Laterality: Right;  Right carotid endarterectomy with 1cm x 6cm vascuguard patch angioplasty.    Family History  Problem Relation Age of Onset  . Hypertension    . Heart disease Mother 49  CHF  . Cancer Mother     breast  . Heart disease Father 37  . Heart disease Brother     aaa    History  Substance Use Topics  . Smoking status: Former Smoker -- 2.0 packs/day for 40 years    Types: Cigarettes    Quit date: 05/25/2002  . Smokeless tobacco: Never Used  . Alcohol Use: No    OB History    Grav Para Term Preterm Abortions TAB SAB Ect Mult Living                  Review of Systems  Constitutional: Negative for fever.  Respiratory: Positive for cough and shortness of breath. Negative for wheezing.   Cardiovascular: Positive for orthopnea. Negative for chest pain.  All other systems reviewed and are  negative.    Allergies  Amlodipine; Atorvastatin; Colesevelam; Lasix; Statins; and Tape  Home Medications   Current Outpatient Rx  Name  Route  Sig  Dispense  Refill  . ALLOPURINOL 100 MG PO TABS   Oral   Take 100 mg by mouth daily.         . ANASTROZOLE 1 MG PO TABS      TAKE 1 TABLET DAILY   90 tablet   1   . BRIMONIDINE TARTRATE 0.15 % OP SOLN   Both Eyes   Place 1 drop into both eyes daily.          . BUPROPION HCL ER (SR) 150 MG PO TB12      TAKE 1 TABLET BY MOUTH EVERY DAY   30 tablet   PRN   . CALCIUM CARBONATE 1250 MG PO TABS   Oral   Take 1 tablet by mouth daily.         [name] CARVEDILOL 12.5 MG PO TABS      Taking 2 tablets in the morning and 1 at night   90 tablet   5   . CIPROFLOXACIN HCL 500 MG PO TABS   Oral   Take 1 tablet (500 mg total) by mouth 2 (two) times daily.   20 tablet   0   . CLORAZEPATE DIPOTASSIUM 7.5 MG PO TABS   Oral   Take 7.5 mg by mouth 2 (two) times daily as needed. For anxiety         . FERROUS SULFATE 325 (65 FE) MG PO TABS   Oral   Take 325 mg by mouth daily with breakfast.         . FLUTICASONE-SALMETEROL 100-50 MCG/DOSE IN AEPB   Inhalation   Inhale 1 puff into the lungs every 12 (twelve) hours.         [name] LEVOTHYROXINE SODIUM 137 MCG PO TABS   Oral   Take 137 mcg by mouth daily.         [name] LORAZEPAM 1 MG PO TABS   Oral   Take 1 mg by mouth every 8 (eight) hours as needed.         [name] OMEPRAZOLE 40 MG PO CPDR   Oral   Take 40 mg by mouth daily.          [name] PREDNISOLONE ACETATE 1 % OP SUSP   Both Eyes   Place 1 drop into both eyes daily as needed. For eye pain         . TIMOLOL MALEATE 0.5 % OP SOLN   Both Eyes   Place 1 drop into both eyes 2 (two) times daily.  Twice daily         . TORSEMIDE 20 MG PO TABS               . TRAMADOL HCL 50 MG PO TABS   Oral   Take 1 tablet (50 mg total) by mouth at bedtime as needed. For sleep and pain   60 tablet   3   . VITAMIN D 400 UNITS PO  TABS   Oral   Take 400 Units by mouth daily.          . WARFARIN SODIUM 1 MG PO TABS   Oral   Take 2-3 mg by mouth as directed. Take 3 mg on Mondays, Wednesdays, and Fridays. Take 2 mg on Sunday, Tuesdays, Thursdays, and Saturdays.           BP 150/51  Pulse 72  Temp 97.1 F (36.2 C) (Axillary)  Resp 24  SpO2 97%  Physical Exam  Nursing note and vitals reviewed. Constitutional: She is oriented to person, place, and time. She appears well-developed and well-nourished. No distress.  HENT:  Head: Normocephalic and atraumatic.  Mouth/Throat: Oropharynx is clear and moist.  Eyes: Conjunctivae normal and EOM are normal. Pupils are equal, round, and reactive to light.  Neck: Normal range of motion. Neck supple.  Cardiovascular: Normal rate, regular rhythm and intact distal pulses.   No murmur heard. Pulmonary/Chest: Tachypnea noted. No respiratory distress. She has wheezes. She has rales in the right middle field, the right lower field, the left middle field and the left lower field.  Abdominal: Soft. She exhibits no distension. There is no tenderness. There is no rebound and no guarding.  Musculoskeletal: Normal range of motion. She exhibits edema. She exhibits no tenderness.  Neurological: She is alert and oriented to person, place, and time.  Skin: Skin is warm and dry. No rash noted. No erythema.  Psychiatric: She has a normal mood and affect. Her behavior is normal.    ED Course  Procedures (including critical care time)  Labs Reviewed  CBC WITH DIFFERENTIAL - Abnormal; Notable for the following:    WBC 11.9 (*)     RBC 3.77 (*)     Hemoglobin 9.4 (*)     HCT 30.5 (*)     MCH 24.9 (*)     Neutrophils Relative 87 (*)     Neutro Abs 10.4 (*)     Lymphocytes Relative 5 (*)     Lymphs Abs 0.6 (*)     All other components within normal limits  COMPREHENSIVE METABOLIC PANEL - Abnormal; Notable for the following:    Glucose, Bld 176 (*)     Creatinine, Ser 1.59 (*)      Albumin 3.2 (*)     GFR calc non Af Amer 31 (*)     GFR calc Af Amer 36 (*)     All other components within normal limits  PRO B NATRIURETIC PEPTIDE - Abnormal; Notable for the following:    Pro B Natriuretic peptide (BNP) 3006.0 (*)     All other components within normal limits  PROTIME-INR - Abnormal; Notable for the following:    Prothrombin Time 21.0 (*)     INR 1.89 (*)     All other components within normal limits  TROPONIN I  URINALYSIS, MICROSCOPIC ONLY   Dg Chest Port 1 View  05/03/2012  *RADIOLOGY REPORT*  Clinical Data: Shortness of breath  PORTABLE CHEST - 1 VIEW  Comparison: 02/02/2012  Findings: Cardiac enlargement  with mild pulmonary vascular congestion and interstitial edema.  Changes are similar to previous studies.  No developing focal consolidation.  No blunting of costophrenic angles.  No pneumothorax.  Calcification of the aorta.  IMPRESSION: Cardiac enlargement with pulmonary vascular congestion and interstitial edema similar to previous study.   Original Report Authenticated By: Burman Nieves, M.D.     Date: 05/03/2012  Rate: 71  Rhythm: normal sinus rhythm  QRS Axis: normal  Intervals: normal  ST/T Wave abnormalities: nonspecific ST changes  Conduction Disutrbances:none  Narrative Interpretation:   Old EKG Reviewed: unchanged  CRITICAL CARE Performed by: Gwyneth Sprout   Total critical care time: 30  Critical care time was exclusive of separately billable procedures and treating other patients.  Critical care was necessary to treat or prevent imminent or life-threatening deterioration.  Critical care was time spent personally by me on the following activities: development of treatment plan with patient and/or surrogate as well as nursing, discussions with consultants, evaluation of patient's response to treatment, examination of patient, obtaining history from patient or surrogate, ordering and performing treatments and interventions, ordering and  review of laboratory studies, ordering and review of radiographic studies, pulse oximetry and re-evaluation of patient's condition.   1. Flash pulmonary edema       MDM   Patient presented in what seems to be flash pulmonary edema. She states she was fine today when she went to lay down and read her book tonight she became acutely short of breath. When EMS arrived her blood pressure was 240 systolic satting 29%. Patient was given nitroglycerin and started on CPAP with vast improvement in her symptoms. She is allergic to Lasix but does have a history of flash pulmonary edema in the past. However also patient has a history of asthma and takes Advair and history of DVT in the past currently on Coumadin. CPAP was removed and patient was still wheezing with tachypnea and BiPAP was placed. Patient is able to speak in short sentences while BiPAP is on and satting in the low 90s. CBC, CMP, BNP, troponin, EKG, portable chest, ABG pending. Patient's blood pressure here is vastly improved but will start a nitroglycerin drip.  3:04 AM Troponin negative. BNP is stable. Creatinine is stable at 1.5. Spoke with cardiology and they will admit patient for further care appear      Gwyneth Sprout, MD 05/03/12 [phone]

## 2012-05-03 NOTE — Telephone Encounter (Signed)
Ok to Rf? 

## 2012-05-03 NOTE — ED Notes (Signed)
Pt resting on stretcher, remains on bipap, admission orders now in computer

## 2012-05-03 NOTE — ED Notes (Signed)
Pt in via EMS, pt in stating she developed shortness of breath this evening, had normal cough this afternoon, denies chest pain, history of flash pulmonary edema. Pt O2 level was 86% upon EMS arrival, given nitro SL x2 and was started on CPAP. Upon arrival to ED pt states symptoms had improved, some shortness of breath still noted. Pt placed on bipap upon arrival to ED. RT at bedside upon arrival.

## 2012-05-03 NOTE — H&P (Signed)
CARDIOLOGY ADMISSION NOTE  [name] MRN: [id number] DOB/AGE: [birth year] 74 y.o.  Admit date: 05/03/2012 Primary Physician   Sonda Primes, MD Primary Cardiologist   Dr. Eden Emms Chief Complaint    Shortness of breath  HPI:  The patient reports that she was in her usual state of health until she tried to go to bed tonight.  About 10 minutes after going to bed the patient developed acute shortness of breath. This was similar to her presentation in February for flash pulmonary edema.  At that time she was found to have an EF of 25%.  Cath at that  demonstrated mild nonobstructive disease. However, she has been feeling well from a cardiovascular standpoint since that time without any obvious exacerbation of her heart failure. She only takes her diuretic as needed.  She does not watch her salt. In fact she eats out frequently. In the emergency room she was found to have a proBN of 3006. Chest x-ray demonstrated acute pulmonary edema. The patient denies any chest pressure, neck or arm discomfort. She doesn't weigh herself daily but doesn't think she's gained weight or edema. She's not had any palpitations presyncope or syncope. She does limited daily activities and hasn't had any difficulty doing this. She does have chronic renal insufficiency with a creat of 1.59.     Past Medical History  Diagnosis Date  . Anemia   . Anxiety states   . Depressive disorder, not elsewhere classified   . Type II or unspecified type diabetes mellitus without mention of complication, not stated as uncontrolled   . Hyperlipidemia   . Unspecified essential hypertension   . Renal insufficiency   . Osteoarthritis   . Gout   . Iritis   . Nonischemic cardiomyopathy     EF 20 to 25% per echo 06/2011  . Chronic anticoagulation   . NSTEMI (non-ST elevated myocardial infarction) Feb 2013    06/2011 cath  Mild nonobstructive disease. No LV gram  . PAF (paroxysmal atrial fibrillation)   . Obesity     . Cancer 2010    Right breast  . DVT (deep venous thrombosis) 2010  . Stroke 2004    Left upper lobe  . Pulmonary edema 2013  . Thyroid disease 1969    Three fourth of Thyroid removed  . Complication of anesthesia     "fights it", for colonoscopy  . Flash pulmonary edema     06/2011  . HOH (hard of hearing)     deaf in L completely  . OSA on CPAP     CPAP at night  . GERD (gastroesophageal reflux disease)   . Hypothyroidism     Past Surgical History  Procedure Date  . Breast lumpectomy   . Cataract extraction   . Foot surgery   . Tonsillectomy 1949  . Abdominal hysterectomy 1974  . Eye surgery 2008    Glaucoma shunt Right eye  . Parotid gland tumor excision 2000  . Cystectomy 1974    Left Breast, chin, left of groin area  . Thyroidectomy, partial 1969  . Endarterectomy 11/18/2011    Procedure: ENDARTERECTOMY CAROTID;  Surgeon: Fransisco Hertz, MD;  Location: [facility] OR;  Service: Vascular;  Laterality: Left;  [name] Mastectomy right  . Mastectomy   . Cardiac catheterization     no stent  . Colonoscopy w/ polypectomy   . Endarterectomy 01/12/2012    Procedure: ENDARTERECTOMY CAROTID;  Surgeon: Fransisco Hertz, MD;  Location: [facility] OR;  Service:  Vascular;  Laterality: Right;  Right carotid endarterectomy with 1cm x 6cm vascuguard patch angioplasty.    Allergies  Allergen Reactions  . Amlodipine     edema  . Atorvastatin Other (See Comments)    Muscle aches  . Colesevelam Other (See Comments)    unknown  . Lasix (Furosemide) Other (See Comments)    Muscle cramps  . Statins Other (See Comments)    Muscle aches  . Tape Rash   No current facility-administered medications on file prior to encounter.   Current Outpatient Prescriptions on File Prior to Encounter  Medication Sig Dispense Refill  . allopurinol (ZYLOPRIM) 100 MG tablet Take 100 mg by mouth daily.      [name] anastrozole (ARIMIDEX) 1 MG tablet TAKE 1 TABLET DAILY  90 tablet  1  . brimonidine (ALPHAGAN) 0.15 % ophthalmic  solution Place 1 drop into both eyes daily.       [name] buPROPion (WELLBUTRIN SR) 150 MG 12 hr tablet TAKE 1 TABLET BY MOUTH EVERY DAY  30 tablet  PRN  . calcium carbonate (CALCIUM 500) 1250 MG tablet Take 1 tablet by mouth daily.      . clorazepate (TRANXENE) 7.5 MG tablet Take 7.5 mg by mouth 2 (two) times daily as needed. For anxiety      . ferrous sulfate 325 (65 FE) MG tablet Take 325 mg by mouth daily with breakfast.      . Fluticasone-Salmeterol (ADVAIR) 100-50 MCG/DOSE AEPB Inhale 1 puff into the lungs every 12 (twelve) hours.      [name] levothyroxine (SYNTHROID, LEVOTHROID) 137 MCG tablet Take 137 mcg by mouth daily.      [name] LORazepam (ATIVAN) 1 MG tablet Take 1 mg by mouth every 8 (eight) hours as needed. For anxiety      . omeprazole (PRILOSEC) 40 MG capsule Take 40 mg by mouth daily.       . prednisoLONE acetate (PRED FORTE) 1 % ophthalmic suspension Place 1 drop into both eyes daily as needed. For eye pain      . timolol (TIMOPTIC) 0.5 % ophthalmic solution Place 1 drop into both eyes 2 (two) times daily. Twice daily      . torsemide (DEMADEX) 20 MG tablet Take 20 mg by mouth daily.       . traMADol (ULTRAM) 50 MG tablet Take 1 tablet (50 mg total) by mouth at bedtime as needed. For sleep and pain  60 tablet  3  . vitamin D, CHOLECALCIFEROL, 400 UNITS tablet Take 400 Units by mouth daily.       [name] warfarin (COUMADIN) 1 MG tablet Take 2-3 mg by mouth as directed. Take 3 mg on Mondays, Wednesdays, and Fridays. Take 2 mg on Sunday, Tuesdays, Thursdays, and Saturdays.      . ciprofloxacin (CIPRO) 500 MG tablet Take 1 tablet (500 mg total) by mouth 2 (two) times daily.  20 tablet  0   History   Social History  . Marital Status: Married    Spouse Name: N/A    Number of Children: N/A  . Years of Education: N/A   Occupational History  . Retired    Social History Main Topics  . Smoking status: Former Smoker -- 2.0 packs/day for 40 years    Types: Cigarettes    Quit date: 05/25/2002  .  Smokeless tobacco: Never Used  . Alcohol Use: No  . Drug Use: No  . Sexually Active: Not Currently   Other Topics Concern  . Not on file  Social History Narrative  . No narrative on file    Family History  Problem Relation Age of Onset  . Hypertension    . Heart disease Mother 70    CHF  . Cancer Mother     breast  . Heart disease Father 80  . Heart disease Brother     aaa    ROS:   As stated in the HPI and negative for all other systems.  Physical Exam: Blood pressure 141/46, pulse 65, temperature 97.1 F (36.2 C), temperature source Axillary, resp. rate 23, SpO2 99.00%.  GENERAL:  Well appearing on BiPAP HEENT:  Pupils equal round and reactive, fundi not visualized, oral mucosa unremarkable NECK:  30 degrees jugular venous distention, waveform within normal limits, carotid upstroke brisk and symmetric, no bruits, no thyromegaly, healed right CEA scar LYMPHATICS:  No cervical, inguinal adenopathy LUNGS:  Clear to auscultation bilaterally BACK:  No CVA tenderness CHEST:  Unremarkable HEART:  PMI not displaced or sustained,S1 and S2 within normal limits, no S3, no S4, no clicks, no rubs, no murmurs, obese ABD:  Flat, positive bowel sounds normal in frequency in pitch, no bruits, no rebound, no guarding, no midline pulsatile mass, no hepatomegaly, no splenomegaly EXT:  2 plus pulses throughout, trace edema, no cyanosis no clubbing SKIN:  No rashes no nodules NEURO:  Cranial nerves II through XII grossly intact, motor grossly intact throughout PSYCH:  Cognitively intact, oriented to person place and time  Labs: Lab Results  Component Value Date   BUN 22 05/03/2012   Lab Results  Component Value Date   CREATININE 1.59* 05/03/2012   Lab Results  Component Value Date   NA 135 05/03/2012   K 4.6 05/03/2012   CL 103 05/03/2012   CO2 19 05/03/2012   Lab Results  Component Value Date   TROPONINI <0.30 05/03/2012   Lab Results  Component Value Date   WBC 11.9*  05/03/2012   HGB 9.4* 05/03/2012   HCT 30.5* 05/03/2012   MCV 80.9 05/03/2012   PLT 347 05/03/2012    Lab Results  Component Value Date   ALT 14 05/03/2012   AST 22 05/03/2012   ALKPHOS 91 05/03/2012   BILITOT 0.3 05/03/2012      Radiology:  CXR:  Cardiac enlargement with pulmonary vascular congestion and  interstitial edema similar to previous study.   EKG:  Sinus rhythm rate 71, axis within normal limits, intervals within normal limits, no acute ST-T wave changes. 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 She's allergic to Lasix and so she will get IV Demadex. For now she'll remain on the BiPAP. We'll have to diurese her carefully given her renal insufficiency. He needs a significant amount of education about salt and dietary restrictions. She should have daily weights at home.    CKD:  Her creatinine currently is at baseline. We will follow closely.  PAF:  She is maintaining sinus rhythm. No change in therapy is indicated. She's on chronic anticoagulation for this and other indications.  She's currently subtherapeutic on her Coumadin.  Diabetes:  She will continue her home regimen.  Sleep apnea: She uses CPAP at night.    HTN:  She has allergies to several medications.  She reports that her BP is always elevated and difficult to control.  She is not an an ACE inhibitor  apparently because of CKD.  She will be on IV NTG for now and should be considered for NTG/hydralazine at discharge.  I will continue her Coreg.    SignedRollene Rotunda 05/03/2012, 3:04 AM

## 2012-05-03 NOTE — Progress Notes (Signed)
SUBJECTIVE:  Breathing much better off of hydralazine   PHYSICAL EXAM Filed Vitals:   05/03/12 0600 05/03/12 0611 05/03/12 0700 05/03/12 0800  BP: 173/44  171/48 176/50  Pulse: 61  63 66  Temp: 99.6 F (37.6 C)     TempSrc:      Resp: 24  21 20   Height:  5\' 4"  (1.626 m)    Weight:  219 lb 9.3 oz (99.6 kg)    SpO2: 100%  96% 97%   General:  No distress Lungs:  Improved breath sounds. Mild crackles Heart:  RRR Abdomen:  Positive bowel sounds, no rebound no guarding Extremities:  No edema  LABS: Lab Results  Component Value Date   CKTOTAL 60 07/14/2011   CKMB 6.4* 07/14/2011   TROPONINI <0.30 05/03/2012   Results for orders placed during the hospital encounter of 05/03/12 (from the past 24 hour(s))  CBC WITH DIFFERENTIAL     Status: Abnormal   Collection Time   05/03/12  2:09 AM      Component Value Range   WBC 11.9 (*) 4.0 - 10.5 K/uL   RBC 3.77 (*) 3.87 - 5.11 MIL/uL   Hemoglobin 9.4 (*) 12.0 - 15.0 g/dL   HCT 16.1 (*) 09.6 - 04.5 %   MCV 80.9  78.0 - 100.0 fL   MCH 24.9 (*) 26.0 - 34.0 pg   MCHC 30.8  30.0 - 36.0 g/dL   RDW 40.9  81.1 - 91.4 %   Platelets 347  150 - 400 K/uL   Neutrophils Relative 87 (*) 43 - 77 %   Neutro Abs 10.4 (*) 1.7 - 7.7 K/uL   Lymphocytes Relative 5 (*) 12 - 46 %   Lymphs Abs 0.6 (*) 0.7 - 4.0 K/uL   Monocytes Relative 4  3 - 12 %   Monocytes Absolute 0.5  0.1 - 1.0 K/uL   Eosinophils Relative 4  0 - 5 %   Eosinophils Absolute 0.4  0.0 - 0.7 K/uL   Basophils Relative 0  0 - 1 %   Basophils Absolute 0.0  0.0 - 0.1 K/uL  COMPREHENSIVE METABOLIC PANEL     Status: Abnormal   Collection Time   05/03/12  2:09 AM      Component Value Range   Sodium 135  135 - 145 mEq/L   Potassium 4.6  3.5 - 5.1 mEq/L   Chloride 103  96 - 112 mEq/L   CO2 19  19 - 32 mEq/L   Glucose, Bld 176 (*) 70 - 99 mg/dL   BUN 22  6 - 23 mg/dL   Creatinine, Ser 7.82 (*) 0.50 - 1.10 mg/dL   Calcium 8.9  8.4 - 95.6 mg/dL   Total Protein 6.7  6.0 - 8.3 g/dL   Albumin 3.2 (*) 3.5 - 5.2 g/dL   AST 22  0 - 37 U/L   ALT 14  0 - 35 U/L   Alkaline Phosphatase 91  39 - 117 U/L   Total Bilirubin 0.3  0.3 - 1.2 mg/dL   GFR calc non Af Amer 31 (*) >90 mL/min   GFR calc Af Amer 36 (*) >90 mL/min  PRO B NATRIURETIC PEPTIDE     Status: Abnormal   Collection Time   05/03/12  2:10 AM      Component Value Range   Pro B Natriuretic peptide (BNP) 3006.0 (*) 0 - 125 pg/mL  TROPONIN I     Status: Normal   Collection Time   05/03/12  2:10 AM      Component Value Range   Troponin I <0.30  <0.30 ng/mL  PROTIME-INR     Status: Abnormal   Collection Time   05/03/12  2:20 AM      Component Value Range   Prothrombin Time 21.0 (*) 11.6 - 15.2 seconds   INR 1.89 (*) 0.00 - 1.49  POCT I-STAT 3, BLOOD GAS (G3+)     Status: Abnormal   Collection Time   05/03/12  3:15 AM      Component Value Range   pH, Arterial 7.361  7.350 - 7.450   pCO2 arterial 34.1 (*) 35.0 - 45.0 mmHg   pO2, Arterial 115.0 (*) 80.0 - 100.0 mmHg   Bicarbonate 19.3 (*) 20.0 - 24.0 mEq/L   TCO2 20  0 - 100 mmol/L   O2 Saturation 98.0     Acid-base deficit 5.0 (*) 0.0 - 2.0 mmol/L   Collection site RADIAL, ALLEN'S TEST ACCEPTABLE     Drawn by RT     Sample type ARTERIAL    URINALYSIS, MICROSCOPIC ONLY     Status: Abnormal   Collection Time   05/03/12  3:26 AM      Component Value Range   Color, Urine YELLOW  YELLOW   APPearance CLOUDY (*) CLEAR   Specific Gravity, Urine 1.011  1.005 - 1.030   pH 5.5  5.0 - 8.0   Glucose, UA NEGATIVE  NEGATIVE mg/dL   Hgb urine dipstick TRACE (*) NEGATIVE   Bilirubin Urine NEGATIVE  NEGATIVE   Ketones, ur NEGATIVE  NEGATIVE mg/dL   Protein, ur >161 (*) NEGATIVE mg/dL   Urobilinogen, UA 0.2  0.0 - 1.0 mg/dL   Nitrite NEGATIVE  NEGATIVE   Leukocytes, UA NEGATIVE  NEGATIVE   WBC, UA 0-2  <3 WBC/hpf   RBC / HPF 0-2  <3 RBC/hpf   Bacteria, UA FEW (*) RARE   Squamous Epithelial / LPF FEW (*) RARE   Casts GRANULAR CAST (*) NEGATIVE   Urine-Other  AMORPHOUS URATES/PHOSPHATES    MRSA PCR SCREENING     Status: Normal   Collection Time   05/03/12  6:34 AM      Component Value Range   MRSA by PCR NEGATIVE  NEGATIVE  COMPREHENSIVE METABOLIC PANEL     Status: Abnormal   Collection Time   05/03/12  6:50 AM      Component Value Range   Sodium 136  135 - 145 mEq/L   Potassium 5.0  3.5 - 5.1 mEq/L   Chloride 103  96 - 112 mEq/L   CO2 21  19 - 32 mEq/L   Glucose, Bld 106 (*) 70 - 99 mg/dL   BUN 21  6 - 23 mg/dL   Creatinine, Ser 0.96 (*) 0.50 - 1.10 mg/dL   Calcium 8.9  8.4 - 04.5 mg/dL   Total Protein 6.3  6.0 - 8.3 g/dL   Albumin 3.1 (*) 3.5 - 5.2 g/dL   AST 21  0 - 37 U/L   ALT 14  0 - 35 U/L   Alkaline Phosphatase 88  39 - 117 U/L   Total Bilirubin 0.2 (*) 0.3 - 1.2 mg/dL   GFR calc non Af Amer 29 (*) >90 mL/min   GFR calc Af Amer 34 (*) >90 mL/min  MAGNESIUM     Status: Normal   Collection Time   05/03/12  6:50 AM      Component Value Range   Magnesium 2.1  1.5 - 2.5  mg/dL  GLUCOSE, CAPILLARY     Status: Abnormal   Collection Time   05/03/12  8:05 AM      Component Value Range   Glucose-Capillary 102 (*) 70 - 99 mg/dL    ASSESSMENT AND PLAN:  Pulmonary edema: She would absolutely refuse Lasix.  We don't have IV Demadex.  I will change to PO Demadex.    CKD: Her creatinine currently is at baseline up slightly this am.  IV diuretic discontinued. We will follow closely.   PAF: She is maintaining sinus rhythm. No change in therapy is indicated. She's on chronic anticoagulation for this and other indications. Coumadin per pharmacy.    Diabetes: She will continue her home regimen. On SSI  Sleep apnea: She uses CPAP at night.   HTN: continue IV NTG and start PO hydralazine   Rollene Rotunda 05/03/2012 9:06 AM

## 2012-05-03 NOTE — ED Notes (Signed)
Pt remains on bipap. nad noted, voices that she feels much better. Ed techs at bedside to place foley catheter

## 2012-05-03 NOTE — Progress Notes (Signed)
7:25 AM  Torsemide ordered. Unavailble now b/c of backorder. Changed to lasix. Pt "allergy"  is muscle cramping. K+ is WNL this am. .  Janice Coffin

## 2012-05-03 NOTE — Care Management Note (Signed)
    Page 1 of 1   05/03/2012     10:05:49 AM   CARE MANAGEMENT NOTE 05/03/2012  Patient:  Wendy Cole, Wendy Cole   Account Number:  [id number]  Date Initiated:  05/03/2012  Documentation initiated by:  Wendy Cole  Subjective/Objective Assessment:   adm w pul edema     Action/Plan:   lives w husband, pcp dr a plotnikov   Anticipated DC Date:     Anticipated DC Plan:  HOME W HOME HEALTH SERVICES      DC Planning Services  CM consult      Choice offered to / List presented to:             Status of service:   Medicare Important Message given?   (If response is "NO", the following Medicare IM given date fields will be blank) Date Medicare IM given:   Date Additional Medicare IM given:    Discharge Disposition:    Per UR Regulation:  Reviewed for med. necessity/level of care/duration of stay  If discussed at Long Length of Stay Meetings, dates discussed:    Comments:  12/10 10a Wendy Vermell [place] rn,bsn [phone] adm w pul edema. will moniter for dc needs. may benefit from hhrn. will await progress.

## 2012-05-03 NOTE — ED Notes (Signed)
Dr Caprice Red at bedside.

## 2012-05-03 NOTE — Progress Notes (Signed)
ANTICOAGULATION CONSULT NOTE - Initial Consult  Pharmacy Consult for Coumadin Indication: Afib and h/o DVT  Allergies  Allergen Reactions  . Amlodipine     edema  . Atorvastatin Other (See Comments)    Muscle aches  . Colesevelam Other (See Comments)    unknown  . Lasix (Furosemide) Other (See Comments)    Muscle cramps  . Statins Other (See Comments)    Muscle aches  . Tape Rash     Vital Signs: Temp: 99.6 F (37.6 C) (12/10 0600) Temp src: Axillary (12/10 0157) BP: 173/44 mmHg (12/10 0600) Pulse Rate: 61  (12/10 0600)  Labs:  Basename 05/03/12 0220 05/03/12 0210 05/03/12 0209  HGB -- -- 9.4*  HCT -- -- 30.5*  PLT -- -- 347  APTT -- -- --  LABPROT 21.0* -- --  INR 1.89* -- --  HEPARINUNFRC -- -- --  CREATININE -- -- 1.59*  CKTOTAL -- -- --  CKMB -- -- --  TROPONINI -- <0.30 --     Medical History: Past Medical History  Diagnosis Date  . Anemia   . Anxiety states   . Depressive disorder, not elsewhere classified   . Type II or unspecified type diabetes mellitus without mention of complication, not stated as uncontrolled   . Hyperlipidemia   . Unspecified essential hypertension   . Renal insufficiency   . Osteoarthritis   . Gout   . Iritis   . Nonischemic cardiomyopathy     EF 20 to 25% per echo 06/2011  . Chronic anticoagulation   . NSTEMI (non-ST elevated myocardial infarction) Feb 2013    06/2011 cath  Mild nonobstructive disease. No LV gram  . PAF (paroxysmal atrial fibrillation)   . Obesity   . Cancer 2010    Right breast  . DVT (deep venous thrombosis) 2010  . Stroke 2004    Left upper lobe  . Thyroid disease 1969    Three fourth of Thyroid removed  . Complication of anesthesia     "fights it", for colonoscopy  . Flash pulmonary edema     06/2011  . HOH (hard of hearing)     deaf in L completely  . OSA on CPAP     CPAP at night  . GERD (gastroesophageal reflux disease)   . Hypothyroidism     Medications:  Prescriptions prior to  admission  Medication Sig Dispense Refill  . allopurinol (ZYLOPRIM) 100 MG tablet Take 100 mg by mouth daily.      [name] anastrozole (ARIMIDEX) 1 MG tablet TAKE 1 TABLET DAILY  90 tablet  1  . brimonidine (ALPHAGAN) 0.15 % ophthalmic solution Place 1 drop into both eyes daily.       [name] buPROPion (WELLBUTRIN SR) 150 MG 12 hr tablet TAKE 1 TABLET BY MOUTH EVERY DAY  30 tablet  PRN  . calcium carbonate (CALCIUM 500) 1250 MG tablet Take 1 tablet by mouth daily.      . carvedilol (COREG) 12.5 MG tablet Take 12.5-25 mg by mouth 2 (two) times daily with a meal. Take 2 tablets every morning and take 1 tablet every evening      . clorazepate (TRANXENE) 7.5 MG tablet Take 7.5 mg by mouth 2 (two) times daily as needed. For anxiety      . ferrous sulfate 325 (65 FE) MG tablet Take 325 mg by mouth daily with breakfast.      . Fluticasone-Salmeterol (ADVAIR) 100-50 MCG/DOSE AEPB Inhale 1 puff into the lungs every 12 (twelve)  hours.      . levothyroxine (SYNTHROID, LEVOTHROID) 137 MCG tablet Take 137 mcg by mouth daily.      [name] LORazepam (ATIVAN) 1 MG tablet Take 1 mg by mouth every 8 (eight) hours as needed. For anxiety      . omeprazole (PRILOSEC) 40 MG capsule Take 40 mg by mouth daily.       . prednisoLONE acetate (PRED FORTE) 1 % ophthalmic suspension Place 1 drop into both eyes daily as needed. For eye pain      . timolol (TIMOPTIC) 0.5 % ophthalmic solution Place 1 drop into both eyes 2 (two) times daily. Twice daily      . torsemide (DEMADEX) 20 MG tablet Take 20 mg by mouth daily.       . traMADol (ULTRAM) 50 MG tablet Take 1 tablet (50 mg total) by mouth at bedtime as needed. For sleep and pain  60 tablet  3  . vitamin D, CHOLECALCIFEROL, 400 UNITS tablet Take 400 Units by mouth daily.       [name] warfarin (COUMADIN) 1 MG tablet Take 2-3 mg by mouth as directed. Take 3 mg on Mondays, Wednesdays, and Fridays. Take 2 mg on Sunday, Tuesdays, Thursdays, and Saturdays.      . ciprofloxacin (CIPRO) 500 MG tablet Take  1 tablet (500 mg total) by mouth 2 (two) times daily.  20 tablet  0   Scheduled:    . allopurinol  100 mg Oral Daily  . anastrozole  1 mg Oral Daily  . brimonidine  1 drop Both Eyes Daily  . buPROPion  150 mg Oral BID  . calcium carbonate  1 tablet Oral Daily  . carvedilol  12.5 mg Oral BID WC  . cholecalciferol  400 Units Oral Daily  . ferrous sulfate  325 mg Oral Q breakfast  . levothyroxine  137 mcg Oral Daily  . mometasone-formoterol  2 puff Inhalation BID  . pantoprazole  40 mg Oral Daily  . sodium chloride  3 mL Intravenous Q12H  . timolol  1 drop Both Eyes BID  . torsemide  20 mg Intravenous BID  . warfarin  4 mg Oral ONCE-1800  . Warfarin - Pharmacist Dosing Inpatient   Does not apply q1800    Assessment: 76yo female admitted for pulmonary edema to continue Coumadin for Afib and h/o DVT, admitted with slightly subtherapeutic INR.  Goal of Therapy:  INR 2-3   Plan:  Will give boosted Coumadin dose of 4mg  po x1 today and monitor INR to consider resuming home regimen of 2mg  TTSS and 3mg  MWF.  Colleen Can PharmD BCPS 05/03/2012,6:22 AM

## 2012-05-04 LAB — BASIC METABOLIC PANEL
CO2: 26 mEq/L (ref 19–32)
Calcium: 9.5 mg/dL (ref 8.4–10.5)
Chloride: 100 mEq/L (ref 96–112)
GFR calc Af Amer: 29 mL/min — ABNORMAL LOW (ref >90)
Sodium: 137 mEq/L (ref 135–145)

## 2012-05-04 LAB — GLUCOSE, CAPILLARY
Glucose-Capillary: 144 mg/dL — ABNORMAL HIGH (ref 70–99)
Glucose-Capillary: 128 mg/dL — ABNORMAL HIGH (ref 70–99)
Glucose-Capillary: 103 mg/dL — ABNORMAL HIGH (ref 70–99)

## 2012-05-04 LAB — PROTIME-INR
INR: 2.23 — ABNORMAL HIGH (ref 0.00–1.49)
Prothrombin Time: 23.7 seconds — ABNORMAL HIGH (ref 11.6–15.2)

## 2012-05-04 MED ORDER — TRAMADOL HCL 50 MG PO TABS
100.0000 mg | ORAL_TABLET | Freq: Once | ORAL | Status: AC
  Administered 2012-05-04: 100 mg via ORAL
  Filled 2012-05-04: qty 2

## 2012-05-04 MED ORDER — WARFARIN SODIUM 2 MG PO TABS
2.0000 mg | ORAL_TABLET | ORAL | Status: DC
  Filled 2012-05-04: qty 1

## 2012-05-04 MED ORDER — CARVEDILOL 12.5 MG PO TABS
12.5000 mg | ORAL_TABLET | Freq: Every evening | ORAL | Status: DC
  Administered 2012-05-04: 12.5 mg via ORAL
  Filled 2012-05-04 (×2): qty 1

## 2012-05-04 MED ORDER — ISOSORBIDE MONONITRATE ER 60 MG PO TB24
60.0000 mg | ORAL_TABLET | Freq: Every day | ORAL | Status: DC
  Administered 2012-05-04 – 2012-05-05 (×2): 60 mg via ORAL
  Filled 2012-05-04 (×2): qty 1

## 2012-05-04 MED ORDER — TORSEMIDE 10 MG PO TABS
10.0000 mg | ORAL_TABLET | Freq: Once | ORAL | Status: AC
  Administered 2012-05-04: 10 mg via ORAL
  Filled 2012-05-04: qty 1

## 2012-05-04 MED ORDER — WARFARIN SODIUM 3 MG PO TABS
3.0000 mg | ORAL_TABLET | ORAL | Status: DC
  Administered 2012-05-04: 3 mg via ORAL
  Filled 2012-05-04 (×2): qty 1

## 2012-05-04 MED ORDER — CARVEDILOL 25 MG PO TABS
25.0000 mg | ORAL_TABLET | Freq: Every morning | ORAL | Status: DC
  Filled 2012-05-04: qty 1

## 2012-05-04 MED ORDER — CARVEDILOL 25 MG PO TABS
25.0000 mg | ORAL_TABLET | Freq: Every day | ORAL | Status: DC
  Administered 2012-05-04 – 2012-05-05 (×2): 25 mg via ORAL
  Filled 2012-05-04 (×3): qty 1

## 2012-05-04 NOTE — Progress Notes (Signed)
Provided patient with CHF booklet and went over the information.  Provided teach back and patient adequately answered each question correctly in the booklet.

## 2012-05-04 NOTE — Progress Notes (Signed)
ANTICOAGULATION CONSULT NOTE - Follow Up Pharmacy Consult for Coumadin Indication: Afib and h/o DVT  Allergies  Allergen Reactions  . Amlodipine     edema  . Atorvastatin Other (See Comments)    Muscle aches  . Colesevelam Other (See Comments)    unknown  . Lasix (Furosemide) Other (See Comments)    Muscle cramps  . Statins Other (See Comments)    Muscle aches  . Tape Rash   Vital Signs: Temp: 97.6 F (36.4 C) (12/11 0300) Temp src: Oral (12/10 2300) BP: 173/36 mmHg (12/11 0900) Pulse Rate: 69  (12/11 0900)  Labs:  Alvira Philips 05/04/12 0448 05/03/12 0650 05/03/12 0220 05/03/12 0210 05/03/12 0209  HGB -- -- -- -- 9.4*  HCT -- -- -- -- 30.5*  PLT -- -- -- -- 347  APTT -- -- -- -- --  LABPROT 23.7* -- 21.0* -- --  INR 2.23* -- 1.89* -- --  HEPARINUNFRC -- -- -- -- --  CREATININE 1.90* 1.67* -- -- 1.59*  CKTOTAL -- -- -- -- --  CKMB -- -- -- -- --  TROPONINI -- -- -- <0.30 --   Medical History: Past Medical History  Diagnosis Date  . Anemia   . Anxiety states   . Depressive disorder, not elsewhere classified   . Type II or unspecified type diabetes mellitus without mention of complication, not stated as uncontrolled   . Hyperlipidemia   . Unspecified essential hypertension   . Renal insufficiency   . Osteoarthritis   . Gout   . Iritis   . Nonischemic cardiomyopathy     EF 20 to 25% per echo 06/2011  . Chronic anticoagulation   . NSTEMI (non-ST elevated myocardial infarction) Feb 2013    06/2011 cath  Mild nonobstructive disease. No LV gram  . PAF (paroxysmal atrial fibrillation)   . Obesity   . Cancer 2010    Right breast  . DVT (deep venous thrombosis) 2010  . Stroke 2004    Left upper lobe  . Thyroid disease 1969    Three fourth of Thyroid removed  . Complication of anesthesia     "fights it", for colonoscopy  . Flash pulmonary edema     06/2011  . HOH (hard of hearing)     deaf in L completely  . OSA on CPAP     CPAP at night  . GERD  (gastroesophageal reflux disease)   . Hypothyroidism    Medications:  Prescriptions prior to admission  Medication Sig Dispense Refill  . allopurinol (ZYLOPRIM) 100 MG tablet Take 100 mg by mouth daily.      [name] anastrozole (ARIMIDEX) 1 MG tablet TAKE 1 TABLET DAILY  90 tablet  1  . brimonidine (ALPHAGAN) 0.15 % ophthalmic solution Place 1 drop into both eyes daily.       [name] buPROPion (WELLBUTRIN SR) 150 MG 12 hr tablet TAKE 1 TABLET BY MOUTH EVERY DAY  30 tablet  PRN  . calcium carbonate (CALCIUM 500) 1250 MG tablet Take 1 tablet by mouth daily.      . carvedilol (COREG) 12.5 MG tablet Take 12.5-25 mg by mouth 2 (two) times daily with a meal. Take 2 tablets every morning and take 1 tablet every evening      . ferrous sulfate 325 (65 FE) MG tablet Take 325 mg by mouth daily with breakfast.      . Fluticasone-Salmeterol (ADVAIR) 100-50 MCG/DOSE AEPB Inhale 1 puff into the lungs every 12 (twelve) hours.      [name]  levothyroxine (SYNTHROID, LEVOTHROID) 137 MCG tablet Take 137 mcg by mouth daily.      [name] LORazepam (ATIVAN) 1 MG tablet Take 1 mg by mouth every 8 (eight) hours as needed. For anxiety      . omeprazole (PRILOSEC) 40 MG capsule Take 40 mg by mouth daily.       . prednisoLONE acetate (PRED FORTE) 1 % ophthalmic suspension Place 1 drop into both eyes daily as needed. For eye pain      . timolol (TIMOPTIC) 0.5 % ophthalmic solution Place 1 drop into both eyes 2 (two) times daily. Twice daily      . torsemide (DEMADEX) 20 MG tablet Take 20 mg by mouth daily.       . traMADol (ULTRAM) 50 MG tablet Take 1 tablet (50 mg total) by mouth at bedtime as needed. For sleep and pain  60 tablet  3  . vitamin D, CHOLECALCIFEROL, 400 UNITS tablet Take 400 Units by mouth daily.       [name] warfarin (COUMADIN) 1 MG tablet Take 2-3 mg by mouth as directed. Take 3 mg on Mondays, Wednesdays, and Fridays. Take 2 mg on Sunday, Tuesdays, Thursdays, and Saturdays.      . ciprofloxacin (CIPRO) 500 MG tablet Take 1 tablet  (500 mg total) by mouth 2 (two) times daily.  20 tablet  0   Assessment: 76yo female admitted for pulmonary edema and complaints of shortness of breath.  She is to continue Coumadin for Afib and h/o DVT.  Her INR is therapeutic this morning without noted bleeding complications.    Today:  PT/INR 23.6/2.23  HOME dose:  Warfarin 2mg  TTSS and 3mg  MWF.   Goal of Therapy:  INR 2-3   Plan:  - Continue home regimen and monitor daily INR.     Nadara Mustard, PharmD., MS Clinical Pharmacist Pager:  [phone] Thank you for allowing pharmacy to be part of this patients care team. 05/04/2012,9:59 AM

## 2012-05-04 NOTE — Progress Notes (Signed)
Pharmacist Heart Failure Core Measure Documentation  Assessment: Wendy Cole has an EF documented as 25-30% on 07/13/11 by ECHO.  Rationale: Heart failure patients with left ventricular systolic dysfunction (LVSD) and an EF < 40% should be prescribed an angiotensin converting enzyme inhibitor (ACEI) or angiotensin receptor blocker (ARB) at discharge unless a contraindication is documented in the medical record.  This patient is not currently on an ACEI or ARB for HF.  This note is being placed in the record in order to provide documentation that a contraindication to the use of these agents is present for this encounter.  ACE Inhibitor or Angiotensin Receptor Blocker is contraindicated (specify all that apply)  []   ACEI allergy AND ARB allergy []   Angioedema []   Moderate or severe aortic stenosis []   Hyperkalemia []   Hypotension []   Renal artery stenosis [x]   Worsening renal function, preexisting renal disease or dysfunction   [name] 05/04/2012 3:10 PM

## 2012-05-04 NOTE — Plan of Care (Signed)
Problem: Limited Adherence to Nutrition-Related Recommendations (NB-1.6) Goal: Nutrition education Formal process to instruct or train a patient/client in a skill or to impart knowledge to help patients/clients voluntarily manage or modify food choices and eating behavior to maintain or improve health.  Outcome: Completed/Met Date Met:  05/04/12  RD consulted for nutrition education regarding low sodium diet.  RD provided "Low Sodium Nutrition Therapy" handout from the [employer]. Reviewed patient's dietary recall. Provided examples on ways to decrease sodium intake in diet. Discouraged intake of processed foods and use of salt shaker. Teach back method used.  Expect fair compliance.  Body mass index is 36.90 kg/(m^2). Pt meets criteria for Obesity Class II based on current BMI.  Current diet order is Carbohydrate Modified Medium Calorie, patient is consuming approximately 80% of meals at this time. Labs and medications reviewed. No further nutrition interventions warranted at this time.  If additional nutrition issues arise, please re-consult RD.   Kirkland Hun, RD, LDN Pager #: [phone] After-Hours Pager #: [phone]

## 2012-05-04 NOTE — Progress Notes (Signed)
SUBJECTIVE:  Breathing perhaps back to baseline.  No pain  PHYSICAL EXAM Filed Vitals:   05/04/12 0400 05/04/12 0500 05/04/12 0600 05/04/12 0700  BP: 191/49 197/50 156/43 172/54  Pulse: 58 59 58 61  Temp:      TempSrc:      Resp: 17 20 20 14   Height:      Weight:  214 lb 15.2 oz (97.5 kg)    SpO2: 99% 98% 99% 100%   General:  No distress Lungs:  Improved breath sounds. Clear Heart:  RRR Abdomen:  Positive bowel sounds, no rebound no guarding Extremities:  No edema Neuro:  Nonfocal  LABS: Lab Results  Component Value Date   CKTOTAL 60 07/14/2011   CKMB 6.4* 07/14/2011   TROPONINI <0.30 05/03/2012   Results for orders placed during the hospital encounter of 05/03/12 (from the past 24 hour(s))  GLUCOSE, CAPILLARY     Status: Abnormal   Collection Time   05/03/12  8:05 AM      Component Value Range   Glucose-Capillary 102 (*) 70 - 99 mg/dL  GLUCOSE, CAPILLARY     Status: Abnormal   Collection Time   05/03/12 11:34 AM      Component Value Range   Glucose-Capillary 129 (*) 70 - 99 mg/dL  GLUCOSE, CAPILLARY     Status: Abnormal   Collection Time   05/03/12  4:46 PM      Component Value Range   Glucose-Capillary 102 (*) 70 - 99 mg/dL  GLUCOSE, CAPILLARY     Status: Abnormal   Collection Time   05/03/12  9:02 PM      Component Value Range   Glucose-Capillary 132 (*) 70 - 99 mg/dL  BASIC METABOLIC PANEL     Status: Abnormal   Collection Time   05/04/12  4:48 AM      Component Value Range   Sodium 137  135 - 145 mEq/L   Potassium 4.1  3.5 - 5.1 mEq/L   Chloride 100  96 - 112 mEq/L   CO2 26  19 - 32 mEq/L   Glucose, Bld 131 (*) 70 - 99 mg/dL   BUN 26 (*) 6 - 23 mg/dL   Creatinine, Ser 1.61 (*) 0.50 - 1.10 mg/dL   Calcium 9.5  8.4 - 09.6 mg/dL   GFR calc non Af Amer 25 (*) >90 mL/min   GFR calc Af Amer 29 (*) >90 mL/min  PROTIME-INR     Status: Abnormal   Collection Time   05/04/12  4:48 AM      Component Value Range   Prothrombin Time 23.7 (*) 11.6 - 15.2  seconds   INR 2.23 (*) 0.00 - 1.49    ASSESSMENT AND PLAN:  Pulmonary edema (acute on chronic systolic and diastolic congestive heart failure): She would absolutely refuse Lasix.  Tolerating PO Demadex but I will reduce the dose.  I will stop the IV NTG.  I will start Imdur and continue hydralazine.  I increased her beta blocker to her previous dose. She doesn't seem to have understanding of her medical condition (Heart Failure).  I have asked for education material and for a dietary consult.   CKD: Her creatinine currently is at baseline up slightly this am.  I will reduce the diuretic today.   PAF: She is maintaining sinus rhythm. No change in therapy is indicated. She's on chronic anticoagulation for this and other indications. Coumadin per pharmacy.    Diabetes: She will continue her home regimen.  Sleep apnea: She uses CPAP at night and can use her home machine.   HTN: I will increase the beta blocker and change to PO nitrates.    Fayrene Fearing [facility] 05/04/2012 7:23 AM

## 2012-05-05 ENCOUNTER — Encounter (HOSPITAL_COMMUNITY): Payer: Self-pay | Admitting: Physician Assistant

## 2012-05-05 ENCOUNTER — Other Ambulatory Visit: Payer: Self-pay | Admitting: *Deleted

## 2012-05-05 DIAGNOSIS — I5023 Acute on chronic systolic (congestive) heart failure: Secondary | ICD-10-CM

## 2012-05-05 DIAGNOSIS — I480 Paroxysmal atrial fibrillation: Secondary | ICD-10-CM

## 2012-05-05 LAB — BASIC METABOLIC PANEL
Chloride: 100 mEq/L (ref 96–112)
GFR calc Af Amer: 26 mL/min — ABNORMAL LOW (ref >90)
GFR calc non Af Amer: 22 mL/min — ABNORMAL LOW (ref >90)
Potassium: 4.5 mEq/L (ref 3.5–5.1)
Sodium: 139 mEq/L (ref 135–145)

## 2012-05-05 LAB — GLUCOSE, CAPILLARY: Glucose-Capillary: 139 mg/dL — ABNORMAL HIGH (ref 70–99)

## 2012-05-05 LAB — PROTIME-INR
INR: 2.3 — ABNORMAL HIGH (ref 0.00–1.49)
Prothrombin Time: 24.3 seconds — ABNORMAL HIGH (ref 11.6–15.2)

## 2012-05-05 MED ORDER — TORSEMIDE 10 MG PO TABS: 10.0000 mg | ORAL_TABLET | Freq: Every day | ORAL | Status: DC

## 2012-05-05 MED ORDER — HYDRALAZINE HCL 25 MG PO TABS: 25.0000 mg | ORAL_TABLET | Freq: Three times a day (TID) | ORAL | Status: DC

## 2012-05-05 MED ORDER — ISOSORBIDE MONONITRATE ER 60 MG PO TB24: 60.0000 mg | ORAL_TABLET | Freq: Every day | ORAL | Status: DC

## 2012-05-05 NOTE — Progress Notes (Signed)
[name], female   DOB: December 11, 1935, 76 y.o.   MRN: [id number]   SUBJECTIVE: The patient is feeling better and lying flat in bed. She is expecting to go home today. She has diaries since being here. Her creatinine has actually gone up from 1.7-2.0. In reviewing the labs today, I am concerned that her creatinine has increased. However after careful review it appears that her creatinine has been in the range of 2.0 in the past. Considering all the data it seems that we will have to allow her creatinine to be in this range for her volume status to be stable enough to keep her out of CHF and pulmonary edema. She'll have very careful followup as an outpatient.   Filed Vitals:   05/04/12 1400 05/04/12 1744 05/04/12 2057 05/05/12 0557  BP: 138/78 118/42 173/70 188/67  Pulse: 70 74 61 58  Temp: 97.7 F (36.5 C)  97.2 F (36.2 C) 98.1 F (36.7 C)  TempSrc: Oral  Oral Oral  Resp: 18  18 16   Height: 5\' 4"  (1.626 m)     Weight: 214 lb 3.2 oz (97.16 kg)   213 lb 9.6 oz (96.888 kg)  SpO2: 92%  92% 94%    Intake/Output Summary (Last 24 hours) at 05/05/12 0702 Last data filed at 05/05/12 0558  Gross per 24 hour  Intake    513 ml  Output   1800 ml  Net  -1287 ml    LABS: Basic Metabolic Panel:  Basename 05/04/12 0448 05/03/12 0650  NA 137 136  K 4.1 5.0  CL 100 103  CO2 26 21  GLUCOSE 131* 106*  BUN 26* 21  CREATININE 1.90* 1.67*  CALCIUM 9.5 8.9  MG -- 2.1  PHOS -- --   Liver Function Tests:  [facility] 05/03/12 0650 05/03/12 0209  AST 21 22  ALT 14 14  ALKPHOS 88 91  BILITOT 0.2* 0.3  PROT 6.3 6.7  ALBUMIN 3.1* 3.2*   No results found for this basename: LIPASE:2,AMYLASE:2 in the last 72 hours CBC:  Basename 05/03/12 0209  WBC 11.9*  NEUTROABS 10.4*  HGB 9.4*  HCT 30.5*  MCV 80.9  PLT 347   Cardiac Enzymes:  Basename 05/03/12 0210  CKTOTAL --  CKMB --  CKMBINDEX --  TROPONINI <0.30   BNP: No components found with this basename:  POCBNP:3 D-Dimer: No results found for this basename: DDIMER:2 in the last 72 hours Hemoglobin A1C: No results found for this basename: HGBA1C in the last 72 hours Fasting Lipid Panel: No results found for this basename: CHOL,HDL,LDLCALC,TRIG,CHOLHDL,LDLDIRECT in the last 72 hours Thyroid Function Tests:  Basename 05/03/12 0650  TSH 2.067  T4TOTAL --  T3FREE --  THYROIDAB --    RADIOLOGY: Dg Lumbar Spine 2-3 Views  04/14/2012  *RADIOLOGY REPORT*  Clinical Data: Low back pain.  Lumbosacral pain.  Chronic back pain.  LUMBAR SPINE - 2-3 VIEW  Comparison: CT 02/04/2009.  Findings: There are five non-rib bearing lumbar-type vertebral bodies which are labeled L1-L5 on the lateral image.  Nonaneurysmal aortic and common iliac arterial calcifications are present.  There is slight scoliosis convexity to the left.  There is an osteopenic appearance of the bones.  SI joints appear intact.  There is some marginal osteophyte formation representing degenerative spondylosis.  There is slight narrowing of some of the intervertebral disc spaces.  No acute fracture or bony destruction is evident.  There is slight loss of anterior height and depression of superior endplate of  the L1 vertebral body.  This is chronic and unchanged.  IMPRESSION: Changes of degenerative spondylosis and degenerative disc disease. No acute fracture evident.  Chronic slight loss of anterior height and depression of superior endplate of L1 vertebral body. Nonaneurysmal aortic arterial calcifications.  Slight scoliosis. Osteopenic appearance of bones.   Original Report Authenticated By: Onalee Hua Call    Dg Chest Port 1 View  05/03/2012  *RADIOLOGY REPORT*  Clinical Data: Shortness of breath  PORTABLE CHEST - 1 VIEW  Comparison: 02/02/2012  Findings: Cardiac enlargement with mild pulmonary vascular congestion and interstitial edema.  Changes are similar to previous studies.  No developing focal consolidation.  No blunting of costophrenic  angles.  No pneumothorax.  Calcification of the aorta.  IMPRESSION: Cardiac enlargement with pulmonary vascular congestion and interstitial edema similar to previous study.   Original Report Authenticated By: Burman Nieves, M.D.     PHYSICAL EXAM  The patient is lying flat in bed. I answered multiple questions for her. There is no jugulovenous distention. Lungs are clear. Respiratory effort is nonlabored. Cardiac exam reveals S1 and S2. There no clicks or significant murmurs. The abdomen is soft. There is no peripheral edema.   TELEMETRY:  I have reviewed telemetry today May 05, 2012. There is normal sinus rhythm.   ASSESSMENT AND PLAN:   *Flash pulmonary edema     She presented with this. She is stabilized. She is ready to go home.   DIABETES MELLITUS, TYPE II    This is stable   OSA on CPAP    Patient refused to use her CPAP here the hospital last night.   Paroxysmal atrial fibrillation    The patient is on Coumadin. Her rhythm is sinus.   Nonischemic cardiomyopathy     The patient has had hydralazine and nitrates added to her regimen. She will be discharged home on these medicines.   Hypertension associated with diabetes     I suspect that hypertension plays a role with her flash pulmonary edema. The hydralazine and nitrates will help with this. Careful attention will be paid to her blood pressure as an outpatient.   Chronic anticoagulation      The patient is on Coumadin and this is continued.   Acute renal failure     Creatinine has gone up during this hospitalization. As I outlined above, I suspect that her creatinine needs to remain in the range of 1.9 - 2.0 for her volume status remained stable. However with this rise in the hospital, she will need very early followup chemistry labs as an outpatient.   Acute on chronic systolic CHF (congestive heart failure)    The exact etiology for this acute event is not completely clear. I suspect that there is a combination of  reasons. It is my understanding that she does not necessarily take her diuretic daily. Her med list shows that she was supposedly taking 20 mg of Demadex daily at home. However, since it is not clear that she was taking it, we will discharge her on 10 mg of Demadex daily urging her to take it daily.  She will be discharged on a daily diuretic and encouraged to take it daily. She needs to weigh herself daily and to be very compulsive about this recording her weights and bringing them to the doctor's office. She will need careful followup of her renal function on the daily diuretic. It is possible that her creatinine will have to be left in the 2.0 range. She needs to  take the new medicines including hydralazine and nitrates with careful followup of her blood pressures.  Willa Rough 05/05/2012 7:02 AM

## 2012-05-05 NOTE — Progress Notes (Signed)
ANTICOAGULATION CONSULT NOTE - Follow Up Pharmacy Consult for Coumadin Indication: Afib and h/o DVT  Vital Signs: Temp: 98.1 F (36.7 C) (12/12 0557) Temp src: Oral (12/12 0557) BP: 168/71 mmHg (12/12 0836) Pulse Rate: 77  (12/12 0836)  Labs:  Basename 05/05/12 0602 05/04/12 0448 05/03/12 0650 05/03/12 0220 05/03/12 0210 05/03/12 0209  HGB -- -- -- -- -- 9.4*  HCT -- -- -- -- -- 30.5*  PLT -- -- -- -- -- 347  APTT -- -- -- -- -- --  LABPROT 24.3* 23.7* -- 21.0* -- --  INR 2.30* 2.23* -- 1.89* -- --  HEPARINUNFRC -- -- -- -- -- --  CREATININE 2.09* 1.90* 1.67* -- -- --  CKTOTAL -- -- -- -- -- --  CKMB -- -- -- -- -- --  TROPONINI -- -- -- -- <0.30 --   Assessment: 76yo female admitted for pulmonary edema and complaints of shortness of breath.  She is to continue Coumadin for Afib and h/o DVT.  INR this am is at goal on home dose.  No complications noted.  HOME dose:  Warfarin 2mg  TTSS and 3mg  MWF.  Goal of Therapy:  INR 2-3   Plan:  - Continue home regimen - Change INR to MWF  Monique Hefty L. Illene Bolus, PharmD, BCPS Clinical Pharmacist Pager: [phone] Pharmacy: [phone] 05/05/2012 9:54 AM

## 2012-05-05 NOTE — Discharge Summary (Signed)
CARDIOLOGY DISCHARGE SUMMARY   [name] MRN: [id number] DOB/AGE: [birth year] 76 y.o.  Admit date: 05/03/2012 Discharge date: 05/05/2012  Primary Discharge Diagnosis:   *Flash pulmonary edema  Secondary Discharge Diagnosis:   DIABETES MELLITUS, TYPE II  OSA on CPAP  PAF (paroxysmal atrial fibrillation)  Nonischemic cardiomyopathy  Hypertension associated with diabetes  Chronic anticoagulation  Acute renal failure  Acute on chronic systolic CHF (congestive heart failure) Chronic kidney disease stage III/IV  Procedures: None  Hospital Course: Wendy Cole is a 76 year old female with a history of heart failure. She developed acute shortness of breath and came to the hospital where she was in flash pulmonary edema. She was on them overloaded and admitted for further evaluation and treatment.  She was diuresed with Demadex and lost 6 pounds during her hospital stay. As her volume status improved her shortness of breath improved. She has a history of chronic kidney disease with a creatinine between 1.4 and 2.0. Her BUN and creatinine worsened with diuresis but her labs were reviewed and it was felt her creatinine would need to be in this range for her volume status to be stable. She admitted to dietary indiscretion and noncompliance with medications. She was encouraged to stick to a 2 g sodium, heart healthy diet. She was also encouraged to take her diuretic on a daily basis to weigh herself daily to track her weight.  There was concern at that inadequately treated hypertension was a cause for her flash pulmonary edema. She had hydralazine and nitrates added to her medication regimen and tolerated these medications. Because of her renal insufficiency, she is not on an ACE inhibitor or an ARB. She is on a beta blocker. She has a history of obstructive sleep apnea and CPAP was ordered for her but she refused while in the hospital. Her blood sugars were monitored while  she was in the hospital and they were stable. Her INR was also monitored and her Coumadin level is therapeutic.  By 05/05/2012, Wendy Cole was doing better and her shortness of breath was at baseline. Dr. Myrtis Ser reviewed her medications and medical conditions. He felt she was significantly improved and further management could be done as an outpatient. He therefore considered her stable for discharge, with close outpatient followup arranged.  Labs:   Lab Results  Component Value Date   WBC 11.9* 05/03/2012   HGB 9.4* 05/03/2012   HCT 30.5* 05/03/2012   MCV 80.9 05/03/2012   PLT 347 05/03/2012     Lab 05/05/12 0602 05/03/12 0650  NA 139 --  K 4.5 --  CL 100 --  CO2 24 --  BUN 31* --  CREATININE 2.09* --  CALCIUM 10.0 --  PROT -- 6.3  BILITOT -- 0.2*  ALKPHOS -- 88  ALT -- 14  AST -- 21  GLUCOSE 114* --    Basename 05/03/12 0210  CKTOTAL --  CKMB --  CKMBINDEX --  TROPONINI <0.30   Pro B Natriuretic peptide (BNP)  Date/Time Value Range Status  05/03/2012  2:10 AM 3006.0* 0 - 125 pg/mL Final  02/02/2012 12:29 PM 4495.0* 0 - 125 pg/mL Final    Basename 05/05/12 0602  INR 2.30*      Radiology: Dg Chest Port 1 View 05/03/2012  *RADIOLOGY REPORT*  Clinical Data: Shortness of breath  PORTABLE CHEST - 1 VIEW  Comparison: 02/02/2012  Findings: Cardiac enlargement with mild pulmonary vascular congestion and interstitial edema.  Changes are similar to previous studies.  No  developing focal consolidation.  No blunting of costophrenic angles.  No pneumothorax.  Calcification of the aorta.  IMPRESSION: Cardiac enlargement with pulmonary vascular congestion and interstitial edema similar to previous study.   Original Report Authenticated By: Burman Nieves, M.D.    EKG: 04-May-2012 06:51:01 [facility] ROUTINE RECORD Normal sinus rhythm Minimal voltage criteria for LVH, may be normal variant Inferior infarct , old Non-specific ST-t changes Abnormal  ECG dltnsc 45mm/s 61mm/mV 100Hz  8.0.1 12SL 241 HD CID: 0 Referred by: Confirmed By: Susa Griffins MD Vent. rate 60 BPM PR interval 172 Wendy QRS duration 98 Wendy QT/QTc 458/458 Wendy P-R-T axes 45 -2 73  FOLLOW UP PLANS AND APPOINTMENTS Allergies  Allergen Reactions  . Amlodipine     edema  . Atorvastatin Other (See Comments)    Muscle aches  . Colesevelam Other (See Comments)    unknown  . Lasix (Furosemide) Other (See Comments)    Muscle cramps  . Statins Other (See Comments)    Muscle aches  . Tape Rash     Medication List     As of 05/05/2012 12:46 PM    STOP taking these medications         ciprofloxacin 500 MG tablet   Commonly known as: CIPRO      TAKE these medications         allopurinol 100 MG tablet   Commonly known as: ZYLOPRIM   Take 100 mg by mouth daily.      anastrozole 1 MG tablet   Commonly known as: ARIMIDEX   TAKE 1 TABLET DAILY      brimonidine 0.15 % ophthalmic solution   Commonly known as: ALPHAGAN   Place 1 drop into both eyes daily.      buPROPion 150 MG 12 hr tablet   Commonly known as: WELLBUTRIN SR   TAKE 1 TABLET BY MOUTH EVERY DAY      CALCIUM 500 1250 MG tablet   Generic drug: calcium carbonate   Take 1 tablet by mouth daily.      carvedilol 12.5 MG tablet   Commonly known as: COREG   Take 12.5-25 mg by mouth 2 (two) times daily with a meal. Take 2 tablets every morning and take 1 tablet every evening      clorazepate 7.5 MG tablet   Commonly known as: TRANXENE   TABLET BY MOUTH TWICE A DAY AS NEEDED FOR ANXIETY      ferrous sulfate 325 (65 FE) MG tablet   Take 325 mg by mouth daily with breakfast.      Fluticasone-Salmeterol 100-50 MCG/DOSE Aepb   Commonly known as: ADVAIR   Inhale 1 puff into the lungs every 12 (twelve) hours.      hydrALAZINE 25 MG tablet   Commonly known as: APRESOLINE   Take 1 tablet (25 mg total) by mouth 3 (three) times daily.      isosorbide mononitrate 60 MG 24 hr tablet   Commonly known  as: IMDUR   Take 1 tablet (60 mg total) by mouth daily.      levothyroxine 137 MCG tablet   Commonly known as: SYNTHROID, LEVOTHROID   Take 137 mcg by mouth daily.      LORazepam 1 MG tablet   Commonly known as: ATIVAN   Take 1 mg by mouth every 8 (eight) hours as needed. For anxiety      omeprazole 40 MG capsule   Commonly known as: PRILOSEC   Take 40 mg by mouth  daily.      prednisoLONE acetate 1 % ophthalmic suspension   Commonly known as: PRED FORTE   Place 1 drop into both eyes daily as needed. For eye pain      timolol 0.5 % ophthalmic solution   Commonly known as: TIMOPTIC   Place 1 drop into both eyes 2 (two) times daily. Twice daily      torsemide 10 MG tablet   Commonly known as: DEMADEX   Take 1 tablet (10 mg total) by mouth daily.      traMADol 50 MG tablet   Commonly known as: ULTRAM   Take 1 tablet (50 mg total) by mouth at bedtime as needed. For sleep and pain      vitamin D (CHOLECALCIFEROL) 400 UNITS tablet   Take 400 Units by mouth daily.      warfarin 1 MG tablet   Commonly known as: COUMADIN   Take 2-3 mg by mouth as directed. Take 3 mg on Mondays, Wednesdays, and Fridays. Take 2 mg on Sunday, Tuesdays, Thursdays, and Saturdays.          Discharge Orders    Future Appointments: Provider: Department: Dept Phone: Center:   05/09/2012 10:00 AM [facility] Main Office [place]) [phone] [facility]   05/09/2012 10:30 AM Wendall Stade, MD [facility] Main Office [place]) [phone] [facility]   05/10/2012 1:30 PM [facility] [phone] None   05/20/2012 1:00 PM [facility] [phone] [facility]   05/20/2012 1:30 PM [facility] [phone] [facility]   05/20/2012 2:30 PM Fransisco Hertz, MD [facility] [phone] [facility]   06/14/2012 1:15 PM [facility] [phone] None   07/12/2012 1:00 PM [facility] [phone] None   07/20/2012 1:15 PM Tresa Garter, MD [facility] [phone] [facility]   08/09/2012 1:30 PM [facility] [place] [facility] [phone] None   09/08/2012 2:30 PM [facility] [phone] None   09/08/2012 3:00 PM Ladene Artist, MD [facility] MEDICAL ONCOLOGY [phone] None   09/13/2012 1:45 PM Waymon Budge, MD [facility] [phone] None     Future Orders Please Complete By Expires   Diet - low sodium heart healthy      Diet Carb Modified      Increase activity slowly      (HEART FAILURE PATIENTS) Call MD:  Anytime you have any of the following symptoms: 1) 3 pound weight gain in 24 hours or 5 pounds in 1 week 2) shortness of breath, with or without a dry hacking cough 3) swelling in the hands, feet or stomach 4) if you have to sleep on extra pillows at night in order to breathe.        Follow-up Information    Follow up with Charlton Haws, MD. On 05/09/2012. (Lab work and MD appointment starting at 10:00 am)    Contact information:   [address] [place] [address] [phone]          BRING ALL MEDICATIONS WITH YOU TO FOLLOW UP APPOINTMENTS  Time spent with patient to include physician time: 38 min Signed: Theodore Demark 05/05/2012, 12:46 PM I've seen the patient today. Please refer to  my complete progress note from earlier today. I made the decision for the patient to be discharged. I made the decision about the patient's meds. I communicated with Theodore Demark PA-C about all the plans.Jerral Bonito, MD

## 2012-05-05 NOTE — Progress Notes (Signed)
Patient refused cpap tonight. Patient told RT it was sleeping when RT entered room and patient told RT she did not want to wear it tonight.

## 2012-05-09 ENCOUNTER — Encounter: Payer: Self-pay | Admitting: Cardiovascular Disease

## 2012-05-09 ENCOUNTER — Ambulatory Visit (INDEPENDENT_AMBULATORY_CARE_PROVIDER_SITE_OTHER): Payer: Medicare Other | Admitting: Cardiovascular Disease

## 2012-05-09 ENCOUNTER — Other Ambulatory Visit (INDEPENDENT_AMBULATORY_CARE_PROVIDER_SITE_OTHER): Payer: Medicare Other

## 2012-05-09 VITALS — BP 130/50 | HR 60 | Ht 64.0 in | Wt 214.0 lb

## 2012-05-09 DIAGNOSIS — I509 Heart failure, unspecified: Secondary | ICD-10-CM

## 2012-05-09 DIAGNOSIS — Z79899 Other long term (current) drug therapy: Secondary | ICD-10-CM

## 2012-05-09 DIAGNOSIS — G4733 Obstructive sleep apnea (adult) (pediatric): Secondary | ICD-10-CM

## 2012-05-09 DIAGNOSIS — R0602 Shortness of breath: Secondary | ICD-10-CM

## 2012-05-09 DIAGNOSIS — I5023 Acute on chronic systolic (congestive) heart failure: Secondary | ICD-10-CM

## 2012-05-09 DIAGNOSIS — I658 Occlusion and stenosis of other precerebral arteries: Secondary | ICD-10-CM

## 2012-05-09 DIAGNOSIS — I428 Other cardiomyopathies: Secondary | ICD-10-CM

## 2012-05-09 DIAGNOSIS — I480 Paroxysmal atrial fibrillation: Secondary | ICD-10-CM

## 2012-05-09 DIAGNOSIS — I4891 Unspecified atrial fibrillation: Secondary | ICD-10-CM

## 2012-05-09 DIAGNOSIS — E785 Hyperlipidemia, unspecified: Secondary | ICD-10-CM

## 2012-05-09 DIAGNOSIS — I6523 Occlusion and stenosis of bilateral carotid arteries: Secondary | ICD-10-CM

## 2012-05-09 LAB — BASIC METABOLIC PANEL
CO2: 24 mEq/L (ref 19–32)
Calcium: 9.4 mg/dL (ref 8.4–10.5)
Chloride: 99 mEq/L (ref 96–112)
Potassium: 5.1 mEq/L (ref 3.5–5.1)
Sodium: 133 mEq/L — ABNORMAL LOW (ref 135–145)

## 2012-05-09 MED ORDER — TORSEMIDE 10 MG PO TABS: 10.0000 mg | ORAL_TABLET | Freq: Two times a day (BID) | ORAL | Status: DC

## 2012-05-09 NOTE — Patient Instructions (Addendum)
Your physician recommends that you schedule a follow-up appointment in: 4-6 WEEKS WITH DR [facility] Your physician has recommended you make the following change in your medication:  TORSEMIDE 10 MG  TWICE DAILY  MAY TAKE AN EXTRA TAB A DAY  FOR SHORTNESS OF BREATH Your physician recommends that you return for lab work in:  TODAY BMET BNP  DX   428.0 V58.69  Your physician recommends that you weigh, daily, at the same time every day, and in the same amount of clothing. Please record your daily weights on the handout provided and bring it to your next appointment.

## 2012-05-09 NOTE — Progress Notes (Signed)
[name], female   DOB: 06/28/1935, 76 y.o.   MRN: [id number] Ms Scheuermann is a 76 year old female with a history of heart failure NICM, PAF, OSA, HTN, prior DVT, renal insufficiency . Carotid duplex on 11/10/11 showed bilateral 80-99% ICA stenosis and she is post bilateral CEA;s She is normally seen by Dr [place] and saw Dr Tenny Craw in consultation at the hospital 6/26 for post op hypertension. She had a heart cath 07/13/11   She developed acute shortness of breath and came to the hospital where she was in flash pulmonary edema. She was on them overloaded and admitted for further evaluation and treatment.   She was diuresed with Demadex and lost 6 pounds during her hospital stay. As her volume status improved her shortness of breath improved. She has a history of chronic kidney disease with a creatinine between 1.4 and 2.0. Her BUN and creatinine worsened with diuresis but her labs were reviewed and it was felt her creatinine would need to be in this range for her volume status to be stable. She admitted to dietary indiscretion and noncompliance with medications. She was encouraged to stick to a 2 g sodium, heart healthy diet. She was also encouraged to take her diuretic on a daily basis to weigh herself daily to track her weight.  There was concern at that inadequately treated hypertension was a cause for her flash pulmonary edema. She had hydralazine and nitrates added to her medication regimen and tolerated these medications. Because of her renal insufficiency, she is not on an ACE inhibitor or an ARB. She is on a beta blocker. She has a history of obstructive sleep apnea and CPAP was ordered for her but she refused while in the hospital. Her blood sugars were monitored while she was in the hospital and they were stable. Her INR was also monitored and her Coumadin level is therapeutic.  Echo 2/13 reviewed Study Conclusions  - Left ventricle: Systolic function was severely reduced. The  estimated ejection fraction was in the range of 25% to 30%. Features are consistent with a pseudonormal left ventricular filling pattern, with concomitant abnormal relaxation and increased filling pressure (grade 2 diastolic dysfunction). Doppler parameters are consistent with elevated mean left atrial filling pressure. - Regional wall motion abnormality: Akinesis of the basal-mid anteroseptal myocardium; hypokinesis of the entire anterior, mid inferolateral, basal-mid anterolateral, and apical lateral myocardium. - Aortic valve: Mild regurgitation. - Mitral valve: Mild regurgitation. - Left atrium: The atrium was moderately dilated. - Right atrium: The atrium was mildly dilated. - Pulmonary arteries: PA peak pressure: 49mm Hg (S). Impressions:  - The right ventricular systolic pressure was increased consistent with mild pulmonary hypertension.  She is very frustated and depressed.  She has anxiety and sleep apnea.  She like to lay flat in bed and this causes more PND/orhtopnea.    ROS: Denies fever, malais, weight loss, blurry vision, decreased visual acuity, cough, sputum, SOB, hemoptysis, pleuritic pain, palpitaitons, heartburn, abdominal pain, melena, lower extremity edema, claudication, or rash.  All other systems reviewed and negative  General: Affect appropriate Chronically ill female HEENT: normal Neck supple with no adenopathy JVP normal bilateral  bruits no thyromegaly CEA x2  Lungs clear with no wheezing and good diaphragmatic motion Heart:  S1/S2 no murmur, no rub, gallop or click PMI normal Abdomen: benighn, BS positve, no tenderness, no AAA no bruit.  No HSM or HJR Distal pulses intact with no bruits No edema Neuro non-focal Skin warm and dry No muscular  weakness   Current Outpatient Prescriptions  Medication Sig Dispense Refill  . allopurinol (ZYLOPRIM) 100 MG tablet Take 100 mg by mouth daily.      [name] anastrozole (ARIMIDEX) 1 MG tablet TAKE 1 TABLET DAILY   90 tablet  1  . brimonidine (ALPHAGAN) 0.15 % ophthalmic solution Place 1 drop into both eyes daily.       [name] buPROPion (WELLBUTRIN SR) 150 MG 12 hr tablet TAKE 1 TABLET BY MOUTH EVERY DAY  30 tablet  PRN  . calcium carbonate (CALCIUM 500) 1250 MG tablet Take 1 tablet by mouth daily.      . carvedilol (COREG) 12.5 MG tablet Take 12.5-25 mg by mouth 2 (two) times daily with a meal. Take 2 tablets every morning and take 1 tablet every evening      . clorazepate (TRANXENE) 7.5 MG tablet TABLET BY MOUTH TWICE A DAY AS NEEDED FOR ANXIETY  30 tablet  5  . ferrous sulfate 325 (65 FE) MG tablet Take 325 mg by mouth daily with breakfast.      . Fluticasone-Salmeterol (ADVAIR) 100-50 MCG/DOSE AEPB Inhale 1 puff into the lungs every 12 (twelve) hours.      . hydrALAZINE (APRESOLINE) 25 MG tablet Take 1 tablet (25 mg total) by mouth 3 (three) times daily.  90 tablet  11  . isosorbide mononitrate (IMDUR) 60 MG 24 hr tablet Take 1 tablet (60 mg total) by mouth daily.  30 tablet  11  . levothyroxine (SYNTHROID, LEVOTHROID) 137 MCG tablet Take 137 mcg by mouth daily.      [name] LORazepam (ATIVAN) 1 MG tablet Take 1 mg by mouth every 8 (eight) hours as needed. For anxiety      . omeprazole (PRILOSEC) 40 MG capsule Take 40 mg by mouth daily.       . prednisoLONE acetate (PRED FORTE) 1 % ophthalmic suspension Place 1 drop into both eyes daily as needed. For eye pain      . timolol (TIMOPTIC) 0.5 % ophthalmic solution Place 1 drop into both eyes 2 (two) times daily. Twice daily      . torsemide (DEMADEX) 10 MG tablet Take 1 tablet (10 mg total) by mouth daily.  30 tablet  11  . traMADol (ULTRAM) 50 MG tablet Take 1 tablet (50 mg total) by mouth at bedtime as needed. For sleep and pain  60 tablet  3  . vitamin D, CHOLECALCIFEROL, 400 UNITS tablet Take 400 Units by mouth daily.       [name] warfarin (COUMADIN) 1 MG tablet Take 2-3 mg by mouth as directed. Take 3 mg on Mondays, Wednesdays, and Fridays. Take 2 mg on Sunday,  Tuesdays, Thursdays, and Saturdays.        Allergies  Amlodipine; Atorvastatin; Colesevelam; Lasix; Statins; and Tape  Electrocardiogram:  Assessment and Plan

## 2012-05-09 NOTE — Assessment & Plan Note (Signed)
Compliance with CPAP discussed Will help decrease risk of PAF as well

## 2012-05-09 NOTE — Assessment & Plan Note (Signed)
S/P bilateral CEA  F/U Dr Imogene Burn duplex in a year

## 2012-05-09 NOTE — Assessment & Plan Note (Signed)
In NSR today continue coumadin  F/U INR with clinic

## 2012-05-09 NOTE — Assessment & Plan Note (Signed)
Flash pulmonary edema x2 this year Filling pressures at cath 2/13 were ok and no CAD.  Demedex 10 bid with extra dose as needed.  Continue hydralazine and nitrates in lieu of ACE with CRF.  BMET and BNP today.  Nightly dyspnea is multifactorial and also related to OSA anxiety and possibly refulx

## 2012-05-09 NOTE — Assessment & Plan Note (Signed)
Cholesterol is at goal.  Continue current dose of statin and diet Rx.  No myalgias or side effects.  F/U  LFT's in 6 months. Lab Results  Component Value Date   LDLCALC  Value: 154        Total Cholesterol/HDL:CHD Risk Coronary Heart Disease Risk Table                     Men   Women  1/2 Average Risk   3.4   3.3  Average Risk       5.0   4.4  2 X Average Risk   9.6   7.1  3 X Average Risk  23.4   11.0        Use the calculated Patient Ratio above and the CHD Risk Table to determine the patient's CHD Risk.        ATP III CLASSIFICATION (LDL):  <100     mg/dL   Optimal  409-811  mg/dL   Near or Above                    Optimal  130-159  mg/dL   Borderline  914-782  mg/dL   High  >956     mg/dL   Very High* 07/08/863

## 2012-05-10 ENCOUNTER — Other Ambulatory Visit (HOSPITAL_BASED_OUTPATIENT_CLINIC_OR_DEPARTMENT_OTHER): Payer: Medicare Other | Admitting: Lab

## 2012-05-10 ENCOUNTER — Other Ambulatory Visit: Payer: Self-pay | Admitting: *Deleted

## 2012-05-10 DIAGNOSIS — Z86718 Personal history of other venous thrombosis and embolism: Secondary | ICD-10-CM

## 2012-05-10 DIAGNOSIS — R609 Edema, unspecified: Secondary | ICD-10-CM

## 2012-05-10 DIAGNOSIS — N289 Disorder of kidney and ureter, unspecified: Secondary | ICD-10-CM

## 2012-05-10 DIAGNOSIS — R0602 Shortness of breath: Secondary | ICD-10-CM

## 2012-05-10 DIAGNOSIS — Z79899 Other long term (current) drug therapy: Secondary | ICD-10-CM

## 2012-05-10 LAB — PROTIME-INR: INR: 2.5 (ref 2.00–3.50)

## 2012-05-11 ENCOUNTER — Telehealth: Payer: Self-pay | Admitting: *Deleted

## 2012-05-11 NOTE — Telephone Encounter (Signed)
Message copied by Wandalee Ferdinand on Wed May 11, 2012 12:37 PM ------      Message from: Ladene Artist      Created: Tue May 10, 2012  9:40 PM       Please call patient, same coumadin, check PT 1 month

## 2012-05-11 NOTE — Telephone Encounter (Signed)
Left message-same coumadin dose and recheck as scheduled.

## 2012-05-19 ENCOUNTER — Encounter: Payer: Self-pay | Admitting: Vascular Surgery

## 2012-05-20 ENCOUNTER — Ambulatory Visit (INDEPENDENT_AMBULATORY_CARE_PROVIDER_SITE_OTHER): Payer: Medicare Other | Admitting: Vascular Surgery

## 2012-05-20 ENCOUNTER — Encounter (INDEPENDENT_AMBULATORY_CARE_PROVIDER_SITE_OTHER): Payer: Medicare Other | Admitting: *Deleted

## 2012-05-20 ENCOUNTER — Encounter: Payer: Self-pay | Admitting: Vascular Surgery

## 2012-05-20 ENCOUNTER — Other Ambulatory Visit (INDEPENDENT_AMBULATORY_CARE_PROVIDER_SITE_OTHER): Payer: Medicare Other | Admitting: *Deleted

## 2012-05-20 VITALS — BP 160/60 | HR 63 | Ht 64.0 in | Wt 224.9 lb

## 2012-05-20 DIAGNOSIS — I82509 Chronic embolism and thrombosis of unspecified deep veins of unspecified lower extremity: Secondary | ICD-10-CM

## 2012-05-20 DIAGNOSIS — Z48812 Encounter for surgical aftercare following surgery on the circulatory system: Secondary | ICD-10-CM

## 2012-05-20 DIAGNOSIS — I6529 Occlusion and stenosis of unspecified carotid artery: Secondary | ICD-10-CM | POA: Insufficient documentation

## 2012-05-20 DIAGNOSIS — M79609 Pain in unspecified limb: Secondary | ICD-10-CM

## 2012-05-20 NOTE — Progress Notes (Signed)
[facility] [place]  Established Carotid Patient  History of Present Illness  Wendy Cole is a 76 y.o. (Apr 22, 1936) female who presents with chief complaint: routine follow-up.  Pt is s/p R CEA (01/12/12) and L CEA (11/19/11).  The patient has had no stroke sx.  She also denies any significant L leg swelling despite previous DVT in left leg.  Past Medical History, Past Surgical History, Social History, Family History, Medications, Allergies, and Review of Systems are unchanged from previous evaluation on 01/29/12.  Physical Examination  Filed Vitals:   05/20/12 1449  BP: 160/60  Pulse: 63  Height: 5\' 4"  (1.626 m)  Weight: 224 lb 14.4 oz (102.014 kg)  SpO2: 100%   Body mass index is 38.60 kg/(m^2).  General: A&O x 3, WDWN  Neck: incision well healed bilaterally  Eyes: PERRLA, EOMI  Pulmonary: Sym exp, good air movt, CTAB, no rales, rhonchi, & wheezing  Cardiac: RRR, Nl S1, S2, no Murmurs, rubs or gallops  Vascular: Vessel Right Left  Radial Palpable Palpable  Ulnar Palpable Palpable  Brachial Palpable Palpable  Carotid Palpable, without bruit Palpable, without bruit  Aorta Not palpable due to obesity N/A  Femoral Palpable Palpable  Popliteal Not palpable Not palpable  PT Faintly Palpable Faintly Palpable  DP Not Palpable Not Palpable   Gastrointestinal: soft, NTND, -G/R, - HSM, - masses, - CVAT B, obese  Musculoskeletal: M/S 5/5 throughout , Extremities without ischemic changes , no edema B, BLE spider veins  Neurologic: CN 2-12 intact , Pain and light touch intact in extremities , Motor exam as listed above  Non-Invasive Vascular Imaging  CAROTID DUPLEX (Date: 05/20/12):   R ICA stenosis: widely patent   R VA: patent and antegrade  L ICA stenosis: 40-59%, hyperplasia at distal end of patch  L VA: patent and antegrade  LLE Venous Duplex (Date: 05/20/12)  Limited study to patient poor tolerance of compression  No acute  DVT  Left femoral chronic non-occlusive thrombus  Left pop reflux   Medical Decision Making  Wendy Cole is a 76 y.o. female who presents with: s/p B CEA, LLE chronic DVT   Based on the patient's vascular studies and examination, I have offered the patient: annual B carotid duplex to evaluate for restenosis.  Pt is already on Coumadin for the LLE chronic DVT.  I discussed in depth with the patient the nature of atherosclerosis, and emphasized the importance of maximal medical management including strict control of blood pressure, blood glucose, and lipid levels, antiplatelet agents, obtaining regular exercise, and cessation of smoking.  The patient is aware that without maximal medical management the underlying atherosclerotic disease process will progress, limiting the benefit of any interventions.  Thank you for allowing [place] to participate in this patient's care.  Leonides Sake, MD [facility] [place] Office: [phone] Pager: [phone]  05/20/2012, 3:42 PM

## 2012-05-23 NOTE — Addendum Note (Signed)
Addended by: Sharee Pimple on: 05/23/2012 07:59 AM   Modules accepted: Orders

## 2012-06-14 ENCOUNTER — Other Ambulatory Visit (HOSPITAL_BASED_OUTPATIENT_CLINIC_OR_DEPARTMENT_OTHER): Payer: Medicare Other | Admitting: Lab

## 2012-06-14 DIAGNOSIS — Z86718 Personal history of other venous thrombosis and embolism: Secondary | ICD-10-CM

## 2012-06-14 LAB — PROTIME-INR
INR: 2.5 (ref 2.00–3.50)
Protime: 30 Seconds — ABNORMAL HIGH (ref 10.6–13.4)

## 2012-06-15 ENCOUNTER — Telehealth: Payer: Self-pay | Admitting: *Deleted

## 2012-06-15 NOTE — Telephone Encounter (Signed)
Husband notified to continue same coumadin dose. See [facility] flowsheet

## 2012-06-15 NOTE — Telephone Encounter (Signed)
Message copied by Wandalee Ferdinand on Wed Jun 15, 2012 12:14 PM ------      Message from: Ladene Artist      Created: Tue Jun 14, 2012  8:50 PM       Please call patient, same coumadin, check PT 1 mo.

## 2012-06-24 ENCOUNTER — Encounter: Payer: Self-pay | Admitting: Cardiovascular Disease

## 2012-06-24 ENCOUNTER — Ambulatory Visit (INDEPENDENT_AMBULATORY_CARE_PROVIDER_SITE_OTHER): Payer: Medicare Other | Admitting: Cardiovascular Disease

## 2012-06-24 VITALS — BP 121/52 | HR 56 | Wt 222.0 lb

## 2012-06-24 DIAGNOSIS — I639 Cerebral infarction, unspecified: Secondary | ICD-10-CM

## 2012-06-24 DIAGNOSIS — I480 Paroxysmal atrial fibrillation: Secondary | ICD-10-CM

## 2012-06-24 DIAGNOSIS — E785 Hyperlipidemia, unspecified: Secondary | ICD-10-CM

## 2012-06-24 DIAGNOSIS — I5032 Chronic diastolic (congestive) heart failure: Secondary | ICD-10-CM

## 2012-06-24 DIAGNOSIS — I6350 Cerebral infarction due to unspecified occlusion or stenosis of unspecified cerebral artery: Secondary | ICD-10-CM

## 2012-06-24 DIAGNOSIS — I4891 Unspecified atrial fibrillation: Secondary | ICD-10-CM

## 2012-06-24 NOTE — Patient Instructions (Signed)
Your physician wants you to follow-up in:  6 MONTHS WITH DR NISHAN  You will receive a reminder letter in the mail two months in advance. If you don't receive a letter, please call our office to schedule the follow-up appointment. Your physician recommends that you continue on your current medications as directed. Please refer to the Current Medication list given to you today. 

## 2012-06-24 NOTE — Assessment & Plan Note (Signed)
Secondary to PAF and bilateral carotid disease S/P CEA  Duplex at Chen's office ok  Continue coumadin in NSR

## 2012-06-24 NOTE — Assessment & Plan Note (Signed)
Cholesterol is at goal.  Continue current dose of statin and diet Rx.  No myalgias or side effects.  F/U  LFT's in 6 months. Lab Results  Component Value Date   LDLCALC  Value: 154        Total Cholesterol/HDL:CHD Risk Coronary Heart Disease Risk Table                     Men   Women  1/2 Average Risk   3.4   3.3  Average Risk       5.0   4.4  2 X Average Risk   9.6   7.1  3 X Average Risk  23.4   11.0        Use the calculated Patient Ratio above and the CHD Risk Table to determine the patient's CHD Risk.        ATP III CLASSIFICATION (LDL):  <100     mg/dL   Optimal  100-129  mg/dL   Near or Above                    Optimal  130-159  mg/dL   Borderline  160-189  mg/dL   High  >190     mg/dL   Very High* 02/01/2009             

## 2012-06-24 NOTE — Assessment & Plan Note (Signed)
Maint NSR with no palpitations  

## 2012-06-24 NOTE — Progress Notes (Signed)
[name], female   DOB: 1935/11/14, 77 y.o.   MRN: [id number] Wendy Cole is a 77 year old female with a history of heart failure NICM, PAF, OSA, HTN, prior DVT, renal insufficiency . Carotid duplex on 11/10/11 showed bilateral 80-99% ICA stenosis and she is post bilateral CEA;s She is normally seen by Dr [place] and saw Dr Tenny Craw in consultation at the hospital 6/26 for post op hypertension. She had a heart cath 07/13/11  She developed acute shortness of breath and came to the hospital where she was in flash pulmonary edema. She was on them overloaded and admitted for further evaluation and treatment.  She was diuresed with Demadex and lost 6 pounds during her hospital stay. As her volume status improved her shortness of breath improved. She has a history of chronic kidney disease with a creatinine between 1.4 and 2.0. Her BUN and creatinine worsened with diuresis but her labs were reviewed and it was felt her creatinine would need to be in this range for her volume status to be stable. She admitted to dietary indiscretion and noncompliance with medications. She was encouraged to stick to a 2 g sodium, heart healthy diet. She was also encouraged to take her diuretic on a daily basis to weigh herself daily to track her weight.  There was concern at that inadequately treated hypertension was a cause for her flash pulmonary edema. She had hydralazine and nitrates added to her medication regimen and tolerated these medications. Because of her renal insufficiency, she is not on an ACE inhibitor or an ARB. She is on a beta blocker. She has a history of obstructive sleep apnea and CPAP was ordered for her but she refused while in the hospital. Her blood sugars were monitored while she was in the hospital and they were stable. Her INR was also monitored and her Coumadin level is therapeutic.  Echo 2/13 reviewed  Study Conclusions  - Left ventricle: Systolic function was severely reduced. The  estimated ejection fraction was in the range of 25% to 30%. Features are consistent with a pseudonormal left ventricular filling pattern, with concomitant abnormal relaxation and increased filling pressure (grade 2 diastolic dysfunction). Doppler parameters are consistent with elevated mean left atrial filling pressure. - Regional wall motion abnormality: Akinesis of the basal-mid anteroseptal myocardium; hypokinesis of the entire anterior, mid inferolateral, basal-mid anterolateral, and apical lateral myocardium. - Aortic valve: Mild regurgitation. - Mitral valve: Mild regurgitation. - Left atrium: The atrium was moderately dilated. - Right atrium: The atrium was mildly dilated. - Pulmonary arteries: PA peak pressure: 49mm Hg (S). Impressions:  - The right ventricular systolic pressure was increased consistent with mild pulmonary hypertension.  She is very frustated and depressed. She has anxiety and sleep apnea. She like to lay flat in bed and this causes more PND/orhtopnea.   ROS: Denies fever, malais, weight loss, blurry vision, decreased visual acuity, cough, sputum, SOB, hemoptysis, pleuritic pain, palpitaitons, heartburn, abdominal pain, melena, lower extremity edema, claudication, or rash.  All other systems reviewed and negative  General: Affect appropriate Healthy:  appears stated age HEENT: normal Neck supple with no adenopathy JVP normal S/P bilateral CEAls with bruits no thyromegaly Lungs clear with no wheezing and good diaphragmatic motion Heart:  S1/S2 no murmur, no rub, gallop or click PMI normal Abdomen: benighn, BS positve, no tenderness, no AAA no bruit.  No HSM or HJR Distal pulses intact with no bruits No edema Neuro non-focal Skin warm and dry No muscular weakness  Current Outpatient Prescriptions  Medication Sig Dispense Refill  . allopurinol (ZYLOPRIM) 100 MG tablet Take 100 mg by mouth daily.      [name] amLODipine (NORVASC) 5 MG tablet Take 5 mg by  mouth daily.       [name] anastrozole (ARIMIDEX) 1 MG tablet TAKE 1 TABLET DAILY  90 tablet  1  . brimonidine (ALPHAGAN) 0.15 % ophthalmic solution Place 1 drop into both eyes daily.       [name] buPROPion (WELLBUTRIN SR) 150 MG 12 hr tablet TAKE 1 TABLET BY MOUTH EVERY DAY  30 tablet  PRN  . calcium carbonate (CALCIUM 500) 1250 MG tablet Take 1 tablet by mouth daily.      . carvedilol (COREG) 12.5 MG tablet Take 2 tablets every morning and take 1 tablet every evening      . clorazepate (TRANXENE) 7.5 MG tablet TABLET BY MOUTH TWICE A DAY AS NEEDED FOR ANXIETY  30 tablet  5  . ferrous sulfate 325 (65 FE) MG tablet Take 325 mg by mouth daily with breakfast.      . Fluticasone-Salmeterol (ADVAIR) 100-50 MCG/DOSE AEPB Inhale 1 puff into the lungs every 12 (twelve) hours.      . hydrALAZINE (APRESOLINE) 25 MG tablet Take 1 tablet (25 mg total) by mouth 3 (three) times daily.  90 tablet  11  . isosorbide mononitrate (IMDUR) 60 MG 24 hr tablet Take 1 tablet (60 mg total) by mouth daily.  30 tablet  11  . levothyroxine (SYNTHROID, LEVOTHROID) 137 MCG tablet Take 137 mcg by mouth daily.      [name] omeprazole (PRILOSEC) 40 MG capsule Take 40 mg by mouth daily.       . prednisoLONE acetate (PRED FORTE) 1 % ophthalmic suspension Place 1 drop into both eyes daily as needed. For eye pain      . timolol (TIMOPTIC) 0.5 % ophthalmic solution Place 1 drop into both eyes 2 (two) times daily. Twice daily      . torsemide (DEMADEX) 10 MG tablet Take 10 mg by mouth daily.      . traMADol (ULTRAM) 50 MG tablet Take 1 tablet (50 mg total) by mouth at bedtime as needed. For sleep and pain  60 tablet  3  . vitamin D, CHOLECALCIFEROL, 400 UNITS tablet Take 400 Units by mouth daily.       [name] warfarin (COUMADIN) 1 MG tablet Take 2-3 mg by mouth as directed. Take 3 mg on Mondays, Wednesdays, and Fridays. Take 2 mg on Sunday, Tuesdays, Thursdays, and Saturdays.        Allergies  Amlodipine; Atorvastatin; Colesevelam; Lasix; Statins; and  Tape  Electrocardiogram:  05/04/12  SR rate 60 LVH Q in 3, F  Assessment and Plan

## 2012-06-24 NOTE — Assessment & Plan Note (Signed)
Doing much better on nitrates and hydralazine Dyspnea improved and no PND or orthopnea

## 2012-06-29 ENCOUNTER — Other Ambulatory Visit: Payer: Self-pay | Admitting: Oncology

## 2012-06-29 ENCOUNTER — Other Ambulatory Visit: Payer: Self-pay | Admitting: Internal Medicine

## 2012-07-03 ENCOUNTER — Other Ambulatory Visit: Payer: Self-pay | Admitting: Internal Medicine

## 2012-07-12 ENCOUNTER — Other Ambulatory Visit (HOSPITAL_BASED_OUTPATIENT_CLINIC_OR_DEPARTMENT_OTHER): Payer: Medicare Other

## 2012-07-12 ENCOUNTER — Other Ambulatory Visit: Payer: Self-pay | Admitting: Oncology

## 2012-07-12 DIAGNOSIS — Z86718 Personal history of other venous thrombosis and embolism: Secondary | ICD-10-CM

## 2012-07-12 LAB — PROTIME-INR
INR: 2.7 (ref 2.00–3.50)
Protime: 32.4 Seconds — ABNORMAL HIGH (ref 10.6–13.4)

## 2012-07-14 ENCOUNTER — Telehealth: Payer: Self-pay | Admitting: *Deleted

## 2012-07-14 NOTE — Telephone Encounter (Signed)
Message copied by Wandalee Ferdinand on Thu Jul 14, 2012  3:56 PM ------      Message from: Thornton Papas B      Created: Wed Jul 13, 2012  8:54 PM       Please call patient, same coumadin, check PT in mo. ------

## 2012-07-14 NOTE — Telephone Encounter (Signed)
Notified via VM of coumadin directions. See Doc Flowsheet

## 2012-07-18 ENCOUNTER — Other Ambulatory Visit: Payer: Self-pay | Admitting: Internal Medicine

## 2012-07-20 ENCOUNTER — Other Ambulatory Visit (INDEPENDENT_AMBULATORY_CARE_PROVIDER_SITE_OTHER): Payer: Medicare Other

## 2012-07-20 ENCOUNTER — Ambulatory Visit (INDEPENDENT_AMBULATORY_CARE_PROVIDER_SITE_OTHER): Payer: Medicare Other | Admitting: Internal Medicine

## 2012-07-20 ENCOUNTER — Encounter: Payer: Self-pay | Admitting: Internal Medicine

## 2012-07-20 VITALS — BP 130/58 | HR 80 | Temp 96.5°F | Resp 16 | Wt 223.0 lb

## 2012-07-20 DIAGNOSIS — M353 Polymyalgia rheumatica: Secondary | ICD-10-CM

## 2012-07-20 DIAGNOSIS — D649 Anemia, unspecified: Secondary | ICD-10-CM

## 2012-07-20 DIAGNOSIS — I658 Occlusion and stenosis of other precerebral arteries: Secondary | ICD-10-CM

## 2012-07-20 DIAGNOSIS — R7309 Other abnormal glucose: Secondary | ICD-10-CM

## 2012-07-20 DIAGNOSIS — M545 Low back pain, unspecified: Secondary | ICD-10-CM

## 2012-07-20 DIAGNOSIS — I4891 Unspecified atrial fibrillation: Secondary | ICD-10-CM

## 2012-07-20 LAB — HEPATIC FUNCTION PANEL
ALT: 12 U/L (ref 0–35)
AST: 16 U/L (ref 0–37)
Albumin: 3.7 g/dL (ref 3.5–5.2)
Alkaline Phosphatase: 72 U/L (ref 39–117)
Total Protein: 7 g/dL (ref 6.0–8.3)

## 2012-07-20 LAB — CBC WITH DIFFERENTIAL/PLATELET
Eosinophils Relative: 3.6 % (ref 0.0–5.0)
Lymphocytes Relative: 7.7 % — ABNORMAL LOW (ref 12.0–46.0)
Monocytes Relative: 9 % (ref 3.0–12.0)
Neutrophils Relative %: 79 % — ABNORMAL HIGH (ref 43.0–77.0)
Platelets: 404 10*3/uL — ABNORMAL HIGH (ref 150.0–400.0)
RBC: 3.38 Mil/uL — ABNORMAL LOW (ref 3.87–5.11)
WBC: 8.7 10*3/uL (ref 4.5–10.5)

## 2012-07-20 LAB — BASIC METABOLIC PANEL
CO2: 24 mEq/L (ref 19–32)
Chloride: 102 mEq/L (ref 96–112)
GFR: 25.56 mL/min — ABNORMAL LOW (ref >60.00)
Glucose, Bld: 102 mg/dL — ABNORMAL HIGH (ref 70–99)
Potassium: 5 mEq/L (ref 3.5–5.1)
Sodium: 133 mEq/L — ABNORMAL LOW (ref 135–145)

## 2012-07-20 MED ORDER — PREDNISOLONE 5 MG PO TABS: 15.0000 mg | ORAL_TABLET | Freq: Every day | ORAL | Status: DC

## 2012-07-20 NOTE — Assessment & Plan Note (Signed)
Re-start Prednisone  Potential benefits of a long term steroid  use as well as potential risks  and complications were explained to the patient and were aknowledged.  D/c Aleve

## 2012-07-20 NOTE — Assessment & Plan Note (Signed)
Doing well 

## 2012-07-20 NOTE — Assessment & Plan Note (Signed)
Continue with current prescription therapy as reflected on the Med list.  

## 2012-07-20 NOTE — Progress Notes (Signed)
Subjective:    HPI   F/u L carotid repair The patient presents for a follow-up of  chronic hypertension, chronic dyslipidemia, type 2 diabetes, h/o CHF controlled with medicines. C/o severe pain in thor back and B neck/head pain L>R; stiff - taking Aleve and Tramadol   Wt Readings from Last 3 Encounters:  07/20/12 223 lb (101.152 kg)  06/24/12 222 lb (100.699 kg)  05/20/12 224 lb 14.4 oz (102.014 kg)   BP Readings from Last 3 Encounters:  07/20/12 130/58  06/24/12 121/52  05/20/12 160/60    Review of Systems  Constitutional: Positive for fatigue. Negative for chills, activity change, appetite change and unexpected weight change.  HENT: Negative for congestion, mouth sores and sinus pressure.   Eyes: Negative for visual disturbance.  Respiratory: Positive for shortness of breath. Negative for cough and chest tightness.   Cardiovascular: Negative for chest pain.  Gastrointestinal: Negative for nausea, vomiting, abdominal pain, blood in stool and abdominal distention.  Genitourinary: Negative for frequency, difficulty urinating and vaginal pain.  Musculoskeletal: Positive for myalgias, back pain, arthralgias and gait problem. Negative for joint swelling.  Skin: Negative for pallor and rash.  Neurological: Negative for dizziness, tremors, weakness, numbness and headaches.  Psychiatric/Behavioral: Negative for suicidal ideas, confusion and sleep disturbance.        Objective:   Physical Exam  Constitutional: She appears well-developed. No distress.  Obese A little pale  HENT:  Head: Normocephalic.  Right Ear: External ear normal.  Left Ear: External ear normal.  Nose: Nose normal.  Mouth/Throat: Oropharynx is clear and moist.  Eyes: Conjunctivae are normal. Pupils are equal, round, and reactive to light. Right eye exhibits no discharge. Left eye exhibits no discharge.  Neck: Normal range of motion. Neck supple. No JVD present. No tracheal deviation present. No  thyromegaly present.  Cardiovascular: Normal rate, regular rhythm and normal heart sounds.   Pulmonary/Chest: No stridor. No respiratory distress. She has no wheezes.  Abdominal: Soft. Bowel sounds are normal. She exhibits no distension and no mass. There is no tenderness. There is no rebound and no guarding.  Musculoskeletal: She exhibits no edema and no tenderness.  Lymphadenopathy:    She has no cervical adenopathy.  Neurological: She displays normal reflexes. No cranial nerve deficit. She exhibits normal muscle tone. Coordination normal.  cane  Skin: No rash noted. No erythema.  Psychiatric: She has a normal mood and affect. Her behavior is normal. Judgment and thought content normal.    Lab Results  Component Value Date   WBC 11.9* 05/03/2012   HGB 9.4* 05/03/2012   HCT 30.5* 05/03/2012   PLT 347 05/03/2012   GLUCOSE 125* 05/09/2012   CHOL 221* 09/09/2011   TRIG 286.0* 09/09/2011   HDL 32.10* 09/09/2011   LDLDIRECT 144.6 09/09/2011   LDLCALC  Value: 154        Total Cholesterol/HDL:CHD Risk Coronary Heart Disease Risk Table                     Men   Women  1/2 Average Risk   3.4   3.3  Average Risk       5.0   4.4  2 X Average Risk   9.6   7.1  3 X Average Risk  23.4   11.0        Use the calculated Patient Ratio above and the CHD Risk Table to determine the patient's CHD Risk.        ATP III CLASSIFICATION (  LDL):  <100     mg/dL   Optimal  161-096  mg/dL   Near or Above                    Optimal  130-159  mg/dL   Borderline  045-409  mg/dL   High  >811     mg/dL   Very High* 02/06/7828   ALT 14 05/03/2012   AST 21 05/03/2012   NA 133* 05/09/2012   K 5.1 05/09/2012   CL 99 05/09/2012   CREATININE 1.9* 05/09/2012   BUN 35* 05/09/2012   CO2 24 05/09/2012   TSH 2.067 05/03/2012   INR 2.70 07/12/2012   HGBA1C 6.3 12/09/2011   MICROALBUR 3.1* 09/27/2006    A complex case      Assessment & Plan:

## 2012-07-21 ENCOUNTER — Telehealth: Payer: Self-pay | Admitting: *Deleted

## 2012-07-21 ENCOUNTER — Telehealth: Payer: Self-pay | Admitting: Internal Medicine

## 2012-07-21 MED ORDER — IRON POLYSACCH CMPLX-B12-FA 150-0.025-1 MG PO CAPS: 1.0000 | ORAL_CAPSULE | Freq: Every day | ORAL | Status: DC

## 2012-07-21 MED ORDER — OMEPRAZOLE 40 MG PO CPDR: 40.0000 mg | DELAYED_RELEASE_CAPSULE | Freq: Every day | ORAL | Status: DC

## 2012-07-21 NOTE — Telephone Encounter (Signed)
Pt's Hgb critical 7.7. I called pt- she denies black stools or any other symptoms. I advised her to hold any naproxen or aleve products for now.

## 2012-07-21 NOTE — Telephone Encounter (Signed)
See previous note

## 2012-07-21 NOTE — Telephone Encounter (Signed)
I called he pt - we discussed labs. She is feeling ok. Refused hospitalization/blood transfusion. We agreed on: Changing iron Rx Adding a PPI (Omeprazole) Moving up hem-onc appt CBC/iron in 1 week  Thx

## 2012-07-21 NOTE — Assessment & Plan Note (Addendum)
CBC  Hgb now is 7.7. She is on Coumadin. She was taking Aleve. She will need to be on Prednisone for ?PMR. I suggested the pt would go to [facility] ER for an admission/GI cons/RBC transfusion this am. The pt refused.  We agreed on:   Changing iron Rx  Adding a PPI (Omeprazole)  Moving up hem-onc appt  CBC/iron in 1 week F/u w/Dr Truett Perna sooner To ER if worse

## 2012-07-22 ENCOUNTER — Encounter: Payer: Self-pay | Admitting: Internal Medicine

## 2012-08-01 ENCOUNTER — Other Ambulatory Visit: Payer: Self-pay | Admitting: Internal Medicine

## 2012-08-09 ENCOUNTER — Other Ambulatory Visit: Payer: Medicare Other

## 2012-08-09 ENCOUNTER — Telehealth: Payer: Self-pay | Admitting: Oncology

## 2012-08-09 NOTE — Telephone Encounter (Signed)
Pt called and r/s lab from today to 08/12/12

## 2012-08-12 ENCOUNTER — Other Ambulatory Visit (HOSPITAL_BASED_OUTPATIENT_CLINIC_OR_DEPARTMENT_OTHER): Payer: Medicare Other | Admitting: Lab

## 2012-08-12 DIAGNOSIS — Z7901 Long term (current) use of anticoagulants: Secondary | ICD-10-CM

## 2012-08-12 DIAGNOSIS — Z5181 Encounter for therapeutic drug level monitoring: Secondary | ICD-10-CM

## 2012-08-12 DIAGNOSIS — Z86718 Personal history of other venous thrombosis and embolism: Secondary | ICD-10-CM

## 2012-08-12 LAB — PROTIME-INR: Protime: 30 Seconds — ABNORMAL HIGH (ref 10.6–13.4)

## 2012-08-15 ENCOUNTER — Telehealth: Payer: Self-pay | Admitting: *Deleted

## 2012-08-15 NOTE — Telephone Encounter (Signed)
Message copied by Caleb Popp on Mon Aug 15, 2012  4:34 PM ------      Message from: Ladene Artist      Created: Sat Aug 13, 2012 10:18 AM       Please call patient, same coumadin, check PT 1 month ------

## 2012-08-15 NOTE — Telephone Encounter (Signed)
Left message on voicemail for pt to call office.  

## 2012-08-16 ENCOUNTER — Telehealth: Payer: Self-pay | Admitting: *Deleted

## 2012-08-16 NOTE — Telephone Encounter (Signed)
Message copied by Gala Romney on Tue Aug 16, 2012  4:18 PM ------      Message from: Ladene Artist      Created: Sat Aug 13, 2012 10:18 AM       Please call patient, same coumadin, check PT 1 month ------

## 2012-08-16 NOTE — Telephone Encounter (Signed)
Per Dr. Truett Perna; called and spoke with pt's husband and instructed pt to stay on same coumadin, recheck labs in 1 month.  Pt's husband verbalized understanding of instructions.

## 2012-09-08 ENCOUNTER — Telehealth: Payer: Self-pay | Admitting: Oncology

## 2012-09-08 ENCOUNTER — Other Ambulatory Visit (HOSPITAL_BASED_OUTPATIENT_CLINIC_OR_DEPARTMENT_OTHER): Payer: Medicare Other | Admitting: Lab

## 2012-09-08 ENCOUNTER — Ambulatory Visit (HOSPITAL_BASED_OUTPATIENT_CLINIC_OR_DEPARTMENT_OTHER): Payer: Medicare Other | Admitting: Oncology

## 2012-09-08 VITALS — BP 187/57 | HR 59 | Temp 96.8°F | Resp 20 | Ht 64.0 in | Wt 226.8 lb

## 2012-09-08 DIAGNOSIS — D649 Anemia, unspecified: Secondary | ICD-10-CM

## 2012-09-08 DIAGNOSIS — Z86718 Personal history of other venous thrombosis and embolism: Secondary | ICD-10-CM

## 2012-09-08 DIAGNOSIS — Z853 Personal history of malignant neoplasm of breast: Secondary | ICD-10-CM

## 2012-09-08 LAB — PROTIME-INR
INR: 3.2 (ref 2.00–3.50)
Protime: 38.4 Seconds — ABNORMAL HIGH (ref 10.6–13.4)

## 2012-09-08 NOTE — Telephone Encounter (Signed)
gve the pt her may-oct 2014 appt calendar. °

## 2012-09-08 NOTE — Progress Notes (Signed)
   [facility]    OFFICE PROGRESS NOTE   INTERVAL HISTORY:   She returns as scheduled. She continues Arimidex. No hot flashes. Mild stiffness. No change at the right chest wall or left breast. She has intermittent discomfort at the left calf. She continues Coumadin anticoagulation.  Objective:  Vital signs in last 24 hours:  Blood pressure 187/57, pulse 59, temperature 96.8 F (36 C), temperature source Oral, resp. rate 20, height 5\' 4"  (1.626 m), weight 226 lb 12.8 oz (102.876 kg).    HEENT: Neck without mass Lymphatics: No cervical, supraclavicular, or axillary nodes Resp: Lungs clear bilaterally Cardio: Regular rate and rhythm GI: No hepatomegaly Vascular: Trace edema at the left lower leg with venous varicosities. No erythema.  Breast: Status post right mastectomy. No evidence for chest wall tumor recurrence. Tenderness around the lateral aspect of the right mastectomy incision extending into the axilla. Left breast without mass.   Lab Results:   PT/INR-3.2   Medications: I have reviewed the patient's current medications.  Assessment/Plan: 1. Extensive left lower extremity deep vein thrombosis. Maintained on anticoagulation therapy since she was diagnosed with a deep venous thrombosis, 01/31/2009.  a. She was treated with Lovenox followed by heparin anticoagulation while hospitalized, and she was then maintained on Lovenox prior to surgery on 03/02/2009.  b. Now maintained on therapeutic Coumadin anticoagulation.  2. Diagnosed with synchronous T1, ER positive, PR positive, and HER-2 negative right-sided breast cancer on 01/09/2009. a. Status post a right mastectomy and sentinel lymph node biopsy on 03/22/2009 with the pathology confirming two foci of invasive carcinoma measuring 0.6 and 0.4 cm. The invasive tumor was grade 1 with associated low-grade ductal carcinoma in situ. No lymphovascular invasion was identified, and three right-sided sentinel lymph  nodes were negative for metastatic carcinoma.  b. Initiation of adjuvant Arimidex on 04/05/2009. 3. Chronic obstructive pulmonary disease. 4. History of a cerebrovascular accident. 5. Gout. 6. Obstructive sleep apnea. 7. History of renal insufficiency. 8. Anemia-severe microcytic anemia noted on 07/20/2012-she is scheduled for a followup CBC at [facility] 4/18 9. Admission with "flash "pulmonary edema in February of 2013. 10. Left carotid endarterectomy June 2013, right carotid endarterectomy August 2013   Disposition:  She remains in clinical remission from breast cancer. She will continue Arimidex. She will schedule a mammogram for October of 2014. She will return for an office visit in 6 months. She is scheduled for a monthly PT check.  Ms. Brach was found to have severe anemia in February. She is scheduled for a repeat CBC tomorrow and will followup with Dr. Posey Rea. I suspect she has iron deficiency anemia based on the low MCV and high platelet count in February. She should be referred to gastroenterology if a diagnosis of iron deficiency anemia is confirmed. I will be glad to help with management of the anemia as needed.   Thornton Papas, MD  09/08/2012  3:39 PM

## 2012-09-12 ENCOUNTER — Ambulatory Visit (INDEPENDENT_AMBULATORY_CARE_PROVIDER_SITE_OTHER): Payer: Medicare Other | Admitting: Internal Medicine

## 2012-09-12 ENCOUNTER — Encounter: Payer: Self-pay | Admitting: *Deleted

## 2012-09-12 ENCOUNTER — Other Ambulatory Visit (INDEPENDENT_AMBULATORY_CARE_PROVIDER_SITE_OTHER): Payer: Medicare Other

## 2012-09-12 ENCOUNTER — Encounter: Payer: Self-pay | Admitting: Internal Medicine

## 2012-09-12 VITALS — BP 110/60 | HR 68 | Temp 98.1°F | Resp 16 | Wt 222.0 lb

## 2012-09-12 DIAGNOSIS — M353 Polymyalgia rheumatica: Secondary | ICD-10-CM

## 2012-09-12 DIAGNOSIS — E119 Type 2 diabetes mellitus without complications: Secondary | ICD-10-CM

## 2012-09-12 DIAGNOSIS — I4891 Unspecified atrial fibrillation: Secondary | ICD-10-CM

## 2012-09-12 DIAGNOSIS — N189 Chronic kidney disease, unspecified: Secondary | ICD-10-CM

## 2012-09-12 DIAGNOSIS — D649 Anemia, unspecified: Secondary | ICD-10-CM

## 2012-09-12 DIAGNOSIS — M545 Low back pain: Secondary | ICD-10-CM

## 2012-09-12 DIAGNOSIS — M109 Gout, unspecified: Secondary | ICD-10-CM

## 2012-09-12 DIAGNOSIS — I480 Paroxysmal atrial fibrillation: Secondary | ICD-10-CM

## 2012-09-12 LAB — BASIC METABOLIC PANEL
BUN: 30 mg/dL — ABNORMAL HIGH (ref 6–23)
Calcium: 8.8 mg/dL (ref 8.4–10.5)
Creatinine, Ser: 1.7 mg/dL — ABNORMAL HIGH (ref 0.4–1.2)
GFR: 31.61 mL/min — ABNORMAL LOW (ref >60.00)
Glucose, Bld: 96 mg/dL (ref 70–99)
Sodium: 137 mEq/L (ref 135–145)

## 2012-09-12 LAB — CBC WITH DIFFERENTIAL/PLATELET
Basophils Absolute: 0.1 10*3/uL (ref 0.0–0.1)
Eosinophils Absolute: 0.3 10*3/uL (ref 0.0–0.7)
HCT: 29.6 % — ABNORMAL LOW (ref 36.0–46.0)
Lymphs Abs: 0.8 10*3/uL (ref 0.7–4.0)
Monocytes Absolute: 0.7 10*3/uL (ref 0.1–1.0)
Monocytes Relative: 7.1 % (ref 3.0–12.0)
Platelets: 321 10*3/uL (ref 150.0–400.0)
RDW: 22.3 % — ABNORMAL HIGH (ref 11.5–14.6)

## 2012-09-12 LAB — IBC PANEL: Iron: 27 ug/dL — ABNORMAL LOW (ref 42–145)

## 2012-09-12 MED ORDER — PREDNISOLONE 5 MG PO TABS: 15.0000 mg | ORAL_TABLET | Freq: Every day | ORAL | Status: DC

## 2012-09-12 NOTE — Assessment & Plan Note (Signed)
Continue with current prescription therapy as reflected on the Med list.  

## 2012-09-12 NOTE — Progress Notes (Signed)
Subjective:    HPI   F/u LBP The patient presents for a follow-up of  chronic hypertension, chronic dyslipidemia, type 2 diabetes, h/o CHF controlled with medicines. C/o severe pain in thor back and B neck/head pain L>R; stiff - taking Aleve and Tramadol   Wt Readings from Last 3 Encounters:  09/12/12 222 lb (100.699 kg)  09/08/12 226 lb 12.8 oz (102.876 kg)  07/20/12 223 lb (101.152 kg)   BP Readings from Last 3 Encounters:  09/12/12 110/60  09/08/12 187/57  07/20/12 130/58    Review of Systems  Constitutional: Positive for fatigue. Negative for chills, activity change, appetite change and unexpected weight change.  HENT: Negative for congestion, mouth sores and sinus pressure.   Eyes: Negative for visual disturbance.  Respiratory: Positive for shortness of breath. Negative for cough and chest tightness.   Cardiovascular: Negative for chest pain.  Gastrointestinal: Negative for nausea, vomiting, abdominal pain, blood in stool and abdominal distention.  Genitourinary: Negative for frequency, difficulty urinating and vaginal pain.  Musculoskeletal: Positive for myalgias, back pain, arthralgias and gait problem. Negative for joint swelling.  Skin: Negative for pallor and rash.  Neurological: Negative for dizziness, tremors, weakness, numbness and headaches.  Psychiatric/Behavioral: Negative for suicidal ideas, confusion and sleep disturbance.        Objective:   Physical Exam  Constitutional: She appears well-developed. No distress.  Obese A little pale  HENT:  Head: Normocephalic.  Right Ear: External ear normal.  Left Ear: External ear normal.  Nose: Nose normal.  Mouth/Throat: Oropharynx is clear and moist.  Eyes: Conjunctivae are normal. Pupils are equal, round, and reactive to light. Right eye exhibits no discharge. Left eye exhibits no discharge.  Neck: Normal range of motion. Neck supple. No JVD present. No tracheal deviation present. No thyromegaly present.   Cardiovascular: Normal rate, regular rhythm and normal heart sounds.   Pulmonary/Chest: No stridor. No respiratory distress. She has no wheezes.  Abdominal: Soft. Bowel sounds are normal. She exhibits no distension and no mass. There is no tenderness. There is no rebound and no guarding.  Musculoskeletal: She exhibits no edema and no tenderness.  Lymphadenopathy:    She has no cervical adenopathy.  Neurological: She displays normal reflexes. No cranial nerve deficit. She exhibits normal muscle tone. Coordination normal.  cane  Skin: No rash noted. No erythema.  Psychiatric: She has a normal mood and affect. Her behavior is normal. Judgment and thought content normal.    Lab Results  Component Value Date   WBC 8.7 07/20/2012   HGB 7.7 Repeated and verified X2.* 07/20/2012   HCT 24.9 Repeated and verified X2.* 07/20/2012   PLT 404.0* 07/20/2012   GLUCOSE 102* 07/20/2012   CHOL 221* 09/09/2011   TRIG 286.0* 09/09/2011   HDL 32.10* 09/09/2011   LDLDIRECT 144.6 09/09/2011   LDLCALC  Value: 154        Total Cholesterol/HDL:CHD Risk Coronary Heart Disease Risk Table                     Men   Women  1/2 Average Risk   3.4   3.3  Average Risk       5.0   4.4  2 X Average Risk   9.6   7.1  3 X Average Risk  23.4   11.0        Use the calculated Patient Ratio above and the CHD Risk Table to determine the patient's CHD Risk.  ATP III CLASSIFICATION (LDL):  <100     mg/dL   Optimal  147-829  mg/dL   Near or Above                    Optimal  130-159  mg/dL   Borderline  562-130  mg/dL   High  >865     mg/dL   Very High* 7/84/6962   ALT 12 07/20/2012   AST 16 07/20/2012   NA 133* 07/20/2012   K 5.0 07/20/2012   CL 102 07/20/2012   CREATININE 2.0* 07/20/2012   BUN 37* 07/20/2012   CO2 24 07/20/2012   TSH 3.81 07/20/2012   INR 3.20 09/08/2012   HGBA1C 6.1 07/20/2012   MICROALBUR 3.1* 09/27/2006         Assessment & Plan:

## 2012-09-13 ENCOUNTER — Ambulatory Visit (INDEPENDENT_AMBULATORY_CARE_PROVIDER_SITE_OTHER): Payer: Medicare Other | Admitting: Internal Medicine

## 2012-09-13 ENCOUNTER — Other Ambulatory Visit (INDEPENDENT_AMBULATORY_CARE_PROVIDER_SITE_OTHER): Payer: Medicare Other

## 2012-09-13 ENCOUNTER — Encounter: Payer: Self-pay | Admitting: Internal Medicine

## 2012-09-13 ENCOUNTER — Ambulatory Visit (INDEPENDENT_AMBULATORY_CARE_PROVIDER_SITE_OTHER)
Admission: RE | Admit: 2012-09-13 | Discharge: 2012-09-13 | Disposition: A | Discharge status: Home / Self Care | Payer: Medicare Other | Source: Ambulatory Visit | Attending: Internal Medicine | Admitting: Internal Medicine

## 2012-09-13 VITALS — BP 136/66 | HR 63 | Ht 64.0 in | Wt 228.2 lb

## 2012-09-13 DIAGNOSIS — J449 Chronic obstructive pulmonary disease, unspecified: Secondary | ICD-10-CM

## 2012-09-13 DIAGNOSIS — R0609 Other forms of dyspnea: Secondary | ICD-10-CM

## 2012-09-13 DIAGNOSIS — I509 Heart failure, unspecified: Secondary | ICD-10-CM

## 2012-09-13 DIAGNOSIS — G4733 Obstructive sleep apnea (adult) (pediatric): Secondary | ICD-10-CM

## 2012-09-13 DIAGNOSIS — R0600 Dyspnea, unspecified: Secondary | ICD-10-CM

## 2012-09-13 DIAGNOSIS — J810 Acute pulmonary edema: Secondary | ICD-10-CM

## 2012-09-13 DIAGNOSIS — R0989 Other specified symptoms and signs involving the circulatory and respiratory systems: Secondary | ICD-10-CM

## 2012-09-13 MED ORDER — ALBUTEROL SULFATE HFA 108 (90 BASE) MCG/ACT IN AERS: 2.0000 | INHALATION_SPRAY | Freq: Four times a day (QID) | RESPIRATORY_TRACT | Status: DC | PRN

## 2012-09-13 NOTE — Patient Instructions (Addendum)
Script sent for albuterol HFA rescue inhaler to use for sudden shortness of breath  The Dulera and Advair are similar products, both designed as maintenance controller inhalers for steady, every day use. Use one or the other.  Schedule PFT  Dx COPD  Order- CXR-    Dx COPD, CHF              Lab-  BNP Dx dyspnea

## 2012-09-13 NOTE — Progress Notes (Signed)
Subjective:    [name], female    DOB: [birth year], 77 y.o.   MRN: [id number]  HPI 10/17/10- 8 yoF former smoker followed for OSA and COPD, complicated by DM, hx breast cancer, hx DVT. Here with husband. Last here Oct 17, 2009- note reviewed.  She denies any significant breathing issues since last here. Continues to use CPAP at 10 all night every night. Never recurrence of DVT left leg.  Noted minor cough when first lying down- they are not concerned.   10/22/11- 40 yoF former smoker followed for OSA and COPD, complicated by DM, hx breast cancer, hx DVT.... Here with husband. Denies any SOB, wheezing, cough, or congestion. Wears CPAP every night for 5-6 hours approx. Stable dyspnea on exertion with hills and stairs. Hospitalized from February 18 through 26 with pulmonary edema, history AFib, chronic Coumadin, history of DVT. Ejection fraction 25-30%.  01/29/12- 82 yoF former smoker followed for OSA and COPD, complicated by DM, hx breast cancer, hx DVT.... Here with husband. ACUTE VISIT: Increased SOB and worse at night; get raspy breathing prior to SOB starting up. This episode made her feel like her stomach was going to pop. Hospital 8/20 through 01/13/2012-right carotid endarterectomy/Dr. Imogene Burn. Occasional episodes all summer described as onset of shortness of breath in the evening after dinner when she is reading or watching TV or it she starts clearing her throat and feels bloated in the abdomen which makes her more short of breath. Worse if lying down. Denies fever, purulent discharge, palpitation or chest pain. 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 CPAP all night every night. She has been mis- using Advair, saving it for her episodes of dyspnea, so she uses it once at night with 2 or 3 activations. I reeducated her on use as a maintenance inhaler.  03/15/12- 27 yoF former smoker followed for OSA and  COPD, complicated by DM, hx breast cancer, hx DVT.... Here with husband.  Pt states that SOB has improved since last OV. No complaints She feels well and is walking more. Recognizes fluid retention causes shortness of breath. Her diuretic therapy helps. Pleased with CPAP 10/Advanced used all night every night. CXR 02/02/12-reviewed with her IMPRESSION:  Cardiomegaly. COPD/chronic changes. No active disease.  Original Report Authenticated By: Cyndie Chime, M.D.   09/13/12- 32 yoF former smoker followed for OSA and COPD, complicated by DM, hx breast cancer, hx DVT.... Here with husband. FOLLOWS FOR:SOB at times and feelings of not being able to breathe-denies this happens with activity; states she goes to bed reading and about 30 minutes later she cant breathe and feels congestion. CPAP 10/ Advanced Is positional dyspnea happens about one time a month and is relieved by sitting up and waiting for it to go away.intends to start before she puts her CPAP on. She has been using either Dulera or Advair intermittently as rescue inhalers which we discussed. She emphasizes that she gets acutely short of breath and will grab for anything. She wasn't satisfied to discuss the difference between maintenance and rescue inhalers. She is being worked up for anemia. Glaucoma is treated with Timoptic which may cause bronchospasm. History of diastolic heart failure followed by cardiology. CXR 05/03/12 IMPRESSION:  Cardiac enlargement with pulmonary vascular congestion and  interstitial edema similar to previous study.  Original Report Authenticated By: Burman Nieves, M.D.  ROS-see HPI Constitutional:   No-   weight loss, night sweats, fevers,  chills, fatigue, lassitude. HEENT:   No-  headaches, difficulty swallowing, tooth/dental problems, sore throat,       No-  sneezing, itching, ear ache, nasal congestion, post nasal drip,  CV:  No-   chest pain, +orthopnea, +PND, swelling in lower extremities, anasarca,  dizziness, palpitations Resp: +shortness of breath with exertion or at rest.              No-   productive cough,  No non-productive cough,  No- coughing up of blood.              No-   change in color of mucus.  No- wheezing.   Skin: No-   rash or lesions. GI:  No-   heartburn, indigestion, abdominal pain, nausea, vomiting,  GU:  MS:  No-   joint pain or swelling.   Neuro-     nothing unusual Psych:  No- change in mood or affect. No depression or anxiety.  No memory loss.  OBJ- Physical Exam General- Alert, Oriented, Affect-appropriate, Distress- none acute, overweight Skin- rash-none, lesions- none, excoriation- none Lymphadenopathy- none Head- atraumatic            Eyes- Gross vision intact, PERRLA, conjunctivae and secretions clear            Ears- Hearing, canals-normal            Nose- Clear, no-Septal dev, mucus, polyps, erosion, perforation             Throat- Mallampati II , mucosa clear , drainage- none, tonsils- atrophic Neck- flexible , trachea midline, no stridor , thyroid nl, + R carotid scar Chest - symmetrical excursion , unlabored           Heart/CV- RRR , +2/6 systolic murmur , no gallop  , no rub, nl s1 s2                           - JVD- none , edema+trace, stasis changes- none, varices- none           Lung- clear to P&A, wheeze- none, cough- none , dullness-none, rub- none           Chest wall-  Abd-  Br/ Gen/ Rectal- Not done, not indicated Extrem- cyanosis- none, clubbing, none, atrophy- none, strength- nl Neuro- grossly intact to observation

## 2012-09-15 ENCOUNTER — Telehealth: Payer: Self-pay | Admitting: Internal Medicine

## 2012-09-15 MED ORDER — ALBUTEROL SULFATE HFA 108 (90 BASE) MCG/ACT IN AERS: 2.0000 | INHALATION_SPRAY | Freq: Four times a day (QID) | RESPIRATORY_TRACT | Status: AC | PRN

## 2012-09-15 NOTE — Telephone Encounter (Signed)
I have called the Rx into pharmacy message line. Nothing more needed at this time. Will sign off on message.

## 2012-09-15 NOTE — Progress Notes (Signed)
Quick Note:  Pt aware of results. ______ 

## 2012-09-19 NOTE — Assessment & Plan Note (Signed)
She describes good compliance and control. The paroxysmal nocturnal dyspnea episodes do not seem to happen when she is wearing CPAP.

## 2012-09-19 NOTE — Assessment & Plan Note (Signed)
I think her occasional episodes of paroxysmal nocturnal dyspnea are probably flash pulmonary edema related to orthopnea and rapid onset chest congestion. Plan-schedule PFT. Educated to use either Dulera or Advair but not both and use them as maintenance medicines. Prescription for rescue inhaler to try. Labs today for BNP, BMET

## 2012-09-20 ENCOUNTER — Ambulatory Visit: Payer: Medicare Other | Admitting: Internal Medicine

## 2012-10-02 ENCOUNTER — Other Ambulatory Visit: Payer: Self-pay | Admitting: Internal Medicine

## 2012-10-04 ENCOUNTER — Other Ambulatory Visit: Payer: Self-pay | Admitting: Internal Medicine

## 2012-10-06 ENCOUNTER — Other Ambulatory Visit (HOSPITAL_BASED_OUTPATIENT_CLINIC_OR_DEPARTMENT_OTHER): Payer: Medicare Other | Admitting: Lab

## 2012-10-06 DIAGNOSIS — D649 Anemia, unspecified: Secondary | ICD-10-CM

## 2012-10-06 DIAGNOSIS — Z86718 Personal history of other venous thrombosis and embolism: Secondary | ICD-10-CM

## 2012-10-06 LAB — PROTIME-INR
INR: 1.9 — ABNORMAL LOW (ref 2.00–3.50)
Protime: 22.8 Seconds — ABNORMAL HIGH (ref 10.6–13.4)

## 2012-10-06 LAB — CBC WITH DIFFERENTIAL/PLATELET
Basophils Absolute: 0.1 10*3/uL (ref 0.0–0.1)
EOS%: 3.2 % (ref 0.0–7.0)
HGB: 10.3 g/dL — ABNORMAL LOW (ref 11.6–15.9)
MCH: 25.6 pg (ref 25.1–34.0)
MCV: 81.9 fL (ref 79.5–101.0)
MONO%: 6.7 % (ref 0.0–14.0)
NEUT%: 80.5 % — ABNORMAL HIGH (ref 38.4–76.8)
RDW: 18.6 % — ABNORMAL HIGH (ref 11.2–14.5)

## 2012-10-10 ENCOUNTER — Other Ambulatory Visit: Payer: Self-pay | Admitting: Oncology

## 2012-10-10 ENCOUNTER — Telehealth: Payer: Self-pay | Admitting: *Deleted

## 2012-10-10 DIAGNOSIS — C50911 Malignant neoplasm of unspecified site of right female breast: Secondary | ICD-10-CM

## 2012-10-10 NOTE — Telephone Encounter (Signed)
Called pt to let her know that hgb is better on iron. Continue to take same iron and coumadin. Pt states she has an appt to see Dr Posey Rea and also is due for colonscopy with Dr Madilyn Fireman (has not scheduled it yet)

## 2012-11-03 ENCOUNTER — Other Ambulatory Visit (HOSPITAL_BASED_OUTPATIENT_CLINIC_OR_DEPARTMENT_OTHER): Payer: Medicare Other | Admitting: Lab

## 2012-11-03 DIAGNOSIS — Z86718 Personal history of other venous thrombosis and embolism: Secondary | ICD-10-CM

## 2012-11-03 LAB — PROTIME-INR: Protime: 33.6 Seconds — ABNORMAL HIGH (ref 10.6–13.4)

## 2012-11-10 ENCOUNTER — Telehealth: Payer: Self-pay | Admitting: *Deleted

## 2012-11-10 DIAGNOSIS — Z86718 Personal history of other venous thrombosis and embolism: Secondary | ICD-10-CM

## 2012-11-10 NOTE — Telephone Encounter (Signed)
Notified pt stay on same dose of coumadin and will re-check PT in 1 month.  Pt verbalized understanding.

## 2012-11-10 NOTE — Telephone Encounter (Signed)
Message copied by Wilkie Aye Jousha Schwandt P on Thu Nov 10, 2012 11:19 AM ------      Message from: Thornton Papas B      Created: Thu Nov 03, 2012  9:13 PM       Please call patient, same coumadin , check PT in 45mo. ------

## 2012-11-11 ENCOUNTER — Encounter: Payer: Self-pay | Admitting: Internal Medicine

## 2012-11-11 ENCOUNTER — Ambulatory Visit (INDEPENDENT_AMBULATORY_CARE_PROVIDER_SITE_OTHER): Payer: Medicare Other | Admitting: Internal Medicine

## 2012-11-11 VITALS — BP 130/70 | HR 80 | Temp 96.5°F | Resp 16 | Wt 224.0 lb

## 2012-11-11 DIAGNOSIS — M10079 Idiopathic gout, unspecified ankle and foot: Secondary | ICD-10-CM

## 2012-11-11 DIAGNOSIS — N12 Tubulo-interstitial nephritis, not specified as acute or chronic: Secondary | ICD-10-CM

## 2012-11-11 DIAGNOSIS — M5441 Lumbago with sciatica, right side: Secondary | ICD-10-CM

## 2012-11-11 DIAGNOSIS — I5032 Chronic diastolic (congestive) heart failure: Secondary | ICD-10-CM

## 2012-11-11 DIAGNOSIS — I6350 Cerebral infarction due to unspecified occlusion or stenosis of unspecified cerebral artery: Secondary | ICD-10-CM

## 2012-11-11 DIAGNOSIS — M109 Gout, unspecified: Secondary | ICD-10-CM

## 2012-11-11 DIAGNOSIS — I639 Cerebral infarction, unspecified: Secondary | ICD-10-CM

## 2012-11-11 DIAGNOSIS — E119 Type 2 diabetes mellitus without complications: Secondary | ICD-10-CM

## 2012-11-11 DIAGNOSIS — M5430 Sciatica, unspecified side: Secondary | ICD-10-CM

## 2012-11-11 DIAGNOSIS — D638 Anemia in other chronic diseases classified elsewhere: Secondary | ICD-10-CM

## 2012-11-11 DIAGNOSIS — M353 Polymyalgia rheumatica: Secondary | ICD-10-CM

## 2012-11-11 MED ORDER — CLORAZEPATE DIPOTASSIUM 7.5 MG PO TABS: 7.5000 mg | ORAL_TABLET | Freq: Two times a day (BID) | ORAL | Status: DC | PRN

## 2012-11-11 MED ORDER — TRAMADOL HCL 50 MG PO TABS: 50.0000 mg | ORAL_TABLET | Freq: Every evening | ORAL | Status: DC | PRN

## 2012-11-11 NOTE — Assessment & Plan Note (Signed)
Continue with current prescription therapy as reflected on the Med list.  

## 2012-11-11 NOTE — Assessment & Plan Note (Addendum)
ESR Restart Prednisone Risks associated with treatment noncompliance were discussed. Compliance was encouraged.

## 2012-11-11 NOTE — Assessment & Plan Note (Signed)
UA

## 2012-11-11 NOTE — Progress Notes (Signed)
Subjective:    HPI   F/u LBP The patient presents for a follow-up of  chronic hypertension, chronic dyslipidemia, type 2 diabetes, h/o CHF controlled with medicines. C/o severe pain in thor back and B neck/head pain L>R; stiff - taking Aleve and Tramadol C/o occ R temporal-occip HA   Wt Readings from Last 3 Encounters:  11/11/12 224 lb (101.606 kg)  09/13/12 228 lb 3.2 oz (103.511 kg)  09/12/12 222 lb (100.699 kg)   BP Readings from Last 3 Encounters:  11/11/12 130/70  09/13/12 136/66  09/12/12 110/60    Review of Systems  Constitutional: Positive for fatigue. Negative for chills, activity change, appetite change and unexpected weight change.  HENT: Negative for congestion, mouth sores and sinus pressure.   Eyes: Negative for visual disturbance.  Respiratory: Positive for shortness of breath. Negative for cough and chest tightness.   Cardiovascular: Negative for chest pain.  Gastrointestinal: Negative for nausea, vomiting, abdominal pain, blood in stool and abdominal distention.  Genitourinary: Negative for frequency, difficulty urinating and vaginal pain.  Musculoskeletal: Positive for myalgias, back pain, arthralgias and gait problem. Negative for joint swelling.  Skin: Negative for pallor and rash.  Neurological: Negative for dizziness, tremors, weakness, numbness and headaches.  Psychiatric/Behavioral: Negative for suicidal ideas, confusion and sleep disturbance.        Objective:   Physical Exam  Constitutional: She appears well-developed. No distress.  Obese A little pale  HENT:  Head: Normocephalic.  Right Ear: External ear normal.  Left Ear: External ear normal.  Nose: Nose normal.  Mouth/Throat: Oropharynx is clear and moist.  Eyes: Conjunctivae are normal. Pupils are equal, round, and reactive to light. Right eye exhibits no discharge. Left eye exhibits no discharge.  Neck: Normal range of motion. Neck supple. No JVD present. No tracheal deviation  present. No thyromegaly present.  Cardiovascular: Normal rate, regular rhythm and normal heart sounds.   Pulmonary/Chest: No stridor. No respiratory distress. She has no wheezes.  Abdominal: Soft. Bowel sounds are normal. She exhibits no distension and no mass. There is no tenderness. There is no rebound and no guarding.  Musculoskeletal: She exhibits no edema and no tenderness.  Lymphadenopathy:    She has no cervical adenopathy.  Neurological: She displays normal reflexes. No cranial nerve deficit. She exhibits normal muscle tone. Coordination normal.  cane  Skin: No rash noted. No erythema.  Psychiatric: She has a normal mood and affect. Her behavior is normal. Judgment and thought content normal.    Lab Results  Component Value Date   WBC 7.7 10/06/2012   HGB 10.3* 10/06/2012   HCT 33.0* 10/06/2012   PLT 251 10/06/2012   GLUCOSE 96 09/12/2012   CHOL 221* 09/09/2011   TRIG 286.0* 09/09/2011   HDL 32.10* 09/09/2011   LDLDIRECT 144.6 09/09/2011   LDLCALC  Value: 154        Total Cholesterol/HDL:CHD Risk Coronary Heart Disease Risk Table                     Men   Women  1/2 Average Risk   3.4   3.3  Average Risk       5.0   4.4  2 X Average Risk   9.6   7.1  3 X Average Risk  23.4   11.0        Use the calculated Patient Ratio above and the CHD Risk Table to determine the patient's CHD Risk.  ATP III CLASSIFICATION (LDL):  <100     mg/dL   Optimal  147-829  mg/dL   Near or Above                    Optimal  130-159  mg/dL   Borderline  562-130  mg/dL   High  >865     mg/dL   Very High* 7/84/6962   ALT 12 07/20/2012   AST 16 07/20/2012   NA 137 09/12/2012   K 4.8 09/12/2012   CL 103 09/12/2012   CREATININE 1.7* 09/12/2012   BUN 30* 09/12/2012   CO2 28 09/12/2012   TSH 3.81 07/20/2012   INR 2.80 11/03/2012   HGBA1C 6.1 07/20/2012   MICROALBUR 3.1* 09/27/2006         Assessment & Plan:

## 2012-11-11 NOTE — Patient Instructions (Signed)
Try Almond milk

## 2012-11-14 ENCOUNTER — Ambulatory Visit (INDEPENDENT_AMBULATORY_CARE_PROVIDER_SITE_OTHER): Payer: Medicare Other | Admitting: Internal Medicine

## 2012-11-14 ENCOUNTER — Other Ambulatory Visit (INDEPENDENT_AMBULATORY_CARE_PROVIDER_SITE_OTHER): Payer: Medicare Other

## 2012-11-14 ENCOUNTER — Encounter: Payer: Self-pay | Admitting: Internal Medicine

## 2012-11-14 VITALS — BP 112/66 | HR 54 | Ht 64.0 in | Wt 224.0 lb

## 2012-11-14 DIAGNOSIS — M353 Polymyalgia rheumatica: Secondary | ICD-10-CM

## 2012-11-14 DIAGNOSIS — I6350 Cerebral infarction due to unspecified occlusion or stenosis of unspecified cerebral artery: Secondary | ICD-10-CM

## 2012-11-14 DIAGNOSIS — J449 Chronic obstructive pulmonary disease, unspecified: Secondary | ICD-10-CM

## 2012-11-14 DIAGNOSIS — Z9989 Dependence on other enabling machines and devices: Secondary | ICD-10-CM

## 2012-11-14 DIAGNOSIS — M545 Low back pain, unspecified: Secondary | ICD-10-CM

## 2012-11-14 DIAGNOSIS — G4733 Obstructive sleep apnea (adult) (pediatric): Secondary | ICD-10-CM

## 2012-11-14 DIAGNOSIS — R0609 Other forms of dyspnea: Secondary | ICD-10-CM

## 2012-11-14 DIAGNOSIS — N12 Tubulo-interstitial nephritis, not specified as acute or chronic: Secondary | ICD-10-CM

## 2012-11-14 DIAGNOSIS — R0600 Dyspnea, unspecified: Secondary | ICD-10-CM

## 2012-11-14 DIAGNOSIS — E119 Type 2 diabetes mellitus without complications: Secondary | ICD-10-CM

## 2012-11-14 DIAGNOSIS — I5032 Chronic diastolic (congestive) heart failure: Secondary | ICD-10-CM

## 2012-11-14 DIAGNOSIS — M109 Gout, unspecified: Secondary | ICD-10-CM

## 2012-11-14 DIAGNOSIS — D649 Anemia, unspecified: Secondary | ICD-10-CM

## 2012-11-14 DIAGNOSIS — I509 Heart failure, unspecified: Secondary | ICD-10-CM

## 2012-11-14 DIAGNOSIS — I639 Cerebral infarction, unspecified: Secondary | ICD-10-CM

## 2012-11-14 LAB — BASIC METABOLIC PANEL
BUN: 38 mg/dL — ABNORMAL HIGH (ref 6–23)
Calcium: 9 mg/dL (ref 8.4–10.5)
Creatinine, Ser: 2 mg/dL — ABNORMAL HIGH (ref 0.4–1.2)
GFR: 26.6 mL/min — ABNORMAL LOW (ref >60.00)
Glucose, Bld: 122 mg/dL — ABNORMAL HIGH (ref 70–99)
Potassium: 5.3 mEq/L — ABNORMAL HIGH (ref 3.5–5.1)

## 2012-11-14 LAB — IBC PANEL
Iron: 27 ug/dL — ABNORMAL LOW (ref 42–145)
Transferrin: 368.7 mg/dL — ABNORMAL HIGH (ref 212.0–360.0)

## 2012-11-14 LAB — HEPATIC FUNCTION PANEL
ALT: 14 U/L (ref 0–35)
AST: 16 U/L (ref 0–37)
Albumin: 3.7 g/dL (ref 3.5–5.2)
Alkaline Phosphatase: 63 U/L (ref 39–117)

## 2012-11-14 LAB — CBC WITH DIFFERENTIAL/PLATELET
Basophils Absolute: 0.1 10*3/uL (ref 0.0–0.1)
Eosinophils Relative: 2.2 % (ref 0.0–5.0)
Lymphs Abs: 0.6 10*3/uL — ABNORMAL LOW (ref 0.7–4.0)
Monocytes Absolute: 0.6 10*3/uL (ref 0.1–1.0)
Monocytes Relative: 4.5 % (ref 3.0–12.0)
Neutrophils Relative %: 88.5 % — ABNORMAL HIGH (ref 43.0–77.0)
Platelets: 437 10*3/uL — ABNORMAL HIGH (ref 150.0–400.0)
RDW: 17.8 % — ABNORMAL HIGH (ref 11.5–14.6)
WBC: 13.4 10*3/uL — ABNORMAL HIGH (ref 4.5–10.5)

## 2012-11-14 LAB — URIC ACID: Uric Acid, Serum: 10.3 mg/dL — ABNORMAL HIGH (ref 2.4–7.0)

## 2012-11-14 LAB — PULMONARY FUNCTION TEST

## 2012-11-14 NOTE — Progress Notes (Signed)
PFT done today. Jennifer Castillo, CMA  

## 2012-11-14 NOTE — Progress Notes (Signed)
Subjective:    [name], female    DOB: Apr 22, 1936, 77 y.o.   MRN: [id number]  HPI 10/17/10- 77 yoF former smoker followed for OSA and COPD, complicated by DM, hx breast cancer, hx DVT. Here with husband. Last here Oct 17, 2009- note reviewed.  She denies any significant breathing issues since last here. Continues to use CPAP at 10 all night every night. Never recurrence of DVT left leg.  Noted minor cough when first lying down- they are not concerned.   10/22/11- 77 yoF former smoker followed for OSA and COPD, complicated by DM, hx breast cancer, hx DVT.... Here with husband. Denies any SOB, wheezing, cough, or congestion. Wears CPAP every night for 5-6 hours approx. Stable dyspnea on exertion with hills and stairs. Hospitalized from February 18 through 26 with pulmonary edema, history AFib, chronic Coumadin, history of DVT. Ejection fraction 25-30%.  01/29/12- 77 yoF former smoker followed for OSA and COPD, complicated by DM, hx breast cancer, hx DVT.... Here with husband. ACUTE VISIT: Increased SOB and worse at night; get raspy breathing prior to SOB starting up. This episode made her feel like her stomach was going to pop. Hospital 8/20 through 01/13/2012-right carotid endarterectomy/Dr. Bridgett Larsson. Occasional episodes all summer described as onset of shortness of breath in the evening after dinner when she is reading or watching TV or it she starts clearing her throat and feels bloated in the abdomen which makes her more short of breath. Worse if lying down. Denies fever, purulent discharge, palpitation or chest pain. 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 CPAP all night every night. She has been mis- using Advair, saving it for her episodes of dyspnea, so she uses it once at night with 2 or 3 activations. I reeducated her on use as a maintenance inhaler.  03/15/12- 77 yoF former smoker followed for OSA and  COPD, complicated by DM, hx breast cancer, hx DVT.... Here with husband.  Pt states that SOB has improved since last OV. No complaints She feels well and is walking more. Recognizes fluid retention causes shortness of breath. Her diuretic therapy helps. Pleased with CPAP 10/Advanced used all night every night. CXR 02/02/12-reviewed with her IMPRESSION:  Cardiomegaly. COPD/chronic changes. No active disease.  Original Report Authenticated By: Raelyn Number, M.D.   09/13/12- 77 yoF former smoker followed for OSA and COPD, complicated by DM, hx breast cancer, hx DVT.... Here with husband. FOLLOWS FOR:SOB at times and feelings of not being able to breathe-denies this happens with activity; states she goes to bed reading and about 30 minutes later she cant breathe and feels congestion. CPAP 10/ Advanced Is positional dyspnea happens about one time a month and is relieved by sitting up and waiting for it to go away.intends to start before she puts her CPAP on. She has been using either Dulera or Advair intermittently as rescue inhalers which we discussed. She emphasizes that she gets acutely short of breath and will grab for anything. She wasn't satisfied to discuss the difference between maintenance and rescue inhalers. She is being worked up for anemia. Glaucoma is treated with Timoptic which may cause bronchospasm. History of diastolic heart failure followed by cardiology. CXR 05/03/12 IMPRESSION:  Cardiac enlargement with pulmonary vascular congestion and  interstitial edema similar to previous study.  Original Report Authenticated By: Lucienne Capers, M.D.  11/14/12-  77 yoF former smoker followed for OSA and COPD, complicated by  DM, hx breast cancer, hx DVT, hx anemia.... Here with husband. FOLLWS FOR: review PFT with patient; Occasional SOB at times. Continues CPAP 10/ [employer] Reports little cough. BNP 09/13/12- 458, down from 3006 PFT- 11/14/2012-normal spirometry flows without response to  bronchodilator, normal lung volumes, diffusion moderately reduced/55% predicted CXR 09/13/12 IMPRESSION:  Enlargement of cardiac silhouette with pulmonary vascular  congestion.  Minimal chronic bronchitic changes with left basilar atelectasis.  Original Report Authenticated By: Ulyses Southward, M.D.  ROS-see HPI Constitutional:   No-   weight loss, night sweats, fevers, chills, fatigue, lassitude. HEENT:   No-  headaches, difficulty swallowing, tooth/dental problems, sore throat,       No-  sneezing, itching, ear ache, nasal congestion, post nasal drip,  CV:  No-   chest pain, +orthopnea, +PND, swelling in lower extremities, anasarca, dizziness, palpitations Resp: +shortness of breath with exertion or at rest.              No-   productive cough,  No non-productive cough,  No- coughing up of blood.              No-   change in color of mucus.  No- wheezing.   Skin: No-   rash or lesions. GI:  No-   heartburn, indigestion, abdominal pain, nausea, vomiting,  GU:  MS:  No-   joint pain or swelling.   Neuro-     nothing unusual Psych:  No- change in mood or affect. No depression or anxiety.  No memory loss.  OBJ- Physical Exam General- Alert, Oriented, Affect-appropriate, Distress- none acute, overweight Skin- rash-none, lesions- none, excoriation- none Lymphadenopathy- none Head- atraumatic            Eyes- Gross vision intact, PERRLA, conjunctivae and secretions clear            Ears- Hearing, canals-normal            Nose- Clear, no-Septal dev, mucus, polyps, erosion, perforation             Throat- Mallampati II , mucosa clear , drainage- none, tonsils- atrophic Neck- flexible , trachea midline, no stridor , thyroid nl, + R carotid scar Chest - symmetrical excursion , unlabored           Heart/CV- RRR , +1/6 systolic Aortic murmur , no gallop  , no rub, nl s1 s2                           - JVD- none , edema+trace, stasis changes- none, varices- none           Lung- clear to P&A, wheeze-  none, cough- none , dullness-none, rub- none           Chest wall-  Abd-  Br/ Gen/ Rectal- Not done, not indicated Extrem- cyanosis- none, clubbing, none, atrophy- none, strength- nl Neuro- grossly intact to observation

## 2012-11-14 NOTE — Patient Instructions (Addendum)
Please call as needed 

## 2012-11-17 ENCOUNTER — Other Ambulatory Visit: Payer: Self-pay | Admitting: Internal Medicine

## 2012-11-17 DIAGNOSIS — M109 Gout, unspecified: Secondary | ICD-10-CM

## 2012-11-17 MED ORDER — FEBUXOSTAT 40 MG PO TABS: 40.0000 mg | ORAL_TABLET | Freq: Every day | ORAL | Status: DC

## 2012-11-27 NOTE — Assessment & Plan Note (Signed)
Normal spirometry flows and lung volumes. Reduced diffusion capacity appears related to chronic diastolic heart failure and obesity. There is little COPD.

## 2012-11-27 NOTE — Assessment & Plan Note (Signed)
Okay to continue CPAP 10

## 2012-12-01 ENCOUNTER — Other Ambulatory Visit (HOSPITAL_BASED_OUTPATIENT_CLINIC_OR_DEPARTMENT_OTHER): Payer: Medicare Other | Admitting: Lab

## 2012-12-01 DIAGNOSIS — Z86718 Personal history of other venous thrombosis and embolism: Secondary | ICD-10-CM

## 2012-12-02 ENCOUNTER — Telehealth: Payer: Self-pay | Admitting: *Deleted

## 2012-12-02 DIAGNOSIS — Z86718 Personal history of other venous thrombosis and embolism: Secondary | ICD-10-CM

## 2012-12-02 MED ORDER — WARFARIN SODIUM 3 MG PO TABS: 3.0000 mg | ORAL_TABLET | Freq: Every day | ORAL | Status: DC

## 2012-12-02 NOTE — Telephone Encounter (Signed)
Called pt, she denies any missed doses. Stated her diet has changed, eating more greens to bring her HGB up. Instructed pt to be consistent with her diet. She voiced understanding. PT/INR reviewed by Misty Stanley, order received to increase Coumadin to 3 mg daily. Recheck in one week. She voiced understanding. Pt requested 3 mg Rx to be sent to pharmacy. Currently has 1 mg tablets. Takes 3 mg MWF. 2 mg all other days.

## 2012-12-05 ENCOUNTER — Telehealth: Payer: Self-pay | Admitting: Oncology

## 2012-12-05 NOTE — Telephone Encounter (Signed)
Added lb for 7/18 @ 1:45pm. S/w pt husband - other appts remain the same.

## 2012-12-09 ENCOUNTER — Other Ambulatory Visit (HOSPITAL_BASED_OUTPATIENT_CLINIC_OR_DEPARTMENT_OTHER): Payer: Medicare Other

## 2012-12-09 DIAGNOSIS — Z86718 Personal history of other venous thrombosis and embolism: Secondary | ICD-10-CM

## 2012-12-09 LAB — PROTIME-INR

## 2012-12-09 LAB — PROTHROMBIN TIME
INR: 4.65 — ABNORMAL HIGH (ref <1.50)
Prothrombin Time: 42 seconds — ABNORMAL HIGH (ref 11.6–15.2)

## 2012-12-12 ENCOUNTER — Other Ambulatory Visit: Payer: Self-pay | Admitting: *Deleted

## 2012-12-12 ENCOUNTER — Ambulatory Visit (HOSPITAL_BASED_OUTPATIENT_CLINIC_OR_DEPARTMENT_OTHER): Payer: Medicare Other | Admitting: Lab

## 2012-12-12 DIAGNOSIS — Z86718 Personal history of other venous thrombosis and embolism: Secondary | ICD-10-CM

## 2012-12-12 LAB — PROTIME-INR: INR: 2.2 (ref 2.00–3.50)

## 2012-12-13 ENCOUNTER — Telehealth: Payer: Self-pay | Admitting: *Deleted

## 2012-12-13 ENCOUNTER — Other Ambulatory Visit: Payer: Self-pay | Admitting: Internal Medicine

## 2012-12-13 DIAGNOSIS — Z86718 Personal history of other venous thrombosis and embolism: Secondary | ICD-10-CM

## 2012-12-13 DIAGNOSIS — C801 Malignant (primary) neoplasm, unspecified: Secondary | ICD-10-CM

## 2012-12-13 NOTE — Telephone Encounter (Signed)
Called pt and instructed her to go back to old dose of coumadin per Dr Truett Perna (3mg  MWF, 2mg  all other days) and recheck labs in 2 weeks. Pt verbalized understanding.

## 2012-12-16 ENCOUNTER — Telehealth: Payer: Self-pay | Admitting: Oncology

## 2012-12-16 NOTE — Telephone Encounter (Signed)
s.w. pt and advied on 8.6.14 lab....pt ok and awaer

## 2012-12-22 ENCOUNTER — Other Ambulatory Visit (HOSPITAL_COMMUNITY): Payer: Medicare Other

## 2012-12-22 ENCOUNTER — Ambulatory Visit: Payer: Medicare Other | Admitting: Cardiovascular Disease

## 2012-12-28 ENCOUNTER — Other Ambulatory Visit: Payer: Self-pay

## 2012-12-28 ENCOUNTER — Other Ambulatory Visit (HOSPITAL_BASED_OUTPATIENT_CLINIC_OR_DEPARTMENT_OTHER): Payer: Medicare Other

## 2012-12-28 DIAGNOSIS — Z86718 Personal history of other venous thrombosis and embolism: Secondary | ICD-10-CM

## 2012-12-28 LAB — PROTIME-INR

## 2012-12-29 ENCOUNTER — Telehealth: Payer: Self-pay | Admitting: *Deleted

## 2012-12-29 NOTE — Telephone Encounter (Signed)
Per Dr. Truett Perna : Change coumadin to 2 mg T & Th and 3 mg all other days. Recheck lab 8/14 as scheduled. Called home and left VM to call office for instructions, but to take 2 mg tonight.

## 2012-12-30 NOTE — Telephone Encounter (Signed)
Provided patient with new coumadin directions. She was able to repeat back to nurse correctly. Recheck 8/14

## 2013-01-05 ENCOUNTER — Other Ambulatory Visit (HOSPITAL_BASED_OUTPATIENT_CLINIC_OR_DEPARTMENT_OTHER): Payer: Medicare Other | Admitting: Lab

## 2013-01-05 DIAGNOSIS — D649 Anemia, unspecified: Secondary | ICD-10-CM

## 2013-01-05 DIAGNOSIS — Z86718 Personal history of other venous thrombosis and embolism: Secondary | ICD-10-CM

## 2013-01-05 LAB — PROTIME-INR: Protime: 27.6 Seconds — ABNORMAL HIGH (ref 10.6–13.4)

## 2013-01-06 ENCOUNTER — Telehealth: Payer: Self-pay | Admitting: *Deleted

## 2013-01-06 NOTE — Telephone Encounter (Signed)
Continue same coumadin 3 mg daily/ 2 mg T & Th. Recheck 9/11.

## 2013-01-17 ENCOUNTER — Other Ambulatory Visit: Payer: Self-pay | Admitting: Internal Medicine

## 2013-01-24 ENCOUNTER — Other Ambulatory Visit: Payer: Self-pay | Admitting: Internal Medicine

## 2013-01-30 ENCOUNTER — Encounter: Payer: Self-pay | Admitting: Cardiovascular Disease

## 2013-01-30 ENCOUNTER — Ambulatory Visit (INDEPENDENT_AMBULATORY_CARE_PROVIDER_SITE_OTHER): Payer: Medicare Other | Admitting: Cardiovascular Disease

## 2013-01-30 ENCOUNTER — Other Ambulatory Visit: Payer: Self-pay | Admitting: Internal Medicine

## 2013-01-30 ENCOUNTER — Ambulatory Visit (HOSPITAL_COMMUNITY): Payer: Medicare Other | Attending: Cardiovascular Disease | Admitting: Radiology

## 2013-01-30 VITALS — BP 150/70 | HR 56 | Wt 229.0 lb

## 2013-01-30 DIAGNOSIS — C50919 Malignant neoplasm of unspecified site of unspecified female breast: Secondary | ICD-10-CM | POA: Insufficient documentation

## 2013-01-30 DIAGNOSIS — I679 Cerebrovascular disease, unspecified: Secondary | ICD-10-CM | POA: Insufficient documentation

## 2013-01-30 DIAGNOSIS — I658 Occlusion and stenosis of other precerebral arteries: Secondary | ICD-10-CM

## 2013-01-30 DIAGNOSIS — I509 Heart failure, unspecified: Secondary | ICD-10-CM

## 2013-01-30 DIAGNOSIS — J449 Chronic obstructive pulmonary disease, unspecified: Secondary | ICD-10-CM | POA: Insufficient documentation

## 2013-01-30 DIAGNOSIS — I428 Other cardiomyopathies: Secondary | ICD-10-CM

## 2013-01-30 DIAGNOSIS — J4489 Other specified chronic obstructive pulmonary disease: Secondary | ICD-10-CM | POA: Insufficient documentation

## 2013-01-30 DIAGNOSIS — R0602 Shortness of breath: Secondary | ICD-10-CM

## 2013-01-30 DIAGNOSIS — I379 Nonrheumatic pulmonary valve disorder, unspecified: Secondary | ICD-10-CM | POA: Insufficient documentation

## 2013-01-30 DIAGNOSIS — I5022 Chronic systolic (congestive) heart failure: Secondary | ICD-10-CM

## 2013-01-30 DIAGNOSIS — I10 Essential (primary) hypertension: Secondary | ICD-10-CM | POA: Insufficient documentation

## 2013-01-30 DIAGNOSIS — E119 Type 2 diabetes mellitus without complications: Secondary | ICD-10-CM | POA: Insufficient documentation

## 2013-01-30 DIAGNOSIS — I4891 Unspecified atrial fibrillation: Secondary | ICD-10-CM | POA: Insufficient documentation

## 2013-01-30 DIAGNOSIS — I6529 Occlusion and stenosis of unspecified carotid artery: Secondary | ICD-10-CM

## 2013-01-30 DIAGNOSIS — I480 Paroxysmal atrial fibrillation: Secondary | ICD-10-CM

## 2013-01-30 DIAGNOSIS — I080 Rheumatic disorders of both mitral and aortic valves: Secondary | ICD-10-CM | POA: Insufficient documentation

## 2013-01-30 DIAGNOSIS — E785 Hyperlipidemia, unspecified: Secondary | ICD-10-CM

## 2013-01-30 DIAGNOSIS — Z86718 Personal history of other venous thrombosis and embolism: Secondary | ICD-10-CM | POA: Insufficient documentation

## 2013-01-30 DIAGNOSIS — Z87891 Personal history of nicotine dependence: Secondary | ICD-10-CM | POA: Insufficient documentation

## 2013-01-30 DIAGNOSIS — I079 Rheumatic tricuspid valve disease, unspecified: Secondary | ICD-10-CM | POA: Insufficient documentation

## 2013-01-30 DIAGNOSIS — I6523 Occlusion and stenosis of bilateral carotid arteries: Secondary | ICD-10-CM

## 2013-01-30 NOTE — Assessment & Plan Note (Signed)
Maint NSR with no palpitations  Continue coumadin  INR Rx today

## 2013-01-30 NOTE — Assessment & Plan Note (Signed)
S/P CVA with bilateral CEA  F/U duplex in a year  ASA

## 2013-01-30 NOTE — Assessment & Plan Note (Signed)
Cholesterol is at goal.  Continue current dose of statin and diet Rx.  No myalgias or side effects.  F/U  LFT's in 6 months. Lab Results  Component Value Date   LDLCALC  Value: 154        Total Cholesterol/HDL:CHD Risk Coronary Heart Disease Risk Table                     Men   Women  1/2 Average Risk   3.4   3.3  Average Risk       5.0   4.4  2 X Average Risk   9.6   7.1  3 X Average Risk  23.4   11.0        Use the calculated Patient Ratio above and the CHD Risk Table to determine the patient's CHD Risk.        ATP III CLASSIFICATION (LDL):  <100     mg/dL   Optimal  782-956  mg/dL   Near or Above                    Optimal  130-159  mg/dL   Borderline  213-086  mg/dL   High  >578     mg/dL   Very High* 4/69/6295

## 2013-01-30 NOTE — Assessment & Plan Note (Signed)
Anemia contributes.  EF now normal on Rx  Mild AS  F/U echo in a year.

## 2013-01-30 NOTE — Patient Instructions (Signed)
Your physician wants you to follow-up in:  6 MONTHS WITH DR NISHAN  You will receive a reminder letter in the mail two months in advance. If you don't receive a letter, please call our office to schedule the follow-up appointment. Your physician recommends that you continue on your current medications as directed. Please refer to the Current Medication list given to you today. 

## 2013-01-30 NOTE — Progress Notes (Signed)
Echocardiogram performed.  

## 2013-01-30 NOTE — Progress Notes (Signed)
[name], female   DOB: 1935/05/29, 77 y.o.   MRN: [id number] Wendy Cole is a 77 year old female with a history of heart failure NICM, PAF, OSA, HTN, prior DVT, renal insufficiency . Carotid duplex on 11/10/11 showed bilateral 80-99% ICA stenosis and she is post bilateral CEA;s She is normally seen by Dr [place] and saw Dr Tenny Craw in consultation at the hospital 6/26 for post op hypertension. She had a heart cath 07/13/11  She developed acute shortness of breath and came to the hospital where she was in flash pulmonary edema. She was on them overloaded and admitted for further evaluation and treatment.  She was diuresed with Demadex and lost 6 pounds during her hospital stay. As her volume status improved her shortness of breath improved. She has a history of chronic kidney disease with a creatinine between 1.4 and 2.0. Her BUN and creatinine worsened with diuresis but her labs were reviewed and it was felt her creatinine would need to be in this range for her volume status to be stable. She admitted to dietary indiscretion and noncompliance with medications. She was encouraged to stick to a 2 g sodium, heart healthy diet. She was also encouraged to take her diuretic on a daily basis to weigh herself daily to track her weight.  There was concern at that inadequately treated hypertension was a cause for her flash pulmonary edema. She had hydralazine and nitrates added to her medication regimen and tolerated these medications. Because of her renal insufficiency, she is not on an ACE inhibitor or an ARB. She is on a beta blocker. She has a history of obstructive sleep apnea and CPAP was ordered for her but she refused while in the hospital. Her blood sugars were monitored while she was in the hospital and they were stable. Her INR was also monitored and her Coumadin level is therapeutic.  Echo 2/13 reviewed  Study Conclusions  - Left ventricle: Systolic function was severely reduced. The  estimated ejection fraction was in the range of 25% to 30%. Features are consistent with a pseudonormal left ventricular filling pattern, with concomitant abnormal relaxation and increased filling pressure (grade 2 diastolic dysfunction). Doppler parameters are consistent with elevated mean left atrial filling pressure. - Regional wall motion abnormality: Akinesis of the basal-mid anteroseptal myocardium; hypokinesis of the entire anterior, mid inferolateral, basal-mid anterolateral, and apical lateral myocardium. - Aortic valve: Mild regurgitation. - Mitral valve: Mild regurgitation. - Left atrium: The atrium was moderately dilated. - Right atrium: The atrium was mildly dilated. - Pulmonary arteries: PA peak pressure: 49mm Hg (S). Impressions:  - The right ventricular systolic pressure was increased consistent with mild pulmonary hypertension.  Today's echo looks much better EF 55-60%  But now with some degree of AS  Mean gradient 22 and peak 31 mmHG  ROS: Denies fever, malais, weight loss, blurry vision, decreased visual acuity, cough, sputum, SOB, hemoptysis, pleuritic pain, palpitaitons, heartburn, abdominal pain, melena, lower extremity edema, claudication, or rash.  All other systems reviewed and negative  General: Affect appropriate Chronicallyh ill pale female HEENT: normal Neck supple with no adenopathy JVP normal no bruits no thyromegaly Lungs clear with no wheezing and good diaphragmatic motion Heart:  S1/S2 AS murmur, no rub, gallop or click PMI normal Abdomen: benighn, BS positve, no tenderness, no AAA no bruit.  No HSM or HJR Distal pulses intact with no bruits No edema Neuro non-focal Skin warm and dry No muscular weakness   Current Outpatient Prescriptions  Medication Sig  Dispense Refill  . ADVAIR DISKUS 100-50 MCG/DOSE AEPB INHALE 1 PUFF INTO THE LUNGS EVERY 12 HOURS  60 each  5  . albuterol (PROVENTIL HFA;VENTOLIN HFA) 108 (90 BASE) MCG/ACT inhaler  Inhale 2 puffs into the lungs every 6 (six) hours as needed for wheezing or shortness of breath.  1 Inhaler  6  . allopurinol (ZYLOPRIM) 100 MG tablet TAKE 1 TABLET DAILY  90 tablet  3  . anastrozole (ARIMIDEX) 1 MG tablet TAKE 1 TABLET BY MOUTH DAILY  90 tablet  1  . brimonidine (ALPHAGAN) 0.15 % ophthalmic solution Place 1 drop into both eyes daily.       [name] buPROPion (WELLBUTRIN SR) 150 MG 12 hr tablet TAKE 1 TABLET BY MOUTH EVERY DAY  30 tablet  PRN  . calcium carbonate (CALCIUM 500) 1250 MG tablet Take 1 tablet by mouth daily.      . carvedilol (COREG) 12.5 MG tablet TAKING 2 TABLETS IN THE MORNING AND 1 AT NIGHT  90 tablet  5  . clorazepate (TRANXENE) 7.5 MG tablet Take 1 tablet (7.5 mg total) by mouth 2 (two) times daily as needed for anxiety.  60 tablet  3  . FERREX 150 FORTE 150-25-1 MG-MCG-MG CAPS TAKE ONE CAPSULE BY MOUTH EVERY DAY  30 each  5  . hydrALAZINE (APRESOLINE) 25 MG tablet Take 1 tablet (25 mg total) by mouth 3 (three) times daily.  90 tablet  11  . isosorbide mononitrate (IMDUR) 60 MG 24 hr tablet Take 1 tablet (60 mg total) by mouth daily.  30 tablet  11  . levothyroxine (SYNTHROID, LEVOTHROID) 137 MCG tablet TAKE 1 TABLET EVERY DAY  30 tablet  5  . omeprazole (PRILOSEC) 40 MG capsule Take 1 capsule (40 mg total) by mouth daily.  30 capsule  11  . prednisoLONE acetate (PRED FORTE) 1 % ophthalmic suspension Place 1 drop into both eyes daily as needed. For eye pain      . predniSONE (DELTASONE) 5 MG tablet       . timolol (TIMOPTIC) 0.5 % ophthalmic solution Place 1 drop into both eyes 2 (two) times daily. Twice daily      . torsemide (DEMADEX) 10 MG tablet Take 10 mg by mouth daily.      . traMADol (ULTRAM) 50 MG tablet Take 1 tablet (50 mg total) by mouth at bedtime as needed. For sleep and pain  100 tablet  1  . vitamin D, CHOLECALCIFEROL, 400 UNITS tablet Take 400 Units by mouth daily.       [name] warfarin (COUMADIN) 3 MG tablet Take 1 tablet (3 mg total) by mouth daily. Or as  directed by physician.  30 tablet  1   No current facility-administered medications for this visit.    Allergies  Amlodipine; Atorvastatin; Colesevelam; Lasix; Statins; and Tape  Electrocardiogram:  SR rate 60 possible old IMI  LVH    Assessment and Plan

## 2013-02-02 ENCOUNTER — Telehealth: Payer: Self-pay | Admitting: *Deleted

## 2013-02-02 ENCOUNTER — Other Ambulatory Visit: Payer: Self-pay | Admitting: *Deleted

## 2013-02-02 ENCOUNTER — Other Ambulatory Visit: Payer: Self-pay | Admitting: Pharmacist

## 2013-02-02 ENCOUNTER — Other Ambulatory Visit (HOSPITAL_BASED_OUTPATIENT_CLINIC_OR_DEPARTMENT_OTHER): Payer: Medicare Other | Admitting: Lab

## 2013-02-02 DIAGNOSIS — Z7901 Long term (current) use of anticoagulants: Secondary | ICD-10-CM

## 2013-02-02 DIAGNOSIS — I82402 Acute embolism and thrombosis of unspecified deep veins of left lower extremity: Secondary | ICD-10-CM | POA: Insufficient documentation

## 2013-02-02 LAB — PROTIME-INR: INR: 3.8 — ABNORMAL HIGH (ref 2.00–3.50)

## 2013-02-02 NOTE — Telephone Encounter (Addendum)
Per Dr. Truett Perna; faxed instruction to hold coumadin 5 days prior to Colonoscopy (scheduled for 02/24/13) then start back day of. [facility] fax # [phone])

## 2013-02-02 NOTE — Telephone Encounter (Signed)
Notified pt of PT/INR results.  Per Dr. Truett Perna instructed pt to hold coumadin today, then take 3 mg MWF, and 2 mg all other days.  Pt voiced back correct instructions.  Also explained to pt that the Coumadin Clinic will be monitoring her PT/INR in the future and appt set up for 02/16/13 11am for lab then 11:15 for coumadin clinic.  Pt verbalized understanding.

## 2013-02-05 ENCOUNTER — Other Ambulatory Visit: Payer: Self-pay | Admitting: Internal Medicine

## 2013-02-13 ENCOUNTER — Ambulatory Visit (INDEPENDENT_AMBULATORY_CARE_PROVIDER_SITE_OTHER): Payer: Medicare Other | Admitting: Internal Medicine

## 2013-02-13 ENCOUNTER — Encounter: Payer: Self-pay | Admitting: Internal Medicine

## 2013-02-13 ENCOUNTER — Other Ambulatory Visit (INDEPENDENT_AMBULATORY_CARE_PROVIDER_SITE_OTHER): Payer: Medicare Other

## 2013-02-13 VITALS — BP 152/78 | HR 60 | Temp 97.3°F | Resp 12 | Wt 234.0 lb

## 2013-02-13 DIAGNOSIS — I82402 Acute embolism and thrombosis of unspecified deep veins of left lower extremity: Secondary | ICD-10-CM

## 2013-02-13 DIAGNOSIS — I480 Paroxysmal atrial fibrillation: Secondary | ICD-10-CM

## 2013-02-13 DIAGNOSIS — R609 Edema, unspecified: Secondary | ICD-10-CM

## 2013-02-13 DIAGNOSIS — E119 Type 2 diabetes mellitus without complications: Secondary | ICD-10-CM

## 2013-02-13 DIAGNOSIS — Z23 Encounter for immunization: Secondary | ICD-10-CM

## 2013-02-13 DIAGNOSIS — E785 Hyperlipidemia, unspecified: Secondary | ICD-10-CM

## 2013-02-13 DIAGNOSIS — Z86718 Personal history of other venous thrombosis and embolism: Secondary | ICD-10-CM

## 2013-02-13 DIAGNOSIS — I82409 Acute embolism and thrombosis of unspecified deep veins of unspecified lower extremity: Secondary | ICD-10-CM

## 2013-02-13 DIAGNOSIS — M109 Gout, unspecified: Secondary | ICD-10-CM

## 2013-02-13 DIAGNOSIS — R600 Localized edema: Secondary | ICD-10-CM

## 2013-02-13 DIAGNOSIS — I4891 Unspecified atrial fibrillation: Secondary | ICD-10-CM

## 2013-02-13 LAB — CBC WITH DIFFERENTIAL/PLATELET
Basophils Absolute: 0 10*3/uL (ref 0.0–0.1)
Eosinophils Absolute: 0.1 10*3/uL (ref 0.0–0.7)
Hemoglobin: 9.4 g/dL — ABNORMAL LOW (ref 12.0–15.0)
Lymphocytes Relative: 4.2 % — ABNORMAL LOW (ref 12.0–46.0)
MCHC: 32.4 g/dL (ref 30.0–36.0)
MCV: 82.2 fl (ref 78.0–100.0)
Monocytes Absolute: 0.4 10*3/uL (ref 0.1–1.0)
Neutrophils Relative %: 92.4 % — ABNORMAL HIGH (ref 43.0–77.0)
Platelets: 413 10*3/uL — ABNORMAL HIGH (ref 150.0–400.0)
RDW: 15.7 % — ABNORMAL HIGH (ref 11.5–14.6)

## 2013-02-13 LAB — BASIC METABOLIC PANEL
BUN: 28 mg/dL — ABNORMAL HIGH (ref 6–23)
CO2: 26 mEq/L (ref 19–32)
Calcium: 8.9 mg/dL (ref 8.4–10.5)
Chloride: 98 mEq/L (ref 96–112)
Creatinine, Ser: 1.7 mg/dL — ABNORMAL HIGH (ref 0.4–1.2)
GFR: 32.01 mL/min — ABNORMAL LOW (ref >60.00)
Potassium: 4.9 mEq/L (ref 3.5–5.1)

## 2013-02-13 NOTE — Assessment & Plan Note (Signed)
  On diet  

## 2013-02-13 NOTE — Progress Notes (Signed)
Subjective:    HPI   F/u LBP - not better The patient presents for a follow-up of  chronic hypertension, chronic dyslipidemia, type 2 diabetes, h/o CHF controlled with medicines. C/o severe pain in thor back and B neck/head pain L>R; stiff - taking Aleve and Tramadol C/o occ R temporal-occip HA   Wt Readings from Last 3 Encounters:  02/13/13 234 lb (106.142 kg)  01/30/13 229 lb (103.874 kg)  11/14/12 224 lb (101.606 kg)   BP Readings from Last 3 Encounters:  02/13/13 152/78  01/30/13 150/70  11/14/12 112/66    Review of Systems  Constitutional: Positive for fatigue. Negative for chills, activity change, appetite change and unexpected weight change.  HENT: Negative for congestion, mouth sores and sinus pressure.   Eyes: Negative for visual disturbance.  Respiratory: Positive for shortness of breath. Negative for cough and chest tightness.   Cardiovascular: Negative for chest pain.  Gastrointestinal: Negative for nausea, vomiting, abdominal pain, blood in stool and abdominal distention.  Genitourinary: Negative for frequency, difficulty urinating and vaginal pain.  Musculoskeletal: Positive for myalgias, back pain, arthralgias and gait problem. Negative for joint swelling.  Skin: Negative for pallor and rash.  Neurological: Negative for dizziness, tremors, weakness, numbness and headaches.  Psychiatric/Behavioral: Negative for suicidal ideas, confusion and sleep disturbance.        Objective:   Physical Exam  Constitutional: She appears well-developed. No distress.  Obese A little pale  HENT:  Head: Normocephalic.  Right Ear: External ear normal.  Left Ear: External ear normal.  Nose: Nose normal.  Mouth/Throat: Oropharynx is clear and moist.  Eyes: Conjunctivae are normal. Pupils are equal, round, and reactive to light. Right eye exhibits no discharge. Left eye exhibits no discharge.  Neck: Normal range of motion. Neck supple. No JVD present. No tracheal deviation  present. No thyromegaly present.  Cardiovascular: Normal rate, regular rhythm and normal heart sounds.   Pulmonary/Chest: No stridor. No respiratory distress. She has no wheezes.  Abdominal: Soft. Bowel sounds are normal. She exhibits no distension and no mass. There is no tenderness. There is no rebound and no guarding.  Musculoskeletal: She exhibits no edema and no tenderness.  Lymphadenopathy:    She has no cervical adenopathy.  Neurological: She displays normal reflexes. No cranial nerve deficit. She exhibits normal muscle tone. Coordination normal.  cane  Skin: No rash noted. No erythema.  Psychiatric: She has a normal mood and affect. Her behavior is normal. Judgment and thought content normal.    Lab Results  Component Value Date   WBC 13.4* 11/14/2012   HGB 10.2* 11/14/2012   HCT 31.5* 11/14/2012   PLT 437.0* 11/14/2012   GLUCOSE 122* 11/14/2012   CHOL 221* 09/09/2011   TRIG 286.0* 09/09/2011   HDL 32.10* 09/09/2011   LDLDIRECT 144.6 09/09/2011   LDLCALC  Value: 154        Total Cholesterol/HDL:CHD Risk Coronary Heart Disease Risk Table                     Men   Women  1/2 Average Risk   3.4   3.3  Average Risk       5.0   4.4  2 X Average Risk   9.6   7.1  3 X Average Risk  23.4   11.0        Use the calculated Patient Ratio above and the CHD Risk Table to determine the patient's CHD Risk.  ATP III CLASSIFICATION (LDL):  <100     mg/dL   Optimal  696-295  mg/dL   Near or Above                    Optimal  130-159  mg/dL   Borderline  284-132  mg/dL   High  >440     mg/dL   Very High* 05/27/7251   ALT 14 11/14/2012   AST 16 11/14/2012   NA 134* 11/14/2012   K 5.3* 11/14/2012   CL 101 11/14/2012   CREATININE 2.0* 11/14/2012   BUN 38* 11/14/2012   CO2 26 11/14/2012   TSH 3.81 07/20/2012   INR 3.80* 02/02/2013   HGBA1C 6.2 11/14/2012   MICROALBUR 3.1* 09/27/2006         Assessment & Plan:

## 2013-02-13 NOTE — Assessment & Plan Note (Signed)
Better  

## 2013-02-13 NOTE — Addendum Note (Signed)
Addended by: Tresa Garter on: 02/13/2013 02:58 PM   Modules accepted: Orders

## 2013-02-13 NOTE — Assessment & Plan Note (Signed)
[facility] Dr Truett Perna Colonoscopy due soon - anticoag will be managed through Dr Truett Perna

## 2013-02-13 NOTE — Assessment & Plan Note (Signed)
Coumadin clinic  

## 2013-02-13 NOTE — Assessment & Plan Note (Signed)
Continue with current prescription therapy as reflected on the Med list.  

## 2013-02-13 NOTE — Assessment & Plan Note (Signed)
Rate controlled Per Coumadin Clinic

## 2013-02-16 ENCOUNTER — Ambulatory Visit (HOSPITAL_BASED_OUTPATIENT_CLINIC_OR_DEPARTMENT_OTHER): Payer: Medicare Other | Admitting: Pharmacist

## 2013-02-16 ENCOUNTER — Other Ambulatory Visit (HOSPITAL_BASED_OUTPATIENT_CLINIC_OR_DEPARTMENT_OTHER): Payer: Medicare Other | Admitting: Lab

## 2013-02-16 DIAGNOSIS — I82409 Acute embolism and thrombosis of unspecified deep veins of unspecified lower extremity: Secondary | ICD-10-CM

## 2013-02-16 DIAGNOSIS — I82402 Acute embolism and thrombosis of unspecified deep veins of left lower extremity: Secondary | ICD-10-CM

## 2013-02-16 LAB — PROTIME-INR
INR: 2.6 (ref 2.00–3.50)
Protime: 31.2 Seconds — ABNORMAL HIGH (ref 10.6–13.4)

## 2013-02-16 NOTE — Progress Notes (Signed)
Pt new to clinic and very confused about procedure.  Was waiting outside clinic door without having been to lab Apoligized for the confusion and delay.  Walked her down to lab and then saw in clinic. She has been on coumadin since 2009 for a LEDVT.  INR today is 2.6 Pt takes 3mg  MWF and 2mg  other days.   She will stop her coumadin on Sun (5 days prior to colonoscopy per Dr. Truett Perna) and will resume at normal dose on Fri evening, Oct 3.   We will see her in clinic on Oct 7, the same day she has an appmt with MD.

## 2013-02-16 NOTE — Patient Instructions (Signed)
Pt takes 3mg  MWF and 2mg  other days.  She will stop her coumadin on Sun (5 days prior to colonoscopy per Dr. Truett Perna) and will resume at normal dose on Fri evening, Oct 3.  We will see her in clinic on Oct 7, the same day she has an appmt with MD.

## 2013-02-19 ENCOUNTER — Other Ambulatory Visit: Payer: Self-pay | Admitting: Internal Medicine

## 2013-02-24 NOTE — Telephone Encounter (Signed)
Called [facility] spoke with Lelon Mast gave md approval.../lmb

## 2013-02-28 ENCOUNTER — Ambulatory Visit (HOSPITAL_BASED_OUTPATIENT_CLINIC_OR_DEPARTMENT_OTHER): Payer: Self-pay | Admitting: Pharmacist

## 2013-02-28 ENCOUNTER — Ambulatory Visit (HOSPITAL_BASED_OUTPATIENT_CLINIC_OR_DEPARTMENT_OTHER): Payer: Medicare Other | Admitting: Oncology

## 2013-02-28 ENCOUNTER — Other Ambulatory Visit (HOSPITAL_BASED_OUTPATIENT_CLINIC_OR_DEPARTMENT_OTHER): Payer: Medicare Other | Admitting: Lab

## 2013-02-28 ENCOUNTER — Telehealth: Payer: Self-pay | Admitting: Oncology

## 2013-02-28 VITALS — BP 160/56 | HR 63 | Temp 98.2°F | Resp 20 | Ht 64.0 in | Wt 232.2 lb

## 2013-02-28 DIAGNOSIS — J449 Chronic obstructive pulmonary disease, unspecified: Secondary | ICD-10-CM

## 2013-02-28 DIAGNOSIS — I82409 Acute embolism and thrombosis of unspecified deep veins of unspecified lower extremity: Secondary | ICD-10-CM

## 2013-02-28 DIAGNOSIS — Z7901 Long term (current) use of anticoagulants: Secondary | ICD-10-CM

## 2013-02-28 DIAGNOSIS — I82402 Acute embolism and thrombosis of unspecified deep veins of left lower extremity: Secondary | ICD-10-CM

## 2013-02-28 DIAGNOSIS — C50319 Malignant neoplasm of lower-inner quadrant of unspecified female breast: Secondary | ICD-10-CM

## 2013-02-28 DIAGNOSIS — C50519 Malignant neoplasm of lower-outer quadrant of unspecified female breast: Secondary | ICD-10-CM

## 2013-02-28 DIAGNOSIS — Z170 Estrogen receptor positive status [ER+]: Secondary | ICD-10-CM

## 2013-02-28 DIAGNOSIS — D649 Anemia, unspecified: Secondary | ICD-10-CM

## 2013-02-28 DIAGNOSIS — Z86718 Personal history of other venous thrombosis and embolism: Secondary | ICD-10-CM

## 2013-02-28 LAB — PROTIME-INR: INR: 1.5 — ABNORMAL LOW (ref 2.00–3.50)

## 2013-02-28 LAB — POCT INR: INR: 1.5

## 2013-02-28 NOTE — Telephone Encounter (Signed)
Gave pt appt for Md appt on April and pt was dissatisfied due to the PT/INR appt explained to them if pharmacist gave them time they should show up at that time

## 2013-02-28 NOTE — Progress Notes (Signed)
INR = 1.5 on Coumadin 2 mg daily except 3 mg on MWF Pt was off Coumadin for 5 days for colonoscopy last week.  She restarted her Coumadin on Fri 02/24/13 at her usual dose. Pt has pain in her legs chronically; no new pain/swelling. INR low but pt held Coumadin for procedure.  Pt states her current dose of Coumadin is the best dose for her & when changes are made, things get "messed up."  Therefore, I will not adjust her dose. Return next week for protime.  I think she just needs a little more time to get back on her stable dose. Ebony Hail, Pharm.D., CPP 02/28/2013@1 :00 PM

## 2013-02-28 NOTE — Progress Notes (Signed)
   [facility]    OFFICE PROGRESS NOTE   INTERVAL HISTORY:   She returns for a scheduled followup visit. She reports undergoing a colonoscopy last week that revealed only diverticulosis. She was taken off Coumadin for the colonoscopy procedure.  She continues to have mild swelling and discomfort at the left lower leg. She is taking Arimidex. Good appetite.  Objective:  Vital signs in last 24 hours:  Blood pressure 160/56, pulse 63, temperature 98.2 F (36.8 C), temperature source Oral, resp. rate 20, height 5\' 4"  (1.626 m), weight 232 lb 3.2 oz (105.325 kg).    HEENT: Neck without mass Lymphatics: No cervical, supra-clavicular, or axillary nodes Resp: Lungs clear bilaterally Cardio: Regular rate and rhythm GI: No hepatomegaly Vascular: Trace edema at the left lower leg without erythema Breast: Status post right mastectomy. No evidence for chest wall tumor recurrence. Left breast without mass    Lab Results:  Lab Results  Component Value Date   WBC 14.0* 02/13/2013   HGB 9.4* 02/13/2013   HCT 29.0* 02/13/2013   MCV 82.2 02/13/2013   PLT 413.0* 02/13/2013   ANC 12.9  Creatinine 1.7  PT/INR 1.5  Medications: I have reviewed the patient's current medications.  Assessment/Plan: 1. Extensive left lower extremity deep vein thrombosis. Maintained on anticoagulation therapy since she was diagnosed with a deep venous thrombosis, 01/31/2009.  a. She was treated with Lovenox followed by heparin anticoagulation while hospitalized, and she was then maintained on Lovenox prior to surgery on 03/02/2009.  b. Now maintained on therapeutic Coumadin anticoagulation.  2. Diagnosed with synchronous T1, ER positive, PR positive, and HER-2 negative right-sided breast cancer on 01/09/2009. a. Status post a right mastectomy and sentinel lymph node biopsy on 03/22/2009 with the pathology confirming two foci of invasive carcinoma measuring 0.6 and 0.4 cm. The invasive tumor was  grade 1 with associated low-grade ductal carcinoma in situ. No lymphovascular invasion was identified, and three right-sided sentinel lymph nodes were negative for metastatic carcinoma.  b. Initiation of adjuvant Arimidex on 04/05/2009. 3. Chronic obstructive pulmonary disease. 4. History of a cerebrovascular accident. 5. Gout. 6. Obstructive sleep apnea. 7. History of renal insufficiency. 8. Anemia-severe microcytic anemia noted on 07/20/2012-she appears to have iron deficiency anemia, we will recommend she increase the iron supplement 9. Admission with "flash "pulmonary edema in February of 2013. 10. Left carotid endarterectomy June 2013, right carotid endarterectomy August 2013   Disposition:  She remains in clinical remission from breast cancer. She will schedule a mammogram for this month. She will continue Arimidex.  Ms. Saling appears to have iron deficiency anemia. We will recommend she increase the iron replacement and schedule a repeat CBC for within the next one to 2 months. She had a recent colonoscopy.  She will return for an office visit in 6 months.   Thornton Papas, MD  02/28/2013  2:01 PM

## 2013-03-01 ENCOUNTER — Telehealth: Payer: Self-pay | Admitting: *Deleted

## 2013-03-01 DIAGNOSIS — D649 Anemia, unspecified: Secondary | ICD-10-CM

## 2013-03-01 MED ORDER — FERROUS SULFATE 325 (65 FE) MG PO TBEC: 325.0000 mg | DELAYED_RELEASE_TABLET | Freq: Three times a day (TID) | ORAL | Status: DC

## 2013-03-01 NOTE — Telephone Encounter (Signed)
Called pt, Dr. Truett Perna recommends to discontinue Ferrex and begin OTC Ferrous Sulfate 325 mg TID. Pt and husband asked several questions, all answered. Both voiced understanding. They understand to expect call from schedulers for 1 mo and 3 mo appts.

## 2013-03-02 ENCOUNTER — Telehealth: Payer: Self-pay | Admitting: Oncology

## 2013-03-02 NOTE — Telephone Encounter (Signed)
Talked to pt and gave her appt for November labs and MD,labs on January 2015

## 2013-03-07 ENCOUNTER — Other Ambulatory Visit (HOSPITAL_BASED_OUTPATIENT_CLINIC_OR_DEPARTMENT_OTHER): Payer: Medicare Other | Admitting: Lab

## 2013-03-07 ENCOUNTER — Ambulatory Visit (HOSPITAL_BASED_OUTPATIENT_CLINIC_OR_DEPARTMENT_OTHER): Payer: Medicare Other | Admitting: Pharmacist

## 2013-03-07 DIAGNOSIS — I82402 Acute embolism and thrombosis of unspecified deep veins of left lower extremity: Secondary | ICD-10-CM

## 2013-03-07 DIAGNOSIS — I82409 Acute embolism and thrombosis of unspecified deep veins of unspecified lower extremity: Secondary | ICD-10-CM

## 2013-03-07 LAB — PROTIME-INR
INR: 2.2 (ref 2.00–3.50)
Protime: 26.4 Seconds — ABNORMAL HIGH (ref 10.6–13.4)

## 2013-03-07 LAB — POCT INR: INR: 2.2

## 2013-03-07 NOTE — Progress Notes (Signed)
Pt seen in clinic today with husband INR=2.2 on 3mg  MWF and 2mg  other days No changes to report.  She had her colonoscopy done early this month.  Continue 3mg  MWF and 2mg  other days.  Return 04/03/13 at 10:30 am for lab; 10:45 pm for Coumadin clinic

## 2013-03-07 NOTE — Patient Instructions (Signed)
Continue 3mg  MWF and 2mg  other days.  Return 04/03/13 at 10:30 am for lab; 10:45 pm for Coumadin clinic

## 2013-03-09 ENCOUNTER — Ambulatory Visit: Payer: Medicare Other | Admitting: Oncology

## 2013-03-13 ENCOUNTER — Ambulatory Visit: Payer: Medicare Other | Admitting: Oncology

## 2013-03-13 ENCOUNTER — Other Ambulatory Visit: Payer: Medicare Other | Admitting: Lab

## 2013-03-22 ENCOUNTER — Encounter: Payer: Self-pay | Admitting: Internal Medicine

## 2013-03-22 ENCOUNTER — Ambulatory Visit (INDEPENDENT_AMBULATORY_CARE_PROVIDER_SITE_OTHER): Payer: Medicare Other | Admitting: Internal Medicine

## 2013-03-22 ENCOUNTER — Other Ambulatory Visit (INDEPENDENT_AMBULATORY_CARE_PROVIDER_SITE_OTHER): Payer: Medicare Other

## 2013-03-22 VITALS — BP 130/80 | HR 64 | Temp 97.5°F | Ht 64.0 in | Wt 238.2 lb

## 2013-03-22 DIAGNOSIS — R609 Edema, unspecified: Secondary | ICD-10-CM

## 2013-03-22 DIAGNOSIS — R600 Localized edema: Secondary | ICD-10-CM

## 2013-03-22 DIAGNOSIS — D649 Anemia, unspecified: Secondary | ICD-10-CM

## 2013-03-22 LAB — CBC WITH DIFFERENTIAL/PLATELET
Basophils Absolute: 0 10*3/uL (ref 0.0–0.1)
Eosinophils Absolute: 0.5 10*3/uL (ref 0.0–0.7)
HCT: 30.6 % — ABNORMAL LOW (ref 36.0–46.0)
Hemoglobin: 9.9 g/dL — ABNORMAL LOW (ref 12.0–15.0)
Lymphs Abs: 0.9 10*3/uL (ref 0.7–4.0)
MCHC: 32.4 g/dL (ref 30.0–36.0)
MCV: 83.1 fl (ref 78.0–100.0)
Monocytes Absolute: 0.9 10*3/uL (ref 0.1–1.0)
Neutro Abs: 6.5 10*3/uL (ref 1.4–7.7)
Platelets: 405 10*3/uL — ABNORMAL HIGH (ref 150.0–400.0)
RDW: 18.5 % — ABNORMAL HIGH (ref 11.5–14.6)
WBC: 9 10*3/uL (ref 4.5–10.5)

## 2013-03-22 NOTE — Progress Notes (Signed)
Subjective:    HPI  C/o L foot and shin swelling/pain x 2 wks  F/u LBP - not better The patient presents for a follow-up of  chronic hypertension, chronic dyslipidemia, type 2 diabetes, h/o CHF controlled with medicines. C/o severe pain in thor back and B neck/head pain L>R; stiff - taking Aleve and Tramadol C/o occ R temporal-occip HA   Wt Readings from Last 3 Encounters:  03/22/13 238 lb 4 oz (108.069 kg)  02/28/13 232 lb 3.2 oz (105.325 kg)  02/13/13 234 lb (106.142 kg)   BP Readings from Last 3 Encounters:  03/22/13 130/80  02/28/13 160/56  02/13/13 152/78    Review of Systems  Constitutional: Positive for fatigue. Negative for chills, activity change, appetite change and unexpected weight change.  HENT: Negative for congestion, mouth sores and sinus pressure.   Eyes: Negative for visual disturbance.  Respiratory: Positive for shortness of breath. Negative for cough and chest tightness.   Cardiovascular: Negative for chest pain.  Gastrointestinal: Negative for nausea, vomiting, abdominal pain, blood in stool and abdominal distention.  Genitourinary: Negative for frequency, difficulty urinating and vaginal pain.  Musculoskeletal: Positive for arthralgias, back pain, gait problem and myalgias. Negative for joint swelling.  Skin: Negative for pallor and rash.  Neurological: Negative for dizziness, tremors, weakness, numbness and headaches.  Psychiatric/Behavioral: Negative for suicidal ideas, confusion and sleep disturbance.        Objective:   Physical Exam  Constitutional: She appears well-developed. No distress.  Obese A little pale  HENT:  Head: Normocephalic.  Right Ear: External ear normal.  Left Ear: External ear normal.  Nose: Nose normal.  Mouth/Throat: Oropharynx is clear and moist.  Eyes: Conjunctivae are normal. Pupils are equal, round, and reactive to light. Right eye exhibits no discharge. Left eye exhibits no discharge.  Neck: Normal range of  motion. Neck supple. No JVD present. No tracheal deviation present. No thyromegaly present.  Cardiovascular: Normal rate, regular rhythm and normal heart sounds.   Pulmonary/Chest: No stridor. No respiratory distress. She has no wheezes.  Abdominal: Soft. Bowel sounds are normal. She exhibits no distension and no mass. There is no tenderness. There is no rebound and no guarding.  Musculoskeletal: She exhibits no edema and no tenderness.  Lymphadenopathy:    She has no cervical adenopathy.  Neurological: She displays normal reflexes. No cranial nerve deficit. She exhibits normal muscle tone. Coordination normal.  cane  Skin: No rash noted. No erythema.  Psychiatric: She has a normal mood and affect. Her behavior is normal. Judgment and thought content normal.  L foot and L calf is swollen, tender  Lab Results  Component Value Date   WBC 14.0* 02/13/2013   HGB 9.4* 02/13/2013   HCT 29.0* 02/13/2013   PLT 413.0* 02/13/2013   GLUCOSE 117* 02/13/2013   CHOL 221* 09/09/2011   TRIG 286.0* 09/09/2011   HDL 32.10* 09/09/2011   LDLDIRECT 144.6 09/09/2011   LDLCALC  Value: 154        Total Cholesterol/HDL:CHD Risk Coronary Heart Disease Risk Table                     Men   Women  1/2 Average Risk   3.4   3.3  Average Risk       5.0   4.4  2 X Average Risk   9.6   7.1  3 X Average Risk  23.4   11.0        Use the calculated  Patient Ratio above and the CHD Risk Table to determine the patient's CHD Risk.        ATP III CLASSIFICATION (LDL):  <100     mg/dL   Optimal  109-323  mg/dL   Near or Above                    Optimal  130-159  mg/dL   Borderline  557-322  mg/dL   High  >025     mg/dL   Very High* 09/18/621   ALT 14 11/14/2012   AST 16 11/14/2012   NA 131* 02/13/2013   K 4.9 02/13/2013   CL 98 02/13/2013   CREATININE 1.7* 02/13/2013   BUN 28* 02/13/2013   CO2 26 02/13/2013   TSH 3.81 07/20/2012   INR 2.2 03/07/2013   HGBA1C 6.2 11/14/2012   MICROALBUR 3.1* 09/27/2006         Assessment & Plan:

## 2013-03-22 NOTE — Assessment & Plan Note (Signed)
Chronic 10/14 LLE x 2 wks  On coumadin; last INR 2.2 Doppler US Elevate leg

## 2013-03-22 NOTE — Assessment & Plan Note (Signed)
CBC

## 2013-03-23 ENCOUNTER — Encounter: Payer: Self-pay | Admitting: Internal Medicine

## 2013-03-23 ENCOUNTER — Ambulatory Visit (HOSPITAL_COMMUNITY): Payer: Medicare Other | Attending: Internal Medicine

## 2013-03-23 ENCOUNTER — Telehealth: Payer: Self-pay | Admitting: *Deleted

## 2013-03-23 DIAGNOSIS — R600 Localized edema: Secondary | ICD-10-CM

## 2013-03-23 DIAGNOSIS — E119 Type 2 diabetes mellitus without complications: Secondary | ICD-10-CM | POA: Insufficient documentation

## 2013-03-23 DIAGNOSIS — M79609 Pain in unspecified limb: Secondary | ICD-10-CM

## 2013-03-23 DIAGNOSIS — Z8673 Personal history of transient ischemic attack (TIA), and cerebral infarction without residual deficits: Secondary | ICD-10-CM | POA: Insufficient documentation

## 2013-03-23 DIAGNOSIS — E785 Hyperlipidemia, unspecified: Secondary | ICD-10-CM | POA: Insufficient documentation

## 2013-03-23 DIAGNOSIS — I739 Peripheral vascular disease, unspecified: Secondary | ICD-10-CM | POA: Insufficient documentation

## 2013-03-23 DIAGNOSIS — M7989 Other specified soft tissue disorders: Secondary | ICD-10-CM

## 2013-03-23 DIAGNOSIS — R0602 Shortness of breath: Secondary | ICD-10-CM | POA: Insufficient documentation

## 2013-03-23 DIAGNOSIS — I10 Essential (primary) hypertension: Secondary | ICD-10-CM | POA: Insufficient documentation

## 2013-03-23 DIAGNOSIS — Z7901 Long term (current) use of anticoagulants: Secondary | ICD-10-CM | POA: Insufficient documentation

## 2013-03-23 DIAGNOSIS — I2510 Atherosclerotic heart disease of native coronary artery without angina pectoris: Secondary | ICD-10-CM | POA: Insufficient documentation

## 2013-03-23 LAB — BASIC METABOLIC PANEL
BUN: 16 mg/dL (ref 6–23)
CO2: 26 mEq/L (ref 19–32)
Chloride: 98 mEq/L (ref 96–112)
Creatinine, Ser: 1.9 mg/dL — ABNORMAL HIGH (ref 0.4–1.2)
Glucose, Bld: 82 mg/dL (ref 70–99)
Potassium: 5.3 mEq/L — ABNORMAL HIGH (ref 3.5–5.1)

## 2013-03-23 LAB — PROTIME-INR: Prothrombin Time: 41.3 s — ABNORMAL HIGH (ref 10.2–12.4)

## 2013-03-23 NOTE — Telephone Encounter (Signed)
Left detailed mess informing pt of below.  

## 2013-03-23 NOTE — Telephone Encounter (Signed)
Pt's husband called stating they seen a commercial on TV about a law suit re: Xarelto. She states she is not going to start taking it. Please advise.

## 2013-03-23 NOTE — Telephone Encounter (Signed)
No need to take it anyway: pls see Mychart Thx

## 2013-03-25 ENCOUNTER — Other Ambulatory Visit: Payer: Self-pay | Admitting: Oncology

## 2013-03-25 DIAGNOSIS — D649 Anemia, unspecified: Secondary | ICD-10-CM

## 2013-04-03 ENCOUNTER — Other Ambulatory Visit: Payer: Self-pay | Admitting: Internal Medicine

## 2013-04-03 ENCOUNTER — Other Ambulatory Visit (HOSPITAL_BASED_OUTPATIENT_CLINIC_OR_DEPARTMENT_OTHER): Payer: Medicare Other

## 2013-04-03 ENCOUNTER — Ambulatory Visit (HOSPITAL_BASED_OUTPATIENT_CLINIC_OR_DEPARTMENT_OTHER): Payer: Medicare Other | Admitting: Pharmacist

## 2013-04-03 DIAGNOSIS — I82402 Acute embolism and thrombosis of unspecified deep veins of left lower extremity: Secondary | ICD-10-CM

## 2013-04-03 DIAGNOSIS — I82409 Acute embolism and thrombosis of unspecified deep veins of unspecified lower extremity: Secondary | ICD-10-CM

## 2013-04-03 LAB — PROTIME-INR: INR: 3.8 — ABNORMAL HIGH (ref 2.00–3.50)

## 2013-04-03 NOTE — Progress Notes (Signed)
INR = 3.8 on WARFARIN 2 mg/day; 3 mg MWF Pt reports no bleeding or bruising. Meds have remained the same. No significant/obvious diet changes. No missed doses of Coumadin. Pt prefers to not make dose changes so I'll have her hold her Coumadin x 1 dose only then go back to usual dosing.  She may eat some greens today if she'd like to. Return in 10 days. Ebony Hail, Pharm.D., CPP 04/03/2013@12 :20 PM

## 2013-04-05 ENCOUNTER — Other Ambulatory Visit: Payer: Self-pay | Admitting: Internal Medicine

## 2013-04-06 ENCOUNTER — Encounter: Payer: Self-pay | Admitting: Internal Medicine

## 2013-04-13 ENCOUNTER — Telehealth: Payer: Self-pay | Admitting: *Deleted

## 2013-04-13 ENCOUNTER — Ambulatory Visit (HOSPITAL_BASED_OUTPATIENT_CLINIC_OR_DEPARTMENT_OTHER): Payer: Medicare Other | Admitting: Pharmacist

## 2013-04-13 ENCOUNTER — Other Ambulatory Visit (HOSPITAL_BASED_OUTPATIENT_CLINIC_OR_DEPARTMENT_OTHER): Payer: Medicare Other | Admitting: Lab

## 2013-04-13 ENCOUNTER — Other Ambulatory Visit: Payer: Self-pay | Admitting: *Deleted

## 2013-04-13 DIAGNOSIS — I82402 Acute embolism and thrombosis of unspecified deep veins of left lower extremity: Secondary | ICD-10-CM

## 2013-04-13 DIAGNOSIS — D649 Anemia, unspecified: Secondary | ICD-10-CM

## 2013-04-13 DIAGNOSIS — I82409 Acute embolism and thrombosis of unspecified deep veins of unspecified lower extremity: Secondary | ICD-10-CM

## 2013-04-13 DIAGNOSIS — I82509 Chronic embolism and thrombosis of unspecified deep veins of unspecified lower extremity: Secondary | ICD-10-CM

## 2013-04-13 LAB — CBC WITH DIFFERENTIAL/PLATELET
BASO%: 1.7 % (ref 0.0–2.0)
Basophils Absolute: 0.1 10*3/uL (ref 0.0–0.1)
EOS%: 7 % (ref 0.0–7.0)
LYMPH%: 10.5 % — ABNORMAL LOW (ref 14.0–49.7)
MCH: 27.3 pg (ref 25.1–34.0)
MCV: 89 fL (ref 79.5–101.0)
MONO%: 7.6 % (ref 0.0–14.0)
RBC: 3.62 10*6/uL — ABNORMAL LOW (ref 3.70–5.45)
RDW: 17.3 % — ABNORMAL HIGH (ref 11.2–14.5)

## 2013-04-13 LAB — PROTIME-INR
INR: 2.7 (ref 2.00–3.50)
Protime: 32.4 Seconds — ABNORMAL HIGH (ref 10.6–13.4)

## 2013-04-13 LAB — POCT INR: INR: 2.7

## 2013-04-13 NOTE — Patient Instructions (Signed)
Continue warfarin 3mg  MWF and 2mg  other days.  Return 05/01/13 at 1 pm for lab; 1:15 pm for Coumadin clinic

## 2013-04-13 NOTE — Telephone Encounter (Signed)
Review of CBC shows Hgb 9.9. Patient is unaware of any bleeding. She is taking her ferrous sulfate twice daily. Instructed her to increase to three times daily per last office note instructions. She agrees to do so. Will recheck CBC and ferritin in 3-4 weeks.

## 2013-04-13 NOTE — Progress Notes (Signed)
Pt seen in clinic today with her husband INR=2.7  No changes to report. INR back in range after one dose. Continue warfarin 3mg  MWF and 2mg  other days.  Return 05/01/13 at 1 pm for lab; 1:15 pm for Coumadin clinic

## 2013-04-14 ENCOUNTER — Other Ambulatory Visit: Payer: Self-pay | Admitting: Oncology

## 2013-04-14 ENCOUNTER — Telehealth: Payer: Self-pay | Admitting: Oncology

## 2013-04-14 NOTE — Telephone Encounter (Signed)
lmonvm advising the pt of her lab appt in dec.

## 2013-04-25 ENCOUNTER — Other Ambulatory Visit (HOSPITAL_COMMUNITY): Payer: Self-pay | Admitting: Physician Assistant

## 2013-05-01 ENCOUNTER — Ambulatory Visit (HOSPITAL_BASED_OUTPATIENT_CLINIC_OR_DEPARTMENT_OTHER): Payer: Medicare Other | Admitting: Pharmacist

## 2013-05-01 ENCOUNTER — Other Ambulatory Visit (HOSPITAL_BASED_OUTPATIENT_CLINIC_OR_DEPARTMENT_OTHER): Payer: Medicare Other | Admitting: Lab

## 2013-05-01 DIAGNOSIS — I82409 Acute embolism and thrombosis of unspecified deep veins of unspecified lower extremity: Secondary | ICD-10-CM

## 2013-05-01 DIAGNOSIS — I82402 Acute embolism and thrombosis of unspecified deep veins of left lower extremity: Secondary | ICD-10-CM

## 2013-05-01 LAB — PROTIME-INR

## 2013-05-01 LAB — POCT INR: INR: 4.1

## 2013-05-01 NOTE — Patient Instructions (Signed)
Hold coumadin today, Monday.  Take 3mg  on Tue and Thur this week.  In Friday start 2mg  daily until we see you in clinic on 05/11/13 at 2:15 for lab and 2:30 in coumadin clinic

## 2013-05-01 NOTE — Progress Notes (Signed)
Pt and her husband seen in clinic today. They discussed concern with the inconvenience of all the stops and what is takes for just one finger stick.   They would like to know if going to [facility] is an option. They stated that Dr. Truett Perna would just call them in the past with instructions based on labwork. The options plan to be discussed at upcoming appointments with her PCP and in January with Dr. Truett Perna.  INR=4.1 today Holding dose today and adjusting to get a 14mg  weekly dose. Instructions given to Hold coumadin today, Monday.   Take 3mg  on Tue and Thur this week.   On Friday start 2mg  daily until we see her in clinic on 05/11/13 at 2:15 for lab and 2:30 in coumadin clinic.  She had no other changes to report Her previously weekly dose was 17mg . She was therapeutic with 14mg  a week in past.

## 2013-05-07 ENCOUNTER — Other Ambulatory Visit: Payer: Self-pay | Admitting: Oncology

## 2013-05-08 ENCOUNTER — Other Ambulatory Visit (INDEPENDENT_AMBULATORY_CARE_PROVIDER_SITE_OTHER): Payer: Medicare Other

## 2013-05-08 DIAGNOSIS — M109 Gout, unspecified: Secondary | ICD-10-CM

## 2013-05-08 DIAGNOSIS — I4891 Unspecified atrial fibrillation: Secondary | ICD-10-CM

## 2013-05-08 DIAGNOSIS — I82402 Acute embolism and thrombosis of unspecified deep veins of left lower extremity: Secondary | ICD-10-CM

## 2013-05-08 DIAGNOSIS — I82409 Acute embolism and thrombosis of unspecified deep veins of unspecified lower extremity: Secondary | ICD-10-CM

## 2013-05-08 DIAGNOSIS — R600 Localized edema: Secondary | ICD-10-CM

## 2013-05-08 DIAGNOSIS — I480 Paroxysmal atrial fibrillation: Secondary | ICD-10-CM

## 2013-05-08 DIAGNOSIS — R609 Edema, unspecified: Secondary | ICD-10-CM

## 2013-05-08 DIAGNOSIS — E119 Type 2 diabetes mellitus without complications: Secondary | ICD-10-CM

## 2013-05-08 DIAGNOSIS — Z86718 Personal history of other venous thrombosis and embolism: Secondary | ICD-10-CM

## 2013-05-08 DIAGNOSIS — E785 Hyperlipidemia, unspecified: Secondary | ICD-10-CM

## 2013-05-08 DIAGNOSIS — Z23 Encounter for immunization: Secondary | ICD-10-CM

## 2013-05-08 LAB — BASIC METABOLIC PANEL
CO2: 24 mEq/L (ref 19–32)
Calcium: 9 mg/dL (ref 8.4–10.5)
Creatinine, Ser: 1.9 mg/dL — ABNORMAL HIGH (ref 0.4–1.2)
Glucose, Bld: 109 mg/dL — ABNORMAL HIGH (ref 70–99)

## 2013-05-08 LAB — LIPID PANEL
HDL: 28.6 mg/dL — ABNORMAL LOW (ref >39.00)
Total CHOL/HDL Ratio: 8
Triglycerides: 354 mg/dL — ABNORMAL HIGH (ref 0.0–149.0)
VLDL: 70.8 mg/dL — ABNORMAL HIGH (ref 0.0–40.0)

## 2013-05-08 LAB — CBC WITH DIFFERENTIAL/PLATELET
Eosinophils Relative: 6.9 % — ABNORMAL HIGH (ref 0.0–5.0)
HCT: 32 % — ABNORMAL LOW (ref 36.0–46.0)
Hemoglobin: 10.5 g/dL — ABNORMAL LOW (ref 12.0–15.0)
Lymphs Abs: 0.6 10*3/uL — ABNORMAL LOW (ref 0.7–4.0)
MCHC: 32.7 g/dL (ref 30.0–36.0)
Monocytes Relative: 7.9 % (ref 3.0–12.0)
Neutro Abs: 5.8 10*3/uL (ref 1.4–7.7)
Neutrophils Relative %: 76.9 % (ref 43.0–77.0)
RBC: 3.69 Mil/uL — ABNORMAL LOW (ref 3.87–5.11)
WBC: 7.5 10*3/uL (ref 4.5–10.5)

## 2013-05-08 LAB — HEPATIC FUNCTION PANEL
ALT: 15 U/L (ref 0–35)
Albumin: 3.8 g/dL (ref 3.5–5.2)
Alkaline Phosphatase: 68 U/L (ref 39–117)
Total Protein: 6.8 g/dL (ref 6.0–8.3)

## 2013-05-08 LAB — TSH: TSH: 3.74 u[IU]/mL (ref 0.35–5.50)

## 2013-05-08 LAB — URINALYSIS, ROUTINE W REFLEX MICROSCOPIC
Hgb urine dipstick: NEGATIVE
Urine Glucose: NEGATIVE
Urobilinogen, UA: 0.2 (ref 0.0–1.0)
pH: 5.5 (ref 5.0–8.0)

## 2013-05-08 LAB — SEDIMENTATION RATE: Sed Rate: 24 mm/hr — ABNORMAL HIGH (ref 0–22)

## 2013-05-08 LAB — HEMOGLOBIN A1C: Hgb A1c MFr Bld: 5.5 % (ref 4.6–6.5)

## 2013-05-11 ENCOUNTER — Other Ambulatory Visit: Payer: Medicare Other

## 2013-05-11 ENCOUNTER — Ambulatory Visit: Payer: Medicare Other

## 2013-05-11 ENCOUNTER — Other Ambulatory Visit (HOSPITAL_BASED_OUTPATIENT_CLINIC_OR_DEPARTMENT_OTHER): Payer: Medicare Other

## 2013-05-11 ENCOUNTER — Ambulatory Visit (HOSPITAL_BASED_OUTPATIENT_CLINIC_OR_DEPARTMENT_OTHER): Payer: Medicare Other | Admitting: Pharmacist

## 2013-05-11 DIAGNOSIS — Z86718 Personal history of other venous thrombosis and embolism: Secondary | ICD-10-CM

## 2013-05-11 DIAGNOSIS — I82402 Acute embolism and thrombosis of unspecified deep veins of left lower extremity: Secondary | ICD-10-CM

## 2013-05-11 DIAGNOSIS — Z7901 Long term (current) use of anticoagulants: Secondary | ICD-10-CM

## 2013-05-11 DIAGNOSIS — I4891 Unspecified atrial fibrillation: Secondary | ICD-10-CM

## 2013-05-11 DIAGNOSIS — I82409 Acute embolism and thrombosis of unspecified deep veins of unspecified lower extremity: Secondary | ICD-10-CM

## 2013-05-11 DIAGNOSIS — Z5181 Encounter for therapeutic drug level monitoring: Secondary | ICD-10-CM

## 2013-05-11 DIAGNOSIS — D649 Anemia, unspecified: Secondary | ICD-10-CM

## 2013-05-11 LAB — FERRITIN CHCC: Ferritin: 50 ng/ml (ref 9–269)

## 2013-05-11 LAB — CBC WITH DIFFERENTIAL/PLATELET
Basophils Absolute: 0.1 10*3/uL (ref 0.0–0.1)
Eosinophils Absolute: 0.4 10*3/uL (ref 0.0–0.5)
HCT: 29.6 % — ABNORMAL LOW (ref 34.8–46.6)
HGB: 9.6 g/dL — ABNORMAL LOW (ref 11.6–15.9)
LYMPH%: 7.2 % — ABNORMAL LOW (ref 14.0–49.7)
MCHC: 32.5 g/dL (ref 31.5–36.0)
MONO#: 0.6 10*3/uL (ref 0.1–0.9)
NEUT#: 6.3 10*3/uL (ref 1.5–6.5)
NEUT%: 78 % — ABNORMAL HIGH (ref 38.4–76.8)
Platelets: 333 10*3/uL (ref 145–400)
WBC: 8 10*3/uL (ref 3.9–10.3)

## 2013-05-11 LAB — POCT INR: INR: 2.6

## 2013-05-11 LAB — PROTIME-INR

## 2013-05-11 NOTE — Progress Notes (Signed)
INR within goal today. No problems to report regarding anticoagulation. No significant changes in diet. No changes in medications. She enjoys collard greens and romaine lettuce. Pt stated that she hasn't eaten many vegetables lately. Will continue Coumadin 2mg  daily.  Recheck INR in 2 weeks on 05/24/13; lab at 1:30pm and CC at 1:45pm.

## 2013-05-11 NOTE — Patient Instructions (Signed)
Continue Coumadin 2mg  daily.  Recheck INR in 2 weeks on 05/24/13; lab at 1:30pm and CC at 1:45pm.

## 2013-05-12 ENCOUNTER — Ambulatory Visit (INDEPENDENT_AMBULATORY_CARE_PROVIDER_SITE_OTHER): Payer: Medicare Other | Admitting: Internal Medicine

## 2013-05-12 ENCOUNTER — Ambulatory Visit (INDEPENDENT_AMBULATORY_CARE_PROVIDER_SITE_OTHER): Payer: Medicare Other | Admitting: General Practice

## 2013-05-12 ENCOUNTER — Telehealth: Payer: Self-pay | Admitting: *Deleted

## 2013-05-12 ENCOUNTER — Telehealth: Payer: Self-pay | Admitting: Oncology

## 2013-05-12 ENCOUNTER — Encounter: Payer: Self-pay | Admitting: Internal Medicine

## 2013-05-12 VITALS — BP 120/68 | HR 72 | Resp 16 | Wt 225.0 lb

## 2013-05-12 DIAGNOSIS — E1169 Type 2 diabetes mellitus with other specified complication: Secondary | ICD-10-CM

## 2013-05-12 DIAGNOSIS — D649 Anemia, unspecified: Secondary | ICD-10-CM

## 2013-05-12 DIAGNOSIS — I10 Essential (primary) hypertension: Secondary | ICD-10-CM

## 2013-05-12 DIAGNOSIS — M353 Polymyalgia rheumatica: Secondary | ICD-10-CM

## 2013-05-12 DIAGNOSIS — I6350 Cerebral infarction due to unspecified occlusion or stenosis of unspecified cerebral artery: Secondary | ICD-10-CM

## 2013-05-12 DIAGNOSIS — E669 Obesity, unspecified: Secondary | ICD-10-CM

## 2013-05-12 DIAGNOSIS — Z7901 Long term (current) use of anticoagulants: Secondary | ICD-10-CM

## 2013-05-12 DIAGNOSIS — Z86718 Personal history of other venous thrombosis and embolism: Secondary | ICD-10-CM

## 2013-05-12 DIAGNOSIS — E1159 Type 2 diabetes mellitus with other circulatory complications: Secondary | ICD-10-CM

## 2013-05-12 DIAGNOSIS — I82409 Acute embolism and thrombosis of unspecified deep veins of unspecified lower extremity: Secondary | ICD-10-CM

## 2013-05-12 DIAGNOSIS — I639 Cerebral infarction, unspecified: Secondary | ICD-10-CM

## 2013-05-12 DIAGNOSIS — I82509 Chronic embolism and thrombosis of unspecified deep veins of unspecified lower extremity: Secondary | ICD-10-CM

## 2013-05-12 NOTE — Telephone Encounter (Signed)
Message copied by Caleb Popp on Fri May 12, 2013  8:54 AM ------      Message from: Thornton Papas B      Created: Thu May 11, 2013  7:56 PM       Please call patient, hb is still low, needs office visit next 1 mo. Sherrill or lisa, repeat cbc/smear/retic/cmet      Anemia may be due to renal insufficiency, may need trial of procrit ------

## 2013-05-12 NOTE — Progress Notes (Signed)
Pre visit review using our clinic review tool, if applicable. No additional management support is needed unless otherwise documented below in the visit note. 

## 2013-05-12 NOTE — Telephone Encounter (Signed)
Called pt informed her of low HGB. She understands to expect call from schedulers with Lab/office visit in one month. Orders entered.

## 2013-05-12 NOTE — Assessment & Plan Note (Signed)
Continue with current prescription therapy as reflected on the Med list.  

## 2013-05-12 NOTE — Telephone Encounter (Signed)
lm for desk nurse re 12/19 pof for mth f/u. no available w/BS so  message to desk to see if 1/9 lb/fu appt can remai as is. pt not yet contacted

## 2013-05-14 NOTE — Assessment & Plan Note (Signed)
On a low dose Prednisone

## 2013-05-14 NOTE — Assessment & Plan Note (Signed)
Continue with current prescription therapy as reflected on the Med list.  

## 2013-05-14 NOTE — Assessment & Plan Note (Signed)
Wt Readings from Last 3 Encounters:  05/12/13 225 lb (102.059 kg)  03/22/13 238 lb 4 oz (108.069 kg)  02/28/13 232 lb 3.2 oz (105.325 kg)  doing better

## 2013-05-15 ENCOUNTER — Encounter: Payer: Self-pay | Admitting: Internal Medicine

## 2013-05-15 ENCOUNTER — Ambulatory Visit (INDEPENDENT_AMBULATORY_CARE_PROVIDER_SITE_OTHER): Payer: Medicare Other | Admitting: Internal Medicine

## 2013-05-15 VITALS — BP 118/72 | HR 57 | Ht 64.0 in | Wt 229.4 lb

## 2013-05-15 DIAGNOSIS — R0602 Shortness of breath: Secondary | ICD-10-CM

## 2013-05-15 DIAGNOSIS — R0609 Other forms of dyspnea: Secondary | ICD-10-CM

## 2013-05-15 DIAGNOSIS — R0989 Other specified symptoms and signs involving the circulatory and respiratory systems: Secondary | ICD-10-CM

## 2013-05-15 DIAGNOSIS — G4733 Obstructive sleep apnea (adult) (pediatric): Secondary | ICD-10-CM

## 2013-05-15 DIAGNOSIS — R0600 Dyspnea, unspecified: Secondary | ICD-10-CM

## 2013-05-15 NOTE — Progress Notes (Signed)
Subjective:    [name], female    DOB: Apr 22, 1936, 77 y.o.   MRN: [id number]  HPI 10/17/10- 70 yoF former smoker followed for OSA and COPD, complicated by DM, hx breast cancer, hx DVT. Here with husband. Last here Oct 17, 2009- note reviewed.  She denies any significant breathing issues since last here. Continues to use CPAP at 10 all night every night. Never recurrence of DVT left leg.  Noted minor cough when first lying down- they are not concerned.   10/22/11- 42 yoF former smoker followed for OSA and COPD, complicated by DM, hx breast cancer, hx DVT.... Here with husband. Denies any SOB, wheezing, cough, or congestion. Wears CPAP every night for 5-6 hours approx. Stable dyspnea on exertion with hills and stairs. Hospitalized from February 18 through 26 with pulmonary edema, history AFib, chronic Coumadin, history of DVT. Ejection fraction 25-30%.  01/29/12- 89 yoF former smoker followed for OSA and COPD, complicated by DM, hx breast cancer, hx DVT.... Here with husband. ACUTE VISIT: Increased SOB and worse at night; get raspy breathing prior to SOB starting up. This episode made her feel like her stomach was going to pop. Hospital 8/20 through 01/13/2012-right carotid endarterectomy/Dr. Bridgett Larsson. Occasional episodes all summer described as onset of shortness of breath in the evening after dinner when she is reading or watching TV or it she starts clearing her throat and feels bloated in the abdomen which makes her more short of breath. Worse if lying down. Denies fever, purulent discharge, palpitation or chest pain. 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 CPAP all night every night. She has been mis- using Advair, saving it for her episodes of dyspnea, so she uses it once at night with 2 or 3 activations. I reeducated her on use as a maintenance inhaler.  03/15/12- 19 yoF former smoker followed for OSA and  COPD, complicated by DM, hx breast cancer, hx DVT.... Here with husband.  Pt states that SOB has improved since last OV. No complaints She feels well and is walking more. Recognizes fluid retention causes shortness of breath. Her diuretic therapy helps. Pleased with CPAP 10/Advanced used all night every night. CXR 02/02/12-reviewed with her IMPRESSION:  Cardiomegaly. COPD/chronic changes. No active disease.  Original Report Authenticated By: Raelyn Number, M.D.   09/13/12- 79 yoF former smoker followed for OSA and COPD, complicated by DM, hx breast cancer, hx DVT.... Here with husband. FOLLOWS FOR:SOB at times and feelings of not being able to breathe-denies this happens with activity; states she goes to bed reading and about 30 minutes later she cant breathe and feels congestion. CPAP 10/ Advanced Is positional dyspnea happens about one time a month and is relieved by sitting up and waiting for it to go away.intends to start before she puts her CPAP on. She has been using either Dulera or Advair intermittently as rescue inhalers which we discussed. She emphasizes that she gets acutely short of breath and will grab for anything. She wasn't satisfied to discuss the difference between maintenance and rescue inhalers. She is being worked up for anemia. Glaucoma is treated with Timoptic which may cause bronchospasm. History of diastolic heart failure followed by cardiology. CXR 05/03/12 IMPRESSION:  Cardiac enlargement with pulmonary vascular congestion and  interstitial edema similar to previous study.  Original Report Authenticated By: Lucienne Capers, M.D.  11/14/12-  53 yoF former smoker followed for OSA and COPD, complicated by  DM, hx breast cancer, hx DVT, hx anemia.... Here with husband. FOLLWS FOR: review PFT with patient; Occasional SOB at times. Continues CPAP 10/ [employer] Reports little cough. BNP 09/13/12- 458, down from 3006 PFT- 11/14/2012-normal spirometry flows without response to  bronchodilator, normal lung volumes, diffusion moderately reduced/55% predicted CXR 09/13/12 IMPRESSION:  Enlargement of cardiac silhouette with pulmonary vascular  congestion.  Minimal chronic bronchitic changes with left basilar atelectasis.  Original Report Authenticated By: Ulyses Southward, M.D.  05/15/13- 52 yoF former smoker followed for OSA and COPD, complicated by DM, hx breast cancer, hx DVT, hx anemia.... Here with husband. FOLLOWS FOR: SOB and wheezing at times(happens all of a sudden); Has had to use Advair  If upset or nervous her breathing gets raspy and short of breath. She sits quietly and takes chlorazepate her breathing becomes comfortable again. Uses Proventil and Advair interchangeably as rescue inhalers-educated on this. Occasional prednisone, about 3 days a month, for arthralgias CPAP 10/Advanced good compliance and control   ROS-see HPI Constitutional:   No-   weight loss, night sweats, fevers, chills, fatigue, lassitude. HEENT:   No-  headaches, difficulty swallowing, tooth/dental problems, sore throat,       No-  sneezing, itching, ear ache, nasal congestion, post nasal drip,  CV:  No-   chest pain, +orthopnea, +PND, swelling in lower extremities, anasarca, dizziness, palpitations Resp: +shortness of breath with exertion or at rest.              No-   productive cough,  No non-productive cough,  No- coughing up of blood.              No-   change in color of mucus.  No- wheezing.   Skin: No-   rash or lesions. GI:  No-   heartburn, indigestion, abdominal pain, nausea, vomiting,  GU:  MS:  + joint pain or swelling.   Neuro-     nothing unusual Psych:  No- change in mood or affect. No depression or anxiety.  No memory loss.  OBJ- Physical Exam General- Alert, Oriented, Affect-appropriate, Distress- none acute, overweight Skin- rash-none, lesions- none, excoriation- none Lymphadenopathy- none Head- atraumatic            Eyes- Gross vision intact, PERRLA,  conjunctivae and secretions clear            Ears- Hearing, canals-normal            Nose- Clear, no-Septal dev, mucus, polyps, erosion, perforation             Throat- Mallampati II , mucosa clear , drainage- none, tonsils- atrophic Neck- flexible , trachea midline, no stridor , thyroid nl, + R carotid scar Chest - symmetrical excursion , unlabored           Heart/CV- RRR , +1/6 systolic Aortic murmur , no gallop  , no rub, nl s1 s2                           - JVD- none , edema+trace, stasis changes- none, varices- none           Lung- clear to P&A, wheeze- none, cough- none , dullness-none, rub- none           Chest wall-  Abd-  Br/ Gen/ Rectal- Not done, not indicated Extrem- cyanosis- none, clubbing, none, atrophy- none, strength- nl Neuro- grossly intact to observation

## 2013-05-15 NOTE — Patient Instructions (Signed)
Use the Advair maintenance inhaler     1 puff then rinse mouth, well, twice daily every day  If you need additional help, you can use the albuterol rescue inhaler   2 puffs, every 4 hours if needed

## 2013-05-16 ENCOUNTER — Telehealth: Payer: Self-pay | Admitting: Oncology

## 2013-05-16 NOTE — Telephone Encounter (Signed)
PER STAFF MESSAGE RESPONSE FROM DESK NURSE - SUSAN PT MAY REMAIN AS SCHEDULED FOR 1/9

## 2013-05-24 ENCOUNTER — Ambulatory Visit (HOSPITAL_BASED_OUTPATIENT_CLINIC_OR_DEPARTMENT_OTHER): Payer: Medicare Other | Admitting: Pharmacist

## 2013-05-24 ENCOUNTER — Other Ambulatory Visit (HOSPITAL_BASED_OUTPATIENT_CLINIC_OR_DEPARTMENT_OTHER): Payer: Medicare Other

## 2013-05-24 DIAGNOSIS — I82409 Acute embolism and thrombosis of unspecified deep veins of unspecified lower extremity: Secondary | ICD-10-CM

## 2013-05-24 DIAGNOSIS — I82402 Acute embolism and thrombosis of unspecified deep veins of left lower extremity: Secondary | ICD-10-CM

## 2013-05-24 DIAGNOSIS — Z7901 Long term (current) use of anticoagulants: Secondary | ICD-10-CM

## 2013-05-24 LAB — PROTIME-INR
INR: 2.7 (ref 2.00–3.50)
Protime: 32.4 Seconds — ABNORMAL HIGH (ref 10.6–13.4)

## 2013-05-24 LAB — POCT INR: INR: 2.7

## 2013-05-24 NOTE — Patient Instructions (Signed)
Continue 2mg  daily.  Recheck INR in 1 month on 06/21/13: lab at 1pm and Coumadin Clinic at 1:15pm.

## 2013-05-24 NOTE — Progress Notes (Signed)
INR within goal today. No changes in diet or medications.  No missed doses. No concerns regarding anticoagulation. No s/s of clotting. Continue 2mg  daily.  Recheck INR in 1 month on 06/21/13: lab at 1pm and Coumadin Clinic at 1:15pm. Will also check a CBC with next visit.  Pt stated she may be switching offices to have her INR checked (possibly Dr. Loren Racer office). It is more convenient and less time consuming. They call her with dosing instructions after lab is completed. Pt and family will call us to let us know if they do switch.

## 2013-05-25 DIAGNOSIS — I739 Peripheral vascular disease, unspecified: Secondary | ICD-10-CM

## 2013-05-25 HISTORY — DX: Peripheral vascular disease, unspecified: I73.9

## 2013-05-26 ENCOUNTER — Ambulatory Visit: Payer: Medicare Other | Admitting: Family

## 2013-05-26 ENCOUNTER — Other Ambulatory Visit (HOSPITAL_COMMUNITY): Payer: Medicare Other

## 2013-05-26 ENCOUNTER — Encounter: Payer: Self-pay | Admitting: Family

## 2013-05-29 ENCOUNTER — Encounter: Payer: Self-pay | Admitting: Family

## 2013-05-29 ENCOUNTER — Ambulatory Visit (HOSPITAL_COMMUNITY)
Admission: RE | Admit: 2013-05-29 | Discharge: 2013-05-29 | Disposition: A | Discharge status: Home / Self Care | Payer: Medicare Other | Source: Ambulatory Visit | Attending: Family | Admitting: Family

## 2013-05-29 ENCOUNTER — Ambulatory Visit (INDEPENDENT_AMBULATORY_CARE_PROVIDER_SITE_OTHER): Payer: Medicare Other | Admitting: Family

## 2013-05-29 DIAGNOSIS — M79609 Pain in unspecified limb: Secondary | ICD-10-CM

## 2013-05-29 DIAGNOSIS — Z48812 Encounter for surgical aftercare following surgery on the circulatory system: Secondary | ICD-10-CM

## 2013-05-29 DIAGNOSIS — M7989 Other specified soft tissue disorders: Secondary | ICD-10-CM

## 2013-05-29 DIAGNOSIS — I6529 Occlusion and stenosis of unspecified carotid artery: Secondary | ICD-10-CM

## 2013-05-29 DIAGNOSIS — M79606 Pain in leg, unspecified: Secondary | ICD-10-CM | POA: Insufficient documentation

## 2013-05-29 DIAGNOSIS — M79605 Pain in left leg: Secondary | ICD-10-CM

## 2013-05-29 NOTE — Progress Notes (Signed)
Established Carotid Patient  History of Present Illness  Wendy Cole is a 78 y.o. female patient of Dr. Bridgett Larsson who is s/p R CEA (01/12/12) and L CEA (11/19/11). The patient has had no stroke sx. She has a history of previous DVT in left leg.  States she does not walk much due to left lower leg swelling that is not present now, but is aggravated by standing or walking. Left lower leg inner aspect pain for about 3 months, denies injury, admits to being sedentary, denies long trips recently.  Does not wear compression hose, states she cannot put on. She states that her oncologist manages her coumadin. States he had a cold recently, has chest congestion, and had trouble breathing from this, but her cold symptoms and breathing seem to be improving. States she has never had IVC filter placed. 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  Patient has Positive history of TIA or stroke symptom 10 years ago as manifested by slurred speech, loss of some degree of balance.  The patient denies amaurosis fugax or monocular blindness.  The patient  denies facial drooping.  Pt Diabetic: No   Pt smoker: former smoker, quit 10 years ago  Pt meds include: Statin : No: cannot tolerate due to severe myalgias ASA: No: Other anticoagulants/antiplatelets: coumadin   Past Medical History  Diagnosis Date  . Anemia   . Anxiety states   . Depressive disorder, not elsewhere classified   . Type II or unspecified type diabetes mellitus without mention of complication, not stated as uncontrolled   . Hyperlipidemia   . Unspecified essential hypertension   . Renal insufficiency   . Osteoarthritis   . Gout   . Iritis   . Nonischemic cardiomyopathy     EF 20 to 25% per echo 06/2011  . Chronic anticoagulation   . NSTEMI (non-ST elevated myocardial infarction) Feb 2013    06/2011 cath  Mild nonobstructive disease. No LV gram  . PAF (paroxysmal atrial fibrillation)   . Obesity   . Cancer 2010    Right breast  .  DVT (deep venous thrombosis) 2010  . Stroke 2004    Left upper lobe  . Thyroid disease 1969    Three fourth of Thyroid removed  . Complication of anesthesia     "fights it", for colonoscopy  . Flash pulmonary edema     06/2011  . HOH (hard of hearing)     deaf in L completely  . OSA on CPAP     CPAP at night  . GERD (gastroesophageal reflux disease)   . Hypothyroidism   . Chronic kidney disease (CKD), stage III (moderate)   . Peripheral vascular disease Jan. 2015    Left leg pain and swelling    Social History History  Substance Use Topics  . Smoking status: Former Smoker -- 2.00 packs/day for 40 years    Types: Cigarettes    Quit date: 05/25/2002  . Smokeless tobacco: Never Used  . Alcohol Use: No    Family History Family History  Problem Relation Age of Onset  . Hypertension    . Heart disease Mother 75    CHF  . Cancer Mother     breast  . Heart disease Father 40    MI  . Heart disease Brother     AAA  . Coronary artery disease Son 40    2 Stents    Surgical History Past Surgical History  Procedure Laterality Date  . Breast  lumpectomy    . Cataract extraction    . Foot surgery    . Tonsillectomy  1949  . Abdominal hysterectomy  1974  . Eye surgery  2008    Glaucoma shunt Right eye  . Parotid gland tumor excision  2000  . Cystectomy  1974    Left Breast, chin, left of groin area  . Thyroidectomy, partial  1969  . Endarterectomy  11/18/2011    Procedure: ENDARTERECTOMY CAROTID;  Surgeon: Conrad Cass Lake, MD;  Location: [facility];  Service: Vascular;  Laterality: Left;  [name] Mastectomy  right  . Mastectomy    . Cardiac catheterization      no stent  . Colonoscopy w/ polypectomy    . Endarterectomy  01/12/2012    Procedure: ENDARTERECTOMY CAROTID;  Surgeon: Conrad Spring Hill, MD;  Location: [facility] OR;  Service: Vascular;  Laterality: Right;  Right carotid endarterectomy with 1cm x 6cm vascuguard patch angioplasty.  . Carotid endarterectomy Left 11-18-11    cea  .  Carotid endarterectomy Right 01-12-12    cea    Allergies  Allergen Reactions  . Amlodipine     edema  . Atorvastatin Other (See Comments)    Muscle aches  . Colesevelam Other (See Comments)    unknown  . Lasix [Furosemide] Other (See Comments)    Muscle cramps  . Statins Other (See Comments)    Muscle aches  . Tape Rash    Current Outpatient Prescriptions  Medication Sig Dispense Refill  . ADVAIR DISKUS 100-50 MCG/DOSE AEPB INHALE 1 PUFF INTO THE LUNGS EVERY 12 HOURS  60 each  5  . albuterol (PROVENTIL HFA;VENTOLIN HFA) 108 (90 BASE) MCG/ACT inhaler Inhale 2 puffs into the lungs every 6 (six) hours as needed for wheezing or shortness of breath.  1 Inhaler  6  . allopurinol (ZYLOPRIM) 100 MG tablet TAKE 1 TABLET three times a week      . anastrozole (ARIMIDEX) 1 MG tablet TAKE 1 TABLET BY MOUTH DAILY  90 tablet  1  . brimonidine (ALPHAGAN) 0.15 % ophthalmic solution Place 1 drop into both eyes daily.       [name] buPROPion (WELLBUTRIN SR) 150 MG 12 hr tablet TAKE 1 TABLET BY MOUTH EVERY DAY  30 tablet  5  . calcium carbonate (CALCIUM 500) 1250 MG tablet Take 1 tablet by mouth. Three days a week      . carvedilol (COREG) 12.5 MG tablet TAKING 2 TABLETS IN THE MORNING AND 1 AT NIGHT  90 tablet  5  . clorazepate (TRANXENE) 7.5 MG tablet Take 1 tablet (7.5 mg total) by mouth 2 (two) times daily as needed for anxiety.  60 tablet  3  . ferrous sulfate 325 (65 FE) MG EC tablet Take 325 mg by mouth daily.      . hydrALAZINE (APRESOLINE) 25 MG tablet Take 1 tablet (25 mg total) by mouth 3 (three) times daily.  90 tablet  11  . isosorbide mononitrate (IMDUR) 60 MG 24 hr tablet TAKE 1 TABLET (60 MG TOTAL) BY MOUTH DAILY.  30 tablet  6  . levothyroxine (SYNTHROID, LEVOTHROID) 137 MCG tablet TAKE 1 TABLET BY MOUTH DAILY  30 tablet  5  . omeprazole (PRILOSEC) 40 MG capsule Take 1 capsule (40 mg total) by mouth daily.  30 capsule  11  . prednisoLONE acetate (PRED FORTE) 1 % ophthalmic suspension Place  1 drop into both eyes daily as needed. For eye pain      . predniSONE (DELTASONE)  5 MG tablet       . timolol (TIMOPTIC) 0.5 % ophthalmic solution Place 1 drop into both eyes 2 (two) times daily. Twice daily      . torsemide (DEMADEX) 10 MG tablet Take 10 mg by mouth daily.      . traMADol (ULTRAM) 50 MG tablet TAKE 1 TABLET BY MOUTH AT BEDTIME FOR SLEEP AND PAIN  100 tablet  1  . vitamin D, CHOLECALCIFEROL, 400 UNITS tablet Take 400 Units by mouth. Three days a week      . warfarin (COUMADIN) 1 MG tablet TAKE 2 TABLETS ON MON & THURS AND 3 TABLETS ON ALL OTHER DAYS OR AS DIRECTED  76 tablet  2  . warfarin (COUMADIN) 3 MG tablet TAKE 1 TABLET (3 MG TOTAL) BY MOUTH DAILY OR AS DIRECTED BY PHYSICIAN.  30 tablet  2   No current facility-administered medications for this visit.    Review of Systems : See HPI for pertinent positives and negatives.  Physical Examination  Filed Vitals:   05/29/13 1159  BP: 138/60  Pulse: 16  Resp: 53   Filed Weights   05/29/13 1159  Weight: 226 lb (102.513 kg)   Body mass index is 38.21 kg/(m^2).   General: WDWN obese female in NAD GAIT: normal Eyes: PERRLA Pulmonary:  CTAB, Negative  Rales, Negative rhonchi, & Negative wheezing.  Cardiac: regular Rhythm ,  Positive low grade Murmur.  VASCULAR EXAM Carotid Bruits Left Right   Negative Negative    Aorta is not palpable. Radial pulses are 2+ palpable and  equal.                                                                                                                            LE Pulses LEFT RIGHT       FEMORAL  not palpable, possibly due to body habitus, obesity  not palpable, possibly due to body habitus, obesity         POPLITEAL  not  palpable   not palpable       POSTERIOR TIBIAL  not palpable    palpable        DORSALIS PEDIS      ANTERIOR TIBIAL  palpable   palpable     Gastrointestinal: soft, nontender, BS WNL, no r/g,  negative masses.  Musculoskeletal: Negative muscle  atrophy/wasting. M/S 5/5 throughout, Extremities without ischemic changes. Both lower legs are equal in size, no edema, no red streaking.   Neurologic: A&O X 3; Appropriate Affect ; SENSATION ;normal;  Speech is normal CN 2-12 intact except is slightly hard of hearing, Pain and light touch intact in extremities, Motor exam as listed above.   Non-Invasive Vascular Imaging CAROTID DUPLEX 05/29/2013   Right ICA: widely patent CEA site. Left ICA: widely patent CEA site.  LLE Venous Duplex (Date: 05/20/12)  Limited study to patient poor tolerance of compression  No acute DVT  Left femoral chronic non-occlusive thrombus  Left  pop reflux  03/24/13 LLE venous Duplex: Apparent resolution of the left femoral vein thrombosis.  No evidence of LLE deep or superficial venous incompetence.  Assessment: Wendy Cole is a 78 y.o. female who presents with no TIA or stroke symptoms in 10 years, bilateral ICA's, CEA sites, are patent, no restenosis. Intermittent left lower leg pain for the last 3 months that is aggravated by walking or standing, has not worsened nor improved over 3 months. No evidence of arterial ischemic changes in either leg or foot, pedal pulses palpable in both feet.  No evidence of LLE DVT from 03/24/13  LLE venous Duplex. Pt is already on Coumadin for the LLE chronic DVT.  Plan: Based on today's exam and carotid Duplex results, and on venous Duplex results from 03/24/13, and after discussing with Dr. Trula Slade, patient advised to follow-up in 1 year with Carotid Duplex scan.  Continue close monitoring of PT/INR by her medical provider, or for any other vascular related problem. Advised to return sooner should lower legs start to swell and hurt.  I discussed in depth with the patient the nature of atherosclerosis, and emphasized the importance of maximal medical management including strict control of blood pressure, blood glucose, and lipid levels, obtaining regular exercise,  and continued cessation of smoking.  The patient is aware that without maximal medical management the underlying atherosclerotic disease process will progress, limiting the benefit of any interventions. The patient was given information about stroke prevention and what symptoms should prompt the patient to seek immediate medical care. Thank you for allowing [place] to participate in this patient's care.  Clemon Chambers, RN, MSN, FNP-C [facility] Office: [phone]  Clinic Physician: Trula Slade  05/29/2013 12:07 PM   VASCULAR QUALITY INITIATIVE FOLLOW UP DATA:  Current smoker: [  ] yes  Valu.Nieves  ] no  Living status: Valu.Nieves  ]  Home  [  ] Nursing home  [  ] Homeless    MEDS:  ASA [  ] yes  Valu.Nieves  ] no- [  ] medical reason  [  ] non compliant  Dulce.Doss  [  ] yes  [ X ] no- Valu.Nieves  ] medical reason  [  ] non compliant  Beta blocker Valu.Nieves  ] yes  [  ] no- [  ] medical reason  [  ] non compliant  ACE inhibitor [  ] yes  [ X ] no- Valu.Nieves  ] medical reason  [  ] non compliant  P2Y12 Antagonist [ X ] none  [  ] clopidogrel-Plavix  [  ] ticlopidine-Ticlid   [  ] prasugrel-Effient  [  ] ticagrelor- Brilinta    Anticoagulant [  ] None  [ X ] warfarin  [  ] rivaroxaban-Xarelto [  ] dabigatran- Pradaxa  Neurologic event since D/C:  Valu.Nieves  ] no  [  ] yes: [  ] eye event  [  ] cortical event  [  ] VB event  [  ] non specific event  [  ] right  [  ] left  [  ] TIA  [  ] stroke  Date:   Modified Rankin Score: 0

## 2013-05-29 NOTE — Patient Instructions (Signed)
Stroke Prevention Some medical conditions and behaviors are associated with an increased chance of having a stroke. You may prevent a stroke by making healthy choices and managing medical conditions. Reduce your risk of having a stroke by:  Staying physically active. Get at least 30 minutes of activity on most or all days.  Not smoking. It may also be helpful to avoid exposure to secondhand smoke.  Limiting alcohol use. Moderate alcohol use is considered to be:  No more than 2 drinks per day for men.  No more than 1 drink per day for nonpregnant women.  Eating healthy foods.  Include 5 or more servings of fruits and vegetables a day.  Certain diets may be prescribed to address high blood pressure, high cholesterol, diabetes, or obesity.  Managing your cholesterol levels.  A low-saturated fat, low-trans fat, low-cholesterol, and high-fiber diet may control cholesterol levels.  Take any prescribed medicines to control cholesterol as directed by your caregiver.  Managing your diabetes.  A controlled-carbohydrate, controlled-sugar diet is recommended to manage diabetes.  Take any prescribed medicines to control diabetes as directed by your caregiver.  Controlling your high blood pressure (hypertension).  A low-salt (sodium), low-saturated fat, low-trans fat, and low-cholesterol diet is recommended to manage high blood pressure.  Take any prescribed medicines to control hypertension as directed by your caregiver.  Maintaining a healthy weight.  A reduced-calorie, low-sodium, low-saturated fat, low-trans fat, low-cholesterol diet is recommended to manage weight.  Stopping drug abuse.  Avoiding birth control pills.  Talk to your caregiver about the risks of taking birth control pills if you are over 35 years old, smoke, get migraines, or have ever had a blood clot.  Getting evaluated for sleep disorders (sleep apnea).  Talk to your caregiver about getting a sleep evaluation  if you snore a lot or have excessive sleepiness.  Taking medicines as directed by your caregiver.  For some people, aspirin or blood thinners (anticoagulants) are helpful in reducing the risk of forming abnormal blood clots that can lead to stroke. If you have the irregular heart rhythm of atrial fibrillation, you should be on a blood thinner unless there is a good reason you cannot take them.  Understand all your medicine instructions. SEEK IMMEDIATE MEDICAL CARE IF:   You have sudden weakness or numbness of the face, arm, or leg, especially on one side of the body.  You have sudden confusion.  You have trouble speaking (aphasia) or understanding.  You have sudden trouble seeing in one or both eyes.  You have sudden trouble walking.  You have dizziness.  You have a loss of balance or coordination.  You have a sudden, severe headache with no known cause.  You have new chest pain or an irregular heartbeat. Any of these symptoms may represent a serious problem that is an emergency. Do not wait to see if the symptoms will go away. Get medical help right away. Call your local emergency services (911 in [place] Do not drive yourself to the hospital. Document Released: 06/18/2004 Document Revised: 08/03/2011 Document Reviewed: 11/11/2012 [employer] Patient Information 2014 [employer].  

## 2013-05-29 NOTE — Addendum Note (Signed)
Addended by: Mena Goes on: 05/29/2013 03:44 PM   Modules accepted: Orders

## 2013-05-30 ENCOUNTER — Other Ambulatory Visit (HOSPITAL_COMMUNITY): Payer: Self-pay | Admitting: Physician Assistant

## 2013-06-02 ENCOUNTER — Ambulatory Visit (HOSPITAL_BASED_OUTPATIENT_CLINIC_OR_DEPARTMENT_OTHER): Payer: Medicare Other | Admitting: Oncology

## 2013-06-02 ENCOUNTER — Other Ambulatory Visit (HOSPITAL_BASED_OUTPATIENT_CLINIC_OR_DEPARTMENT_OTHER): Payer: Medicare Other

## 2013-06-02 VITALS — BP 160/64 | HR 53 | Temp 96.6°F | Resp 18 | Ht 64.5 in | Wt 225.1 lb

## 2013-06-02 DIAGNOSIS — D649 Anemia, unspecified: Secondary | ICD-10-CM

## 2013-06-02 DIAGNOSIS — D509 Iron deficiency anemia, unspecified: Secondary | ICD-10-CM

## 2013-06-02 DIAGNOSIS — I82409 Acute embolism and thrombosis of unspecified deep veins of unspecified lower extremity: Secondary | ICD-10-CM

## 2013-06-02 DIAGNOSIS — N289 Disorder of kidney and ureter, unspecified: Secondary | ICD-10-CM

## 2013-06-02 DIAGNOSIS — J449 Chronic obstructive pulmonary disease, unspecified: Secondary | ICD-10-CM

## 2013-06-02 DIAGNOSIS — C50919 Malignant neoplasm of unspecified site of unspecified female breast: Secondary | ICD-10-CM

## 2013-06-02 LAB — CBC & DIFF AND RETIC
BASO%: 0.7 % (ref 0.0–2.0)
Basophils Absolute: 0.1 10*3/uL (ref 0.0–0.1)
EOS%: 6 % (ref 0.0–7.0)
Eosinophils Absolute: 0.4 10*3/uL (ref 0.0–0.5)
HCT: 31.4 % — ABNORMAL LOW (ref 34.8–46.6)
HGB: 9.5 g/dL — ABNORMAL LOW (ref 11.6–15.9)
Immature Retic Fract: 24.9 % — ABNORMAL HIGH (ref 1.60–10.00)
LYMPH%: 10.3 % — AB (ref 14.0–49.7)
MCH: 27.9 pg (ref 25.1–34.0)
MCHC: 30.3 g/dL — AB (ref 31.5–36.0)
MCV: 92.4 fL (ref 79.5–101.0)
MONO#: 0.4 10*3/uL (ref 0.1–0.9)
MONO%: 5.6 % (ref 0.0–14.0)
NEUT#: 5.4 10*3/uL (ref 1.5–6.5)
NEUT%: 77.4 % — ABNORMAL HIGH (ref 38.4–76.8)
PLATELETS: 287 10*3/uL (ref 145–400)
RBC: 3.4 10*6/uL — AB (ref 3.70–5.45)
RDW: 16.6 % — ABNORMAL HIGH (ref 11.2–14.5)
Retic %: 4.48 % — ABNORMAL HIGH (ref 0.70–2.10)
Retic Ct Abs: 152.32 10*3/uL — ABNORMAL HIGH (ref 33.70–90.70)
WBC: 7 10*3/uL (ref 3.9–10.3)
lymph#: 0.7 10*3/uL — ABNORMAL LOW (ref 0.9–3.3)

## 2013-06-02 LAB — COMPREHENSIVE METABOLIC PANEL (CC13)
ALBUMIN: 3.4 g/dL — AB (ref 3.5–5.0)
ALK PHOS: 78 U/L (ref 40–150)
ALT: 15 U/L (ref 0–55)
AST: 17 U/L (ref 5–34)
Anion Gap: 9 mEq/L (ref 3–11)
BUN: 21.6 mg/dL (ref 7.0–26.0)
CO2: 22 mEq/L (ref 22–29)
Calcium: 9 mg/dL (ref 8.4–10.4)
Chloride: 109 mEq/L (ref 98–109)
Creatinine: 1.7 mg/dL — ABNORMAL HIGH (ref 0.6–1.1)
GLUCOSE: 108 mg/dL (ref 70–140)
Potassium: 4.7 mEq/L (ref 3.5–5.1)
Sodium: 139 mEq/L (ref 136–145)
TOTAL PROTEIN: 6.5 g/dL (ref 6.4–8.3)
Total Bilirubin: 0.5 mg/dL (ref 0.20–1.20)

## 2013-06-02 LAB — CHCC SMEAR

## 2013-06-02 NOTE — Progress Notes (Signed)
   [place]    OFFICE PROGRESS NOTE   INTERVAL HISTORY:   She returns for scheduled followup of breast cancer. She continues the Arimidex. Persistent mild swelling and discomfort at the left lower leg. She has been anemic for the past several months. No bleeding.  Objective:  Vital signs in last 24 hours:  Blood pressure 160/64, pulse 53, temperature 96.6 F (35.9 C), temperature source Oral, resp. rate 18, height 5' 4.5" (1.638 m), weight 225 lb 1.6 oz (102.105 kg).    HEENT: Neck without mass Lymphatics: No cervical, supraclavicular, or axillary nodes Resp: Lungs clear bilaterally Cardio: Regular rate and rhythm GI: No hepatosplenomegaly Vascular: Trace edema at the left lower leg without erythema  Breast: Status post right mastectomy. No evidence for chest wall tumor recurrence. Left breast without mass.     Lab Results:  Lab Results  Component Value Date   WBC 7.0 06/02/2013   HGB 9.5* 06/02/2013   HCT 31.4* 06/02/2013   MCV 92.4 06/02/2013   PLT 287 06/02/2013   NEUTROABS 5.4 06/02/2013    Blood smear: Mild increased polychromasia, a few band forms and bilobed neutrophils, normal platelets, the red cell morphology is unremarkable  Medications: I have reviewed the patient's current medications.  Assessment/Plan: 1. Extensive left lower extremity deep vein thrombosis. Maintained on anticoagulation therapy since she was diagnosed with a deep venous thrombosis, 01/31/2009.  a. She was treated with Lovenox followed by heparin anticoagulation while hospitalized, and she was then maintained on Lovenox prior to surgery on 03/02/2009.  b. Now maintained on therapeutic Coumadin anticoagulation.  2. Diagnosed with synchronous T1, ER positive, PR positive, and HER-2 negative right-sided breast cancer on 01/09/2009. a. Status post a right mastectomy and sentinel lymph node biopsy on 03/22/2009 with the pathology confirming two foci of invasive carcinoma measuring 0.6  and 0.4 cm. The invasive tumor was grade 1 with associated low-grade ductal carcinoma in situ. No lymphovascular invasion was identified, and three right-sided sentinel lymph nodes were negative for metastatic carcinoma.  b. Initiation of adjuvant Arimidex on 04/05/2009. 3. Chronic obstructive pulmonary disease. 4. History of a cerebrovascular accident. 5. Gout. 6. Obstructive sleep apnea. 7. History of renal insufficiency. 8. Anemia-severe microcytic anemia noted on 07/20/2012-anemia persist,? Secondary to iron deficiency or renal insufficiency.  9. Admission with "flash "pulmonary edema in February of 2013. 10. Left carotid endarterectomy June 2013, right carotid endarterectomy August 2013  Disposition:  She remains in clinical remission from breast cancer. She continues Coumadin anticoagulation. She requests a transfer to the [place] [facility] clinic.  She has persistent anemia. The anemia could be in part related to iron deficiency or bleeding. We will ask her to return stool Hemoccult cards. The anemia may be secondary to renal insufficiency. We will consider a trial of erythropoietin if the anemia progresses.   Betsy Coder, MD  06/02/2013  5:22 PM

## 2013-06-02 NOTE — Progress Notes (Signed)
Called referral to [place] will contact pt.

## 2013-06-05 ENCOUNTER — Telehealth: Payer: Self-pay | Admitting: Oncology

## 2013-06-05 NOTE — Telephone Encounter (Signed)
Gave pt appt for lab and Md for April 2015 °

## 2013-06-06 ENCOUNTER — Other Ambulatory Visit: Payer: Self-pay | Admitting: *Deleted

## 2013-06-07 ENCOUNTER — Telehealth: Payer: Self-pay | Admitting: *Deleted

## 2013-06-07 ENCOUNTER — Telehealth: Payer: Self-pay | Admitting: Oncology

## 2013-06-07 ENCOUNTER — Other Ambulatory Visit: Payer: Self-pay | Admitting: *Deleted

## 2013-06-07 DIAGNOSIS — D649 Anemia, unspecified: Secondary | ICD-10-CM

## 2013-06-07 NOTE — Telephone Encounter (Signed)
Called pt and left message regarding labs for january and february 2015

## 2013-06-07 NOTE — Telephone Encounter (Signed)
Per Dr. Benay Spice instructions after office visit on 06/02/13: Refer to [facility] per patient request. 2)Stool hemoccult cards, U/A in next 2 weeks 3)CBC, ferritin,retic,DAT in 1 month 4)Increase ferrous sulfate to 325 mg twice daily. Left this detailed voice mail on her home machine and will also send My Chart message-

## 2013-06-10 NOTE — Assessment & Plan Note (Signed)
Continue CPAP 10/ Advanced

## 2013-06-10 NOTE — Assessment & Plan Note (Signed)
Dyspnea strongly associated with episodes of anxiety

## 2013-06-10 NOTE — Assessment & Plan Note (Signed)
Division shortness of breath especially if she is anxious.

## 2013-06-20 ENCOUNTER — Encounter: Payer: Self-pay | Admitting: Pharmacist

## 2013-06-20 NOTE — Progress Notes (Signed)
I s/w pts husband over the phone today.  He stated it is inconvenient for them to come to the Coumadin clinic at [facility]. Dr. Benay Spice has given ok for pt to go back to [facility] Coumadin clinic.  They'll go there starting tomorrow 06/21/13. I have archived pt from [facility] Coumadin clinic. Kennith Center, Pharm.D., CPP 06/20/2013@2 :05 PM

## 2013-06-21 ENCOUNTER — Ambulatory Visit (INDEPENDENT_AMBULATORY_CARE_PROVIDER_SITE_OTHER): Payer: Medicare Other | Admitting: General Practice

## 2013-06-21 ENCOUNTER — Other Ambulatory Visit: Payer: Medicare Other

## 2013-06-21 ENCOUNTER — Other Ambulatory Visit (HOSPITAL_BASED_OUTPATIENT_CLINIC_OR_DEPARTMENT_OTHER): Payer: Medicare Other

## 2013-06-21 ENCOUNTER — Ambulatory Visit: Payer: Medicare Other

## 2013-06-21 DIAGNOSIS — I82402 Acute embolism and thrombosis of unspecified deep veins of left lower extremity: Secondary | ICD-10-CM

## 2013-06-21 DIAGNOSIS — I82409 Acute embolism and thrombosis of unspecified deep veins of unspecified lower extremity: Secondary | ICD-10-CM

## 2013-06-21 DIAGNOSIS — D649 Anemia, unspecified: Secondary | ICD-10-CM

## 2013-06-21 DIAGNOSIS — Z5181 Encounter for therapeutic drug level monitoring: Secondary | ICD-10-CM | POA: Insufficient documentation

## 2013-06-21 LAB — URINALYSIS, MICROSCOPIC - CHCC
Bilirubin (Urine): NEGATIVE
Blood: NEGATIVE
Glucose: NEGATIVE mg/dL
Ketones: NEGATIVE mg/dL
Leukocyte Esterase: NEGATIVE
Nitrite: NEGATIVE
PROTEIN: 100 mg/dL
RBC / HPF: NEGATIVE (ref 0–2)
SPECIFIC GRAVITY, URINE: 1.01 (ref 1.003–1.035)
UROBILINOGEN UR: 0.2 mg/dL (ref 0.2–1)
pH: 6 (ref 4.6–8.0)

## 2013-06-21 LAB — CBC WITH DIFFERENTIAL/PLATELET
BASO%: 1.3 % (ref 0.0–2.0)
Basophils Absolute: 0.1 10*3/uL (ref 0.0–0.1)
EOS%: 5.3 % (ref 0.0–7.0)
Eosinophils Absolute: 0.4 10*3/uL (ref 0.0–0.5)
HEMATOCRIT: 33.5 % — AB (ref 34.8–46.6)
HGB: 10.9 g/dL — ABNORMAL LOW (ref 11.6–15.9)
LYMPH%: 6.4 % — AB (ref 14.0–49.7)
MCH: 30.1 pg (ref 25.1–34.0)
MCHC: 32.5 g/dL (ref 31.5–36.0)
MCV: 92.9 fL (ref 79.5–101.0)
MONO#: 0.4 10*3/uL (ref 0.1–0.9)
MONO%: 6.7 % (ref 0.0–14.0)
NEUT#: 5.3 10*3/uL (ref 1.5–6.5)
NEUT%: 80.3 % — AB (ref 38.4–76.8)
PLATELETS: 260 10*3/uL (ref 145–400)
RBC: 3.6 10*6/uL — ABNORMAL LOW (ref 3.70–5.45)
RDW: 17.1 % — ABNORMAL HIGH (ref 11.2–14.5)
WBC: 6.6 10*3/uL (ref 3.9–10.3)
lymph#: 0.4 10*3/uL — ABNORMAL LOW (ref 0.9–3.3)

## 2013-06-21 LAB — FECAL OCCULT BLOOD, GUAIAC: OCCULT BLOOD: POSITIVE

## 2013-06-21 LAB — POCT INR: INR: 2.5

## 2013-06-21 NOTE — Progress Notes (Signed)
Pre-visit discussion using our clinic review tool. No additional management support is needed unless otherwise documented below in the visit note.  

## 2013-06-23 ENCOUNTER — Telehealth: Payer: Self-pay | Admitting: *Deleted

## 2013-06-23 NOTE — Telephone Encounter (Signed)
Left VM to call office regarding lab results. 

## 2013-06-23 NOTE — Telephone Encounter (Signed)
Message copied by Tania Ade on Fri Jun 23, 2013  2:35 PM ------      Message from: Betsy Coder B      Created: Thu Jun 22, 2013  9:05 PM       Please call patient, hb is better, continue iron      Stool is positive for blood.  Be sure she has had recent colonoscopy ------

## 2013-06-26 ENCOUNTER — Telehealth: Payer: Self-pay | Admitting: *Deleted

## 2013-06-26 NOTE — Telephone Encounter (Signed)
Pt and husband returned call. Lab results given. She reports she had a colonoscopy with Dr. Amedeo Plenty about 2 mos ago. She understands to continue oral iron. Will forward result of hemoccult lab to Dr. Amedeo Plenty.

## 2013-07-07 ENCOUNTER — Other Ambulatory Visit (HOSPITAL_BASED_OUTPATIENT_CLINIC_OR_DEPARTMENT_OTHER): Payer: Medicare Other

## 2013-07-07 DIAGNOSIS — D649 Anemia, unspecified: Secondary | ICD-10-CM

## 2013-07-07 LAB — CBC & DIFF AND RETIC
BASO%: 1 % (ref 0.0–2.0)
Basophils Absolute: 0.1 10*3/uL (ref 0.0–0.1)
EOS%: 4.6 % (ref 0.0–7.0)
Eosinophils Absolute: 0.3 10*3/uL (ref 0.0–0.5)
HCT: 34 % — ABNORMAL LOW (ref 34.8–46.6)
HGB: 10.7 g/dL — ABNORMAL LOW (ref 11.6–15.9)
IMMATURE RETIC FRACT: 18.2 % — AB (ref 1.60–10.00)
LYMPH#: 0.5 10*3/uL — AB (ref 0.9–3.3)
LYMPH%: 7.3 % — ABNORMAL LOW (ref 14.0–49.7)
MCH: 30.1 pg (ref 25.1–34.0)
MCHC: 31.5 g/dL (ref 31.5–36.0)
MCV: 95.5 fL (ref 79.5–101.0)
MONO#: 0.5 10*3/uL (ref 0.1–0.9)
MONO%: 6.7 % (ref 0.0–14.0)
NEUT#: 5.5 10*3/uL (ref 1.5–6.5)
NEUT%: 80.4 % — ABNORMAL HIGH (ref 38.4–76.8)
Platelets: 238 10*3/uL (ref 145–400)
RBC: 3.56 10*6/uL — ABNORMAL LOW (ref 3.70–5.45)
RDW: 16.3 % — AB (ref 11.2–14.5)
RETIC CT ABS: 137.06 10*3/uL — AB (ref 33.70–90.70)
Retic %: 3.85 % — ABNORMAL HIGH (ref 0.70–2.10)
WBC: 6.9 10*3/uL (ref 3.9–10.3)

## 2013-07-07 LAB — FERRITIN CHCC: FERRITIN: 56 ng/mL (ref 9–269)

## 2013-07-08 LAB — DIRECT ANTIGLOBULIN TEST (NOT AT ARMC)
DAT (Complement): NEGATIVE
DAT IgG: NEGATIVE

## 2013-07-09 ENCOUNTER — Other Ambulatory Visit: Payer: Self-pay | Admitting: Internal Medicine

## 2013-07-14 ENCOUNTER — Ambulatory Visit (INDEPENDENT_AMBULATORY_CARE_PROVIDER_SITE_OTHER): Payer: Medicare Other | Admitting: General Practice

## 2013-07-14 DIAGNOSIS — I82509 Chronic embolism and thrombosis of unspecified deep veins of unspecified lower extremity: Secondary | ICD-10-CM

## 2013-07-14 DIAGNOSIS — Z86718 Personal history of other venous thrombosis and embolism: Secondary | ICD-10-CM

## 2013-07-14 DIAGNOSIS — I82409 Acute embolism and thrombosis of unspecified deep veins of unspecified lower extremity: Secondary | ICD-10-CM

## 2013-07-14 DIAGNOSIS — I82402 Acute embolism and thrombosis of unspecified deep veins of left lower extremity: Secondary | ICD-10-CM

## 2013-07-14 DIAGNOSIS — Z7901 Long term (current) use of anticoagulants: Secondary | ICD-10-CM

## 2013-07-14 LAB — POCT INR: INR: 2.3

## 2013-07-14 NOTE — Progress Notes (Signed)
Pre visit review using our clinic review tool, if applicable. No additional management support is needed unless otherwise documented below in the visit note. 

## 2013-07-18 ENCOUNTER — Other Ambulatory Visit (HOSPITAL_COMMUNITY): Payer: Self-pay | Admitting: Physician Assistant

## 2013-08-02 ENCOUNTER — Other Ambulatory Visit: Payer: Self-pay | Admitting: Internal Medicine

## 2013-08-11 ENCOUNTER — Ambulatory Visit (INDEPENDENT_AMBULATORY_CARE_PROVIDER_SITE_OTHER): Payer: Medicare Other | Admitting: General Practice

## 2013-08-11 ENCOUNTER — Ambulatory Visit (INDEPENDENT_AMBULATORY_CARE_PROVIDER_SITE_OTHER): Payer: Medicare Other | Admitting: Internal Medicine

## 2013-08-11 ENCOUNTER — Encounter: Payer: Self-pay | Admitting: Internal Medicine

## 2013-08-11 VITALS — BP 122/60 | HR 60 | Temp 97.4°F | Resp 16 | Wt 223.0 lb

## 2013-08-11 DIAGNOSIS — E119 Type 2 diabetes mellitus without complications: Secondary | ICD-10-CM

## 2013-08-11 DIAGNOSIS — E785 Hyperlipidemia, unspecified: Secondary | ICD-10-CM

## 2013-08-11 DIAGNOSIS — Z86718 Personal history of other venous thrombosis and embolism: Secondary | ICD-10-CM

## 2013-08-11 DIAGNOSIS — M353 Polymyalgia rheumatica: Secondary | ICD-10-CM

## 2013-08-11 DIAGNOSIS — E039 Hypothyroidism, unspecified: Secondary | ICD-10-CM

## 2013-08-11 DIAGNOSIS — I82509 Chronic embolism and thrombosis of unspecified deep veins of unspecified lower extremity: Secondary | ICD-10-CM

## 2013-08-11 DIAGNOSIS — M545 Low back pain, unspecified: Secondary | ICD-10-CM

## 2013-08-11 DIAGNOSIS — R7309 Other abnormal glucose: Secondary | ICD-10-CM

## 2013-08-11 DIAGNOSIS — Z7901 Long term (current) use of anticoagulants: Secondary | ICD-10-CM

## 2013-08-11 DIAGNOSIS — I82409 Acute embolism and thrombosis of unspecified deep veins of unspecified lower extremity: Secondary | ICD-10-CM

## 2013-08-11 DIAGNOSIS — I82402 Acute embolism and thrombosis of unspecified deep veins of left lower extremity: Secondary | ICD-10-CM

## 2013-08-11 DIAGNOSIS — I6529 Occlusion and stenosis of unspecified carotid artery: Secondary | ICD-10-CM

## 2013-08-11 DIAGNOSIS — R739 Hyperglycemia, unspecified: Secondary | ICD-10-CM

## 2013-08-11 DIAGNOSIS — Z5181 Encounter for therapeutic drug level monitoring: Secondary | ICD-10-CM

## 2013-08-11 LAB — POCT INR: INR: 1.7

## 2013-08-11 MED ORDER — TRAMADOL HCL 50 MG PO TABS: 50.0000 mg | ORAL_TABLET | Freq: Two times a day (BID) | ORAL | Status: DC | PRN

## 2013-08-11 NOTE — Progress Notes (Signed)
Subjective:    HPI  F/u L foot and shin swelling/pain x 2 wks  F/u LBP - not better The patient presents for a follow-up of  chronic hypertension, chronic dyslipidemia, type 2 diabetes, h/o CHF controlled with medicines. C/o severe pain in thor back and B neck/head pain L>R; stiff - taking Aleve and Tramadol F/u occ R temporal-occip HA   Wt Readings from Last 3 Encounters:  08/11/13 223 lb (101.152 kg)  06/02/13 225 lb 1.6 oz (102.105 kg)  05/29/13 226 lb (102.513 kg)   BP Readings from Last 3 Encounters:  08/11/13 122/60  06/02/13 160/64  05/29/13 138/60    Review of Systems  Constitutional: Positive for fatigue. Negative for chills, activity change, appetite change and unexpected weight change.  HENT: Negative for congestion, mouth sores and sinus pressure.   Eyes: Negative for visual disturbance.  Respiratory: Positive for shortness of breath. Negative for cough and chest tightness.   Cardiovascular: Negative for chest pain.  Gastrointestinal: Negative for nausea, vomiting, abdominal pain, blood in stool and abdominal distention.  Genitourinary: Negative for frequency, difficulty urinating and vaginal pain.  Musculoskeletal: Positive for arthralgias, back pain, gait problem and myalgias. Negative for joint swelling.  Skin: Negative for pallor and rash.  Neurological: Negative for dizziness, tremors, weakness, numbness and headaches.  Psychiatric/Behavioral: Negative for suicidal ideas, confusion and sleep disturbance.        Objective:   Physical Exam  Constitutional: She appears well-developed. No distress.  Obese A little pale  HENT:  Head: Normocephalic.  Right Ear: External ear normal.  Left Ear: External ear normal.  Nose: Nose normal.  Mouth/Throat: Oropharynx is clear and moist.  Eyes: Conjunctivae are normal. Pupils are equal, round, and reactive to light. Right eye exhibits no discharge. Left eye exhibits no discharge.  Neck: Normal range of motion.  Neck supple. No JVD present. No tracheal deviation present. No thyromegaly present.  Cardiovascular: Normal rate, regular rhythm and normal heart sounds.   Pulmonary/Chest: No stridor. No respiratory distress. She has no wheezes.  Abdominal: Soft. Bowel sounds are normal. She exhibits no distension and no mass. There is no tenderness. There is no rebound and no guarding.  Musculoskeletal: She exhibits no edema and no tenderness.  Lymphadenopathy:    She has no cervical adenopathy.  Neurological: She displays normal reflexes. No cranial nerve deficit. She exhibits normal muscle tone. Coordination normal.  cane  Skin: No rash noted. No erythema.  Psychiatric: She has a normal mood and affect. Her behavior is normal. Judgment and thought content normal.  L foot and L calf is swollen, tender  Lab Results  Component Value Date   WBC 6.9 07/07/2013   HGB 10.7* 07/07/2013   HCT 34.0* 07/07/2013   PLT 238 07/07/2013   GLUCOSE 108 06/02/2013   CHOL 218* 05/08/2013   TRIG 354.0* 05/08/2013   HDL 28.60* 05/08/2013   LDLDIRECT 147.8 05/08/2013   LDLCALC  Value: 154        Total Cholesterol/HDL:CHD Risk Coronary Heart Disease Risk Table                     Men   Women  1/2 Average Risk   3.4   3.3  Average Risk       5.0   4.4  2 X Average Risk   9.6   7.1  3 X Average Risk  23.4   11.0        Use the calculated Patient Ratio  above and the CHD Risk Table to determine the patient's CHD Risk.        ATP III CLASSIFICATION (LDL):  <100     mg/dL   Optimal  100-129  mg/dL   Near or Above                    Optimal  130-159  mg/dL   Borderline  160-189  mg/dL   High  >190     mg/dL   Very High* 02/01/2009   ALT 15 06/02/2013   AST 17 06/02/2013   NA 139 06/02/2013   K 4.7 06/02/2013   CL 104 05/08/2013   CREATININE 1.7* 06/02/2013   BUN 21.6 06/02/2013   CO2 22 06/02/2013   TSH 3.74 05/08/2013   INR 1.7 08/11/2013   HGBA1C 5.5 05/08/2013   MICROALBUR 3.1* 09/27/2006         Assessment & Plan:

## 2013-08-11 NOTE — Progress Notes (Signed)
Pre visit review using our clinic review tool, if applicable. No additional management support is needed unless otherwise documented below in the visit note. 

## 2013-08-11 NOTE — Assessment & Plan Note (Signed)
Continue with current prescription therapy as reflected on the Med list.  

## 2013-08-24 ENCOUNTER — Other Ambulatory Visit (HOSPITAL_BASED_OUTPATIENT_CLINIC_OR_DEPARTMENT_OTHER): Payer: Medicare Other

## 2013-08-24 ENCOUNTER — Telehealth: Payer: Self-pay | Admitting: Oncology

## 2013-08-24 ENCOUNTER — Ambulatory Visit: Payer: Medicare Other | Admitting: Oncology

## 2013-08-24 ENCOUNTER — Other Ambulatory Visit: Payer: Medicare Other

## 2013-08-24 ENCOUNTER — Ambulatory Visit (HOSPITAL_BASED_OUTPATIENT_CLINIC_OR_DEPARTMENT_OTHER): Payer: Medicare Other | Admitting: Nurse Practitioner

## 2013-08-24 VITALS — BP 161/65 | HR 69 | Temp 98.5°F | Resp 20 | Ht 64.5 in | Wt 225.4 lb

## 2013-08-24 DIAGNOSIS — J449 Chronic obstructive pulmonary disease, unspecified: Secondary | ICD-10-CM

## 2013-08-24 DIAGNOSIS — D509 Iron deficiency anemia, unspecified: Secondary | ICD-10-CM

## 2013-08-24 DIAGNOSIS — I82409 Acute embolism and thrombosis of unspecified deep veins of unspecified lower extremity: Secondary | ICD-10-CM

## 2013-08-24 DIAGNOSIS — D649 Anemia, unspecified: Secondary | ICD-10-CM

## 2013-08-24 DIAGNOSIS — N289 Disorder of kidney and ureter, unspecified: Secondary | ICD-10-CM

## 2013-08-24 DIAGNOSIS — Z8673 Personal history of transient ischemic attack (TIA), and cerebral infarction without residual deficits: Secondary | ICD-10-CM

## 2013-08-24 DIAGNOSIS — C50919 Malignant neoplasm of unspecified site of unspecified female breast: Secondary | ICD-10-CM

## 2013-08-24 LAB — CBC & DIFF AND RETIC
BASO%: 0.8 % (ref 0.0–2.0)
Basophils Absolute: 0.1 10*3/uL (ref 0.0–0.1)
EOS%: 4.3 % (ref 0.0–7.0)
Eosinophils Absolute: 0.3 10*3/uL (ref 0.0–0.5)
HCT: 36 % (ref 34.8–46.6)
HGB: 11.4 g/dL — ABNORMAL LOW (ref 11.6–15.9)
Immature Retic Fract: 4.5 % (ref 1.60–10.00)
LYMPH%: 8.7 % — AB (ref 14.0–49.7)
MCH: 29.7 pg (ref 25.1–34.0)
MCHC: 31.7 g/dL (ref 31.5–36.0)
MCV: 93.8 fL (ref 79.5–101.0)
MONO#: 0.5 10*3/uL (ref 0.1–0.9)
MONO%: 7 % (ref 0.0–14.0)
NEUT#: 6.1 10*3/uL (ref 1.5–6.5)
NEUT%: 79.2 % — ABNORMAL HIGH (ref 38.4–76.8)
NRBC: 0 % (ref 0–0)
PLATELETS: 227 10*3/uL (ref 145–400)
RBC: 3.84 10*6/uL (ref 3.70–5.45)
RDW: 15.2 % — AB (ref 11.2–14.5)
RETIC %: 1.98 % (ref 0.70–2.10)
Retic Ct Abs: 76.03 10*3/uL (ref 33.70–90.70)
WBC: 7.7 10*3/uL (ref 3.9–10.3)
lymph#: 0.7 10*3/uL — ABNORMAL LOW (ref 0.9–3.3)

## 2013-08-24 NOTE — Telephone Encounter (Signed)
gv and printed appt sched and avs for pt for Aug °

## 2013-08-24 NOTE — Progress Notes (Addendum)
[place] OFFICE PROGRESS NOTE   Diagnosis:  Breast cancer, DVT and anemia.  INTERVAL HISTORY:   She returns as scheduled. She continues Arimidex. She denies hot flashes. She has joint pains which predated the start of Arimidex. She is unsure if the joint pains worsened following Arimidex. No changes over the chest wall. She notes intermittent swelling of the left leg. Current Coumadin dose is 2 mg daily.  She continues oral iron. She denies bleeding. She reports having a colonoscopy within the past 4-5 months.  Objective:  Vital signs in last 24 hours:  Blood pressure 161/65, pulse 69, temperature 98.5 F (36.9 C), temperature source Oral, resp. rate 20, height 5' 4.5" (1.638 m), weight 225 lb 6.4 oz (102.241 kg), SpO2 100.00%.    HEENT: No thrush or ulcerations. Lymphatics: No palpable cervical, supraclavicular or axillary lymph nodes. Resp: Lungs clear. Cardio: Regular cardiac rhythm. GI: Abdomen is soft. Tender over the right upper quadrant with palpation. No hepatomegaly. Vascular: Trace edema left lower leg. Breast: Status post right mastectomy. No evidence for chest wall tumor recurrence.   Lab Results:  Lab Results  Component Value Date   WBC 7.7 08/24/2013   HGB 11.4* 08/24/2013   HCT 36.0 08/24/2013   MCV 93.8 08/24/2013   PLT 227 08/24/2013   NEUTROABS 6.1 08/24/2013      Imaging:  No results found.  Medications: I have reviewed the patient's current medications.  Assessment/Plan: 1. Extensive left lower extremity deep vein thrombosis. Maintained on anticoagulation therapy since she was diagnosed with a deep venous thrombosis, 01/31/2009.  a. She was treated with Lovenox followed by heparin anticoagulation while hospitalized, and she was then maintained on Lovenox prior to surgery on 03/02/2009.  b. Now maintained on therapeutic Coumadin anticoagulation.  2. Diagnosed with synchronous T1, ER positive, PR positive, and HER-2 negative right-sided  breast cancer on 01/09/2009. a. Status post a right mastectomy and sentinel lymph node biopsy on 03/22/2009 with the pathology confirming two foci of invasive carcinoma measuring 0.6 and 0.4 cm. The invasive tumor was grade 1 with associated low-grade ductal carcinoma in situ. No lymphovascular invasion was identified, and three right-sided sentinel lymph nodes were negative for metastatic carcinoma.  b. Initiation of adjuvant Arimidex on 04/05/2009. 3. Chronic obstructive pulmonary disease. 4. History of a cerebrovascular accident. 5. Gout. 6. Obstructive sleep apnea. 7. History of renal insufficiency. 8. Anemia-severe microcytic anemia noted on 07/20/2012. Question secondary to iron deficiency or renal insufficiency. Stool Hemoccult positive x3 on 06/21/2013. 9. Admission with "flash "pulmonary edema in February of 2013. 10. Left carotid endarterectomy June 2013, right carotid endarterectomy August 2013   Disposition: She remains in clinical remission from breast cancer. She will continue Arimidex through November 2015 to complete 5 years.  She continues Coumadin now followed at the [facility] Coumadin clinic.  The hemoglobin has  partially corrected on the increased dose of oral iron. Dr. Benay Spice recommends she continue the same. She is aware the stool was Hemoccult positive in January 2015. She reports having a colonoscopy 4-5 months ago. She declines a referral to Dr. Amedeo Plenty for consideration of an upper endoscopy.   She will return for a followup visit in August 2015. She will contact the office in the interim with any problems.  Patient seen with Dr. Benay Spice.     Ned Card ANP/GNP-BC   08/24/2013  3:45 PM  This was a shared visit with Ned Card. Ms. Asbill declines endoscopic evaluation of the anemia. She will continue iron.  The Coumadin anticoagulation is now managed via the [place] [facility] clinic.  Julieanne Manson, M.D.

## 2013-08-25 ENCOUNTER — Other Ambulatory Visit: Payer: Self-pay | Admitting: Internal Medicine

## 2013-08-30 ENCOUNTER — Other Ambulatory Visit: Payer: Self-pay | Admitting: Oncology

## 2013-08-30 DIAGNOSIS — Z86718 Personal history of other venous thrombosis and embolism: Secondary | ICD-10-CM

## 2013-09-06 ENCOUNTER — Other Ambulatory Visit: Payer: Self-pay | Admitting: Internal Medicine

## 2013-09-08 ENCOUNTER — Ambulatory Visit (INDEPENDENT_AMBULATORY_CARE_PROVIDER_SITE_OTHER): Payer: Medicare Other | Admitting: General Practice

## 2013-09-08 ENCOUNTER — Other Ambulatory Visit: Payer: Self-pay

## 2013-09-08 DIAGNOSIS — I82509 Chronic embolism and thrombosis of unspecified deep veins of unspecified lower extremity: Secondary | ICD-10-CM

## 2013-09-08 DIAGNOSIS — I82402 Acute embolism and thrombosis of unspecified deep veins of left lower extremity: Secondary | ICD-10-CM

## 2013-09-08 DIAGNOSIS — Z7901 Long term (current) use of anticoagulants: Secondary | ICD-10-CM

## 2013-09-08 DIAGNOSIS — Z86718 Personal history of other venous thrombosis and embolism: Secondary | ICD-10-CM

## 2013-09-08 DIAGNOSIS — I82409 Acute embolism and thrombosis of unspecified deep veins of unspecified lower extremity: Secondary | ICD-10-CM

## 2013-09-08 DIAGNOSIS — Z5181 Encounter for therapeutic drug level monitoring: Secondary | ICD-10-CM

## 2013-09-08 LAB — POCT INR: INR: 2

## 2013-09-08 MED ORDER — HYDRALAZINE HCL 25 MG PO TABS: ORAL_TABLET | ORAL | Status: DC

## 2013-09-08 NOTE — Progress Notes (Signed)
Pre visit review using our clinic review tool, if applicable. No additional management support is needed unless otherwise documented below in the visit note. 

## 2013-09-26 ENCOUNTER — Other Ambulatory Visit: Payer: Self-pay | Admitting: Oncology

## 2013-09-26 DIAGNOSIS — C50919 Malignant neoplasm of unspecified site of unspecified female breast: Secondary | ICD-10-CM

## 2013-10-10 ENCOUNTER — Ambulatory Visit (INDEPENDENT_AMBULATORY_CARE_PROVIDER_SITE_OTHER): Payer: Medicare Other | Admitting: General Practice

## 2013-10-10 DIAGNOSIS — I82402 Acute embolism and thrombosis of unspecified deep veins of left lower extremity: Secondary | ICD-10-CM

## 2013-10-10 DIAGNOSIS — I82409 Acute embolism and thrombosis of unspecified deep veins of unspecified lower extremity: Secondary | ICD-10-CM

## 2013-10-10 DIAGNOSIS — Z5181 Encounter for therapeutic drug level monitoring: Secondary | ICD-10-CM

## 2013-10-10 DIAGNOSIS — I82509 Chronic embolism and thrombosis of unspecified deep veins of unspecified lower extremity: Secondary | ICD-10-CM

## 2013-10-10 DIAGNOSIS — Z86718 Personal history of other venous thrombosis and embolism: Secondary | ICD-10-CM

## 2013-10-10 DIAGNOSIS — Z7901 Long term (current) use of anticoagulants: Secondary | ICD-10-CM

## 2013-10-10 LAB — POCT INR: INR: 2

## 2013-10-10 NOTE — Progress Notes (Signed)
Pre visit review using our clinic review tool, if applicable. No additional management support is needed unless otherwise documented below in the visit note. 

## 2013-10-13 ENCOUNTER — Other Ambulatory Visit: Payer: Self-pay | Admitting: Internal Medicine

## 2013-10-14 ENCOUNTER — Other Ambulatory Visit: Payer: Self-pay | Admitting: Internal Medicine

## 2013-10-30 ENCOUNTER — Other Ambulatory Visit: Payer: Self-pay | Admitting: Physician Assistant

## 2013-11-07 ENCOUNTER — Ambulatory Visit (INDEPENDENT_AMBULATORY_CARE_PROVIDER_SITE_OTHER): Payer: Medicare Other | Admitting: General Practice

## 2013-11-07 DIAGNOSIS — I82509 Chronic embolism and thrombosis of unspecified deep veins of unspecified lower extremity: Secondary | ICD-10-CM

## 2013-11-07 DIAGNOSIS — I82409 Acute embolism and thrombosis of unspecified deep veins of unspecified lower extremity: Secondary | ICD-10-CM

## 2013-11-07 DIAGNOSIS — Z86718 Personal history of other venous thrombosis and embolism: Secondary | ICD-10-CM

## 2013-11-07 DIAGNOSIS — Z5181 Encounter for therapeutic drug level monitoring: Secondary | ICD-10-CM

## 2013-11-07 DIAGNOSIS — Z7901 Long term (current) use of anticoagulants: Secondary | ICD-10-CM

## 2013-11-07 DIAGNOSIS — I82402 Acute embolism and thrombosis of unspecified deep veins of left lower extremity: Secondary | ICD-10-CM

## 2013-11-07 LAB — POCT INR: INR: 1.7

## 2013-11-07 NOTE — Progress Notes (Signed)
Pre visit review using our clinic review tool, if applicable. No additional management support is needed unless otherwise documented below in the visit note. 

## 2013-11-13 ENCOUNTER — Ambulatory Visit (INDEPENDENT_AMBULATORY_CARE_PROVIDER_SITE_OTHER): Payer: Medicare Other | Admitting: Internal Medicine

## 2013-11-13 ENCOUNTER — Encounter: Payer: Self-pay | Admitting: Internal Medicine

## 2013-11-13 VITALS — BP 124/86 | HR 61 | Ht 64.0 in | Wt 222.0 lb

## 2013-11-13 DIAGNOSIS — I509 Heart failure, unspecified: Secondary | ICD-10-CM

## 2013-11-13 DIAGNOSIS — R0609 Other forms of dyspnea: Secondary | ICD-10-CM

## 2013-11-13 DIAGNOSIS — R0989 Other specified symptoms and signs involving the circulatory and respiratory systems: Secondary | ICD-10-CM

## 2013-11-13 DIAGNOSIS — I5023 Acute on chronic systolic (congestive) heart failure: Secondary | ICD-10-CM

## 2013-11-13 DIAGNOSIS — I6529 Occlusion and stenosis of unspecified carotid artery: Secondary | ICD-10-CM

## 2013-11-13 NOTE — Patient Instructions (Signed)
If shortness of breath bothers you more, try going back to Advair 1 puff, twice daily  Please call as needed

## 2013-11-13 NOTE — Progress Notes (Signed)
Subjective:    [name], female    DOB: Apr 22, 1936, 78 y.o.   MRN: [id number]  HPI 10/17/10- 89 yoF former smoker followed for OSA and COPD, complicated by DM, hx breast cancer, hx DVT. Here with husband. Last here Oct 17, 2009- note reviewed.  She denies any significant breathing issues since last here. Continues to use CPAP at 10 all night every night. Never recurrence of DVT left leg.  Noted minor cough when first lying down- they are not concerned.   10/22/11- 89 yoF former smoker followed for OSA and COPD, complicated by DM, hx breast cancer, hx DVT.... Here with husband. Denies any SOB, wheezing, cough, or congestion. Wears CPAP every night for 5-6 hours approx. Stable dyspnea on exertion with hills and stairs. Hospitalized from February 18 through 26 with pulmonary edema, history AFib, chronic Coumadin, history of DVT. Ejection fraction 25-30%.  01/29/12- 89 yoF former smoker followed for OSA and COPD, complicated by DM, hx breast cancer, hx DVT.... Here with husband. ACUTE VISIT: Increased SOB and worse at night; get raspy breathing prior to SOB starting up. This episode made her feel like her stomach was going to pop. Hospital 8/20 through 01/13/2012-right carotid endarterectomy/Dr. Bridgett Larsson. Occasional episodes all summer described as onset of shortness of breath in the evening after dinner when she is reading or watching TV or it she starts clearing her throat and feels bloated in the abdomen which makes her more short of breath. Worse if lying down. Denies fever, purulent discharge, palpitation or chest pain. 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 CPAP all night every night. She has been mis- using Advair, saving it for her episodes of dyspnea, so she uses it once at night with 2 or 3 activations. I reeducated her on use as a maintenance inhaler.  03/15/12- 89 yoF former smoker followed for OSA and  COPD, complicated by DM, hx breast cancer, hx DVT.... Here with husband.  Pt states that SOB has improved since last OV. No complaints She feels well and is walking more. Recognizes fluid retention causes shortness of breath. Her diuretic therapy helps. Pleased with CPAP 10/Advanced used all night every night. CXR 02/02/12-reviewed with her IMPRESSION:  Cardiomegaly. COPD/chronic changes. No active disease.  Original Report Authenticated By: Raelyn Number, M.D.   09/13/12- 89 yoF former smoker followed for OSA and COPD, complicated by DM, hx breast cancer, hx DVT.... Here with husband. FOLLOWS FOR:SOB at times and feelings of not being able to breathe-denies this happens with activity; states she goes to bed reading and about 30 minutes later she cant breathe and feels congestion. CPAP 10/ Advanced Is positional dyspnea happens about one time a month and is relieved by sitting up and waiting for it to go away.intends to start before she puts her CPAP on. She has been using either Dulera or Advair intermittently as rescue inhalers which we discussed. She emphasizes that she gets acutely short of breath and will grab for anything. She wasn't satisfied to discuss the difference between maintenance and rescue inhalers. She is being worked up for anemia. Glaucoma is treated with Timoptic which may cause bronchospasm. History of diastolic heart failure followed by cardiology. CXR 05/03/12 IMPRESSION:  Cardiac enlargement with pulmonary vascular congestion and  interstitial edema similar to previous study.  Original Report Authenticated By: Lucienne Capers, M.D.  11/14/12-  89 yoF former smoker followed for OSA and COPD, complicated by  DM, hx breast cancer, hx DVT, hx anemia.... Here with husband. FOLLWS FOR: review PFT with patient; Occasional SOB at times. Continues CPAP 10/ [employer] Reports little cough. BNP 09/13/12- 458, down from 3006 PFT- 11/14/2012-normal spirometry flows without response to  bronchodilator, normal lung volumes, diffusion moderately reduced/55% predicted CXR 09/13/12 IMPRESSION:  Enlargement of cardiac silhouette with pulmonary vascular  congestion.  Minimal chronic bronchitic changes with left basilar atelectasis.  Original Report Authenticated By: Lavonia Dana, M.D.  05/15/13- 78 yoF former smoker followed for OSA and COPD, complicated by DM, hx breast cancer, hx DVT, hx anemia.... Here with husband. FOLLOWS FOR: SOB and wheezing at times(happens all of a sudden); Has had to use Advair  If upset or nervous her breathing gets raspy and short of breath. She sits quietly and takes chlorazepate her breathing becomes comfortable again. Uses Proventil and Advair interchangeably as rescue inhalers-educated on this. Occasional prednisone, about 3 days a month, for arthralgias CPAP 10/Advanced good compliance and control  11/13/13- 89 yoF former smoker followed for OSA and COPD, complicated by DM, hx breast cancer, hx DVT, hx anemia.... Here with husband. FOLLOWS FOR:  Breathing is unchanged since last OV, has good and bad days with weather.  Wearing CPAP 10/ Advanced 8 hours per night She feels CPAP is doing well. Sleeps well. Occasional shortness of breath mostly with exertion. Prednisone 3 days per week for arthritis  ROS-see HPI Constitutional:   No-   weight loss, night sweats, fevers, chills, fatigue, lassitude. HEENT:   No-  headaches, difficulty swallowing, tooth/dental problems, sore throat,       No-  sneezing, itching, ear ache, nasal congestion, post nasal drip,  CV:  No-   chest pain, +orthopnea, +PND, swelling in lower extremities, anasarca, dizziness, palpitations Resp: +shortness of breath with exertion or at rest.              No-   productive cough,  No non-productive cough,  No- coughing up of blood.              No-   change in color of mucus.  No- wheezing.   Skin: No-   rash or lesions. GI:  No-   heartburn, indigestion, abdominal pain, nausea,  vomiting,  GU:  MS:  + joint pain or swelling.   Neuro-     nothing unusual Psych:  No- change in mood or affect. No depression or anxiety.  No memory loss.  OBJ- Physical Exam General- Alert, Oriented, Affect-appropriate, Distress- none acute, overweight Skin- rash-none, lesions- none, excoriation- none Lymphadenopathy- none Head- atraumatic            Eyes- Gross vision intact, PERRLA, conjunctivae and secretions clear            Ears- Hearing, canals-normal            Nose- Clear, no-Septal dev, mucus, polyps, erosion, perforation             Throat- Mallampati II , mucosa clear , drainage- none, tonsils- atrophic Neck- flexible , trachea midline, no stridor , thyroid nl, + R carotid scar Chest - symmetrical excursion , unlabored           Heart/CV- RRR , +1/1 systolic Aortic murmur , no gallop  , no rub, nl s1 s2                           - JVD- none , edema+trace, stasis changes- none, varices-  none           Lung- clear to P&A, wheeze- none, cough- none , dullness-none, rub- none           Chest wall-  Abd-  Br/ Gen/ Rectal- Not done, not indicated Extrem- cyanosis- none, clubbing, none, atrophy- none, strength- nl Neuro- grossly intact to observation

## 2013-11-17 ENCOUNTER — Encounter: Payer: Self-pay | Admitting: Internal Medicine

## 2013-11-17 ENCOUNTER — Ambulatory Visit (INDEPENDENT_AMBULATORY_CARE_PROVIDER_SITE_OTHER): Payer: Medicare Other | Admitting: Internal Medicine

## 2013-11-17 ENCOUNTER — Other Ambulatory Visit (INDEPENDENT_AMBULATORY_CARE_PROVIDER_SITE_OTHER): Payer: Medicare Other

## 2013-11-17 VITALS — BP 130/80 | HR 68 | Temp 97.8°F | Resp 16 | Wt 225.0 lb

## 2013-11-17 DIAGNOSIS — M545 Low back pain, unspecified: Secondary | ICD-10-CM

## 2013-11-17 DIAGNOSIS — I509 Heart failure, unspecified: Secondary | ICD-10-CM

## 2013-11-17 DIAGNOSIS — I6529 Occlusion and stenosis of unspecified carotid artery: Secondary | ICD-10-CM

## 2013-11-17 DIAGNOSIS — N183 Chronic kidney disease, stage 3 unspecified: Secondary | ICD-10-CM

## 2013-11-17 DIAGNOSIS — E119 Type 2 diabetes mellitus without complications: Secondary | ICD-10-CM

## 2013-11-17 DIAGNOSIS — E669 Obesity, unspecified: Secondary | ICD-10-CM

## 2013-11-17 DIAGNOSIS — I480 Paroxysmal atrial fibrillation: Secondary | ICD-10-CM

## 2013-11-17 DIAGNOSIS — E039 Hypothyroidism, unspecified: Secondary | ICD-10-CM

## 2013-11-17 DIAGNOSIS — R739 Hyperglycemia, unspecified: Secondary | ICD-10-CM

## 2013-11-17 DIAGNOSIS — I5023 Acute on chronic systolic (congestive) heart failure: Secondary | ICD-10-CM

## 2013-11-17 DIAGNOSIS — R7309 Other abnormal glucose: Secondary | ICD-10-CM

## 2013-11-17 DIAGNOSIS — I428 Other cardiomyopathies: Secondary | ICD-10-CM

## 2013-11-17 DIAGNOSIS — I4891 Unspecified atrial fibrillation: Secondary | ICD-10-CM

## 2013-11-17 DIAGNOSIS — M353 Polymyalgia rheumatica: Secondary | ICD-10-CM

## 2013-11-17 DIAGNOSIS — E785 Hyperlipidemia, unspecified: Secondary | ICD-10-CM

## 2013-11-17 LAB — CBC WITH DIFFERENTIAL/PLATELET
BASOS PCT: 0.5 % (ref 0.0–3.0)
Basophils Absolute: 0 10*3/uL (ref 0.0–0.1)
Eosinophils Absolute: 0.5 10*3/uL (ref 0.0–0.7)
Eosinophils Relative: 5.2 % — ABNORMAL HIGH (ref 0.0–5.0)
HCT: 35.8 % — ABNORMAL LOW (ref 36.0–46.0)
Hemoglobin: 12.1 g/dL (ref 12.0–15.0)
Lymphs Abs: 0.6 10*3/uL — ABNORMAL LOW (ref 0.7–4.0)
MCHC: 33.7 g/dL (ref 30.0–36.0)
MCV: 94.7 fl (ref 78.0–100.0)
MONOS PCT: 6.6 % (ref 3.0–12.0)
Monocytes Absolute: 0.6 10*3/uL (ref 0.1–1.0)
Neutro Abs: 7.2 10*3/uL (ref 1.4–7.7)
Neutrophils Relative %: 80.6 % — ABNORMAL HIGH (ref 43.0–77.0)
Platelets: 304 10*3/uL (ref 150.0–400.0)
RBC: 3.78 Mil/uL — AB (ref 3.87–5.11)
RDW: 15.3 % (ref 11.5–15.5)
WBC: 8.9 10*3/uL (ref 4.0–10.5)

## 2013-11-17 LAB — HEMOGLOBIN A1C: HEMOGLOBIN A1C: 5.5 % (ref 4.6–6.5)

## 2013-11-17 LAB — BASIC METABOLIC PANEL
BUN: 29 mg/dL — ABNORMAL HIGH (ref 6–23)
CALCIUM: 9 mg/dL (ref 8.4–10.5)
CO2: 22 mEq/L (ref 19–32)
CREATININE: 1.7 mg/dL — AB (ref 0.4–1.2)
Chloride: 109 mEq/L (ref 96–112)
GFR: 30.46 mL/min — AB (ref >60.00)
Glucose, Bld: 111 mg/dL — ABNORMAL HIGH (ref 70–99)
Potassium: 4.7 mEq/L (ref 3.5–5.1)
SODIUM: 138 meq/L (ref 135–145)

## 2013-11-17 LAB — LIPID PANEL
Cholesterol: 240 mg/dL — ABNORMAL HIGH (ref 0–200)
HDL: 32.3 mg/dL — ABNORMAL LOW (ref >39.00)
LDL CALC: 154 mg/dL — AB (ref 0–99)
NonHDL: 207.7
TRIGLYCERIDES: 269 mg/dL — AB (ref 0.0–149.0)
Total CHOL/HDL Ratio: 7
VLDL: 53.8 mg/dL — AB (ref 0.0–40.0)

## 2013-11-17 LAB — HEPATIC FUNCTION PANEL
ALT: 16 U/L (ref 0–35)
AST: 19 U/L (ref 0–37)
Albumin: 3.9 g/dL (ref 3.5–5.2)
Alkaline Phosphatase: 59 U/L (ref 39–117)
BILIRUBIN TOTAL: 0.6 mg/dL (ref 0.2–1.2)
Bilirubin, Direct: 0.1 mg/dL (ref 0.0–0.3)
Total Protein: 7.1 g/dL (ref 6.0–8.3)

## 2013-11-17 LAB — TESTOSTERONE: Testosterone: 23.53 ng/dL (ref 15.00–40.00)

## 2013-11-17 LAB — URIC ACID: Uric Acid, Serum: 8.9 mg/dL — ABNORMAL HIGH (ref 2.4–7.0)

## 2013-11-17 LAB — TSH: TSH: 3.06 u[IU]/mL (ref 0.35–4.50)

## 2013-11-17 MED ORDER — TRIAMCINOLONE ACETONIDE 0.1 % EX CREA: 1.0000 "application " | TOPICAL_CREAM | Freq: Two times a day (BID) | CUTANEOUS | Status: DC

## 2013-11-17 NOTE — Assessment & Plan Note (Signed)
Continue with current prescription therapy as reflected on the Med list.  

## 2013-11-17 NOTE — Progress Notes (Signed)
Pre visit review using our clinic review tool, if applicable. No additional management support is needed unless otherwise documented below in the visit note. 

## 2013-11-17 NOTE — Assessment & Plan Note (Signed)
Wt Readings from Last 3 Encounters:  11/17/13 225 lb (102.059 kg)  11/13/13 222 lb (100.699 kg)  08/24/13 225 lb 6.4 oz (102.241 kg)

## 2013-11-19 ENCOUNTER — Other Ambulatory Visit (HOSPITAL_COMMUNITY): Payer: Self-pay | Admitting: Internal Medicine

## 2013-11-21 ENCOUNTER — Other Ambulatory Visit: Payer: Self-pay | Admitting: *Deleted

## 2013-11-21 MED ORDER — BUPROPION HCL ER (SR) 150 MG PO TB12: 150.0000 mg | ORAL_TABLET | Freq: Two times a day (BID) | ORAL | Status: DC

## 2013-11-21 MED ORDER — CARVEDILOL 12.5 MG PO TABS: ORAL_TABLET | ORAL | Status: DC

## 2013-11-21 MED ORDER — LEVOTHYROXINE SODIUM 137 MCG PO TABS: 137.0000 ug | ORAL_TABLET | Freq: Every day | ORAL | Status: DC

## 2013-11-28 ENCOUNTER — Other Ambulatory Visit: Payer: Self-pay | Admitting: *Deleted

## 2013-11-28 MED ORDER — CARVEDILOL 12.5 MG PO TABS: ORAL_TABLET | ORAL | Status: DC

## 2013-11-28 MED ORDER — BUPROPION HCL ER (SR) 150 MG PO TB12
150.0000 mg | ORAL_TABLET | Freq: Two times a day (BID) | ORAL | Status: DC
Start: 2013-11-28 — End: 2014-05-15

## 2013-11-28 MED ORDER — LEVOTHYROXINE SODIUM 137 MCG PO TABS: 137.0000 ug | ORAL_TABLET | Freq: Every day | ORAL | Status: DC

## 2013-12-06 ENCOUNTER — Ambulatory Visit (INDEPENDENT_AMBULATORY_CARE_PROVIDER_SITE_OTHER): Payer: Medicare Other | Admitting: General Practice

## 2013-12-06 DIAGNOSIS — I82409 Acute embolism and thrombosis of unspecified deep veins of unspecified lower extremity: Secondary | ICD-10-CM

## 2013-12-06 DIAGNOSIS — Z7901 Long term (current) use of anticoagulants: Secondary | ICD-10-CM

## 2013-12-06 DIAGNOSIS — Z86718 Personal history of other venous thrombosis and embolism: Secondary | ICD-10-CM

## 2013-12-06 DIAGNOSIS — I82509 Chronic embolism and thrombosis of unspecified deep veins of unspecified lower extremity: Secondary | ICD-10-CM

## 2013-12-06 DIAGNOSIS — Z5181 Encounter for therapeutic drug level monitoring: Secondary | ICD-10-CM

## 2013-12-06 DIAGNOSIS — I82402 Acute embolism and thrombosis of unspecified deep veins of left lower extremity: Secondary | ICD-10-CM

## 2013-12-06 LAB — POCT INR: INR: 2.2

## 2013-12-06 NOTE — Progress Notes (Signed)
Pre visit review using our clinic review tool, if applicable. No additional management support is needed unless otherwise documented below in the visit note. 

## 2013-12-28 ENCOUNTER — Other Ambulatory Visit (HOSPITAL_BASED_OUTPATIENT_CLINIC_OR_DEPARTMENT_OTHER): Payer: Medicare Other

## 2013-12-28 ENCOUNTER — Telehealth: Payer: Self-pay | Admitting: Oncology

## 2013-12-28 ENCOUNTER — Other Ambulatory Visit: Payer: Self-pay

## 2013-12-28 ENCOUNTER — Ambulatory Visit (HOSPITAL_BASED_OUTPATIENT_CLINIC_OR_DEPARTMENT_OTHER): Payer: Medicare Other | Admitting: Oncology

## 2013-12-28 VITALS — BP 142/70 | HR 57 | Temp 97.9°F | Resp 20 | Ht 64.0 in | Wt 224.0 lb

## 2013-12-28 DIAGNOSIS — C50919 Malignant neoplasm of unspecified site of unspecified female breast: Secondary | ICD-10-CM

## 2013-12-28 DIAGNOSIS — Z86718 Personal history of other venous thrombosis and embolism: Secondary | ICD-10-CM

## 2013-12-28 DIAGNOSIS — D649 Anemia, unspecified: Secondary | ICD-10-CM

## 2013-12-28 LAB — CBC WITH DIFFERENTIAL/PLATELET
BASO%: 0.8 % (ref 0.0–2.0)
BASOS ABS: 0.1 10*3/uL (ref 0.0–0.1)
EOS%: 6.8 % (ref 0.0–7.0)
Eosinophils Absolute: 0.6 10*3/uL — ABNORMAL HIGH (ref 0.0–0.5)
HEMATOCRIT: 32.7 % — AB (ref 34.8–46.6)
HEMOGLOBIN: 10.7 g/dL — AB (ref 11.6–15.9)
LYMPH%: 7.1 % — AB (ref 14.0–49.7)
MCH: 30.4 pg (ref 25.1–34.0)
MCHC: 32.7 g/dL (ref 31.5–36.0)
MCV: 92.9 fL (ref 79.5–101.0)
MONO#: 0.6 10*3/uL (ref 0.1–0.9)
MONO%: 6.6 % (ref 0.0–14.0)
NEUT%: 78.7 % — ABNORMAL HIGH (ref 38.4–76.8)
NEUTROS ABS: 6.5 10*3/uL (ref 1.5–6.5)
NRBC: 0 % (ref 0–0)
Platelets: 301 10*3/uL (ref 145–400)
RBC: 3.52 10*6/uL — AB (ref 3.70–5.45)
RDW: 13.8 % (ref 11.2–14.5)
WBC: 8.3 10*3/uL (ref 3.9–10.3)
lymph#: 0.6 10*3/uL — ABNORMAL LOW (ref 0.9–3.3)

## 2013-12-28 MED ORDER — TORSEMIDE 10 MG PO TABS: ORAL_TABLET | ORAL | Status: DC

## 2013-12-28 NOTE — Telephone Encounter (Signed)
gv adn printed appt sched and avs for pt for NOV and Feb 2015 and 2016

## 2013-12-28 NOTE — Progress Notes (Signed)
  [place] OFFICE PROGRESS NOTE   Diagnosis: Breast cancer, anemia  INTERVAL HISTORY:   She returns as scheduled. She continues Arimidex. No specific complaint. No bleeding.  Objective:  Vital signs in last 24 hours:  Blood pressure 142/70, pulse 57, temperature 97.9 F (36.6 C), temperature source Oral, resp. rate 20, height _0  (1.626 m), weight 224 lb (101.606 kg), SpO2 100.00%.    HEENT: Neck without mass Lymphatics: No cervical, supraclavicular, or axillary nodes Resp: Lungs clear bilaterally Cardio: Regular rate and rhythm GI: No hepatomegaly Vascular: No leg edema Breasts: Status post right mastectomy. No evidence for chest wall tumor recurrence. Left breast without mass.      Lab Results:  Lab Results  Component Value Date   WBC 8.3 12/28/2013   HGB 10.7* 12/28/2013   HCT 32.7* 12/28/2013   MCV 92.9 12/28/2013   PLT 301 12/28/2013   NEUTROABS 6.5 12/28/2013    Medications: I have reviewed the patient's current medications.  Assessment/Plan: 1. Extensive left lower extremity deep vein thrombosis. Maintained on anticoagulation therapy since she was diagnosed with a deep venous thrombosis, 01/31/2009.  a. She was treated with Lovenox followed by heparin anticoagulation while hospitalized, and she was then maintained on Lovenox prior to surgery on 03/02/2009.  b. Now maintained on therapeutic Coumadin anticoagulation.  2. Diagnosed with synchronous T1, ER positive, PR positive, and HER-2 negative right-sided breast cancer on 01/09/2009. a. Status post a right mastectomy and sentinel lymph node biopsy on 03/22/2009 with the pathology confirming two foci of invasive carcinoma measuring 0.6 and 0.4 cm. The invasive tumor was grade 1 with associated low-grade ductal carcinoma in situ. No lymphovascular invasion was identified, and three right-sided sentinel lymph nodes were negative for metastatic carcinoma.  b. Initiation of adjuvant Arimidex on  04/05/2009. 3. Chronic obstructive pulmonary disease. 4. History of a cerebrovascular accident. 5. Gout. 6. Obstructive sleep apnea. 7. History of renal insufficiency. 8. Anemia-severe microcytic anemia noted on 07/20/2012. Question secondary to iron deficiency or renal insufficiency. Stool Hemoccult positive x3 on 06/21/2013. 9. Admission with "flash "pulmonary edema in February of 2013. 10. Left carotid endarterectomy June 2013, right carotid endarterectomy August 2013   Disposition:  She remains in clinical remission from breast cancer. She will complete 5 years of Arimidex at the end of October 2015. She will then discontinue Arimidex. The hemoglobin has not changed significantly over the past 6 months. She does not wish to undergo another colonoscopy. The anemia could be related to bleeding while on Coumadin, or renal insufficiency. She will continue iron therapy. She will return for a CBC in 3 months.  Ms. Meany will return for an office visit in 6 months.  Betsy Coder, MD  12/28/2013  3:48 PM

## 2014-01-10 ENCOUNTER — Ambulatory Visit (INDEPENDENT_AMBULATORY_CARE_PROVIDER_SITE_OTHER): Payer: Medicare Other | Admitting: *Deleted

## 2014-01-10 DIAGNOSIS — I82402 Acute embolism and thrombosis of unspecified deep veins of left lower extremity: Secondary | ICD-10-CM

## 2014-01-10 DIAGNOSIS — Z7901 Long term (current) use of anticoagulants: Secondary | ICD-10-CM

## 2014-01-10 DIAGNOSIS — I82409 Acute embolism and thrombosis of unspecified deep veins of unspecified lower extremity: Secondary | ICD-10-CM

## 2014-01-10 DIAGNOSIS — I82509 Chronic embolism and thrombosis of unspecified deep veins of unspecified lower extremity: Secondary | ICD-10-CM

## 2014-01-10 DIAGNOSIS — Z86718 Personal history of other venous thrombosis and embolism: Secondary | ICD-10-CM

## 2014-01-10 DIAGNOSIS — Z5181 Encounter for therapeutic drug level monitoring: Secondary | ICD-10-CM

## 2014-01-10 LAB — POCT INR: INR: 2.5

## 2014-01-21 NOTE — Assessment & Plan Note (Signed)
Managed by cardiology 

## 2014-01-21 NOTE — Assessment & Plan Note (Signed)
Multifactorial dyspnea on exertion reflecting heart disease, pulmonary function. She insists she is doing okay and needs nothing else. Plan-try Advair twice daily and watch oxygen saturations

## 2014-02-07 ENCOUNTER — Ambulatory Visit (INDEPENDENT_AMBULATORY_CARE_PROVIDER_SITE_OTHER): Payer: Medicare Other | Admitting: *Deleted

## 2014-02-07 DIAGNOSIS — I82509 Chronic embolism and thrombosis of unspecified deep veins of unspecified lower extremity: Secondary | ICD-10-CM

## 2014-02-07 DIAGNOSIS — I82402 Acute embolism and thrombosis of unspecified deep veins of left lower extremity: Secondary | ICD-10-CM

## 2014-02-07 DIAGNOSIS — Z86718 Personal history of other venous thrombosis and embolism: Secondary | ICD-10-CM

## 2014-02-07 DIAGNOSIS — Z7901 Long term (current) use of anticoagulants: Secondary | ICD-10-CM

## 2014-02-07 DIAGNOSIS — Z5181 Encounter for therapeutic drug level monitoring: Secondary | ICD-10-CM

## 2014-02-07 DIAGNOSIS — I82409 Acute embolism and thrombosis of unspecified deep veins of unspecified lower extremity: Secondary | ICD-10-CM

## 2014-02-07 LAB — POCT INR: INR: 2.6

## 2014-02-14 ENCOUNTER — Other Ambulatory Visit: Payer: Self-pay | Admitting: Internal Medicine

## 2014-02-20 ENCOUNTER — Ambulatory Visit (INDEPENDENT_AMBULATORY_CARE_PROVIDER_SITE_OTHER): Payer: Medicare Other | Admitting: Cardiovascular Disease

## 2014-02-20 ENCOUNTER — Encounter: Payer: Self-pay | Admitting: Cardiovascular Disease

## 2014-02-20 VITALS — BP 138/58 | HR 56 | Ht 64.0 in | Wt 222.8 lb

## 2014-02-20 DIAGNOSIS — I4891 Unspecified atrial fibrillation: Secondary | ICD-10-CM

## 2014-02-20 DIAGNOSIS — E1159 Type 2 diabetes mellitus with other circulatory complications: Secondary | ICD-10-CM

## 2014-02-20 DIAGNOSIS — R011 Cardiac murmur, unspecified: Secondary | ICD-10-CM | POA: Insufficient documentation

## 2014-02-20 DIAGNOSIS — E1169 Type 2 diabetes mellitus with other specified complication: Secondary | ICD-10-CM

## 2014-02-20 DIAGNOSIS — I480 Paroxysmal atrial fibrillation: Secondary | ICD-10-CM

## 2014-02-20 DIAGNOSIS — I6529 Occlusion and stenosis of unspecified carotid artery: Secondary | ICD-10-CM

## 2014-02-20 DIAGNOSIS — Z9989 Dependence on other enabling machines and devices: Secondary | ICD-10-CM

## 2014-02-20 DIAGNOSIS — I428 Other cardiomyopathies: Secondary | ICD-10-CM

## 2014-02-20 DIAGNOSIS — I359 Nonrheumatic aortic valve disorder, unspecified: Secondary | ICD-10-CM

## 2014-02-20 DIAGNOSIS — I350 Nonrheumatic aortic (valve) stenosis: Secondary | ICD-10-CM

## 2014-02-20 DIAGNOSIS — I10 Essential (primary) hypertension: Secondary | ICD-10-CM

## 2014-02-20 DIAGNOSIS — E119 Type 2 diabetes mellitus without complications: Secondary | ICD-10-CM

## 2014-02-20 DIAGNOSIS — G4733 Obstructive sleep apnea (adult) (pediatric): Secondary | ICD-10-CM

## 2014-02-20 NOTE — Assessment & Plan Note (Signed)
Well controlled.  Continue current medications and low sodium Dash type diet.    

## 2014-02-20 NOTE — Assessment & Plan Note (Signed)
Discussed low carb diet.  Target hemoglobin A1c is 6.5 or less.  Continue current medications.  

## 2014-02-20 NOTE — Progress Notes (Signed)
[name], female   DOB: 02/14/1936, 78 y.o.   MRN: [id number] Ms Hemminger is a 78 year old female with a history of heart failure NICM, PAF, OSA, HTN, prior DVT, renal insufficiency . Carotid duplex on 11/10/11 showed bilateral 80-99% ICA stenosis and she is post bilateral CEA;s She is normally seen by Dr Martinique and saw Dr Harrington Challenger in consultation at the hospital 6/26 for post op hypertension. She had a heart cath 07/13/11  She developed acute shortness of breath and came to the hospital where she was in flash pulmonary edema. She was on them overloaded and admitted for further evaluation and treatment.  She was diuresed with Demadex and lost 6 pounds during her hospital stay. As her volume status improved her shortness of breath improved. She has a history of chronic kidney disease with a creatinine between 1.4 and 2.0. Her BUN and creatinine worsened with diuresis but her labs were reviewed and it was felt her creatinine would need to be in this range for her volume status to be stable. She admitted to dietary indiscretion and noncompliance with medications. She was encouraged to stick to a 2 g sodium, heart healthy diet. She was also encouraged to take her diuretic on a daily basis to weigh herself daily to track her weight.  There was concern at that inadequately treated hypertension was a cause for her flash pulmonary edema. She had hydralazine and nitrates added to her medication regimen and tolerated these medications. Because of her renal insufficiency, she is not on an ACE inhibitor or an ARB. She is on a beta blocker. She has a history of obstructive sleep apnea and CPAP was ordered for her but she refused while in the hospital. Her blood sugars were monitored while she was in the hospital and they were stable. Her INR was also monitored and her Coumadin level is therapeutic.  Echo 2/13 reviewed  Study Conclusions  - Left ventricle: Systolic function was severely reduced. The  estimated ejection fraction was in the range of 25% to 30%. Features are consistent with a pseudonormal left ventricular filling pattern, with concomitant abnormal relaxation and increased filling pressure (grade 2 diastolic dysfunction). Doppler parameters are consistent with elevated mean left atrial filling pressure. - Regional wall motion abnormality: Akinesis of the basal-mid anteroseptal myocardium; hypokinesis of the entire anterior, mid inferolateral, basal-mid anterolateral, and apical lateral myocardium. - Aortic valve: Mild regurgitation. - Mitral valve: Mild regurgitation. - Left atrium: The atrium was moderately dilated. - Right atrium: The atrium was mildly dilated. - Pulmonary arteries: PA peak pressure: 41mm Hg (S). Impressions:  - The right ventricular systolic pressure was increased consistent with mild pulmonary hypertension.  Echo 9/14 looked much better EF 55-60% But now with some degree of AS Mean gradient 22 and peak 31 mmHG   She is followed by Dr Penni Homans for anemia and breast cancer on arimidex Sees Dr Annamaria Boots for COPD OSA and previous smoker   ROS: Denies fever, malais, weight loss, blurry vision, decreased visual acuity, cough, sputum, SOB, hemoptysis, pleuritic pain, palpitaitons, heartburn, abdominal pain, melena, lower extremity edema, claudication, or rash.  All other systems reviewed and negative  General: Affect appropriate Overweight white female  HEENT: normal Neck supple with no adenopathy JVP normal no bruits no thyromegaly Lungs clear with no wheezing and good diaphragmatic motion Heart:  S1/S2 AS  murmur, no rub, gallop or click PMI normal Abdomen: benighn, BS positve, no tenderness, no AAA no bruit.  No HSM or HJR Distal  pulses intact with no bruits Trace bilateral  edema Neuro non-focal Skin warm and dry No muscular weakness   Current Outpatient Prescriptions  Medication Sig Dispense Refill  . ADVAIR DISKUS 100-50 MCG/DOSE AEPB  INHALE 1 PUFF INTO THE LUNGS EVERY 12 HOURS  60 each  5  . albuterol (PROVENTIL HFA;VENTOLIN HFA) 108 (90 BASE) MCG/ACT inhaler Inhale 2 puffs into the lungs every 6 (six) hours as needed for wheezing or shortness of breath.  1 Inhaler  6  . allopurinol (ZYLOPRIM) 100 MG tablet TAKE 1 TABLET three times a week      . anastrozole (ARIMIDEX) 1 MG tablet TAKE 1 TABLET BY MOUTH DAILY  90 tablet  1  . brimonidine (ALPHAGAN) 0.15 % ophthalmic solution Place 1 drop into both eyes daily.       [name] buPROPion (WELLBUTRIN SR) 150 MG 12 hr tablet Take 1 tablet (150 mg total) by mouth 2 (two) times daily.  180 tablet  3  . calcium carbonate (CALCIUM 500) 1250 MG tablet Take 1 tablet by mouth. Three days a week      . carvedilol (COREG) 12.5 MG tablet TAKING 2 TABLETS IN THE MORNING AND 1 AT NIGHT  270 tablet  3  . clorazepate (TRANXENE) 7.5 MG tablet TAKE 1 TABLET BY MOUTH TWICE A DAY AS NEEDED FOR ANXIETY  60 tablet  5  . ferrous sulfate 325 (65 FE) MG EC tablet Take 325 mg by mouth daily.      . hydrALAZINE (APRESOLINE) 25 MG tablet TAKE 1 TABLET (25 MG TOTAL) BY MOUTH 3 (THREE) TIMES DAILY.  90 tablet  3  . isosorbide mononitrate (IMDUR) 60 MG 24 hr tablet TAKE 1 TABLET (60 MG TOTAL) BY MOUTH DAILY.  30 tablet  3  . levothyroxine (SYNTHROID, LEVOTHROID) 137 MCG tablet Take 1 tablet (137 mcg total) by mouth daily after breakfast.  90 tablet  3  . omeprazole (PRILOSEC) 40 MG capsule Take 1 capsule (40 mg total) by mouth daily.  30 capsule  11  . prednisoLONE acetate (PRED FORTE) 1 % ophthalmic suspension Place 1 drop into both eyes daily as needed. For eye pain      . predniSONE (DELTASONE) 5 MG tablet       . timolol (TIMOPTIC) 0.5 % ophthalmic solution Place 1 drop into both eyes 2 (two) times daily. Twice daily      . torsemide (DEMADEX) 10 MG tablet TAKE 1 TABLET (10 MG TOTAL) BY MOUTH DAILY.  30 tablet  6  . traMADol (ULTRAM) 50 MG tablet Take 1-2 tablets (50-100 mg total) by mouth 2 (two) times daily as  needed for moderate pain or severe pain.  120 tablet  3  . triamcinolone cream (KENALOG) 0.1 % Apply 1 application topically 2 (two) times daily.  90 g  3  . vitamin D, CHOLECALCIFEROL, 400 UNITS tablet Take 400 Units by mouth. Three days a week      . warfarin (COUMADIN) 3 MG tablet TAKE 1 TABLET (3 MG TOTAL) BY MOUTH DAILY OR AS DIRECTED BY PHYSICIAN.  30 tablet  2   No current facility-administered medications for this visit.    Allergies  Amlodipine; Atorvastatin; Colesevelam; Lasix; Statins; and Tape  Electrocardiogram: 2013  SR possible old IMI rate60  Today SB rate 56 LVH LAD   Assessment and Plan

## 2014-02-20 NOTE — Patient Instructions (Signed)

## 2014-02-20 NOTE — Assessment & Plan Note (Signed)
Wearing CPAP f/u Dr Janeth Rase

## 2014-02-20 NOTE — Assessment & Plan Note (Signed)
Maint NSR history of stroke continue coumadin INR Rx check q 4 weeks

## 2014-02-20 NOTE — Assessment & Plan Note (Signed)
EF normalized by echo last year improved continue current meds

## 2014-02-20 NOTE — Assessment & Plan Note (Signed)
F/U echo history of mild AS

## 2014-03-01 ENCOUNTER — Ambulatory Visit (HOSPITAL_COMMUNITY): Payer: Medicare Other | Attending: Cardiology | Admitting: Radiology

## 2014-03-01 DIAGNOSIS — I10 Essential (primary) hypertension: Secondary | ICD-10-CM | POA: Insufficient documentation

## 2014-03-01 DIAGNOSIS — I429 Cardiomyopathy, unspecified: Secondary | ICD-10-CM | POA: Diagnosis not present

## 2014-03-01 DIAGNOSIS — E669 Obesity, unspecified: Secondary | ICD-10-CM | POA: Diagnosis not present

## 2014-03-01 DIAGNOSIS — E119 Type 2 diabetes mellitus without complications: Secondary | ICD-10-CM | POA: Insufficient documentation

## 2014-03-01 DIAGNOSIS — E785 Hyperlipidemia, unspecified: Secondary | ICD-10-CM | POA: Insufficient documentation

## 2014-03-01 DIAGNOSIS — I350 Nonrheumatic aortic (valve) stenosis: Secondary | ICD-10-CM | POA: Diagnosis not present

## 2014-03-01 DIAGNOSIS — Z87891 Personal history of nicotine dependence: Secondary | ICD-10-CM | POA: Diagnosis not present

## 2014-03-01 NOTE — Progress Notes (Signed)
Echocardiogram performed.  

## 2014-03-07 ENCOUNTER — Other Ambulatory Visit: Payer: Self-pay | Admitting: *Deleted

## 2014-03-07 ENCOUNTER — Other Ambulatory Visit: Payer: Self-pay | Admitting: Internal Medicine

## 2014-03-07 NOTE — Telephone Encounter (Signed)
Refill request for warfarin 1 mg sent back to pharmacy with note that Dr. Alain Marion with [facility] is now filling this medication via Coumadin clinic on [facility].

## 2014-03-14 ENCOUNTER — Other Ambulatory Visit: Payer: Self-pay | Admitting: *Deleted

## 2014-03-14 MED ORDER — HYDRALAZINE HCL 25 MG PO TABS: ORAL_TABLET | ORAL | Status: DC

## 2014-03-19 ENCOUNTER — Other Ambulatory Visit: Payer: Self-pay | Admitting: Oncology

## 2014-03-20 ENCOUNTER — Ambulatory Visit (INDEPENDENT_AMBULATORY_CARE_PROVIDER_SITE_OTHER)
Admission: RE | Admit: 2014-03-20 | Discharge: 2014-03-20 | Disposition: A | Discharge status: Home / Self Care | Payer: Medicare Other | Source: Ambulatory Visit | Attending: Internal Medicine | Admitting: Internal Medicine

## 2014-03-20 ENCOUNTER — Ambulatory Visit (INDEPENDENT_AMBULATORY_CARE_PROVIDER_SITE_OTHER): Payer: Medicare Other | Admitting: Internal Medicine

## 2014-03-20 ENCOUNTER — Encounter: Payer: Self-pay | Admitting: Internal Medicine

## 2014-03-20 ENCOUNTER — Other Ambulatory Visit (INDEPENDENT_AMBULATORY_CARE_PROVIDER_SITE_OTHER): Payer: Medicare Other

## 2014-03-20 VITALS — BP 132/78 | HR 57 | Temp 97.4°F | Ht 64.0 in | Wt 226.5 lb

## 2014-03-20 DIAGNOSIS — I6529 Occlusion and stenosis of unspecified carotid artery: Secondary | ICD-10-CM

## 2014-03-20 DIAGNOSIS — M5441 Lumbago with sciatica, right side: Secondary | ICD-10-CM

## 2014-03-20 DIAGNOSIS — M353 Polymyalgia rheumatica: Secondary | ICD-10-CM

## 2014-03-20 DIAGNOSIS — Z23 Encounter for immunization: Secondary | ICD-10-CM

## 2014-03-20 DIAGNOSIS — E785 Hyperlipidemia, unspecified: Secondary | ICD-10-CM

## 2014-03-20 DIAGNOSIS — N183 Chronic kidney disease, stage 3 unspecified: Secondary | ICD-10-CM

## 2014-03-20 DIAGNOSIS — E038 Other specified hypothyroidism: Secondary | ICD-10-CM

## 2014-03-20 DIAGNOSIS — E119 Type 2 diabetes mellitus without complications: Secondary | ICD-10-CM

## 2014-03-20 DIAGNOSIS — E034 Atrophy of thyroid (acquired): Secondary | ICD-10-CM

## 2014-03-20 DIAGNOSIS — I480 Paroxysmal atrial fibrillation: Secondary | ICD-10-CM

## 2014-03-20 LAB — CBC WITH DIFFERENTIAL/PLATELET
Basophils Absolute: 0 10*3/uL (ref 0.0–0.1)
Basophils Relative: 0.6 % (ref 0.0–3.0)
EOS PCT: 5.1 % — AB (ref 0.0–5.0)
Eosinophils Absolute: 0.4 10*3/uL (ref 0.0–0.7)
HCT: 36.3 % (ref 36.0–46.0)
Hemoglobin: 11.7 g/dL — ABNORMAL LOW (ref 12.0–15.0)
Lymphs Abs: 0.5 10*3/uL — ABNORMAL LOW (ref 0.7–4.0)
MCHC: 32.2 g/dL (ref 30.0–36.0)
MCV: 93.1 fl (ref 78.0–100.0)
Monocytes Absolute: 0.5 10*3/uL (ref 0.1–1.0)
Monocytes Relative: 5.8 % (ref 3.0–12.0)
NEUTROS ABS: 6.7 10*3/uL (ref 1.4–7.7)
NEUTROS PCT: 82.4 % — AB (ref 43.0–77.0)
Platelets: 269 10*3/uL (ref 150.0–400.0)
RBC: 3.9 Mil/uL (ref 3.87–5.11)
RDW: 16.1 % — ABNORMAL HIGH (ref 11.5–15.5)
WBC: 8.1 10*3/uL (ref 4.0–10.5)

## 2014-03-20 LAB — URINALYSIS, ROUTINE W REFLEX MICROSCOPIC
Bilirubin Urine: NEGATIVE
HGB URINE DIPSTICK: NEGATIVE
Ketones, ur: NEGATIVE
Leukocytes, UA: NEGATIVE
NITRITE: NEGATIVE
PH: 6 (ref 5.0–8.0)
RBC / HPF: NONE SEEN (ref 0–?)
Specific Gravity, Urine: 1.015 (ref 1.000–1.030)
TOTAL PROTEIN, URINE-UPE24: 100 — AB
Urine Glucose: NEGATIVE
Urobilinogen, UA: 0.2 (ref 0.0–1.0)

## 2014-03-20 LAB — HEPATIC FUNCTION PANEL
ALBUMIN: 3.4 g/dL — AB (ref 3.5–5.2)
ALK PHOS: 67 U/L (ref 39–117)
ALT: 12 U/L (ref 0–35)
AST: 23 U/L (ref 0–37)
BILIRUBIN DIRECT: 0.1 mg/dL (ref 0.0–0.3)
TOTAL PROTEIN: 6.9 g/dL (ref 6.0–8.3)
Total Bilirubin: 0.8 mg/dL (ref 0.2–1.2)

## 2014-03-20 LAB — BASIC METABOLIC PANEL
BUN: 25 mg/dL — ABNORMAL HIGH (ref 6–23)
CALCIUM: 9.2 mg/dL (ref 8.4–10.5)
CO2: 23 meq/L (ref 19–32)
CREATININE: 1.7 mg/dL — AB (ref 0.4–1.2)
Chloride: 110 mEq/L (ref 96–112)
GFR: 30.84 mL/min — ABNORMAL LOW (ref >60.00)
Glucose, Bld: 101 mg/dL — ABNORMAL HIGH (ref 70–99)
Potassium: 5 mEq/L (ref 3.5–5.1)
SODIUM: 139 meq/L (ref 135–145)

## 2014-03-20 LAB — HEMOGLOBIN A1C: Hgb A1c MFr Bld: 5.5 % (ref 4.6–6.5)

## 2014-03-20 NOTE — Assessment & Plan Note (Signed)
Continue with current prescription therapy as reflected on the Med list.  

## 2014-03-20 NOTE — Assessment & Plan Note (Signed)
2012-remission Off steroids 2012-2013 2/14 relapsed Risks associated with treatment noncompliance were discussed. Compliance was encouraged.  Potential benefits of a long term steroid  use as well as potential risks  and complications were explained to the patient and were aknowledged.

## 2014-03-20 NOTE — Progress Notes (Deleted)
Pre visit review using our clinic review tool, if applicable. No additional management support is needed unless otherwise documented below in the visit note. 

## 2014-03-20 NOTE — Assessment & Plan Note (Signed)
10/15 R LS X ray UA

## 2014-03-20 NOTE — Assessment & Plan Note (Signed)
Continue with current prescription therapy as reflected on the Med list. Labs  

## 2014-03-25 NOTE — Progress Notes (Signed)
   Subjective:    HPI   F/u LBP The patient presents for a follow-up of  chronic hypertension, chronic dyslipidemia, type 2 diabetes, h/o CHF controlled with medicines. C/o severe pain in thor back and B neck/head pain L>R; stiff - taking Aleve and Tramadol F/u occ R temporal-occip HA   Wt Readings from Last 3 Encounters:  03/20/14 226 lb 8 oz (102.74 kg)  02/20/14 222 lb 12.8 oz (101.061 kg)  12/28/13 224 lb (101.606 kg)   BP Readings from Last 3 Encounters:  03/20/14 132/78  02/20/14 138/58  12/28/13 142/70    Review of Systems  Constitutional: Positive for fatigue. Negative for chills, activity change, appetite change and unexpected weight change.  HENT: Negative for congestion, mouth sores and sinus pressure.   Eyes: Negative for visual disturbance.  Respiratory: Positive for shortness of breath. Negative for cough and chest tightness.   Cardiovascular: Negative for chest pain.  Gastrointestinal: Negative for nausea, vomiting, abdominal pain, blood in stool and abdominal distention.  Genitourinary: Negative for frequency, difficulty urinating and vaginal pain.  Musculoskeletal: Positive for myalgias, back pain, arthralgias and gait problem. Negative for joint swelling.  Skin: Negative for pallor and rash.  Neurological: Negative for dizziness, tremors, weakness, numbness and headaches.  Psychiatric/Behavioral: Negative for suicidal ideas, confusion and sleep disturbance.        Objective:   Physical ExamL foot and L calf is swollen, tender  Lab Results  Component Value Date   WBC 8.1 03/20/2014   HGB 11.7* 03/20/2014   HCT 36.3 03/20/2014   PLT 269.0 03/20/2014   GLUCOSE 101* 03/20/2014   CHOL 240* 11/17/2013   TRIG 269.0* 11/17/2013   HDL 32.30* 11/17/2013   LDLDIRECT 147.8 05/08/2013   LDLCALC 154* 11/17/2013   ALT 12 03/20/2014   AST 23 03/20/2014   NA 139 03/20/2014   K 5.0 03/20/2014   CL 110 03/20/2014   CREATININE 1.7* 03/20/2014   BUN 25*  03/20/2014   CO2 23 03/20/2014   TSH 3.06 11/17/2013   INR 2.6 02/07/2014   HGBA1C 5.5 03/20/2014   MICROALBUR 3.1* 09/27/2006         Assessment & Plan:

## 2014-03-29 ENCOUNTER — Other Ambulatory Visit (HOSPITAL_BASED_OUTPATIENT_CLINIC_OR_DEPARTMENT_OTHER): Payer: Medicare Other

## 2014-03-29 DIAGNOSIS — Z853 Personal history of malignant neoplasm of breast: Secondary | ICD-10-CM

## 2014-03-29 DIAGNOSIS — D649 Anemia, unspecified: Secondary | ICD-10-CM

## 2014-03-29 LAB — CBC WITH DIFFERENTIAL/PLATELET
BASO%: 0.7 % (ref 0.0–2.0)
Basophils Absolute: 0.1 10*3/uL (ref 0.0–0.1)
EOS%: 4.8 % (ref 0.0–7.0)
Eosinophils Absolute: 0.3 10*3/uL (ref 0.0–0.5)
HCT: 34.9 % (ref 34.8–46.6)
HGB: 11 g/dL — ABNORMAL LOW (ref 11.6–15.9)
LYMPH#: 0.3 10*3/uL — AB (ref 0.9–3.3)
LYMPH%: 4.1 % — ABNORMAL LOW (ref 14.0–49.7)
MCH: 29.6 pg (ref 25.1–34.0)
MCHC: 31.5 g/dL (ref 31.5–36.0)
MCV: 94.1 fL (ref 79.5–101.0)
MONO#: 0.5 10*3/uL (ref 0.1–0.9)
MONO%: 7.1 % (ref 0.0–14.0)
NEUT#: 5.9 10*3/uL (ref 1.5–6.5)
NEUT%: 83.3 % — AB (ref 38.4–76.8)
Platelets: 246 10*3/uL (ref 145–400)
RBC: 3.71 10*6/uL (ref 3.70–5.45)
RDW: 15 % — ABNORMAL HIGH (ref 11.2–14.5)
WBC: 7.1 10*3/uL (ref 3.9–10.3)
nRBC: 0 % (ref 0–0)

## 2014-04-03 ENCOUNTER — Encounter: Payer: Self-pay | Admitting: Internal Medicine

## 2014-04-03 ENCOUNTER — Other Ambulatory Visit: Payer: Self-pay | Admitting: Internal Medicine

## 2014-04-06 ENCOUNTER — Other Ambulatory Visit: Payer: Self-pay | Admitting: Internal Medicine

## 2014-04-11 ENCOUNTER — Ambulatory Visit (INDEPENDENT_AMBULATORY_CARE_PROVIDER_SITE_OTHER): Payer: Medicare Other

## 2014-04-11 DIAGNOSIS — I82402 Acute embolism and thrombosis of unspecified deep veins of left lower extremity: Secondary | ICD-10-CM

## 2014-04-11 DIAGNOSIS — Z5181 Encounter for therapeutic drug level monitoring: Secondary | ICD-10-CM

## 2014-04-11 DIAGNOSIS — Z7901 Long term (current) use of anticoagulants: Secondary | ICD-10-CM

## 2014-04-11 LAB — POCT INR: INR: 2.3

## 2014-05-03 ENCOUNTER — Encounter (HOSPITAL_COMMUNITY): Payer: Self-pay | Admitting: Cardiology

## 2014-05-08 ENCOUNTER — Telehealth: Payer: Self-pay | Admitting: Family

## 2014-05-08 ENCOUNTER — Ambulatory Visit (INDEPENDENT_AMBULATORY_CARE_PROVIDER_SITE_OTHER): Payer: Medicare Other

## 2014-05-08 DIAGNOSIS — Z7901 Long term (current) use of anticoagulants: Secondary | ICD-10-CM

## 2014-05-08 DIAGNOSIS — Z5181 Encounter for therapeutic drug level monitoring: Secondary | ICD-10-CM

## 2014-05-08 DIAGNOSIS — Z86718 Personal history of other venous thrombosis and embolism: Secondary | ICD-10-CM

## 2014-05-08 DIAGNOSIS — I82402 Acute embolism and thrombosis of unspecified deep veins of left lower extremity: Secondary | ICD-10-CM

## 2014-05-08 DIAGNOSIS — I82509 Chronic embolism and thrombosis of unspecified deep veins of unspecified lower extremity: Secondary | ICD-10-CM

## 2014-05-08 LAB — POCT INR: INR: 1.6

## 2014-05-08 NOTE — Telephone Encounter (Signed)
Agree with plan 

## 2014-05-15 ENCOUNTER — Encounter: Payer: Self-pay | Admitting: Internal Medicine

## 2014-05-15 ENCOUNTER — Ambulatory Visit (INDEPENDENT_AMBULATORY_CARE_PROVIDER_SITE_OTHER): Payer: Medicare Other | Admitting: Internal Medicine

## 2014-05-15 ENCOUNTER — Ambulatory Visit (INDEPENDENT_AMBULATORY_CARE_PROVIDER_SITE_OTHER)
Admission: RE | Admit: 2014-05-15 | Discharge: 2014-05-15 | Disposition: A | Discharge status: Home / Self Care | Payer: Medicare Other | Source: Ambulatory Visit | Attending: Internal Medicine | Admitting: Internal Medicine

## 2014-05-15 ENCOUNTER — Other Ambulatory Visit (INDEPENDENT_AMBULATORY_CARE_PROVIDER_SITE_OTHER): Payer: Medicare Other

## 2014-05-15 VITALS — BP 124/78 | HR 65 | Ht 64.0 in | Wt 223.0 lb

## 2014-05-15 DIAGNOSIS — R7989 Other specified abnormal findings of blood chemistry: Secondary | ICD-10-CM

## 2014-05-15 DIAGNOSIS — R0609 Other forms of dyspnea: Secondary | ICD-10-CM

## 2014-05-15 DIAGNOSIS — I5023 Acute on chronic systolic (congestive) heart failure: Secondary | ICD-10-CM

## 2014-05-15 DIAGNOSIS — I4891 Unspecified atrial fibrillation: Secondary | ICD-10-CM

## 2014-05-15 DIAGNOSIS — Z9989 Dependence on other enabling machines and devices: Secondary | ICD-10-CM

## 2014-05-15 DIAGNOSIS — I6529 Occlusion and stenosis of unspecified carotid artery: Secondary | ICD-10-CM

## 2014-05-15 DIAGNOSIS — I5022 Chronic systolic (congestive) heart failure: Secondary | ICD-10-CM

## 2014-05-15 DIAGNOSIS — G4733 Obstructive sleep apnea (adult) (pediatric): Secondary | ICD-10-CM

## 2014-05-15 LAB — BASIC METABOLIC PANEL
BUN: 25 mg/dL — AB (ref 6–23)
CHLORIDE: 109 meq/L (ref 96–112)
CO2: 22 meq/L (ref 19–32)
Calcium: 8.7 mg/dL (ref 8.4–10.5)
Creatinine, Ser: 1.7 mg/dL — ABNORMAL HIGH (ref 0.4–1.2)
GFR: 31.25 mL/min — AB (ref >60.00)
Glucose, Bld: 92 mg/dL (ref 70–99)
Potassium: 4.5 mEq/L (ref 3.5–5.1)
Sodium: 140 mEq/L (ref 135–145)

## 2014-05-15 LAB — BRAIN NATRIURETIC PEPTIDE: Pro B Natriuretic peptide (BNP): 1795 pg/mL — ABNORMAL HIGH (ref 0.0–100.0)

## 2014-05-15 NOTE — Assessment & Plan Note (Signed)
I favor dyspnea on exertion being primarily related to her heart disease including left ventricular failure and some component of pulmonary hypertension. She desaturates with exertion but not at rest on room air. We will see if she is desaturating at night while wearing her CPAP on room air. Plan chest x-ray, d-dimer, overnight oximetry on room air plus CPAP

## 2014-05-15 NOTE — Progress Notes (Signed)
Subjective:    [name], female    DOB: August 03, 1935, 78 y.o.   MRN: [id number]  HPI 10/17/10- 54 yoF former smoker followed for OSA and COPD, complicated by DM, hx breast cancer, hx DVT. Here with husband. Last here Oct 17, 2009- note reviewed.  She denies any significant breathing issues since last here. Continues to use CPAP at 10 all night every night. Never recurrence of DVT left leg.  Noted minor cough when first lying down- they are not concerned.   10/22/11- 37 yoF former smoker followed for OSA and COPD, complicated by DM, hx breast cancer, hx DVT.... Here with husband. Denies any SOB, wheezing, cough, or congestion. Wears CPAP every night for 5-6 hours approx. Stable dyspnea on exertion with hills and stairs. Hospitalized from February 18 through 26 with pulmonary edema, history AFib, chronic Coumadin, history of DVT. Ejection fraction 25-30%.  01/29/12- 19 yoF former smoker followed for OSA and COPD, complicated by DM, hx breast cancer, hx DVT.... Here with husband. ACUTE VISIT: Increased SOB and worse at night; get raspy breathing prior to SOB starting up. This episode made her feel like her stomach was going to pop. Hospital 8/20 through 01/13/2012-right carotid endarterectomy/Dr. Bridgett Larsson. Occasional episodes all summer described as onset of shortness of breath in the evening after dinner when she is reading or watching TV or it she starts clearing her throat and feels bloated in the abdomen which makes her more short of breath. Worse if lying down. Denies fever, purulent discharge, palpitation or chest pain. 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 CPAP all night every night. She has been mis- using Advair, saving it for her episodes of dyspnea, so she uses it once at night with 2 or 3 activations. I reeducated her on use as a maintenance inhaler.  03/15/12- 28 yoF former smoker followed for OSA and  COPD, complicated by DM, hx breast cancer, hx DVT.... Here with husband.  Pt states that SOB has improved since last OV. No complaints She feels well and is walking more. Recognizes fluid retention causes shortness of breath. Her diuretic therapy helps. Pleased with CPAP 10/Advanced used all night every night. CXR 02/02/12-reviewed with her IMPRESSION:  Cardiomegaly. COPD/chronic changes. No active disease.  Original Report Authenticated By: Raelyn Number, M.D.   09/13/12- 63 yoF former smoker followed for OSA and COPD, complicated by DM, hx breast cancer, hx DVT.... Here with husband. FOLLOWS FOR:SOB at times and feelings of not being able to breathe-denies this happens with activity; states she goes to bed reading and about 30 minutes later she cant breathe and feels congestion. CPAP 10/ Advanced Is positional dyspnea happens about one time a month and is relieved by sitting up and waiting for it to go away.intends to start before she puts her CPAP on. She has been using either Dulera or Advair intermittently as rescue inhalers which we discussed. She emphasizes that she gets acutely short of breath and will grab for anything. She wasn't satisfied to discuss the difference between maintenance and rescue inhalers. She is being worked up for anemia. Glaucoma is treated with Timoptic which may cause bronchospasm. History of diastolic heart failure followed by cardiology. CXR 05/03/12 IMPRESSION:  Cardiac enlargement with pulmonary vascular congestion and  interstitial edema similar to previous study.  Original Report Authenticated By: Lucienne Capers, M.D.  11/14/12-  47 yoF former smoker followed for OSA and COPD, complicated by  DM, hx breast cancer, hx DVT, hx anemia.... Here with husband. FOLLWS FOR: review PFT with patient; Occasional SOB at times. Continues CPAP 10/ [employer] Reports little cough. BNP 09/13/12- 458, down from 3006 PFT- 11/14/2012-normal spirometry flows without response to  bronchodilator, normal lung volumes, diffusion moderately reduced/55% predicted CXR 09/13/12 IMPRESSION:  Enlargement of cardiac silhouette with pulmonary vascular  congestion.  Minimal chronic bronchitic changes with left basilar atelectasis.  Original Report Authenticated By: Lavonia Dana, M.D.  05/15/13- 28 yoF former smoker followed for OSA and COPD, complicated by DM, hx breast cancer, hx DVT, hx anemia.... Here with husband. FOLLOWS FOR: SOB and wheezing at times(happens all of a sudden); Has had to use Advair  If upset or nervous her breathing gets raspy and short of breath. She sits quietly and takes chlorazepate her breathing becomes comfortable again. Uses Proventil and Advair interchangeably as rescue inhalers-educated on this. Occasional prednisone, about 3 days a month, for arthralgias CPAP 10/Advanced good compliance and control  11/13/13- 69 yoF former smoker followed for OSA and COPD, complicated by DM, hx breast cancer, hx DVT, hx anemia.... Here with husband. FOLLOWS FOR:  Breathing is unchanged since last OV, has good and bad days with weather.  Wearing CPAP 10/ Advanced 8 hours per night She feels CPAP is doing well. Sleeps well. Occasional shortness of breath mostly with exertion. Prednisone 3 days per week for arthritis  05/15/14-78 yoF former smoker followed for OSA and COPD, complicated by DM, hx breast cancer, hx DVT, hx anemia.... Here with husband FOLLOW FOR: gets short of breath walking from one end to the other end of the house; she has a habit of holding her breath and has been trying to deliberately breath as she is moving about.  Weather has been causing her a lot of trouble breathing.   CPAP 10/ Advanced- won't sleep w/o. Persistent dyspnea on exertion 6 months, not progressive. Some better days. Occasionally little wheeze but not much cough, no chest pain. Occasional palpitation. Feet swell a little. Echocardiogram 03/01/2014-PAS 69, mild RAE, severe LAE Walk  test today on room air: Resting saturation 91-92%. Desaturated walking to 86% on room air Diffusion moderately reduced with normal spirometry flows on PFT and 2014.  ROS-see HPI Constitutional:   No-   weight loss, night sweats, fevers, chills, fatigue, lassitude. HEENT:   No-  headaches, difficulty swallowing, tooth/dental problems, sore throat,       No-  sneezing, itching, ear ache, nasal congestion, post nasal drip,  CV:  No-   chest pain, +orthopnea, +PND, swelling in lower extremities, anasarca, dizziness, palpitations Resp: +shortness of breath with exertion or at rest.              No-   productive cough,  No non-productive cough,  No- coughing up of blood.              No-   change in color of mucus.  No- wheezing.   Skin: No-   rash or lesions. GI:  No-   heartburn, indigestion, abdominal pain, nausea, vomiting,  GU:  MS:  + joint pain or swelling.   Neuro-     nothing unusual Psych:  No- change in mood or affect. No depression or anxiety.  No memory loss.  OBJ- Physical Exam General- Alert, Oriented, Affect-appropriate, Distress- none acute, overweight Skin- rash-none, lesions- none, excoriation- none Lymphadenopathy- none Head- atraumatic            Eyes- Gross vision intact, PERRLA, conjunctivae and  secretions clear            Ears- Hearing, canals-normal            Nose- Clear, no-Septal dev, mucus, polyps, erosion, perforation             Throat- Mallampati II , mucosa clear , drainage- none, tonsils- atrophic Neck- flexible , trachea midline, no stridor , thyroid nl, + Bilateral carotid scars Chest - symmetrical excursion , unlabored           Heart/CV- RRR , +4/2 systolic Aortic murmur , no gallop  , no rub, nl s1 s2                           - JVD- none , edema+trace, stasis changes- none, varices- none           Lung- +few rales R>L base, wheeze- none, cough- none , dullness-none, rub- none           Chest wall-  Abd-  Br/ Gen/ Rectal- Not done, not  indicated Extrem- cyanosis- none, clubbing, none, atrophy- none, strength- nl Neuro- grossly intact to observation

## 2014-05-15 NOTE — Patient Instructions (Signed)
Order- walk test on room air   Dyspnea on exertion  Order- CXR   dyspnea on exertion,   Order- lab- D-dimer, BNP, BMET   Dyspnea on exertion  Order- download CPAP/ Advanced   Dx OSA  Please call as needed

## 2014-05-15 NOTE — Assessment & Plan Note (Signed)
Good compliance and control with CPAP at 10. She says she "loves my CPAP".

## 2014-05-16 LAB — D-DIMER, QUANTITATIVE: D-Dimer, Quant: 1.11 ug/mL-FEU — ABNORMAL HIGH (ref 0.00–0.48)

## 2014-05-17 ENCOUNTER — Other Ambulatory Visit: Payer: Self-pay | Admitting: Internal Medicine

## 2014-05-17 ENCOUNTER — Telehealth: Payer: Self-pay | Admitting: Internal Medicine

## 2014-05-17 ENCOUNTER — Ambulatory Visit (HOSPITAL_COMMUNITY): Admission: RE | Admit: 2014-05-17 | Payer: Medicare Other | Source: Ambulatory Visit

## 2014-05-17 DIAGNOSIS — R7989 Other specified abnormal findings of blood chemistry: Secondary | ICD-10-CM

## 2014-05-17 NOTE — Telephone Encounter (Signed)
Pt is aware that her D-dimer level was elevated and CY expressed concern of this and wants her to have a VQ scan as her kidneys would not handle the contrast dye well enough for a CT chest.  Pt states she can not go for her scan today as she has too much to do. I explained to patient that we really do not need to wait until her scheduled date for VQ scan on Monday 05-21-14. She then wanted to know if we could schedule the VQ scan for Friday 05-18-14 AM; [facility] contacted the VQ department and they state they do not have anything. Per CY patient can wait until Monday 05-21-14 to have VQ scan with the understanding that she is to seek emergency care ASAP if symptoms worsen through the holiday weekend. Pt and her spouse are both aware of this. Husband stated if patient got worse then he would call 911 to get help STAT. Nothing more needed at this time.

## 2014-05-21 ENCOUNTER — Ambulatory Visit (HOSPITAL_COMMUNITY)
Admission: RE | Admit: 2014-05-21 | Discharge: 2014-05-21 | Disposition: A | Discharge status: Home / Self Care | Payer: Medicare Other | Source: Ambulatory Visit | Attending: Internal Medicine | Admitting: Internal Medicine

## 2014-05-21 DIAGNOSIS — R791 Abnormal coagulation profile: Secondary | ICD-10-CM | POA: Diagnosis not present

## 2014-05-21 DIAGNOSIS — R7989 Other specified abnormal findings of blood chemistry: Secondary | ICD-10-CM

## 2014-05-21 DIAGNOSIS — R0602 Shortness of breath: Secondary | ICD-10-CM | POA: Diagnosis present

## 2014-05-21 MED ORDER — TECHNETIUM TO 99M ALBUMIN AGGREGATED
5.4000 | Freq: Once | INTRAVENOUS | Status: AC | PRN
  Administered 2014-05-21: 5 via INTRAVENOUS

## 2014-05-21 MED ORDER — TECHNETIUM TC 99M DIETHYLENETRIAME-PENTAACETIC ACID: 44.0000 | Freq: Once | INTRAVENOUS | Status: AC | PRN

## 2014-05-22 ENCOUNTER — Ambulatory Visit (INDEPENDENT_AMBULATORY_CARE_PROVIDER_SITE_OTHER): Payer: Medicare Other | Admitting: Family Medicine

## 2014-05-22 ENCOUNTER — Telehealth: Payer: Self-pay | Admitting: Internal Medicine

## 2014-05-22 DIAGNOSIS — I82509 Chronic embolism and thrombosis of unspecified deep veins of unspecified lower extremity: Secondary | ICD-10-CM

## 2014-05-22 DIAGNOSIS — I82402 Acute embolism and thrombosis of unspecified deep veins of left lower extremity: Secondary | ICD-10-CM

## 2014-05-22 DIAGNOSIS — Z5181 Encounter for therapeutic drug level monitoring: Secondary | ICD-10-CM

## 2014-05-22 DIAGNOSIS — Z7901 Long term (current) use of anticoagulants: Secondary | ICD-10-CM

## 2014-05-22 DIAGNOSIS — Z86718 Personal history of other venous thrombosis and embolism: Secondary | ICD-10-CM

## 2014-05-22 LAB — POCT INR: INR: 1.7

## 2014-05-22 NOTE — Telephone Encounter (Signed)
Pt aware and copies mailed to confirmed home address.

## 2014-05-23 ENCOUNTER — Telehealth: Payer: Self-pay | Admitting: *Deleted

## 2014-05-23 DIAGNOSIS — R0602 Shortness of breath: Secondary | ICD-10-CM

## 2014-05-23 DIAGNOSIS — Z79899 Other long term (current) drug therapy: Secondary | ICD-10-CM

## 2014-05-23 DIAGNOSIS — R609 Edema, unspecified: Secondary | ICD-10-CM

## 2014-05-23 MED ORDER — TORSEMIDE 20 MG PO TABS: ORAL_TABLET | ORAL | Status: DC

## 2014-05-23 NOTE — Telephone Encounter (Signed)
She has CR of 1.7 take demedex 20 mg every other day instead of 10 mg daily F/U BMET and BNP in 3 weeks F/u primary      ----- Message -----     From: Lorane Gell, CMA     Sent: 05/22/2014  5:25 PM      To: Josue Hector, MD, Cassandria Anger, MD                PT NOTIFIED./CY

## 2014-06-01 ENCOUNTER — Encounter: Payer: Self-pay | Admitting: Family

## 2014-06-04 ENCOUNTER — Encounter: Payer: Self-pay | Admitting: Family

## 2014-06-04 ENCOUNTER — Ambulatory Visit (HOSPITAL_COMMUNITY)
Admission: RE | Admit: 2014-06-04 | Discharge: 2014-06-04 | Disposition: A | Discharge status: Home / Self Care | Payer: Medicare Other | Source: Ambulatory Visit | Attending: Family | Admitting: Family

## 2014-06-04 ENCOUNTER — Ambulatory Visit (INDEPENDENT_AMBULATORY_CARE_PROVIDER_SITE_OTHER): Payer: Medicare Other | Admitting: Family

## 2014-06-04 VITALS — BP 141/61 | HR 57 | Resp 14 | Ht 64.0 in | Wt 219.0 lb

## 2014-06-04 DIAGNOSIS — Z48812 Encounter for surgical aftercare following surgery on the circulatory system: Secondary | ICD-10-CM | POA: Diagnosis not present

## 2014-06-04 DIAGNOSIS — I6523 Occlusion and stenosis of bilateral carotid arteries: Secondary | ICD-10-CM

## 2014-06-04 DIAGNOSIS — Z86718 Personal history of other venous thrombosis and embolism: Secondary | ICD-10-CM

## 2014-06-04 DIAGNOSIS — Z9889 Other specified postprocedural states: Secondary | ICD-10-CM

## 2014-06-04 NOTE — Patient Instructions (Signed)
Stroke Prevention Some medical conditions and behaviors are associated with an increased chance of having a stroke. You may prevent a stroke by making healthy choices and managing medical conditions. HOW CAN I REDUCE MY RISK OF HAVING A STROKE?   Stay physically active. Get at least 30 minutes of activity on most or all days.  Do not smoke. It may also be helpful to avoid exposure to secondhand smoke.  Limit alcohol use. Moderate alcohol use is considered to be:  No more than 2 drinks per day for men.  No more than 1 drink per day for nonpregnant women.  Eat healthy foods. This involves:  Eating 5 or more servings of fruits and vegetables a day.  Making dietary changes that address high blood pressure (hypertension), high cholesterol, diabetes, or obesity.  Manage your cholesterol levels.  Making food choices that are high in fiber and low in saturated fat, trans fat, and cholesterol may control cholesterol levels.  Take any prescribed medicines to control cholesterol as directed by your health care provider.  Manage your diabetes.  Controlling your carbohydrate and sugar intake is recommended to manage diabetes.  Take any prescribed medicines to control diabetes as directed by your health care provider.  Control your hypertension.  Making food choices that are low in salt (sodium), saturated fat, trans fat, and cholesterol is recommended to manage hypertension.  Take any prescribed medicines to control hypertension as directed by your health care provider.  Maintain a healthy weight.  Reducing calorie intake and making food choices that are low in sodium, saturated fat, trans fat, and cholesterol are recommended to manage weight.  Stop drug abuse.  Avoid taking birth control pills.  Talk to your health care provider about the risks of taking birth control pills if you are over 35 years old, smoke, get migraines, or have ever had a blood clot.  Get evaluated for sleep  disorders (sleep apnea).  Talk to your health care provider about getting a sleep evaluation if you snore a lot or have excessive sleepiness.  Take medicines only as directed by your health care provider.  For some people, aspirin or blood thinners (anticoagulants) are helpful in reducing the risk of forming abnormal blood clots that can lead to stroke. If you have the irregular heart rhythm of atrial fibrillation, you should be on a blood thinner unless there is a good reason you cannot take them.  Understand all your medicine instructions.  Make sure that other conditions (such as anemia or atherosclerosis) are addressed. SEEK IMMEDIATE MEDICAL CARE IF:   You have sudden weakness or numbness of the face, arm, or leg, especially on one side of the body.  Your face or eyelid droops to one side.  You have sudden confusion.  You have trouble speaking (aphasia) or understanding.  You have sudden trouble seeing in one or both eyes.  You have sudden trouble walking.  You have dizziness.  You have a loss of balance or coordination.  You have a sudden, severe headache with no known cause.  You have new chest pain or an irregular heartbeat. Any of these symptoms may represent a serious problem that is an emergency. Do not wait to see if the symptoms will go away. Get medical help at once. Call your local emergency services (911 in [place] Do not drive yourself to the hospital. Document Released: 06/18/2004 Document Revised: 09/25/2013 Document Reviewed: 11/11/2012 Exit[employer] Patient Information 2015 [employer]. This information is not intended to replace advice given   to you by your health care provider. Make sure you discuss any questions you have with your health care provider.  

## 2014-06-04 NOTE — Progress Notes (Signed)
Established Carotid Patient   History of Present Illness  Wendy Cole is a 79 y.o. female patient of Dr. Bridgett Larsson who is s/p R CEA (01/12/12) and L CEA (11/19/11). The patient has had no stroke sx since the 1990's. She has a history of previous DVT in left leg.  Her worst issue is low back pain; states she is scheduled to see her PCP this month. Her walking is limited by dyspnea and low back pain. She has COPD, also has OSA, uses CPAP. Intermittent left lower leg inner aspect pain since about September 2014, denies injury, admits to being sedentary, denies long trips recently.  Venous Duplex at her January 2015 visit was negative for acute DVT, she did have chronic DVT and remains on coumadin. Does not wear compression hose, left lower legs varicose veins hurt if she wears knee hight compression hose. Pt states legs do not swell, but feet do swell at times, states her diuretic helps this. Dr. Johnsie Cancel is her cardiologist, Dr. Annamaria Boots is her pulmonologist. She states that her PCP manages her coumadin. States she has never had IVC filter placed. 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  Patient has Positive history of TIA or stroke symptom in the 1990's as manifested by slurred speech, loss of some degree of balance. The patient denies amaurosis fugax or monocular blindness. The patient denies facial drooping.  Pt Diabetic: No Pt smoker: former smoker, quit in 2004  Pt meds include: Statin : No: cannot tolerate due to severe myalgias ASA: No: Other anticoagulants/antiplatelets: coumadin  Past Medical History  Diagnosis Date  . Anemia   . Anxiety states   . Depressive disorder, not elsewhere classified   . Type II or unspecified type diabetes mellitus without mention of complication, not stated as uncontrolled   . Hyperlipidemia   . Unspecified essential hypertension   . Renal insufficiency   . Osteoarthritis   . Gout   . Iritis   . Nonischemic cardiomyopathy     EF 20 to 25%  per echo 06/2011  . Chronic anticoagulation   . NSTEMI (non-ST elevated myocardial infarction) Feb 2013    06/2011 cath  Mild nonobstructive disease. No LV gram  . PAF (paroxysmal atrial fibrillation)   . Obesity   . Cancer 2010    Right breast  . DVT (deep venous thrombosis) 2010  . Stroke 2004    Left upper lobe  . Thyroid disease 1969    Three fourth of Thyroid removed  . Complication of anesthesia     "fights it", for colonoscopy  . Flash pulmonary edema     06/2011  . HOH (hard of hearing)     deaf in L completely  . OSA on CPAP     CPAP at night  . GERD (gastroesophageal reflux disease)   . Hypothyroidism   . Chronic kidney disease (CKD), stage III (moderate)   . Peripheral vascular disease Jan. 2015    Left leg pain and swelling    Social History History  Substance Use Topics  . Smoking status: Former Smoker -- 2.00 packs/day for 40 years    Types: Cigarettes    Quit date: 05/25/2002  . Smokeless tobacco: Never Used  . Alcohol Use: No    Family History Family History  Problem Relation Age of Onset  . Hypertension    . Heart disease Mother 21    CHF  . Cancer Mother     breast  . Heart attack Mother   .  Heart disease Father 45    MI  . Heart disease Brother     AAA  . Coronary artery disease Son 40    2 Stents    Surgical History Past Surgical History  Procedure Laterality Date  . Breast lumpectomy    . Cataract extraction    . Foot surgery    . Tonsillectomy  1949  . Abdominal hysterectomy  1974  . Eye surgery  2008    Glaucoma shunt Right eye  . Parotid gland tumor excision  2000  . Cystectomy  1974    Left Breast, chin, left of groin area  . Thyroidectomy, partial  1969  . Endarterectomy  11/18/2011    Procedure: ENDARTERECTOMY CAROTID;  Surgeon: Conrad Wheatland, MD;  Location: [facility];  Service: Vascular;  Laterality: Left;  [name] Mastectomy  right  . Mastectomy    . Cardiac catheterization      no stent  . Colonoscopy w/ polypectomy    .  Endarterectomy  01/12/2012    Procedure: ENDARTERECTOMY CAROTID;  Surgeon: Conrad [place], MD;  Location: [facility] OR;  Service: Vascular;  Laterality: Right;  Right carotid endarterectomy with 1cm x 6cm vascuguard patch angioplasty.  . Carotid endarterectomy Left 11-18-11    cea  . Carotid endarterectomy Right 01-12-12    cea  . Left and right heart catheterization with coronary/graft angiogram Left 07/20/2011    Procedure: LEFT AND RIGHT HEART CATHETERIZATION WITH Beatrix Fetters;  Surgeon: Peter M Martinique, MD;  Location: [facility] CATH LAB;  Service: Cardiovascular;  Laterality: Left;    Allergies  Allergen Reactions  . Amlodipine     edema  . Atorvastatin Other (See Comments)    Muscle aches  . Colesevelam Other (See Comments)    unknown  . Lasix [Furosemide] Other (See Comments)    Muscle cramps  . Statins Other (See Comments)    Muscle aches  . Tape Rash    Current Outpatient Prescriptions  Medication Sig Dispense Refill  . ADVAIR DISKUS 100-50 MCG/DOSE AEPB INHALE 1 PUFF INTO THE LUNGS EVERY 12 HOURS 60 each 5  . albuterol (PROVENTIL HFA;VENTOLIN HFA) 108 (90 BASE) MCG/ACT inhaler Inhale 2 puffs into the lungs every 6 (six) hours as needed for wheezing or shortness of breath. 1 Inhaler 6  . allopurinol (ZYLOPRIM) 100 MG tablet TAKE 1 TABLET three times a week    . brimonidine (ALPHAGAN) 0.15 % ophthalmic solution Place 1 drop into both eyes daily.     [name] buPROPion (WELLBUTRIN SR) 150 MG 12 hr tablet every morning.   3  . calcium carbonate (CALCIUM 500) 1250 MG tablet Take 1 tablet by mouth. Three days a week    . carvedilol (COREG) 12.5 MG tablet TAKING 2 TABLETS IN THE MORNING AND 1 AT NIGHT 270 tablet 3  . clorazepate (TRANXENE) 7.5 MG tablet TAKE 1 TABLET BY MOUTH TWICE A DAY AS NEEDED FOR ANXIETY 60 tablet 5  . ferrous sulfate 325 (65 FE) MG EC tablet Take 325 mg by mouth daily.    . hydrALAZINE (APRESOLINE) 25 MG tablet TAKE 1 TABLET (25 MG TOTAL) BY MOUTH 3 (THREE) TIMES DAILY.  90 tablet 5  . isosorbide mononitrate (IMDUR) 60 MG 24 hr tablet TAKE 1 TABLET (60 MG TOTAL) BY MOUTH DAILY. 30 tablet 3  . levothyroxine (SYNTHROID, LEVOTHROID) 137 MCG tablet Take 1 tablet (137 mcg total) by mouth daily after breakfast. 90 tablet 3  . omeprazole (PRILOSEC) 40 MG capsule Take 1 capsule (40  mg total) by mouth daily. 30 capsule 11  . prednisoLONE acetate (PRED FORTE) 1 % ophthalmic suspension Place 1 drop into both eyes daily as needed. For eye pain    . predniSONE (DELTASONE) 5 MG tablet     . timolol (TIMOPTIC) 0.5 % ophthalmic solution Place 1 drop into both eyes 2 (two) times daily. Twice daily    . torsemide (DEMADEX) 20 MG tablet TAKE  1 TAB  EVERY OTHER  DAY 15 tablet 6  . traMADol (ULTRAM) 50 MG tablet TAKE 1-2 TABLETS BY MOUTH 2 TIMES DAILY AS NEEDED FOR MODERATE OR SEVERE PAIN 50 tablet 0  . triamcinolone cream (KENALOG) 0.1 % Apply 1 application topically 2 (two) times daily. 90 g 3  . vitamin D, CHOLECALCIFEROL, 400 UNITS tablet Take 400 Units by mouth. Three days a week    . warfarin (COUMADIN) 1 MG tablet TAKE 2 TABLETS MON AND THURS AND TAKE 3 TABLETS ON ALL OTHER DAYS 76 tablet 4  . warfarin (COUMADIN) 3 MG tablet TAKE 1 TABLET (3 MG TOTAL) BY MOUTH DAILY OR AS DIRECTED BY PHYSICIAN. 30 tablet 2   No current facility-administered medications for this visit.    Review of Systems : See HPI for pertinent positives and negatives.  Physical Examination  Filed Vitals:   06/04/14 1219  BP: 141/61  Pulse: 57  Resp: 14  Height: 5\' 4"  (1.626 m)  Weight: 219 lb (99.338 kg)  SpO2: 98%   Body mass index is 37.57 kg/(m^2).  General: WDWN obese female in NAD GAIT: normal Eyes: PERRLA Pulmonary: CTAB, Negative Rales, Negative rhonchi, & Negative wheezing.  Cardiac: regular Rhythm , Positive murmur.  VASCULAR EXAM Carotid Bruits Left Right   Negative Negative   Aorta is not palpable. Radial pulses are 2+ palpable and equal.       LE Pulses LEFT RIGHT   FEMORAL not palpable, possibly due to body habitus, obesity  not palpable, possibly due to body habitus, obesity     POPLITEAL not palpable  not palpable   POSTERIOR TIBIAL not palpable  not palpable    DORSALIS PEDIS  ANTERIOR TIBIAL not palpable  not palpable     Gastrointestinal: soft, nontender, BS WNL, no r/g, no palpable masses.  Musculoskeletal: Negative muscle atrophy/wasting. M/S 5/5 throughout, Extremities without ischemic changes. Both lower legs are equal in size, no edema, no red streaking.   Neurologic: A&O X 3; Appropriate Affect ; SENSATION ;normal;  Speech is normal CN 2-12 intact except is slightly hard of hearing, Pain and light touch intact in extremities, Motor exam as listed above.    Non-Invasive Vascular Imaging CAROTID DUPLEX 06/04/2014 CEREBROVASCULAR DUPLEX EVALUATION    INDICATION: Carotid artery disease     PREVIOUS INTERVENTION(S): Right carotid endarterectomy 01/12/2012. Left carotid endarterectomy 11/18/2011.    DUPLEX EXAM:     RIGHT  LEFT  Peak Systolic Velocities (cm/s) End Diastolic Velocities (cm/s) Plaque LOCATION Peak Systolic Velocities (cm/s) End Diastolic Velocities (cm/s) Plaque  77 5  CCA PROXIMAL 69 6   72 6  CCA MID 71 1   53 9  CCA DISTAL 64 8   86 0  ECA 75 0   48 7  ICA PROXIMAL 60 6   73 15  ICA MID 95 18   91 19  ICA DISTAL 62 10     NA ICA / CCA Ratio (PSV) NA  Antegrade  Vertebral Flow Antegrade   Mastectomy  Brachial Systolic Pressure (mmHg)    Brachial  Artery Waveforms     Plaque Morphology:  HM = Homogeneous, HT = Heterogeneous, CP = Calcific Plaque, SP = Smooth Plaque, IP = Irregular Plaque     ADDITIONAL FINDINGS:     IMPRESSION: Patent right and left carotid endarterectomy sites with no evidence of hyperplasia or restenosis.      Compared to the previous exam:  No significant change in comparison to the last exam on 05/29/2013.      Assessment: KAVERI PERRAS is a 79 y.o. female who presents with asymptomatic patent right and left carotid endarterectomy sites with no evidence of hyperplasia or restenosis.  No significant change in comparison to the last exam on 05/29/2013. Face to face time with patient was 20 minutes. Over 50% of this time was spent on counseling and coordination of care.    Plan: Follow-up in 1 year with Carotid Duplex.  I discussed in depth with the patient the nature of atherosclerosis, and emphasized the importance of maximal medical management including strict control of blood pressure, blood glucose, and lipid levels, obtaining regular exercise, and continued cessation of smoking.  The patient is aware that without maximal medical management the underlying atherosclerotic disease process will progress, limiting the benefit of any interventions. The patient was given information about stroke prevention and what symptoms should prompt the patient to seek immediate medical care. Thank you for allowing [place] to participate in this patient's care.  Clemon Chambers, RN, MSN, FNP-C [facility] Office: [phone]  Clinic Physician: Trula Slade  06/04/2014 12:25 PM

## 2014-06-04 NOTE — Addendum Note (Signed)
Addended by: Mena Goes on: 06/04/2014 03:50 PM   Modules accepted: Orders

## 2014-06-12 ENCOUNTER — Ambulatory Visit: Payer: Medicare Other

## 2014-06-13 ENCOUNTER — Ambulatory Visit (INDEPENDENT_AMBULATORY_CARE_PROVIDER_SITE_OTHER): Payer: Medicare Other

## 2014-06-13 DIAGNOSIS — Z5181 Encounter for therapeutic drug level monitoring: Secondary | ICD-10-CM

## 2014-06-13 DIAGNOSIS — I82402 Acute embolism and thrombosis of unspecified deep veins of left lower extremity: Secondary | ICD-10-CM

## 2014-06-13 LAB — POCT INR: INR: 1.8

## 2014-06-13 NOTE — Progress Notes (Signed)
Pt states that she did not make the changes recommended during last coumadin clinic visit on 05/22/14, which was to increase coumadin dose to Take 2 mg daily except take 4 mg on Tuesdays.

## 2014-06-14 ENCOUNTER — Other Ambulatory Visit (INDEPENDENT_AMBULATORY_CARE_PROVIDER_SITE_OTHER): Payer: Medicare Other | Admitting: *Deleted

## 2014-06-14 DIAGNOSIS — Z79899 Other long term (current) drug therapy: Secondary | ICD-10-CM

## 2014-06-14 DIAGNOSIS — E785 Hyperlipidemia, unspecified: Secondary | ICD-10-CM

## 2014-06-14 DIAGNOSIS — R0602 Shortness of breath: Secondary | ICD-10-CM

## 2014-06-14 DIAGNOSIS — R609 Edema, unspecified: Secondary | ICD-10-CM

## 2014-06-14 LAB — BASIC METABOLIC PANEL
BUN: 27 mg/dL — ABNORMAL HIGH (ref 6–23)
CALCIUM: 8.9 mg/dL (ref 8.4–10.5)
CHLORIDE: 107 meq/L (ref 96–112)
CO2: 23 mEq/L (ref 19–32)
CREATININE: 1.73 mg/dL — AB (ref 0.40–1.20)
GFR: 30.41 mL/min — ABNORMAL LOW (ref >60.00)
Glucose, Bld: 109 mg/dL — ABNORMAL HIGH (ref 70–99)
Potassium: 4.4 mEq/L (ref 3.5–5.1)
SODIUM: 137 meq/L (ref 135–145)

## 2014-06-14 LAB — BRAIN NATRIURETIC PEPTIDE: Pro B Natriuretic peptide (BNP): 1675 pg/mL — ABNORMAL HIGH (ref 0.0–100.0)

## 2014-06-20 ENCOUNTER — Ambulatory Visit (INDEPENDENT_AMBULATORY_CARE_PROVIDER_SITE_OTHER): Payer: Medicare Other

## 2014-06-20 ENCOUNTER — Telehealth: Payer: Self-pay | Admitting: Cardiovascular Disease

## 2014-06-20 DIAGNOSIS — Z7901 Long term (current) use of anticoagulants: Secondary | ICD-10-CM

## 2014-06-20 DIAGNOSIS — Z86718 Personal history of other venous thrombosis and embolism: Secondary | ICD-10-CM

## 2014-06-20 DIAGNOSIS — I82402 Acute embolism and thrombosis of unspecified deep veins of left lower extremity: Secondary | ICD-10-CM

## 2014-06-20 DIAGNOSIS — Z5181 Encounter for therapeutic drug level monitoring: Secondary | ICD-10-CM

## 2014-06-20 DIAGNOSIS — Z79899 Other long term (current) drug therapy: Secondary | ICD-10-CM

## 2014-06-20 DIAGNOSIS — R0602 Shortness of breath: Secondary | ICD-10-CM

## 2014-06-20 DIAGNOSIS — I509 Heart failure, unspecified: Secondary | ICD-10-CM

## 2014-06-20 DIAGNOSIS — I82509 Chronic embolism and thrombosis of unspecified deep veins of unspecified lower extremity: Secondary | ICD-10-CM

## 2014-06-20 LAB — POCT INR: INR: 1.8

## 2014-06-20 NOTE — Telephone Encounter (Signed)
New Message  Pt called states that he has received a call to discuss his wife labs. Requests a call back

## 2014-06-20 NOTE — Telephone Encounter (Signed)
PT AWARE OF LAB RESULTS./CY 

## 2014-06-22 ENCOUNTER — Encounter: Payer: Self-pay | Admitting: Internal Medicine

## 2014-06-22 ENCOUNTER — Ambulatory Visit (INDEPENDENT_AMBULATORY_CARE_PROVIDER_SITE_OTHER): Payer: Medicare Other | Admitting: Internal Medicine

## 2014-06-22 VITALS — BP 170/68 | Temp 97.5°F | Resp 60 | Wt 215.0 lb

## 2014-06-22 DIAGNOSIS — I6523 Occlusion and stenosis of bilateral carotid arteries: Secondary | ICD-10-CM

## 2014-06-22 DIAGNOSIS — J019 Acute sinusitis, unspecified: Secondary | ICD-10-CM | POA: Insufficient documentation

## 2014-06-22 DIAGNOSIS — J0100 Acute maxillary sinusitis, unspecified: Secondary | ICD-10-CM

## 2014-06-22 DIAGNOSIS — I480 Paroxysmal atrial fibrillation: Secondary | ICD-10-CM

## 2014-06-22 DIAGNOSIS — I5032 Chronic diastolic (congestive) heart failure: Secondary | ICD-10-CM

## 2014-06-22 DIAGNOSIS — E039 Hypothyroidism, unspecified: Secondary | ICD-10-CM

## 2014-06-22 MED ORDER — CEFUROXIME AXETIL 250 MG PO TABS: 250.0000 mg | ORAL_TABLET | Freq: Two times a day (BID) | ORAL | Status: DC

## 2014-06-22 NOTE — Assessment & Plan Note (Signed)
Ceftin x10 d 

## 2014-06-22 NOTE — Progress Notes (Signed)
Pre visit review using our clinic review tool, if applicable. No additional management support is needed unless otherwise documented below in the visit note. 

## 2014-06-22 NOTE — Progress Notes (Signed)
   Subjective:    HPI  C/o URI sx's x 1 week  F/u LBP The patient presents for a follow-up of  chronic hypertension, chronic dyslipidemia, type 2 diabetes, h/o CHF controlled with medicines. C/o severe pain in thor back and B neck/head pain L>R; stiff - taking Aleve and Tramadol F/u occ R temporal-occip HA   Wt Readings from Last 3 Encounters:  06/22/14 215 lb (97.523 kg)  06/04/14 219 lb (99.338 kg)  05/15/14 223 lb (101.152 kg)   BP Readings from Last 3 Encounters:  06/22/14 170/68  06/04/14 141/61  05/15/14 124/78    Review of Systems  Constitutional: Positive for fatigue. Negative for chills, activity change, appetite change and unexpected weight change.  HENT: Negative for congestion, mouth sores and sinus pressure.   Eyes: Negative for visual disturbance.  Respiratory: Positive for shortness of breath. Negative for cough and chest tightness.   Cardiovascular: Negative for chest pain.  Gastrointestinal: Negative for nausea, vomiting, abdominal pain, blood in stool and abdominal distention.  Genitourinary: Negative for frequency, difficulty urinating and vaginal pain.  Musculoskeletal: Positive for myalgias, back pain, arthralgias and gait problem. Negative for joint swelling.  Skin: Negative for pallor and rash.  Neurological: Negative for dizziness, tremors, weakness, numbness and headaches.  Psychiatric/Behavioral: Negative for suicidal ideas, confusion and sleep disturbance.        Objective:   Physical Exam  Constitutional: She appears well-developed. No distress.  HENT:  Head: Normocephalic.  Right Ear: External ear normal.  Left Ear: External ear normal.  Nose: Nose normal.  Mouth/Throat: Oropharynx is clear and moist.  Eyes: Conjunctivae are normal. Pupils are equal, round, and reactive to light. Right eye exhibits no discharge. Left eye exhibits no discharge.  Neck: Normal range of motion. Neck supple. No JVD present. No tracheal deviation present. No  thyromegaly present.  Cardiovascular: Normal rate, regular rhythm and normal heart sounds.   Pulmonary/Chest: No stridor. No respiratory distress. She has no wheezes.  Abdominal: Soft. Bowel sounds are normal. She exhibits no distension and no mass. There is no tenderness. There is no rebound and no guarding.  Musculoskeletal: She exhibits no edema or tenderness.  Lymphadenopathy:    She has no cervical adenopathy.  Neurological: She displays normal reflexes. No cranial nerve deficit. She exhibits normal muscle tone. Coordination normal.  Skin: No rash noted. No erythema.  Psychiatric: She has a normal mood and affect. Her behavior is normal. Judgment and thought content normal.  L foot and L calf is swollen, tender  Lab Results  Component Value Date   WBC 7.1 03/29/2014   HGB 11.0* 03/29/2014   HCT 34.9 03/29/2014   PLT 246 03/29/2014   GLUCOSE 109* 06/14/2014   CHOL 240* 11/17/2013   TRIG 269.0* 11/17/2013   HDL 32.30* 11/17/2013   LDLDIRECT 147.8 05/08/2013   LDLCALC 154* 11/17/2013   ALT 12 03/20/2014   AST 23 03/20/2014   NA 137 06/14/2014   K 4.4 06/14/2014   CL 107 06/14/2014   CREATININE 1.73* 06/14/2014   BUN 27* 06/14/2014   CO2 23 06/14/2014   TSH 3.06 11/17/2013   INR 1.8 06/20/2014   HGBA1C 5.5 03/20/2014   MICROALBUR 3.1* 09/27/2006         Assessment & Plan:

## 2014-06-23 NOTE — Assessment & Plan Note (Signed)
Continue with current prescription therapy as reflected on the Med list. Labs  

## 2014-06-23 NOTE — Assessment & Plan Note (Signed)
Continue with current prescription therapy as reflected on the Med list.  

## 2014-06-28 ENCOUNTER — Telehealth: Payer: Self-pay | Admitting: Oncology

## 2014-06-28 ENCOUNTER — Ambulatory Visit (HOSPITAL_BASED_OUTPATIENT_CLINIC_OR_DEPARTMENT_OTHER): Payer: Medicare Other | Admitting: Oncology

## 2014-06-28 ENCOUNTER — Other Ambulatory Visit (HOSPITAL_BASED_OUTPATIENT_CLINIC_OR_DEPARTMENT_OTHER): Payer: Medicare Other

## 2014-06-28 VITALS — BP 149/52 | HR 52 | Temp 97.7°F | Resp 18 | Ht 64.0 in | Wt 212.2 lb

## 2014-06-28 DIAGNOSIS — I82402 Acute embolism and thrombosis of unspecified deep veins of left lower extremity: Secondary | ICD-10-CM

## 2014-06-28 DIAGNOSIS — Z853 Personal history of malignant neoplasm of breast: Secondary | ICD-10-CM

## 2014-06-28 DIAGNOSIS — D649 Anemia, unspecified: Secondary | ICD-10-CM

## 2014-06-28 LAB — CBC WITH DIFFERENTIAL/PLATELET
BASO%: 1.8 % (ref 0.0–2.0)
Basophils Absolute: 0.1 10*3/uL (ref 0.0–0.1)
EOS%: 5.6 % (ref 0.0–7.0)
Eosinophils Absolute: 0.4 10*3/uL (ref 0.0–0.5)
HEMATOCRIT: 37.2 % (ref 34.8–46.6)
HEMOGLOBIN: 12 g/dL (ref 11.6–15.9)
LYMPH#: 0.7 10*3/uL — AB (ref 0.9–3.3)
LYMPH%: 8.5 % — ABNORMAL LOW (ref 14.0–49.7)
MCH: 28.4 pg (ref 25.1–34.0)
MCHC: 32.3 g/dL (ref 31.5–36.0)
MCV: 88.2 fL (ref 79.5–101.0)
MONO#: 0.6 10*3/uL (ref 0.1–0.9)
MONO%: 6.9 % (ref 0.0–14.0)
NEUT#: 6.1 10*3/uL (ref 1.5–6.5)
NEUT%: 77.2 % — ABNORMAL HIGH (ref 38.4–76.8)
Platelets: 236 10*3/uL (ref 145–400)
RBC: 4.22 10*6/uL (ref 3.70–5.45)
RDW: 15.3 % — ABNORMAL HIGH (ref 11.2–14.5)
WBC: 7.9 10*3/uL (ref 3.9–10.3)
nRBC: 0 % (ref 0–0)

## 2014-06-28 NOTE — Telephone Encounter (Signed)
gv and printed appt sched and avs for pt for NOV °

## 2014-06-28 NOTE — Progress Notes (Signed)
  [place] OFFICE PROGRESS NOTE   Diagnosis: breast cancer, anemia  INTERVAL HISTORY:   Wendy Cole returns as scheduled.she completed Arimidex in October 2015. A left mammogram was negativeon 03/13/2014. She continues Coumadin anticoagulation. No symptom of recurrent venous thrombosis. No bleeding.  Objective:  Vital signs in last 24 hours:  Blood pressure 149/52, pulse 52, temperature 97.7 F (36.5 C), temperature source Oral, resp. rate 18, height $RemoveBe'5\' 4"'vDwhBmPsx$  (1.626 m), weight 212 lb 3.2 oz (96.253 kg), SpO2 96 %.    HEENT: neck without mass Lymphatics: no cervical, supra-clavicular, or axillary nodes Resp: lungs clear bilaterally Cardio: regular rate and rhythm, 2/6 systolic murmur GI: no hepatomegaly Vascular: no leg edema Breast: Status post right mastectomy, no evidence for chest wall tumor recurrence. Left breast without mass   Lab Results:  Lab Results  Component Value Date   WBC 7.9 06/28/2014   HGB 12.0 06/28/2014   HCT 37.2 06/28/2014   MCV 88.2 06/28/2014   PLT 236 06/28/2014   NEUTROABS 6.1 06/28/2014     Medications: I have reviewed the patient's current medications.  Assessment/Plan: 1. Extensive left lower extremity deep vein thrombosis. Maintained on anticoagulation therapy since she was diagnosed with a deep venous thrombosis, 01/31/2009.  1. She was treated with Lovenox followed by heparin anticoagulation while hospitalized, and she was then maintained on Lovenox prior to surgery on 03/02/2009.  2. Now maintained on therapeutic Coumadin anticoagulation.  2. Diagnosed with synchronous T1, ER positive, PR positive, and HER-2 negative right-sided breast cancer on 01/09/2009. 1. Status post a right mastectomy and sentinel lymph node biopsy on 03/22/2009 with the pathology confirming two foci of invasive carcinoma measuring 0.6 and 0.4 cm. The invasive tumor was grade 1 with associated low-grade ductal carcinoma in situ. No  lymphovascular invasion was identified, and three right-sided sentinel lymph nodes were negative for metastatic carcinoma.  2. Initiation of adjuvant Arimidex on 04/05/2009.completed at the end of October 2015. 3. Chronic obstructive pulmonary disease. 4. History of a cerebrovascular accident. 5. Gout. 6. Obstructive sleep apnea. 7. History of renal insufficiency. 8. Anemia-severe microcytic anemia noted on 07/20/2012. Question secondary to iron deficiency or renal insufficiency. Stool Hemoccult positive x3 on 06/21/2013.the hemoglobin is now normal.she continues iron. 9. Admission with "flash "pulmonary edema in February of 2013. 10. Left carotid endarterectomy June 2013, right carotid endarterectomy August 2013    Disposition:  She remains in clinical remission from breast cancer. She will schedule a left mammogram for October 2016.  The hemoglobin remains in the normal range. She will return for an office visit and CBC in 9 months.  Betsy Coder, MD  06/28/2014  3:24 PM

## 2014-07-04 ENCOUNTER — Ambulatory Visit (INDEPENDENT_AMBULATORY_CARE_PROVIDER_SITE_OTHER): Payer: Medicare Other | Admitting: General Practice

## 2014-07-04 DIAGNOSIS — Z5181 Encounter for therapeutic drug level monitoring: Secondary | ICD-10-CM

## 2014-07-04 DIAGNOSIS — Z86718 Personal history of other venous thrombosis and embolism: Secondary | ICD-10-CM

## 2014-07-04 DIAGNOSIS — Z7901 Long term (current) use of anticoagulants: Secondary | ICD-10-CM

## 2014-07-04 DIAGNOSIS — I82402 Acute embolism and thrombosis of unspecified deep veins of left lower extremity: Secondary | ICD-10-CM

## 2014-07-04 DIAGNOSIS — I82509 Chronic embolism and thrombosis of unspecified deep veins of unspecified lower extremity: Secondary | ICD-10-CM

## 2014-07-04 LAB — POCT INR: INR: 2.5

## 2014-07-04 NOTE — Progress Notes (Signed)
Pre visit review using our clinic review tool, if applicable. No additional management support is needed unless otherwise documented below in the visit note. 

## 2014-07-04 NOTE — Progress Notes (Signed)
Agree with plan 

## 2014-07-16 ENCOUNTER — Encounter: Payer: Self-pay | Admitting: Internal Medicine

## 2014-07-16 ENCOUNTER — Ambulatory Visit (INDEPENDENT_AMBULATORY_CARE_PROVIDER_SITE_OTHER): Payer: Medicare Other | Admitting: Internal Medicine

## 2014-07-16 VITALS — BP 138/78 | HR 52 | Ht 64.0 in | Wt 217.0 lb

## 2014-07-16 DIAGNOSIS — J9601 Acute respiratory failure with hypoxia: Secondary | ICD-10-CM

## 2014-07-16 DIAGNOSIS — Z9989 Dependence on other enabling machines and devices: Principal | ICD-10-CM

## 2014-07-16 DIAGNOSIS — G4733 Obstructive sleep apnea (adult) (pediatric): Secondary | ICD-10-CM

## 2014-07-16 DIAGNOSIS — J449 Chronic obstructive pulmonary disease, unspecified: Secondary | ICD-10-CM

## 2014-07-16 DIAGNOSIS — I6523 Occlusion and stenosis of bilateral carotid arteries: Secondary | ICD-10-CM

## 2014-07-16 NOTE — Progress Notes (Signed)
Subjective:    [name], female    DOB: 07/10/1935, 79 y.o.   MRN: [id number]  HPI 10/17/10- 34 yoF former smoker followed for OSA and COPD, complicated by DM, hx breast cancer, hx DVT. Here with husband. Last here Oct 17, 2009- note reviewed.  She denies any significant breathing issues since last here. Continues to use CPAP at 10 all night every night. Never recurrence of DVT left leg.  Noted minor cough when first lying down- they are not concerned.   10/22/11- 79 yoF former smoker followed for OSA and COPD, complicated by DM, hx breast cancer, hx DVT.... Here with husband. Denies any SOB, wheezing, cough, or congestion. Wears CPAP every night for 5-6 hours approx. Stable dyspnea on exertion with hills and stairs. Hospitalized from February 18 through 26 with pulmonary edema, history AFib, chronic Coumadin, history of DVT. Ejection fraction 25-30%.  01/29/12- 74 yoF former smoker followed for OSA and COPD, complicated by DM, hx breast cancer, hx DVT.... Here with husband. ACUTE VISIT: Increased SOB and worse at night; get raspy breathing prior to SOB starting up. This episode made her feel like her stomach was going to pop. Hospital 8/20 through 01/13/2012-right carotid endarterectomy/Dr. Bridgett Larsson. Occasional episodes all summer described as onset of shortness of breath in the evening after dinner when she is reading or watching TV or it she starts clearing her throat and feels bloated in the abdomen which makes her more short of breath. Worse if lying down. Denies fever, purulent discharge, palpitation or chest pain. 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 CPAP all night every night. She has been mis- using Advair, saving it for her episodes of dyspnea, so she uses it once at night with 2 or 3 activations. I reeducated her on use as a maintenance inhaler.  03/15/12- 79 yoF former smoker followed for OSA and  COPD, complicated by DM, hx breast cancer, hx DVT.... Here with husband.  Pt states that SOB has improved since last OV. No complaints She feels well and is walking more. Recognizes fluid retention causes shortness of breath. Her diuretic therapy helps. Pleased with CPAP 10/Advanced used all night every night. CXR 02/02/12-reviewed with her IMPRESSION:  Cardiomegaly. COPD/chronic changes. No active disease.  Original Report Authenticated By: Raelyn Number, M.D.   09/13/12- 79 yoF former smoker followed for OSA and COPD, complicated by DM, hx breast cancer, hx DVT.... Here with husband. FOLLOWS FOR:SOB at times and feelings of not being able to breathe-denies this happens with activity; states she goes to bed reading and about 30 minutes later she cant breathe and feels congestion. CPAP 10/ Advanced Is positional dyspnea happens about one time a month and is relieved by sitting up and waiting for it to go away.intends to start before she puts her CPAP on. She has been using either Dulera or Advair intermittently as rescue inhalers which we discussed. She emphasizes that she gets acutely short of breath and will grab for anything. She wasn't satisfied to discuss the difference between maintenance and rescue inhalers. She is being worked up for anemia. Glaucoma is treated with Timoptic which may cause bronchospasm. History of diastolic heart failure followed by cardiology. CXR 05/03/12 IMPRESSION:  Cardiac enlargement with pulmonary vascular congestion and  interstitial edema similar to previous study.  Original Report Authenticated By: Lucienne Capers, M.D.  11/14/12-  79 yoF former smoker followed for OSA and COPD, complicated by  DM, hx breast cancer, hx DVT, hx anemia.... Here with husband. FOLLWS FOR: review PFT with patient; Occasional SOB at times. Continues CPAP 10/ [facility] Reports little cough. BNP 09/13/12- 458, down from 3006 PFT- 11/14/2012-normal spirometry flows without response to  bronchodilator, normal lung volumes, diffusion moderately reduced/55% predicted CXR 09/13/12 IMPRESSION:  Enlargement of cardiac silhouette with pulmonary vascular  congestion.  Minimal chronic bronchitic changes with left basilar atelectasis.  Original Report Authenticated By: Lavonia Dana, M.D.  05/15/13- 79 yoF former smoker followed for OSA and COPD, complicated by DM, hx breast cancer, hx DVT, hx anemia.... Here with husband. FOLLOWS FOR: SOB and wheezing at times(happens all of a sudden); Has had to use Advair  If upset or nervous her breathing gets raspy and short of breath. She sits quietly and takes chlorazepate her breathing becomes comfortable again. Uses Proventil and Advair interchangeably as rescue inhalers-educated on this. Occasional prednisone, about 3 days a month, for arthralgias CPAP 10/Advanced good compliance and control  11/13/13- 79 yoF former smoker followed for OSA and COPD, complicated by DM, hx breast cancer, hx DVT, hx anemia.... Here with husband. FOLLOWS FOR:  Breathing is unchanged since last OV, has good and bad days with weather.  Wearing CPAP 10/ Advanced 8 hours per night She feels CPAP is doing well. Sleeps well. Occasional shortness of breath mostly with exertion. Prednisone 3 days per week for arthritis  05/15/14-78 yoF former smoker followed for OSA and COPD, complicated by DM, hx breast cancer, hx DVT, hx anemia.... Here with husband FOLLOW FOR: gets short of breath walking from one end to the other end of the house; she has a habit of holding her breath and has been trying to deliberately breath as she is moving about.  Weather has been causing her a lot of trouble breathing.   CPAP 10/ Advanced- won't sleep w/o. Persistent dyspnea on exertion 6 months, not progressive. Some better days. Occasionally little wheeze but not much cough, no chest pain. Occasional palpitation. Feet swell a little. Echocardiogram 03/01/2014-PAS 69, mild RAE, severe LAE Walk  test today on room air: Resting saturation 91-92%. Desaturated walking to 86% on room air Diffusion moderately reduced with normal spirometry flows on PFT - 2014.  07/16/14-78 yoF former smoker followed for OSA and COPD, complicated by DM, hx breast cancer, hx DVT, hx anemia.... Here with husband CPAP 10/ Advanced all night every night Follow up: SOB on exertion; no cough; sleeps approx 7hrs nightly on CPAP. She is doing well with CPAP. Desaturated last visit on walk test. D-dimer was elevated 1.11 V/Q scan done because of renal function, was low prob.  ROS-see HPI Constitutional:   No-   weight loss, night sweats, fevers, chills, fatigue, lassitude. HEENT:   No-  headaches, difficulty swallowing, tooth/dental problems, sore throat,       No-  sneezing, itching, ear ache, nasal congestion, post nasal drip,  CV:  No-   chest pain, +orthopnea, +PND, swelling in lower extremities, anasarca, dizziness, palpitations Resp: +shortness of breath with exertion or at rest.              No-   productive cough,  No non-productive cough,  No- coughing up of blood.              No-   change in color of mucus.  No- wheezing.   Skin: No-   rash or lesions. GI:  No-   heartburn, indigestion, abdominal pain, nausea, vomiting,  GU:  MS:  +  joint pain or swelling.   Neuro-     nothing unusual Psych:  No- change in mood or affect. No depression or anxiety.  No memory loss.  OBJ- Physical Exam General- Alert, Oriented, Affect-appropriate, Distress- none acute, overweight Skin- rash-none, lesions- none, excoriation- none Lymphadenopathy- none Head- atraumatic            Eyes- Gross vision intact, PERRLA, conjunctivae and secretions clear            Ears- Hearing, canals-normal            Nose- Clear, no-Septal dev, mucus, polyps, erosion, perforation             Throat- Mallampati II , mucosa clear , drainage- none, tonsils- atrophic Neck- flexible , trachea midline, no stridor , thyroid nl, + Bilateral  carotid scars Chest - symmetrical excursion , unlabored           Heart/CV- RRR , +9/0 systolic Aortic murmur , no gallop  , no rub, nl s1 s2                           - JVD- none , edema+trace, stasis changes- none, varices- none           Lung- clear, wheeze- none, cough- none , dullness-none, rub- none           Chest wall-  Abd-  Br/ Gen/ Rectal- Not done, not indicated Extrem- cyanosis- none, clubbing, none, atrophy- none, strength- nl Neuro- grossly intact to observation

## 2014-07-16 NOTE — Patient Instructions (Signed)
Order - [place] room air on CPAP    Dx OSA, COPD   We can continue CPAP   Please call as needed

## 2014-07-22 NOTE — Assessment & Plan Note (Signed)
Compliance and control have been good. CPAP continues medically necessary.

## 2014-07-22 NOTE — Assessment & Plan Note (Signed)
She is resistant to the idea of home oxygen even for sleep, despite her history of heart disease, DVT and stroke. Plan-overnight oximetry

## 2014-07-24 ENCOUNTER — Ambulatory Visit (INDEPENDENT_AMBULATORY_CARE_PROVIDER_SITE_OTHER): Payer: Medicare Other | Admitting: General Practice

## 2014-07-24 DIAGNOSIS — I480 Paroxysmal atrial fibrillation: Secondary | ICD-10-CM

## 2014-07-24 DIAGNOSIS — I82402 Acute embolism and thrombosis of unspecified deep veins of left lower extremity: Secondary | ICD-10-CM

## 2014-07-24 DIAGNOSIS — Z86718 Personal history of other venous thrombosis and embolism: Secondary | ICD-10-CM

## 2014-07-24 DIAGNOSIS — Z7901 Long term (current) use of anticoagulants: Secondary | ICD-10-CM

## 2014-07-24 DIAGNOSIS — I82509 Chronic embolism and thrombosis of unspecified deep veins of unspecified lower extremity: Secondary | ICD-10-CM

## 2014-07-24 LAB — POCT INR: INR: 2.1

## 2014-07-24 NOTE — Progress Notes (Signed)
Agree with plan 

## 2014-07-24 NOTE — Progress Notes (Signed)
Pre visit review using our clinic review tool, if applicable. No additional management support is needed unless otherwise documented below in the visit note. 

## 2014-07-29 ENCOUNTER — Other Ambulatory Visit: Payer: Self-pay | Admitting: Internal Medicine

## 2014-07-29 DIAGNOSIS — J9601 Acute respiratory failure with hypoxia: Secondary | ICD-10-CM

## 2014-08-06 ENCOUNTER — Telehealth: Payer: Self-pay | Admitting: Internal Medicine

## 2014-08-06 NOTE — Telephone Encounter (Signed)
I called pt's husband. He states both of her big toes are discolored, red and sore x 1 week. Pt and spouse are pretty concerned. Transferred to scheduler for [place].

## 2014-08-06 NOTE — Telephone Encounter (Signed)
Pt husband called in and wanted to speak to nurse about pt toes.  She has a few questions

## 2014-08-07 ENCOUNTER — Ambulatory Visit (INDEPENDENT_AMBULATORY_CARE_PROVIDER_SITE_OTHER): Payer: Medicare Other | Admitting: Internal Medicine

## 2014-08-07 ENCOUNTER — Encounter: Payer: Self-pay | Admitting: Internal Medicine

## 2014-08-07 VITALS — BP 132/80 | HR 61 | Temp 97.8°F | Ht 64.0 in | Wt 217.0 lb

## 2014-08-07 DIAGNOSIS — L03032 Cellulitis of left toe: Secondary | ICD-10-CM

## 2014-08-07 DIAGNOSIS — I739 Peripheral vascular disease, unspecified: Secondary | ICD-10-CM

## 2014-08-07 DIAGNOSIS — I6523 Occlusion and stenosis of bilateral carotid arteries: Secondary | ICD-10-CM

## 2014-08-07 MED ORDER — MUPIROCIN 2 % EX OINT: TOPICAL_OINTMENT | CUTANEOUS | Status: DC

## 2014-08-07 MED ORDER — SULFAMETHOXAZOLE-TRIMETHOPRIM 800-160 MG PO TABS: 1.0000 | ORAL_TABLET | Freq: Two times a day (BID) | ORAL | Status: DC

## 2014-08-07 NOTE — Progress Notes (Signed)
   Subjective:    [name], female    DOB: 1935-12-14, 79 y.o.   MRN: [id number]  HPI She has had pain in the left great toe since 08/03/14. She's had progressive redness at the base of the nail. Pain is described as a level V and is dull to sharp. She's had some purulent drainage from the medial aspect of the nail. She receives her nail care from a commercial cosmetologist. She's been using hydrogen peroxide on this area.  She denies any history of MRSA.  There is some question of her having diabetes. A1c's on record are 5.5%. She has renal insufficiency with creatinine 1.7 and GFR 30.84.  Her last PT/INR was 2.1 on 07/24/14. Warfarin was adjusted.   Review of Systems She denies fever, chills, or sweats.  There's been no podagra type symptoms  She has edema which improves with elevation.  She has sleep apnea and uses CPAP prescribed by Dr. Annamaria Boots.  She does have occasional chest pain of an anginal type and is followed by cardiologist.  She's having no chest pain today.     Objective:   Physical Exam   Pertinent or positive findings include: She has a grade 1 systolic murmur.  Abdomen is protuberant. Pedal pulses are decreased; especially dorsalis pedis pulses.  There is erythema at the base of the left great toenail. There is an eschar medially at the superior aspect of the left great toenail. No purulence or pustules are present.  General appearance is one of adequate nourishment w/o distress. Eyes: No conjunctival inflammation or scleral icterus is present. Heart:  Normal rate and regular rhythm. S1 and S2 normal without gallop, click, rub or other extra sounds   Lungs:Chest clear to auscultation; no wheezes, rhonchi,rales ,or rubs present.No increased work of breathing.  Abdomen: bowel sounds normal, soft and non-tender without masses, organomegaly or hernias noted.  No guarding or rebound . Skin:Warm & dry;no jaundice or tenting Lymphatic: No  lymphadenopathy is noted about the head, neck, axilla              Assessment & Plan:  #1 cellulitis left great toe. Gout is not present clinically.  #2 severe peripheral vascular disease   #3 renal insufficiency  Plan: See orders recommendations

## 2014-08-07 NOTE — Progress Notes (Signed)
Pre visit review using our clinic review tool, if applicable. No additional management support is needed unless otherwise documented below in the visit note. 

## 2014-08-07 NOTE — Patient Instructions (Signed)
Dip gauze in  sterile saline and applied to the wound twice a day. Cover the wound with Telfa , non stick dressing  with antibiotic ointment. The saline can be purchased at the drugstore or you can make your own .Boil cup of salt in a gallon of water. Store mixture  in a clean container.Report Warning  signs as discussed (red streaks, pus, fever, increasing pain).

## 2014-08-08 ENCOUNTER — Other Ambulatory Visit: Payer: Self-pay | Admitting: Internal Medicine

## 2014-08-10 ENCOUNTER — Other Ambulatory Visit: Payer: Self-pay | Admitting: Internal Medicine

## 2014-08-13 ENCOUNTER — Encounter: Payer: Self-pay | Admitting: Internal Medicine

## 2014-08-14 ENCOUNTER — Encounter: Payer: Self-pay | Admitting: Internal Medicine

## 2014-08-14 ENCOUNTER — Ambulatory Visit (INDEPENDENT_AMBULATORY_CARE_PROVIDER_SITE_OTHER): Payer: Medicare Other | Admitting: Internal Medicine

## 2014-08-14 VITALS — BP 120/70 | HR 59 | Wt 219.0 lb

## 2014-08-14 DIAGNOSIS — I6523 Occlusion and stenosis of bilateral carotid arteries: Secondary | ICD-10-CM | POA: Diagnosis not present

## 2014-08-14 DIAGNOSIS — L03116 Cellulitis of left lower limb: Secondary | ICD-10-CM | POA: Diagnosis not present

## 2014-08-14 NOTE — Progress Notes (Signed)
   Subjective:    HPI   F/u LBP The patient presents for a follow-up of  chronic hypertension, chronic dyslipidemia, type 2 diabetes, h/o CHF controlled with medicines. C/o severe pain in thor back and B neck/head pain L>R; stiff - taking Aleve and Tramadol F/u occ R temporal-occip HA   Wt Readings from Last 3 Encounters:  08/14/14 219 lb (99.338 kg)  08/07/14 217 lb (98.431 kg)  07/16/14 217 lb (98.431 kg)   BP Readings from Last 3 Encounters:  08/14/14 120/70  08/07/14 132/80  07/16/14 138/78    Review of Systems  Constitutional: Positive for fatigue. Negative for chills, activity change, appetite change and unexpected weight change.  HENT: Negative for congestion, mouth sores and sinus pressure.   Eyes: Negative for visual disturbance.  Respiratory: Positive for shortness of breath. Negative for cough and chest tightness.   Cardiovascular: Negative for chest pain.  Gastrointestinal: Negative for nausea, vomiting, abdominal pain, blood in stool and abdominal distention.  Genitourinary: Negative for frequency, difficulty urinating and vaginal pain.  Musculoskeletal: Positive for myalgias, back pain, arthralgias and gait problem. Negative for joint swelling.  Skin: Negative for pallor and rash.  Neurological: Negative for dizziness, tremors, weakness, numbness and headaches.  Psychiatric/Behavioral: Negative for suicidal ideas, confusion and sleep disturbance.        Objective:   Physical Exam  Constitutional: She appears well-developed. No distress.  HENT:  Head: Normocephalic.  Right Ear: External ear normal.  Left Ear: External ear normal.  Nose: Nose normal.  Mouth/Throat: Oropharynx is clear and moist.  Eyes: Conjunctivae are normal. Pupils are equal, round, and reactive to light. Right eye exhibits no discharge. Left eye exhibits no discharge.  Neck: Normal range of motion. Neck supple. No JVD present. No tracheal deviation present. No thyromegaly present.   Cardiovascular: Normal rate, regular rhythm and normal heart sounds.   Pulmonary/Chest: No stridor. No respiratory distress. She has no wheezes.  Abdominal: Soft. Bowel sounds are normal. She exhibits no distension and no mass. There is no tenderness. There is no rebound and no guarding.  Musculoskeletal: She exhibits no edema or tenderness.  Lymphadenopathy:    She has no cervical adenopathy.  Neurological: She displays normal reflexes. No cranial nerve deficit. She exhibits normal muscle tone. Coordination normal.  Skin: No rash noted. No erythema.  Psychiatric: She has a normal mood and affect. Her behavior is normal. Judgment and thought content normal.  L big toe foot and L calf is less swollen, not tender - healing paronychia  Lab Results  Component Value Date   WBC 7.9 06/28/2014   HGB 12.0 06/28/2014   HCT 37.2 06/28/2014   PLT 236 06/28/2014   GLUCOSE 109* 06/14/2014   CHOL 240* 11/17/2013   TRIG 269.0* 11/17/2013   HDL 32.30* 11/17/2013   LDLDIRECT 147.8 05/08/2013   LDLCALC 154* 11/17/2013   ALT 12 03/20/2014   AST 23 03/20/2014   NA 137 06/14/2014   K 4.4 06/14/2014   CL 107 06/14/2014   CREATININE 1.73* 06/14/2014   BUN 27* 06/14/2014   CO2 23 06/14/2014   TSH 3.06 11/17/2013   INR 2.1 07/24/2014   HGBA1C 5.5 03/20/2014   MICROALBUR 3.1* 09/27/2006         Assessment & Plan:

## 2014-08-14 NOTE — Assessment & Plan Note (Addendum)
3/16 big toe Paronychia  Finish antibiotic Cont soaks, Bactroban dressing

## 2014-08-14 NOTE — Patient Instructions (Signed)
Cellulitis of the big toe Paronychia  Ingrown toenail  Wide shoes

## 2014-08-14 NOTE — Progress Notes (Signed)
Pre visit review using our clinic review tool, if applicable. No additional management support is needed unless otherwise documented below in the visit note. 

## 2014-08-21 ENCOUNTER — Other Ambulatory Visit (INDEPENDENT_AMBULATORY_CARE_PROVIDER_SITE_OTHER): Payer: Medicare Other | Admitting: *Deleted

## 2014-08-21 ENCOUNTER — Ambulatory Visit: Payer: Medicare Other

## 2014-08-21 DIAGNOSIS — I509 Heart failure, unspecified: Secondary | ICD-10-CM | POA: Diagnosis not present

## 2014-08-21 DIAGNOSIS — R0602 Shortness of breath: Secondary | ICD-10-CM

## 2014-08-21 DIAGNOSIS — Z79899 Other long term (current) drug therapy: Secondary | ICD-10-CM | POA: Diagnosis not present

## 2014-08-21 LAB — BASIC METABOLIC PANEL
BUN: 31 mg/dL — ABNORMAL HIGH (ref 6–23)
CHLORIDE: 109 meq/L (ref 96–112)
CO2: 23 mEq/L (ref 19–32)
CREATININE: 1.89 mg/dL — AB (ref 0.40–1.20)
Calcium: 9 mg/dL (ref 8.4–10.5)
GFR: 27.45 mL/min — AB (ref >60.00)
GLUCOSE: 93 mg/dL (ref 70–99)
POTASSIUM: 5.2 meq/L — AB (ref 3.5–5.1)
Sodium: 137 mEq/L (ref 135–145)

## 2014-08-21 LAB — BRAIN NATRIURETIC PEPTIDE: PRO B NATRI PEPTIDE: 1437 pg/mL — AB (ref 0.0–100.0)

## 2014-08-22 ENCOUNTER — Ambulatory Visit (INDEPENDENT_AMBULATORY_CARE_PROVIDER_SITE_OTHER): Payer: Medicare Other | Admitting: General Practice

## 2014-08-22 DIAGNOSIS — Z7901 Long term (current) use of anticoagulants: Secondary | ICD-10-CM | POA: Diagnosis not present

## 2014-08-22 DIAGNOSIS — Z86718 Personal history of other venous thrombosis and embolism: Secondary | ICD-10-CM

## 2014-08-22 DIAGNOSIS — I82509 Chronic embolism and thrombosis of unspecified deep veins of unspecified lower extremity: Secondary | ICD-10-CM

## 2014-08-22 DIAGNOSIS — I82402 Acute embolism and thrombosis of unspecified deep veins of left lower extremity: Secondary | ICD-10-CM

## 2014-08-22 DIAGNOSIS — I480 Paroxysmal atrial fibrillation: Secondary | ICD-10-CM | POA: Diagnosis not present

## 2014-08-22 LAB — POCT INR: INR: 2

## 2014-08-22 NOTE — Progress Notes (Signed)
Pre visit review using our clinic review tool, if applicable. No additional management support is needed unless otherwise documented below in the visit note. 

## 2014-08-22 NOTE — Progress Notes (Signed)
Agree with plan 

## 2014-08-31 ENCOUNTER — Ambulatory Visit (INDEPENDENT_AMBULATORY_CARE_PROVIDER_SITE_OTHER): Payer: Medicare Other | Admitting: Cardiovascular Disease

## 2014-08-31 ENCOUNTER — Other Ambulatory Visit: Payer: Self-pay | Admitting: *Deleted

## 2014-08-31 ENCOUNTER — Encounter: Payer: Self-pay | Admitting: Cardiovascular Disease

## 2014-08-31 VITALS — BP 170/72 | HR 57 | Ht 64.0 in | Wt 223.4 lb

## 2014-08-31 DIAGNOSIS — Z79899 Other long term (current) drug therapy: Secondary | ICD-10-CM

## 2014-08-31 DIAGNOSIS — I350 Nonrheumatic aortic (valve) stenosis: Secondary | ICD-10-CM

## 2014-08-31 DIAGNOSIS — I6523 Occlusion and stenosis of bilateral carotid arteries: Secondary | ICD-10-CM | POA: Diagnosis not present

## 2014-08-31 DIAGNOSIS — H539 Unspecified visual disturbance: Secondary | ICD-10-CM

## 2014-08-31 LAB — BASIC METABOLIC PANEL
BUN: 28 mg/dL — AB (ref 6–23)
CO2: 23 meq/L (ref 19–32)
Calcium: 9.1 mg/dL (ref 8.4–10.5)
Chloride: 109 mEq/L (ref 96–112)
Creatinine, Ser: 1.68 mg/dL — ABNORMAL HIGH (ref 0.40–1.20)
GFR: 31.44 mL/min — ABNORMAL LOW (ref >60.00)
GLUCOSE: 96 mg/dL (ref 70–99)
POTASSIUM: 4.8 meq/L (ref 3.5–5.1)
SODIUM: 137 meq/L (ref 135–145)

## 2014-08-31 LAB — SEDIMENTATION RATE: SED RATE: 19 mm/h (ref 0–22)

## 2014-08-31 NOTE — Progress Notes (Signed)
Patient ID: EMMAROSE KLINKE, female   DOB: 1935/06/04, 79 y.o.   MRN: [id number] Ms Sobalvarro is a 79 y.o. female with a history of heart failure NICM, PAF, OSA, HTN, prior DVT, renal insufficiency . Carotid duplex on 11/10/11 showed bilateral 80-99% ICA stenosis and she is post bilateral CEA;s She is normally seen by Dr Martin[place] and saw Dr Harrington Challenger in consultation at the hospital 11/18/11   for post op hypertension. She had a heart cath 07/13/11 One hospitalization 2013 for "flash pulmonary edema"    Echo 2/13 reviewed  Study Conclusions  - Left ventricle: Systolic function was severely reduced. The estimated ejection fraction was in the range of 25% to 30%. Features are consistent with a pseudonormal left ventricular filling pattern, with concomitant abnormal relaxation and increased filling pressure (grade 2 diastolic dysfunction). Doppler parameters are consistent with elevated mean left atrial filling pressure. - Regional wall motion abnormality: Akinesis of the basal-mid anteroseptal myocardium; hypokinesis of the entire anterior, mid inferolateral, basal-mid anterolateral, and apical lateral myocardium. - Aortic valve: Mild regurgitation. - Mitral valve: Mild regurgitation. - Left atrium: The atrium was moderately dilated. - Right atrium: The atrium was mildly dilated. - Pulmonary arteries: PA peak pressure: 78m Hg (S). Impressions:  - The right ventricular systolic pressure was increased consistent with mild pulmonary hypertension.  Echo 9/14 looked much better EF 55-60% But now with some degree of AS Mean gradient 22 and peak 31 mmHG   She is followed by Dr SPenni Homansfor anemia and breast cancer off arimidex Sees Dr YAnnamaria Bootsfor COPD OSA and previous smoker  Concerned about temporal arteritis  Family history of such and said ENT indicated diagnosis 2 years ago  Has headaches and blurry vision   Reviewed duplex 05/2014 previous CEA sights widely patent   ROS: Denies fever, malais,  weight loss, blurry vision, decreased visual acuity, cough, sputum, SOB, hemoptysis, pleuritic pain, palpitaitons, heartburn, abdominal pain, melena, lower extremity edema, claudication, or rash.  All other systems reviewed and negative  General: Affect appropriate Overweight white female  HEENT: normal Neck supple with no adenopathy JVP normal left  bruits no thyromegaly Lungs clear with no wheezing and good diaphragmatic motion Heart:  S1/S2 AS  murmur, no rub, gallop or click PMI normal Abdomen: benighn, BS positve, no tenderness, no AAA no bruit.  No HSM or HJR Distal pulses intact with no bruits Trace bilateral  edema Neuro non-focal Skin warm and dry No muscular weakness   Current Outpatient Prescriptions  Medication Sig Dispense Refill  . ADVAIR DISKUS 100-50 MCG/DOSE AEPB INHALE 1 PUFF INTO THE LUNGS EVERY 12 HOURS 60 each 5  . albuterol (PROVENTIL HFA;VENTOLIN HFA) 108 (90 BASE) MCG/ACT inhaler Inhale 2 puffs into the lungs every 6 (six) hours as needed for wheezing or shortness of breath. 1 Inhaler 6  . allopurinol (ZYLOPRIM) 100 MG tablet TAKE 1 TABLET three times a week    . brimonidine (ALPHAGAN) 0.15 % ophthalmic solution Place 1 drop into both eyes daily.     .Marland KitchenbuPROPion (WELLBUTRIN SR) 150 MG 12 hr tablet every morning.   3  . calcium carbonate (CALCIUM 500) 1250 MG tablet Take 1 tablet by mouth. Three days a week    . carvedilol (COREG) 12.5 MG tablet TAKING 2 TABLETS IN THE MORNING AND 1 AT NIGHT 270 tablet 3  . clorazepate (TRANXENE) 7.5 MG tablet TAKE 1 TABLET BY MOUTH TWICE A DAY AS NEEDED FOR ANXIETY 60 tablet 5  . ferrous sulfate 325 (65  FE) MG EC tablet Take 325 mg by mouth daily.    . hydrALAZINE (APRESOLINE) 25 MG tablet TAKE 1 TABLET (25 MG TOTAL) BY MOUTH 3 (THREE) TIMES DAILY. 90 tablet 5  . isosorbide mononitrate (IMDUR) 60 MG 24 hr tablet TAKE 1 TABLET (60 MG TOTAL) BY MOUTH DAILY. 30 tablet 1  . levothyroxine (SYNTHROID, LEVOTHROID) 137 MCG tablet Take  1 tablet (137 mcg total) by mouth daily after breakfast. 90 tablet 3  . mupirocin ointment (BACTROBAN) 2 % Applied twice a day to the affected area;NOT into eyes. 15 g 0  . omeprazole (PRILOSEC) 40 MG capsule Take 1 capsule (40 mg total) by mouth daily. 30 capsule 11  . prednisoLONE acetate (PRED FORTE) 1 % ophthalmic suspension Place 1 drop into both eyes daily as needed. For eye pain    . predniSONE (DELTASONE) 5 MG tablet Take 5 mg by mouth daily as needed. Takes up to 3/ week.    . sulfamethoxazole-trimethoprim (BACTRIM DS,SEPTRA DS) 800-160 MG per tablet Take 1 tablet by mouth 2 (two) times daily. 14 tablet 0  . timolol (TIMOPTIC) 0.5 % ophthalmic solution Place 1 drop into both eyes 2 (two) times daily. Twice daily    . torsemide (DEMADEX) 20 MG tablet TAKE  1 TAB  EVERY OTHER  DAY (Patient taking differently: Take 20 mg by mouth daily. TAKE  1 TAB  EVERY OTHER  DAY) 15 tablet 6  . traMADol (ULTRAM) 50 MG tablet TAKE 1-2 TABLETS BY MOUTH 2 TIMES DAILY AS NEEDED FOR MODERATE OR SEVERE PAIN 50 tablet 0  . triamcinolone cream (KENALOG) 0.1 % Apply 1 application topically 2 (two) times daily. 90 g 3  . vitamin D, CHOLECALCIFEROL, 400 UNITS tablet Take 400 Units by mouth. Three days a week    . warfarin (COUMADIN) 1 MG tablet TAKE 2 TABLETS MON AND THURS AND TAKE 3 TABLETS ON ALL OTHER DAYS 76 tablet 4  . warfarin (COUMADIN) 3 MG tablet TAKE 1 TABLET (3 MG TOTAL) BY MOUTH DAILY OR AS DIRECTED BY PHYSICIAN. 30 tablet 1   No current facility-administered medications for this visit.    Allergies  Amlodipine; Atorvastatin; Colesevelam; Lasix; Statins; and Tape  Electrocardiogram: 2013  SR possible old IMI rate60  01/2014  SB rate 6 LVH LAD   Assessment and Plan CAD:  Stable with no angina and good activity level.  Continue medical Rx AS:  Mild on exam f/u echo last one done 2014 mild Headache:  ? Temporal arteritis  F/u BMET , ESR and CTA neck and cerebral vessels she only takes low dose  prednisone 3x/week Carotids:  F/u duplex September no restenosis post CEA bilateral ASA

## 2014-08-31 NOTE — Patient Instructions (Addendum)
Your physician wants you to follow-up in:   Wendy Cole will receive a reminder letter in the mail two months in advance. If you don't receive a letter, please call our office to schedule the follow-up appointment.  Your physician recommends that you continue on your current medications as directed. Please refer to the Current Medication list given to you today  . Your physician recommends that you return for lab work in: Wendy Cole has requested that you have an echocardiogram. Echocardiography is a painless test that uses sound waves to create images of your heart. It provides your doctor with information about the size and shape of your heart and how well your heart's chambers and valves are working. This procedure takes approximately one hour. There are no restrictions for this procedure.   CTA  CAROTIDS

## 2014-09-06 ENCOUNTER — Other Ambulatory Visit: Payer: Self-pay | Admitting: *Deleted

## 2014-09-06 DIAGNOSIS — R51 Headache: Principal | ICD-10-CM

## 2014-09-06 DIAGNOSIS — I6529 Occlusion and stenosis of unspecified carotid artery: Secondary | ICD-10-CM

## 2014-09-06 DIAGNOSIS — G459 Transient cerebral ischemic attack, unspecified: Secondary | ICD-10-CM

## 2014-09-06 DIAGNOSIS — R519 Headache, unspecified: Secondary | ICD-10-CM

## 2014-09-07 ENCOUNTER — Ambulatory Visit (HOSPITAL_COMMUNITY): Payer: Medicare Other | Attending: Cardiovascular Disease

## 2014-09-07 DIAGNOSIS — I350 Nonrheumatic aortic (valve) stenosis: Secondary | ICD-10-CM | POA: Diagnosis present

## 2014-09-07 NOTE — Progress Notes (Signed)
2D Echo completed. 09/07/2014

## 2014-09-12 ENCOUNTER — Telehealth: Payer: Self-pay | Admitting: *Deleted

## 2014-09-12 NOTE — Telephone Encounter (Signed)
LM TO CALL BACK ./CY 

## 2014-09-12 NOTE — Telephone Encounter (Signed)
Moderate AS/AR Normal EF F/u echo in a year     ----- Message -----     From: Baxter Flattery     Sent: 09/10/2014  2:56 PM      To: Josue Hector, MD

## 2014-09-13 ENCOUNTER — Other Ambulatory Visit: Payer: Self-pay | Admitting: Internal Medicine

## 2014-09-13 ENCOUNTER — Other Ambulatory Visit: Payer: Self-pay | Admitting: *Deleted

## 2014-09-13 DIAGNOSIS — J9601 Acute respiratory failure with hypoxia: Secondary | ICD-10-CM

## 2014-09-13 MED ORDER — FLUTICASONE-SALMETEROL 100-50 MCG/DOSE IN AEPB: 1.0000 | INHALATION_SPRAY | Freq: Two times a day (BID) | RESPIRATORY_TRACT | Status: DC

## 2014-09-14 ENCOUNTER — Other Ambulatory Visit: Payer: Self-pay | Admitting: *Deleted

## 2014-09-14 MED ORDER — HYDRALAZINE HCL 25 MG PO TABS: ORAL_TABLET | ORAL | Status: DC

## 2014-09-18 NOTE — Telephone Encounter (Signed)
PT AWARE OF ECHO RESULTS./CY 

## 2014-09-19 ENCOUNTER — Ambulatory Visit: Payer: Medicare Other

## 2014-09-21 ENCOUNTER — Ambulatory Visit (INDEPENDENT_AMBULATORY_CARE_PROVIDER_SITE_OTHER): Payer: Medicare Other | Admitting: Internal Medicine

## 2014-09-21 ENCOUNTER — Encounter: Payer: Self-pay | Admitting: Internal Medicine

## 2014-09-21 VITALS — BP 139/84 | HR 60 | Wt 224.0 lb

## 2014-09-21 DIAGNOSIS — Z23 Encounter for immunization: Secondary | ICD-10-CM | POA: Diagnosis not present

## 2014-09-21 DIAGNOSIS — R609 Edema, unspecified: Secondary | ICD-10-CM | POA: Insufficient documentation

## 2014-09-21 DIAGNOSIS — I6523 Occlusion and stenosis of bilateral carotid arteries: Secondary | ICD-10-CM | POA: Diagnosis not present

## 2014-09-21 DIAGNOSIS — M5440 Lumbago with sciatica, unspecified side: Secondary | ICD-10-CM

## 2014-09-21 DIAGNOSIS — E669 Obesity, unspecified: Secondary | ICD-10-CM | POA: Diagnosis not present

## 2014-09-21 DIAGNOSIS — R600 Localized edema: Secondary | ICD-10-CM

## 2014-09-21 DIAGNOSIS — Z86718 Personal history of other venous thrombosis and embolism: Secondary | ICD-10-CM

## 2014-09-21 DIAGNOSIS — M1000 Idiopathic gout, unspecified site: Secondary | ICD-10-CM

## 2014-09-21 DIAGNOSIS — E119 Type 2 diabetes mellitus without complications: Secondary | ICD-10-CM

## 2014-09-21 NOTE — Assessment & Plan Note (Signed)
Tramadol prn ° Potential benefits of a long term opioids use as well as potential risks (i.e. addiction risk, apnea etc) and complications (i.e. Somnolence, constipation and others) were explained to the patient and were aknowledged. ° ° °

## 2014-09-21 NOTE — Progress Notes (Signed)
   Subjective:    HPI   F/u LBP  The patient presents for a follow-up of  chronic hypertension, chronic dyslipidemia, type 2 diabetes, h/o CHF controlled with medicines.   F/u   pain in thor back and B neck/head pain L>R; stiff - taking Aleve and Tramadol  F/u occ R temporal-occip HA   Wt Readings from Last 3 Encounters:  09/21/14 224 lb (101.606 kg)  08/31/14 223 lb 6.4 oz (101.334 kg)  08/14/14 219 lb (99.338 kg)   BP Readings from Last 3 Encounters:  09/21/14 160/84  08/31/14 170/72  08/14/14 120/70    Review of Systems  Constitutional: Positive for fatigue. Negative for chills, activity change, appetite change and unexpected weight change.  HENT: Negative for congestion, mouth sores and sinus pressure.   Eyes: Negative for visual disturbance.  Respiratory: Positive for shortness of breath. Negative for cough and chest tightness.   Cardiovascular: Negative for chest pain.  Gastrointestinal: Negative for nausea, vomiting, abdominal pain, blood in stool and abdominal distention.  Genitourinary: Negative for frequency, difficulty urinating and vaginal pain.  Musculoskeletal: Positive for myalgias, back pain, arthralgias and gait problem. Negative for joint swelling.  Skin: Negative for pallor and rash.  Neurological: Negative for dizziness, tremors, weakness, numbness and headaches.  Psychiatric/Behavioral: Negative for suicidal ideas, confusion and sleep disturbance.        Objective:   Physical Exam  Constitutional: She appears well-developed. No distress.  HENT:  Head: Normocephalic.  Right Ear: External ear normal.  Left Ear: External ear normal.  Nose: Nose normal.  Mouth/Throat: Oropharynx is clear and moist.  Eyes: Conjunctivae are normal. Pupils are equal, round, and reactive to light. Right eye exhibits no discharge. Left eye exhibits no discharge.  Neck: Normal range of motion. Neck supple. No JVD present. No tracheal deviation present. No thyromegaly  present.  Cardiovascular: Normal rate, regular rhythm and normal heart sounds.   Pulmonary/Chest: No stridor. No respiratory distress. She has no wheezes.  Abdominal: Soft. Bowel sounds are normal. She exhibits no distension and no mass. There is no tenderness. There is no rebound and no guarding.  Musculoskeletal: She exhibits no edema or tenderness.  Lymphadenopathy:    She has no cervical adenopathy.  Neurological: She displays normal reflexes. No cranial nerve deficit. She exhibits normal muscle tone. Coordination normal.  Skin: No rash noted. No erythema.  Psychiatric: She has a normal mood and affect. Her behavior is normal. Judgment and thought content normal.   L big toe foot and L calf is less swollen, not tender - healing paronychia Trace B edema   Lab Results  Component Value Date   WBC 7.9 06/28/2014   HGB 12.0 06/28/2014   HCT 37.2 06/28/2014   PLT 236 06/28/2014   GLUCOSE 96 08/31/2014   CHOL 240* 11/17/2013   TRIG 269.0* 11/17/2013   HDL 32.30* 11/17/2013   LDLDIRECT 147.8 05/08/2013   LDLCALC 154* 11/17/2013   ALT 12 03/20/2014   AST 23 03/20/2014   NA 137 08/31/2014   K 4.8 08/31/2014   CL 109 08/31/2014   CREATININE 1.68* 08/31/2014   BUN 28* 08/31/2014   CO2 23 08/31/2014   TSH 3.06 11/17/2013   INR 2.0 08/22/2014   HGBA1C 5.5 03/20/2014   MICROALBUR 3.1* 09/27/2006         Assessment & Plan:

## 2014-09-21 NOTE — Progress Notes (Signed)
Pre visit review using our clinic review tool, if applicable. No additional management support is needed unless otherwise documented below in the visit note. 

## 2014-09-21 NOTE — Assessment & Plan Note (Signed)
D/c Aleve Cont Demadex

## 2014-09-24 NOTE — Assessment & Plan Note (Signed)
Allopurinol po 

## 2014-09-24 NOTE — Assessment & Plan Note (Signed)
  On diet  

## 2014-09-24 NOTE — Assessment & Plan Note (Signed)
Demadex prn 

## 2014-09-24 NOTE — Assessment & Plan Note (Signed)
On Coumadin 

## 2014-09-24 NOTE — Assessment & Plan Note (Signed)
Wt Readings from Last 3 Encounters:  09/21/14 224 lb (101.606 kg)  08/31/14 223 lb 6.4 oz (101.334 kg)  08/14/14 219 lb (99.338 kg)

## 2014-09-26 ENCOUNTER — Ambulatory Visit (INDEPENDENT_AMBULATORY_CARE_PROVIDER_SITE_OTHER): Payer: Medicare Other | Admitting: General Practice

## 2014-09-26 ENCOUNTER — Ambulatory Visit: Payer: Medicare Other

## 2014-09-26 ENCOUNTER — Ambulatory Visit (HOSPITAL_COMMUNITY)
Admission: RE | Admit: 2014-09-26 | Discharge: 2014-09-26 | Disposition: A | Discharge status: Home / Self Care | Payer: Medicare Other | Source: Ambulatory Visit | Attending: Diagnostic Radiology | Admitting: Diagnostic Radiology

## 2014-09-26 ENCOUNTER — Other Ambulatory Visit (HOSPITAL_COMMUNITY): Payer: Self-pay | Admitting: Diagnostic Radiology

## 2014-09-26 ENCOUNTER — Ambulatory Visit (HOSPITAL_COMMUNITY)
Admission: RE | Admit: 2014-09-26 | Discharge: 2014-09-26 | Disposition: A | Discharge status: Home / Self Care | Payer: Medicare Other | Source: Ambulatory Visit | Attending: Cardiovascular Disease | Admitting: Cardiovascular Disease

## 2014-09-26 ENCOUNTER — Ambulatory Visit (HOSPITAL_COMMUNITY): Payer: Medicare Other

## 2014-09-26 DIAGNOSIS — Z7901 Long term (current) use of anticoagulants: Secondary | ICD-10-CM

## 2014-09-26 DIAGNOSIS — Z5309 Procedure and treatment not carried out because of other contraindication: Secondary | ICD-10-CM

## 2014-09-26 DIAGNOSIS — I82509 Chronic embolism and thrombosis of unspecified deep veins of unspecified lower extremity: Secondary | ICD-10-CM | POA: Diagnosis not present

## 2014-09-26 DIAGNOSIS — G459 Transient cerebral ischemic attack, unspecified: Secondary | ICD-10-CM | POA: Diagnosis not present

## 2014-09-26 DIAGNOSIS — I480 Paroxysmal atrial fibrillation: Secondary | ICD-10-CM | POA: Diagnosis not present

## 2014-09-26 DIAGNOSIS — I82402 Acute embolism and thrombosis of unspecified deep veins of left lower extremity: Secondary | ICD-10-CM

## 2014-09-26 DIAGNOSIS — Z86718 Personal history of other venous thrombosis and embolism: Secondary | ICD-10-CM

## 2014-09-26 LAB — POCT INR: INR: 1.6

## 2014-09-26 NOTE — Progress Notes (Signed)
Agree with plan 

## 2014-09-26 NOTE — Progress Notes (Signed)
Pre visit review using our clinic review tool, if applicable. No additional management support is needed unless otherwise documented below in the visit note. 

## 2014-09-28 ENCOUNTER — Other Ambulatory Visit: Payer: Self-pay | Admitting: Internal Medicine

## 2014-10-09 ENCOUNTER — Telehealth: Payer: Self-pay | Admitting: Internal Medicine

## 2014-10-09 ENCOUNTER — Other Ambulatory Visit: Payer: Self-pay | Admitting: General Practice

## 2014-10-09 MED ORDER — WARFARIN SODIUM 1 MG PO TABS: ORAL_TABLET | ORAL | Status: DC

## 2014-10-09 NOTE — Telephone Encounter (Signed)
Wendy Cole, coumadin clinic RN sent Rf to pharmacy. Left detailed mess informing pt.

## 2014-10-09 NOTE — Telephone Encounter (Signed)
Spouse would like 1mg  to be sent and states that he would like a call back to confirm.  Spouse is very upset and states there will be consequences if not sent.

## 2014-10-09 NOTE — Telephone Encounter (Signed)
States patient has been out of warfarin since Friday.  Is requesting script to be sent to [facility] on [address]

## 2014-10-12 ENCOUNTER — Other Ambulatory Visit: Payer: Self-pay | Admitting: Internal Medicine

## 2014-10-16 ENCOUNTER — Ambulatory Visit (INDEPENDENT_AMBULATORY_CARE_PROVIDER_SITE_OTHER): Payer: Medicare Other | Admitting: General Practice

## 2014-10-16 ENCOUNTER — Other Ambulatory Visit: Payer: Self-pay | Admitting: Cardiovascular Disease

## 2014-10-16 DIAGNOSIS — I480 Paroxysmal atrial fibrillation: Secondary | ICD-10-CM

## 2014-10-16 DIAGNOSIS — I82509 Chronic embolism and thrombosis of unspecified deep veins of unspecified lower extremity: Secondary | ICD-10-CM

## 2014-10-16 DIAGNOSIS — Z86718 Personal history of other venous thrombosis and embolism: Secondary | ICD-10-CM

## 2014-10-16 DIAGNOSIS — Z7901 Long term (current) use of anticoagulants: Secondary | ICD-10-CM | POA: Diagnosis not present

## 2014-10-16 DIAGNOSIS — I82402 Acute embolism and thrombosis of unspecified deep veins of left lower extremity: Secondary | ICD-10-CM

## 2014-10-16 LAB — POCT INR: INR: 1.5

## 2014-10-16 NOTE — Progress Notes (Signed)
I have reviewed and agree with the plan. 

## 2014-10-16 NOTE — Progress Notes (Signed)
Pre visit review using our clinic review tool, if applicable. No additional management support is needed unless otherwise documented below in the visit note. 

## 2014-10-18 ENCOUNTER — Other Ambulatory Visit: Payer: Self-pay | Admitting: Cardiovascular Disease

## 2014-10-23 ENCOUNTER — Encounter: Payer: Self-pay | Admitting: Internal Medicine

## 2014-11-06 ENCOUNTER — Telehealth: Payer: Self-pay | Admitting: Internal Medicine

## 2014-11-06 ENCOUNTER — Other Ambulatory Visit: Payer: Self-pay | Admitting: Internal Medicine

## 2014-11-06 NOTE — Telephone Encounter (Signed)
Called dtr back and spoke to Dr. Alain Marion. Plotnikov requested to add pt to the 11:15/11:30 spot.   Tammy, can you add to Dr. Alain Marion 11:15 spot for 11/07/14

## 2014-11-06 NOTE — Telephone Encounter (Signed)
Pt husband called in and is requesting call back from nurse.  Pt has appt on Friday for really bad headaches.  I think he wants to speak with you about that?

## 2014-11-06 NOTE — Telephone Encounter (Signed)
done

## 2014-11-06 NOTE — Telephone Encounter (Signed)
Spoke to pt about pain. Sx: headache, nausea, vomiting. No falls. No tx is working to help with the pain.

## 2014-11-06 NOTE — Telephone Encounter (Signed)
Pt was contacted. Offered a 4:15 appt but pt declined due to no transportation. Requested that pt give me a call tomorrow for a possible work in at that time as well.

## 2014-11-06 NOTE — Telephone Encounter (Signed)
Cindy called in at 4:20pm.  States someone was suppose to be calling her back about working her mother in with Dr. Alain Marion.  States her mother is in a lot of pain.  I did transfer daughter to speak with the nurse line so they can assess the situation.  Jenny Reichmann can be reached at [phone].

## 2014-11-07 ENCOUNTER — Ambulatory Visit (INDEPENDENT_AMBULATORY_CARE_PROVIDER_SITE_OTHER): Payer: Medicare Other | Admitting: Internal Medicine

## 2014-11-07 ENCOUNTER — Encounter: Payer: Self-pay | Admitting: Internal Medicine

## 2014-11-07 ENCOUNTER — Other Ambulatory Visit (INDEPENDENT_AMBULATORY_CARE_PROVIDER_SITE_OTHER): Payer: Medicare Other

## 2014-11-07 VITALS — BP 102/78 | HR 59 | Wt 223.0 lb

## 2014-11-07 DIAGNOSIS — I639 Cerebral infarction, unspecified: Secondary | ICD-10-CM

## 2014-11-07 DIAGNOSIS — I480 Paroxysmal atrial fibrillation: Secondary | ICD-10-CM

## 2014-11-07 DIAGNOSIS — R51 Headache: Secondary | ICD-10-CM

## 2014-11-07 DIAGNOSIS — R519 Headache, unspecified: Secondary | ICD-10-CM

## 2014-11-07 LAB — TSH: TSH: 3.34 u[IU]/mL (ref 0.35–4.50)

## 2014-11-07 LAB — CBC WITH DIFFERENTIAL/PLATELET
BASOS PCT: 0.6 % (ref 0.0–3.0)
Basophils Absolute: 0.1 10*3/uL (ref 0.0–0.1)
EOS ABS: 0.2 10*3/uL (ref 0.0–0.7)
Eosinophils Relative: 2.4 % (ref 0.0–5.0)
HEMATOCRIT: 35.1 % — AB (ref 36.0–46.0)
Hemoglobin: 11.2 g/dL — ABNORMAL LOW (ref 12.0–15.0)
LYMPHS ABS: 0.5 10*3/uL — AB (ref 0.7–4.0)
Lymphocytes Relative: 5 % — ABNORMAL LOW (ref 12.0–46.0)
MCHC: 31.8 g/dL (ref 30.0–36.0)
MCV: 82 fl (ref 78.0–100.0)
MONOS PCT: 6.1 % (ref 3.0–12.0)
Monocytes Absolute: 0.6 10*3/uL (ref 0.1–1.0)
Neutro Abs: 7.8 10*3/uL — ABNORMAL HIGH (ref 1.4–7.7)
Neutrophils Relative %: 85.9 % — ABNORMAL HIGH (ref 43.0–77.0)
Platelets: 264 10*3/uL (ref 150.0–400.0)
RBC: 4.28 Mil/uL (ref 3.87–5.11)
RDW: 16.2 % — AB (ref 11.5–15.5)
WBC: 9.1 10*3/uL (ref 4.0–10.5)

## 2014-11-07 LAB — BASIC METABOLIC PANEL
BUN: 36 mg/dL — ABNORMAL HIGH (ref 6–23)
CHLORIDE: 108 meq/L (ref 96–112)
CO2: 21 meq/L (ref 19–32)
CREATININE: 1.81 mg/dL — AB (ref 0.40–1.20)
Calcium: 9.2 mg/dL (ref 8.4–10.5)
GFR: 28.84 mL/min — ABNORMAL LOW (ref >60.00)
GLUCOSE: 120 mg/dL — AB (ref 70–99)
Potassium: 5.1 mEq/L (ref 3.5–5.1)
Sodium: 135 mEq/L (ref 135–145)

## 2014-11-07 LAB — SEDIMENTATION RATE: Sed Rate: 10 mm/hr (ref 0–22)

## 2014-11-07 MED ORDER — PREDNISONE 10 MG PO TABS: ORAL_TABLET | ORAL | Status: DC

## 2014-11-07 MED ORDER — PANTOPRAZOLE SODIUM 40 MG PO TBEC: 40.0000 mg | DELAYED_RELEASE_TABLET | Freq: Every day | ORAL | Status: DC

## 2014-11-07 MED ORDER — TRAMADOL HCL 50 MG PO TABS: ORAL_TABLET | ORAL | Status: DC

## 2014-11-07 MED ORDER — CLORAZEPATE DIPOTASSIUM 7.5 MG PO TABS: 7.5000 mg | ORAL_TABLET | Freq: Two times a day (BID) | ORAL | Status: DC | PRN

## 2014-11-07 NOTE — Assessment & Plan Note (Addendum)
6/16 ?relapse of giant cell arteritis Labs Predn 40 mg qd  Potential benefits of a long term high dose steroid  use as well as potential risks  and complications were explained to the patient and were aknowledged.  MRI/MRA was ordered by Dr Johnsie Cancel (fam h/o brain aneurism)

## 2014-11-07 NOTE — Assessment & Plan Note (Signed)
MRI/MRA was ordered by Dr Johnsie Cancel (fam h/o brain aneurism)

## 2014-11-07 NOTE — Assessment & Plan Note (Signed)
Labs

## 2014-11-07 NOTE — Progress Notes (Signed)
Subjective:    Headache  This is a new problem. The current episode started 1 to 4 weeks ago (2 wks). The problem occurs constantly. The problem has been unchanged. The pain is located in the right unilateral region. The pain quality is similar to prior headaches (same as w/giant cell arteritis). The quality of the pain is described as band-like. The pain is severe. Associated symptoms include back pain. Pertinent negatives include no abdominal pain, coughing, dizziness, nausea, numbness, sinus pressure, vomiting or weakness. Nothing aggravates the symptoms. Treatments tried: Tramadol. The treatment provided mild relief. (Giant cell arteritis)  Shortness of Breath Associated symptoms include headaches. Pertinent negatives include no abdominal pain, chest pain, rash or vomiting. (Giant cell arteritis)   No rash  F/u LBP  The patient presents for a follow-up of  chronic hypertension, chronic dyslipidemia, type 2 diabetes, h/o CHF controlled with medicines.   F/u   pain in thor back and B neck/head pain L>R; stiff - taking Excedrin and Tramadol  F/u occ R temporal-occip HA - much worse   Wt Readings from Last 3 Encounters:  11/07/14 223 lb (101.152 kg)  09/21/14 224 lb (101.606 kg)  08/31/14 223 lb 6.4 oz (101.334 kg)   BP Readings from Last 3 Encounters:  11/07/14 102/78  09/21/14 139/84  08/31/14 170/72    Review of Systems  Constitutional: Positive for fatigue. Negative for chills, activity change, appetite change and unexpected weight change.  HENT: Negative for congestion, mouth sores and sinus pressure.   Eyes: Negative for visual disturbance.  Respiratory: Positive for shortness of breath. Negative for cough and chest tightness.   Cardiovascular: Negative for chest pain.  Gastrointestinal: Negative for nausea, vomiting, abdominal pain, blood in stool and abdominal distention.  Genitourinary: Negative for frequency, difficulty urinating and vaginal pain.  Musculoskeletal:  Positive for myalgias, back pain, arthralgias and gait problem. Negative for joint swelling.  Skin: Negative for pallor and rash.  Neurological: Positive for headaches. Negative for dizziness, tremors, weakness and numbness.  Psychiatric/Behavioral: Negative for suicidal ideas, confusion and sleep disturbance.        Objective:   Physical Exam  Constitutional: She appears well-developed. No distress.  HENT:  Head: Normocephalic.  Right Ear: External ear normal.  Left Ear: External ear normal.  Nose: Nose normal.  Mouth/Throat: Oropharynx is clear and moist.  Eyes: Conjunctivae are normal. Pupils are equal, round, and reactive to light. Right eye exhibits no discharge. Left eye exhibits no discharge.  Neck: Normal range of motion. Neck supple. No JVD present. No tracheal deviation present. No thyromegaly present.  Cardiovascular: Normal rate, regular rhythm and normal heart sounds.   Pulmonary/Chest: No stridor. No respiratory distress. She has no wheezes.  Abdominal: Soft. Bowel sounds are normal. She exhibits no distension and no mass. There is no tenderness. There is no rebound and no guarding.  Musculoskeletal: She exhibits no edema or tenderness.  Lymphadenopathy:    She has no cervical adenopathy.  Neurological: She displays normal reflexes. No cranial nerve deficit. She exhibits normal muscle tone. Coordination normal.  Skin: No rash noted. No erythema.  Psychiatric: She has a normal mood and affect. Her behavior is normal. Judgment and thought content normal.  Scalp NT, no rash L big toe foot and L calf is less swollen, not tender - healing paronychia Trace B edema   Lab Results  Component Value Date   WBC 7.9 06/28/2014   HGB 12.0 06/28/2014   HCT 37.2 06/28/2014   PLT 236 06/28/2014  GLUCOSE 96 08/31/2014   CHOL 240* 11/17/2013   TRIG 269.0* 11/17/2013   HDL 32.30* 11/17/2013   LDLDIRECT 147.8 05/08/2013   LDLCALC 154* 11/17/2013   ALT 12 03/20/2014   AST 23  03/20/2014   NA 137 08/31/2014   K 4.8 08/31/2014   CL 109 08/31/2014   CREATININE 1.68* 08/31/2014   BUN 28* 08/31/2014   CO2 23 08/31/2014   TSH 3.06 11/17/2013   INR 1.5 10/16/2014   HGBA1C 5.5 03/20/2014   MICROALBUR 3.1* 09/27/2006         Assessment & Plan:

## 2014-11-07 NOTE — Progress Notes (Signed)
Pre visit review using our clinic review tool, if applicable. No additional management support is needed unless otherwise documented below in the visit note. 

## 2014-11-09 ENCOUNTER — Ambulatory Visit: Payer: Medicare Other | Admitting: Internal Medicine

## 2014-11-13 ENCOUNTER — Ambulatory Visit (INDEPENDENT_AMBULATORY_CARE_PROVIDER_SITE_OTHER): Payer: Medicare Other | Admitting: General Practice

## 2014-11-13 DIAGNOSIS — I82402 Acute embolism and thrombosis of unspecified deep veins of left lower extremity: Secondary | ICD-10-CM | POA: Diagnosis not present

## 2014-11-13 DIAGNOSIS — I480 Paroxysmal atrial fibrillation: Secondary | ICD-10-CM

## 2014-11-13 DIAGNOSIS — Z5181 Encounter for therapeutic drug level monitoring: Secondary | ICD-10-CM

## 2014-11-13 DIAGNOSIS — Z7901 Long term (current) use of anticoagulants: Secondary | ICD-10-CM | POA: Diagnosis not present

## 2014-11-13 DIAGNOSIS — I82509 Chronic embolism and thrombosis of unspecified deep veins of unspecified lower extremity: Secondary | ICD-10-CM | POA: Diagnosis not present

## 2014-11-13 DIAGNOSIS — Z86718 Personal history of other venous thrombosis and embolism: Secondary | ICD-10-CM

## 2014-11-13 LAB — POCT INR: INR: 1.4

## 2014-11-13 NOTE — Progress Notes (Signed)
Pre visit review using our clinic review tool, if applicable. No additional management support is needed unless otherwise documented below in the visit note. 

## 2014-11-13 NOTE — Progress Notes (Signed)
I have reviewed and agree with the plan. 

## 2014-11-20 ENCOUNTER — Encounter: Payer: Self-pay | Admitting: Internal Medicine

## 2014-11-20 ENCOUNTER — Ambulatory Visit (INDEPENDENT_AMBULATORY_CARE_PROVIDER_SITE_OTHER): Payer: Medicare Other | Admitting: Internal Medicine

## 2014-11-20 VITALS — BP 156/90 | HR 60 | Wt 217.0 lb

## 2014-11-20 DIAGNOSIS — I5023 Acute on chronic systolic (congestive) heart failure: Secondary | ICD-10-CM | POA: Diagnosis not present

## 2014-11-20 DIAGNOSIS — M1000 Idiopathic gout, unspecified site: Secondary | ICD-10-CM | POA: Diagnosis not present

## 2014-11-20 DIAGNOSIS — M353 Polymyalgia rheumatica: Secondary | ICD-10-CM | POA: Diagnosis not present

## 2014-11-20 DIAGNOSIS — M5440 Lumbago with sciatica, unspecified side: Secondary | ICD-10-CM

## 2014-11-20 DIAGNOSIS — Z86718 Personal history of other venous thrombosis and embolism: Secondary | ICD-10-CM

## 2014-11-20 DIAGNOSIS — I639 Cerebral infarction, unspecified: Secondary | ICD-10-CM

## 2014-11-20 DIAGNOSIS — I5022 Chronic systolic (congestive) heart failure: Secondary | ICD-10-CM | POA: Diagnosis not present

## 2014-11-20 DIAGNOSIS — E119 Type 2 diabetes mellitus without complications: Secondary | ICD-10-CM

## 2014-11-20 DIAGNOSIS — I480 Paroxysmal atrial fibrillation: Secondary | ICD-10-CM

## 2014-11-20 MED ORDER — PREDNISONE 10 MG PO TABS: ORAL_TABLET | ORAL | Status: DC

## 2014-11-20 NOTE — Assessment & Plan Note (Signed)
Compensated Torsemide, hydralazine, Isosorbide, coreg

## 2014-11-20 NOTE — Assessment & Plan Note (Signed)
On Coumadin 

## 2014-11-20 NOTE — Progress Notes (Signed)
Subjective:    Headache  This is a new problem. The current episode started more than 1 month ago (2 wks). The problem occurs constantly. The problem has been rapidly improving. The pain is located in the right unilateral region. The pain quality is similar to prior headaches (same as w/giant cell arteritis). The quality of the pain is described as band-like. The patient is experiencing no pain. Associated symptoms include back pain. Pertinent negatives include no abdominal pain, coughing, dizziness, nausea, numbness, sinus pressure, vomiting or weakness. Nothing aggravates the symptoms. Treatments tried: Tramadol. The treatment provided mild relief. (Giant cell arteritis)  Shortness of Breath Associated symptoms include headaches. Pertinent negatives include no abdominal pain, chest pain, rash or vomiting. (Giant cell arteritis)  No rash Pt is 50+% better on Prednisone (taking 20 mg a day, not 40 mg). F/u LBP  The patient presents for a follow-up of  chronic hypertension, chronic dyslipidemia, type 2 diabetes, h/o CHF controlled with medicines.   F/u   pain in thor back and B neck/head pain L>R; stiff - taking Excedrin and Tramadol  F/u occ R temporal-occip HA - much worse   Wt Readings from Last 3 Encounters:  11/20/14 217 lb (98.431 kg)  11/07/14 223 lb (101.152 kg)  09/21/14 224 lb (101.606 kg)   BP Readings from Last 3 Encounters:  11/20/14 156/90  11/07/14 102/78  09/21/14 139/84    Review of Systems  Constitutional: Positive for fatigue. Negative for chills, activity change, appetite change and unexpected weight change.  HENT: Negative for congestion, mouth sores and sinus pressure.   Eyes: Negative for visual disturbance.  Respiratory: Positive for shortness of breath. Negative for cough and chest tightness.   Cardiovascular: Negative for chest pain.  Gastrointestinal: Negative for nausea, vomiting, abdominal pain, blood in stool and abdominal distention.   Genitourinary: Negative for frequency, difficulty urinating and vaginal pain.  Musculoskeletal: Positive for myalgias, back pain, arthralgias and gait problem. Negative for joint swelling.  Skin: Negative for pallor and rash.  Neurological: Positive for headaches. Negative for dizziness, tremors, weakness and numbness.  Psychiatric/Behavioral: Negative for suicidal ideas, confusion and sleep disturbance.        Objective:   Physical Exam  Constitutional: She appears well-developed. No distress.  HENT:  Head: Normocephalic.  Right Ear: External ear normal.  Left Ear: External ear normal.  Nose: Nose normal.  Mouth/Throat: Oropharynx is clear and moist.  Eyes: Conjunctivae are normal. Pupils are equal, round, and reactive to light. Right eye exhibits no discharge. Left eye exhibits no discharge.  Neck: Normal range of motion. Neck supple. No JVD present. No tracheal deviation present. No thyromegaly present.  Cardiovascular: Normal rate, regular rhythm and normal heart sounds.   Pulmonary/Chest: No stridor. No respiratory distress. She has no wheezes.  Abdominal: Soft. Bowel sounds are normal. She exhibits no distension and no mass. There is no tenderness. There is no rebound and no guarding.  Musculoskeletal: She exhibits no edema or tenderness.  Lymphadenopathy:    She has no cervical adenopathy.  Neurological: She displays normal reflexes. No cranial nerve deficit. She exhibits normal muscle tone. Coordination normal.  Skin: No rash noted. No erythema.  Psychiatric: She has a normal mood and affect. Her behavior is normal. Judgment and thought content normal.  Scalp NT, no rash L big toe foot and L calf is less swollen, not tender - healing paronychia Trace B edema   Lab Results  Component Value Date   WBC 9.1 11/07/2014  HGB 11.2* 11/07/2014   HCT 35.1* 11/07/2014   PLT 264.0 11/07/2014   GLUCOSE 120* 11/07/2014   CHOL 240* 11/17/2013   TRIG 269.0* 11/17/2013   HDL  32.30* 11/17/2013   LDLDIRECT 147.8 05/08/2013   LDLCALC 154* 11/17/2013   ALT 12 03/20/2014   AST 23 03/20/2014   NA 135 11/07/2014   K 5.1 11/07/2014   CL 108 11/07/2014   CREATININE 1.81* 11/07/2014   BUN 36* 11/07/2014   CO2 21 11/07/2014   TSH 3.34 11/07/2014   INR 1.4 11/13/2014   HGBA1C 5.5 03/20/2014   MICROALBUR 3.1* 09/27/2006         Assessment & Plan:

## 2014-11-20 NOTE — Assessment & Plan Note (Signed)
Pt is 50+% better on Prednisone (taking 20 mg a day, not 40 mg). Cont w/20 mg/d

## 2014-11-20 NOTE — Assessment & Plan Note (Signed)
Tramadol prn ° Potential benefits of a long term opioids use as well as potential risks (i.e. addiction risk, apnea etc) and complications (i.e. Somnolence, constipation and others) were explained to the patient and were aknowledged. ° ° °

## 2014-11-20 NOTE — Assessment & Plan Note (Signed)
Pt is on Prednisone (taking 20 mg a day, not 40 mg) for PMR - no relapse

## 2014-11-20 NOTE — Progress Notes (Signed)
Pre visit review using our clinic review tool, if applicable. No additional management support is needed unless otherwise documented below in the visit note. 

## 2014-11-20 NOTE — Assessment & Plan Note (Signed)
Chronic - diet controlled now (2013). Aggravated when on steroids. Labs next time

## 2014-11-20 NOTE — Assessment & Plan Note (Signed)
Rate controlled On Coumadin [facility]

## 2014-11-27 ENCOUNTER — Ambulatory Visit (INDEPENDENT_AMBULATORY_CARE_PROVIDER_SITE_OTHER): Payer: Medicare Other | Admitting: General Practice

## 2014-11-27 ENCOUNTER — Ambulatory Visit: Payer: Medicare Other

## 2014-11-27 DIAGNOSIS — I82402 Acute embolism and thrombosis of unspecified deep veins of left lower extremity: Secondary | ICD-10-CM | POA: Diagnosis not present

## 2014-11-27 DIAGNOSIS — I82509 Chronic embolism and thrombosis of unspecified deep veins of unspecified lower extremity: Secondary | ICD-10-CM

## 2014-11-27 DIAGNOSIS — Z5181 Encounter for therapeutic drug level monitoring: Secondary | ICD-10-CM

## 2014-11-27 DIAGNOSIS — Z7901 Long term (current) use of anticoagulants: Secondary | ICD-10-CM | POA: Diagnosis not present

## 2014-11-27 DIAGNOSIS — Z86718 Personal history of other venous thrombosis and embolism: Secondary | ICD-10-CM

## 2014-11-27 DIAGNOSIS — I480 Paroxysmal atrial fibrillation: Secondary | ICD-10-CM

## 2014-11-27 LAB — POCT INR: INR: 4.3

## 2014-11-27 NOTE — Progress Notes (Signed)
I have reviewed and agree with the plan. 

## 2014-11-27 NOTE — Progress Notes (Signed)
Pre visit review using our clinic review tool, if applicable. No additional management support is needed unless otherwise documented below in the visit note. 

## 2014-12-06 ENCOUNTER — Other Ambulatory Visit: Payer: Self-pay | Admitting: Internal Medicine

## 2014-12-08 ENCOUNTER — Other Ambulatory Visit: Payer: Self-pay | Admitting: Internal Medicine

## 2014-12-11 ENCOUNTER — Ambulatory Visit (INDEPENDENT_AMBULATORY_CARE_PROVIDER_SITE_OTHER): Payer: Medicare Other | Admitting: General Practice

## 2014-12-11 DIAGNOSIS — Z5181 Encounter for therapeutic drug level monitoring: Secondary | ICD-10-CM | POA: Diagnosis not present

## 2014-12-11 DIAGNOSIS — I82509 Chronic embolism and thrombosis of unspecified deep veins of unspecified lower extremity: Secondary | ICD-10-CM | POA: Diagnosis not present

## 2014-12-11 DIAGNOSIS — Z7901 Long term (current) use of anticoagulants: Secondary | ICD-10-CM | POA: Diagnosis not present

## 2014-12-11 DIAGNOSIS — I82402 Acute embolism and thrombosis of unspecified deep veins of left lower extremity: Secondary | ICD-10-CM | POA: Diagnosis not present

## 2014-12-11 DIAGNOSIS — I480 Paroxysmal atrial fibrillation: Secondary | ICD-10-CM

## 2014-12-11 DIAGNOSIS — Z86718 Personal history of other venous thrombosis and embolism: Secondary | ICD-10-CM

## 2014-12-11 LAB — POCT INR: INR: 2.3

## 2014-12-11 NOTE — Progress Notes (Signed)
I have reviewed and agree with the plan. 

## 2014-12-11 NOTE — Progress Notes (Signed)
Pre visit review using our clinic review tool, if applicable. No additional management support is needed unless otherwise documented below in the visit note. 

## 2014-12-24 ENCOUNTER — Ambulatory Visit: Payer: Medicare Other | Admitting: Internal Medicine

## 2015-01-08 ENCOUNTER — Ambulatory Visit (INDEPENDENT_AMBULATORY_CARE_PROVIDER_SITE_OTHER): Payer: Medicare Other | Admitting: General Practice

## 2015-01-08 DIAGNOSIS — I82402 Acute embolism and thrombosis of unspecified deep veins of left lower extremity: Secondary | ICD-10-CM

## 2015-01-08 DIAGNOSIS — Z86718 Personal history of other venous thrombosis and embolism: Secondary | ICD-10-CM

## 2015-01-08 DIAGNOSIS — I480 Paroxysmal atrial fibrillation: Secondary | ICD-10-CM

## 2015-01-08 DIAGNOSIS — Z5181 Encounter for therapeutic drug level monitoring: Secondary | ICD-10-CM | POA: Diagnosis not present

## 2015-01-08 DIAGNOSIS — I82509 Chronic embolism and thrombosis of unspecified deep veins of unspecified lower extremity: Secondary | ICD-10-CM

## 2015-01-08 DIAGNOSIS — Z7901 Long term (current) use of anticoagulants: Secondary | ICD-10-CM

## 2015-01-08 LAB — POCT INR: INR: 2.3

## 2015-01-08 NOTE — Progress Notes (Signed)
I have reviewed and agree with the plan. 

## 2015-01-08 NOTE — Progress Notes (Signed)
Pre visit review using our clinic review tool, if applicable. No additional management support is needed unless otherwise documented below in the visit note. 

## 2015-01-09 ENCOUNTER — Other Ambulatory Visit: Payer: Self-pay | Admitting: Internal Medicine

## 2015-01-14 ENCOUNTER — Encounter: Payer: Self-pay | Admitting: Internal Medicine

## 2015-01-14 ENCOUNTER — Ambulatory Visit (INDEPENDENT_AMBULATORY_CARE_PROVIDER_SITE_OTHER): Payer: Medicare Other | Admitting: Internal Medicine

## 2015-01-14 VITALS — BP 118/78 | HR 70 | Ht 64.0 in | Wt 217.0 lb

## 2015-01-14 DIAGNOSIS — J810 Acute pulmonary edema: Secondary | ICD-10-CM | POA: Diagnosis not present

## 2015-01-14 DIAGNOSIS — Z9989 Dependence on other enabling machines and devices: Principal | ICD-10-CM

## 2015-01-14 DIAGNOSIS — I639 Cerebral infarction, unspecified: Secondary | ICD-10-CM

## 2015-01-14 DIAGNOSIS — G4733 Obstructive sleep apnea (adult) (pediatric): Secondary | ICD-10-CM | POA: Diagnosis not present

## 2015-01-14 NOTE — Assessment & Plan Note (Signed)
Good compliance and control on CPAP 10/ [facility]. She won't sleep without it and CPAP remains medically necessary.

## 2015-01-14 NOTE — Assessment & Plan Note (Signed)
Discussed CXR 05/21/14 as indicating pulmonary vascular congestion of fluid overload while symptomatic at that time. This should be considered with future dyspneic events.

## 2015-01-14 NOTE — Patient Instructions (Signed)
We can continue CPAP 10/ [facility]- I'm glad you are doing so well now.  Please call if we can help

## 2015-01-14 NOTE — Progress Notes (Signed)
Subjective:    [name], female    DOB: Apr 22, 1936, 79 y.o.   MRN: [id number]  HPI 10/17/10- 70 yoF former smoker followed for OSA and COPD, complicated by DM, hx breast cancer, hx DVT. Here with husband. Last here Oct 17, 2009- note reviewed.  She denies any significant breathing issues since last here. Continues to use CPAP at 10 all night every night. Never recurrence of DVT left leg.  Noted minor cough when first lying down- they are not concerned.   10/22/11- 42 yoF former smoker followed for OSA and COPD, complicated by DM, hx breast cancer, hx DVT.... Here with husband. Denies any SOB, wheezing, cough, or congestion. Wears CPAP every night for 5-6 hours approx. Stable dyspnea on exertion with hills and stairs. Hospitalized from February 18 through 26 with pulmonary edema, history AFib, chronic Coumadin, history of DVT. Ejection fraction 25-30%.  01/29/12- 89 yoF former smoker followed for OSA and COPD, complicated by DM, hx breast cancer, hx DVT.... Here with husband. ACUTE VISIT: Increased SOB and worse at night; get raspy breathing prior to SOB starting up. This episode made her feel like her stomach was going to pop. Hospital 8/20 through 01/13/2012-right carotid endarterectomy/Dr. Bridgett Larsson. Occasional episodes all summer described as onset of shortness of breath in the evening after dinner when she is reading or watching TV or it she starts clearing her throat and feels bloated in the abdomen which makes her more short of breath. Worse if lying down. Denies fever, purulent discharge, palpitation or chest pain. 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 CPAP all night every night. She has been mis- using Advair, saving it for her episodes of dyspnea, so she uses it once at night with 2 or 3 activations. I reeducated her on use as a maintenance inhaler.  03/15/12- 19 yoF former smoker followed for OSA and  COPD, complicated by DM, hx breast cancer, hx DVT.... Here with husband.  Pt states that SOB has improved since last OV. No complaints She feels well and is walking more. Recognizes fluid retention causes shortness of breath. Her diuretic therapy helps. Pleased with CPAP 10/Advanced used all night every night. CXR 02/02/12-reviewed with her IMPRESSION:  Cardiomegaly. COPD/chronic changes. No active disease.  Original Report Authenticated By: Raelyn Number, M.D.   09/13/12- 79 yoF former smoker followed for OSA and COPD, complicated by DM, hx breast cancer, hx DVT.... Here with husband. FOLLOWS FOR:SOB at times and feelings of not being able to breathe-denies this happens with activity; states she goes to bed reading and about 30 minutes later she cant breathe and feels congestion. CPAP 10/ Advanced Is positional dyspnea happens about one time a month and is relieved by sitting up and waiting for it to go away.intends to start before she puts her CPAP on. She has been using either Dulera or Advair intermittently as rescue inhalers which we discussed. She emphasizes that she gets acutely short of breath and will grab for anything. She wasn't satisfied to discuss the difference between maintenance and rescue inhalers. She is being worked up for anemia. Glaucoma is treated with Timoptic which may cause bronchospasm. History of diastolic heart failure followed by cardiology. CXR 05/03/12 IMPRESSION:  Cardiac enlargement with pulmonary vascular congestion and  interstitial edema similar to previous study.  Original Report Authenticated By: Lucienne Capers, M.D.  11/14/12-  53 yoF former smoker followed for OSA and COPD, complicated by  DM, hx breast cancer, hx DVT, hx anemia.... Here with husband. FOLLWS FOR: review PFT with patient; Occasional SOB at times. Continues CPAP 10/ [employer] Reports little cough. BNP 09/13/12- 458, down from 3006 PFT- 11/14/2012-normal spirometry flows without response to  bronchodilator, normal lung volumes, diffusion moderately reduced/55% predicted CXR 09/13/12 IMPRESSION:  Enlargement of cardiac silhouette with pulmonary vascular  congestion.  Minimal chronic bronchitic changes with left basilar atelectasis.  Original Report Authenticated By: Lavonia Dana, M.D.  05/15/13- 29 yoF former smoker followed for OSA and COPD, complicated by DM, hx breast cancer, hx DVT, hx anemia.... Here with husband. FOLLOWS FOR: SOB and wheezing at times(happens all of a sudden); Has had to use Advair  If upset or nervous her breathing gets raspy and short of breath. She sits quietly and takes chlorazepate her breathing becomes comfortable again. Uses Proventil and Advair interchangeably as rescue inhalers-educated on this. Occasional prednisone, about 3 days a month, for arthralgias CPAP 10/Advanced good compliance and control  11/13/13- 4 yoF former smoker followed for OSA and COPD, complicated by DM, hx breast cancer, hx DVT, hx anemia.... Here with husband. FOLLOWS FOR:  Breathing is unchanged since last OV, has good and bad days with weather.  Wearing CPAP 10/ Advanced 8 hours per night She feels CPAP is doing well. Sleeps well. Occasional shortness of breath mostly with exertion. Prednisone 3 days per week for arthritis  05/15/14-78 yoF former smoker followed for OSA and COPD, complicated by DM, hx breast cancer, hx DVT, hx anemia.... Here with husband FOLLOW FOR: gets short of breath walking from one end to the other end of the house; she has a habit of holding her breath and has been trying to deliberately breath as she is moving about.  Weather has been causing her a lot of trouble breathing.   CPAP 10/ Advanced- won't sleep w/o. Persistent dyspnea on exertion 6 months, not progressive. Some better days. Occasionally little wheeze but not much cough, no chest pain. Occasional palpitation. Feet swell a little. Echocardiogram 03/01/2014-PAS 69, mild RAE, severe LAE Walk  test today on room air: Resting saturation 91-92%. Desaturated walking to 86% on room air Diffusion moderately reduced with normal spirometry flows on PFT - 2014.  07/16/14-78 yoF former smoker followed for OSA and COPD, complicated by DM, hx breast cancer, hx DVT, hx anemia.... Here with husband CPAP 10/ Advanced all night every night Follow up: SOB on exertion; no cough; sleeps approx 7hrs nightly on CPAP. She is doing well with CPAP. Desaturated last visit on walk test. D-dimer was elevated 1.11 V/Q scan done because of renal function, was low prob.  01/14/15- 78 yoF former smoker followed for OSA and COPD, complicated by DM, hx breast cancer, hx DVT, hx anemia.... Here with husband FOLLOWS FOR: Pt states she is wearing CPAP 10/ Advanced nightly for about  hours per night. Pt denies issues with mask, pressure, or machine. Pt denies any complaints at this time.  We discussed pulmonary vascular congestion consistent with cardiogenic fluid overload last winter as seen on CXR from December- reviewed. She knows to contact cardiology if this happens again.  CXR 05/21/14  IMPRESSION: 1. Cardiomegaly with pulmonary venous congestion. No change PA 2. Stable small focus of scarring left mid lung field. Electronically Signed  By: Marcello Moores Register  On: 05/21/2014 09:31  ROS-see HPI Constitutional:   No-   weight loss, night sweats, fevers, chills, fatigue, lassitude. HEENT:   No-  headaches, difficulty swallowing, tooth/dental problems, sore throat,  No-  sneezing, itching, ear ache, nasal congestion, post nasal drip,  CV:  No-   chest pain, +orthopnea, PND, swelling in lower extremities, anasarca, dizziness, palpitations Resp: +shortness of breath with exertion or at rest.              No-   productive cough,  No non-productive cough,  No- coughing up of blood.              No-   change in color of mucus.  No- wheezing.   Skin: No-   rash or lesions. GI:  No-   heartburn, indigestion,  abdominal pain, nausea, vomiting,  GU:  MS:  + joint pain or swelling.   Neuro-     nothing unusual Psych:  No- change in mood or affect. No depression or anxiety.  No memory loss.  OBJ- Physical Exam General- Alert, Oriented, Affect-appropriate, Distress- none acute, overweight Skin- rash-none, lesions- none, excoriation- none Lymphadenopathy- none Head- atraumatic            Eyes- Gross vision intact, PERRLA, conjunctivae and secretions clear            Ears- +Hearing seems reduced            Nose- Clear, no-Septal dev, mucus, polyps, erosion, perforation             Throat- Mallampati II , mucosa clear , drainage- none, tonsils- atrophic Neck- flexible , trachea midline, no stridor , thyroid nl, + Bilateral carotid scars Chest - symmetrical excursion , unlabored           Heart/CV- RRR , +2/0 systolic Aortic murmur , no gallop  , no rub, nl s1 s2                           - JVD- none , edema+trace, stasis changes- none, varices- none           Lung- clear, wheeze- none, cough- none , dullness-none, rub- none           Chest wall-  Abd-  Br/ Gen/ Rectal- Not done, not indicated Extrem- cyanosis- none, clubbing, none, atrophy- none, strength- nl Neuro- grossly intact to observation

## 2015-01-21 ENCOUNTER — Encounter: Payer: Self-pay | Admitting: Internal Medicine

## 2015-01-21 ENCOUNTER — Ambulatory Visit (INDEPENDENT_AMBULATORY_CARE_PROVIDER_SITE_OTHER)
Admission: RE | Admit: 2015-01-21 | Discharge: 2015-01-21 | Disposition: A | Discharge status: Home / Self Care | Payer: Medicare Other | Source: Ambulatory Visit | Attending: Internal Medicine | Admitting: Internal Medicine

## 2015-01-21 ENCOUNTER — Ambulatory Visit (INDEPENDENT_AMBULATORY_CARE_PROVIDER_SITE_OTHER): Payer: Medicare Other | Admitting: Internal Medicine

## 2015-01-21 VITALS — BP 143/70 | HR 69 | Temp 97.9°F | Ht 64.0 in

## 2015-01-21 DIAGNOSIS — M353 Polymyalgia rheumatica: Secondary | ICD-10-CM

## 2015-01-21 DIAGNOSIS — M546 Pain in thoracic spine: Secondary | ICD-10-CM

## 2015-01-21 DIAGNOSIS — I639 Cerebral infarction, unspecified: Secondary | ICD-10-CM

## 2015-01-21 DIAGNOSIS — J9601 Acute respiratory failure with hypoxia: Secondary | ICD-10-CM | POA: Diagnosis not present

## 2015-01-21 DIAGNOSIS — E669 Obesity, unspecified: Secondary | ICD-10-CM | POA: Diagnosis not present

## 2015-01-21 MED ORDER — CLORAZEPATE DIPOTASSIUM 7.5 MG PO TABS: 7.5000 mg | ORAL_TABLET | Freq: Two times a day (BID) | ORAL | Status: DC | PRN

## 2015-01-21 MED ORDER — PREDNISONE 1 MG PO TABS: ORAL_TABLET | ORAL | Status: DC

## 2015-01-21 MED ORDER — FLUTICASONE-SALMETEROL 100-50 MCG/DOSE IN AEPB: 1.0000 | INHALATION_SPRAY | Freq: Two times a day (BID) | RESPIRATORY_TRACT | Status: AC

## 2015-01-21 MED ORDER — PREDNISONE 5 MG PO TABS: 5.0000 mg | ORAL_TABLET | Freq: Every day | ORAL | Status: DC

## 2015-01-21 NOTE — Assessment & Plan Note (Signed)
8/16 pt is on 10 mg only, never took Prednisone 20 mg/d - taper down to 9 mg/d x 1 mo, 8 mg/d x 1 mo etc

## 2015-01-21 NOTE — Progress Notes (Signed)
Pre visit review using our clinic review tool, if applicable. No additional management support is needed unless otherwise documented below in the visit note. 

## 2015-01-21 NOTE — Progress Notes (Signed)
Subjective:  Patient ID: Joellyn Haff, female    DOB: [birth year]  Age: 79 y.o. MRN: [id number]  CC: No chief complaint on file.   HPI TYMBER STALLINGS presents for PMR (pt is on 10 mg only, never took Prednisone 20 mg/d), HTN, CHF C/o low thor back x ?6 wks severe   Outpatient Prescriptions Prior to Visit  Medication Sig Dispense Refill  . acetaminophen (TYLENOL) 500 MG tablet Take 500 mg by mouth daily as needed.    [name] albuterol (PROVENTIL HFA;VENTOLIN HFA) 108 (90 BASE) MCG/ACT inhaler Inhale 2 puffs into the lungs every 6 (six) hours as needed for wheezing or shortness of breath. 1 Inhaler 6  . allopurinol (ZYLOPRIM) 100 MG tablet TAKE 1 TABLET three times a week    . brimonidine (ALPHAGAN) 0.15 % ophthalmic solution Place 1 drop into both eyes daily.     [name] buPROPion (WELLBUTRIN SR) 150 MG 12 hr tablet every morning.   3  . calcium carbonate (CALCIUM 500) 1250 MG tablet Take 1 tablet by mouth. Three days a week    . carvedilol (COREG) 12.5 MG tablet TAKING 2 TABLETS IN THE MORNING AND 1 AT NIGHT 90 tablet 5  . clorazepate (TRANXENE) 7.5 MG tablet Take 1 tablet (7.5 mg total) by mouth 2 (two) times daily as needed for anxiety. 60 tablet 5  . ferrous sulfate 325 (65 FE) MG EC tablet Take 325 mg by mouth daily.    . Fluticasone-Salmeterol (ADVAIR DISKUS) 100-50 MCG/DOSE AEPB Inhale 1 puff into the lungs every 12 (twelve) hours. Discontinue all previous refills for this medication. Hold/Fill when appropriate according to pt rx fill schedule. 180 each 3  . hydrALAZINE (APRESOLINE) 25 MG tablet TAKE 1 TABLET (25 MG TOTAL) BY MOUTH 3 (THREE) TIMES DAILY. 270 tablet 1  . isosorbide mononitrate (IMDUR) 60 MG 24 hr tablet TAKE 1 TABLET (60 MG TOTAL) BY MOUTH DAILY. 30 tablet 5  . levothyroxine (SYNTHROID, LEVOTHROID) 137 MCG tablet TAKE 1 TABLET BY MOUTH DAILY 30 tablet 5  . prednisoLONE acetate (PRED FORTE) 1 % ophthalmic suspension Place 1 drop into both eyes daily as needed. For eye  pain    . predniSONE (DELTASONE) 10 MG tablet 20 mg po q am 120 tablet 2  . timolol (TIMOPTIC) 0.5 % ophthalmic solution Place 1 drop into both eyes 2 (two) times daily. Twice daily    . torsemide (DEMADEX) 20 MG tablet TAKE  1 TAB  EVERY OTHER  DAY (Patient taking differently: Take 20 mg by mouth daily. TAKE  1 TAB  EVERY OTHER  DAY) 15 tablet 6  . traMADol (ULTRAM) 50 MG tablet TAKE 1-2 TABLETS BY MOUTH 2 TIMES DAILY AS NEEDED FOR MODERATE OR SEVERE PAIN 100 tablet 2  . triamcinolone cream (KENALOG) 0.1 % Apply 1 application topically 2 (two) times daily. 90 g 3  . vitamin D, CHOLECALCIFEROL, 400 UNITS tablet Take 400 Units by mouth. Three days a week    . warfarin (COUMADIN) 1 MG tablet Take as directed by anticoagulation clinic. 50 tablet 3  . warfarin (COUMADIN) 3 MG tablet TAKE 1 TABLET (3 MG TOTAL) BY MOUTH DAILY OR AS DIRECTED BY PHYSICIAN. 30 tablet 1   No facility-administered medications prior to visit.    ROS Review of Systems  Objective:  BP 143/70 mmHg  Pulse 69  Temp(Src) 97.9 F (36.6 C) (Oral)  Ht 5\' 4"  (1.626 m)  SpO2 95%  BP Readings from Last 3 Encounters:  01/21/15 143/70  01/14/15 118/78  11/20/14 156/90    Wt Readings from Last 3 Encounters:  01/14/15 217 lb (98.431 kg)  11/20/14 217 lb (98.431 kg)  11/07/14 223 lb (101.152 kg)    Physical Exam  Lab Results  Component Value Date   WBC 9.1 11/07/2014   HGB 11.2* 11/07/2014   HCT 35.1* 11/07/2014   PLT 264.0 11/07/2014   GLUCOSE 120* 11/07/2014   CHOL 240* 11/17/2013   TRIG 269.0* 11/17/2013   HDL 32.30* 11/17/2013   LDLDIRECT 147.8 05/08/2013   LDLCALC 154* 11/17/2013   ALT 12 03/20/2014   AST 23 03/20/2014   NA 135 11/07/2014   K 5.1 11/07/2014   CL 108 11/07/2014   CREATININE 1.81* 11/07/2014   BUN 36* 11/07/2014   CO2 21 11/07/2014   TSH 3.34 11/07/2014   INR 2.3 01/08/2015   HGBA1C 5.5 03/20/2014   MICROALBUR 3.1* 09/27/2006    Dg Eye Foreign Body  09/26/2014   CLINICAL DATA:   Glaucoma shunt.  Evaluate for MRI  EXAM: ORBITS FOR FOREIGN BODY - 2 VIEW  COMPARISON:  None.  FINDINGS: Radiopaque implanted device in the right orbit. No other foreign body in the orbit.  IMPRESSION: Radiopaque implanted device right lateral orbit. This is likely safe for MRI however further evaluation of the actual device is recommended.   Electronically Signed   By: Franchot Gallo M.D.   On: 09/26/2014 15:37    Assessment & Plan:   Diagnoses and all orders for this visit:  Acute respiratory failure with hypoxia -     Fluticasone-Salmeterol (ADVAIR DISKUS) 100-50 MCG/DOSE AEPB; Inhale 1 puff into the lungs every 12 (twelve) hours. Discontinue all previous refills for this medication. Hold/Fill when appropriate according to pt rx fill schedule.  Other orders -     clorazepate (TRANXENE) 7.5 MG tablet; Take 1 tablet (7.5 mg total) by mouth 2 (two) times daily as needed for anxiety. -     predniSONE (DELTASONE) 10 MG tablet; 20 mg po q am   I am having Ms. Prazak maintain her prednisoLONE acetate, timolol, vitamin D (CHOLECALCIFEROL), brimonidine, calcium carbonate, albuterol, allopurinol, ferrous sulfate, triamcinolone cream, torsemide, buPROPion, Fluticasone-Salmeterol, levothyroxine, hydrALAZINE, carvedilol, warfarin, isosorbide mononitrate, traMADol, acetaminophen, clorazepate, predniSONE, and warfarin.  No orders of the defined types were placed in this encounter.     Follow-up: No Follow-up on file.  Walker Kehr, MD

## 2015-01-21 NOTE — Assessment & Plan Note (Signed)
Wt Readings from Last 3 Encounters:  01/14/15 217 lb (98.431 kg)  11/20/14 217 lb (98.431 kg)  11/07/14 223 lb (101.152 kg)

## 2015-01-21 NOTE — Assessment & Plan Note (Signed)
8/16 subacute X ray

## 2015-02-05 ENCOUNTER — Ambulatory Visit (INDEPENDENT_AMBULATORY_CARE_PROVIDER_SITE_OTHER): Payer: Medicare Other | Admitting: General Practice

## 2015-02-05 DIAGNOSIS — I82509 Chronic embolism and thrombosis of unspecified deep veins of unspecified lower extremity: Secondary | ICD-10-CM | POA: Diagnosis not present

## 2015-02-05 DIAGNOSIS — I480 Paroxysmal atrial fibrillation: Secondary | ICD-10-CM

## 2015-02-05 DIAGNOSIS — Z5181 Encounter for therapeutic drug level monitoring: Secondary | ICD-10-CM

## 2015-02-05 DIAGNOSIS — I82402 Acute embolism and thrombosis of unspecified deep veins of left lower extremity: Secondary | ICD-10-CM | POA: Diagnosis not present

## 2015-02-05 DIAGNOSIS — Z7901 Long term (current) use of anticoagulants: Secondary | ICD-10-CM

## 2015-02-05 DIAGNOSIS — Z86718 Personal history of other venous thrombosis and embolism: Secondary | ICD-10-CM

## 2015-02-05 LAB — POCT INR: INR: 4.5

## 2015-02-05 NOTE — Progress Notes (Signed)
Pre visit review using our clinic review tool, if applicable. No additional management support is needed unless otherwise documented below in the visit note. 

## 2015-02-05 NOTE — Progress Notes (Signed)
I have reviewed and agree with the plan. 

## 2015-02-10 ENCOUNTER — Other Ambulatory Visit: Payer: Self-pay | Admitting: Internal Medicine

## 2015-02-11 ENCOUNTER — Other Ambulatory Visit: Payer: Self-pay | Admitting: General Practice

## 2015-02-11 MED ORDER — WARFARIN SODIUM 3 MG PO TABS: ORAL_TABLET | ORAL | Status: DC

## 2015-02-12 ENCOUNTER — Other Ambulatory Visit: Payer: Self-pay | Admitting: Internal Medicine

## 2015-02-26 ENCOUNTER — Ambulatory Visit: Payer: Medicare Other

## 2015-02-26 ENCOUNTER — Ambulatory Visit (INDEPENDENT_AMBULATORY_CARE_PROVIDER_SITE_OTHER): Payer: Medicare Other | Admitting: General Practice

## 2015-02-26 DIAGNOSIS — I82509 Chronic embolism and thrombosis of unspecified deep veins of unspecified lower extremity: Secondary | ICD-10-CM

## 2015-02-26 DIAGNOSIS — I480 Paroxysmal atrial fibrillation: Secondary | ICD-10-CM

## 2015-02-26 DIAGNOSIS — Z5181 Encounter for therapeutic drug level monitoring: Secondary | ICD-10-CM | POA: Diagnosis not present

## 2015-02-26 DIAGNOSIS — Z7901 Long term (current) use of anticoagulants: Secondary | ICD-10-CM

## 2015-02-26 DIAGNOSIS — I82402 Acute embolism and thrombosis of unspecified deep veins of left lower extremity: Secondary | ICD-10-CM

## 2015-02-26 DIAGNOSIS — Z86718 Personal history of other venous thrombosis and embolism: Secondary | ICD-10-CM

## 2015-02-26 LAB — POCT INR: INR: 3.5

## 2015-02-26 NOTE — Progress Notes (Signed)
Pre visit review using our clinic review tool, if applicable. No additional management support is needed unless otherwise documented below in the visit note. 

## 2015-02-27 NOTE — Progress Notes (Signed)
I have reviewed and agree with the plan. 

## 2015-03-11 ENCOUNTER — Other Ambulatory Visit: Payer: Self-pay | Admitting: Internal Medicine

## 2015-03-15 LAB — HM MAMMOGRAPHY

## 2015-03-19 ENCOUNTER — Ambulatory Visit: Payer: Medicare Other

## 2015-03-25 ENCOUNTER — Telehealth: Payer: Self-pay | Admitting: Oncology

## 2015-03-25 ENCOUNTER — Ambulatory Visit: Payer: Medicare Other | Admitting: Internal Medicine

## 2015-03-25 NOTE — Telephone Encounter (Signed)
Returned patient call re r/s 11/1 appointment. Gave patient/spouse new appointment for 12/8.

## 2015-03-26 ENCOUNTER — Ambulatory Visit: Payer: Medicare Other

## 2015-03-26 ENCOUNTER — Other Ambulatory Visit: Payer: Medicare Other

## 2015-03-26 ENCOUNTER — Ambulatory Visit: Payer: Medicare Other | Admitting: Oncology

## 2015-03-27 ENCOUNTER — Ambulatory Visit (INDEPENDENT_AMBULATORY_CARE_PROVIDER_SITE_OTHER): Payer: Medicare Other | Admitting: General Practice

## 2015-03-27 DIAGNOSIS — Z5181 Encounter for therapeutic drug level monitoring: Secondary | ICD-10-CM

## 2015-03-27 DIAGNOSIS — I480 Paroxysmal atrial fibrillation: Secondary | ICD-10-CM | POA: Diagnosis not present

## 2015-03-27 LAB — POCT INR: INR: 7.3

## 2015-03-27 NOTE — Progress Notes (Signed)
Pre visit review using our clinic review tool, if applicable. No additional management support is needed unless otherwise documented below in the visit note. 

## 2015-03-27 NOTE — Progress Notes (Signed)
I have reviewed and agree with the plan. 

## 2015-03-28 ENCOUNTER — Ambulatory Visit: Payer: Medicare Other | Admitting: Oncology

## 2015-03-28 ENCOUNTER — Encounter: Payer: Self-pay | Admitting: Internal Medicine

## 2015-03-28 ENCOUNTER — Other Ambulatory Visit: Payer: Medicare Other

## 2015-03-29 ENCOUNTER — Other Ambulatory Visit: Payer: Self-pay | Admitting: Cardiovascular Disease

## 2015-04-02 ENCOUNTER — Ambulatory Visit (INDEPENDENT_AMBULATORY_CARE_PROVIDER_SITE_OTHER): Payer: Medicare Other | Admitting: General Practice

## 2015-04-02 DIAGNOSIS — Z5181 Encounter for therapeutic drug level monitoring: Secondary | ICD-10-CM | POA: Diagnosis not present

## 2015-04-02 DIAGNOSIS — I82509 Chronic embolism and thrombosis of unspecified deep veins of unspecified lower extremity: Secondary | ICD-10-CM

## 2015-04-02 DIAGNOSIS — I82402 Acute embolism and thrombosis of unspecified deep veins of left lower extremity: Secondary | ICD-10-CM

## 2015-04-02 DIAGNOSIS — Z86718 Personal history of other venous thrombosis and embolism: Secondary | ICD-10-CM

## 2015-04-02 DIAGNOSIS — Z7901 Long term (current) use of anticoagulants: Secondary | ICD-10-CM | POA: Diagnosis not present

## 2015-04-02 DIAGNOSIS — I480 Paroxysmal atrial fibrillation: Secondary | ICD-10-CM

## 2015-04-02 LAB — POCT INR: INR: 1.2

## 2015-04-02 NOTE — Progress Notes (Signed)
I have reviewed and agree with the plan. 

## 2015-04-02 NOTE — Progress Notes (Signed)
Pre visit review using our clinic review tool, if applicable. No additional management support is needed unless otherwise documented below in the visit note. 

## 2015-04-09 ENCOUNTER — Ambulatory Visit (INDEPENDENT_AMBULATORY_CARE_PROVIDER_SITE_OTHER): Payer: Medicare Other | Admitting: General Practice

## 2015-04-09 DIAGNOSIS — Z7901 Long term (current) use of anticoagulants: Secondary | ICD-10-CM | POA: Diagnosis not present

## 2015-04-09 DIAGNOSIS — Z5181 Encounter for therapeutic drug level monitoring: Secondary | ICD-10-CM | POA: Diagnosis not present

## 2015-04-09 DIAGNOSIS — I480 Paroxysmal atrial fibrillation: Secondary | ICD-10-CM

## 2015-04-09 DIAGNOSIS — I82509 Chronic embolism and thrombosis of unspecified deep veins of unspecified lower extremity: Secondary | ICD-10-CM

## 2015-04-09 DIAGNOSIS — Z86718 Personal history of other venous thrombosis and embolism: Secondary | ICD-10-CM

## 2015-04-09 DIAGNOSIS — I82402 Acute embolism and thrombosis of unspecified deep veins of left lower extremity: Secondary | ICD-10-CM | POA: Diagnosis not present

## 2015-04-09 LAB — POCT INR: INR: 2.1

## 2015-04-09 NOTE — Progress Notes (Signed)
I have reviewed and agree with the plan. 

## 2015-04-09 NOTE — Progress Notes (Signed)
Pre visit review using our clinic review tool, if applicable. No additional management support is needed unless otherwise documented below in the visit note. 

## 2015-04-12 ENCOUNTER — Ambulatory Visit (INDEPENDENT_AMBULATORY_CARE_PROVIDER_SITE_OTHER): Payer: Medicare Other | Admitting: Internal Medicine

## 2015-04-12 ENCOUNTER — Encounter: Payer: Self-pay | Admitting: Internal Medicine

## 2015-04-12 ENCOUNTER — Other Ambulatory Visit (INDEPENDENT_AMBULATORY_CARE_PROVIDER_SITE_OTHER): Payer: Medicare Other

## 2015-04-12 VITALS — BP 132/80 | HR 69 | Temp 98.3°F | Resp 18 | Ht 66.0 in | Wt 224.0 lb

## 2015-04-12 DIAGNOSIS — E119 Type 2 diabetes mellitus without complications: Secondary | ICD-10-CM

## 2015-04-12 DIAGNOSIS — Z23 Encounter for immunization: Secondary | ICD-10-CM

## 2015-04-12 DIAGNOSIS — I480 Paroxysmal atrial fibrillation: Secondary | ICD-10-CM

## 2015-04-12 DIAGNOSIS — M353 Polymyalgia rheumatica: Secondary | ICD-10-CM

## 2015-04-12 DIAGNOSIS — E785 Hyperlipidemia, unspecified: Secondary | ICD-10-CM | POA: Diagnosis not present

## 2015-04-12 DIAGNOSIS — M546 Pain in thoracic spine: Secondary | ICD-10-CM

## 2015-04-12 DIAGNOSIS — E1159 Type 2 diabetes mellitus with other circulatory complications: Secondary | ICD-10-CM

## 2015-04-12 DIAGNOSIS — I639 Cerebral infarction, unspecified: Secondary | ICD-10-CM | POA: Diagnosis not present

## 2015-04-12 DIAGNOSIS — I10 Essential (primary) hypertension: Secondary | ICD-10-CM

## 2015-04-12 LAB — BASIC METABOLIC PANEL
BUN: 27 mg/dL — ABNORMAL HIGH (ref 6–23)
CALCIUM: 8.8 mg/dL (ref 8.4–10.5)
CO2: 22 meq/L (ref 19–32)
Chloride: 106 mEq/L (ref 96–112)
Creatinine, Ser: 2.03 mg/dL — ABNORMAL HIGH (ref 0.40–1.20)
GFR: 25.23 mL/min — ABNORMAL LOW (ref >60.00)
Glucose, Bld: 122 mg/dL — ABNORMAL HIGH (ref 70–99)
POTASSIUM: 4.3 meq/L (ref 3.5–5.1)
SODIUM: 138 meq/L (ref 135–145)

## 2015-04-12 LAB — HEMOGLOBIN A1C: Hgb A1c MFr Bld: 6.4 % (ref 4.6–6.5)

## 2015-04-12 LAB — SEDIMENTATION RATE: SED RATE: 27 mm/h — AB (ref 0–22)

## 2015-04-12 MED ORDER — PREDNISONE 5 MG PO TABS: 5.0000 mg | ORAL_TABLET | Freq: Every day | ORAL | Status: DC

## 2015-04-12 MED ORDER — PREDNISONE 1 MG PO TABS: ORAL_TABLET | ORAL | Status: DC

## 2015-04-12 NOTE — Progress Notes (Signed)
Subjective:  [name], female    DOB: [birth year]  Age: 79 y.o. MRN: [id number]  CC: No chief complaint on file.   HPI Wendy Cole presents for gout, HTN, edema, PMR f/u. C/o nausea sometimes  Outpatient Prescriptions Prior to Visit  Medication Sig Dispense Refill  . acetaminophen (TYLENOL) 500 MG tablet Take 500 mg by mouth daily as needed.    [name] albuterol (PROVENTIL HFA;VENTOLIN HFA) 108 (90 BASE) MCG/ACT inhaler Inhale 2 puffs into the lungs every 6 (six) hours as needed for wheezing or shortness of breath. 1 Inhaler 6  . allopurinol (ZYLOPRIM) 100 MG tablet TAKE 1 TABLET three times a week    . brimonidine (ALPHAGAN) 0.15 % ophthalmic solution Place 1 drop into both eyes daily.     [name] buPROPion (WELLBUTRIN SR) 150 MG 12 hr tablet every morning.   3  . calcium carbonate (CALCIUM 500) 1250 MG tablet Take 1 tablet by mouth. Three days a week    . carvedilol (COREG) 12.5 MG tablet TAKE 2 TABLETS BY MOUTH EVERY MORNING AND 1 AT NIGHT 90 tablet 5  . clorazepate (TRANXENE) 7.5 MG tablet Take 1 tablet (7.5 mg total) by mouth 2 (two) times daily as needed for anxiety. 60 tablet 5  . ferrous sulfate 325 (65 FE) MG EC tablet Take 325 mg by mouth daily.    . Fluticasone-Salmeterol (ADVAIR DISKUS) 100-50 MCG/DOSE AEPB Inhale 1 puff into the lungs every 12 (twelve) hours. Discontinue all previous refills for this medication. Hold/Fill when appropriate according to pt rx fill schedule. 180 each 3  . hydrALAZINE (APRESOLINE) 25 MG tablet TAKE 1 TABLET (25 MG TOTAL) BY MOUTH 3 (THREE) TIMES DAILY. 270 tablet 1  . isosorbide mononitrate (IMDUR) 60 MG 24 hr tablet TAKE 1 TABLET (60 MG TOTAL) BY MOUTH DAILY. 30 tablet 5  . levothyroxine (SYNTHROID, LEVOTHROID) 137 MCG tablet TAKE 1 TABLET BY MOUTH DAILY 30 tablet 5  . prednisoLONE acetate (PRED FORTE) 1 % ophthalmic suspension Place 1 drop into both eyes daily as needed. For eye pain    . timolol (TIMOPTIC) 0.5 % ophthalmic  solution Place 1 drop into both eyes 2 (two) times daily. Twice daily    . torsemide (DEMADEX) 20 MG tablet TAKE  1 TAB  EVERY OTHER  DAY (Patient taking differently: Take 20 mg by mouth daily. TAKE  1 TAB  EVERY OTHER  DAY) 15 tablet 6  . traMADol (ULTRAM) 50 MG tablet TAKE 1-2 TABLETS BY MOUTH 2 TIMES DAILY AS NEEDED FOR MODERATE OR SEVERE PAIN 100 tablet 2  . triamcinolone cream (KENALOG) 0.1 % Apply 1 application topically 2 (two) times daily. 90 g 3  . vitamin D, CHOLECALCIFEROL, 400 UNITS tablet Take 400 Units by mouth. Three days a week    . warfarin (COUMADIN) 3 MG tablet Take as directed by anticoagulation clinic 30 tablet 3  . predniSONE (DELTASONE) 1 MG tablet Prednisone: take a total of 9 mg/d x 1 mo, 8 mg/d x 1 month, 7 mg/d x 1 month 120 tablet 3  . predniSONE (DELTASONE) 5 MG tablet Take 1 tablet (5 mg total) by mouth daily with breakfast. Prednisone: take a total of 9 mg/d x 1 mo, 8 mg/d x 1 month, 7 mg/d x 1 month 90 tablet 3   No facility-administered medications prior to visit.    ROS Review of Systems  Constitutional: Negative for chills, activity change, appetite change, fatigue and unexpected weight change.  HENT: Negative  for congestion, mouth sores and sinus pressure.   Eyes: Negative for visual disturbance.  Respiratory: Negative for cough and chest tightness.   Gastrointestinal: Negative for nausea and abdominal pain.  Genitourinary: Negative for frequency, difficulty urinating and vaginal pain.  Musculoskeletal: Negative for back pain and gait problem.  Skin: Negative for pallor and rash.  Neurological: Negative for dizziness, tremors, weakness, numbness and headaches.  Psychiatric/Behavioral: Negative for suicidal ideas, confusion and sleep disturbance.    Objective:  BP 132/80 mmHg  Pulse 69  Temp(Src) 98.3 F (36.8 C) (Oral)  Resp 18  Ht 5\' 6"  (1.676 m)  Wt 224 lb (101.606 kg)  BMI 36.17 kg/m2  SpO2 92%  BP Readings from Last 3 Encounters:  04/12/15  132/80  01/21/15 143/70  01/14/15 118/78    Wt Readings from Last 3 Encounters:  04/12/15 224 lb (101.606 kg)  01/14/15 217 lb (98.431 kg)  11/20/14 217 lb (98.431 kg)    Physical Exam  Constitutional: She appears well-developed. No distress.  HENT:  Head: Normocephalic.  Right Ear: External ear normal.  Left Ear: External ear normal.  Nose: Nose normal.  Mouth/Throat: Oropharynx is clear and moist.  Eyes: Conjunctivae are normal. Pupils are equal, round, and reactive to light. Right eye exhibits no discharge. Left eye exhibits no discharge.  Neck: Normal range of motion. Neck supple. No JVD present. No tracheal deviation present. No thyromegaly present.  Cardiovascular: Normal rate, regular rhythm and normal heart sounds.   Pulmonary/Chest: No stridor. No respiratory distress. She has no wheezes.  Abdominal: Soft. Bowel sounds are normal. She exhibits no distension and no mass. There is no tenderness. There is no rebound and no guarding.  Musculoskeletal: She exhibits no edema or tenderness.  Lymphadenopathy:    She has no cervical adenopathy.  Neurological: She displays normal reflexes. No cranial nerve deficit. She exhibits normal muscle tone. Coordination normal.  Skin: No rash noted. No erythema.  Psychiatric: She has a normal mood and affect. Her behavior is normal. Judgment and thought content normal.    Lab Results  Component Value Date   WBC 9.1 11/07/2014   HGB 11.2* 11/07/2014   HCT 35.1* 11/07/2014   PLT 264.0 11/07/2014   GLUCOSE 122* 04/12/2015   CHOL 240* 11/17/2013   TRIG 269.0* 11/17/2013   HDL 32.30* 11/17/2013   LDLDIRECT 147.8 05/08/2013   LDLCALC 154* 11/17/2013   ALT 12 03/20/2014   AST 23 03/20/2014   NA 138 04/12/2015   K 4.3 04/12/2015   CL 106 04/12/2015   CREATININE 2.03* 04/12/2015   BUN 27* 04/12/2015   CO2 22 04/12/2015   TSH 3.34 11/07/2014   INR 2.1 04/09/2015   HGBA1C 6.4 04/12/2015   MICROALBUR 3.1* 09/27/2006    Dg Thoracic  Spine W/swimmers  01/21/2015  CLINICAL DATA:  Chronic back pain without known injury EXAM: THORACIC SPINE - 3 VIEWS COMPARISON:  PA and lateral chest x-ray of May 21, 2014 FINDINGS: There is gentle dextrocurvature centered at approximately T11. This is stable. The thoracic vertebral bodies are preserved in height. There is mild multilevel degenerative disc space narrowing. There are no abnormal paravertebral soft tissue densities. IMPRESSION: There is mild multilevel degenerative disc disease of the thoracic spine with gentle dextrocurvature centered at T11. There is no compression fracture. Electronically Signed   By: David  Martinique M.D.   On: 01/21/2015 15:19    Assessment & Plan:   Diagnoses and all orders for this visit:  Midline thoracic back pain  Need  for influenza vaccination -     Flu Vaccine QUAD 36+ mos IM  Other orders -     Discontinue: predniSONE (DELTASONE) 1 MG tablet; Prednisone: take a total 7 mg/d x 1 month, then 6 mg/d x 1 month, then 5 mg/day pc -     predniSONE (DELTASONE) 5 MG tablet; Take 1 tablet (5 mg total) by mouth daily with breakfast. Prednisone: take a total of 5 mg/d pc  I have discontinued Ms. Parveen's predniSONE and predniSONE. I have also changed her predniSONE. Additionally, I am having her maintain her prednisoLONE acetate, timolol, vitamin D (CHOLECALCIFEROL), brimonidine, calcium carbonate, albuterol, allopurinol, ferrous sulfate, triamcinolone cream, torsemide, buPROPion, hydrALAZINE, traMADol, acetaminophen, clorazepate, Fluticasone-Salmeterol, warfarin, levothyroxine, carvedilol, and isosorbide mononitrate.  Meds ordered this encounter  Medications  . DISCONTD: predniSONE (DELTASONE) 1 MG tablet    Sig: Prednisone: take a total 7 mg/d x 1 month, then 6 mg/d x 1 month, then 5 mg/day pc    Dispense:  120 tablet    Refill:  3  . predniSONE (DELTASONE) 5 MG tablet    Sig: Take 1 tablet (5 mg total) by mouth daily with breakfast. Prednisone:  take a total of 5 mg/d pc    Dispense:  90 tablet    Refill:  3     Follow-up: Return in about 3 months (around 07/13/2015) for a follow-up visit.  Walker Kehr, MD

## 2015-04-12 NOTE — Assessment & Plan Note (Signed)
MRI offered to r/o fx

## 2015-04-12 NOTE — Progress Notes (Signed)
Pre visit review using our clinic review tool, if applicable. No additional management support is needed unless otherwise documented below in the visit note. 

## 2015-04-14 NOTE — Assessment & Plan Note (Signed)
On Coumadin 

## 2015-04-14 NOTE — Assessment & Plan Note (Signed)
Hydralazine, Demadex

## 2015-04-14 NOTE — Assessment & Plan Note (Signed)
Statin intolerant 

## 2015-04-14 NOTE — Assessment & Plan Note (Signed)
now on 5 mg/d

## 2015-04-25 ENCOUNTER — Encounter: Payer: Self-pay | Admitting: Cardiovascular Disease

## 2015-04-25 NOTE — Progress Notes (Signed)
[name], female   DOB: 1935-11-20, 79 y.o.   MRN: [id number]   Wendy Cole is a 79 y.o. female with a history of heart failure NICM, PAF, OSA, HTN, prior DVT, renal insufficiency . Carotid duplex on 11/10/11 showed bilateral 80-99% ICA stenosis and she is post bilateral CEA;s She is normally seen by Wendy Cole and saw Wendy Cole in consultation at the hospital 11/18/11   for post op hypertension. She had a heart cath 07/13/11 One hospitalization 2013 for "flash pulmonary edema"    Echo 09/07/14  Study Conclusions  - Left ventricle: The cavity size was normal. Wall thickness was increased in a pattern of moderate LVH. Systolic function was normal. The estimated ejection fraction was in the range of 55% to 60%. Doppler parameters are consistent with both elevated ventricular end-diastolic filling pressure and elevated left atrial filling pressure. - Aortic valve: There was moderate stenosis. There was moderate regurgitation. Valve area (VTI): 1.05 cm^2. Valve area (Vmean): 0.99 cm^2. - Mitral valve: There was mild regurgitation. - Left atrium: The atrium was moderately dilated. - Atrial septum: No defect or patent foramen ovale was identified. - Pulmonary arteries: PA peak pressure: 60 mm Hg (S).   She is followed by Wendy Cole for anemia and breast cancer off arimidex Sees Wendy Cole for COPD OSA and previous smoker  Concerned about temporal arteritis  Family history of such and said ENT indicated diagnosis 2 years ago  Has headaches and blurry vision   Reviewed duplex 05/2014 previous CEA sights widely patent   Also increasing LE edema and dyspnea on chonic low dose prednisone for arthritis  ROS: Denies fever, malais, weight loss, blurry vision, decreased visual acuity, cough, sputum, SOB, hemoptysis, pleuritic pain, palpitaitons, heartburn, abdominal pain, melena, lower extremity edema, claudication, or rash.  All other systems reviewed and  negative  General: Affect appropriate Overweight white female  HEENT: normal Neck supple with no adenopathy post bilateral CEA;s  JVP normal left  bruits no thyromegaly Lungs clear with no wheezing and good diaphragmatic motion Heart:  S1/S2 AS  murmur, no rub, gallop or click PMI normal Abdomen: benighn, BS positve, no tenderness, no AAA no bruit.  No HSM or HJR Distal pulses intact with no bruits Plus 2  bilateral  edema Neuro non-focal Skin warm and dry No muscular weakness   Current Outpatient Prescriptions  Medication Sig Dispense Refill  . acetaminophen (TYLENOL) 500 MG tablet Take 500 mg by mouth daily as needed for mild pain, moderate pain or headache.     . albuterol (PROVENTIL HFA;VENTOLIN HFA) 108 (90 BASE) MCG/ACT inhaler Inhale 2 puffs into the lungs every 6 (six) hours as needed for wheezing or shortness of breath. 1 Inhaler 6  . allopurinol (ZYLOPRIM) 100 MG tablet TAKE 1 TABLET by mouth  three times a week    . brimonidine (ALPHAGAN) 0.15 % ophthalmic solution Place 1 drop into both eyes daily.     [name] buPROPion (WELLBUTRIN SR) 150 MG 12 hr tablet Take 150 mg by mouth daily.   3  . calcium carbonate (CALCIUM 500) 1250 MG tablet Take 1 tablet by mouth 3 (three) times a week.     . carvedilol (COREG) 12.5 MG tablet Take 2 tabs by mouth in the am & 1 tab by mouth in the pm    . clorazepate (TRANXENE) 7.5 MG tablet Take 1 tablet (7.5 mg total) by mouth 2 (two) times daily as needed for anxiety. 60 tablet 5  .  ferrous sulfate 325 (65 FE) MG EC tablet Take 325 mg by mouth 3 (three) times a week.     . Fluticasone-Salmeterol (ADVAIR DISKUS) 100-50 MCG/DOSE AEPB Inhale 1 puff into the lungs every 12 (twelve) hours. Discontinue all previous refills for this medication. Hold/Fill when appropriate according to pt rx fill schedule. 180 each 3  . isosorbide mononitrate (IMDUR) 60 MG 24 hr tablet TAKE 1 TABLET (60 MG TOTAL) BY MOUTH DAILY. 30 tablet 5  . levothyroxine (SYNTHROID,  LEVOTHROID) 137 MCG tablet TAKE 1 TABLET BY MOUTH DAILY 30 tablet 5  . prednisoLONE acetate (PRED FORTE) 1 % ophthalmic suspension Place 1 drop into both eyes daily as needed. For eye pain    . predniSONE (DELTASONE) 5 MG tablet Take 1 tablet (5 mg total) by mouth daily with breakfast. Prednisone: take a total of 5 mg/d pc 90 tablet 3  . timolol (TIMOPTIC) 0.5 % ophthalmic solution Place 1 drop into both eyes 2 (two) times daily.     [name] torsemide (DEMADEX) 20 MG tablet Take 20 mg by mouth daily as needed (for edema).    . traMADol (ULTRAM) 50 MG tablet TAKE 1-2 TABLETS BY MOUTH 2 TIMES DAILY AS NEEDED FOR MODERATE OR SEVERE PAIN 100 tablet 2  . vitamin D, CHOLECALCIFEROL, 400 UNITS tablet Take 400 Units by mouth 3 (three) times a week.     . warfarin (COUMADIN) 3 MG tablet Take as directed by anticoagulation clinic 30 tablet 3  . hydrALAZINE (APRESOLINE) 25 MG tablet TAKE 1 TABLET (25 MG TOTAL) BY MOUTH 3 (THREE) TIMES DAILY. 270 tablet 3  . torsemide (DEMADEX) 20 MG tablet TAKE  1 TAB BY MOUTH  EVERY OTHER  DAY 15 tablet 6  . triamcinolone cream (KENALOG) 0.1 % Apply 1 application topically 2 (two) times daily. 90 g 3   No current facility-administered medications for this visit.    Allergies  Amlodipine; Atorvastatin; Colesevelam; Coumadin; Lasix; Statins; and Tape  Electrocardiogram: 2013  SR possible old IMI rate60  01/2014  SB rate 56 LVH LAD  04/26/15 SR rate 66 LAD no change   Assessment and Plan CAD:  Stable with no angina and good activity level.  Continue medical Rx AS:  Moderate by echo 08/2014  Will repeat to check EF and gradient given dyspnea  Headache:  ? Temporal arteritis  F/u neuro on low dose prednisone check ESR Carotids:  Duplex 05/2014  no restenosis post CEA bilateral ASA  Dyspnea:  Increase demedex to 20 bid add Kdur 20  CXR , echo and BNP, BMET  F/U with me next available   [employer]

## 2015-04-26 ENCOUNTER — Ambulatory Visit (INDEPENDENT_AMBULATORY_CARE_PROVIDER_SITE_OTHER): Payer: Medicare Other | Admitting: Cardiovascular Disease

## 2015-04-26 ENCOUNTER — Encounter: Payer: Self-pay | Admitting: Cardiovascular Disease

## 2015-04-26 VITALS — BP 120/64 | HR 66 | Ht 66.0 in | Wt 224.4 lb

## 2015-04-26 DIAGNOSIS — I10 Essential (primary) hypertension: Secondary | ICD-10-CM

## 2015-04-26 DIAGNOSIS — I5022 Chronic systolic (congestive) heart failure: Secondary | ICD-10-CM | POA: Diagnosis not present

## 2015-04-26 DIAGNOSIS — I639 Cerebral infarction, unspecified: Secondary | ICD-10-CM

## 2015-04-26 DIAGNOSIS — R040 Epistaxis: Secondary | ICD-10-CM

## 2015-04-26 DIAGNOSIS — R0600 Dyspnea, unspecified: Secondary | ICD-10-CM

## 2015-04-26 DIAGNOSIS — I152 Hypertension secondary to endocrine disorders: Secondary | ICD-10-CM

## 2015-04-26 DIAGNOSIS — E1159 Type 2 diabetes mellitus with other circulatory complications: Secondary | ICD-10-CM | POA: Diagnosis not present

## 2015-04-26 DIAGNOSIS — R609 Edema, unspecified: Secondary | ICD-10-CM

## 2015-04-26 DIAGNOSIS — Z79899 Other long term (current) drug therapy: Secondary | ICD-10-CM

## 2015-04-26 MED ORDER — TORSEMIDE 20 MG PO TABS: ORAL_TABLET | ORAL | Status: DC

## 2015-04-26 MED ORDER — HYDRALAZINE HCL 25 MG PO TABS: ORAL_TABLET | ORAL | Status: DC

## 2015-04-26 MED ORDER — POTASSIUM CHLORIDE CRYS ER 20 MEQ PO TBCR: 20.0000 meq | EXTENDED_RELEASE_TABLET | Freq: Every day | ORAL | Status: DC

## 2015-04-26 NOTE — Patient Instructions (Addendum)
Medication Instructions:  INCREASE  TORSEMIDE  20 MG  TWICE DAILY  AND  START  KCL  20 MEQ DAILY  Testing/Procedures:Your physician has requested that you have an echocardiogram. Echocardiography is a painless test that uses sound waves to create images of your heart. It provides your doctor with information about the size and shape of your heart and how well your heart's chambers and valves are working. This procedure takes approximately one hour. There are no restrictions for this procedure.   A chest x-ray takes a picture of the organs and structures inside the chest, including the heart, lungs, and blood vessels. This test can show several things, including, whether the heart is enlarges; whether fluid is building up in the lungs; and whether pacemaker / defibrillator leads are still in place.  TODAY   BMET  BNP  AND   SED RATE   Follow-Up: Your physician recommends that you schedule a follow-up appointment in: [place] [facility]   If you need a refill on your cardiac medications before your next appointment, please call your pharmacy.

## 2015-04-26 NOTE — Addendum Note (Signed)
Addended by: Devra Dopp E on: 04/26/2015 05:01 PM   Modules accepted: Orders

## 2015-04-27 LAB — BASIC METABOLIC PANEL
BUN: 27 mg/dL — ABNORMAL HIGH (ref 7–25)
CALCIUM: 8.8 mg/dL (ref 8.6–10.4)
CO2: 20 mmol/L (ref 20–31)
CREATININE: 1.95 mg/dL — AB (ref 0.60–0.93)
Chloride: 105 mmol/L (ref 98–110)
Glucose, Bld: 91 mg/dL (ref 65–99)
Potassium: 4.4 mmol/L (ref 3.5–5.3)
SODIUM: 137 mmol/L (ref 135–146)

## 2015-04-27 LAB — BRAIN NATRIURETIC PEPTIDE: BRAIN NATRIURETIC PEPTIDE: 3957.1 pg/mL — AB (ref 0.0–100.0)

## 2015-04-27 LAB — SEDIMENTATION RATE: SED RATE: 9 mm/h (ref 0–30)

## 2015-05-02 ENCOUNTER — Other Ambulatory Visit (HOSPITAL_BASED_OUTPATIENT_CLINIC_OR_DEPARTMENT_OTHER): Payer: Medicare Other

## 2015-05-02 ENCOUNTER — Ambulatory Visit (HOSPITAL_BASED_OUTPATIENT_CLINIC_OR_DEPARTMENT_OTHER): Payer: Medicare Other | Admitting: Oncology

## 2015-05-02 ENCOUNTER — Telehealth: Payer: Self-pay | Admitting: Oncology

## 2015-05-02 VITALS — BP 168/55 | HR 61 | Temp 97.5°F | Resp 18 | Ht 66.0 in | Wt 219.3 lb

## 2015-05-02 DIAGNOSIS — I639 Cerebral infarction, unspecified: Secondary | ICD-10-CM

## 2015-05-02 DIAGNOSIS — D649 Anemia, unspecified: Secondary | ICD-10-CM

## 2015-05-02 DIAGNOSIS — Z853 Personal history of malignant neoplasm of breast: Secondary | ICD-10-CM | POA: Diagnosis not present

## 2015-05-02 LAB — CBC WITH DIFFERENTIAL/PLATELET
BASO%: 1.2 % (ref 0.0–2.0)
BASOS ABS: 0.1 10*3/uL (ref 0.0–0.1)
EOS%: 1.7 % (ref 0.0–7.0)
Eosinophils Absolute: 0.1 10*3/uL (ref 0.0–0.5)
HEMATOCRIT: 32.1 % — AB (ref 34.8–46.6)
HEMOGLOBIN: 9.9 g/dL — AB (ref 11.6–15.9)
LYMPH#: 0.5 10*3/uL — AB (ref 0.9–3.3)
LYMPH%: 6.8 % — ABNORMAL LOW (ref 14.0–49.7)
MCH: 24.9 pg — ABNORMAL LOW (ref 25.1–34.0)
MCHC: 30.9 g/dL — ABNORMAL LOW (ref 31.5–36.0)
MCV: 80.8 fL (ref 79.5–101.0)
MONO#: 0.5 10*3/uL (ref 0.1–0.9)
MONO%: 7.3 % (ref 0.0–14.0)
NEUT%: 83 % — ABNORMAL HIGH (ref 38.4–76.8)
NEUTROS ABS: 5.8 10*3/uL (ref 1.5–6.5)
Platelets: 294 10*3/uL (ref 145–400)
RBC: 3.98 10*6/uL (ref 3.70–5.45)
RDW: 17.8 % — AB (ref 11.2–14.5)
WBC: 7 10*3/uL (ref 3.9–10.3)

## 2015-05-02 NOTE — Telephone Encounter (Signed)
Gave and printed appt sched and avs for pt for Jan and Aug

## 2015-05-02 NOTE — Progress Notes (Signed)
  [facility] OFFICE PROGRESS NOTE   Diagnosis: Breast cancer, anemia  INTERVAL HISTORY:   She returns as scheduled. She continues Coumadin anticoagulation, followed at the [place] [facility]. She denies bleeding. She is not taking iron. She reports discomfort in the right lower back.  Objective:  Vital signs in last 24 hours:  Blood pressure 168/55, pulse 61, temperature 97.5 F (36.4 C), temperature source Oral, resp. rate 18, height $RemoveBe'5\' 6"'THISrYmRo$  (1.676 m), weight 219 lb 4.8 oz (99.474 kg), SpO2 97 %.    HEENT: Neck without mass Lymphatics: No cervical, supraclavicular, or axillary nodes Resp: Lungs clear bilaterally Cardio: Regular rate and rhythm GI: No hepatosplenomegaly, nontender Vascular: Pitting edema in both legs Breasts: Status post right mastectomy. No evidence for chest wall tumor recurrence. Left breast without mass.  Musculoskeletal: Mild tenderness at the right iliac area.  Lab Results:  Lab Results  Component Value Date   WBC 7.0 05/02/2015   HGB 9.9* 05/02/2015   HCT 32.1* 05/02/2015   MCV 80.8 05/02/2015   PLT 294 05/02/2015   NEUTROABS 5.8 05/02/2015    Blood smear: White cell morphology is unremarkable, the platelet normal in number. There is moderate variation in red cell size. There are a few ovalocytes, teardrops, and target cells. The red cells are hypochromic   Medications: I have reviewed the patient's current medications.  Assessment/Plan: 1. Extensive left lower extremity deep vein thrombosis. Maintained on anticoagulation therapy since she was diagnosed with a deep venous thrombosis, 01/31/2009.  1. She was treated with Lovenox followed by heparin anticoagulation while hospitalized, and she was then maintained on Lovenox prior to surgery on 03/02/2009.  2. Now maintained on therapeutic Coumadin anticoagulation.  2. Diagnosed with synchronous T1, ER positive, PR positive, and HER-2 negative right-sided breast cancer on  01/09/2009. 1. Status post a right mastectomy and sentinel lymph node biopsy on 03/22/2009 with the pathology confirming two foci of invasive carcinoma measuring 0.6 and 0.4 cm. The invasive tumor was grade 1 with associated low-grade ductal carcinoma in situ. No lymphovascular invasion was identified, and three right-sided sentinel lymph nodes were negative for metastatic carcinoma.  2. Initiation of adjuvant Arimidex on 04/05/2009.completed at the end of October 2015. 3. Chronic obstructive pulmonary disease. 4. History of a cerebrovascular accident. 5. Gout. 6. Obstructive sleep apnea. 7. History of renal insufficiency. 8. Anemia-severe microcytic anemia noted on 07/20/2012. Question secondary to iron deficiency or renal insufficiency. Stool Hemoccult positive x3 on 06/21/2013.the hemoglobin is lower today and the blood smear/hematologic indices are consistent with iron deficiency 9. Admission with "flash "pulmonary edema in February of 2013. 10. Left carotid endarterectomy June 2013, right carotid endarterectomy August 2013     Disposition:  She is more anemic. I suspect she has iron deficiency. She is not taking iron. I recommend that she will resume ferrous sulfate twice daily. She will return for a CBC in one month. She declines a GI evaluation. She will return for an office visit in 8 months. Ms. Ekstein remains in clinical remission from breast cancer. She will follow-up with Dr. Silvio Pate if the back pain does not improve.   Betsy Coder, MD  05/02/2015  12:59 PM

## 2015-05-07 ENCOUNTER — Ambulatory Visit (INDEPENDENT_AMBULATORY_CARE_PROVIDER_SITE_OTHER): Payer: Medicare Other | Admitting: General Practice

## 2015-05-07 DIAGNOSIS — Z86718 Personal history of other venous thrombosis and embolism: Secondary | ICD-10-CM | POA: Diagnosis not present

## 2015-05-07 DIAGNOSIS — I82509 Chronic embolism and thrombosis of unspecified deep veins of unspecified lower extremity: Secondary | ICD-10-CM | POA: Diagnosis not present

## 2015-05-07 DIAGNOSIS — I82402 Acute embolism and thrombosis of unspecified deep veins of left lower extremity: Secondary | ICD-10-CM | POA: Diagnosis not present

## 2015-05-07 DIAGNOSIS — Z7901 Long term (current) use of anticoagulants: Secondary | ICD-10-CM

## 2015-05-07 LAB — POCT INR: INR: 3

## 2015-05-07 NOTE — Progress Notes (Signed)
I have reviewed and agree with the plan. 

## 2015-05-07 NOTE — Progress Notes (Signed)
Pre visit review using our clinic review tool, if applicable. No additional management support is needed unless otherwise documented below in the visit note. 

## 2015-05-10 ENCOUNTER — Other Ambulatory Visit (HOSPITAL_COMMUNITY): Payer: Medicare Other

## 2015-05-20 ENCOUNTER — Other Ambulatory Visit: Payer: Self-pay | Admitting: Cardiovascular Disease

## 2015-05-30 ENCOUNTER — Other Ambulatory Visit: Payer: Medicare Other

## 2015-05-31 ENCOUNTER — Encounter: Payer: Self-pay | Admitting: Family

## 2015-06-04 ENCOUNTER — Ambulatory Visit: Payer: Medicare Other

## 2015-06-06 ENCOUNTER — Other Ambulatory Visit: Payer: Self-pay | Admitting: Internal Medicine

## 2015-06-07 ENCOUNTER — Ambulatory Visit (INDEPENDENT_AMBULATORY_CARE_PROVIDER_SITE_OTHER): Payer: Medicare Other | Admitting: Family

## 2015-06-07 ENCOUNTER — Ambulatory Visit (HOSPITAL_COMMUNITY)
Admission: RE | Admit: 2015-06-07 | Discharge: 2015-06-07 | Disposition: A | Discharge status: Home / Self Care | Payer: Medicare Other | Source: Ambulatory Visit | Attending: Family | Admitting: Family

## 2015-06-07 ENCOUNTER — Encounter: Payer: Self-pay | Admitting: Family

## 2015-06-07 VITALS — BP 138/60 | HR 57 | Temp 98.3°F | Resp 14 | Ht 66.0 in | Wt 216.0 lb

## 2015-06-07 DIAGNOSIS — Z86718 Personal history of other venous thrombosis and embolism: Secondary | ICD-10-CM | POA: Diagnosis not present

## 2015-06-07 DIAGNOSIS — R51 Headache: Secondary | ICD-10-CM

## 2015-06-07 DIAGNOSIS — I6523 Occlusion and stenosis of bilateral carotid arteries: Secondary | ICD-10-CM

## 2015-06-07 DIAGNOSIS — Z48812 Encounter for surgical aftercare following surgery on the circulatory system: Secondary | ICD-10-CM | POA: Diagnosis present

## 2015-06-07 DIAGNOSIS — R519 Headache, unspecified: Secondary | ICD-10-CM

## 2015-06-07 DIAGNOSIS — Z9889 Other specified postprocedural states: Secondary | ICD-10-CM | POA: Diagnosis not present

## 2015-06-07 NOTE — Patient Instructions (Signed)
Stroke Prevention Some medical conditions and behaviors are associated with an increased chance of having a stroke. You may prevent a stroke by making healthy choices and managing medical conditions. HOW CAN I REDUCE MY RISK OF HAVING A STROKE?   Stay physically active. Get at least 30 minutes of activity on most or all days.  Do not smoke. It may also be helpful to avoid exposure to secondhand smoke.  Limit alcohol use. Moderate alcohol use is considered to be:  No more than 2 drinks per day for men.  No more than 1 drink per day for nonpregnant women.  Eat healthy foods. This involves:  Eating 5 or more servings of fruits and vegetables a day.  Making dietary changes that address high blood pressure (hypertension), high cholesterol, diabetes, or obesity.  Manage your cholesterol levels.  Making food choices that are high in fiber and low in saturated fat, trans fat, and cholesterol may control cholesterol levels.  Take any prescribed medicines to control cholesterol as directed by your health care provider.  Manage your diabetes.  Controlling your carbohydrate and sugar intake is recommended to manage diabetes.  Take any prescribed medicines to control diabetes as directed by your health care provider.  Control your hypertension.  Making food choices that are low in salt (sodium), saturated fat, trans fat, and cholesterol is recommended to manage hypertension.  Ask your health care provider if you need treatment to lower your blood pressure. Take any prescribed medicines to control hypertension as directed by your health care provider.  If you are 18-39 years of age, have your blood pressure checked every 3-5 years. If you are 40 years of age or older, have your blood pressure checked every year.  Maintain a healthy weight.  Reducing calorie intake and making food choices that are low in sodium, saturated fat, trans fat, and cholesterol are recommended to manage  weight.  Stop drug abuse.  Avoid taking birth control pills.  Talk to your health care provider about the risks of taking birth control pills if you are over 35 years old, smoke, get migraines, or have ever had a blood clot.  Get evaluated for sleep disorders (sleep apnea).  Talk to your health care provider about getting a sleep evaluation if you snore a lot or have excessive sleepiness.  Take medicines only as directed by your health care provider.  For some people, aspirin or blood thinners (anticoagulants) are helpful in reducing the risk of forming abnormal blood clots that can lead to stroke. If you have the irregular heart rhythm of atrial fibrillation, you should be on a blood thinner unless there is a good reason you cannot take them.  Understand all your medicine instructions.  Make sure that other conditions (such as anemia or atherosclerosis) are addressed. SEEK IMMEDIATE MEDICAL CARE IF:   You have sudden weakness or numbness of the face, arm, or leg, especially on one side of the body.  Your face or eyelid droops to one side.  You have sudden confusion.  You have trouble speaking (aphasia) or understanding.  You have sudden trouble seeing in one or both eyes.  You have sudden trouble walking.  You have dizziness.  You have a loss of balance or coordination.  You have a sudden, severe headache with no known cause.  You have new chest pain or an irregular heartbeat. Any of these symptoms may represent a serious problem that is an emergency. Do not wait to see if the symptoms will   go away. Get medical help at once. Call your local emergency services (911 in [place] Do not drive yourself to the hospital.   This information is not intended to replace advice given to you by your health care provider. Make sure you discuss any questions you have with your health care provider.   Document Released: 06/18/2004 Document Revised: 06/01/2014 Document Reviewed:  11/11/2012 [employer] Interactive Patient Education 2016 [employer]  

## 2015-06-07 NOTE — Progress Notes (Signed)
Chief Complaint: Extracranial Carotid Artery Stenosis   History of Present Illness  Wendy Cole is a 80 y.o. female patient of Dr. Bridgett Larsson who is s/p R CEA (01/12/12) and L CEA (11/19/11). The patient has had no stroke sx since 2004. She also had a stroke in the 1990's with a left temporal lobe stroke. With both strokes she had expressive aphasia. She has a history of previous DVT in left leg.  She reports low back pain, reports discussing this with he PCP. Her walking is limited by dyspnea and low back pain. She has COPD, also has OSA, uses CPAP.  Venous Duplex at her January 2015 visit was negative for acute DVT, she did have chronic DVT and remains on coumadin. Does not wear compression hose, left lower legs varicose veins hurt if she wears knee high compression hose.  Dr. Johnsie Cancel is her cardiologist, she is scheduled to see him 06/27/15, Dr. Annamaria Boots is her pulmonologist. She states that her PCP manages her coumadin. States she has never had IVC filter placed. 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  She started having intermittent alternating hemispheric headaches about 2 years ago, progressively worsening. Pt spoke to an ENT about this, but pt states no diagnosis offerred. Pt states her sister died with sx's similar to this; another sister had a brain aneurysm.  She indicates significant concern re this.   Patient has Positive history of TIA or stroke symptom in the 1990's as manifested by slurred speech, loss of some degree of balance; she denies a history of amaurosis fugax or monocular blindness, denies unilateral facial drooping.  Pt Diabetic: No Pt smoker: former smoker, quit in 2004  Pt meds include: Statin : No: cannot tolerate due to severe myalgias ASA: No: Other anticoagulants/antiplatelets: warfarin  Past Medical History  Diagnosis Date  . Anemia   . Anxiety states   . Depressive disorder, not elsewhere classified   . Type II or unspecified type diabetes  mellitus without mention of complication, not stated as uncontrolled   . Hyperlipidemia   . Unspecified essential hypertension   . Renal insufficiency   . Osteoarthritis   . Gout   . Iritis   . Nonischemic cardiomyopathy (HCC)     EF 20 to 25% per echo 06/2011  . Chronic anticoagulation   . NSTEMI (non-ST elevated myocardial infarction) [facility]) Feb 2013    06/2011 cath  Mild nonobstructive disease. No LV gram  . PAF (paroxysmal atrial fibrillation) ([place])   . Obesity   . Cancer [facility]) 2010    Right breast  . DVT (deep venous thrombosis) ([place]) 2010  . Stroke [facility]) 2004    Left upper lobe  . Thyroid disease 1969    Three fourth of Thyroid removed  . Complication of anesthesia     "fights it", for colonoscopy  . Flash pulmonary edema ([place])     06/2011  . HOH (hard of hearing)     deaf in L completely  . OSA on CPAP     CPAP at night  . GERD (gastroesophageal reflux disease)   . Hypothyroidism   . Chronic kidney disease (CKD), stage III (moderate)   . Peripheral vascular disease ([place]) Jan. 2015    Left leg pain and swelling    Social History Social History  Substance Use Topics  . Smoking status: Former Smoker -- 2.00 packs/day for 40 years    Types: Cigarettes    Quit date: 05/25/2002  . Smokeless tobacco: Never Used  .  Alcohol Use: No    Family History Family History  Problem Relation Age of Onset  . Hypertension    . Heart disease Mother 69    CHF  . Cancer Mother     breast  . Heart attack Mother   . Heart disease Father 59    MI  . Heart disease Brother     AAA  . Coronary artery disease Son 40    2 Stents    Surgical History Past Surgical History  Procedure Laterality Date  . Breast lumpectomy    . Cataract extraction    . Foot surgery    . Tonsillectomy  1949  . Abdominal hysterectomy  1974  . Eye surgery  2008    Glaucoma shunt Right eye  . Parotid gland tumor excision  2000  . Cystectomy  1974    Left Breast, chin, left of groin area  .  Thyroidectomy, partial  1969  . Endarterectomy  11/18/2011    Procedure: ENDARTERECTOMY CAROTID;  Surgeon: Conrad Bethlehem, MD;  Location: [place];  Service: Vascular;  Laterality: Left;  [name] Mastectomy  right  . Mastectomy    . Cardiac catheterization      no stent  . Colonoscopy w/ polypectomy    . Endarterectomy  01/12/2012    Procedure: ENDARTERECTOMY CAROTID;  Surgeon: Conrad Chester Hill, MD;  Location: [facility] OR;  Service: Vascular;  Laterality: Right;  Right carotid endarterectomy with 1cm x 6cm vascuguard patch angioplasty.  . Carotid endarterectomy Left 11-18-11    cea  . Carotid endarterectomy Right 01-12-12    cea  . Left and right heart catheterization with coronary/graft angiogram Left 07/20/2011    Procedure: LEFT AND RIGHT HEART CATHETERIZATION WITH Beatrix Fetters;  Surgeon: Peter M Martinique, MD;  Location: [facility] CATH LAB;  Service: Cardiovascular;  Laterality: Left;    Allergies  Allergen Reactions  . Amlodipine     edema  . Atorvastatin Other (See Comments)    Muscle aches  . Colesevelam Other (See Comments)    unknown  . Coumadin [Warfarin Sodium]     "causes cramps" brand name only per pt  . Lasix [Furosemide] Other (See Comments)    Muscle cramps  . Statins Other (See Comments)    Muscle aches  . Tape Rash    Current Outpatient Prescriptions  Medication Sig Dispense Refill  . hydrALAZINE (APRESOLINE) 25 MG tablet TAKE 1 TABLET (25 MG TOTAL) BY MOUTH 3 (THREE) TIMES DAILY. 270 tablet 3  . isosorbide mononitrate (IMDUR) 60 MG 24 hr tablet TAKE 1 TABLET (60 MG TOTAL) BY MOUTH DAILY. 30 tablet 5  . prednisoLONE acetate (PRED FORTE) 1 % ophthalmic suspension Place 1 drop into both eyes as needed. For eye pain    . warfarin (COUMADIN) 3 MG tablet TAKE AS DIRECTED BY ANTICOAGULATION CLINIC 30 tablet 3  . acetaminophen (TYLENOL) 500 MG tablet Take 500 mg by mouth daily as needed for mild pain, moderate pain or headache.     . albuterol (PROVENTIL HFA;VENTOLIN HFA) 108 (90 BASE)  MCG/ACT inhaler Inhale 2 puffs into the lungs every 6 (six) hours as needed for wheezing or shortness of breath. 1 Inhaler 6  . allopurinol (ZYLOPRIM) 100 MG tablet TAKE 1 TABLET by mouth  three times a week    . brimonidine (ALPHAGAN) 0.15 % ophthalmic solution Place 1 drop into both eyes daily.     [name] buPROPion (WELLBUTRIN SR) 150 MG 12 hr tablet Take 150 mg by mouth daily.  3  . calcium carbonate (CALCIUM 500) 1250 MG tablet Take 1 tablet by mouth 3 (three) times a week.     . carvedilol (COREG) 12.5 MG tablet Take 2 tabs by mouth in the am & 1 tab by mouth in the pm    . clorazepate (TRANXENE) 7.5 MG tablet Take 1 tablet (7.5 mg total) by mouth 2 (two) times daily as needed for anxiety. 60 tablet 5  . ferrous sulfate 325 (65 FE) MG EC tablet Take 325 mg by mouth 3 (three) times a week.     . Fluticasone-Salmeterol (ADVAIR DISKUS) 100-50 MCG/DOSE AEPB Inhale 1 puff into the lungs every 12 (twelve) hours. Discontinue all previous refills for this medication. Hold/Fill when appropriate according to pt rx fill schedule. 180 each 3  . isosorbide mononitrate (IMDUR) 60 MG 24 hr tablet TAKE 1 TABLET (60 MG TOTAL) BY MOUTH DAILY. (Patient not taking: Reported on 06/07/2015) 30 tablet 5  . levothyroxine (SYNTHROID, LEVOTHROID) 137 MCG tablet TAKE 1 TABLET BY MOUTH DAILY 30 tablet 5  . potassium chloride SA (K-DUR,KLOR-CON) 20 MEQ tablet Take 1 tablet (20 mEq total) by mouth daily. (Patient not taking: Reported on 05/02/2015) 90 tablet 3  . predniSONE (DELTASONE) 5 MG tablet Take 1 tablet (5 mg total) by mouth daily with breakfast. Prednisone: take a total of 5 mg/d pc 90 tablet 3  . timolol (TIMOPTIC) 0.5 % ophthalmic solution Place 1 drop into both eyes 2 (two) times daily.     [name] torsemide (DEMADEX) 20 MG tablet Take 20 mg by mouth daily as needed (for edema).    . traMADol (ULTRAM) 50 MG tablet TAKE 1-2 TABLETS BY MOUTH 2 TIMES DAILY AS NEEDED FOR MODERATE OR SEVERE PAIN 100 tablet 2  . triamcinolone  cream (KENALOG) 0.1 % Apply 1 application topically 2 (two) times daily. 90 g 3  . vitamin D, CHOLECALCIFEROL, 400 UNITS tablet Take 400 Units by mouth 3 (three) times a week.      No current facility-administered medications for this visit.    Review of Systems : See HPI for pertinent positives and negatives.  Physical Examination  Filed Vitals:   06/07/15 1351 06/07/15 1403  BP: 150/67 138/60  Pulse: 56 57  Temp: 98.3 F (36.8 C)   TempSrc: Oral   Resp: 14   Height: 5\' 6"  (1.676 m)   Weight: 216 lb (97.977 kg)   SpO2: 93%    Body mass index is 34.88 kg/(m^2).  General: WDWN obese female in NAD GAIT: slow and deliberate, using a cane Eyes: pupils are equal but do not constrict to light Pulmonary: CTAB, no rales,  rhonchi, & Negative wheezing.  Cardiac: regular rhythm, + murmur.  VASCULAR EXAM Carotid Bruits Left Right   Negative Negative   Aorta is not palpable. Radial pulses are 2+ palpable and equal.      LE Pulses LEFT RIGHT   FEMORAL not palpable, possibly due to body habitus, obesity  not palpable, possibly due to body habitus, obesity     POPLITEAL not palpable  not palpable   POSTERIOR TIBIAL 2+ palpable  2+ palpable    DORSALIS PEDIS  ANTERIOR TIBIAL not palpable  not palpable     Gastrointestinal: soft, nontender, BS WNL, no r/g, no palpable masses.  Musculoskeletal: Negative muscle atrophy/wasting. M/S 5/5 throughout, Extremities without ischemic changes. Both lower legs are equal in size, 2-3+ pitting edema in both lower legs, painful to touch, no red streaking.   Neurologic: A&O X 3;  Appropriate Affect, Speech is normal CN 2-12 intact except is slightly hard of hearing,  light touch intact in extremities, Motor exam as listed above.           Non-Invasive Vascular Imaging CAROTID DUPLEX 06/07/2015   Widely patent bilateral carotid endarterectomy sites with no evidence of restenosis. No significant change compared to prior exam.   Assessment: PATTE KARPEL is a 80 y.o. female who is s/p R CEA (01/12/12) and L CEA (11/19/11). The patient has had no stroke sx since 2004. She also had a stroke in the 1990's with a left temporal lobe stroke. With both strokes she had expressive aphasia. Today's carotid duplex suggests widely patent bilateral carotid endarterectomy sites with no evidence of restenosis. No significant change compared to prior exam.   Worsening and more frequent alternating hemispheric headaches with dizziness over the last 2 years are of significant concern to her. She had a negative ENT work up for this. Her sister died with sx's similar to this; another sister had a brain aneurysm.  Her pupils are equal but do no constrict to light. Will refer to neurology.   Plan:  Referral to neurologist for evaluation and management of unilateral headaches and dizziness.   Follow-up in 1 year with Carotid Duplex scan.   I discussed in depth with the patient the nature of atherosclerosis, and emphasized the importance of maximal medical management including strict control of blood pressure, blood glucose, and lipid levels, obtaining regular exercise, and continued cessation of smoking.  The patient is aware that without maximal medical management the underlying atherosclerotic disease process will progress, limiting the benefit of any interventions. The patient was given information about stroke prevention and what symptoms should prompt the patient to seek immediate medical care. Thank you for allowing [place] to participate in this patient's care.  Clemon Chambers, RN, MSN, FNP-C [facility] Office: [phone]  Clinic Physician: Scot Dock  06/07/2015 2:10 PM

## 2015-06-07 NOTE — Progress Notes (Signed)
Filed Vitals:   06/07/15 1351 06/07/15 1403  BP: 150/67 138/60  Pulse: 56 57  Temp: 98.3 F (36.8 C)   TempSrc: Oral   Resp: 14   Height: 5\' 6"  (1.676 m)   Weight: 216 lb (97.977 kg)   SpO2: 93%

## 2015-06-11 ENCOUNTER — Ambulatory Visit (INDEPENDENT_AMBULATORY_CARE_PROVIDER_SITE_OTHER): Payer: Medicare Other | Admitting: General Practice

## 2015-06-11 DIAGNOSIS — I82402 Acute embolism and thrombosis of unspecified deep veins of left lower extremity: Secondary | ICD-10-CM

## 2015-06-11 DIAGNOSIS — I480 Paroxysmal atrial fibrillation: Secondary | ICD-10-CM

## 2015-06-11 DIAGNOSIS — Z86718 Personal history of other venous thrombosis and embolism: Secondary | ICD-10-CM

## 2015-06-11 DIAGNOSIS — Z7901 Long term (current) use of anticoagulants: Secondary | ICD-10-CM

## 2015-06-11 DIAGNOSIS — Z5181 Encounter for therapeutic drug level monitoring: Secondary | ICD-10-CM

## 2015-06-11 DIAGNOSIS — I82509 Chronic embolism and thrombosis of unspecified deep veins of unspecified lower extremity: Secondary | ICD-10-CM

## 2015-06-11 LAB — POCT INR: INR: 2.7

## 2015-06-11 NOTE — Progress Notes (Signed)
Pre visit review using our clinic review tool, if applicable. No additional management support is needed unless otherwise documented below in the visit note. 

## 2015-06-11 NOTE — Progress Notes (Signed)
I have reviewed and agree with the plan. 

## 2015-06-14 ENCOUNTER — Encounter: Payer: Self-pay | Admitting: *Deleted

## 2015-06-20 ENCOUNTER — Other Ambulatory Visit: Payer: Self-pay | Admitting: Internal Medicine

## 2015-06-24 NOTE — Progress Notes (Signed)
[name], female   DOB: Sep 27, 1935, 80 y.o.   MRN: [id number]   Wendy Cole is a 80 y.o. female with a history of heart failure NICM, PAF, OSA, HTN, prior DVT, renal insufficiency . She is post bilateral CEA;s She is normally seen by Dr Martinique and saw Dr Harrington Challenger in consultation at the hospital 11/18/11   for post op hypertension. She had a heart cath 07/13/11 One hospitalization 2013 for "flash pulmonary edema"    Echo 09/07/14  Study Conclusions  - Left ventricle: The cavity size was normal. Wall thickness was increased in a pattern of moderate LVH. Systolic function was normal. The estimated ejection fraction was in the range of 55% to 60%. Doppler parameters are consistent with both elevated ventricular end-diastolic filling pressure and elevated left atrial filling pressure. - Aortic valve: There was moderate stenosis. There was moderate regurgitation. Valve area (VTI): 1.05 cm^2. Valve area (Vmean): 0.99 cm^2. - Mitral valve: There was mild regurgitation. - Left atrium: The atrium was moderately dilated. - Atrial septum: No defect or patent foramen ovale was identified. - Pulmonary arteries: PA peak pressure: 60 mm Hg (S).   She is followed by Dr Penni Homans for anemia and breast cancer off arimidex Sees Dr Annamaria Boots for COPD OSA and previous smoker  Concerned about temporal arteritis  Family history of such and said ENT indicated diagnosis 2 years ago  Has headaches and blurry vision   Reviewed duplex 05/2015 previous CEA sights widely patent   Also increasing LE edema and dyspnea on chonic low dose prednisone for arthritis Last visit demedex increased BNP over 3000   ROS: Denies fever, malais, weight loss, blurry vision, decreased visual acuity, cough, sputum, SOB, hemoptysis, pleuritic pain, palpitaitons, heartburn, abdominal pain, melena, lower extremity edema, claudication, or rash.  All other systems reviewed and negative  General: Affect  appropriate Overweight white female  HEENT: normal Neck supple with no adenopathy post bilateral CEA;s  JVP normal left  bruits no thyromegaly Lungs clear with no wheezing and good diaphragmatic motion Heart:  S1/S2 AS  murmur, no rub, gallop or click PMI normal Abdomen: benighn, BS positve, no tenderness, no AAA no bruit.  No HSM or HJR Distal pulses intact with no bruits Plus 2  bilateral  edema Neuro non-focal Skin warm and dry No muscular weakness   Current Outpatient Prescriptions  Medication Sig Dispense Refill  . acetaminophen (TYLENOL) 500 MG tablet Take 500 mg by mouth daily as needed for mild pain, moderate pain or headache.     . albuterol (PROVENTIL HFA;VENTOLIN HFA) 108 (90 BASE) MCG/ACT inhaler Inhale 2 puffs into the lungs every 6 (six) hours as needed for wheezing or shortness of breath. 1 Inhaler 6  . allopurinol (ZYLOPRIM) 100 MG tablet TAKE 1 TABLET by mouth  three times a week    . brimonidine (ALPHAGAN) 0.15 % ophthalmic solution Place 1 drop into both eyes daily.     [name] buPROPion (WELLBUTRIN SR) 150 MG 12 hr tablet Take 150 mg by mouth daily.   3  . calcium carbonate (CALCIUM 500) 1250 MG tablet Take 1 tablet by mouth 3 (three) times a week.     . carvedilol (COREG) 12.5 MG tablet Take 2 tabs by mouth in the am & 1 tab by mouth in the pm    . clorazepate (TRANXENE) 7.5 MG tablet Take 1 tablet (7.5 mg total) by mouth 2 (two) times daily as needed for anxiety. 60 tablet 5  . ferrous sulfate  325 (65 FE) MG EC tablet Take 325 mg by mouth 3 (three) times a week.     . Fluticasone-Salmeterol (ADVAIR DISKUS) 100-50 MCG/DOSE AEPB Inhale 1 puff into the lungs every 12 (twelve) hours. Discontinue all previous refills for this medication. Hold/Fill when appropriate according to pt rx fill schedule. 180 each 3  . hydrALAZINE (APRESOLINE) 25 MG tablet TAKE 1 TABLET (25 MG TOTAL) BY MOUTH 3 (THREE) TIMES DAILY. 270 tablet 3  . isosorbide mononitrate (IMDUR) 60 MG 24 hr tablet  TAKE 1 TABLET (60 MG TOTAL) BY MOUTH DAILY. 30 tablet 5  . isosorbide mononitrate (IMDUR) 60 MG 24 hr tablet TAKE 1 TABLET (60 MG TOTAL) BY MOUTH DAILY. 30 tablet 5  . levothyroxine (SYNTHROID, LEVOTHROID) 137 MCG tablet TAKE 1 TABLET BY MOUTH DAILY 30 tablet 5  . potassium chloride SA (K-DUR,KLOR-CON) 20 MEQ tablet Take 1 tablet (20 mEq total) by mouth daily. 90 tablet 3  . prednisoLONE acetate (PRED FORTE) 1 % ophthalmic suspension Place 1 drop into both eyes as needed. For eye pain    . predniSONE (DELTASONE) 5 MG tablet Take 1 tablet (5 mg total) by mouth daily with breakfast. Prednisone: take a total of 5 mg/d pc 90 tablet 3  . timolol (TIMOPTIC) 0.5 % ophthalmic solution Place 1 drop into both eyes 2 (two) times daily.     [name] torsemide (DEMADEX) 20 MG tablet Take 20 mg by mouth daily as needed (for edema).    . traMADol (ULTRAM) 50 MG tablet TAKE 1-2 TABLETS BY MOUTH 2 TIMES DAILY AS NEEDED FOR MODERATE OR SEVERE PAIN 100 tablet 2  . triamcinolone cream (KENALOG) 0.1 % Apply 1 application topically 2 (two) times daily. 90 g 3  . vitamin D, CHOLECALCIFEROL, 400 UNITS tablet Take 400 Units by mouth 3 (three) times a week.     . warfarin (COUMADIN) 3 MG tablet TAKE AS DIRECTED BY ANTICOAGULATION CLINIC 30 tablet 3   No current facility-administered medications for this visit.    Allergies  Amlodipine; Atorvastatin; Colesevelam; Coumadin; Lasix; Statins; and Tape  Electrocardiogram: 2013  SR possible old IMI rate60  01/2014  SB rate 56 LVH LAD  04/26/15 SR rate 66 LAD no change   Assessment and Plan CAD:  Stable with no angina and good activity level.  Continue medical Rx AS:  Moderate by echo 08/2014  Will repeat to check EF and gradient given dyspnea  Headache: ESR normal refer to Dr Veto Kemps  Carotids:  Duplex 05/2015  no restenosis post CEA bilateral ASA  Dyspnea:  Add zaroxyln continue demedex BMET f/u echo  CRF:  Baseline Cr around 2 does not help with volume overload  F/U with me   Next available   Jenkins Rouge

## 2015-06-27 ENCOUNTER — Encounter: Payer: Self-pay | Admitting: Cardiovascular Disease

## 2015-06-27 ENCOUNTER — Ambulatory Visit (INDEPENDENT_AMBULATORY_CARE_PROVIDER_SITE_OTHER): Payer: Medicare Other | Admitting: Cardiovascular Disease

## 2015-06-27 VITALS — BP 130/50 | HR 59 | Ht 66.0 in | Wt 219.8 lb

## 2015-06-27 DIAGNOSIS — R519 Headache, unspecified: Secondary | ICD-10-CM

## 2015-06-27 DIAGNOSIS — Z79899 Other long term (current) drug therapy: Secondary | ICD-10-CM | POA: Diagnosis not present

## 2015-06-27 DIAGNOSIS — R51 Headache: Secondary | ICD-10-CM

## 2015-06-27 DIAGNOSIS — I6523 Occlusion and stenosis of bilateral carotid arteries: Secondary | ICD-10-CM

## 2015-06-27 LAB — BASIC METABOLIC PANEL
BUN: 23 mg/dL (ref 7–25)
CALCIUM: 8.1 mg/dL — AB (ref 8.6–10.4)
CHLORIDE: 110 mmol/L (ref 98–110)
CO2: 16 mmol/L — AB (ref 20–31)
Creat: 1.82 mg/dL — ABNORMAL HIGH (ref 0.60–0.93)
Glucose, Bld: 137 mg/dL — ABNORMAL HIGH (ref 65–99)
POTASSIUM: 4.2 mmol/L (ref 3.5–5.3)
SODIUM: 139 mmol/L (ref 135–146)

## 2015-06-27 MED ORDER — METOLAZONE 2.5 MG PO TABS: 2.5000 mg | ORAL_TABLET | Freq: Every day | ORAL | Status: DC

## 2015-06-27 NOTE — Patient Instructions (Addendum)
Medication Instructions:  Your physician has recommended you make the following change in your medication:  1-START Zaroxolyn 2.5 mg by mouth daily   Labwork: Your physician recommends that you have lab work today BMET Your physician recommends that you return for lab work in: 3 weeks for BMET   Testing/Procedures: NONE  Follow-Up: Your physician wants you to follow-up next available with Dr. Johnsie Cancel.  If you need a refill on your cardiac medications before your next appointment, please call your pharmacy.  You have been referred to Dr. Tomi Likens for headaches.

## 2015-06-28 ENCOUNTER — Telehealth: Payer: Self-pay | Admitting: Cardiovascular Disease

## 2015-06-28 NOTE — Telephone Encounter (Signed)
New message    Please call pt husband and speak with him about the referral to [facility] office

## 2015-06-28 NOTE — Telephone Encounter (Signed)
Done

## 2015-06-28 NOTE — Telephone Encounter (Signed)
Left message to call back  

## 2015-07-05 ENCOUNTER — Ambulatory Visit (INDEPENDENT_AMBULATORY_CARE_PROVIDER_SITE_OTHER): Payer: Medicare Other | Admitting: Neurology

## 2015-07-05 ENCOUNTER — Encounter: Payer: Self-pay | Admitting: Neurology

## 2015-07-05 VITALS — BP 122/58 | HR 55 | Ht 66.0 in | Wt 200.0 lb

## 2015-07-05 DIAGNOSIS — I6523 Occlusion and stenosis of bilateral carotid arteries: Secondary | ICD-10-CM

## 2015-07-05 DIAGNOSIS — R51 Headache: Secondary | ICD-10-CM

## 2015-07-05 DIAGNOSIS — Z8489 Family history of other specified conditions: Secondary | ICD-10-CM

## 2015-07-05 DIAGNOSIS — R519 Headache, unspecified: Secondary | ICD-10-CM

## 2015-07-05 DIAGNOSIS — Z8249 Family history of ischemic heart disease and other diseases of the circulatory system: Secondary | ICD-10-CM

## 2015-07-05 MED ORDER — GABAPENTIN 100 MG PO CAPS: ORAL_CAPSULE | ORAL | Status: DC

## 2015-07-05 NOTE — Patient Instructions (Signed)
1.  Start gabapentin 100mg  capsules:  Take 1 capsule at bedtime for 7 days,   Then 1 capsule in AM and 1 capsule at bedtime for 7 days  Then 2 capsules in AM and 2 capsules at bedtime 2.  Take tramadol with Tylenol for severe headache but LIMIT TO NO MORE THAN 2 DAYS OUT OF THE WEEK 3.  Check MRA of head

## 2015-07-05 NOTE — Progress Notes (Signed)
NEUROLOGY CONSULTATION NOTE  Wendy Cole MRN: [id number] DOB: [birth year]  Referring provider: Dr. Johnsie Cancel Primary care provider: Dr. Alain [facility]  Reason for consult:  Headache  HISTORY OF PRESENT ILLNESS: Wendy Cole is a 80 year old right-handed female with heart failure, paroxysmal atrial fibrillation, non-ischemic cardiomyopathy, OSA, hypertension, renal insufficiency and prior DVT who presents for headache.  History obtained by patient and husband.  Labs and MRA report reviewed.  Onset:  7 years Location:  Right sided Quality:  Pressure.  She has right sided suboccipital and neck pain as well. Intensity:  8/10 Aura:  no Prodrome:  no Associated symptoms:  Sometimes nausea and vomiting, but may be separate from headache.  No vision loss. Duration:  Anywhere from 1 hour to all day Frequency:  daily  Current Antihypertensive medications:  Coreg, hydralazine, Imdur, Zaroxolyn, Demadex Current Antidepressant medications:  Bupropion  Sed Rates 9 (04/26/15), 27 (04/12/15), 10 (11/07/14), 10 (08/31/14) She had two sisters with cerebral aneurysm and her father had AAA. MRA of head from 07/20/02 revealed no aneurysm She has history of migraines with visual aura.  PAST MEDICAL HISTORY: Past Medical History  Diagnosis Date  . Anemia   . Anxiety states   . Depressive disorder, not elsewhere classified   . Type II or unspecified type diabetes mellitus without mention of complication, not stated as uncontrolled   . Hyperlipidemia   . Unspecified essential hypertension   . Renal insufficiency   . Osteoarthritis   . Gout   . Iritis   . Nonischemic cardiomyopathy (HCC)     EF 20 to 25% per echo 06/2011  . Chronic anticoagulation   . NSTEMI (non-ST elevated myocardial infarction) [facility]) Feb 2013    06/2011 cath  Mild nonobstructive disease. No LV gram  . PAF (paroxysmal atrial fibrillation) ([place])   . Obesity   . Cancer [facility]) 2010    Right breast  . DVT (deep venous  thrombosis) ([place]) 2010  . Stroke [facility]) 2004    Left upper lobe  . Thyroid disease 1969    Three fourth of Thyroid removed  . Complication of anesthesia     "fights it", for colonoscopy  . Flash pulmonary edema ([place])     06/2011  . HOH (hard of hearing)     deaf in L completely  . OSA on CPAP     CPAP at night  . GERD (gastroesophageal reflux disease)   . Hypothyroidism   . Chronic kidney disease (CKD), stage III (moderate)   . Peripheral vascular disease ([place]) Jan. 2015    Left leg pain and swelling    PAST SURGICAL HISTORY: Past Surgical History  Procedure Laterality Date  . Breast lumpectomy    . Cataract extraction    . Foot surgery    . Tonsillectomy  1949  . Abdominal hysterectomy  1974  . Eye surgery  2008    Glaucoma shunt Right eye  . Parotid gland tumor excision  2000  . Cystectomy  1974    Left Breast, chin, left of groin area  . Thyroidectomy, partial  1969  . Endarterectomy  11/18/2011    Procedure: ENDARTERECTOMY CAROTID;  Surgeon: Conrad [place]es [place], MD;  Location: [facility];  Service: Vascular;  Laterality: Left;  [name] Mastectomy  right  . Mastectomy    . Cardiac catheterization      no stent  . Colonoscopy w/ polypectomy    . Endarterectomy  01/12/2012    Procedure: ENDARTERECTOMY CAROTID;  Surgeon: Jannette Fogo  Bridgett Larsson, MD;  Location[facility];  Service: Vascular;  Laterality: Right;  Right carotid endarterectomy with 1cm x 6cm vascuguard patch angioplasty.  . Carotid endarterectomy Left 11-18-11    cea  . Carotid endarterectomy Right 01-12-12    cea  . Left and right heart catheterization with coronary/graft angiogram Left 07/20/2011    Procedure: LEFT AND RIGHT HEART CATHETERIZATION WITH Beatrix Fetters;  Surgeon: Peter M Martinique, MD;  Location: [facility] CATH LAB;  Service: Cardiovascular;  Laterality: Left;    MEDICATIONS: Current Outpatient Prescriptions on File Prior to Visit  Medication Sig Dispense Refill  . acetaminophen (TYLENOL) 500 MG tablet Take 500 mg by mouth  daily as needed for mild pain, moderate pain or headache.     . albuterol (PROVENTIL HFA;VENTOLIN HFA) 108 (90 BASE) MCG/ACT inhaler Inhale 2 puffs into the lungs every 6 (six) hours as needed for wheezing or shortness of breath. 1 Inhaler 6  . allopurinol (ZYLOPRIM) 100 MG tablet TAKE 1 TABLET by mouth  three times a week    . brimonidine (ALPHAGAN) 0.15 % ophthalmic solution Place 1 drop into both eyes daily.     [name] buPROPion (WELLBUTRIN SR) 150 MG 12 hr tablet Take 150 mg by mouth daily.   3  . calcium carbonate (CALCIUM 500) 1250 MG tablet Take 1 tablet by mouth 3 (three) times a week.     . carvedilol (COREG) 12.5 MG tablet Take 2 tabs by mouth in the am & 1 tab by mouth in the pm    . clorazepate (TRANXENE) 7.5 MG tablet Take 1 tablet (7.5 mg total) by mouth 2 (two) times daily as needed for anxiety. 60 tablet 5  . ferrous sulfate 325 (65 FE) MG EC tablet Take 325 mg by mouth 3 (three) times a week.     . Fluticasone-Salmeterol (ADVAIR DISKUS) 100-50 MCG/DOSE AEPB Inhale 1 puff into the lungs every 12 (twelve) hours. Discontinue all previous refills for this medication. Hold/Fill when appropriate according to pt rx fill schedule. 180 each 3  . hydrALAZINE (APRESOLINE) 25 MG tablet TAKE 1 TABLET (25 MG TOTAL) BY MOUTH 3 (THREE) TIMES DAILY. 270 tablet 3  . isosorbide mononitrate (IMDUR) 60 MG 24 hr tablet TAKE 1 TABLET (60 MG TOTAL) BY MOUTH DAILY. 30 tablet 5  . isosorbide mononitrate (IMDUR) 60 MG 24 hr tablet TAKE 1 TABLET (60 MG TOTAL) BY MOUTH DAILY. 30 tablet 5  . levothyroxine (SYNTHROID, LEVOTHROID) 137 MCG tablet TAKE 1 TABLET BY MOUTH DAILY 30 tablet 5  . metolazone (ZAROXOLYN) 2.5 MG tablet Take 1 tablet (2.5 mg total) by mouth daily. 30 tablet 11  . prednisoLONE acetate (PRED FORTE) 1 % ophthalmic suspension Place 1 drop into both eyes as needed. For eye pain    . predniSONE (DELTASONE) 5 MG tablet Take 1 tablet (5 mg total) by mouth daily with breakfast. Prednisone: take a total of 5  mg/d pc 90 tablet 3  . timolol (TIMOPTIC) 0.5 % ophthalmic solution Place 1 drop into both eyes 2 (two) times daily.     [name] torsemide (DEMADEX) 20 MG tablet Take 20 mg by mouth daily as needed (for edema).    . traMADol (ULTRAM) 50 MG tablet TAKE 1-2 TABLETS BY MOUTH 2 TIMES DAILY AS NEEDED FOR MODERATE OR SEVERE PAIN 100 tablet 3  . triamcinolone cream (KENALOG) 0.1 % Apply 1 application topically 2 (two) times daily. 90 g 3  . vitamin D, CHOLECALCIFEROL, 400 UNITS tablet Take 400 Units by mouth 3 (  three) times a week.     . warfarin (COUMADIN) 3 MG tablet TAKE AS DIRECTED BY ANTICOAGULATION CLINIC 30 tablet 3   No current facility-administered medications on file prior to visit.    ALLERGIES: Allergies  Allergen Reactions  . Amlodipine     edema  . Atorvastatin Other (See Comments)    Muscle aches  . Colesevelam Other (See Comments)    unknown  . Coumadin [Warfarin Sodium]     "causes cramps" brand name only per pt  . Lasix [Furosemide] Other (See Comments)    Muscle cramps  . Statins Other (See Comments)    Muscle aches  . Tape Rash    FAMILY HISTORY: Family History  Problem Relation Age of Onset  . Hypertension    . Congestive Heart Failure Mother 91  . Breast cancer Mother   . Heart attack Mother   . Heart disease Father 29  . Heart disease Brother   . Coronary artery disease Son 40    2 Stents  . Heart attack Father   . AAA (abdominal aortic aneurysm) Brother     SOCIAL HISTORY: Social History   Social History  . Marital Status: Married    Spouse Name: N/A  . Number of Children: 3  . Years of Education: N/A   Occupational History  . Retired    Social History Main Topics  . Smoking status: Former Smoker -- 2.00 packs/day for 40 years    Types: Cigarettes    Quit date: 05/25/2002  . Smokeless tobacco: Never Used  . Alcohol Use: No  . Drug Use: No  . Sexual Activity: Not Currently   Other Topics Concern  . Not on file   Social History Narrative    Lives with husband and youngest son.      REVIEW OF SYSTEMS: Constitutional: No fevers, chills, or sweats, no generalized fatigue, change in appetite Eyes: No visual changes, double vision, eye pain Ear, nose and throat: No hearing loss, ear pain, nasal congestion, sore throat Cardiovascular: No chest pain, palpitations Respiratory:  No shortness of breath at rest or with exertion, wheezes GastrointestinaI: No nausea, vomiting, diarrhea, abdominal pain, fecal incontinence Genitourinary:  No dysuria, urinary retention or frequency Musculoskeletal:  No neck pain, back pain Integumentary: No rash, pruritus, skin lesions Neurological: as above Psychiatric: No depression, insomnia, anxiety Endocrine: No palpitations, fatigue, diaphoresis, mood swings, change in appetite, change in weight, increased thirst Hematologic/Lymphatic:  No anemia, purpura, petechiae. Allergic/Immunologic: no itchy/runny eyes, nasal congestion, recent allergic reactions, rashes  PHYSICAL EXAM: Filed Vitals:   07/05/15 1244  BP: 122/58  Pulse: 55   General: No acute distress.  Patient appears well-groomed.  Head:  Normocephalic/atraumatic Eyes:  fundi unremarkable, without vessel changes, exudates, hemorrhages or papilledema. Neck: supple, no paraspinal tenderness, full range of motion Back: No paraspinal tenderness Heart: regular rate and rhythm Lungs: Clear to auscultation bilaterally. Vascular: No carotid bruits. Neurological Exam: Mental status: alert and oriented to person, place, and time, recent and remote memory intact, fund of knowledge intact, attention and concentration intact, speech fluent and not dysarthric, language intact. Cranial nerves: CN I: not tested CN II: pupils equal, round and reactive to light, visual fields intact, fundi unremarkable, without vessel changes, exudates, hemorrhages or papilledema. CN III, IV, VI:  full range of motion, no nystagmus, no ptosis CN V: facial sensation  intact CN VII: upper and lower face symmetric CN VIII: hearing intact CN IX, X: gag intact, uvula midline CN XI: sternocleidomastoid and  trapezius muscles intact CN XII: tongue midline Bulk & Tone: normal, no fasciculations. Motor:  5/5 throughout  Sensation:  Temperature intact.  Decreased vibration sensation in toes. Deep Tendon Reflexes:  trace throughout, toes downgoing.  Finger to nose testing:  Without dysmetria.  Heel to shin:  Without dysmetria.  Gait:  Wide based.  Able to turn, unable to tandem walk. Romberg with sway.  IMPRESSION: Right sided headache. Possibly cervicogenic.  Headache is chronic and sed rate not elevated enough to suggest temporal arteritis. Family history of cerebral aneurysm (2 first degree relatives with cerebral aneurysm)  PLAN: 1.  Start gabapentin, titrating to 200mg  twice daily 2.  Limit Tramadol with Tylenol to no more than 2 days out of the week 3.  Will get MRA of head to monitor for aneurysm 4.  Follow up in 3-4 months.  She is to call in 4 weeks with update.  Thank you for allowing me to take part in the care of this patient.  Metta Clines, DO  CC:  Jenkins Rouge, MD  Lew Dawes, MD

## 2015-07-12 ENCOUNTER — Ambulatory Visit: Payer: Medicare Other

## 2015-07-15 ENCOUNTER — Other Ambulatory Visit: Payer: Medicare Other

## 2015-07-16 ENCOUNTER — Encounter: Payer: Self-pay | Admitting: Internal Medicine

## 2015-07-16 ENCOUNTER — Ambulatory Visit (INDEPENDENT_AMBULATORY_CARE_PROVIDER_SITE_OTHER): Payer: Medicare Other | Admitting: Internal Medicine

## 2015-07-16 ENCOUNTER — Ambulatory Visit (INDEPENDENT_AMBULATORY_CARE_PROVIDER_SITE_OTHER): Payer: Medicare Other | Admitting: General Practice

## 2015-07-16 VITALS — BP 124/56 | HR 53 | Wt 222.0 lb

## 2015-07-16 DIAGNOSIS — I82402 Acute embolism and thrombosis of unspecified deep veins of left lower extremity: Secondary | ICD-10-CM | POA: Diagnosis not present

## 2015-07-16 DIAGNOSIS — E1159 Type 2 diabetes mellitus with other circulatory complications: Secondary | ICD-10-CM

## 2015-07-16 DIAGNOSIS — Z5181 Encounter for therapeutic drug level monitoring: Secondary | ICD-10-CM

## 2015-07-16 DIAGNOSIS — Z7901 Long term (current) use of anticoagulants: Secondary | ICD-10-CM | POA: Diagnosis not present

## 2015-07-16 DIAGNOSIS — I6523 Occlusion and stenosis of bilateral carotid arteries: Secondary | ICD-10-CM

## 2015-07-16 DIAGNOSIS — Z86718 Personal history of other venous thrombosis and embolism: Secondary | ICD-10-CM

## 2015-07-16 DIAGNOSIS — I480 Paroxysmal atrial fibrillation: Secondary | ICD-10-CM

## 2015-07-16 DIAGNOSIS — I428 Other cardiomyopathies: Secondary | ICD-10-CM

## 2015-07-16 DIAGNOSIS — I82509 Chronic embolism and thrombosis of unspecified deep veins of unspecified lower extremity: Secondary | ICD-10-CM

## 2015-07-16 DIAGNOSIS — I10 Essential (primary) hypertension: Secondary | ICD-10-CM

## 2015-07-16 DIAGNOSIS — N183 Chronic kidney disease, stage 3 unspecified: Secondary | ICD-10-CM

## 2015-07-16 DIAGNOSIS — M5440 Lumbago with sciatica, unspecified side: Secondary | ICD-10-CM

## 2015-07-16 DIAGNOSIS — I63419 Cerebral infarction due to embolism of unspecified middle cerebral artery: Secondary | ICD-10-CM

## 2015-07-16 DIAGNOSIS — I152 Hypertension secondary to endocrine disorders: Secondary | ICD-10-CM

## 2015-07-16 DIAGNOSIS — I429 Cardiomyopathy, unspecified: Secondary | ICD-10-CM

## 2015-07-16 LAB — POCT INR: INR: 2.4

## 2015-07-16 MED ORDER — GABAPENTIN 100 MG PO CAPS: ORAL_CAPSULE | ORAL | Status: DC

## 2015-07-16 MED ORDER — TORSEMIDE 20 MG PO TABS: 40.0000 mg | ORAL_TABLET | Freq: Every day | ORAL | Status: DC | PRN

## 2015-07-16 NOTE — Telephone Encounter (Signed)
Patient had appointment with Dr. Tomi Likens on 07/05/15.

## 2015-07-16 NOTE — Progress Notes (Signed)
Subjective:  [name], female    DOB: 07/08/1935  Age: 80 y.o. MRN: [id number]  CC: No chief complaint on file.   HPI Wendy Cole presents for CHF, HTN, depression/anxiety f/u  Outpatient Prescriptions Prior to Visit  Medication Sig Dispense Refill  . acetaminophen (TYLENOL) 500 MG tablet Take 500 mg by mouth daily as needed for mild pain, moderate pain or headache.     . albuterol (PROVENTIL HFA;VENTOLIN HFA) 108 (90 BASE) MCG/ACT inhaler Inhale 2 puffs into the lungs every 6 (six) hours as needed for wheezing or shortness of breath. 1 Inhaler 6  . allopurinol (ZYLOPRIM) 100 MG tablet TAKE 1 TABLET by mouth  three times a week    . brimonidine (ALPHAGAN) 0.15 % ophthalmic solution Place 1 drop into both eyes daily.     [name] buPROPion (WELLBUTRIN SR) 150 MG 12 hr tablet Take 150 mg by mouth daily.   3  . calcium carbonate (CALCIUM 500) 1250 MG tablet Take 1 tablet by mouth 3 (three) times a week.     . carvedilol (COREG) 12.5 MG tablet Take 2 tabs by mouth in the am & 1 tab by mouth in the pm    . clorazepate (TRANXENE) 7.5 MG tablet Take 1 tablet (7.5 mg total) by mouth 2 (two) times daily as needed for anxiety. 60 tablet 5  . ferrous sulfate 325 (65 FE) MG EC tablet Take 325 mg by mouth 3 (three) times a week.     . Fluticasone-Salmeterol (ADVAIR DISKUS) 100-50 MCG/DOSE AEPB Inhale 1 puff into the lungs every 12 (twelve) hours. Discontinue all previous refills for this medication. Hold/Fill when appropriate according to pt rx fill schedule. 180 each 3  . hydrALAZINE (APRESOLINE) 25 MG tablet TAKE 1 TABLET (25 MG TOTAL) BY MOUTH 3 (THREE) TIMES DAILY. 270 tablet 3  . isosorbide mononitrate (IMDUR) 60 MG 24 hr tablet TAKE 1 TABLET (60 MG TOTAL) BY MOUTH DAILY. 30 tablet 5  . levothyroxine (SYNTHROID, LEVOTHROID) 137 MCG tablet TAKE 1 TABLET BY MOUTH DAILY 30 tablet 5  . metolazone (ZAROXOLYN) 2.5 MG tablet Take 1 tablet (2.5 mg total) by mouth daily. 30 tablet 11   . prednisoLONE acetate (PRED FORTE) 1 % ophthalmic suspension Place 1 drop into both eyes as needed. For eye pain    . predniSONE (DELTASONE) 5 MG tablet Take 1 tablet (5 mg total) by mouth daily with breakfast. Prednisone: take a total of 5 mg/d pc 90 tablet 3  . timolol (TIMOPTIC) 0.5 % ophthalmic solution Place 1 drop into both eyes 2 (two) times daily.     . traMADol (ULTRAM) 50 MG tablet TAKE 1-2 TABLETS BY MOUTH 2 TIMES DAILY AS NEEDED FOR MODERATE OR SEVERE PAIN 100 tablet 3  . triamcinolone cream (KENALOG) 0.1 % Apply 1 application topically 2 (two) times daily. 90 g 3  . vitamin D, CHOLECALCIFEROL, 400 UNITS tablet Take 400 Units by mouth 3 (three) times a week.     . warfarin (COUMADIN) 3 MG tablet TAKE AS DIRECTED BY ANTICOAGULATION CLINIC 30 tablet 3  . gabapentin (NEURONTIN) 100 MG capsule Take 1 cap at bedtime for 7 days, then 1 cap twice daily for 7 days, then 2 caps twice daily 120 capsule 0  . isosorbide mononitrate (IMDUR) 60 MG 24 hr tablet TAKE 1 TABLET (60 MG TOTAL) BY MOUTH DAILY. 30 tablet 5  . torsemide (DEMADEX) 20 MG tablet Take 20 mg by mouth daily as needed (for edema).  No facility-administered medications prior to visit.    ROS Review of Systems  Constitutional: Negative for chills, activity change, appetite change, fatigue and unexpected weight change.  HENT: Negative for congestion, mouth sores and sinus pressure.   Eyes: Negative for visual disturbance.  Respiratory: Negative for cough and chest tightness.   Cardiovascular: Positive for leg swelling.  Gastrointestinal: Negative for nausea and abdominal pain.  Genitourinary: Negative for frequency, difficulty urinating and vaginal pain.  Musculoskeletal: Positive for back pain, arthralgias, gait problem and neck pain.  Skin: Negative for pallor and rash.  Neurological: Negative for dizziness, tremors, weakness, numbness and headaches.  Psychiatric/Behavioral: Negative for suicidal ideas, confusion, sleep  disturbance and decreased concentration. The patient is nervous/anxious.     Objective:  BP 124/56 mmHg  Pulse 53  Wt 222 lb (100.699 kg)  SpO2 92%  BP Readings from Last 3 Encounters:  07/16/15 124/56  07/05/15 122/58  06/27/15 130/50    Wt Readings from Last 3 Encounters:  07/16/15 222 lb (100.699 kg)  07/05/15 200 lb (90.719 kg)  06/27/15 219 lb 12.8 oz (99.701 kg)    Physical Exam  Constitutional: She appears well-developed. No distress.  HENT:  Head: Normocephalic.  Right Ear: External ear normal.  Left Ear: External ear normal.  Nose: Nose normal.  Mouth/Throat: Oropharynx is clear and moist.  Eyes: Conjunctivae are normal. Pupils are equal, round, and reactive to light. Right eye exhibits no discharge. Left eye exhibits no discharge.  Neck: Normal range of motion. Neck supple. No JVD present. No tracheal deviation present. No thyromegaly present.  Cardiovascular: Normal rate, regular rhythm and normal heart sounds.   Pulmonary/Chest: No stridor. No respiratory distress. She has no wheezes.  Abdominal: Soft. Bowel sounds are normal. She exhibits no distension and no mass. There is no tenderness. There is no rebound and no guarding.  Musculoskeletal: She exhibits edema and tenderness.  Lymphadenopathy:    She has no cervical adenopathy.  Neurological: She displays normal reflexes. No cranial nerve deficit. She exhibits normal muscle tone. Coordination abnormal.  Skin: No rash noted. No erythema.  Psychiatric: She has a normal mood and affect. Her behavior is normal. Judgment and thought content normal.  ataxic  Obese,  Cane Edema 2+ B LEs; face is puffy  Lab Results  Component Value Date   WBC 7.0 05/02/2015   HGB 9.9* 05/02/2015   HCT 32.1* 05/02/2015   PLT 294 05/02/2015   GLUCOSE 137* 06/27/2015   CHOL 240* 11/17/2013   TRIG 269.0* 11/17/2013   HDL 32.30* 11/17/2013   LDLDIRECT 147.8 05/08/2013   LDLCALC 154* 11/17/2013   ALT 12 03/20/2014   AST 23  03/20/2014   NA 139 06/27/2015   K 4.2 06/27/2015   CL 110 06/27/2015   CREATININE 1.82* 06/27/2015   BUN 23 06/27/2015   CO2 16* 06/27/2015   TSH 3.34 11/07/2014   INR 2.4 07/16/2015   HGBA1C 6.4 04/12/2015   MICROALBUR 3.1* 09/27/2006    No results found.  Assessment & Plan:   There are no diagnoses linked to this encounter. I have changed Wendy Cole's torsemide and gabapentin. I am also having her maintain her prednisoLONE acetate, timolol, vitamin D (CHOLECALCIFEROL), brimonidine, calcium carbonate, albuterol, allopurinol, ferrous sulfate, triamcinolone cream, buPROPion, acetaminophen, clorazepate, Fluticasone-Salmeterol, levothyroxine, predniSONE, carvedilol, hydrALAZINE, isosorbide mononitrate, warfarin, traMADol, and metolazone.  Meds ordered this encounter  Medications  . torsemide (DEMADEX) 20 MG tablet    Sig: Take 2 tablets (40 mg total) by mouth daily as needed (  for edema).    Dispense:  60 tablet    Refill:  5  . gabapentin (NEURONTIN) 100 MG capsule    Sig: 2 caps twice daily    Dispense:  120 capsule    Refill:  5     Follow-up: Return in about 3 months (around 10/13/2015) for a follow-up visit.  Walker Kehr, MD

## 2015-07-16 NOTE — Progress Notes (Signed)
Pre visit review using our clinic review tool, if applicable. No additional management support is needed unless otherwise documented below in the visit note. 

## 2015-07-16 NOTE — Patient Instructions (Signed)
PLAN: 1. Start gabapentin, titrating to 200mg  twice daily 2. Limit Tramadol with Tylenol to no more than 2 days out of the week 3. Will get MRA of head to monitor for aneurysm 4. Follow up in 3-4 months. She is to call in 4 weeks with update.  Thank you for allowing me to take part in the care of this patient.  Metta Clines, DO

## 2015-07-16 NOTE — Progress Notes (Signed)
I have reviewed and agree with the plan. 

## 2015-07-17 IMAGING — DX DG ORBITS FOR FOREIGN BODY
2 series · 2 of 2 positions shown · non-contrast
Comparison: None.

CLINICAL DATA: Glaucoma shunt.  Evaluate for MRI

EXAM:
ORBITS FOR FOREIGN BODY - 2 VIEW

[orbital waters (1 of 2)]
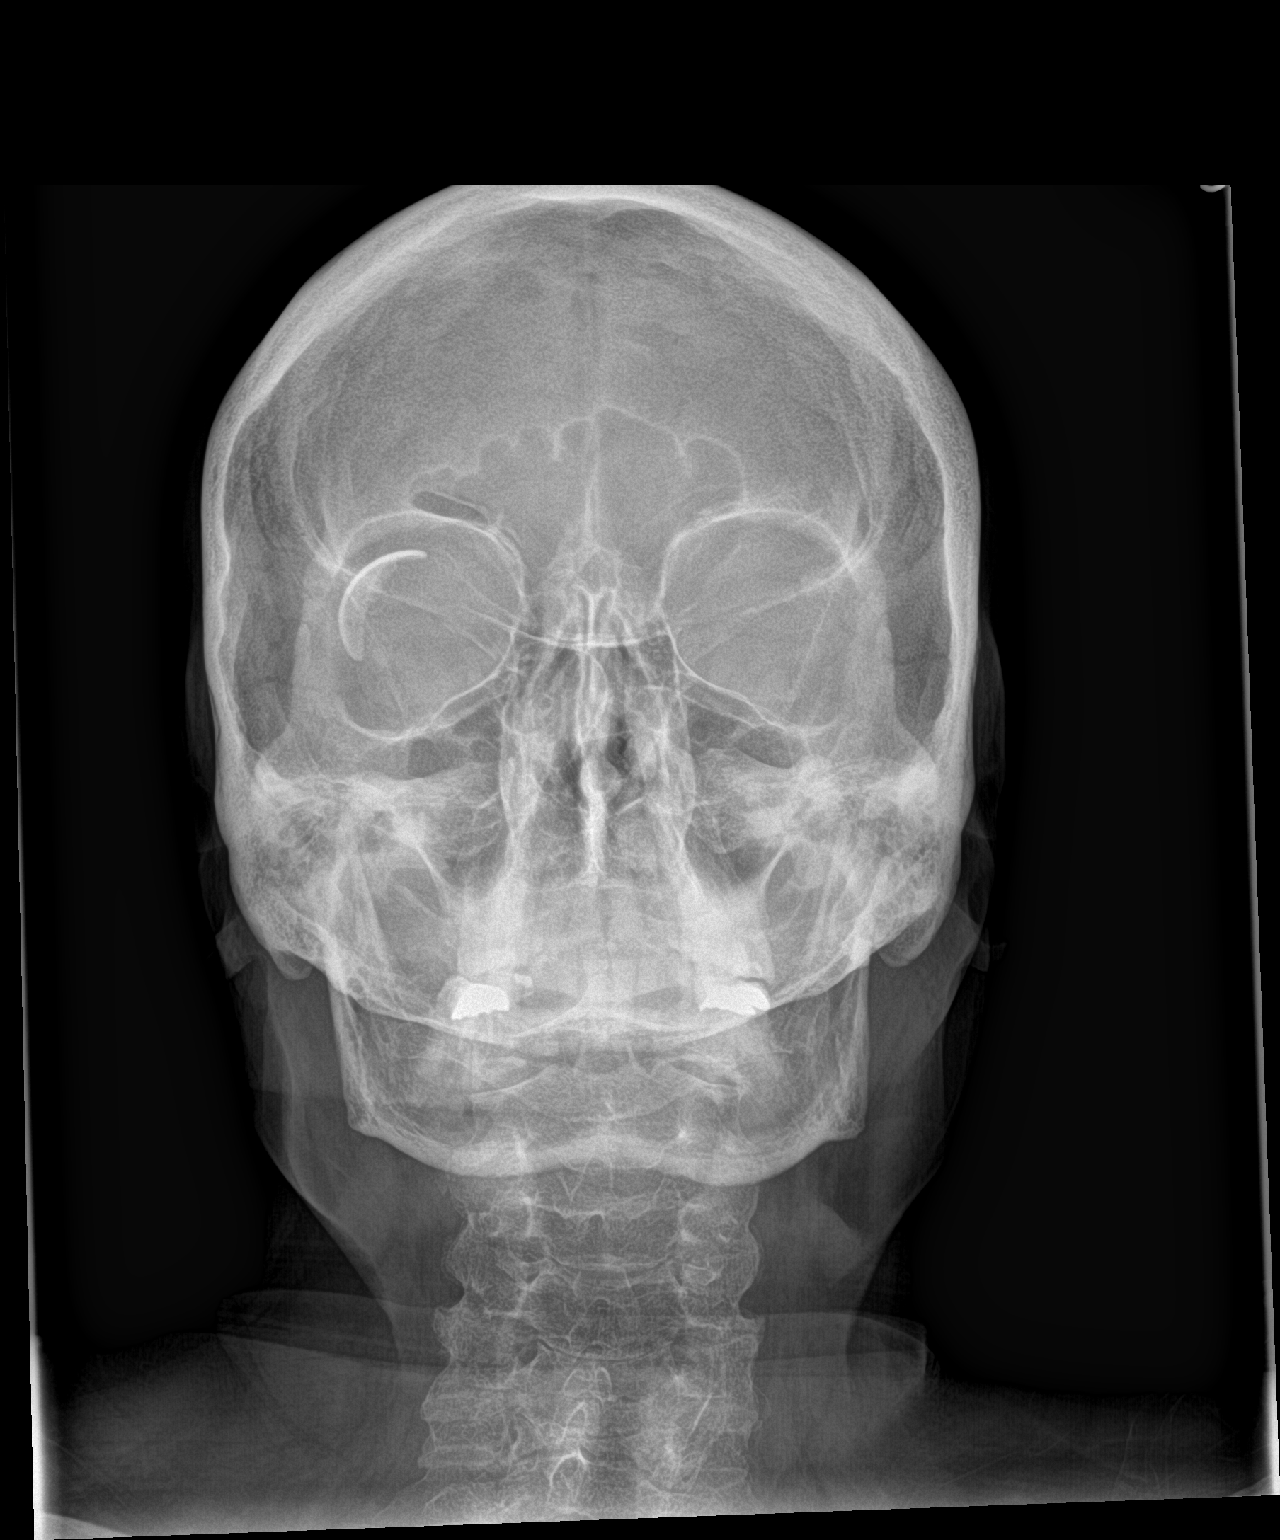

[orbital waters (2 of 2)]
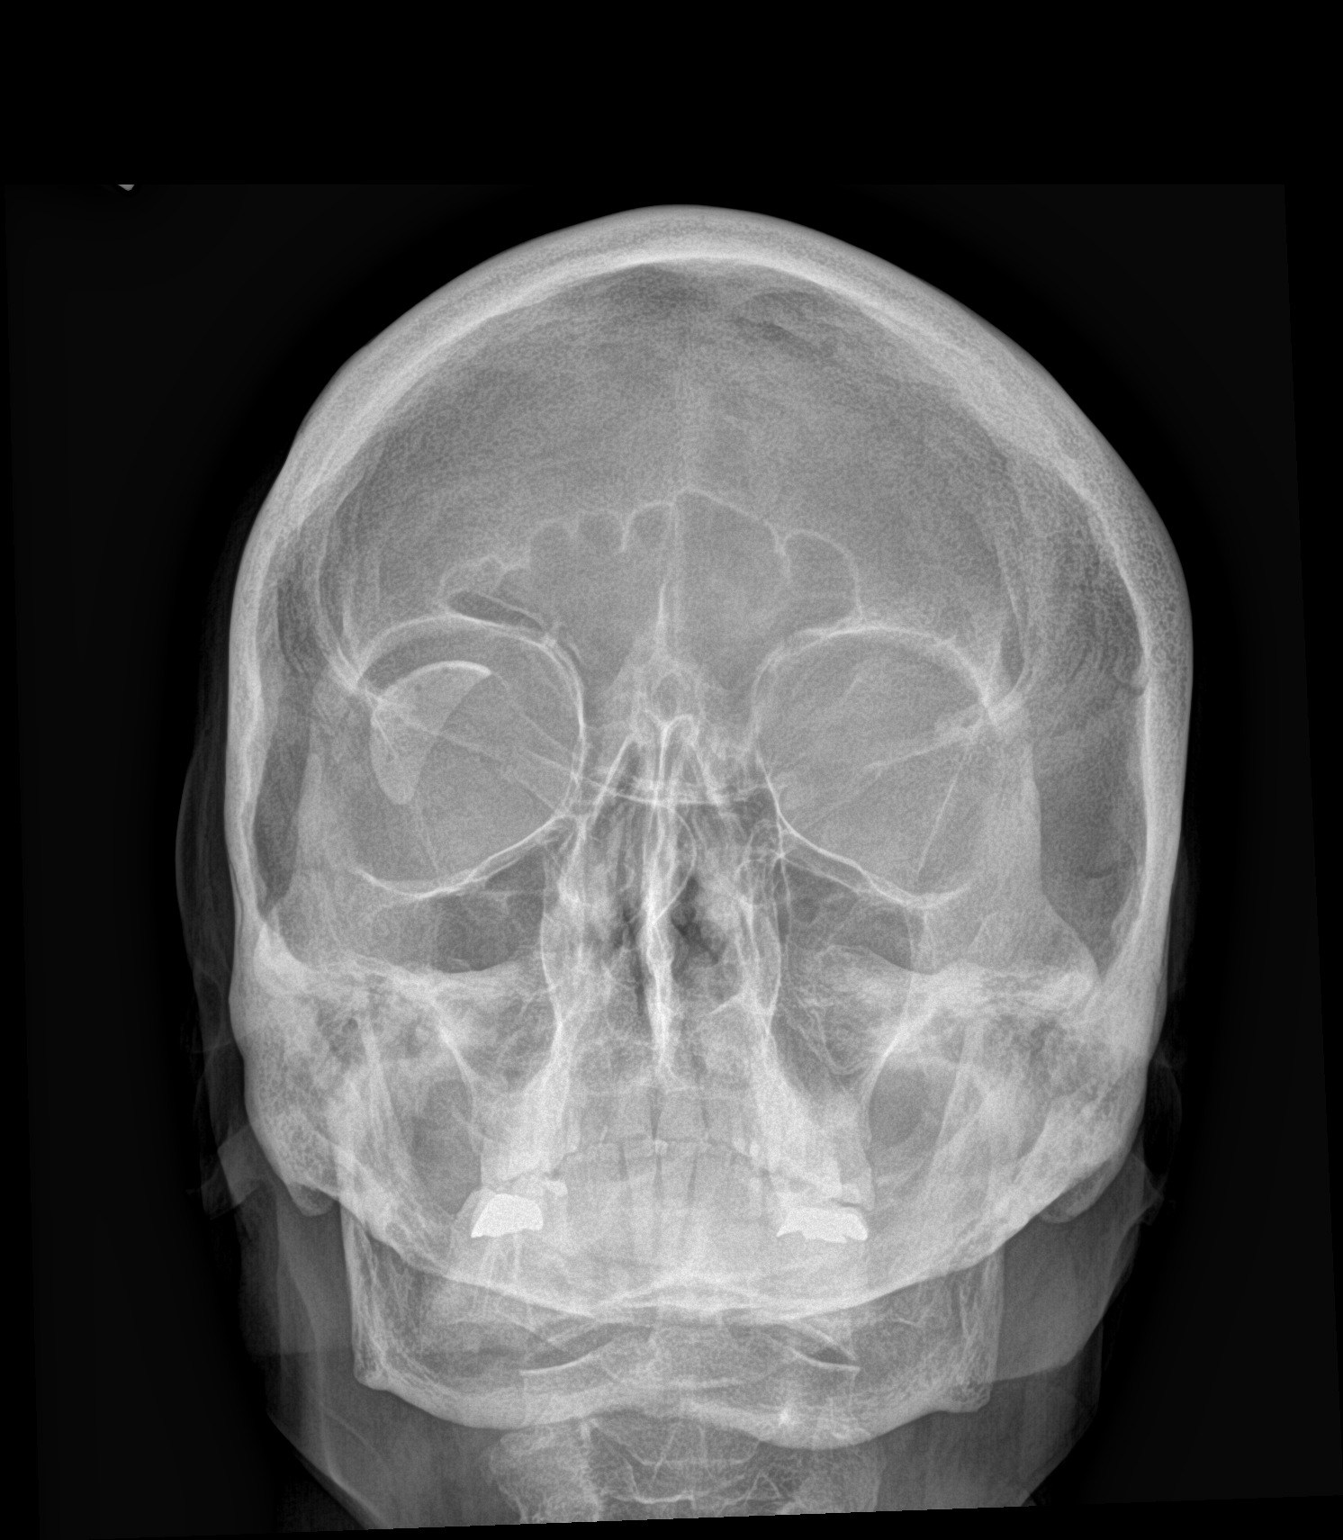

[2 of 2 positions shown; findings below may reference images not displayed]

FINDINGS: Radiopaque implanted device in the right orbit. No other foreign
body in the orbit.
IMPRESSION: Radiopaque implanted device right lateral orbit. This is likely safe
for MRI however further evaluation of the actual device is
recommended.

## 2015-07-17 NOTE — Assessment & Plan Note (Signed)
Hydralazine, Demadex

## 2015-07-17 NOTE — Assessment & Plan Note (Signed)
On Coumadin 

## 2015-07-17 NOTE — Assessment & Plan Note (Signed)
Monitor labs 

## 2015-07-17 NOTE — Assessment & Plan Note (Signed)
On Coumadin. No relapse °

## 2015-07-17 NOTE — Assessment & Plan Note (Signed)
Tramadol prn ° Potential benefits of a long term opioids use as well as potential risks (i.e. addiction risk, apnea etc) and complications (i.e. Somnolence, constipation and others) were explained to the patient and were aknowledged. ° ° °

## 2015-07-17 NOTE — Assessment & Plan Note (Signed)
Worse - wt gain/edema Re-start Demadex NAS diet

## 2015-07-18 ENCOUNTER — Other Ambulatory Visit (INDEPENDENT_AMBULATORY_CARE_PROVIDER_SITE_OTHER): Payer: Medicare Other | Admitting: *Deleted

## 2015-07-18 DIAGNOSIS — Z79899 Other long term (current) drug therapy: Secondary | ICD-10-CM | POA: Diagnosis not present

## 2015-07-18 LAB — BASIC METABOLIC PANEL
BUN: 32 mg/dL — ABNORMAL HIGH (ref 7–25)
CALCIUM: 8.5 mg/dL — AB (ref 8.6–10.4)
CHLORIDE: 106 mmol/L (ref 98–110)
CO2: 19 mmol/L — AB (ref 20–31)
Creat: 2.12 mg/dL — ABNORMAL HIGH (ref 0.60–0.93)
GLUCOSE: 97 mg/dL (ref 65–99)
Potassium: 4.4 mmol/L (ref 3.5–5.3)
SODIUM: 137 mmol/L (ref 135–146)

## 2015-07-18 NOTE — Addendum Note (Signed)
Addended by: Eulis Foster on: 07/18/2015 03:03 PM   Modules accepted: Orders

## 2015-07-19 ENCOUNTER — Ambulatory Visit
Admission: RE | Admit: 2015-07-19 | Discharge: 2015-07-19 | Disposition: A | Discharge status: Home / Self Care | Payer: Medicare Other | Source: Ambulatory Visit | Attending: Neurology | Admitting: Neurology

## 2015-07-19 DIAGNOSIS — Z8249 Family history of ischemic heart disease and other diseases of the circulatory system: Secondary | ICD-10-CM

## 2015-07-22 ENCOUNTER — Other Ambulatory Visit (INDEPENDENT_AMBULATORY_CARE_PROVIDER_SITE_OTHER): Payer: Medicare Other

## 2015-07-22 ENCOUNTER — Telehealth: Payer: Self-pay

## 2015-07-22 ENCOUNTER — Encounter: Payer: Self-pay | Admitting: Internal Medicine

## 2015-07-22 ENCOUNTER — Ambulatory Visit (INDEPENDENT_AMBULATORY_CARE_PROVIDER_SITE_OTHER): Payer: Medicare Other | Admitting: Internal Medicine

## 2015-07-22 ENCOUNTER — Ambulatory Visit (INDEPENDENT_AMBULATORY_CARE_PROVIDER_SITE_OTHER)
Admission: RE | Admit: 2015-07-22 | Discharge: 2015-07-22 | Disposition: A | Discharge status: Home / Self Care | Payer: Medicare Other | Source: Ambulatory Visit | Attending: Internal Medicine | Admitting: Internal Medicine

## 2015-07-22 VITALS — BP 122/78 | HR 52 | Ht 64.0 in | Wt 223.0 lb

## 2015-07-22 DIAGNOSIS — G4733 Obstructive sleep apnea (adult) (pediatric): Secondary | ICD-10-CM

## 2015-07-22 DIAGNOSIS — I5022 Chronic systolic (congestive) heart failure: Secondary | ICD-10-CM

## 2015-07-22 DIAGNOSIS — R0609 Other forms of dyspnea: Secondary | ICD-10-CM

## 2015-07-22 DIAGNOSIS — I82509 Chronic embolism and thrombosis of unspecified deep veins of unspecified lower extremity: Secondary | ICD-10-CM

## 2015-07-22 DIAGNOSIS — Z9989 Dependence on other enabling machines and devices: Secondary | ICD-10-CM

## 2015-07-22 DIAGNOSIS — I6523 Occlusion and stenosis of bilateral carotid arteries: Secondary | ICD-10-CM | POA: Diagnosis not present

## 2015-07-22 DIAGNOSIS — R0602 Shortness of breath: Secondary | ICD-10-CM

## 2015-07-22 LAB — BRAIN NATRIURETIC PEPTIDE: Pro B Natriuretic peptide (BNP): >5000 pg/mL — ABNORMAL HIGH (ref 0.0–100.0)

## 2015-07-22 NOTE — Patient Instructions (Addendum)
Order- CXR   Dx dyspnea with exertion  Order- walk test on room air  Order- office spirometry  Order lab- BNP, d-dimer  Order- ONOX on room air, On CPAP

## 2015-07-22 NOTE — Progress Notes (Signed)
Subjective:    [name], female    DOB: Apr 22, 1936, 80 y.o.   MRN: [id number]  HPI 10/17/10- 70 yoF former smoker followed for OSA and COPD, complicated by DM, hx breast cancer, hx DVT. Here with husband. Last here Oct 17, 2009- note reviewed.  She denies any significant breathing issues since last here. Continues to use CPAP at 10 all night every night. Never recurrence of DVT left leg.  Noted minor cough when first lying down- they are not concerned.   10/22/11- 42 yoF former smoker followed for OSA and COPD, complicated by DM, hx breast cancer, hx DVT.... Here with husband. Denies any SOB, wheezing, cough, or congestion. Wears CPAP every night for 5-6 hours approx. Stable dyspnea on exertion with hills and stairs. Hospitalized from February 18 through 26 with pulmonary edema, history AFib, chronic Coumadin, history of DVT. Ejection fraction 25-30%.  01/29/12- 89 yoF former smoker followed for OSA and COPD, complicated by DM, hx breast cancer, hx DVT.... Here with husband. ACUTE VISIT: Increased SOB and worse at night; get raspy breathing prior to SOB starting up. This episode made her feel like her stomach was going to pop. Hospital 8/20 through 01/13/2012-right carotid endarterectomy/Dr. Bridgett Larsson. Occasional episodes all summer described as onset of shortness of breath in the evening after dinner when she is reading or watching TV or it she starts clearing her throat and feels bloated in the abdomen which makes her more short of breath. Worse if lying down. Denies fever, purulent discharge, palpitation or chest pain. 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 CPAP all night every night. She has been mis- using Advair, saving it for her episodes of dyspnea, so she uses it once at night with 2 or 3 activations. I reeducated her on use as a maintenance inhaler.  03/15/12- 19 yoF former smoker followed for OSA and  COPD, complicated by DM, hx breast cancer, hx DVT.... Here with husband.  Pt states that SOB has improved since last OV. No complaints She feels well and is walking more. Recognizes fluid retention causes shortness of breath. Her diuretic therapy helps. Pleased with CPAP 10/Advanced used all night every night. CXR 02/02/12-reviewed with her IMPRESSION:  Cardiomegaly. COPD/chronic changes. No active disease.  Original Report Authenticated By: Raelyn Number, M.D.   09/13/12- 79 yoF former smoker followed for OSA and COPD, complicated by DM, hx breast cancer, hx DVT.... Here with husband. FOLLOWS FOR:SOB at times and feelings of not being able to breathe-denies this happens with activity; states she goes to bed reading and about 30 minutes later she cant breathe and feels congestion. CPAP 10/ Advanced Is positional dyspnea happens about one time a month and is relieved by sitting up and waiting for it to go away.intends to start before she puts her CPAP on. She has been using either Dulera or Advair intermittently as rescue inhalers which we discussed. She emphasizes that she gets acutely short of breath and will grab for anything. She wasn't satisfied to discuss the difference between maintenance and rescue inhalers. She is being worked up for anemia. Glaucoma is treated with Timoptic which may cause bronchospasm. History of diastolic heart failure followed by cardiology. CXR 05/03/12 IMPRESSION:  Cardiac enlargement with pulmonary vascular congestion and  interstitial edema similar to previous study.  Original Report Authenticated By: Lucienne Capers, M.D.  11/14/12-  53 yoF former smoker followed for OSA and COPD, complicated by  DM, hx breast cancer, hx DVT, hx anemia.... Here with husband. FOLLWS FOR: review PFT with patient; Occasional SOB at times. Continues CPAP 10/ [employer] Reports little cough. BNP 09/13/12- 458, down from 3006 PFT- 11/14/2012-normal spirometry flows without response to  bronchodilator, normal lung volumes, diffusion moderately reduced/55% predicted CXR 09/13/12 IMPRESSION:  Enlargement of cardiac silhouette with pulmonary vascular  congestion.  Minimal chronic bronchitic changes with left basilar atelectasis.  Original Report Authenticated By: Lavonia Dana, M.D.  05/15/13- 63 yoF former smoker followed for OSA and COPD, complicated by DM, hx breast cancer, hx DVT, hx anemia.... Here with husband. FOLLOWS FOR: SOB and wheezing at times(happens all of a sudden); Has had to use Advair  If upset or nervous her breathing gets raspy and short of breath. She sits quietly and takes chlorazepate her breathing becomes comfortable again. Uses Proventil and Advair interchangeably as rescue inhalers-educated on this. Occasional prednisone, about 3 days a month, for arthralgias CPAP 10/Advanced good compliance and control  11/13/13- 80 yoF former smoker followed for OSA and COPD, complicated by DM, hx breast cancer, hx DVT, hx anemia.... Here with husband. FOLLOWS FOR:  Breathing is unchanged since last OV, has good and bad days with weather.  Wearing CPAP 10/ Advanced 8 hours per night She feels CPAP is doing well. Sleeps well. Occasional shortness of breath mostly with exertion. Prednisone 3 days per week for arthritis  05/15/14-78 yoF former smoker followed for OSA and COPD, complicated by DM, hx breast cancer, hx DVT, hx anemia.... Here with husband FOLLOW FOR: gets short of breath walking from one end to the other end of the house; she has a habit of holding her breath and has been trying to deliberately breath as she is moving about.  Weather has been causing her a lot of trouble breathing.   CPAP 10/ Advanced- won't sleep w/o. Persistent dyspnea on exertion 6 months, not progressive. Some better days. Occasionally little wheeze but not much cough, no chest pain. Occasional palpitation. Feet swell a little. Echocardiogram 03/01/2014-PAS 69, mild RAE, severe LAE Walk  test today on room air: Resting saturation 91-92%. Desaturated walking to 86% on room air Diffusion moderately reduced with normal spirometry flows on PFT - 2014.  07/16/14-78 yoF former smoker followed for OSA and COPD, complicated by DM, hx breast cancer, hx DVT, hx anemia.... Here with husband CPAP 10/ Advanced all night every night Follow up: SOB on exertion; no cough; sleeps approx 7hrs nightly on CPAP. She is doing well with CPAP. Desaturated last visit on walk test. D-dimer was elevated 1.11 V/Q scan done because of renal function, was low prob.  01/14/15- 78 yoF former smoker followed for OSA and COPD, complicated by DM, hx breast cancer, hx DVT, hx anemia.... Here with husband FOLLOWS FOR: Pt states she is wearing CPAP 10/ Advanced nightly for about  hours per night. Pt denies issues with mask, pressure, or machine. Pt denies any complaints at this time.  We discussed pulmonary vascular congestion consistent with cardiogenic fluid overload last winter as seen on CXR from December- reviewed. She knows to contact cardiology if this happens again.  CXR 05/21/14  IMPRESSION: 1. Cardiomegaly with pulmonary venous congestion. No change PA 2. Stable small focus of scarring left mid lung field. Electronically Signed  By: Marcello Moores Register  On: 05/21/2014 09:31  07/22/2015-80 year old female former smoker followed for OSA, COPD, complicated by DM, history R breast cancer, history DVT, anemia, PAF, NSTEMI/ CHF, CKD Husband here-needed our help to get her out  of the car into the office CPAP 10/Advanced  FOLLOWS FOR: Pt wears CPAP every night for about 7 hours; DME: [facility]. Pt will need supplies and DL as no cord brought with CPAP machine today.  She did not bring power cord for her machine so we could not get download at this visit. Husband endorses that she is faithful with her CPAP, using it all night every night.  Dyspnea room to room with no acute event. Feels that her legs give out and she  gets more short of breath. She is comfortable sitting or lying in bed. Weight gain 11 pounds over the past year. Denies cough. May notice a little occasional wheeze. Legs swelling. She remains on Coumadin managed by Coumadin clinic. Latest INR 2.4 on 07/16/2015. Walk Test on room air 07/22/2015-desaturated to 84% walking, from baseline 96% on arrival at rest. Office Spirometry 07/22/2015-moderate restriction of exhaled volume. FVC 1.74/64%, FEV1 1.31/65%, FEV1/FVC 0.75 CXR 07/22/2015-diffuse peribronchial cuffing suggestive of acute bronchitis, moderate cardiomegaly, atherosclerosis. I reviewed the images and suspect part of we are seeing is interstitial edema from mild CHF  ROS-see HPI Constitutional:   No-   weight loss, night sweats, fevers, chills, fatigue, lassitude. HEENT:   No-  headaches, difficulty swallowing, tooth/dental problems, sore throat,       No-  sneezing, itching, ear ache, nasal congestion, post nasal drip,  CV:  No-   chest pain, +orthopnea, PND, swelling in lower extremities, anasarca, dizziness, palpitations Resp: +shortness of breath with exertion or at rest.              No-   productive cough,  No non-productive cough,  No- coughing up of blood.              No-   change in color of mucus.  No- wheezing.   Skin: No-   rash or lesions. GI:  No-   heartburn, indigestion, abdominal pain, nausea, vomiting,  GU:  MS:  + joint pain or swelling.   Neuro-     nothing unusual Psych:  No- change in mood or affect. No depression or anxiety.  No memory loss.  OBJ- Physical Exam General- Alert, Oriented, Affect-appropriate, Distress- none acute, overweight Skin- rash-none, lesions- none, excoriation- none Lymphadenopathy- none Head- atraumatic            Eyes- Gross vision intact, PERRLA, conjunctivae and secretions clear            Ears- +Hearing seems reduced            Nose- Clear, no-Septal dev, mucus, polyps, erosion, perforation             Throat- Mallampati II ,  mucosa clear , drainage- none, tonsils- atrophic Neck- flexible , trachea midline, no stridor , thyroid nl, + Bilateral carotid scars Chest - symmetrical excursion , unlabored           Heart/CV- RRR , [id number] systolic Aortic murmur , no gallop  , no rub, nl s1 s2                           - JVD- none , edema+3, stasis changes- none, varices- none           Lung- clear, wheeze- none, cough- none , dullness-none, rub- none           Chest wall-  Abd-  Br/ Gen/ Rectal- Not done, not indicated Extrem- cyanosis- none, clubbing, none, atrophy- none,  strength- nl, Homans negative  Neuro- grossly intact to observation

## 2015-07-22 NOTE — Telephone Encounter (Signed)
-----   Message from Pieter Partridge, DO sent at 07/19/2015  4:14 PM EST ----- No aneurysm

## 2015-07-22 NOTE — Telephone Encounter (Signed)
Left message on machine for pt to return call to the office.  

## 2015-07-23 ENCOUNTER — Encounter (HOSPITAL_COMMUNITY): Payer: Self-pay

## 2015-07-23 ENCOUNTER — Emergency Department (HOSPITAL_COMMUNITY): Payer: Medicare Other

## 2015-07-23 ENCOUNTER — Observation Stay (HOSPITAL_COMMUNITY)
Admission: EM | Admit: 2015-07-23 | Discharge: 2015-07-27 | Disposition: A | Discharge status: Home / Self Care | Payer: Medicare Other | Attending: Internal Medicine | Admitting: Internal Medicine

## 2015-07-23 DIAGNOSIS — N183 Chronic kidney disease, stage 3 unspecified: Secondary | ICD-10-CM | POA: Diagnosis present

## 2015-07-23 DIAGNOSIS — Z6833 Body mass index (BMI) 33.0-33.9, adult: Secondary | ICD-10-CM | POA: Insufficient documentation

## 2015-07-23 DIAGNOSIS — F329 Major depressive disorder, single episode, unspecified: Secondary | ICD-10-CM | POA: Diagnosis not present

## 2015-07-23 DIAGNOSIS — G4733 Obstructive sleep apnea (adult) (pediatric): Secondary | ICD-10-CM | POA: Diagnosis present

## 2015-07-23 DIAGNOSIS — Z86718 Personal history of other venous thrombosis and embolism: Secondary | ICD-10-CM | POA: Insufficient documentation

## 2015-07-23 DIAGNOSIS — E118 Type 2 diabetes mellitus with unspecified complications: Secondary | ICD-10-CM

## 2015-07-23 DIAGNOSIS — I130 Hypertensive heart and chronic kidney disease with heart failure and stage 1 through stage 4 chronic kidney disease, or unspecified chronic kidney disease: Secondary | ICD-10-CM | POA: Insufficient documentation

## 2015-07-23 DIAGNOSIS — I10 Essential (primary) hypertension: Secondary | ICD-10-CM | POA: Diagnosis present

## 2015-07-23 DIAGNOSIS — I428 Other cardiomyopathies: Secondary | ICD-10-CM | POA: Diagnosis not present

## 2015-07-23 DIAGNOSIS — Z9011 Acquired absence of right breast and nipple: Secondary | ICD-10-CM | POA: Diagnosis not present

## 2015-07-23 DIAGNOSIS — E039 Hypothyroidism, unspecified: Secondary | ICD-10-CM | POA: Insufficient documentation

## 2015-07-23 DIAGNOSIS — Z8673 Personal history of transient ischemic attack (TIA), and cerebral infarction without residual deficits: Secondary | ICD-10-CM | POA: Diagnosis not present

## 2015-07-23 DIAGNOSIS — R55 Syncope and collapse: Secondary | ICD-10-CM | POA: Diagnosis not present

## 2015-07-23 DIAGNOSIS — J441 Chronic obstructive pulmonary disease with (acute) exacerbation: Secondary | ICD-10-CM | POA: Diagnosis not present

## 2015-07-23 DIAGNOSIS — M109 Gout, unspecified: Secondary | ICD-10-CM | POA: Diagnosis not present

## 2015-07-23 DIAGNOSIS — I5023 Acute on chronic systolic (congestive) heart failure: Secondary | ICD-10-CM | POA: Diagnosis not present

## 2015-07-23 DIAGNOSIS — Z7952 Long term (current) use of systemic steroids: Secondary | ICD-10-CM | POA: Diagnosis not present

## 2015-07-23 DIAGNOSIS — I480 Paroxysmal atrial fibrillation: Secondary | ICD-10-CM | POA: Diagnosis present

## 2015-07-23 DIAGNOSIS — E669 Obesity, unspecified: Secondary | ICD-10-CM | POA: Insufficient documentation

## 2015-07-23 DIAGNOSIS — E1151 Type 2 diabetes mellitus with diabetic peripheral angiopathy without gangrene: Secondary | ICD-10-CM | POA: Diagnosis not present

## 2015-07-23 DIAGNOSIS — Z9989 Dependence on other enabling machines and devices: Secondary | ICD-10-CM

## 2015-07-23 DIAGNOSIS — Z87891 Personal history of nicotine dependence: Secondary | ICD-10-CM | POA: Insufficient documentation

## 2015-07-23 DIAGNOSIS — Z853 Personal history of malignant neoplasm of breast: Secondary | ICD-10-CM | POA: Diagnosis not present

## 2015-07-23 DIAGNOSIS — E785 Hyperlipidemia, unspecified: Secondary | ICD-10-CM | POA: Diagnosis not present

## 2015-07-23 DIAGNOSIS — Z7901 Long term (current) use of anticoagulants: Secondary | ICD-10-CM | POA: Diagnosis not present

## 2015-07-23 DIAGNOSIS — E1122 Type 2 diabetes mellitus with diabetic chronic kidney disease: Secondary | ICD-10-CM | POA: Diagnosis not present

## 2015-07-23 DIAGNOSIS — I5022 Chronic systolic (congestive) heart failure: Secondary | ICD-10-CM | POA: Diagnosis not present

## 2015-07-23 DIAGNOSIS — Z79899 Other long term (current) drug therapy: Secondary | ICD-10-CM | POA: Insufficient documentation

## 2015-07-23 DIAGNOSIS — I252 Old myocardial infarction: Secondary | ICD-10-CM | POA: Insufficient documentation

## 2015-07-23 DIAGNOSIS — D649 Anemia, unspecified: Secondary | ICD-10-CM | POA: Diagnosis not present

## 2015-07-23 DIAGNOSIS — E119 Type 2 diabetes mellitus without complications: Secondary | ICD-10-CM

## 2015-07-23 LAB — COMPREHENSIVE METABOLIC PANEL
ALBUMIN: 3.1 g/dL — AB (ref 3.5–5.0)
ALK PHOS: 63 U/L (ref 38–126)
ALT: 11 U/L — ABNORMAL LOW (ref 14–54)
AST: 17 U/L (ref 15–41)
Anion gap: 8 (ref 5–15)
BILIRUBIN TOTAL: 0.6 mg/dL (ref 0.3–1.2)
BUN: 30 mg/dL — AB (ref 6–20)
CO2: 21 mmol/L — ABNORMAL LOW (ref 22–32)
CREATININE: 2.16 mg/dL — AB (ref 0.44–1.00)
Calcium: 8.8 mg/dL — ABNORMAL LOW (ref 8.9–10.3)
Chloride: 112 mmol/L — ABNORMAL HIGH (ref 101–111)
GFR calc Af Amer: 24 mL/min — ABNORMAL LOW (ref >60)
GFR, EST NON AFRICAN AMERICAN: 21 mL/min — AB (ref >60)
GLUCOSE: 128 mg/dL — AB (ref 65–99)
POTASSIUM: 4.7 mmol/L (ref 3.5–5.1)
SODIUM: 141 mmol/L (ref 135–145)
TOTAL PROTEIN: 5.9 g/dL — AB (ref 6.5–8.1)

## 2015-07-23 LAB — CBC WITH DIFFERENTIAL/PLATELET
BASOS PCT: 0 %
Basophils Absolute: 0 10*3/uL (ref 0.0–0.1)
EOS ABS: 0.1 10*3/uL (ref 0.0–0.7)
EOS PCT: 1 %
HCT: 29.7 % — ABNORMAL LOW (ref 36.0–46.0)
Hemoglobin: 8.3 g/dL — ABNORMAL LOW (ref 12.0–15.0)
LYMPHS ABS: 0.5 10*3/uL — AB (ref 0.7–4.0)
Lymphocytes Relative: 5 %
MCH: 21.6 pg — AB (ref 26.0–34.0)
MCHC: 27.9 g/dL — AB (ref 30.0–36.0)
MCV: 77.3 fL — ABNORMAL LOW (ref 78.0–100.0)
MONOS PCT: 5 %
Monocytes Absolute: 0.4 10*3/uL (ref 0.1–1.0)
Neutro Abs: 7.5 10*3/uL (ref 1.7–7.7)
Neutrophils Relative %: 89 %
PLATELETS: 298 10*3/uL (ref 150–400)
RBC: 3.84 MIL/uL — ABNORMAL LOW (ref 3.87–5.11)
RDW: 17.5 % — AB (ref 11.5–15.5)
WBC: 8.5 10*3/uL (ref 4.0–10.5)

## 2015-07-23 LAB — BRAIN NATRIURETIC PEPTIDE: B Natriuretic Peptide: 2008.1 pg/mL — ABNORMAL HIGH (ref 0.0–100.0)

## 2015-07-23 LAB — PROTIME-INR
INR: 2.31 — ABNORMAL HIGH (ref 0.00–1.49)
PROTHROMBIN TIME: 25.2 s — AB (ref 11.6–15.2)

## 2015-07-23 LAB — D-DIMER, QUANTITATIVE: D-Dimer, Quant: 1.48 ug/mL-FEU — ABNORMAL HIGH (ref 0.00–0.48)

## 2015-07-23 LAB — I-STAT TROPONIN, ED: Troponin i, poc: 0.03 ng/mL (ref 0.00–0.08)

## 2015-07-23 MED ORDER — SODIUM CHLORIDE 0.9 % IV SOLN
INTRAVENOUS | Status: DC
  Administered 2015-07-24: 01:00:00 via INTRAVENOUS

## 2015-07-23 MED ORDER — ALLOPURINOL 100 MG PO TABS
100.0000 mg | ORAL_TABLET | Freq: Three times a day (TID) | ORAL | Status: DC
  Administered 2015-07-24 – 2015-07-27 (×11): 100 mg via ORAL
  Filled 2015-07-23 (×11): qty 1

## 2015-07-23 MED ORDER — HYDROMORPHONE HCL 1 MG/ML IJ SOLN
0.5000 mg | INTRAMUSCULAR | Status: DC | PRN
  Administered 2015-07-24 (×2): 1 mg via INTRAVENOUS
  Filled 2015-07-23 (×2): qty 1

## 2015-07-23 MED ORDER — GABAPENTIN 100 MG PO CAPS
200.0000 mg | ORAL_CAPSULE | Freq: Two times a day (BID) | ORAL | Status: DC
  Administered 2015-07-24 – 2015-07-27 (×8): 200 mg via ORAL
  Filled 2015-07-23 (×9): qty 2

## 2015-07-23 MED ORDER — ONDANSETRON HCL 4 MG/2ML IJ SOLN: 4.0000 mg | Freq: Four times a day (QID) | INTRAMUSCULAR | Status: DC | PRN

## 2015-07-23 MED ORDER — CARVEDILOL 12.5 MG PO TABS
25.0000 mg | ORAL_TABLET | Freq: Every day | ORAL | Status: DC
  Administered 2015-07-24 – 2015-07-27 (×4): 25 mg via ORAL
  Filled 2015-07-23 (×4): qty 2

## 2015-07-23 MED ORDER — HYDRALAZINE HCL 25 MG PO TABS
25.0000 mg | ORAL_TABLET | Freq: Three times a day (TID) | ORAL | Status: DC
  Administered 2015-07-24 (×3): 25 mg via ORAL
  Filled 2015-07-23 (×3): qty 1

## 2015-07-23 MED ORDER — SODIUM CHLORIDE 0.9% FLUSH
3.0000 mL | Freq: Two times a day (BID) | INTRAVENOUS | Status: DC
  Administered 2015-07-23 – 2015-07-27 (×6): 3 mL via INTRAVENOUS

## 2015-07-23 MED ORDER — CLORAZEPATE DIPOTASSIUM 7.5 MG PO TABS: 7.5000 mg | ORAL_TABLET | Freq: Two times a day (BID) | ORAL | Status: DC | PRN

## 2015-07-23 MED ORDER — VITAMIN D 1000 UNITS PO TABS
1000.0000 [IU] | ORAL_TABLET | Freq: Two times a day (BID) | ORAL | Status: DC
  Administered 2015-07-24 – 2015-07-27 (×8): 1000 [IU] via ORAL
  Filled 2015-07-23 (×8): qty 1

## 2015-07-23 MED ORDER — CARVEDILOL 12.5 MG PO TABS
12.5000 mg | ORAL_TABLET | Freq: Every day | ORAL | Status: DC
  Administered 2015-07-24 – 2015-07-26 (×3): 12.5 mg via ORAL
  Filled 2015-07-23 (×3): qty 1

## 2015-07-23 MED ORDER — ONDANSETRON HCL 4 MG PO TABS: 4.0000 mg | ORAL_TABLET | Freq: Four times a day (QID) | ORAL | Status: DC | PRN

## 2015-07-23 MED ORDER — LEVOTHYROXINE SODIUM 25 MCG PO TABS
137.0000 ug | ORAL_TABLET | Freq: Every day | ORAL | Status: DC
  Administered 2015-07-24 – 2015-07-27 (×4): 137 ug via ORAL
  Filled 2015-07-23 (×5): qty 1

## 2015-07-23 MED ORDER — TORSEMIDE 20 MG PO TABS: 20.0000 mg | ORAL_TABLET | Freq: Every day | ORAL | Status: DC | PRN

## 2015-07-23 MED ORDER — ACETAMINOPHEN 325 MG PO TABS
650.0000 mg | ORAL_TABLET | Freq: Four times a day (QID) | ORAL | Status: DC | PRN
  Administered 2015-07-24 – 2015-07-27 (×3): 650 mg via ORAL
  Filled 2015-07-23 (×3): qty 2

## 2015-07-23 MED ORDER — OXYCODONE HCL 5 MG PO TABS
5.0000 mg | ORAL_TABLET | ORAL | Status: DC | PRN
Start: 2015-07-23 — End: 2015-07-26
  Administered 2015-07-24 – 2015-07-25 (×2): 5 mg via ORAL
  Filled 2015-07-23 (×2): qty 1

## 2015-07-23 MED ORDER — TIMOLOL MALEATE 0.5 % OP SOLN
1.0000 [drp] | Freq: Two times a day (BID) | OPHTHALMIC | Status: DC
  Administered 2015-07-24 – 2015-07-27 (×8): 1 [drp] via OPHTHALMIC
  Filled 2015-07-23: qty 5

## 2015-07-23 MED ORDER — ACETAMINOPHEN 650 MG RE SUPP: 650.0000 mg | Freq: Four times a day (QID) | RECTAL | Status: DC | PRN

## 2015-07-23 MED ORDER — WARFARIN - PHYSICIAN DOSING INPATIENT
Freq: Every day | Status: DC
  Administered 2015-07-24 – 2015-07-26 (×3)

## 2015-07-23 MED ORDER — ALUM & MAG HYDROXIDE-SIMETH 200-200-20 MG/5ML PO SUSP: 30.0000 mL | Freq: Four times a day (QID) | ORAL | Status: DC | PRN

## 2015-07-23 MED ORDER — BRIMONIDINE TARTRATE 0.2 % OP SOLN
1.0000 [drp] | Freq: Every day | OPHTHALMIC | Status: DC
  Administered 2015-07-24 – 2015-07-27 (×4): 1 [drp] via OPHTHALMIC
  Filled 2015-07-23: qty 5

## 2015-07-23 MED ORDER — PREDNISOLONE ACETATE 1 % OP SUSP: 1.0000 [drp] | OPHTHALMIC | Status: DC | PRN

## 2015-07-23 MED ORDER — ISOSORBIDE MONONITRATE ER 60 MG PO TB24
60.0000 mg | ORAL_TABLET | Freq: Every day | ORAL | Status: DC
  Administered 2015-07-24 – 2015-07-27 (×4): 60 mg via ORAL
  Filled 2015-07-23 (×4): qty 1

## 2015-07-23 MED ORDER — WARFARIN SODIUM 3 MG PO TABS
3.0000 mg | ORAL_TABLET | Freq: Once | ORAL | Status: AC
Start: 2015-07-23 — End: 2015-07-24
  Administered 2015-07-24: 3 mg via ORAL
  Filled 2015-07-23: qty 1

## 2015-07-23 NOTE — Assessment & Plan Note (Signed)
Probably increased dyspnea and leg edema reflects worse congestive heart failure. Plan-chest x-ray, BNP, see cardiology

## 2015-07-23 NOTE — H&P (Signed)
[facility] Admission History and Physical       Wendy Cole [id number] DOB: 19-Jan-1936 DOA: 07/23/2015  Referring physician: EDP PCP: Walker Kehr, MD  Specialists:   Chief Complaint: Passed Out  HPI: Wendy Cole is a 80 y.o. female with a history of CAD, Paroxysmal Atrial Fibrillation on Warfarin Rx, CHF, HTN, Stage III CKD, Hypothyroid, OSA and hx of Breast Cancer S/P Rt. Mastectomy who presents to the ED with complaints of passing out at home.  Her syncopal event was witnessed by family, and she reports that she was reaching and leaning over to get a sheet out of a cabinet and then blacked out.    Her family reports that she was unconscious for 15 to 30 seconds.    She denies having any prodrome.  She denies having any chest pain.     Review of Systems:  Constitutional: No Weight Loss, No Weight Gain, Night Sweats, Fevers, Chills, Dizziness, Light Headedness, Fatigue, or Generalized Weakness HEENT: No Headaches, Difficulty Swallowing,Tooth/Dental Problems,Sore Throat,  No Sneezing, Rhinitis, Ear Ache, Nasal Congestion, or Post Nasal Drip,  Cardio-vascular:  No Chest pain, Orthopnea, PND, Edema in Lower Extremities, Anasarca, Dizziness, Palpitations  Resp: No Dyspnea, No DOE, No Productive Cough, No Non-Productive Cough, No Hemoptysis, No Wheezing.    GI: No Heartburn, Indigestion, Abdominal Pain, Nausea, Vomiting, Diarrhea, Constipation, Hematemesis, Hematochezia, Melena, Change in Bowel Habits,  Loss of Appetite  GU: No Dysuria, No Change in Color of Urine, No Urgency or Urinary Frequency, No Flank pain.  Musculoskeletal: No Joint Pain or Swelling, No Decreased Range of Motion, No Back Pain.  Neurologic: +Syncope, No Seizures, Muscle Weakness, Paresthesia, Vision Disturbance or Loss, No Diplopia, No Vertigo, No Difficulty Walking,  Skin: No Rash or Lesions. Psych: No Change in Mood or Affect, No Depression or Anxiety, No Memory loss, No Confusion, or  Hallucinations   Past Medical History  Diagnosis Date  . Anemia   . Anxiety states   . Depressive disorder, not elsewhere classified   . Type II or unspecified type diabetes mellitus without mention of complication, not stated as uncontrolled   . Hyperlipidemia   . Unspecified essential hypertension   . Renal insufficiency   . Osteoarthritis   . Gout   . Iritis   . Nonischemic cardiomyopathy (HCC)     EF 20 to 25% per echo 06/2011  . Chronic anticoagulation   . NSTEMI (non-ST elevated myocardial infarction) [facility]) Feb 2013    06/2011 cath  Mild nonobstructive disease. No LV gram  . PAF (paroxysmal atrial fibrillation) ([place])   . Obesity   . Cancer [facility]) 2010    Right breast  . DVT (deep venous thrombosis) ([place]) 2010  . Stroke [facility]) 2004    Left upper lobe  . Thyroid disease 1969    Three fourth of Thyroid removed  . Complication of anesthesia     "fights it", for colonoscopy  . Flash pulmonary edema ([place])     06/2011  . HOH (hard of hearing)     deaf in L completely  . OSA on CPAP     CPAP at night  . GERD (gastroesophageal reflux disease)   . Hypothyroidism   . Chronic kidney disease (CKD), stage III (moderate)   . Peripheral vascular disease ([place]) Jan. 2015    Left leg pain and swelling     Past Surgical History  Procedure Laterality Date  . Breast lumpectomy    . Cataract extraction    .  Foot surgery    . Tonsillectomy  1949  . Abdominal hysterectomy  1974  . Eye surgery  2008    Glaucoma shunt Right eye  . Parotid gland tumor excision  2000  . Cystectomy  1974    Left Breast, chin, left of groin area  . Thyroidectomy, partial  1969  . Endarterectomy  11/18/2011    Procedure: ENDARTERECTOMY CAROTID;  Surgeon: Conrad Tracyton, MD;  Location: [place];  Service: Vascular;  Laterality: Left;  [name] Mastectomy  right  . Mastectomy    . Cardiac catheterization      no stent  . Colonoscopy w/ polypectomy    . Endarterectomy  01/12/2012    Procedure: ENDARTERECTOMY  CAROTID;  Surgeon: Conrad , MD;  [facility];  Service: Vascular;  Laterality: Right;  Right carotid endarterectomy with 1cm x 6cm vascuguard patch angioplasty.  . Carotid endarterectomy Left 11-18-11    cea  . Carotid endarterectomy Right 01-12-12    cea  . Left and right heart catheterization with coronary/graft angiogram Left 07/20/2011    Procedure: LEFT AND RIGHT HEART CATHETERIZATION WITH Beatrix Fetters;  Surgeon: Peter M Martinique, MD;  Location: [facility] CATH LAB;  Service: Cardiovascular;  Laterality: Left;      Prior to Admission medications   Medication Sig Start Date End Date Taking? Authorizing Provider  acetaminophen (TYLENOL) 500 MG tablet Take 500 mg by mouth daily as needed for mild pain, moderate pain or headache.    Yes Historical Provider, MD  albuterol (PROVENTIL HFA;VENTOLIN HFA) 108 (90 BASE) MCG/ACT inhaler Inhale 2 puffs into the lungs every 6 (six) hours as needed for wheezing or shortness of breath. Patient taking differently: Inhale 2 puffs into the lungs at bedtime.  09/15/12  Yes Deneise Lever, MD  allopurinol (ZYLOPRIM) 100 MG tablet Take 100 mg by mouth three times weekly. 12/13/12  Yes Aleksei Plotnikov V, MD  brimonidine (ALPHAGAN) 0.15 % ophthalmic solution Place 1 drop into both eyes daily.    Yes Historical Provider, MD  buPROPion (WELLBUTRIN SR) 150 MG 12 hr tablet Take 150 mg by mouth daily.  05/22/14  Yes Historical Provider, MD  carvedilol (COREG) 12.5 MG tablet Take 12.5-25 mg by mouth 2 (two) times daily with a meal. Take 2 tabs by mouth in the am & 1 tab by mouth in the pm   Yes Historical Provider, MD  cholecalciferol (VITAMIN D) 1000 units tablet Take 1,000 Units by mouth 2 (two) times daily.   Yes Historical Provider, MD  clorazepate (TRANXENE) 7.5 MG tablet Take 1 tablet (7.5 mg total) by mouth 2 (two) times daily as needed for anxiety. 01/21/15  Yes Aleksei Plotnikov V, MD  Fluticasone-Salmeterol (ADVAIR DISKUS) 100-50 MCG/DOSE AEPB Inhale 1  puff into the lungs every 12 (twelve) hours. Discontinue all previous refills for this medication. Hold/Fill when appropriate according to pt rx fill schedule. 01/21/15  Yes Aleksei Plotnikov V, MD  gabapentin (NEURONTIN) 100 MG capsule 2 caps twice daily Patient taking differently: Take 200 mg by mouth 2 (two) times daily. 2 caps twice daily 07/16/15  Yes Aleksei Plotnikov V, MD  hydrALAZINE (APRESOLINE) 25 MG tablet TAKE 1 TABLET (25 MG TOTAL) BY MOUTH 3 (THREE) TIMES DAILY. Patient taking differently: Take 25 mg by mouth 3 (three) times daily. TAKE 1 TABLET (25 MG TOTAL) BY MOUTH 3 (THREE) TIMES DAILY. 04/26/15  Yes Josue Hector, MD  isosorbide mononitrate (IMDUR) 60 MG 24 hr tablet TAKE 1 TABLET (60 MG TOTAL)  BY MOUTH DAILY. 05/21/15  Yes Josue Hector, MD  levothyroxine (SYNTHROID, LEVOTHROID) 137 MCG tablet TAKE 1 TABLET BY MOUTH DAILY 03/12/15  Yes Aleksei Plotnikov V, MD  prednisoLONE acetate (PRED FORTE) 1 % ophthalmic suspension Place 1 drop into both eyes as needed (eye inflammation). For eye pain   Yes Historical Provider, MD  predniSONE (DELTASONE) 5 MG tablet Take 1 tablet (5 mg total) by mouth daily with breakfast. Prednisone: take a total of 5 mg/d pc 04/12/15  Yes Aleksei Plotnikov V, MD  timolol (TIMOPTIC) 0.5 % ophthalmic solution Place 1 drop into both eyes 2 (two) times daily.    Yes Historical Provider, MD  torsemide (DEMADEX) 20 MG tablet Take 2 tablets (40 mg total) by mouth daily as needed (for edema). Patient taking differently: Take 20 mg by mouth daily as needed (for edema).  07/16/15  Yes Aleksei Plotnikov V, MD  traMADol (ULTRAM) 50 MG tablet TAKE 1-2 TABLETS BY MOUTH 2 TIMES DAILY AS NEEDED FOR MODERATE OR SEVERE PAIN 06/28/15  Yes Aleksei Plotnikov V, MD  warfarin (COUMADIN) 3 MG tablet TAKE AS DIRECTED BY ANTICOAGULATION CLINIC Patient taking differently: Take 3 mg by mouth daily at bedtime. 06/06/15  Yes Aleksei Plotnikov V, MD  metolazone (ZAROXOLYN) 2.5 MG tablet Take 1  tablet (2.5 mg total) by mouth daily. Patient not taking: Reported on 07/23/2015 06/27/15   Josue Hector, MD     Allergies  Allergen Reactions  . Amlodipine     edema  . Atorvastatin Other (See Comments)    Muscle aches  . Colesevelam Other (See Comments)    unknown  . Coumadin [Warfarin Sodium]     "causes cramps" brand name only per pt  . Lasix [Furosemide] Other (See Comments)    Muscle cramps  . Statins Other (See Comments)    Muscle aches  . Tape Rash    Social History:  reports that she quit smoking about 13 years ago. Her smoking use included Cigarettes. She has a 80 pack-year smoking history. She has never used smokeless tobacco. She reports that she does not drink alcohol or use illicit drugs.    Family History  Problem Relation Age of Onset  . Hypertension    . Congestive Heart Failure Mother 42  . Breast cancer Mother   . Heart attack Mother   . Heart disease Father 90  . Heart disease Brother   . Coronary artery disease Son 40    2 Stents  . Heart attack Father   . AAA (abdominal aortic aneurysm) Brother        Physical Exam:  GEN:  Pleasant Elderly Obese  80 y.o. Caucasian female examined and in no acute distress; cooperative with exam Filed Vitals:   07/23/15 2008 07/23/15 2030 07/23/15 2100 07/23/15 2130  BP: 109/75 143/80 202/68 207/70  Pulse: 72 66 67 68  Resp: 20 13 13 12   SpO2: 94% 96% 96% 94%   Blood pressure 207/70, pulse 68, resp. rate 12, SpO2 94 %. PSYCH: She is alert and oriented x4; does not appear anxious does not appear depressed; affect is normal HEENT: Normocephalic and Atraumatic, Mucous membranes pink; PERRLA; EOM intact; Fundi:  Benign;  No scleral icterus, Nares: Patent, Oropharynx: Clear, Fair Dentition,    Neck:  FROM, No Cervical Lymphadenopathy nor Thyromegaly or Carotid Bruit; No JVD; Breasts:: Not examined CHEST WALL: No tenderness CHEST: Normal respiration, clear to auscultation bilaterally HEART: Regular rate and rhythm;  no murmurs rubs or gallops BACK: No  kyphosis or scoliosis; No CVA tenderness ABDOMEN: Positive Bowel Sounds,Obese, Soft Non-Tender, No Rebound or Guarding; No Masses, No Organomegaly. Rectal Exam: Not done EXTREMITIES: No  Cyanosis, Clubbing, 2-3+ BLE Edema; No Ulcerations. Genitalia: not examined PULSES: 2+ and symmetric SKIN: Normal hydration no rash or ulceration CNS:  Alert and Oriented x 4, No Focal Deficits Vascular: pulses palpable throughout    Labs on Admission:  Basic Metabolic Panel:  Recent Labs Lab 07/18/15 1503 07/23/15 1912  NA 137 141  K 4.4 4.7  CL 106 112*  CO2 19* 21*  GLUCOSE 97 128*  BUN 32* 30*  CREATININE 2.12* 2.16*  CALCIUM 8.5* 8.8*   Liver Function Tests:  Recent Labs Lab 07/23/15 1912  AST 17  ALT 11*  ALKPHOS 63  BILITOT 0.6  PROT 5.9*  ALBUMIN 3.1*   No results for input(s): LIPASE, AMYLASE in the last 168 hours. No results for input(s): AMMONIA in the last 168 hours. CBC:  Recent Labs Lab 07/23/15 1912  WBC 8.5  NEUTROABS 7.5  HGB 8.3*  HCT 29.7*  MCV 77.3*  PLT 298   Cardiac Enzymes: No results for input(s): CKTOTAL, CKMB, CKMBINDEX, TROPONINI in the last 168 hours.  BNP (last 3 results)  Recent Labs  07/23/15 1912  BNP 2008.1*    ProBNP (last 3 results)  Recent Labs  08/21/14 1318 07/22/15 1503  PROBNP 1437.0* >5000.0*    CBG: No results for input(s): GLUCAP in the last 168 hours.  Radiological Exams on Admission: Dg Chest 2 View  07/23/2015  CLINICAL DATA:  Syncope today, pt fell on her floor while attempting to bend over and pick up something. Pt denies chest pain and SOB. Hx of HTN, DM, CA, renal insufficiency, NSTEMI, DVT, PAF, GERD, flash pulmonary edema, CKD. Ex-smoker, quit in 2004. EXAM: CHEST  2 VIEW COMPARISON:  Prior chest radiographs, most recent dated 07/22/2015 FINDINGS: Cardiac enlargement is stable. No mediastinal or hilar masses or evidence of adenopathy. Lungs are hyperexpanded and show  thickened interstitial markings. There is linear opacity in the left upper lobe lingula consistent with scarring. No evidence of pneumonia or pulmonary edema. No pleural effusion or pneumothorax. Bony thorax is demineralized but grossly intact. No significant change from most recent prior study. IMPRESSION: 1. No acute cardiopulmonary disease and no significant change from the previous day's study. Electronically Signed   By: Lajean Manes M.D.   On: 07/23/2015 17:59   Dg Chest 2 View  07/22/2015  CLINICAL DATA:  80 year old female with shortness breath on exertion for the past 2 months. Hypertension. Previous smoker. EXAM: CHEST  2 VIEW COMPARISON:  Chest x-ray 05/21/2014. FINDINGS: Diffuse peribronchial cuffing. Lungs are hyperexpanded with flattening of the hemidiaphragms and increased retrosternal airspace. No acute consolidative airspace disease. Linear areas of scarring throughout the left mid to lower lung, similar to the prior study. No pleural effusions. No evidence of pulmonary edema. Heart size is mild transient heart size is moderately enlarged. Atherosclerosis in the thoracic aorta. IMPRESSION: 1. Diffuse peribronchial cuffing, suggestive of acute bronchitis. 2. Moderate cardiomegaly. 3. Atherosclerosis. Electronically Signed   By: Vinnie Langton M.D.   On: 07/22/2015 15:34     EKG: Independently reviewed. Sinus Bradycardia rate = 59, Old Inferior and Anterior Infarct Changes     Assessment/Plan:      80 y.o. female with  Active Problems:    Syncope   Telemetry Monitoring   Check Orthostatics       PAF (paroxysmal atrial fibrillation) (HCC)  Continue Warfarin Rx Adjust PRN         Chronic anticoagulation   Continue Wararin Rx   PT/INR daily   Adjust dose PRN        Chronic systolic CHF (congestive heart failure), NYHA class 3- EF 25-30%   Continue Torsemide, Carvedilol, Hydralazine and Imdur Rx    CKD (chronic kidney disease) stage 3, GFR 30-59 ml/min   Monuitor  BUN/Cr    Diabetes type 2, controlled (HCC)   SSI coverage PRN        Dyslipidemia   Not on RX   Intolerant to Statins      Essential hypertension   Continue Torsemide, Carvedilol, Hydralazine and Imdur Rx   Monitor BPs     OSA on CPAP   CPAP qhs        DVT Prophylaxis   On Warfarin Rx     Code Status:     FULL CODE  Family Communication:   No Family Present    Disposition Plan:    Observation Status with Expected LOS 1-2 days   Time spent: 32 Minutes      Brentin Shin C [facility] Pager [phone]   If 7AM -7PM Please Contact the Day Rounding Team MD for [facility]  If 7PM-7AM, Please Contact Night-Floor Coverage  [email or web address] Password [facility] 07/23/2015, 10:11 PM     ADDENDUM:   Patient was seen and examined on 07/23/2015

## 2015-07-23 NOTE — ED Notes (Signed)
Pt is upset states "it is so stupid this god damn cuff gets to tight it will be wrong you need to take this god damn cuff off now."

## 2015-07-23 NOTE — Assessment & Plan Note (Addendum)
Office spirometry indicates moderate restriction without measured lung volumes or diffusion capacity. This would fit with heart failure plus her known obesity Plan-BNP, overnight oximetry, recommend she see her cardiologist

## 2015-07-23 NOTE — Assessment & Plan Note (Signed)
Warfarin has been managed in therapeutic range by Coumadin clinic. Doubt new PE to explain dyspnea.

## 2015-07-23 NOTE — ED Notes (Signed)
This nurse requested pt's husband not stand in the hallway.  Gave him a sandwich and directed him back to pt's room.

## 2015-07-23 NOTE — ED Notes (Signed)
Report attempted 

## 2015-07-23 NOTE — ED Provider Notes (Signed)
CSN: [id number]     Arrival date & time 07/23/15  1642 History   First MD Initiated Contact with Patient 07/23/15 1650     Chief Complaint  Patient presents with  . Loss of Consciousness     (Consider location/radiation/quality/duration/timing/severity/associated sxs/prior Treatment) Patient is a 80 y.o. female presenting with syncope. The history is provided by the patient, a relative and medical records.  Loss of Consciousness Episode history:  Single Most recent episode:  Today Duration:  10 seconds Timing:  Constant Progression:  Resolved Chronicity:  New Context comment:  Leaning to the right to get a pen out of a drawer Witnessed: yes   Relieved by:  Nothing Worsened by:  Nothing tried Ineffective treatments:  None tried Associated symptoms: shortness of breath   Associated symptoms: no chest pain, no dizziness, no fever, no headaches, no nausea, no palpitations and no vomiting   Risk factors comment:  Aortic stenosis, heart failure  80 yo F With a significant past medical history of aortic stenosis and heart failure with a chief complaint of syncope. Patient was leaning over to grab a pen and then doesn't remember anything else. Had a witnessed fall from a chair family so there was no head injury. Lasted for about 15 seconds. Patient was then back to the baseline. Patient has been treated for what sounds like worsening heart failure has been in and out of rehabilitation facilities. Patient denied any chest pain or shortness of breath prior to the event.  Past Medical History  Diagnosis Date  . Anemia   . Anxiety states   . Depressive disorder, not elsewhere classified   . Type II or unspecified type diabetes mellitus without mention of complication, not stated as uncontrolled   . Hyperlipidemia   . Unspecified essential hypertension   . Renal insufficiency   . Osteoarthritis   . Gout   . Iritis   . Nonischemic cardiomyopathy (HCC)     EF 20 to 25% per echo 06/2011  .  Chronic anticoagulation   . NSTEMI (non-ST elevated myocardial infarction) [facility]) Feb 2013    06/2011 cath  Mild nonobstructive disease. No LV gram  . PAF (paroxysmal atrial fibrillation) ([place])   . Obesity   . Cancer [facility]) 2010    Right breast  . DVT (deep venous thrombosis) ([place]) 2010  . Stroke [facility]) 2004    Left upper lobe  . Thyroid disease 1969    Three fourth of Thyroid removed  . Complication of anesthesia     "fights it", for colonoscopy  . Flash pulmonary edema ([place])     06/2011  . HOH (hard of hearing)     deaf in L completely  . OSA on CPAP     CPAP at night  . GERD (gastroesophageal reflux disease)   . Hypothyroidism   . Chronic kidney disease (CKD), stage III (moderate)   . Peripheral vascular disease ([place]) Jan. 2015    Left leg pain and swelling   Past Surgical History  Procedure Laterality Date  . Breast lumpectomy    . Cataract extraction    . Foot surgery    . Tonsillectomy  1949  . Abdominal hysterectomy  1974  . Eye surgery  2008    Glaucoma shunt Right eye  . Parotid gland tumor excision  2000  . Cystectomy  1974    Left Breast, chin, left of groin area  . Thyroidectomy, partial  1969  . Endarterectomy  11/18/2011    Procedure: ENDARTERECTOMY  CAROTID;  Surgeon: Conrad South Dennis, MD;  Location: [place];  Service: Vascular;  Laterality: Left;  [name] Mastectomy  right  . Mastectomy    . Cardiac catheterization      no stent  . Colonoscopy w/ polypectomy    . Endarterectomy  01/12/2012    Procedure: ENDARTERECTOMY CAROTID;  Surgeon: Conrad New Roads, MD;  Location: [facility] OR;  Service: Vascular;  Laterality: Right;  Right carotid endarterectomy with 1cm x 6cm vascuguard patch angioplasty.  . Carotid endarterectomy Left 11-18-11    cea  . Carotid endarterectomy Right 01-12-12    cea  . Left and right heart catheterization with coronary/graft angiogram Left 07/20/2011    Procedure: LEFT AND RIGHT HEART CATHETERIZATION WITH Beatrix Fetters;  Surgeon: Peter M Martinique,  MD;  Location: [facility] CATH LAB;  Service: Cardiovascular;  Laterality: Left;   Family History  Problem Relation Age of Onset  . Hypertension    . Congestive Heart Failure Mother 55  . Breast cancer Mother   . Heart attack Mother   . Heart disease Father 87  . Heart disease Brother   . Coronary artery disease Son 40    2 Stents  . Heart attack Father   . AAA (abdominal aortic aneurysm) Brother    Social History  Substance Use Topics  . Smoking status: Former Smoker -- 2.00 packs/day for 40 years    Types: Cigarettes    Quit date: 05/25/2002  . Smokeless tobacco: Never Used  . Alcohol Use: No   OB History    No data available     Review of Systems  Constitutional: Negative for fever and chills.  HENT: Negative for congestion and rhinorrhea.   Eyes: Negative for redness and visual disturbance.  Respiratory: Positive for shortness of breath. Negative for wheezing.   Cardiovascular: Positive for leg swelling and syncope. Negative for chest pain and palpitations.  Gastrointestinal: Negative for nausea and vomiting.  Genitourinary: Negative for dysuria and urgency.  Musculoskeletal: Negative for myalgias and arthralgias.  Skin: Negative for pallor and wound.  Neurological: Positive for syncope. Negative for dizziness and headaches.      Allergies  Amlodipine; Atorvastatin; Colesevelam; Coumadin; Lasix; Statins; and Tape  Home Medications   Prior to Admission medications   Medication Sig Start Date End Date Taking? Authorizing Provider  acetaminophen (TYLENOL) 500 MG tablet Take 500 mg by mouth daily as needed for mild pain, moderate pain or headache.    Yes Historical Provider, MD  albuterol (PROVENTIL HFA;VENTOLIN HFA) 108 (90 BASE) MCG/ACT inhaler Inhale 2 puffs into the lungs every 6 (six) hours as needed for wheezing or shortness of breath. Patient taking differently: Inhale 2 puffs into the lungs at bedtime.  09/15/12  Yes Deneise Lever, MD  allopurinol (ZYLOPRIM) 100  MG tablet Take 100 mg by mouth three times weekly. 12/13/12  Yes Aleksei Plotnikov V, MD  brimonidine (ALPHAGAN) 0.15 % ophthalmic solution Place 1 drop into both eyes daily.    Yes Historical Provider, MD  buPROPion (WELLBUTRIN SR) 150 MG 12 hr tablet Take 150 mg by mouth daily.  05/22/14  Yes Historical Provider, MD  carvedilol (COREG) 12.5 MG tablet Take 12.5-25 mg by mouth 2 (two) times daily with a meal. Take 2 tabs by mouth in the am & 1 tab by mouth in the pm   Yes Historical Provider, MD  cholecalciferol (VITAMIN D) 1000 units tablet Take 1,000 Units by mouth 2 (two) times daily.   Yes Historical Provider, MD  clorazepate (TRANXENE) 7.5 MG tablet Take 1 tablet (7.5 mg total) by mouth 2 (two) times daily as needed for anxiety. 01/21/15  Yes Aleksei Plotnikov V, MD  Fluticasone-Salmeterol (ADVAIR DISKUS) 100-50 MCG/DOSE AEPB Inhale 1 puff into the lungs every 12 (twelve) hours. Discontinue all previous refills for this medication. Hold/Fill when appropriate according to pt rx fill schedule. 01/21/15  Yes Aleksei Plotnikov V, MD  gabapentin (NEURONTIN) 100 MG capsule 2 caps twice daily Patient taking differently: Take 200 mg by mouth 2 (two) times daily. 2 caps twice daily 07/16/15  Yes Aleksei Plotnikov V, MD  hydrALAZINE (APRESOLINE) 25 MG tablet TAKE 1 TABLET (25 MG TOTAL) BY MOUTH 3 (THREE) TIMES DAILY. Patient taking differently: Take 25 mg by mouth 3 (three) times daily. TAKE 1 TABLET (25 MG TOTAL) BY MOUTH 3 (THREE) TIMES DAILY. 04/26/15  Yes Josue Hector, MD  isosorbide mononitrate (IMDUR) 60 MG 24 hr tablet TAKE 1 TABLET (60 MG TOTAL) BY MOUTH DAILY. 05/21/15  Yes Josue Hector, MD  levothyroxine (SYNTHROID, LEVOTHROID) 137 MCG tablet TAKE 1 TABLET BY MOUTH DAILY 03/12/15  Yes Aleksei Plotnikov V, MD  prednisoLONE acetate (PRED FORTE) 1 % ophthalmic suspension Place 1 drop into both eyes as needed (eye inflammation). For eye pain   Yes Historical Provider, MD  predniSONE (DELTASONE) 5 MG  tablet Take 1 tablet (5 mg total) by mouth daily with breakfast. Prednisone: take a total of 5 mg/d pc 04/12/15  Yes Aleksei Plotnikov V, MD  timolol (TIMOPTIC) 0.5 % ophthalmic solution Place 1 drop into both eyes 2 (two) times daily.    Yes Historical Provider, MD  torsemide (DEMADEX) 20 MG tablet Take 2 tablets (40 mg total) by mouth daily as needed (for edema). Patient taking differently: Take 20 mg by mouth daily as needed (for edema).  07/16/15  Yes Aleksei Plotnikov V, MD  traMADol (ULTRAM) 50 MG tablet TAKE 1-2 TABLETS BY MOUTH 2 TIMES DAILY AS NEEDED FOR MODERATE OR SEVERE PAIN 06/28/15  Yes Aleksei Plotnikov V, MD  warfarin (COUMADIN) 3 MG tablet TAKE AS DIRECTED BY ANTICOAGULATION CLINIC Patient taking differently: Take 3 mg by mouth daily at bedtime. 06/06/15  Yes Aleksei Plotnikov V, MD  metolazone (ZAROXOLYN) 2.5 MG tablet Take 1 tablet (2.5 mg total) by mouth daily. Patient not taking: Reported on 07/23/2015 06/27/15   Josue Hector, MD   BP 207/70 mmHg  Pulse 68  Resp 12  SpO2 94% Physical Exam  Constitutional: She is oriented to person, place, and time. She appears well-developed and well-nourished. No distress.  Marked pallor  HENT:  Head: Normocephalic and atraumatic.  Eyes: EOM are normal. Pupils are equal, round, and reactive to light.  Neck: Normal range of motion. Neck supple.  Cardiovascular: Normal rate and regular rhythm.  Exam reveals no gallop and no friction rub.   No murmur heard. Pulmonary/Chest: Effort normal. She has no wheezes. She has rales (in bilateral bases).  Abdominal: Soft. She exhibits no distension. There is no tenderness.  Musculoskeletal: She exhibits edema (4+ to the thighs). She exhibits no tenderness.  Neurological: She is alert and oriented to person, place, and time.  Skin: Skin is warm and dry. She is not diaphoretic.  Psychiatric: She has a normal mood and affect. Her behavior is normal.  Nursing note and vitals reviewed.   ED Course   Procedures (including critical care time) Labs Review Labs Reviewed  CBC WITH DIFFERENTIAL/PLATELET - Abnormal; Notable for the following:    RBC 3.84 (*)  Hemoglobin 8.3 (*)    HCT 29.7 (*)    MCV 77.3 (*)    MCH 21.6 (*)    MCHC 27.9 (*)    RDW 17.5 (*)    Lymphs Abs 0.5 (*)    All other components within normal limits  COMPREHENSIVE METABOLIC PANEL - Abnormal; Notable for the following:    Chloride 112 (*)    CO2 21 (*)    Glucose, Bld 128 (*)    BUN 30 (*)    Creatinine, Ser 2.16 (*)    Calcium 8.8 (*)    Total Protein 5.9 (*)    Albumin 3.1 (*)    ALT 11 (*)    GFR calc non Af Amer 21 (*)    GFR calc Af Amer 24 (*)    All other components within normal limits  BRAIN NATRIURETIC PEPTIDE - Abnormal; Notable for the following:    B Natriuretic Peptide 2008.1 (*)    All other components within normal limits  PROTIME-INR - Abnormal; Notable for the following:    Prothrombin Time 25.2 (*)    INR 2.31 (*)    All other components within normal limits  BASIC METABOLIC PANEL  CBC  I-STAT TROPOININ, ED    Imaging Review Dg Chest 2 View  07/23/2015  CLINICAL DATA:  Syncope today, pt fell on her floor while attempting to bend over and pick up something. Pt denies chest pain and SOB. Hx of HTN, DM, CA, renal insufficiency, NSTEMI, DVT, PAF, GERD, flash pulmonary edema, CKD. Ex-smoker, quit in 2004. EXAM: CHEST  2 VIEW COMPARISON:  Prior chest radiographs, most recent dated 07/22/2015 FINDINGS: Cardiac enlargement is stable. No mediastinal or hilar masses or evidence of adenopathy. Lungs are hyperexpanded and show thickened interstitial markings. There is linear opacity in the left upper lobe lingula consistent with scarring. No evidence of pneumonia or pulmonary edema. No pleural effusion or pneumothorax. Bony thorax is demineralized but grossly intact. No significant change from most recent prior study. IMPRESSION: 1. No acute cardiopulmonary disease and no significant change from  the previous day's study. Electronically Signed   By: Lajean Manes M.D.   On: 07/23/2015 17:59   Dg Chest 2 View  07/22/2015  CLINICAL DATA:  80 year old female with shortness breath on exertion for the past 2 months. Hypertension. Previous smoker. EXAM: CHEST  2 VIEW COMPARISON:  Chest x-ray 05/21/2014. FINDINGS: Diffuse peribronchial cuffing. Lungs are hyperexpanded with flattening of the hemidiaphragms and increased retrosternal airspace. No acute consolidative airspace disease. Linear areas of scarring throughout the left mid to lower lung, similar to the prior study. No pleural effusions. No evidence of pulmonary edema. Heart size is mild transient heart size is moderately enlarged. Atherosclerosis in the thoracic aorta. IMPRESSION: 1. Diffuse peribronchial cuffing, suggestive of acute bronchitis. 2. Moderate cardiomegaly. 3. Atherosclerosis. Electronically Signed   By: Vinnie Langton M.D.   On: 07/22/2015 15:34   I have personally reviewed and evaluated these images and lab results as part of my medical decision-making.   EKG Interpretation   Date/Time:  Tuesday July 23 2015 16:54:25 EST Ventricular Rate:  59 PR Interval:  219 QRS Duration: 104 QT Interval:  447 QTC Calculation: 443 R Axis:   -15 Text Interpretation:  Sinus rhythm Borderline prolonged PR interval No  significant change since last tracing Confirmed by Brees Hounshell MD, Quillian Quince  [id number]) on 07/23/2015 5:52:19 PM      MDM   Final diagnoses:  Syncope and collapse    80 yo  F with a chief complaint of syncope. Patient is high risk with a history of heart failure in the past. Also has a history of aortic stenosis. Was not exertional in nature however the patient likely increased or intrathoracic pressure by bending to the side. Feel she needs be admitted for repeat echo and telemetry monitoring.  The patients results and plan were reviewed and discussed.   Any x-rays performed were independently reviewed by myself.    Differential diagnosis were considered with the presenting HPI.  Medications  gabapentin (NEURONTIN) capsule 200 mg (not administered)  torsemide (DEMADEX) tablet 20 mg (not administered)  warfarin (COUMADIN) tablet 3 mg (not administered)  carvedilol (COREG) tablet 12.5-25 mg (not administered)  levothyroxine (SYNTHROID, LEVOTHROID) tablet 137 mcg (not administered)  clorazepate (TRANXENE) tablet 7.5 mg (not administered)  allopurinol (ZYLOPRIM) tablet 100 mg (not administered)  prednisoLONE acetate (PRED FORTE) 1 % ophthalmic suspension 1 drop (not administered)  brimonidine (ALPHAGAN) 0.15 % ophthalmic solution 1 drop (not administered)  timolol (TIMOPTIC) 0.5 % ophthalmic solution 1 drop (not administered)  hydrALAZINE (APRESOLINE) tablet 25 mg (not administered)  isosorbide mononitrate (IMDUR) 24 hr tablet 60 mg (not administered)  cholecalciferol (VITAMIN D) tablet 1,000 Units (not administered)  0.9 %  sodium chloride infusion (not administered)  acetaminophen (TYLENOL) tablet 650 mg (not administered)    Or  acetaminophen (TYLENOL) suppository 650 mg (not administered)  oxyCODONE (Oxy IR/ROXICODONE) immediate release tablet 5 mg (not administered)  HYDROmorphone (DILAUDID) injection 0.5-1 mg (not administered)  ondansetron (ZOFRAN) tablet 4 mg (not administered)    Or  ondansetron (ZOFRAN) injection 4 mg (not administered)  alum & mag hydroxide-simeth (MAALOX/MYLANTA) 200-200-20 MG/5ML suspension 30 mL (not administered)  sodium chloride flush (NS) 0.9 % injection 3 mL (not administered)    Filed Vitals:   07/23/15 2008 07/23/15 2030 07/23/15 2100 07/23/15 2130  BP: 109/75 143/80 202/68 207/70  Pulse: 72 66 67 68  Resp: 20 13 13 12   SpO2: 94% 96% 96% 94%    Final diagnoses:  Syncope and collapse    Admission/ observation were discussed with the admitting physician, patient and/or family and they are comfortable with the plan.    Deno Etienne, DO 07/23/15 2312

## 2015-07-23 NOTE — ED Notes (Signed)
Pt arrived via EMS c/o witnessed syncopal episode at home with family.  Pt denies any injuries, family reports 15-30 seconds LOC.  Right arm restricted from mastectomy.  Pt reports warfarin at home.

## 2015-07-23 NOTE — Assessment & Plan Note (Signed)
She states she is comfortable with pressure 10 and husband states that she is faithful with it, using it every night

## 2015-07-24 ENCOUNTER — Observation Stay (HOSPITAL_COMMUNITY): Payer: Medicare Other

## 2015-07-24 ENCOUNTER — Encounter (HOSPITAL_COMMUNITY): Payer: Self-pay | Admitting: Cardiology

## 2015-07-24 DIAGNOSIS — I5023 Acute on chronic systolic (congestive) heart failure: Secondary | ICD-10-CM | POA: Diagnosis not present

## 2015-07-24 DIAGNOSIS — I10 Essential (primary) hypertension: Secondary | ICD-10-CM | POA: Diagnosis not present

## 2015-07-24 DIAGNOSIS — I5022 Chronic systolic (congestive) heart failure: Secondary | ICD-10-CM

## 2015-07-24 DIAGNOSIS — R55 Syncope and collapse: Secondary | ICD-10-CM | POA: Diagnosis not present

## 2015-07-24 DIAGNOSIS — N183 Chronic kidney disease, stage 3 (moderate): Secondary | ICD-10-CM | POA: Diagnosis not present

## 2015-07-24 LAB — CBC
HCT: 29.8 % — ABNORMAL LOW (ref 36.0–46.0)
HEMOGLOBIN: 8.4 g/dL — AB (ref 12.0–15.0)
MCH: 21.6 pg — AB (ref 26.0–34.0)
MCHC: 28.2 g/dL — ABNORMAL LOW (ref 30.0–36.0)
MCV: 76.8 fL — AB (ref 78.0–100.0)
Platelets: 294 10*3/uL (ref 150–400)
RBC: 3.88 MIL/uL (ref 3.87–5.11)
RDW: 17.5 % — ABNORMAL HIGH (ref 11.5–15.5)
WBC: 8 10*3/uL (ref 4.0–10.5)

## 2015-07-24 LAB — BASIC METABOLIC PANEL
ANION GAP: 8 (ref 5–15)
BUN: 29 mg/dL — ABNORMAL HIGH (ref 6–20)
CHLORIDE: 111 mmol/L (ref 101–111)
CO2: 19 mmol/L — AB (ref 22–32)
Calcium: 8.6 mg/dL — ABNORMAL LOW (ref 8.9–10.3)
Creatinine, Ser: 2.1 mg/dL — ABNORMAL HIGH (ref 0.44–1.00)
GFR calc non Af Amer: 22 mL/min — ABNORMAL LOW (ref >60)
GFR, EST AFRICAN AMERICAN: 25 mL/min — AB (ref >60)
Glucose, Bld: 95 mg/dL (ref 65–99)
POTASSIUM: 4.3 mmol/L (ref 3.5–5.1)
Sodium: 138 mmol/L (ref 135–145)

## 2015-07-24 LAB — PROTIME-INR
INR: 2.17 — ABNORMAL HIGH (ref 0.00–1.49)
Prothrombin Time: 24 seconds — ABNORMAL HIGH (ref 11.6–15.2)

## 2015-07-24 MED ORDER — TORSEMIDE 20 MG PO TABS
40.0000 mg | ORAL_TABLET | Freq: Two times a day (BID) | ORAL | Status: DC
  Administered 2015-07-24: 40 mg via ORAL
  Filled 2015-07-24: qty 2

## 2015-07-24 MED ORDER — WARFARIN SODIUM 3 MG PO TABS
3.0000 mg | ORAL_TABLET | Freq: Every day | ORAL | Status: DC
  Administered 2015-07-24 – 2015-07-26 (×3): 3 mg via ORAL
  Filled 2015-07-24 (×4): qty 1

## 2015-07-24 MED ORDER — HYDRALAZINE HCL 20 MG/ML IJ SOLN
5.0000 mg | Freq: Four times a day (QID) | INTRAMUSCULAR | Status: DC | PRN
  Administered 2015-07-24 – 2015-07-25 (×2): 5 mg via INTRAVENOUS
  Filled 2015-07-24 (×2): qty 1

## 2015-07-24 MED ORDER — TORSEMIDE 20 MG/2ML IV SOLN
40.0000 mg | Freq: Two times a day (BID) | INTRAVENOUS | Status: DC
  Filled 2015-07-24 (×2): qty 4

## 2015-07-24 MED ORDER — HYDRALAZINE HCL 50 MG PO TABS
50.0000 mg | ORAL_TABLET | Freq: Three times a day (TID) | ORAL | Status: DC
  Administered 2015-07-24 – 2015-07-27 (×8): 50 mg via ORAL
  Filled 2015-07-24 (×9): qty 1

## 2015-07-24 MED ORDER — BUPROPION HCL ER (SR) 150 MG PO TB12
150.0000 mg | ORAL_TABLET | Freq: Every day | ORAL | Status: DC
  Administered 2015-07-24 – 2015-07-27 (×4): 150 mg via ORAL
  Filled 2015-07-24 (×7): qty 1

## 2015-07-24 NOTE — Progress Notes (Signed)
[facility] PROGRESS NOTE  Wendy Cole [id number] DOB: 1936-01-11 DOA: 07/23/2015 PCP: Walker Kehr, MD  Assessment/Plan: #1 syncope Questionable etiology. Consult for cardiogenic syncope. Patient is not orthostatic. Patient did have some complaints of some palpitations as well as a feeling that her heart may have slowed down versus stopped for a few seconds. Patient also noted to be volume overloaded on examination. Patient with no focal neurological deficits. CT head negative. Check carotid Dopplers. Patient states had a recent 2-D echo done a cardiologist office however cannot find it on Epic. Will hold off on 2-D echo for now and see whether we can get records from cardiologist's office. If negative MRI head. Patient on telemetry. Consult with cardiology for further evaluation and management.  #2 acute on chronic systolic heart failure Patient noted to be on volume overload on examination with 3-4+ bilateral lower extremity edema, positive JVD is some bibasilar crackles. Patient also noted to be visibly somewhat short of breath. Patient states she is allergic to Lasix and is refusing any IV Lasix. Will place patient on double home dose Demadex. Patient status had recent echo done at cardiologist office and as such will not repeat a 2-D echo. Will need to get records from cardiologist office in terms of 2-D echo. Continue hydralazine, Imdur, Coreg. Strict I's and O's. Daily weights. Consult with cardiology for further evaluation and management.  #3 hypertension Continue hydralazine, Imdur, Coreg. Will start Demadex.  #4 hypothyroidism Check a TSH. Continue Synthroid.  #5 paroxysmal atrial fibrillation Continue Coreg for rate control. Coumadin for anticoagulation.  #6 chronic kidney disease stage III Stable. Monitor with diuresis.  #7 type 2 diabetes Sliding scale insulin.  #8 obstructive sleep apnea CPAP daily at bedtime.  #9 hyperlipidemia Patient intolerant to  statins. Check a fasting lipid panel.  #10 prophylaxis Coumadin for DVT prophylaxis.  Code Status: Full Family Communication: Updated patient, husband, and son at bedside. Disposition Plan: Home once acute CHF has resolved and syncopal workup is complete.   Consultants:  Cardiology pending  Procedures:  CT at 07/24/2015  Chest x-ray 07/23/2015  Antibiotics:  None  HPI/Subjective: Patient with some shortness of breath visibly. Patient denies any chest pain. No further syncopal episodes. Patient does state that has had some palpitations.  Objective: Filed Vitals:   07/24/15 1436 07/24/15 1437  BP: 157/120 179/53  Pulse: 53 51  Temp:    Resp:     No intake or output data in the 24 hours ending 07/24/15 1620 Filed Weights   07/23/15 2320  Weight: 98.385 kg (216 lb 14.4 oz)    Exam:   General:  NAD  Cardiovascular: Irregularly irregular. Positive JVD  Respiratory: Bibasilar crackles.  Abdomen: Obese, soft, nontender to palpation, positive bowel sounds.  Musculoskeletal: No clubbing or cyanosis. 3-4+ bilateral lower extremity edema.  Data Reviewed: Basic Metabolic Panel:  Recent Labs Lab 07/18/15 1503 07/23/15 1912 07/24/15 0725  NA 137 141 138  K 4.4 4.7 4.3  CL 106 112* 111  CO2 19* 21* 19*  GLUCOSE 97 128* 95  BUN 32* 30* 29*  CREATININE 2.12* 2.16* 2.10*  CALCIUM 8.5* 8.8* 8.6*   Liver Function Tests:  Recent Labs Lab 07/23/15 1912  AST 17  ALT 11*  ALKPHOS 63  BILITOT 0.6  PROT 5.9*  ALBUMIN 3.1*   No results for input(s): LIPASE, AMYLASE in the last 168 hours. No results for input(s): AMMONIA in the last 168 hours. CBC:  Recent Labs Lab 07/23/15 1912 07/24/15 0725  WBC 8.5 8.0  NEUTROABS 7.5  --   HGB 8.3* 8.4*  HCT 29.7* 29.8*  MCV 77.3* 76.8*  PLT 298 294   Cardiac Enzymes: No results for input(s): CKTOTAL, CKMB, CKMBINDEX, TROPONINI in the last 168 hours. BNP (last 3 results)  Recent Labs  07/23/15 1912  BNP  2008.1*    ProBNP (last 3 results)  Recent Labs  08/21/14 1318 07/22/15 1503  PROBNP 1437.0* >5000.0*    CBG: No results for input(s): GLUCAP in the last 168 hours.  No results found for this or any previous visit (from the past 240 hour(s)).   Studies: Dg Chest 2 View  07/23/2015  CLINICAL DATA:  Syncope today, pt fell on her floor while attempting to bend over and pick up something. Pt denies chest pain and SOB. Hx of HTN, DM, CA, renal insufficiency, NSTEMI, DVT, PAF, GERD, flash pulmonary edema, CKD. Ex-smoker, quit in 2004. EXAM: CHEST  2 VIEW COMPARISON:  Prior chest radiographs, most recent dated 07/22/2015 FINDINGS: Cardiac enlargement is stable. No mediastinal or hilar masses or evidence of adenopathy. Lungs are hyperexpanded and show thickened interstitial markings. There is linear opacity in the left upper lobe lingula consistent with scarring. No evidence of pneumonia or pulmonary edema. No pleural effusion or pneumothorax. Bony thorax is demineralized but grossly intact. No significant change from most recent prior study. IMPRESSION: 1. No acute cardiopulmonary disease and no significant change from the previous day's study. Electronically Signed   By: Lajean Manes M.D.   On: 07/23/2015 17:59   Ct Head Wo Contrast  07/24/2015  CLINICAL DATA:  Syncope this morning, severe headache today. EXAM: CT HEAD WITHOUT CONTRAST TECHNIQUE: Contiguous axial images were obtained from the base of the skull through the vertex without intravenous contrast. COMPARISON:  None. FINDINGS: There is atrophy and chronic small vessel disease changes. No acute intracranial abnormality. Specifically, no hemorrhage, hydrocephalus, mass lesion, acute infarction, or significant intracranial injury. No acute calvarial abnormality. Visualized paranasal sinuses and mastoids clear. Orbital soft tissues unremarkable. IMPRESSION: No acute intracranial abnormality. Atrophy, chronic microvascular disease.  Electronically Signed   By: Rolm Baptise M.D.   On: 07/24/2015 10:48    Scheduled Meds: . allopurinol  100 mg Oral TID  . brimonidine  1 drop Both Eyes Daily  . buPROPion  150 mg Oral Daily  . carvedilol  12.5 mg Oral Q supper  . carvedilol  25 mg Oral Q breakfast  . cholecalciferol  1,000 Units Oral BID  . gabapentin  200 mg Oral BID  . hydrALAZINE  25 mg Oral TID  . isosorbide mononitrate  60 mg Oral Daily  . levothyroxine  137 mcg Oral QAC breakfast  . sodium chloride flush  3 mL Intravenous Q12H  . timolol  1 drop Both Eyes BID  . warfarin  3 mg Oral q1800  . Warfarin - Physician Dosing Inpatient   Does not apply q1800   Continuous Infusions: . sodium chloride 50 mL/hr at 07/24/15 0037    Principal Problem:   Syncope Active Problems:   Diabetes type 2, controlled (HCC)   Dyslipidemia   Essential hypertension   OSA on CPAP   CKD (chronic kidney disease) stage 3, GFR 30-59 ml/min   PAF (paroxysmal atrial fibrillation) (HCC)   Chronic systolic CHF (congestive heart failure), NYHA class 3- EF 25-30%   Chronic anticoagulation    Time spent: 18 minutes    THOMPSON,DANIEL M.D. [facility] Pager [phone]. If 7PM-7AM, please contact night-coverage at [email or web address], password  [email or web address] 07/24/2015, 4:20 PM

## 2015-07-24 NOTE — Progress Notes (Signed)
RN called Pharmacist to inform him that tablet from of Demadex was given at 1710. RN saw that Demadex switched to IV form and was due 1830. RN was told by pharmacist to hold the 1830 dose and give the next scheduled dose. Dorita Fray  07/24/2015 6:31 PM

## 2015-07-24 NOTE — Consult Note (Signed)
CARDIOLOGY CONSULT NOTE  [name] MRN: [id number] DOB/AGE: [birth year] 80 y.o.  Admit date: 07/23/2015 Primary Physician Walker Kehr, MD Primary Cardiologist Dr. Johnsie Cancel Chief Complaint  Syncope  HPI:   The patient presented with a complaint of passing out at home.    We are called because of volume overload.  She has a complicated medical history as below.  She has multiple medical problems as recorded.  She did have a previous history of cardiomyopathy on cath in 2013 with an EF of 25%.   However, in 2014 her EF was 55% and last echo in 2016 had EF of 60%.   She has a history of PAF.  She last saw Dr. Johnsie Cancel 06/24/15 and did not seem to have any acute problems.  She gets around slowly in her house per her son.  He reports she has chronic dyspnea with exertion, edema and weakness.  She had an unwitnessed event yesterday in her kitchen.  She bent over to get something out of a cabinet and she ended up on the floor.  She did injure her left arm.  She said that the next thing she new her son and husband were coming into the kitchen.  She is not sure if she had LOC but thinks "I was out for 30 minutes."  She has some mild orthostatic symptoms and some occasional palpitations but she does not have syncope.  She did have at least one fall in the past.  Head CT showed no acute abnormalities.     She does have an elevated BNP.  CXR shows no acute abnormalities.  She is not reporting any acute SOB.  She has chronic edema.  She does report weighing herself.   She doesn't watch her salt intake ("I won't pour salt on my salad but I do use some.")  She does not take extra diuretic.  The patient denies any new symptoms such as chest discomfort, neck or arm discomfort. There has been no new shortness of breath, PND or orthopnea. There have been no reported palpitations, presyncope or syncope.  Past Medical History  Diagnosis Date  . Anemia   . Anxiety states   . Depressive disorder,  not elsewhere classified   . Type II or unspecified type diabetes mellitus without mention of complication, not stated as uncontrolled   . Hyperlipidemia   . Unspecified essential hypertension   . Renal insufficiency   . Osteoarthritis   . Gout   . Iritis   . Nonischemic cardiomyopathy (HCC)     EF 20 to 25% per echo 06/2011  . Chronic anticoagulation   . NSTEMI (non-ST elevated myocardial infarction) [facility]) Feb 2013    06/2011 cath  Mild nonobstructive disease. No LV gram  . PAF (paroxysmal atrial fibrillation) ([place])   . Obesity   . Cancer [facility]) 2010    Right breast  . DVT (deep venous thrombosis) ([place]) 2010  . Stroke [facility] 2004    Left upper lobe  . Thyroid disease 1969    Three fourth of Thyroid removed  . Complication of anesthesia     "fights it", for colonoscopy  . Flash pulmonary edema ([place])     06/2011  . HOH (hard of hearing)     deaf in L completely  . OSA on CPAP     CPAP at night  . GERD (gastroesophageal reflux disease)   . Hypothyroidism   . Chronic kidney disease (CKD), stage III (moderate)   .  Peripheral vascular disease ([place]) Jan. 2015    Left leg pain and swelling    Past Surgical History  Procedure Laterality Date  . Breast lumpectomy    . Cataract extraction    . Foot surgery    . Tonsillectomy  1949  . Abdominal hysterectomy  1974  . Eye surgery  2008    Glaucoma shunt Right eye  . Parotid gland tumor excision  2000  . Cystectomy  1974    Left Breast, chin, left of groin area  . Thyroidectomy, partial  1969  . Endarterectomy  11/18/2011    Procedure: ENDARTERECTOMY CAROTID;  Surgeon: Conrad Belle Rose, MD;  Location: [facility];  Service: Vascular;  Laterality: Left;  [name] Mastectomy  right  . Mastectomy    . Cardiac catheterization      no stent  . Colonoscopy w/ polypectomy    . Endarterectomy  01/12/2012    Procedure: ENDARTERECTOMY CAROTID;  Surgeon: Conrad Plevna, MD;  Location: [facility];  Service: Vascular;  Laterality: Right;  Right carotid endarterectomy  with 1cm x 6cm vascuguard patch angioplasty.  . Carotid endarterectomy Left 11-18-11    cea  . Carotid endarterectomy Right 01-12-12    cea  . Left and right heart catheterization with coronary/graft angiogram Left 07/20/2011    Procedure: LEFT AND RIGHT HEART CATHETERIZATION WITH Beatrix Fetters;  Surgeon: Peter M Martinique, MD;  Location: [facility] CATH LAB;  Service: Cardiovascular;  Laterality: Left;    Allergies  Allergen Reactions  . Amlodipine     edema  . Atorvastatin Other (See Comments)    Muscle aches  . Colesevelam Other (See Comments)    unknown  . Coumadin [Warfarin Sodium]     "causes cramps" brand name only per pt  . Lasix [Furosemide] Other (See Comments)    Muscle cramps  . Statins Other (See Comments)    Muscle aches  . Tape Rash   Prescriptions prior to admission  Medication Sig Dispense Refill Last Dose  . acetaminophen (TYLENOL) 500 MG tablet Take 500 mg by mouth daily as needed for mild pain, moderate pain or headache.    unknown  . albuterol (PROVENTIL HFA;VENTOLIN HFA) 108 (90 BASE) MCG/ACT inhaler Inhale 2 puffs into the lungs every 6 (six) hours as needed for wheezing or shortness of breath. (Patient taking differently: Inhale 2 puffs into the lungs at bedtime. ) 1 Inhaler 6 07/22/2015 at Unknown time  . allopurinol (ZYLOPRIM) 100 MG tablet Take 100 mg by mouth three times weekly.   Past Week at Unknown time  . brimonidine (ALPHAGAN) 0.15 % ophthalmic solution Place 1 drop into both eyes daily.    07/23/2015 at Unknown time  . buPROPion (WELLBUTRIN SR) 150 MG 12 hr tablet Take 150 mg by mouth daily.   3 07/23/2015 at Unknown time  . carvedilol (COREG) 12.5 MG tablet Take 12.5-25 mg by mouth 2 (two) times daily with a meal. Take 2 tabs by mouth in the am & 1 tab by mouth in the pm   07/23/2015 at 800  . cholecalciferol (VITAMIN D) 1000 units tablet Take 1,000 Units by mouth 2 (two) times daily.   07/23/2015 at Unknown time  . clorazepate (TRANXENE) 7.5 MG tablet Take  1 tablet (7.5 mg total) by mouth 2 (two) times daily as needed for anxiety. 60 tablet 5 over 30 days  . Fluticasone-Salmeterol (ADVAIR DISKUS) 100-50 MCG/DOSE AEPB Inhale 1 puff into the lungs every 12 (twelve) hours. Discontinue all previous refills for this  medication. Hold/Fill when appropriate according to pt rx fill schedule. 180 each 3 07/23/2015 at Unknown time  . gabapentin (NEURONTIN) 100 MG capsule 2 caps twice daily (Patient taking differently: Take 200 mg by mouth 2 (two) times daily. 2 caps twice daily) 120 capsule 5 07/23/2015 at Unknown time  . hydrALAZINE (APRESOLINE) 25 MG tablet TAKE 1 TABLET (25 MG TOTAL) BY MOUTH 3 (THREE) TIMES DAILY. (Patient taking differently: Take 25 mg by mouth 3 (three) times daily. TAKE 1 TABLET (25 MG TOTAL) BY MOUTH 3 (THREE) TIMES DAILY.) 270 tablet 3 07/23/2015 at Unknown time  . isosorbide mononitrate (IMDUR) 60 MG 24 hr tablet TAKE 1 TABLET (60 MG TOTAL) BY MOUTH DAILY. 30 tablet 5 07/23/2015 at Unknown time  . levothyroxine (SYNTHROID, LEVOTHROID) 137 MCG tablet TAKE 1 TABLET BY MOUTH DAILY 30 tablet 5 07/23/2015 at Unknown time  . prednisoLONE acetate (PRED FORTE) 1 % ophthalmic suspension Place 1 drop into both eyes as needed (eye inflammation). For eye pain   2 months  . predniSONE (DELTASONE) 5 MG tablet Take 1 tablet (5 mg total) by mouth daily with breakfast. Prednisone: take a total of 5 mg/d pc 90 tablet 3 07/23/2015 at Unknown time  . timolol (TIMOPTIC) 0.5 % ophthalmic solution Place 1 drop into both eyes 2 (two) times daily.    07/23/2015 at Unknown time  . torsemide (DEMADEX) 20 MG tablet Take 2 tablets (40 mg total) by mouth daily as needed (for edema). (Patient taking differently: Take 20 mg by mouth daily as needed (for edema). ) 60 tablet 5 07/23/2015 at Unknown time  . traMADol (ULTRAM) 50 MG tablet TAKE 1-2 TABLETS BY MOUTH 2 TIMES DAILY AS NEEDED FOR MODERATE OR SEVERE PAIN 100 tablet 3 over 30 days  . warfarin (COUMADIN) 3 MG tablet TAKE AS  DIRECTED BY ANTICOAGULATION CLINIC (Patient taking differently: Take 3 mg by mouth daily at bedtime.) 30 tablet 3 07/22/2015 at 1800  . metolazone (ZAROXOLYN) 2.5 MG tablet Take 1 tablet (2.5 mg total) by mouth daily. (Patient not taking: Reported on 07/23/2015) 30 tablet 11 Not Taking at Unknown time   Family History  Problem Relation Age of Onset  . Hypertension    . Congestive Heart Failure Mother 3  . Breast cancer Mother   . Heart attack Mother   . Heart disease Father 26  . Heart disease Brother   . Coronary artery disease Son 40    2 Stents  . Heart attack Father   . AAA (abdominal aortic aneurysm) Brother     Social History   Social History  . Marital Status: Married    Spouse Name: N/A  . Number of Children: 3  . Years of Education: N/A   Occupational History  . Retired    Social History Main Topics  . Smoking status: Former Smoker -- 2.00 packs/day for 40 years    Types: Cigarettes    Quit date: 05/25/2002  . Smokeless tobacco: Never Used  . Alcohol Use: No  . Drug Use: No  . Sexual Activity: Not Currently   Other Topics Concern  . Not on file   Social History Narrative   Lives with husband and youngest son.       ROS:    As stated in the HPI and negative for all other systems.  Physical Exam: Blood pressure 179/53, pulse 51, temperature 97.5 F (36.4 C), temperature source Oral, resp. rate 18, height 5\' 6"  (1.676 m), weight 216 lb 14.4 oz (98.385  kg), SpO2 96 %.  GENERAL:   NAD but she does appear to be chronically ill HEENT:  Pupils equal round and reactive, fundi not visualized, oral mucosa unremarkable NECK:  No jugular venous distention, waveform within normal limits, carotid upstroke brisk and symmetric, no bruits, no thyromegaly LYMPHATICS:  No cervical, inguinal adenopathy LUNGS:  Clear to auscultation bilaterally BACK:  No CVA tenderness CHEST:  Unremarkable HEART:  PMI not displaced or sustained,S1 and S2 within normal limits, no S3, no S4, no  clicks, no rubs, no murmurs ABD:  Flat, positive bowel sounds normal in frequency in pitch, no bruits, no rebound, no guarding, no midline pulsatile mass, no hepatomegaly, no splenomegaly EXT:  2 plus pulses throughout, moderate edema, no cyanosis no clubbing SKIN:  No rashes no nodules NEURO:  Cranial nerves II through XII grossly intact, motor grossly intact throughout PSYCH:  Cognitively intact, oriented to person place and time   Labs: Lab Results  Component Value Date   BUN 29* 07/24/2015   Lab Results  Component Value Date   CREATININE 2.10* 07/24/2015   Lab Results  Component Value Date   NA 138 07/24/2015   K 4.3 07/24/2015   CL 111 07/24/2015   CO2 19* 07/24/2015    Lab Results  Component Value Date   WBC 8.0 07/24/2015   HGB 8.4* 07/24/2015   HCT 29.8* 07/24/2015   MCV 76.8* 07/24/2015   PLT 294 07/24/2015    Lab Results  Component Value Date   ALT 11* 07/23/2015   AST 17 07/23/2015   ALKPHOS 63 07/23/2015   BILITOT 0.6 07/23/2015     Radiology:   CXR:  Cardiac enlargement is stable. No mediastinal or hilar masses or evidence of adenopathy.  Lungs are hyperexpanded and show thickened interstitial markings. There is linear opacity in the left upper lobe lingula consistent with scarring. No evidence of pneumonia or pulmonary edema. No pleural effusion or pneumothorax.  Bony thorax is demineralized but grossly intact.  No significant change from most recent prior study.  EKG:   NSR, rate 59, poor anterior R wave progression, no acute ST T wave changes.  07/24/2015   ASSESSMENT AND PLAN:   ACUTE ON CHRONIC DIASTOLIC HF:  The BNP is elevated.   She does overall seem to have excess volume.  I will give IV Torsemide.  (She reports that she cannot take Lasix.)   SYNCOPE:  I am not clear whether this was syncope or a mechanical issue.  Doubt that further work up will be helpful since there are on presentation no objective findings and the details  surrounding the event were not clear.  Follow on telemetry for now.     PAF:  On warfarin with therapeutic INR.  Continue.   (She does report a coumadin allergy but tolerates warfarin.)  CKD:  Baseline creat seems to be at baseline.    Need to follow closely.    DM:  Per primary team.    HTN:  I will increase the hydralazine.    OSA:    Continue CPAP  ANEMIA:    Hgb was normal one year ago and falling over the past couple of months.  She has had a history anemia seen by Heme/Onc in the past.    Plan per primary team.  No historical evidence of bleeding.    SignedMinus Breeding 07/24/2015, 5:12 PM

## 2015-07-24 NOTE — Progress Notes (Signed)
RN text paged MD with pt's current B/P of 179/53. Waiting for MD instructions at this time.  Dorita Fray 07/24/2015 2:41 PM

## 2015-07-24 NOTE — Care Management Obs Status (Addendum)
[place] NOTIFICATION   Patient Details  Name: DAIELLE POOLE MRN: [id number] Date of Birth: 20-Aug-1935   Medicare Observation Status Notification Given:  Yes    Sharin Mons, RN 07/24/2015, 2:01 PM

## 2015-07-25 ENCOUNTER — Observation Stay (HOSPITAL_COMMUNITY): Payer: Medicare Other

## 2015-07-25 ENCOUNTER — Encounter (HOSPITAL_COMMUNITY): Payer: Medicare Other

## 2015-07-25 DIAGNOSIS — I5033 Acute on chronic diastolic (congestive) heart failure: Secondary | ICD-10-CM | POA: Diagnosis not present

## 2015-07-25 DIAGNOSIS — R55 Syncope and collapse: Secondary | ICD-10-CM

## 2015-07-25 LAB — CBC
HCT: 32.2 % — ABNORMAL LOW (ref 36.0–46.0)
HEMOGLOBIN: 9.3 g/dL — AB (ref 12.0–15.0)
MCH: 22.5 pg — AB (ref 26.0–34.0)
MCHC: 28.9 g/dL — AB (ref 30.0–36.0)
MCV: 77.8 fL — ABNORMAL LOW (ref 78.0–100.0)
PLATELETS: 296 10*3/uL (ref 150–400)
RBC: 4.14 MIL/uL (ref 3.87–5.11)
RDW: 17.7 % — AB (ref 11.5–15.5)
WBC: 9.5 10*3/uL (ref 4.0–10.5)

## 2015-07-25 LAB — BASIC METABOLIC PANEL
Anion gap: 13 (ref 5–15)
BUN: 27 mg/dL — AB (ref 6–20)
CALCIUM: 9.3 mg/dL (ref 8.9–10.3)
CO2: 22 mmol/L (ref 22–32)
CREATININE: 2.12 mg/dL — AB (ref 0.44–1.00)
Chloride: 107 mmol/L (ref 101–111)
GFR calc Af Amer: 25 mL/min — ABNORMAL LOW (ref >60)
GFR, EST NON AFRICAN AMERICAN: 21 mL/min — AB (ref >60)
GLUCOSE: 93 mg/dL (ref 65–99)
Potassium: 4.3 mmol/L (ref 3.5–5.1)
Sodium: 142 mmol/L (ref 135–145)

## 2015-07-25 LAB — BLOOD GAS, ARTERIAL
ACID-BASE DEFICIT: 2.6 mmol/L — AB (ref 0.0–2.0)
Bicarbonate: 20.7 mEq/L (ref 20.0–24.0)
DELIVERY SYSTEMS: POSITIVE
Drawn by: 257081
INSPIRATORY PAP: 6
MODE: POSITIVE
O2 CONTENT: 2 L/min
O2 SAT: 90.8 %
PCO2 ART: 29.6 mmHg — AB (ref 35.0–45.0)
PO2 ART: 59.7 mmHg — AB (ref 80.0–100.0)
Patient temperature: 98.6
TCO2: 21.6 mmol/L (ref 0–100)
pH, Arterial: 7.459 — ABNORMAL HIGH (ref 7.350–7.450)

## 2015-07-25 LAB — MAGNESIUM: MAGNESIUM: 1.6 mg/dL — AB (ref 1.7–2.4)

## 2015-07-25 LAB — PROTIME-INR
INR: 1.98 — AB (ref 0.00–1.49)
PROTHROMBIN TIME: 22.4 s — AB (ref 11.6–15.2)

## 2015-07-25 MED ORDER — MOMETASONE FURO-FORMOTEROL FUM 100-5 MCG/ACT IN AERO
2.0000 | INHALATION_SPRAY | Freq: Two times a day (BID) | RESPIRATORY_TRACT | Status: DC
  Administered 2015-07-26 – 2015-07-27 (×3): 2 via RESPIRATORY_TRACT
  Filled 2015-07-25: qty 8.8

## 2015-07-25 MED ORDER — ALBUTEROL SULFATE (2.5 MG/3ML) 0.083% IN NEBU: 2.5000 mg | INHALATION_SOLUTION | Freq: Four times a day (QID) | RESPIRATORY_TRACT | Status: DC

## 2015-07-25 MED ORDER — ALBUTEROL SULFATE (2.5 MG/3ML) 0.083% IN NEBU
2.5000 mg | INHALATION_SOLUTION | Freq: Four times a day (QID) | RESPIRATORY_TRACT | Status: DC
Start: 2015-07-25 — End: 2015-07-26
  Administered 2015-07-26: 2.5 mg via RESPIRATORY_TRACT
  Filled 2015-07-25: qty 3

## 2015-07-25 MED ORDER — TORSEMIDE 20 MG PO TABS
40.0000 mg | ORAL_TABLET | Freq: Two times a day (BID) | ORAL | Status: DC
  Administered 2015-07-25 – 2015-07-26 (×3): 40 mg via ORAL
  Filled 2015-07-25 (×3): qty 2

## 2015-07-25 MED ORDER — PREDNISONE 5 MG PO TABS
5.0000 mg | ORAL_TABLET | Freq: Every day | ORAL | Status: DC
  Administered 2015-07-26 – 2015-07-27 (×2): 5 mg via ORAL
  Filled 2015-07-25 (×2): qty 1

## 2015-07-25 NOTE — Progress Notes (Signed)
RT placed patient on CPAP auto 64max and 34min. 3L O2 bleed in. Patient tolerating well. RT will continue to monitor as needed.

## 2015-07-25 NOTE — Progress Notes (Signed)
Patient Name: Wendy Cole Date of Encounter: 07/25/2015    Primary Cardiologist: Dr. Johnsie Cancel  Patient Profile: 80 year old female with pmh of PAF, DM, HLD, NICM (EF 25% by cath 2013, last echo 2016 shows EF 60%), OSA, and CKD stage III. She was admitted on 07/23/15 for syncope at home.   SUBJECTIVE: Says she hurts all over and has for the past 3-4 years, cannot specify a specific source of pain, it is generalized. Denies chest pain or tightness.  Endorses SOB at rest.   OBJECTIVE Filed Vitals:   07/25/15 0505 07/25/15 0505 07/25/15 0637 07/25/15 0834  BP: 193/49   153/41  Pulse: 53 61  72  Temp: 97.5 F (36.4 C)   98.8 F (37.1 C)  TempSrc: Oral   Oral  Resp: 20   20  Height:      Weight:   213 lb 11.2 oz (96.934 kg)   SpO2: 71% 91%  90%    Intake/Output Summary (Last 24 hours) at 07/25/15 0935 Last data filed at 07/25/15 [id number]  Gross per 24 hour  Intake    120 ml  Output   4150 ml  Net  -4030 ml   Filed Weights   07/23/15 2320 07/25/15 0637  Weight: 216 lb 14.4 oz (98.385 kg) 213 lb 11.2 oz (96.934 kg)    PHYSICAL EXAM General: Well developed, well nourished, obese female in no acute distress. Head: Normocephalic, atraumatic.  Neck: Supple without bruits,  5-6 cm JVD. Lungs:  Resp regular, labored.  Rales in bilateral bases.  Heart: RRR, S1, S2, no S3, S4, or murmur; no rub. Abdomen: Soft, non-tender, non-distended, BS + x 4.  Extremities: No clubbing, cyanosis.  +2 edema in BLE.  Neuro: Alert and oriented X 3. Moves all extremities spontaneously. Psych: Normal affect.  LABS: CBC: Recent Labs  07/23/15 1912 07/24/15 0725 07/25/15 0611  WBC 8.5 8.0 9.5  NEUTROABS 7.5  --   --   HGB 8.3* 8.4* 9.3*  HCT 29.7* 29.8* 32.2*  MCV 77.3* 76.8* 77.8*  PLT 298 294 296   INR: Recent Labs  07/25/15 0611  INR [id number]*   Basic Metabolic Panel: Recent Labs  07/24/15 0725 07/25/15 0611  NA 138 142  K 4.3 4.3  CL 111 107  CO2 19* 22  GLUCOSE 95 93    BUN 29* 27*  CREATININE 2.10* 2.12*  CALCIUM 8.6* 9.3  MG  --  1.6*   Liver Function Tests: Recent Labs  07/23/15 1912  AST 17  ALT 11*  ALKPHOS 63  BILITOT 0.6  PROT 5.9*  ALBUMIN 3.1*    Recent Labs  07/23/15 1932  TROPIPOC 0.03   BNP:  B NATRIURETIC PEPTIDE  Date/Time Value Ref Range Status  07/23/2015 07:12 PM 2008.1* 0.0 - 100.0 pg/mL Final   PRO B NATRIURETIC PEPTIDE (BNP)  Date/Time Value Ref Range Status  07/22/2015 03:03 PM >5000.0* 0.0 - 100.0 pg/mL Final  08/21/2014 01:18 PM 1437.0* 0.0 - 100.0 pg/mL Final   D-dimer: Recent Labs  07/22/15 1503  DDIMER 1.48*     ECG: NSR  Radiology/Studies: Dg Chest 2 View  07/23/2015  CLINICAL DATA:  Syncope today, pt fell on her floor while attempting to bend over and pick up something. Pt denies chest pain and SOB. Hx of HTN, DM, CA, renal insufficiency, NSTEMI, DVT, PAF, GERD, flash pulmonary edema, CKD. Ex-smoker, quit in 2004. EXAM: CHEST  2 VIEW COMPARISON:  Prior chest radiographs, most recent dated  07/22/2015 FINDINGS: Cardiac enlargement is stable. No mediastinal or hilar masses or evidence of adenopathy. Lungs are hyperexpanded and show thickened interstitial markings. There is linear opacity in the left upper lobe lingula consistent with scarring. No evidence of pneumonia or pulmonary edema. No pleural effusion or pneumothorax. Bony thorax is demineralized but grossly intact. No significant change from most recent prior study. IMPRESSION: 1. No acute cardiopulmonary disease and no significant change from the previous day's study. Electronically Signed   By: Lajean Manes M.D.   On: 07/23/2015 17:59   Ct Head Wo Contrast  07/24/2015  CLINICAL DATA:  Syncope this morning, severe headache today. EXAM: CT HEAD WITHOUT CONTRAST TECHNIQUE: Contiguous axial images were obtained from the base of the skull through the vertex without intravenous contrast. COMPARISON:  None. FINDINGS: There is atrophy and chronic small  vessel disease changes. No acute intracranial abnormality. Specifically, no hemorrhage, hydrocephalus, mass lesion, acute infarction, or significant intracranial injury. No acute calvarial abnormality. Visualized paranasal sinuses and mastoids clear. Orbital soft tissues unremarkable. IMPRESSION: No acute intracranial abnormality. Atrophy, chronic microvascular disease. Electronically Signed   By: Rolm Baptise M.D.   On: 07/24/2015 10:48     Current Medications:  . allopurinol  100 mg Oral TID  . brimonidine  1 drop Both Eyes Daily  . buPROPion  150 mg Oral Daily  . carvedilol  12.5 mg Oral Q supper  . carvedilol  25 mg Oral Q breakfast  . cholecalciferol  1,000 Units Oral BID  . gabapentin  200 mg Oral BID  . hydrALAZINE  50 mg Oral TID  . isosorbide mononitrate  60 mg Oral Daily  . levothyroxine  137 mcg Oral QAC breakfast  . sodium chloride flush  3 mL Intravenous Q12H  . timolol  1 drop Both Eyes BID  . torsemide  40 mg Intravenous BID  . warfarin  3 mg Oral q1800  . Warfarin - Physician Dosing Inpatient   Does not apply q1800      ASSESSMENT AND PLAN: Principal Problem:   Syncope Active Problems:   Diabetes type 2, controlled (HCC)   Dyslipidemia   Essential hypertension   OSA on CPAP   CKD (chronic kidney disease) stage 3, GFR 30-59 ml/min   PAF (paroxysmal atrial fibrillation) (HCC)   Chronic systolic CHF (congestive heart failure), NYHA class 3- EF 25-30%   Chronic anticoagulation   Systolic CHF, acute on chronic (HCC)   Syncope and collapse  1. Acute on chronic systolic CHF - IV torsemide ordered yesterday, it was not given.  Will be given BID today.  - On hydralazine, isosorbide, coreg.   - Daily weights. Weight down 3 pounds from yesterday  - Negative 3.8 L  2. PAF - On warfarin, INR therapeutic  3. CKD  - Will continue to monitor.   - Will get BMP in the am to evaluate potassium after IV diuresis.   Signed, Arbutus Leas , NP 9:35 AM 07/25/2015    History and all data above reviewed.  Patient examined.  I agree with the findings as above.  Good UO yesterday with PO Torsemide only.  I/O incomplete.  Weight is down.  She complains of hurting all over but cannot localize this.    The patient exam reveals COR:RRR  ,  Lungs: Clear  ,  Abd: Positive bowel sounds, no rebound no guarding, Ext Moderate edema slightly improved.   .  All available labs, radiology testing, previous records reviewed. Agree with documented assessment and  plan. Acute on chronic diastolic HF:  Change back to PO torsemide.  She seems to have had good diuresis with this dose last night.    HTN:  I increased her hydralazine last night.  BP better this AM.  Can make further changes as needed based on readings today.   Syncope:  Telemetry reviewed.  She still is not at all sure that she passed out.     Jeneen Rinks Barbarita Hutmacher  10:31 AM  07/25/2015

## 2015-07-25 NOTE — Progress Notes (Signed)
Patient is refusing CPAP for tonight, stated she tried it last night but it was not comfortable and she could not sleep. She did not wish to wear it again tonight. RT made patient aware that if she changed her mind to call and RT would place her on CPAP.

## 2015-07-25 NOTE — Progress Notes (Signed)
RN text paged MD to inform MD of pt's increased confusion and increase SOB in bed. RN placed pt on [facility] for comfort which improved pt's O2Sats from 90% to 96%. Dorita Fray 07/25/2015 5:10 PM

## 2015-07-25 NOTE — Progress Notes (Signed)
Report called to [address] [place] to [employer]. No questions or concerns voiced. RN and CNA tech packing Pt belongings and will escort pt to [place] 07/25/2015 6:18 PM

## 2015-07-25 NOTE — Care Management Note (Addendum)
Case Management Note  Patient Details  Name: Wendy Cole MRN: [id number] Date of Birth: Jan 24, 1936 Subjective/Objective:                 Admitted with syncope from home with husband. Hx of PAF, DM, HLD, NICM (echo 2016 shows EF 60%), OSA, and CKD stage III. PTA min.assist with ADL's. DME: cane,walker.PCP: Alesksei Plotnikov.    Action/Plan:  Diuresing,home once acute CHF has resolved and syncopal workup is complete(EEG). Expected Discharge Date:                  Expected Discharge Plan:  Home/Self Care  In-House Referral:     Discharge planning Services     Post Acute Care Choice:  Resumption of Svcs/PTA Provider, Durable Medical Equipment Choice offered to:     DME Arranged:  [employer] DME Agency:  [place]:    [facility] Agency:     Status of Service:  In process, will continue to follow  Medicare Important Message Given:    Date Medicare IM Given:    Medicare IM give by:    Date Additional Medicare IM Given:    Additional Medicare Important Message give by:     If discussed at [place] of Stay Meetings, dates discussed:    Additional Comments:  Sharin Mons, [place] [phone] 07/25/2015, 12:05 PM

## 2015-07-25 NOTE — Progress Notes (Signed)
[facility] PROGRESS NOTE  Wendy Cole [id number] DOB: October 29, 1935 DOA: 07/23/2015 PCP: Walker Kehr, MD  Assessment/Plan: #1 syncope None further. Workup unremarkable thus far  Confusion: RN reports more confused this afternoon. Exam nonfocal. Appears to be in moderate respiratory distress. Will check ABG to r/o CO2 narcosis. Check NH3, B12, folate.  Copd: resume advair. Schedule albuterol. Does appear to have an element of bronchospasm  #2 acute on chronic systolic heart failure Cardiology managing  #3 hypertension Continue hydralazine, Imdur, Coreg.   #4 hypothyroidism TSH pending  #5 paroxysmal atrial fibrillation Continue Coreg. Coumadin  #6 chronic kidney disease stage III Stable. Monitor   #7 type 2 diabetes Sliding scale insulin.  #8 obstructive sleep apnea CPAP daily at bedtime.  #9 hyperlipidemia Patient intolerant to statins. Check a fasting lipid panel.  #10 prophylaxis Coumadin for DVT prophylaxis.  Consult PT  Code Status: Full Family Communication: none available Disposition Plan: await PT eval, clinical stability  Consultants:  Cardiology pending  Procedures:  CT at 07/24/2015  Chest x-ray 07/23/2015  Antibiotics:  None  HPI/Subjective: Per RN, more confused this afternoon. When asked, admits to dyspnea  Objective: Filed Vitals:   07/25/15 0834 07/25/15 1503  BP: 153/41 151/35  Pulse: 72 64  Temp: 98.8 F (37.1 C)   Resp: 20 18    Intake/Output Summary (Last 24 hours) at 07/25/15 1756 Last data filed at 07/25/15 1605  Gross per 24 hour  Intake    340 ml  Output   5450 ml  Net  -5110 ml   Filed Weights   07/23/15 2320 07/25/15 0637  Weight: 98.385 kg (216 lb 14.4 oz) 96.934 kg (213 lb 11.2 oz)    Exam:   General:  Forgetful. Cooperative. Truncated sentences. Mild to mod respiratory distress  Cardiovascular: Irregularly irregular. Positive JVD  Respiratory: diminished throughout with abdominal  breathing and prolonged expiratory phase  Abdomen: Obese, soft, nontender to palpation, positive bowel sounds.  Musculoskeletal: 2+ pitting  Data Reviewed: Basic Metabolic Panel:  Recent Labs Lab 07/23/15 1912 07/24/15 0725 07/25/15 0611  NA 141 138 142  K 4.7 4.3 4.3  CL 112* 111 107  CO2 21* 19* 22  GLUCOSE 128* 95 93  BUN 30* 29* 27*  CREATININE 2.16* 2.10* 2.12*  CALCIUM 8.8* 8.6* 9.3  MG  --   --  1.6*   Liver Function Tests:  Recent Labs Lab 07/23/15 1912  AST 17  ALT 11*  ALKPHOS 63  BILITOT 0.6  PROT 5.9*  ALBUMIN 3.1*   No results for input(s): LIPASE, AMYLASE in the last 168 hours. No results for input(s): AMMONIA in the last 168 hours. CBC:  Recent Labs Lab 07/23/15 1912 07/24/15 0725 07/25/15 0611  WBC 8.5 8.0 9.5  NEUTROABS 7.5  --   --   HGB 8.3* 8.4* 9.3*  HCT 29.7* 29.8* 32.2*  MCV 77.3* 76.8* 77.8*  PLT 298 294 296   Cardiac Enzymes: No results for input(s): CKTOTAL, CKMB, CKMBINDEX, TROPONINI in the last 168 hours. BNP (last 3 results)  Recent Labs  07/23/15 1912  BNP 2008.1*    ProBNP (last 3 results)  Recent Labs  08/21/14 1318 07/22/15 1503  PROBNP 1437.0* >5000.0*    CBG: No results for input(s): GLUCAP in the last 168 hours.  No results found for this or any previous visit (from the past 240 hour(s)).   Studies: Ct Head Wo Contrast  07/24/2015  CLINICAL DATA:  Syncope this morning, severe headache today. EXAM: CT HEAD  WITHOUT CONTRAST TECHNIQUE: Contiguous axial images were obtained from the base of the skull through the vertex without intravenous contrast. COMPARISON:  None. FINDINGS: There is atrophy and chronic small vessel disease changes. No acute intracranial abnormality. Specifically, no hemorrhage, hydrocephalus, mass lesion, acute infarction, or significant intracranial injury. No acute calvarial abnormality. Visualized paranasal sinuses and mastoids clear. Orbital soft tissues unremarkable. IMPRESSION: No  acute intracranial abnormality. Atrophy, chronic microvascular disease. Electronically Signed   By: Rolm Baptise M.D.   On: 07/24/2015 10:48    Scheduled Meds: . albuterol  2.5 mg Nebulization QID  . allopurinol  100 mg Oral TID  . brimonidine  1 drop Both Eyes Daily  . buPROPion  150 mg Oral Daily  . carvedilol  12.5 mg Oral Q supper  . carvedilol  25 mg Oral Q breakfast  . cholecalciferol  1,000 Units Oral BID  . gabapentin  200 mg Oral BID  . hydrALAZINE  50 mg Oral TID  . isosorbide mononitrate  60 mg Oral Daily  . levothyroxine  137 mcg Oral QAC breakfast  . mometasone-formoterol  2 puff Inhalation BID  . [START ON 07/26/2015] predniSONE  5 mg Oral Q breakfast  . sodium chloride flush  3 mL Intravenous Q12H  . timolol  1 drop Both Eyes BID  . torsemide  40 mg Oral BID  . warfarin  3 mg Oral q1800  . Warfarin - Physician Dosing Inpatient   Does not apply q1800   Continuous Infusions:    Principal Problem:   Syncope Active Problems:   Diabetes type 2, controlled (HCC)   Dyslipidemia   Essential hypertension   OSA on CPAP   CKD (chronic kidney disease) stage 3, GFR 30-59 ml/min   PAF (paroxysmal atrial fibrillation) (HCC)   Chronic systolic CHF (congestive heart failure), NYHA class 3- EF 25-30%   Chronic anticoagulation   Systolic CHF, acute on chronic (HCC)   Syncope and collapse    Time spent: 25 minutes    Doree Barthel L M.D. [facility] [email or web address], password [facility] 07/25/2015, 5:56 PM

## 2015-07-25 NOTE — Progress Notes (Signed)
EEG Completed; Results Pending  

## 2015-07-26 DIAGNOSIS — R55 Syncope and collapse: Secondary | ICD-10-CM | POA: Diagnosis not present

## 2015-07-26 DIAGNOSIS — I5023 Acute on chronic systolic (congestive) heart failure: Secondary | ICD-10-CM

## 2015-07-26 DIAGNOSIS — I480 Paroxysmal atrial fibrillation: Secondary | ICD-10-CM | POA: Diagnosis not present

## 2015-07-26 LAB — BASIC METABOLIC PANEL
ANION GAP: 12 (ref 5–15)
BUN: 28 mg/dL — ABNORMAL HIGH (ref 6–20)
CO2: 23 mmol/L (ref 22–32)
Calcium: 8.6 mg/dL — ABNORMAL LOW (ref 8.9–10.3)
Chloride: 104 mmol/L (ref 101–111)
Creatinine, Ser: 2.21 mg/dL — ABNORMAL HIGH (ref 0.44–1.00)
GFR calc Af Amer: 24 mL/min — ABNORMAL LOW (ref >60)
GFR, EST NON AFRICAN AMERICAN: 20 mL/min — AB (ref >60)
GLUCOSE: 98 mg/dL (ref 65–99)
POTASSIUM: 3.6 mmol/L (ref 3.5–5.1)
Sodium: 139 mmol/L (ref 135–145)

## 2015-07-26 LAB — PROTIME-INR
INR: 2.49 — AB (ref 0.00–1.49)
Prothrombin Time: 26.6 seconds — ABNORMAL HIGH (ref 11.6–15.2)

## 2015-07-26 LAB — TSH: TSH: 2.953 u[IU]/mL (ref 0.350–4.500)

## 2015-07-26 LAB — AMMONIA: AMMONIA: 35 umol/L (ref 9–35)

## 2015-07-26 LAB — VITAMIN B12: VITAMIN B 12: 757 pg/mL (ref 180–914)

## 2015-07-26 MED ORDER — ALBUTEROL SULFATE (2.5 MG/3ML) 0.083% IN NEBU
2.5000 mg | INHALATION_SOLUTION | Freq: Two times a day (BID) | RESPIRATORY_TRACT | Status: DC
  Administered 2015-07-26 – 2015-07-27 (×2): 2.5 mg via RESPIRATORY_TRACT
  Filled 2015-07-26 (×2): qty 3

## 2015-07-26 MED ORDER — TORSEMIDE 20 MG PO TABS: 40.0000 mg | ORAL_TABLET | Freq: Every day | ORAL | Status: DC

## 2015-07-26 NOTE — Progress Notes (Signed)
[facility] PROGRESS NOTE  Wendy Cole [id number] DOB: 1935-06-11 DOA: 07/23/2015 PCP: Walker Kehr, MD  Assessment/Plan: #1 syncope None further. Workup unremarkable thus far  Confusion: ABG without hypercarbia. NH3 ok. Patient thinks it was the pain meds. Will d/c. Improved today  Copd: resume advair. Schedule albuterol. Breathing easier today  #2 acute on chronic systolic heart failure Cardiology managing  #3 hypertension Continue hydralazine, Imdur, Coreg.   #4 hypothyroidism TSH pending  #5 paroxysmal atrial fibrillation Continue Coreg. Coumadin  #6 chronic kidney disease stage III Stable. Monitor   #7 type 2 diabetes Sliding scale insulin.  #8 obstructive sleep apnea CPAP daily at bedtime.  #9 hyperlipidemia Patient intolerant to statins. Check a fasting lipid panel.  #10 prophylaxis Coumadin for DVT prophylaxis.  Consult PT  Code Status: Full Family Communication: none available Disposition Plan: SNF soon if pt agrees  Consultants:  Cardiology pending  Procedures:  CT at 07/24/2015  Chest x-ray 07/23/2015  Antibiotics:  None  HPI/Subjective: No dyspnea. Remembers me.   Objective: Filed Vitals:   07/26/15 0534 07/26/15 1403  BP: 155/35   Pulse: 66 63  Temp: 99.6 F (37.6 C) 97.9 F (36.6 C)  Resp: 18     Intake/Output Summary (Last 24 hours) at 07/26/15 1431 Last data filed at 07/26/15 1021  Gross per 24 hour  Intake    480 ml  Output   3100 ml  Net  -2620 ml   Filed Weights   07/23/15 2320 07/25/15 0637  Weight: 98.385 kg (216 lb 14.4 oz) 96.934 kg (213 lb 11.2 oz)    Exam:   General:  Brighter and less confused. Breathing less labored  Cardiovascular: Irregularly irregular.   Respiratory: diminished throughout.  Abdomen: Obese, soft, nontender to palpation, positive bowel sounds.  Musculoskeletal: trace pitting  Data Reviewed: Basic Metabolic Panel:  Recent Labs Lab 07/23/15 1912  07/24/15 0725 07/25/15 0611 07/26/15 0554  NA 141 138 142 139  K 4.7 4.3 4.3 3.6  CL 112* 111 107 104  CO2 21* 19* 22 23  GLUCOSE 128* 95 93 98  BUN 30* 29* 27* 28*  CREATININE 2.16* 2.10* 2.12* 2.21*  CALCIUM 8.8* 8.6* 9.3 8.6*  MG  --   --  1.6*  --    Liver Function Tests:  Recent Labs Lab 07/23/15 1912  AST 17  ALT 11*  ALKPHOS 63  BILITOT 0.6  PROT 5.9*  ALBUMIN 3.1*   No results for input(s): LIPASE, AMYLASE in the last 168 hours.  Recent Labs Lab 07/26/15 0554  AMMONIA 35   CBC:  Recent Labs Lab 07/23/15 1912 07/24/15 0725 07/25/15 0611  WBC 8.5 8.0 9.5  NEUTROABS 7.5  --   --   HGB 8.3* 8.4* 9.3*  HCT 29.7* 29.8* 32.2*  MCV 77.3* 76.8* 77.8*  PLT 298 294 296   Cardiac Enzymes: No results for input(s): CKTOTAL, CKMB, CKMBINDEX, TROPONINI in the last 168 hours. BNP (last 3 results)  Recent Labs  07/23/15 1912  BNP 2008.1*    ProBNP (last 3 results)  Recent Labs  08/21/14 1318 07/22/15 1503  PROBNP 1437.0* >5000.0*    CBG: No results for input(s): GLUCAP in the last 168 hours.  No results found for this or any previous visit (from the past 240 hour(s)).   Studies: No results found.  Scheduled Meds: . albuterol  2.5 mg Nebulization BID  . allopurinol  100 mg Oral TID  . brimonidine  1 drop Both Eyes Daily  . buPROPion  150 mg Oral Daily  . carvedilol  12.5 mg Oral Q supper  . carvedilol  25 mg Oral Q breakfast  . cholecalciferol  1,000 Units Oral BID  . gabapentin  200 mg Oral BID  . hydrALAZINE  50 mg Oral TID  . isosorbide mononitrate  60 mg Oral Daily  . levothyroxine  137 mcg Oral QAC breakfast  . mometasone-formoterol  2 puff Inhalation BID  . predniSONE  5 mg Oral Q breakfast  . sodium chloride flush  3 mL Intravenous Q12H  . timolol  1 drop Both Eyes BID  . torsemide  40 mg Oral BID  . warfarin  3 mg Oral q1800  . Warfarin - Physician Dosing Inpatient   Does not apply q1800   Continuous Infusions:    Principal  Problem:   Syncope Active Problems:   Diabetes type 2, controlled (HCC)   Dyslipidemia   Essential hypertension   OSA on CPAP   CKD (chronic kidney disease) stage 3, GFR 30-59 ml/min   PAF (paroxysmal atrial fibrillation) (HCC)   Chronic systolic CHF (congestive heart failure), NYHA class 3- EF 25-30%   Chronic anticoagulation   Systolic CHF, acute on chronic (HCC)   Syncope and collapse    Time spent: 25 minutes    Doree Barthel L M.D. [facility] [email or web address], password [facility] 07/26/2015, 2:31 PM

## 2015-07-26 NOTE — Progress Notes (Signed)
Patient Name: Wendy Cole Date of Encounter: 07/26/2015  Principal Problem:   Syncope Active Problems:   Diabetes type 2, controlled ([place])   Dyslipidemia   Essential hypertension   OSA on CPAP   CKD (chronic kidney disease) stage 3, GFR 30-59 ml/min   PAF (paroxysmal atrial fibrillation) (HCC)   Chronic systolic CHF (congestive heart failure), NYHA class 3- EF 25-30%   Chronic anticoagulation   Systolic CHF, acute on chronic (HCC)   Syncope and collapse   Primary Cardiologist: Dr. Johnsie Cancel Patient Profile: 80 year old female with pmh of PAF, DM, HLD, NICM (EF 25% by cath 2013, last echo 2016 shows EF 60%), OSA, and CKD stage III. She was admitted on 07/23/15 for syncope at home. Cardiology was consulted to manage her chronic diastolic CHF.    SUBJECTIVE: Says she feels good today, still hurts all over. Denies chest pain, SOB.   OBJECTIVE Filed Vitals:   07/25/15 2123 07/26/15 0208 07/26/15 0534 07/26/15 0829  BP: 165/44 162/40 155/35   Pulse: 73 71 66   Temp: 98.4 F (36.9 C) 100.1 F (37.8 C) 99.6 F (37.6 C)   TempSrc: Oral Oral Oral   Resp: 20 20 18    Height:      Weight:      SpO2: 90% 92% 95% 87%    Intake/Output Summary (Last 24 hours) at 07/26/15 0934 Last data filed at 07/26/15 0412  Gross per 24 hour  Intake    360 ml  Output   3750 ml  Net  -3390 ml   Filed Weights   07/23/15 2320 07/25/15 0637  Weight: 216 lb 14.4 oz (98.385 kg) 213 lb 11.2 oz (96.934 kg)    PHYSICAL EXAM General: Well developed, well nourished, obese female in no acute distress. Head: Normocephalic, atraumatic.  Neck: Supple without bruits, no JVD. Lungs:  Resp regular and unlabored, CTA. Heart: RRR, S1, S2, no S3, S4, or murmur; no rub. Abdomen: Soft, non-tender, non-distended, BS + x 4.  Extremities: No clubbing, cyanosis, edema.  Neuro: Alert and oriented X 3. Moves all extremities spontaneously. Psych: Normal affect.  LABS: CBC: Recent Labs  07/23/15 1912  07/24/15 0725 07/25/15 0611  WBC 8.5 8.0 9.5  NEUTROABS 7.5  --   --   HGB 8.3* 8.4* 9.3*  HCT 29.7* 29.8* 32.2*  MCV 77.3* 76.8* 77.8*  PLT 298 294 296   INR: Recent Labs  07/26/15 0554  INR Q000111Q*   Basic Metabolic Panel: Recent Labs  07/25/15 0611 07/26/15 0554  NA 142 139  K 4.3 3.6  CL 107 104  CO2 22 23  GLUCOSE 93 98  BUN 27* 28*  CREATININE 2.12* 2.21*  CALCIUM 9.3 8.6*  MG 1.6*  --    Liver Function Tests: Recent Labs  07/23/15 1912  AST 17  ALT 11*  ALKPHOS 63  BILITOT 0.6  PROT 5.9*  ALBUMIN 3.1*    Recent Labs  07/23/15 1932  TROPIPOC 0.03   BNP:  B NATRIURETIC PEPTIDE  Date/Time Value Ref Range Status  07/23/2015 07:12 PM 2008.1* 0.0 - 100.0 pg/mL Final   PRO B NATRIURETIC PEPTIDE (BNP)  Date/Time Value Ref Range Status  07/22/2015 03:03 PM >5000.0* 0.0 - 100.0 pg/mL Final  08/21/2014 01:18 PM 1437.0* 0.0 - 100.0 pg/mL Final   Thyroid Function Tests: Recent Labs  07/26/15 0554  TSH 2.953   Anemia Panel: Recent Labs  07/26/15 0554  VITAMINB12 757    TELE: NSR  ECG:  NSR  Radiology/Studies: Ct Head Wo Contrast  07/24/2015  CLINICAL DATA:  Syncope this morning, severe headache today. EXAM: CT HEAD WITHOUT CONTRAST TECHNIQUE: Contiguous axial images were obtained from the base of the skull through the vertex without intravenous contrast. COMPARISON:  None. FINDINGS: There is atrophy and chronic small vessel disease changes. No acute intracranial abnormality. Specifically, no hemorrhage, hydrocephalus, mass lesion, acute infarction, or significant intracranial injury. No acute calvarial abnormality. Visualized paranasal sinuses and mastoids clear. Orbital soft tissues unremarkable. IMPRESSION: No acute intracranial abnormality. Atrophy, chronic microvascular disease. Electronically Signed   By: Rolm Baptise M.D.   On: 07/24/2015 10:48     Current Medications:  . albuterol  2.5 mg Nebulization QID  . allopurinol  100 mg Oral TID    . brimonidine  1 drop Both Eyes Daily  . buPROPion  150 mg Oral Daily  . carvedilol  12.5 mg Oral Q supper  . carvedilol  25 mg Oral Q breakfast  . cholecalciferol  1,000 Units Oral BID  . gabapentin  200 mg Oral BID  . hydrALAZINE  50 mg Oral TID  . isosorbide mononitrate  60 mg Oral Daily  . levothyroxine  137 mcg Oral QAC breakfast  . mometasone-formoterol  2 puff Inhalation BID  . predniSONE  5 mg Oral Q breakfast  . sodium chloride flush  3 mL Intravenous Q12H  . timolol  1 drop Both Eyes BID  . torsemide  40 mg Oral BID  . warfarin  3 mg Oral q1800  . Warfarin - Physician Dosing Inpatient   Does not apply q1800      ASSESSMENT AND PLAN: Principal Problem:   Syncope Active Problems:   Diabetes type 2, controlled (HCC)   Dyslipidemia   Essential hypertension   OSA on CPAP   CKD (chronic kidney disease) stage 3, GFR 30-59 ml/min   PAF (paroxysmal atrial fibrillation) (HCC)   Chronic systolic CHF (congestive heart failure), NYHA class 3- EF 25-30%   Chronic anticoagulation   Systolic CHF, acute on chronic (HCC)   Syncope and collapse  1. Acute on chronic diastolic CHF - weight not done today.  - No SOB at rest.  - Negative 3.3L, responds well to po torsemide.  - Cr stable (2.21), not on ACE/ARB - Can continue BID 40mg   Torsemide,given her abnormal renal function, may consider going back to her home dose of torsemide 20mg  BID, and she can use sliding scale diuretic dosing based on her weights at home. MD to determine.   2. HTN - Hydralazine increased on 07/24/15.  - SBP in Q000111Q, but diastolic pressures are low (40's), no change in meds reccommended.   3. PAF - On warfarin, INR is therapeutic.    Signed, Arbutus Leas , NP 9:34 AM 07/26/2015   History and all data above reviewed.  Patient examined.  I agree with the findings as above.  She continues to complain of diffuse muscle aching.    The patient exam reveals [id number]  ,  Lungs: Clear  ,  Abd: Positive  bowel sounds, no rebound no guarding, Ext decreased edema   .  All available labs, radiology testing, previous records reviewed. Agree with documented assessment and plan. Acute on chronic systolic HF.  She seems to be much less volume overloaded at this time.  I am going to reduce the Torsemide to 20 mg daily.  I had a discussion with her and her family about keeping legs elevated and salt restriction.  Her  husband reports that she has great confusion with her meds and might not take them as prescribed.  Also, she has increasing needs for help with activities of daily living including self care.  They would like to understand options for any home care.  I will defer to primary team.       Minus Breeding  2:41 PM  07/26/2015

## 2015-07-26 NOTE — Evaluation (Signed)
Physical Therapy Evaluation Patient Details Name: NEPHTALIE JAROS MRN: [id number] DOB: 21-Nov-1935 Today's Date: 07/26/2015   History of Present Illness  80 y.o. female with a history of CAD, Paroxysmal Atrial Fibrillation on Warfarin Rx, CHF, HTN, Stage III CKD, Hypothyroid, OSA and hx of Breast Cancer S/P Rt. Mastectomy who presents to the ED with complaints of passing out at home  Clinical Impression  Pt presents requiring mod/max A with mobility and is unable to stand this session. Pt will benefit from skilled PT to address deficits in strength and mobility and decrease burden of care for safe d/c.    Follow Up Recommendations SNF    Equipment Recommendations  Other (comment) (TBD)    Recommendations for Other Services OT consult     Precautions / Restrictions Precautions Precautions: Fall Restrictions Weight Bearing Restrictions: No      Mobility  Bed Mobility Overal bed mobility: Needs Assistance Bed Mobility: Supine to Sit;Sit to Supine     Supine to sit: Mod assist Sit to supine: Mod assist   General bed mobility comments: pt requires increasead time due to highly sensitive to light touch on LEs and feet, requires assist at trunk for supine to sit and asssit for B LEs for sit to supine  Transfers Overall transfer level: Needs assistance Equipment used: Rolling walker (2 wheeled) Transfers: Sit to/from Stand           General transfer comment: attempt sit to stand from bed and then from bed at elevated height, pt unable to help due to weakness in UEs and LEs, unable to stand despite max A from PT  Ambulation/Gait                Stairs            Wheelchair Mobility    Modified Rankin (Stroke Patients Only)       Balance Overall balance assessment:  (pt able to sit EOB x 5 minutes without assistance)                                           Pertinent Vitals/Pain Pain Assessment: Faces Faces Pain Scale: Hurts even  more Pain Location: LEs Pain Descriptors / Indicators: Grimacing Pain Intervention(s): Limited activity within patient's tolerance;Monitored during session;Repositioned    Home Living Family/patient expects to be discharged to:: Private residence Living Arrangements: Spouse/significant other;Children Available Help at Discharge: Family;Available 24 hours/day Type of Home: House Home Access: Stairs to enter Entrance Stairs-Rails: None Entrance Stairs-Number of Steps: 1 Home Layout: One level Home Equipment: Walker - 2 wheels;Cane - single point Additional Comments: pt's son reports she would hold onto him to walk when he was available    Prior Function Level of Independence: Independent with assistive device(s)               Hand Dominance        Extremity/Trunk Assessment   Upper Extremity Assessment: Generalized weakness           Lower Extremity Assessment: Generalized weakness      Cervical / Trunk Assessment: Kyphotic  Communication   Communication: No difficulties  Cognition Arousal/Alertness: Awake/alert Behavior During Therapy: WFL for tasks assessed/performed Overall Cognitive Status: Within Functional Limits for tasks assessed                      General Comments General comments (  skin integrity, edema, etc.): very sensitive to light touch B LEs    Exercises        Assessment/Plan    PT Assessment Patient needs continued PT services  PT Diagnosis Difficulty walking;Generalized weakness   PT Problem List Decreased strength;Decreased mobility;Decreased activity tolerance;Decreased balance;Impaired sensation;Pain;Decreased knowledge of use of DME  PT Treatment Interventions DME instruction;Gait training;Stair training;Functional mobility training;Therapeutic activities;Patient/family education;Neuromuscular re-education;Balance training;Wheelchair mobility training;Therapeutic exercise;Manual techniques;Modalities   PT Goals (Current  goals can be found in the Care Plan section) Acute Rehab PT Goals Patient Stated Goal: go home PT Goal Formulation: With patient/family Time For Goal Achievement: 08/09/15 Potential to Achieve Goals: Fair    Frequency Min 3X/week   Barriers to discharge Inaccessible home environment;Decreased caregiver support pt with 1 step to enter, pt's husband an above knee amputee who uses crutches, pt's son works and is not available for 24/7 assistance    Co-evaluation               End of Session Equipment Utilized During Treatment: Gait belt Activity Tolerance: Patient limited by pain Patient left: in bed;with call bell/phone within reach;with bed alarm set;with family/visitor present Nurse Communication: Mobility status    Functional Assessment Tool Used: clinical judgement Functional Limitation: Mobility: Walking and moving around Mobility: Walking and Moving Around Current Status [phone]): At least 60 percent but less than 80 percent impaired, limited or restricted Mobility: Walking and Moving Around Goal Status [phone]): At least 1 percent but less than 20 percent impaired, limited or restricted    Time: 1430-1455 PT Time Calculation (min) (ACUTE ONLY): 25 min   Charges:   PT Evaluation $PT Eval Moderate Complexity: 1 Procedure PT Treatments $Therapeutic Activity: 8-22 mins   PT G Codes:   PT G-Codes **NOT FOR INPATIENT CLASS** Functional Assessment Tool Used: clinical judgement Functional Limitation: Mobility: Walking and moving around Mobility: Walking and Moving Around Current Status [id number]): At least 60 percent but less than 80 percent impaired, limited or restricted Mobility: Walking and Moving Around Goal Status [phone]): At least 1 percent but less than 20 percent impaired, limited or restricted    Shelonda Saxe 07/26/2015, 3:01 PM

## 2015-07-27 DIAGNOSIS — I480 Paroxysmal atrial fibrillation: Secondary | ICD-10-CM

## 2015-07-27 DIAGNOSIS — I5023 Acute on chronic systolic (congestive) heart failure: Secondary | ICD-10-CM | POA: Diagnosis not present

## 2015-07-27 DIAGNOSIS — I5033 Acute on chronic diastolic (congestive) heart failure: Secondary | ICD-10-CM | POA: Diagnosis not present

## 2015-07-27 DIAGNOSIS — R55 Syncope and collapse: Secondary | ICD-10-CM | POA: Diagnosis not present

## 2015-07-27 LAB — HEMOGLOBIN AND HEMATOCRIT, BLOOD
HEMATOCRIT: 28 % — AB (ref 36.0–46.0)
HEMOGLOBIN: 7.9 g/dL — AB (ref 12.0–15.0)

## 2015-07-27 LAB — BASIC METABOLIC PANEL
ANION GAP: 13 (ref 5–15)
BUN: 34 mg/dL — ABNORMAL HIGH (ref 6–20)
CALCIUM: 8.2 mg/dL — AB (ref 8.9–10.3)
CO2: 23 mmol/L (ref 22–32)
CREATININE: 2.33 mg/dL — AB (ref 0.44–1.00)
Chloride: 102 mmol/L (ref 101–111)
GFR, EST AFRICAN AMERICAN: 22 mL/min — AB (ref >60)
GFR, EST NON AFRICAN AMERICAN: 19 mL/min — AB (ref >60)
Glucose, Bld: 92 mg/dL (ref 65–99)
Potassium: 3.6 mmol/L (ref 3.5–5.1)
SODIUM: 138 mmol/L (ref 135–145)

## 2015-07-27 LAB — PROTIME-INR
INR: 2.42 — AB (ref 0.00–1.49)
PROTHROMBIN TIME: 26.1 s — AB (ref 11.6–15.2)

## 2015-07-27 MED ORDER — TORSEMIDE 20 MG PO TABS
20.0000 mg | ORAL_TABLET | Freq: Every day | ORAL | Status: DC
  Administered 2015-07-27: 20 mg via ORAL
  Filled 2015-07-27: qty 1

## 2015-07-27 MED ORDER — HYDRALAZINE HCL 50 MG PO TABS: 50.0000 mg | ORAL_TABLET | Freq: Three times a day (TID) | ORAL | Status: DC

## 2015-07-27 MED ORDER — FERROUS SULFATE 325 (65 FE) MG PO TABS: 325.0000 mg | ORAL_TABLET | Freq: Every day | ORAL | Status: DC

## 2015-07-27 MED ORDER — TORSEMIDE 20 MG PO TABS: 20.0000 mg | ORAL_TABLET | Freq: Every day | ORAL | Status: DC

## 2015-07-27 NOTE — Discharge Summary (Signed)
Physician Discharge Summary  Wendy Cole [id number] DOB: 1936-04-30 DOA: 07/23/2015  PCP: Walker Kehr, MD  Admit date: 07/23/2015 Discharge date: 07/27/2015  Recommendations for Outpatient Follow-up:  1. SNF recommended. Patient declines. Will go home with home PT, RN   Discharge Diagnoses:  Principal Problem:   Syncope Active Problems:   Diabetes type 2, controlled ([place])   Dyslipidemia   Essential hypertension   OSA on CPAP   CKD (chronic kidney disease) stage 3, GFR 30-59 ml/min   PAF (paroxysmal atrial fibrillation) (HCC)   Chronic systolic CHF (congestive heart failure), NYHA class 3- EF 25-30%   Chronic anticoagulation   Systolic CHF, acute on chronic [facility])   Discharge Condition: stable  Diet recommendation: heart healthy, carbohydrate modified  Filed Weights   07/23/15 2320 07/25/15 0637 07/27/15 0509  Weight: 98.385 kg (216 lb 14.4 oz) 96.934 kg (213 lb 11.2 oz) 93.128 kg (205 lb 5 oz)    History of present illness:  80 y.o. female with a history of CAD, Paroxysmal Atrial Fibrillation on Warfarin Rx, CHF, HTN, Stage III CKD, Hypothyroid, OSA and hx of Breast Cancer S/P Rt. Mastectomy who presents to the ED with complaints of passing out at home. Her syncopal event was witnessed by family, and she reports that she was reaching and leaning over to get a sheet out of a cabinet and then blacked out. Her family reports that she was unconscious for 15 to 30 seconds. She denies having any prodrome. She denies having any chest pain.   Hospital Course:  syncope Workup unremarkable. Cardiology consulted in case cardiogenic syncope. Patient is not orthostatic. Patient did have some complaints of some palpitations as well as a feeling that her heart may have slowed down versus stopped for a few seconds. Patient also noted to be volume overloaded on examination. Patient with no focal neurological deficits. CT head negative. Check carotid Dopplers. Patient states  had a recent 2-D echo done a cardiologist office however cannot find it on Epic. If negative MRI head. Remained NSR on telemetry.   acute on chronic systolic heart failure Patient noted to be on volume overload on examination with 3-4+ bilateral lower extremity edema, positive JVD is some bibasilar crackles. Patient also noted to be visibly somewhat short of breath. Patient states she is allergic to Lasix and is refusing any IV Lasix. placed patient on double home dose Demadex. Patient status had recent echo done at cardiologist office and as such will not repeat a 2-D echo. Will need to get records from cardiologist office in terms of 2-D echo. Continue hydralazine, Imdur, Coreg. By discharge, euvolemic weight 205  Delirium: became confused with pain medications. NH3 and ABG ok. PT recommends SNF. Patient declines  hypothyroidism Check a TSH. Continue Synthroid.  COPD exacerbation: mild. Noted to wheezing and mild resporatory distress. Resolved with nebulizers.  paroxysmal atrial fibrillation Remained on coreg and coumadin  chronic kidney disease stage III remianed stable with diuresis   Procedures:  none  Consultations:  cardiology  Discharge Exam: Filed Vitals:   07/27/15 0509 07/27/15 0815  BP: 152/47   Pulse: 66 68  Temp: 99.1 F (37.3 C)   Resp: 18     General: a and o Cardiovascular: RRR Respiratory: CTA Ext no CCE  Discharge Instructions   Discharge Instructions    Diet - low sodium heart healthy    Complete by:  As directed      Walker     Complete by:  As directed  Current Discharge Medication List    START taking these medications   Details  ferrous sulfate 325 (65 FE) MG tablet Take 1 tablet (325 mg total) by mouth daily with breakfast. Qty: 30 tablet, Refills: 1      CONTINUE these medications which have CHANGED   Details  hydrALAZINE (APRESOLINE) 50 MG tablet Take 1 tablet (50 mg total) by mouth 3 (three) times daily. Qty: 90  tablet, Refills: 0    torsemide (DEMADEX) 20 MG tablet Take 1 tablet (20 mg total) by mouth daily. Qty: 60 tablet, Refills: 5      CONTINUE these medications which have NOT CHANGED   Details  acetaminophen (TYLENOL) 500 MG tablet Take 500 mg by mouth daily as needed for mild pain, moderate pain or headache.     albuterol (PROVENTIL HFA;VENTOLIN HFA) 108 (90 BASE) MCG/ACT inhaler Inhale 2 puffs into the lungs every 6 (six) hours as needed for wheezing or shortness of breath. Qty: 1 Inhaler, Refills: 6    allopurinol (ZYLOPRIM) 100 MG tablet Take 100 mg by mouth three times weekly.    brimonidine (ALPHAGAN) 0.15 % ophthalmic solution Place 1 drop into both eyes daily.     buPROPion (WELLBUTRIN SR) 150 MG 12 hr tablet Take 150 mg by mouth daily.  Refills: 3    carvedilol (COREG) 12.5 MG tablet Take 12.5-25 mg by mouth 2 (two) times daily with a meal. Take 2 tabs by mouth in the am & 1 tab by mouth in the pm    cholecalciferol (VITAMIN D) 1000 units tablet Take 1,000 Units by mouth 2 (two) times daily.    clorazepate (TRANXENE) 7.5 MG tablet Take 1 tablet (7.5 mg total) by mouth 2 (two) times daily as needed for anxiety. Qty: 60 tablet, Refills: 5    Fluticasone-Salmeterol (ADVAIR DISKUS) 100-50 MCG/DOSE AEPB Inhale 1 puff into the lungs every 12 (twelve) hours. Discontinue all previous refills for this medication. Hold/Fill when appropriate according to pt rx fill schedule. Qty: 180 each, Refills: 3   Associated Diagnoses: Acute respiratory failure with hypoxia (HCC)    gabapentin (NEURONTIN) 100 MG capsule 2 caps twice daily Qty: 120 capsule, Refills: 5    isosorbide mononitrate (IMDUR) 60 MG 24 hr tablet TAKE 1 TABLET (60 MG TOTAL) BY MOUTH DAILY. Qty: 30 tablet, Refills: 5    levothyroxine (SYNTHROID, LEVOTHROID) 137 MCG tablet TAKE 1 TABLET BY MOUTH DAILY Qty: 30 tablet, Refills: 5    prednisoLONE acetate (PRED FORTE) 1 % ophthalmic suspension Place 1 drop into both eyes as  needed (eye inflammation). For eye pain    predniSONE (DELTASONE) 5 MG tablet Take 1 tablet (5 mg total) by mouth daily with breakfast. Prednisone: take a total of 5 mg/d pc Qty: 90 tablet, Refills: 3    timolol (TIMOPTIC) 0.5 % ophthalmic solution Place 1 drop into both eyes 2 (two) times daily.     traMADol (ULTRAM) 50 MG tablet TAKE 1-2 TABLETS BY MOUTH 2 TIMES DAILY AS NEEDED FOR MODERATE OR SEVERE PAIN Qty: 100 tablet, Refills: 3    warfarin (COUMADIN) 3 MG tablet TAKE AS DIRECTED BY ANTICOAGULATION CLINIC Qty: 30 tablet, Refills: 3      STOP taking these medications     metolazone (ZAROXOLYN) 2.5 MG tablet        Allergies  Allergen Reactions  . Amlodipine     edema  . Atorvastatin Other (See Comments)    Muscle aches  . Colesevelam Other (See Comments)    unknown  .  Coumadin [Warfarin Sodium]     "causes cramps" brand name only per pt  . Lasix [Furosemide] Other (See Comments)    Muscle cramps  . Statins Other (See Comments)    Muscle aches  . Tape Rash   Follow-up Information    Follow up with Walker Kehr, MD In 1 week.   Specialty:  Internal Medicine   Contact information:   [place] [address] [phone]        The results of significant diagnostics from this hospitalization (including imaging, microbiology, ancillary and laboratory) are listed below for reference.    Significant Diagnostic Studies: Dg Chest 2 View  07/23/2015  CLINICAL DATA:  Syncope today, pt fell on her floor while attempting to bend over and pick up something. Pt denies chest pain and SOB. Hx of HTN, DM, CA, renal insufficiency, NSTEMI, DVT, PAF, GERD, flash pulmonary edema, CKD. Ex-smoker, quit in 2004. EXAM: CHEST  2 VIEW COMPARISON:  Prior chest radiographs, most recent dated 07/22/2015 FINDINGS: Cardiac enlargement is stable. No mediastinal or hilar masses or evidence of adenopathy. Lungs are hyperexpanded and show thickened interstitial markings. There is  linear opacity in the left upper lobe lingula consistent with scarring. No evidence of pneumonia or pulmonary edema. No pleural effusion or pneumothorax. Bony thorax is demineralized but grossly intact. No significant change from most recent prior study. IMPRESSION: 1. No acute cardiopulmonary disease and no significant change from the previous day's study. Electronically Signed   By: Lajean Manes M.D.   On: 07/23/2015 17:59   Dg Chest 2 View  07/22/2015  CLINICAL DATA:  80 year old female with shortness breath on exertion for the past 2 months. Hypertension. Previous smoker. EXAM: CHEST  2 VIEW COMPARISON:  Chest x-ray 05/21/2014. FINDINGS: Diffuse peribronchial cuffing. Lungs are hyperexpanded with flattening of the hemidiaphragms and increased retrosternal airspace. No acute consolidative airspace disease. Linear areas of scarring throughout the left mid to lower lung, similar to the prior study. No pleural effusions. No evidence of pulmonary edema. Heart size is mild transient heart size is moderately enlarged. Atherosclerosis in the thoracic aorta. IMPRESSION: 1. Diffuse peribronchial cuffing, suggestive of acute bronchitis. 2. Moderate cardiomegaly. 3. Atherosclerosis. Electronically Signed   By: Vinnie Langton M.D.   On: 07/22/2015 15:34   Ct Head Wo Contrast  07/24/2015  CLINICAL DATA:  Syncope this morning, severe headache today. EXAM: CT HEAD WITHOUT CONTRAST TECHNIQUE: Contiguous axial images were obtained from the base of the skull through the vertex without intravenous contrast. COMPARISON:  None. FINDINGS: There is atrophy and chronic small vessel disease changes. No acute intracranial abnormality. Specifically, no hemorrhage, hydrocephalus, mass lesion, acute infarction, or significant intracranial injury. No acute calvarial abnormality. Visualized paranasal sinuses and mastoids clear. Orbital soft tissues unremarkable. IMPRESSION: No acute intracranial abnormality. Atrophy, chronic  microvascular disease. Electronically Signed   By: Rolm Baptise M.D.   On: 07/24/2015 10:48   Mr Jodene Nam Head Wo Contrast  07/19/2015  CLINICAL DATA:  Headache.  Family history of aneurysm. EXAM: MRA HEAD WITHOUT CONTRAST TECHNIQUE: Angiographic images of the Circle of Willis were obtained using MRA technique without intravenous contrast. COMPARISON:  None. FINDINGS: The internal carotid arteries are widely patent. The basilar artery is widely patent. LEFT vertebral dominant. No proximal stenosis of the anterior, middle, or posterior cerebral arteries. No cerebellar branch occlusion. Less than 50% stenosis of the proximal RIGHT PCA. Greater than 50% stenosis proximal RIGHT SCA. Mild irregularity of the distal MCA and PCA branches suggesting  intracranial atherosclerotic change. No visible intracranial aneurysm. IMPRESSION: Mild intracranial atherosclerotic change. No proximal flow-limiting stenosis. No visible intracranial aneurysm. Electronically Signed   By: Staci Righter M.D.   On: 07/19/2015 15:26    Microbiology: No results found for this or any previous visit (from the past 240 hour(s)).   Labs: Basic Metabolic Panel:  Recent Labs Lab 07/23/15 1912 07/24/15 0725 07/25/15 0611 07/26/15 0554 07/27/15 0558  NA 141 138 142 139 138  K 4.7 4.3 4.3 3.6 3.6  CL 112* 111 107 104 102  CO2 21* 19* 22 23 23   GLUCOSE 128* 95 93 98 92  BUN 30* 29* 27* 28* 34*  CREATININE 2.16* 2.10* 2.12* 2.21* 2.33*  CALCIUM 8.8* 8.6* 9.3 8.6* 8.2*  MG  --   --  1.6*  --   --    Liver Function Tests:  Recent Labs Lab 07/23/15 1912  AST 17  ALT 11*  ALKPHOS 63  BILITOT 0.6  PROT 5.9*  ALBUMIN 3.1*   No results for input(s): LIPASE, AMYLASE in the last 168 hours.  Recent Labs Lab 07/26/15 0554  AMMONIA 35   CBC:  Recent Labs Lab 07/23/15 1912 07/24/15 0725 07/25/15 0611 07/27/15 0558  WBC 8.5 8.0 9.5  --   NEUTROABS 7.5  --   --   --   HGB 8.3* 8.4* 9.3* 7.9*  HCT 29.7* 29.8* 32.2* 28.0*   MCV 77.3* 76.8* 77.8*  --   PLT 298 294 296  --    Cardiac Enzymes: No results for input(s): CKTOTAL, CKMB, CKMBINDEX, TROPONINI in the last 168 hours. BNP: BNP (last 3 results)  Recent Labs  07/23/15 1912  BNP 2008.1*    ProBNP (last 3 results)  Recent Labs  08/21/14 1318 07/22/15 1503  PROBNP 1437.0* >5000.0*    CBG: No results for input(s): GLUCAP in the last 168 hours.     Signed:  Delfina Redwood MD.  [facility] 07/27/2015, 10:56 AM

## 2015-07-27 NOTE — Progress Notes (Signed)
Wendy Cole to be D/C'd Home per MD order. Discussed with the patient and all questions fully answered.    Medication List    STOP taking these medications        metolazone 2.5 MG tablet  Commonly known as:  ZAROXOLYN      TAKE these medications        acetaminophen 500 MG tablet  Commonly known as:  TYLENOL  Take 500 mg by mouth daily as needed for mild pain, moderate pain or headache.     albuterol 108 (90 Base) MCG/ACT inhaler  Commonly known as:  PROVENTIL HFA;VENTOLIN HFA  Inhale 2 puffs into the lungs every 6 (six) hours as needed for wheezing or shortness of breath.     allopurinol 100 MG tablet  Commonly known as:  ZYLOPRIM  Take 100 mg by mouth three times weekly.     brimonidine 0.15 % ophthalmic solution  Commonly known as:  ALPHAGAN  Place 1 drop into both eyes daily.     buPROPion 150 MG 12 hr tablet  Commonly known as:  WELLBUTRIN SR  Take 150 mg by mouth daily.     carvedilol 12.5 MG tablet  Commonly known as:  COREG  Take 12.5-25 mg by mouth 2 (two) times daily with a meal. Take 2 tabs by mouth in the am & 1 tab by mouth in the pm     cholecalciferol 1000 units tablet  Commonly known as:  VITAMIN D  Take 1,000 Units by mouth 2 (two) times daily.     clorazepate 7.5 MG tablet  Commonly known as:  TRANXENE  Take 1 tablet (7.5 mg total) by mouth 2 (two) times daily as needed for anxiety.     ferrous sulfate 325 (65 FE) MG tablet  Take 1 tablet (325 mg total) by mouth daily with breakfast.     Fluticasone-Salmeterol 100-50 MCG/DOSE Aepb  Commonly known as:  ADVAIR DISKUS  Inhale 1 puff into the lungs every 12 (twelve) hours. Discontinue all previous refills for this medication. Hold/Fill when appropriate according to pt rx fill schedule.     gabapentin 100 MG capsule  Commonly known as:  NEURONTIN  2 caps twice daily     hydrALAZINE 50 MG tablet  Commonly known as:  APRESOLINE  Take 1 tablet (50 mg total) by mouth 3 (three) times daily.     isosorbide mononitrate 60 MG 24 hr tablet  Commonly known as:  IMDUR  TAKE 1 TABLET (60 MG TOTAL) BY MOUTH DAILY.     levothyroxine 137 MCG tablet  Commonly known as:  SYNTHROID, LEVOTHROID  TAKE 1 TABLET BY MOUTH DAILY     prednisoLONE acetate 1 % ophthalmic suspension  Commonly known as:  PRED FORTE  Place 1 drop into both eyes as needed (eye inflammation). For eye pain     predniSONE 5 MG tablet  Commonly known as:  DELTASONE  Take 1 tablet (5 mg total) by mouth daily with breakfast. Prednisone: take a total of 5 mg/d pc     timolol 0.5 % ophthalmic solution  Commonly known as:  TIMOPTIC  Place 1 drop into both eyes 2 (two) times daily.     torsemide 20 MG tablet  Commonly known as:  DEMADEX  Take 1 tablet (20 mg total) by mouth daily.     traMADol 50 MG tablet  Commonly known as:  ULTRAM  TAKE 1-2 TABLETS BY MOUTH 2 TIMES DAILY AS NEEDED FOR MODERATE OR SEVERE PAIN  warfarin 3 MG tablet  Commonly known as:  COUMADIN  TAKE AS DIRECTED BY ANTICOAGULATION CLINIC        VVS, Skin clean, dry and intact without evidence of skin break down, no evidence of skin tears noted.  IV catheter discontinued intact. Site without signs and symptoms of complications. Dressing and pressure applied.  An After Visit Summary was printed and given to the patient.  Patient escorted via [place], and D/C home via private auto.  [name] F  07/27/2015 3:25 PM

## 2015-07-27 NOTE — Progress Notes (Signed)
Patient ID: SHALE MCGANN, female   DOB: 1936/01/04, 80 y.o.   MRN: [id number]     Subjective:    No SOB  Objective:   Temp:  [97.9 F (36.6 C)-99.1 F (37.3 C)] 99.1 F (37.3 C) (03/04 0509) Pulse Rate:  [63-68] 68 (03/04 0815) Resp:  [18-21] 18 (03/04 0509) BP: (108-152)/(37-48) 152/47 mmHg (03/04 0509) SpO2:  [83 %-93 %] 91 % (03/04 0509) Weight:  [205 lb 5 oz (93.128 kg)] 205 lb 5 oz (93.128 kg) (03/04 0509) Last BM Date: 08/09/15  Filed Weights   07/23/15 2320 07/25/15 0637 07/27/15 0509  Weight: 216 lb 14.4 oz (98.385 kg) 213 lb 11.2 oz (96.934 kg) 205 lb 5 oz (93.128 kg)    Intake/Output Summary (Last 24 hours) at 07/27/15 0840 Last data filed at 07/27/15 0756  Gross per 24 hour  Intake    420 ml  Output   2400 ml  Net  -1980 ml    Telemetry: NSR  Exam:  General: NAD  HEENT: sclera clear, throat clear  Resp: CTAB  Cardiac: RRR, 3/6 systolic murmur RUSB, no jvd  GI: abdomen soft, NT, ND  MSK: no LE edema  Neuro: no focal deficits  Psych: appropriate affect  Lab Results:  Basic Metabolic Panel:  Recent Labs Lab 07/24/15 0725 07/25/15 0611 07/26/15 0554  NA 138 142 139  K 4.3 4.3 3.6  CL 111 107 104  CO2 19* 22 23  GLUCOSE 95 93 98  BUN 29* 27* 28*  CREATININE 2.10* 2.12* 2.21*  CALCIUM 8.6* 9.3 8.6*  MG  --  1.6*  --     Liver Function Tests:  Recent Labs Lab 07/23/15 1912  AST 17  ALT 11*  ALKPHOS 63  BILITOT 0.6  PROT 5.9*  ALBUMIN 3.1*    CBC:  Recent Labs Lab 07/23/15 1912 07/24/15 0725 07/25/15 0611 07/27/15 0558  WBC 8.5 8.0 9.5  --   HGB 8.3* 8.4* 9.3* 7.9*  HCT 29.7* 29.8* 32.2* 28.0*  MCV 77.3* 76.8* 77.8*  --   PLT 298 294 296  --     Cardiac Enzymes: No results for input(s): CKTOTAL, CKMB, CKMBINDEX, TROPONINI in the last 168 hours.  BNP:  Recent Labs  08/21/14 1318 07/22/15 1503  PROBNP 1437.0* >5000.0*    Coagulation:  Recent Labs Lab 07/25/15 0611 07/26/15 0554 07/27/15 0558    INR 1.98* 2.49* 2.42*    ECG:   Medications:   Scheduled Medications: . albuterol  2.5 mg Nebulization BID  . allopurinol  100 mg Oral TID  . brimonidine  1 drop Both Eyes Daily  . buPROPion  150 mg Oral Daily  . carvedilol  12.5 mg Oral Q supper  . carvedilol  25 mg Oral Q breakfast  . cholecalciferol  1,000 Units Oral BID  . gabapentin  200 mg Oral BID  . hydrALAZINE  50 mg Oral TID  . isosorbide mononitrate  60 mg Oral Daily  . levothyroxine  137 mcg Oral QAC breakfast  . mometasone-formoterol  2 puff Inhalation BID  . predniSONE  5 mg Oral Q breakfast  . sodium chloride flush  3 mL Intravenous Q12H  . timolol  1 drop Both Eyes BID  . torsemide  40 mg Oral Daily  . warfarin  3 mg Oral q1800  . Warfarin - Physician Dosing Inpatient   Does not apply q1800     Infusions:     PRN Medications:  acetaminophen **OR** acetaminophen, alum & mag hydroxide-simeth,  clorazepate, hydrALAZINE, ondansetron **OR** ondansetron (ZOFRAN) IV, prednisoLONE acetate     Assessment/Plan    1. Acute on chronic diastolic HF - [id number] echo LVEF 55-60%, abnormal diastolc function, mod AS, mod AI - negative 1.7 liters yesterday, negative 9 liters since admission. Mild uptrend in Cr and BUN, baseline appears to be 1.8 to 2. Now on torsemide 40mg  daily, agree with Dr Cherlyn Cushing note form yesterday to decrease to 20mg  daily. From prior notes appears exacerbation more related to mixed compliance as opposed to diuretic failure.   2. PAF - rate control with coreg, coumadin for stroke prevention. INR is at goal.     Carlyle Dolly, M.D.

## 2015-07-27 NOTE — Procedures (Signed)
Pt does not wish to wear cpap will inform RT if anything changes. 

## 2015-07-27 NOTE — Care Management Note (Addendum)
Case Management Note  Patient Details  Name: Wendy Cole MRN: [id number] Date of Birth: [birth year]  Subjective/Objective: Syncope  Action/Plan: CM spoke with patient and she does not want to go to SNF due to cost and wanted to go home; offered choice for [facility] PT/RN/Aide. Patient said that she prefer CM speak to her husband about setting it up. Cm spoke to patient about DME and patient said that she has a wheelchair and a walker. Cm spoke to patient about 3N1 and she said it would be helpful but only if not expensive. Cm called Merry Proud with [facility] to request cost on 3N1 and he said that at most it would be $12. Patient was agreeable. Cm called Tiffany with [place] who said that there should be no copay but will call the patient if there is one. CM called and spoke to husband via phone to offer choice on [facility] services. CM advised about cost and he was agreeable. Husband was offered choice and he chose [facility]. CM called Tiffany with [facility] to advise of referral for PT/Aide/RN and referral accepted. Husband to be 24/7 caregiver and support person. Cm called Merry Proud with [facility] to deliver 3N1 and it will be delivered to bedside. No further Cm needs communicated; please refer back to CM should additional needs arise. Cm verified contact information.   Expected Discharge Date:  07/27/15               Expected Discharge Plan:  [place]  In-House Referral:   Discharge planning Services  CM Consult  Post Acute Care Choice:  Durable Medical Equipment Choice offered to:  Patient, Spouse  DME Arranged:  3-N-1 DME Agency:  [place]:  RN, PT, Nurse's Aide [place] Agency:  [place]  Status of Service:  Completed, signed off  Medicare Important Message Given:    Date Medicare IM Given:    Medicare IM give by:    Date Additional Medicare IM Given:    Additional Medicare Important Message give by:     If discussed at [place] of Stay Meetings, dates discussed:     Additional Comments:  Guido Sander, RN 07/27/2015, 11:45 AM

## 2015-07-29 LAB — FOLATE RBC
Folate, Hemolysate: 475.1 ng/mL
Folate, RBC: 1760 ng/mL (ref >498)
HEMATOCRIT: 27 % — AB (ref 34.0–46.6)

## 2015-07-29 NOTE — Progress Notes (Signed)
[name], female   DOB: August 21, 1935, 80 y.o.   MRN: [id number]   Wendy Cole is a 80 y.o. female with a history of heart failure NICM, PAF, OSA, HTN, prior DVT, renal insufficiency . She is post bilateral CEA;s She is normally seen by Dr Martinique and saw Dr Harrington Challenger in consultation at the hospital 11/18/11   for post op hypertension. She had a heart cath 07/13/11 One hospitalization 2013 for "flash pulmonary edema"    Echo 09/07/14  Study Conclusions  - Left ventricle: The cavity size was normal. Wall thickness was increased in a pattern of moderate LVH. Systolic function was normal. The estimated ejection fraction was in the range of 55% to 60%. Doppler parameters are consistent with both elevated ventricular end-diastolic filling pressure and elevated left atrial filling pressure. - Aortic valve: There was moderate stenosis. There was moderate regurgitation. Valve area (VTI): 1.05 cm^2. Valve area (Vmean): 0.99 cm^2. - Mitral valve: There was mild regurgitation. - Left atrium: The atrium was moderately dilated. - Atrial septum: No defect or patent foramen ovale was identified. - Pulmonary arteries: PA peak pressure: 60 mm Hg (S).   She is followed by Dr Penni Homans for anemia and breast cancer off arimidex Sees Dr Annamaria Boots for COPD OSA and previous smoker  Concerned about temporal arteritis  Family history of such and said ENT indicated diagnosis 2 years ago  Has headaches and blurry vision   Reviewed duplex 05/2015 previous CEA sights widely patent   Also increasing LE edema and dyspnea on chonic low dose prednisone for arthritis Last visit demedex increased BNP over 3000 and zaroxyln added   Seen in hospital 07/24/15 with fall vs syncope.  ? Volume overload but quickly resolved.  Hb low again  Lab Results  Component Value Date   HCT 28.0* 07/27/2015      ROS: Denies fever, malais, weight loss, blurry vision, decreased visual acuity, cough, sputum, SOB,  hemoptysis, pleuritic pain, palpitaitons, heartburn, abdominal pain, melena, lower extremity edema, claudication, or rash.  All other systems reviewed and negative  General: Affect appropriate Overweight white female  HEENT: normal Neck supple with no adenopathy post bilateral CEA;s  JVP normal left  bruits no thyromegaly Lungs clear with no wheezing and good diaphragmatic motion Heart:  S1/S2 AS  murmur, no rub, gallop or click PMI normal Abdomen: benighn, BS positve, no tenderness, no AAA no bruit.  No HSM or HJR Distal pulses intact with no bruits Plus 2  bilateral  edema Neuro non-focal Skin warm and dry No muscular weakness   Current Outpatient Prescriptions  Medication Sig Dispense Refill  . acetaminophen (TYLENOL) 500 MG tablet Take 500 mg by mouth daily as needed for mild pain, moderate pain or headache.     . albuterol (PROVENTIL HFA;VENTOLIN HFA) 108 (90 BASE) MCG/ACT inhaler Inhale 2 puffs into the lungs every 6 (six) hours as needed for wheezing or shortness of breath. (Patient taking differently: Inhale 2 puffs into the lungs at bedtime. ) 1 Inhaler 6  . allopurinol (ZYLOPRIM) 100 MG tablet Take 100 mg by mouth three times weekly.    . brimonidine (ALPHAGAN) 0.15 % ophthalmic solution Place 1 drop into both eyes daily.     [name] buPROPion (WELLBUTRIN SR) 150 MG 12 hr tablet Take 150 mg by mouth daily.   3  . carvedilol (COREG) 12.5 MG tablet Take 12.5-25 mg by mouth 2 (two) times daily with a meal. Take 2 tabs by mouth in the am &  1 tab by mouth in the pm    . cholecalciferol (VITAMIN D) 1000 units tablet Take 1,000 Units by mouth 2 (two) times daily.    . clorazepate (TRANXENE) 7.5 MG tablet Take 1 tablet (7.5 mg total) by mouth 2 (two) times daily as needed for anxiety. 60 tablet 5  . ferrous sulfate 325 (65 FE) MG tablet Take 1 tablet (325 mg total) by mouth daily with breakfast. 30 tablet 1  . Fluticasone-Salmeterol (ADVAIR DISKUS) 100-50 MCG/DOSE AEPB Inhale 1 puff into  the lungs every 12 (twelve) hours. Discontinue all previous refills for this medication. Hold/Fill when appropriate according to pt rx fill schedule. 180 each 3  . gabapentin (NEURONTIN) 100 MG capsule 2 caps twice daily (Patient taking differently: Take 200 mg by mouth 2 (two) times daily. 2 caps twice daily) 120 capsule 5  . hydrALAZINE (APRESOLINE) 50 MG tablet Take 1 tablet (50 mg total) by mouth 3 (three) times daily. 90 tablet 0  . isosorbide mononitrate (IMDUR) 60 MG 24 hr tablet TAKE 1 TABLET (60 MG TOTAL) BY MOUTH DAILY. 30 tablet 5  . levothyroxine (SYNTHROID, LEVOTHROID) 137 MCG tablet TAKE 1 TABLET BY MOUTH DAILY 30 tablet 5  . prednisoLONE acetate (PRED FORTE) 1 % ophthalmic suspension Place 1 drop into both eyes as needed (eye inflammation). For eye pain    . predniSONE (DELTASONE) 5 MG tablet Take 1 tablet (5 mg total) by mouth daily with breakfast. Prednisone: take a total of 5 mg/d pc 90 tablet 3  . timolol (TIMOPTIC) 0.5 % ophthalmic solution Place 1 drop into both eyes 2 (two) times daily.     [name] torsemide (DEMADEX) 20 MG tablet Take 1 tablet (20 mg total) by mouth daily. 60 tablet 5  . traMADol (ULTRAM) 50 MG tablet TAKE 1-2 TABLETS BY MOUTH 2 TIMES DAILY AS NEEDED FOR MODERATE OR SEVERE PAIN 100 tablet 3  . warfarin (COUMADIN) 3 MG tablet TAKE AS DIRECTED BY ANTICOAGULATION CLINIC (Patient taking differently: Take 3 mg by mouth daily at bedtime.) 30 tablet 3   No current facility-administered medications for this visit.    Allergies  Amlodipine; Atorvastatin; Colesevelam; Coumadin; Lasix; Statins; and Tape  Electrocardiogram: 2013  SR possible old IMI rate60  01/2014  SB rate 56 LVH LAD  04/26/15 SR rate 66 LAD no change   Assessment and Plan CAD:  Stable with no angina and good activity level.  Continue medical Rx AS:  Moderate by echo 08/2014  Will repeat to check EF and gradient given dyspnea  Headache: ESR normal   Carotids:  Duplex 05/2015  no restenosis post CEA  bilateral ASA  Dyspnea:   CRF:  Baseline Cr around 2 does not help with volume overload Anemia   F/U with me   6 months    Jenkins Rouge

## 2015-07-30 ENCOUNTER — Telehealth: Payer: Self-pay

## 2015-07-30 ENCOUNTER — Other Ambulatory Visit: Payer: Self-pay

## 2015-07-30 ENCOUNTER — Other Ambulatory Visit: Payer: Self-pay | Admitting: *Deleted

## 2015-07-30 ENCOUNTER — Ambulatory Visit (HOSPITAL_COMMUNITY): Payer: Medicare Other | Attending: Cardiology

## 2015-07-30 ENCOUNTER — Other Ambulatory Visit (HOSPITAL_COMMUNITY): Payer: Medicare Other

## 2015-07-30 DIAGNOSIS — E669 Obesity, unspecified: Secondary | ICD-10-CM | POA: Insufficient documentation

## 2015-07-30 DIAGNOSIS — I352 Nonrheumatic aortic (valve) stenosis with insufficiency: Secondary | ICD-10-CM | POA: Diagnosis not present

## 2015-07-30 DIAGNOSIS — I119 Hypertensive heart disease without heart failure: Secondary | ICD-10-CM | POA: Insufficient documentation

## 2015-07-30 DIAGNOSIS — I350 Nonrheumatic aortic (valve) stenosis: Secondary | ICD-10-CM

## 2015-07-30 DIAGNOSIS — E119 Type 2 diabetes mellitus without complications: Secondary | ICD-10-CM | POA: Diagnosis not present

## 2015-07-30 DIAGNOSIS — Z8249 Family history of ischemic heart disease and other diseases of the circulatory system: Secondary | ICD-10-CM | POA: Insufficient documentation

## 2015-07-30 DIAGNOSIS — E785 Hyperlipidemia, unspecified: Secondary | ICD-10-CM | POA: Insufficient documentation

## 2015-07-30 DIAGNOSIS — G4733 Obstructive sleep apnea (adult) (pediatric): Secondary | ICD-10-CM | POA: Insufficient documentation

## 2015-07-30 DIAGNOSIS — Z6833 Body mass index (BMI) 33.0-33.9, adult: Secondary | ICD-10-CM | POA: Diagnosis not present

## 2015-07-30 DIAGNOSIS — Z87891 Personal history of nicotine dependence: Secondary | ICD-10-CM | POA: Insufficient documentation

## 2015-07-30 DIAGNOSIS — I739 Peripheral vascular disease, unspecified: Secondary | ICD-10-CM | POA: Diagnosis not present

## 2015-07-30 MED ORDER — ALLOPURINOL 100 MG PO TABS: 100.0000 mg | ORAL_TABLET | Freq: Every day | ORAL | Status: AC

## 2015-07-30 NOTE — Telephone Encounter (Signed)
Patient needs echo before her office visit on Monday. Ordered and scheduled echo for today at 2 pm.

## 2015-08-02 ENCOUNTER — Encounter: Payer: Self-pay | Admitting: Cardiovascular Disease

## 2015-08-05 ENCOUNTER — Encounter: Payer: Medicare Other | Admitting: Cardiovascular Disease

## 2015-08-16 ENCOUNTER — Ambulatory Visit: Payer: Medicare Other

## 2015-08-20 ENCOUNTER — Other Ambulatory Visit: Payer: Self-pay | Admitting: Internal Medicine

## 2015-08-22 ENCOUNTER — Other Ambulatory Visit: Payer: Self-pay | Admitting: Neurology

## 2015-08-23 NOTE — Telephone Encounter (Signed)
Can you have plot take a look at this refill.  Pt son stopped by about it.

## 2015-08-26 ENCOUNTER — Encounter: Payer: Self-pay | Admitting: Internal Medicine

## 2015-08-30 ENCOUNTER — Ambulatory Visit: Payer: Medicare Other

## 2015-08-30 NOTE — Telephone Encounter (Signed)
Called refill into [facility] had to leave on pharmacy vm../lmb 

## 2015-09-02 ENCOUNTER — Ambulatory Visit: Payer: Self-pay | Admitting: Neurology

## 2015-09-02 ENCOUNTER — Ambulatory Visit: Payer: Self-pay | Admitting: Internal Medicine

## 2015-09-05 ENCOUNTER — Encounter: Payer: Self-pay | Admitting: Neurology

## 2015-09-05 ENCOUNTER — Ambulatory Visit (INDEPENDENT_AMBULATORY_CARE_PROVIDER_SITE_OTHER): Payer: Medicare Other | Admitting: Neurology

## 2015-09-05 VITALS — BP 136/58 | HR 55

## 2015-09-05 DIAGNOSIS — I6523 Occlusion and stenosis of bilateral carotid arteries: Secondary | ICD-10-CM

## 2015-09-05 DIAGNOSIS — M542 Cervicalgia: Secondary | ICD-10-CM

## 2015-09-05 DIAGNOSIS — R51 Headache: Secondary | ICD-10-CM

## 2015-09-05 DIAGNOSIS — M545 Low back pain, unspecified: Secondary | ICD-10-CM

## 2015-09-05 DIAGNOSIS — R519 Headache, unspecified: Secondary | ICD-10-CM

## 2015-09-05 MED ORDER — GABAPENTIN 100 MG PO CAPS: ORAL_CAPSULE | ORAL | Status: DC

## 2015-09-05 NOTE — Progress Notes (Signed)
NEUROLOGY FOLLOW UP OFFICE NOTE  Wendy Cole [id number]  HISTORY OF PRESENT ILLNESS: Wendy Cole is a 80 year old right-handed female with heart failure, paroxysmal atrial fibrillation, non-ischemic cardiomyopathy, OSA, hypertension, renal insufficiency and prior DVT and history of breast cancer status post right mastectomy whom I have seen for headaches now follows up for recent syncope.  History obtained by patient and husband, as well as hospital and PCP notes.    UPDATE: She was admitted to [facility] on 07/23/15 for syncope, although it appears that she fell while bent over.  CT of head revealed no acute intracranial abnormalities.  Carotid doppler showed patent bilateral CEA without evidence of restenosis or hyperplasia.  Telemetry revealed no arrhythmia.  During her hospitalization, she was delirious due to pain medication.  She was found to be fluid-overloaded due to heart failure, but refused Lasix due to reported allergy.  She was discharged on double home dose of Demadex.  2D echo from 07/30/15 showed EF 55-60% with grade 2 diastolic dysfunction and advanced aortic stenosis compared to prior study.  She continues to have pain and swelling in the legs.  MRA of head from 07/19/15 showed no aneurysm.  For headache, she was started on gabapentin and is taking 200mg  twice daily.  Headaches persist.  She also has longstanding mid low back pain.  It is non-radiating.  As a result, it is difficult to ambulate.  HISTORY: Onset:  7 years Location:  Right sided Quality:  Pressure.  She has right sided suboccipital and neck pain as well. Intensity:  8/10 Aura:  no Prodrome:  no Associated symptoms:  Sometimes nausea and vomiting, but may be separate from headache.  No vision loss. Duration:  Anywhere from 1 hour to all day Frequency:  daily  Current Antihypertensive medications:  Coreg, hydralazine, Imdur, Zaroxolyn, Demadex Current Antidepressant medications:  Bupropion  Sed  Rates 9 (04/26/15), 27 (04/12/15), 10 (11/07/14), 10 (08/31/14) She had two sisters with cerebral aneurysm and her father had AAA. MRA of head from 07/20/02 revealed no aneurysm She has history of migraines with visual aura.  PAST MEDICAL HISTORY: Past Medical History  Diagnosis Date  . Anemia   . Anxiety states   . Depressive disorder, not elsewhere classified   . Type II or unspecified type diabetes mellitus without mention of complication, not stated as uncontrolled   . Hyperlipidemia   . Unspecified essential hypertension   . Renal insufficiency   . Osteoarthritis   . Gout   . Iritis   . Nonischemic cardiomyopathy (HCC)     EF 20 to 25% per echo 06/2011  . Chronic anticoagulation   . NSTEMI (non-ST elevated myocardial infarction) [facility]) Feb 2013    06/2011 cath  Mild nonobstructive disease. No LV gram  . PAF (paroxysmal atrial fibrillation) ([place])   . Obesity   . Cancer [facility]) 2010    Right breast  . DVT (deep venous thrombosis) ([place]) 2010  . Stroke [facility]) 2004    Left upper lobe  . Thyroid disease 1969    Three fourth of Thyroid removed  . Complication of anesthesia     "fights it", for colonoscopy  . HOH (hard of hearing)     deaf in L completely  . OSA on CPAP     CPAP at night  . GERD (gastroesophageal reflux disease)   . Hypothyroidism   . Peripheral vascular disease [facility]) Jan. 2015    MEDICATIONS: Current Outpatient Prescriptions on File Prior to Visit  Medication Sig Dispense Refill  . acetaminophen (TYLENOL) 500 MG tablet Take 500 mg by mouth daily as needed for mild pain, moderate pain or headache.     . albuterol (PROVENTIL HFA;VENTOLIN HFA) 108 (90 BASE) MCG/ACT inhaler Inhale 2 puffs into the lungs every 6 (six) hours as needed for wheezing or shortness of breath. 1 Inhaler 6  . allopurinol (ZYLOPRIM) 100 MG tablet Take 1 tablet (100 mg total) by mouth daily. 90 tablet 3  . brimonidine (ALPHAGAN) 0.15 % ophthalmic solution Place 1 drop into both eyes daily.       [name] buPROPion (WELLBUTRIN SR) 150 MG 12 hr tablet Take 150 mg by mouth daily.   3  . carvedilol (COREG) 12.5 MG tablet Take 12.5-25 mg by mouth 2 (two) times daily with a meal.     . cholecalciferol (VITAMIN D) 1000 units tablet Take 1,000 Units by mouth 2 (two) times daily.    . clorazepate (TRANXENE) 7.5 MG tablet TAKE 1 TABLET BY MOUTH 2 TIMES DAILY AS NEEDED 60 tablet 3  . ferrous sulfate 325 (65 FE) MG tablet Take 1 tablet (325 mg total) by mouth daily with breakfast. 30 tablet 1  . Fluticasone-Salmeterol (ADVAIR DISKUS) 100-50 MCG/DOSE AEPB Inhale 1 puff into the lungs every 12 (twelve) hours. Discontinue all previous refills for this medication. Hold/Fill when appropriate according to pt rx fill schedule. 180 each 3  . hydrALAZINE (APRESOLINE) 50 MG tablet Take 1 tablet (50 mg total) by mouth 3 (three) times daily. 90 tablet 0  . isosorbide mononitrate (IMDUR) 60 MG 24 hr tablet TAKE 1 TABLET (60 MG TOTAL) BY MOUTH DAILY. 30 tablet 5  . levothyroxine (SYNTHROID, LEVOTHROID) 137 MCG tablet TAKE 1 TABLET BY MOUTH DAILY 30 tablet 5  . prednisoLONE acetate (PRED FORTE) 1 % ophthalmic suspension Place 1 drop into both eyes as needed (eye inflammation). For eye pain    . predniSONE (DELTASONE) 5 MG tablet Take 1 tablet (5 mg total) by mouth daily with breakfast. Prednisone: take a total of 5 mg/d pc 90 tablet 3  . timolol (TIMOPTIC) 0.5 % ophthalmic solution Place 1 drop into both eyes 2 (two) times daily.     [name] torsemide (DEMADEX) 20 MG tablet Take 1 tablet (20 mg total) by mouth daily. 60 tablet 5  . traMADol (ULTRAM) 50 MG tablet TAKE 1-2 TABLETS BY MOUTH 2 TIMES DAILY AS NEEDED FOR MODERATE OR SEVERE PAIN 100 tablet 3  . warfarin (COUMADIN) 3 MG tablet TAKE AS DIRECTED BY ANTICOAGULATION CLINIC (Patient taking differently: Take 3 mg by mouth daily at bedtime.) 30 tablet 3   No current facility-administered medications on file prior to visit.    ALLERGIES: Allergies  Allergen Reactions  .  Amlodipine     edema  . Atorvastatin Other (See Comments)    Muscle aches  . Colesevelam Other (See Comments)    unknown  . Coumadin [Warfarin Sodium]     "causes cramps" brand name only per pt  . Lasix [Furosemide] Other (See Comments)    Muscle cramps  . Statins Other (See Comments)    Muscle aches  . Tape Rash    FAMILY HISTORY: Family History  Problem Relation Age of Onset  . Hypertension    . Congestive Heart Failure Mother 23  . Breast cancer Mother   . Heart attack Mother   . Heart disease Father 81  . Heart disease Brother   . Coronary artery disease Son 40    2 Stents  .  Heart attack Father   . AAA (abdominal aortic aneurysm) Brother     SOCIAL HISTORY: Social History   Social History  . Marital Status: Married    Spouse Name: N/A  . Number of Children: 3  . Years of Education: N/A   Occupational History  . Retired    Social History Main Topics  . Smoking status: Former Smoker -- 2.00 packs/day for 40 years    Types: Cigarettes    Quit date: 05/25/2002  . Smokeless tobacco: Never Used  . Alcohol Use: No  . Drug Use: No  . Sexual Activity: Not Currently   Other Topics Concern  . Not on file   Social History Narrative   Lives with husband and youngest son.      REVIEW OF SYSTEMS: Constitutional: No fevers, chills, or sweats, no generalized fatigue, change in appetite Eyes: No visual changes, double vision, eye pain Ear, nose and throat: No hearing loss, ear pain, nasal congestion, sore throat Cardiovascular: No chest pain, palpitations Respiratory:  No shortness of breath at rest or with exertion, wheezes GastrointestinaI: No nausea, vomiting, diarrhea, abdominal pain, fecal incontinence Genitourinary:  No dysuria, urinary retention or frequency Musculoskeletal:  Neck pain, back pain Integumentary: No rash, pruritus, skin lesions Neurological: as above Psychiatric: No depression, insomnia, anxiety Endocrine: No palpitations, fatigue,  diaphoresis, mood swings, change in appetite, change in weight, increased thirst Hematologic/Lymphatic:  No purpura, petechiae. Allergic/Immunologic: no itchy/runny eyes, nasal congestion, recent allergic reactions, rashes  PHYSICAL EXAM: Filed Vitals:   09/05/15 1552  BP: 136/58  Pulse: 55   General: No acute distress. Head:  Normocephalic/atraumatic Eyes:  Fundoscopic exam unremarkable without vessel changes, exudates, hemorrhages or papilledema. Neck: supple, no paraspinal tenderness, full range of motion Heart:  Regular rate and rhythm Lungs:  Clear to auscultation bilaterally Back: bilateral paraspinal tenderness Neurological Exam: alert and oriented to person, place, and time. Attention span and concentration intact, recent and remote memory intact, fund of knowledge intact.  Speech fluent and not dysarthric, language intact.  CN II-XII intact. Bulk and tone normal, muscle strength 5/5 throughout.  Sensation to light touch, temperature and vibration reduced in feet.  Deep tendon reflexes absent throughout, toes downgoing.  Finger to nose testing intact.  Antalgic gait.  Flexed posture.  IMPRESSION: Headache, possibly cervicogenic Lumbago  PLAN: 1.  Titrate gabapentin up by 100mg  per week to goal of 400mg  twice daily. They are instructed to call in 4 weeks with update and we can refill with 400mg  capsules. 2.  Check X-ray of cervical and lumbar spine to look for arthritis 3.  Follow up in 3 months.  27 minutes spent face to face with patient, over 50% spent discussing management and diagnosis.  Wendy Clines, DO  CC: Lew Dawes, MD

## 2015-09-05 NOTE — Patient Instructions (Signed)
1.  We will increase gabapentin 100mg  capsules as follows:  Take 2 pills in AM and 3 pills at night for 7 days  Then 3 pills in AM and 3 pills at night for 7 days  Then 3 pills in AM and 4 pills at night for 7 days  Then call for refill and I can prescribe you 400mg  capsules  2.  Will get X-rays of cervical and lumbar spines 3.  Follow up in 3 months.

## 2015-09-13 ENCOUNTER — Ambulatory Visit: Payer: Medicare Other

## 2015-09-16 ENCOUNTER — Encounter: Payer: Self-pay | Admitting: Internal Medicine

## 2015-09-20 ENCOUNTER — Other Ambulatory Visit: Payer: Self-pay | Admitting: Internal Medicine

## 2015-09-27 ENCOUNTER — Ambulatory Visit: Payer: Medicare Other

## 2015-09-27 ENCOUNTER — Encounter: Payer: Self-pay | Admitting: Neurology

## 2015-09-28 ENCOUNTER — Encounter: Payer: Self-pay | Admitting: Internal Medicine

## 2015-09-30 ENCOUNTER — Other Ambulatory Visit: Payer: Self-pay | Admitting: Internal Medicine

## 2015-10-01 ENCOUNTER — Encounter: Payer: Self-pay | Admitting: Neurology

## 2015-10-01 MED ORDER — GABAPENTIN 400 MG PO CAPS: 400.0000 mg | ORAL_CAPSULE | Freq: Two times a day (BID) | ORAL | Status: AC

## 2015-10-01 NOTE — Telephone Encounter (Signed)
Pt husband state that patient is out of medication please call in a refill pt phone number is [phone]

## 2015-10-04 ENCOUNTER — Ambulatory Visit (INDEPENDENT_AMBULATORY_CARE_PROVIDER_SITE_OTHER): Payer: Medicare Other | Admitting: General Practice

## 2015-10-04 ENCOUNTER — Telehealth: Payer: Self-pay | Admitting: General Practice

## 2015-10-04 DIAGNOSIS — Z5181 Encounter for therapeutic drug level monitoring: Secondary | ICD-10-CM

## 2015-10-04 DIAGNOSIS — I82402 Acute embolism and thrombosis of unspecified deep veins of left lower extremity: Secondary | ICD-10-CM | POA: Diagnosis not present

## 2015-10-04 DIAGNOSIS — I480 Paroxysmal atrial fibrillation: Secondary | ICD-10-CM

## 2015-10-04 DIAGNOSIS — I82509 Chronic embolism and thrombosis of unspecified deep veins of unspecified lower extremity: Secondary | ICD-10-CM

## 2015-10-04 DIAGNOSIS — Z7901 Long term (current) use of anticoagulants: Secondary | ICD-10-CM | POA: Diagnosis not present

## 2015-10-04 DIAGNOSIS — Z86718 Personal history of other venous thrombosis and embolism: Secondary | ICD-10-CM | POA: Diagnosis not present

## 2015-10-04 LAB — POCT INR: INR: 6.7

## 2015-10-04 NOTE — Progress Notes (Signed)
Pre visit review using our clinic review tool, if applicable. No additional management support is needed unless otherwise documented below in the visit note. INR is high today.  Patient has been unable to come to appointments for several weeks due to transportation and ambulation issues.  Patient denies taking extra coumadin and says that she is taking it as dosed.  Coumadin is being held for1 week and INR will be checked again next Friday.  Patient states that she is using a pill box to help manage medications.  I instructed patient to go to ER if any unusual bleeding occurs.

## 2015-10-04 NOTE — Progress Notes (Signed)
I have reviewed and agree with the plan. 

## 2015-10-04 NOTE — Telephone Encounter (Signed)
Spoke with patient's husband about getting a home meter to check INR.  Pt and husband are open to that option.  I will see patient again to check INR next Friday and we will discuss it more then.

## 2015-10-05 ENCOUNTER — Emergency Department (HOSPITAL_COMMUNITY): Payer: Medicare Other

## 2015-10-05 ENCOUNTER — Encounter (HOSPITAL_COMMUNITY): Payer: Self-pay

## 2015-10-05 ENCOUNTER — Inpatient Hospital Stay (HOSPITAL_COMMUNITY)
Admission: EM | Admit: 2015-10-05 | Discharge: 2015-10-30 | DRG: 377 | Disposition: A | Discharge status: Skilled Nursing Facility | Payer: Medicare Other | Attending: Family Medicine | Admitting: Family Medicine

## 2015-10-05 DIAGNOSIS — E1151 Type 2 diabetes mellitus with diabetic peripheral angiopathy without gangrene: Secondary | ICD-10-CM | POA: Diagnosis present

## 2015-10-05 DIAGNOSIS — E1122 Type 2 diabetes mellitus with diabetic chronic kidney disease: Secondary | ICD-10-CM | POA: Diagnosis present

## 2015-10-05 DIAGNOSIS — J0100 Acute maxillary sinusitis, unspecified: Secondary | ICD-10-CM

## 2015-10-05 DIAGNOSIS — R1084 Generalized abdominal pain: Secondary | ICD-10-CM | POA: Diagnosis not present

## 2015-10-05 DIAGNOSIS — F419 Anxiety disorder, unspecified: Secondary | ICD-10-CM

## 2015-10-05 DIAGNOSIS — D62 Acute posthemorrhagic anemia: Secondary | ICD-10-CM | POA: Diagnosis present

## 2015-10-05 DIAGNOSIS — I5032 Chronic diastolic (congestive) heart failure: Secondary | ICD-10-CM | POA: Diagnosis not present

## 2015-10-05 DIAGNOSIS — I82509 Chronic embolism and thrombosis of unspecified deep veins of unspecified lower extremity: Secondary | ICD-10-CM | POA: Diagnosis not present

## 2015-10-05 DIAGNOSIS — Z86718 Personal history of other venous thrombosis and embolism: Secondary | ICD-10-CM | POA: Diagnosis not present

## 2015-10-05 DIAGNOSIS — Z66 Do not resuscitate: Secondary | ICD-10-CM | POA: Diagnosis present

## 2015-10-05 DIAGNOSIS — F32A Depression, unspecified: Secondary | ICD-10-CM

## 2015-10-05 DIAGNOSIS — M21619 Bunion of unspecified foot: Secondary | ICD-10-CM

## 2015-10-05 DIAGNOSIS — E662 Morbid (severe) obesity with alveolar hypoventilation: Secondary | ICD-10-CM | POA: Diagnosis present

## 2015-10-05 DIAGNOSIS — I480 Paroxysmal atrial fibrillation: Secondary | ICD-10-CM | POA: Diagnosis not present

## 2015-10-05 DIAGNOSIS — I130 Hypertensive heart and chronic kidney disease with heart failure and stage 1 through stage 4 chronic kidney disease, or unspecified chronic kidney disease: Secondary | ICD-10-CM | POA: Diagnosis present

## 2015-10-05 DIAGNOSIS — B951 Streptococcus, group B, as the cause of diseases classified elsewhere: Secondary | ICD-10-CM | POA: Diagnosis present

## 2015-10-05 DIAGNOSIS — E1165 Type 2 diabetes mellitus with hyperglycemia: Secondary | ICD-10-CM | POA: Diagnosis present

## 2015-10-05 DIAGNOSIS — K5900 Constipation, unspecified: Secondary | ICD-10-CM | POA: Diagnosis not present

## 2015-10-05 DIAGNOSIS — R32 Unspecified urinary incontinence: Secondary | ICD-10-CM | POA: Diagnosis present

## 2015-10-05 DIAGNOSIS — E871 Hypo-osmolality and hyponatremia: Secondary | ICD-10-CM | POA: Diagnosis not present

## 2015-10-05 DIAGNOSIS — E876 Hypokalemia: Secondary | ICD-10-CM | POA: Diagnosis not present

## 2015-10-05 DIAGNOSIS — I8291 Chronic embolism and thrombosis of unspecified vein: Secondary | ICD-10-CM | POA: Diagnosis present

## 2015-10-05 DIAGNOSIS — G92 Toxic encephalopathy: Secondary | ICD-10-CM | POA: Diagnosis present

## 2015-10-05 DIAGNOSIS — G4733 Obstructive sleep apnea (adult) (pediatric): Secondary | ICD-10-CM

## 2015-10-05 DIAGNOSIS — I509 Heart failure, unspecified: Secondary | ICD-10-CM | POA: Diagnosis not present

## 2015-10-05 DIAGNOSIS — N390 Urinary tract infection, site not specified: Secondary | ICD-10-CM

## 2015-10-05 DIAGNOSIS — R600 Localized edema: Secondary | ICD-10-CM | POA: Diagnosis not present

## 2015-10-05 DIAGNOSIS — I639 Cerebral infarction, unspecified: Secondary | ICD-10-CM | POA: Diagnosis present

## 2015-10-05 DIAGNOSIS — M1000 Idiopathic gout, unspecified site: Secondary | ICD-10-CM

## 2015-10-05 DIAGNOSIS — M1990 Unspecified osteoarthritis, unspecified site: Secondary | ICD-10-CM | POA: Diagnosis present

## 2015-10-05 DIAGNOSIS — E039 Hypothyroidism, unspecified: Secondary | ICD-10-CM

## 2015-10-05 DIAGNOSIS — K922 Gastrointestinal hemorrhage, unspecified: Principal | ICD-10-CM

## 2015-10-05 DIAGNOSIS — H209 Unspecified iridocyclitis: Secondary | ICD-10-CM

## 2015-10-05 DIAGNOSIS — Z9011 Acquired absence of right breast and nipple: Secondary | ICD-10-CM | POA: Diagnosis not present

## 2015-10-05 DIAGNOSIS — E118 Type 2 diabetes mellitus with unspecified complications: Secondary | ICD-10-CM | POA: Diagnosis present

## 2015-10-05 DIAGNOSIS — Z6830 Body mass index (BMI) 30.0-30.9, adult: Secondary | ICD-10-CM | POA: Diagnosis not present

## 2015-10-05 DIAGNOSIS — F329 Major depressive disorder, single episode, unspecified: Secondary | ICD-10-CM | POA: Diagnosis present

## 2015-10-05 DIAGNOSIS — M7989 Other specified soft tissue disorders: Secondary | ICD-10-CM

## 2015-10-05 DIAGNOSIS — I429 Cardiomyopathy, unspecified: Secondary | ICD-10-CM

## 2015-10-05 DIAGNOSIS — IMO0002 Reserved for concepts with insufficient information to code with codable children: Secondary | ICD-10-CM | POA: Diagnosis present

## 2015-10-05 DIAGNOSIS — J9601 Acute respiratory failure with hypoxia: Secondary | ICD-10-CM

## 2015-10-05 DIAGNOSIS — I5033 Acute on chronic diastolic (congestive) heart failure: Secondary | ICD-10-CM | POA: Diagnosis present

## 2015-10-05 DIAGNOSIS — E1140 Type 2 diabetes mellitus with diabetic neuropathy, unspecified: Secondary | ICD-10-CM | POA: Diagnosis present

## 2015-10-05 DIAGNOSIS — Z452 Encounter for adjustment and management of vascular access device: Secondary | ICD-10-CM

## 2015-10-05 DIAGNOSIS — Z8673 Personal history of transient ischemic attack (TIA), and cerebral infarction without residual deficits: Secondary | ICD-10-CM | POA: Diagnosis not present

## 2015-10-05 DIAGNOSIS — M353 Polymyalgia rheumatica: Secondary | ICD-10-CM

## 2015-10-05 DIAGNOSIS — R4182 Altered mental status, unspecified: Secondary | ICD-10-CM

## 2015-10-05 DIAGNOSIS — R400 Somnolence: Secondary | ICD-10-CM | POA: Diagnosis not present

## 2015-10-05 DIAGNOSIS — I252 Old myocardial infarction: Secondary | ICD-10-CM

## 2015-10-05 DIAGNOSIS — L03116 Cellulitis of left lower limb: Secondary | ICD-10-CM

## 2015-10-05 DIAGNOSIS — J449 Chronic obstructive pulmonary disease, unspecified: Secondary | ICD-10-CM | POA: Diagnosis present

## 2015-10-05 DIAGNOSIS — R011 Cardiac murmur, unspecified: Secondary | ICD-10-CM

## 2015-10-05 DIAGNOSIS — Z7901 Long term (current) use of anticoagulants: Secondary | ICD-10-CM | POA: Diagnosis not present

## 2015-10-05 DIAGNOSIS — R739 Hyperglycemia, unspecified: Secondary | ICD-10-CM

## 2015-10-05 DIAGNOSIS — E669 Obesity, unspecified: Secondary | ICD-10-CM

## 2015-10-05 DIAGNOSIS — H5702 Anisocoria: Secondary | ICD-10-CM

## 2015-10-05 DIAGNOSIS — F23 Brief psychotic disorder: Secondary | ICD-10-CM | POA: Diagnosis not present

## 2015-10-05 DIAGNOSIS — Z853 Personal history of malignant neoplasm of breast: Secondary | ICD-10-CM

## 2015-10-05 DIAGNOSIS — R410 Disorientation, unspecified: Secondary | ICD-10-CM | POA: Diagnosis not present

## 2015-10-05 DIAGNOSIS — R0602 Shortness of breath: Secondary | ICD-10-CM | POA: Diagnosis present

## 2015-10-05 DIAGNOSIS — J810 Acute pulmonary edema: Secondary | ICD-10-CM

## 2015-10-05 DIAGNOSIS — D689 Coagulation defect, unspecified: Secondary | ICD-10-CM | POA: Diagnosis present

## 2015-10-05 DIAGNOSIS — Z7952 Long term (current) use of systemic steroids: Secondary | ICD-10-CM

## 2015-10-05 DIAGNOSIS — C801 Malignant (primary) neoplasm, unspecified: Secondary | ICD-10-CM

## 2015-10-05 DIAGNOSIS — R55 Syncope and collapse: Secondary | ICD-10-CM

## 2015-10-05 DIAGNOSIS — I2510 Atherosclerotic heart disease of native coronary artery without angina pectoris: Secondary | ICD-10-CM

## 2015-10-05 DIAGNOSIS — Z5181 Encounter for therapeutic drug level monitoring: Secondary | ICD-10-CM

## 2015-10-05 DIAGNOSIS — I10 Essential (primary) hypertension: Secondary | ICD-10-CM | POA: Diagnosis not present

## 2015-10-05 DIAGNOSIS — F0390 Unspecified dementia without behavioral disturbance: Secondary | ICD-10-CM | POA: Diagnosis present

## 2015-10-05 DIAGNOSIS — E785 Hyperlipidemia, unspecified: Secondary | ICD-10-CM | POA: Diagnosis present

## 2015-10-05 DIAGNOSIS — N183 Chronic kidney disease, stage 3 unspecified: Secondary | ICD-10-CM | POA: Diagnosis present

## 2015-10-05 DIAGNOSIS — Z888 Allergy status to other drugs, medicaments and biological substances status: Secondary | ICD-10-CM

## 2015-10-05 DIAGNOSIS — H9190 Unspecified hearing loss, unspecified ear: Secondary | ICD-10-CM | POA: Diagnosis present

## 2015-10-05 DIAGNOSIS — I428 Other cardiomyopathies: Secondary | ICD-10-CM

## 2015-10-05 DIAGNOSIS — Z8249 Family history of ischemic heart disease and other diseases of the circulatory system: Secondary | ICD-10-CM

## 2015-10-05 DIAGNOSIS — R791 Abnormal coagulation profile: Secondary | ICD-10-CM

## 2015-10-05 DIAGNOSIS — B3749 Other urogenital candidiasis: Secondary | ICD-10-CM | POA: Diagnosis present

## 2015-10-05 DIAGNOSIS — R519 Headache, unspecified: Secondary | ICD-10-CM

## 2015-10-05 DIAGNOSIS — I5023 Acute on chronic systolic (congestive) heart failure: Secondary | ICD-10-CM

## 2015-10-05 DIAGNOSIS — M109 Gout, unspecified: Secondary | ICD-10-CM | POA: Diagnosis present

## 2015-10-05 DIAGNOSIS — N12 Tubulo-interstitial nephritis, not specified as acute or chronic: Secondary | ICD-10-CM

## 2015-10-05 DIAGNOSIS — R195 Other fecal abnormalities: Secondary | ICD-10-CM | POA: Diagnosis not present

## 2015-10-05 DIAGNOSIS — I420 Dilated cardiomyopathy: Secondary | ICD-10-CM | POA: Diagnosis present

## 2015-10-05 DIAGNOSIS — R7881 Bacteremia: Secondary | ICD-10-CM | POA: Diagnosis present

## 2015-10-05 DIAGNOSIS — D72829 Elevated white blood cell count, unspecified: Secondary | ICD-10-CM

## 2015-10-05 DIAGNOSIS — N184 Chronic kidney disease, stage 4 (severe): Secondary | ICD-10-CM

## 2015-10-05 DIAGNOSIS — I6523 Occlusion and stenosis of bilateral carotid arteries: Secondary | ICD-10-CM

## 2015-10-05 DIAGNOSIS — G9340 Encephalopathy, unspecified: Secondary | ICD-10-CM | POA: Diagnosis not present

## 2015-10-05 DIAGNOSIS — E1159 Type 2 diabetes mellitus with other circulatory complications: Secondary | ICD-10-CM

## 2015-10-05 DIAGNOSIS — M79605 Pain in left leg: Secondary | ICD-10-CM

## 2015-10-05 DIAGNOSIS — K219 Gastro-esophageal reflux disease without esophagitis: Secondary | ICD-10-CM | POA: Diagnosis present

## 2015-10-05 DIAGNOSIS — Z79899 Other long term (current) drug therapy: Secondary | ICD-10-CM

## 2015-10-05 DIAGNOSIS — N179 Acute kidney failure, unspecified: Secondary | ICD-10-CM | POA: Diagnosis present

## 2015-10-05 DIAGNOSIS — J811 Chronic pulmonary edema: Secondary | ICD-10-CM

## 2015-10-05 DIAGNOSIS — Z87891 Personal history of nicotine dependence: Secondary | ICD-10-CM | POA: Diagnosis not present

## 2015-10-05 DIAGNOSIS — R6510 Systemic inflammatory response syndrome (SIRS) of non-infectious origin without acute organ dysfunction: Secondary | ICD-10-CM

## 2015-10-05 DIAGNOSIS — R109 Unspecified abdominal pain: Secondary | ICD-10-CM

## 2015-10-05 DIAGNOSIS — R404 Transient alteration of awareness: Secondary | ICD-10-CM | POA: Diagnosis not present

## 2015-10-05 DIAGNOSIS — E038 Other specified hypothyroidism: Secondary | ICD-10-CM | POA: Diagnosis not present

## 2015-10-05 DIAGNOSIS — R51 Headache: Secondary | ICD-10-CM

## 2015-10-05 DIAGNOSIS — Z9989 Dependence on other enabling machines and devices: Secondary | ICD-10-CM

## 2015-10-05 DIAGNOSIS — D649 Anemia, unspecified: Secondary | ICD-10-CM | POA: Diagnosis not present

## 2015-10-05 DIAGNOSIS — I5022 Chronic systolic (congestive) heart failure: Secondary | ICD-10-CM

## 2015-10-05 DIAGNOSIS — R0609 Other forms of dyspnea: Secondary | ICD-10-CM

## 2015-10-05 DIAGNOSIS — K120 Recurrent oral aphthae: Secondary | ICD-10-CM

## 2015-10-05 DIAGNOSIS — I63419 Cerebral infarction due to embolism of unspecified middle cerebral artery: Secondary | ICD-10-CM

## 2015-10-05 LAB — CBC
HCT: 23.2 % — ABNORMAL LOW (ref 36.0–46.0)
HEMOGLOBIN: 5.8 g/dL — AB (ref 12.0–15.0)
MCH: 18.2 pg — ABNORMAL LOW (ref 26.0–34.0)
MCHC: 25 g/dL — ABNORMAL LOW (ref 30.0–36.0)
MCV: 72.7 fL — ABNORMAL LOW (ref 78.0–100.0)
PLATELETS: 302 10*3/uL (ref 150–400)
RBC: 3.19 MIL/uL — AB (ref 3.87–5.11)
RDW: 19.8 % — ABNORMAL HIGH (ref 11.5–15.5)
WBC: 10.9 10*3/uL — AB (ref 4.0–10.5)

## 2015-10-05 LAB — I-STAT ARTERIAL BLOOD GAS, ED
Acid-base deficit: 9 mmol/L — ABNORMAL HIGH (ref 0.0–2.0)
Bicarbonate: 16 mEq/L — ABNORMAL LOW (ref 20.0–24.0)
O2 Saturation: 96 %
Patient temperature: 98.6
TCO2: 17 mmol/L (ref 0–100)
pCO2 arterial: 28 mmHg — ABNORMAL LOW (ref 35.0–45.0)
pH, Arterial: 7.364 (ref 7.350–7.450)
pO2, Arterial: 82 mmHg (ref 80.0–100.0)

## 2015-10-05 LAB — PROTIME-INR
INR: 5.13 (ref 0.00–1.49)
Prothrombin Time: 45.8 seconds — ABNORMAL HIGH (ref 11.6–15.2)

## 2015-10-05 LAB — COMPREHENSIVE METABOLIC PANEL
ALT: 14 U/L (ref 14–54)
AST: 18 U/L (ref 15–41)
Albumin: 3 g/dL — ABNORMAL LOW (ref 3.5–5.0)
Alkaline Phosphatase: 79 U/L (ref 38–126)
Anion gap: 10 (ref 5–15)
BUN: 52 mg/dL — ABNORMAL HIGH (ref 6–20)
CHLORIDE: 109 mmol/L (ref 101–111)
CO2: 16 mmol/L — AB (ref 22–32)
Calcium: 8.4 mg/dL — ABNORMAL LOW (ref 8.9–10.3)
Creatinine, Ser: 2.68 mg/dL — ABNORMAL HIGH (ref 0.44–1.00)
GFR, EST AFRICAN AMERICAN: 19 mL/min — AB (ref >60)
GFR, EST NON AFRICAN AMERICAN: 16 mL/min — AB (ref >60)
Glucose, Bld: 108 mg/dL — ABNORMAL HIGH (ref 65–99)
POTASSIUM: 4.7 mmol/L (ref 3.5–5.1)
SODIUM: 135 mmol/L (ref 135–145)
Total Bilirubin: 0.9 mg/dL (ref 0.3–1.2)
Total Protein: 6 g/dL — ABNORMAL LOW (ref 6.5–8.1)

## 2015-10-05 LAB — I-STAT TROPONIN, ED: TROPONIN I, POC: 0.03 ng/mL (ref 0.00–0.08)

## 2015-10-05 LAB — LIPID PANEL
CHOL/HDL RATIO: 4.1 ratio
Cholesterol: 98 mg/dL (ref 0–200)
HDL: 24 mg/dL — AB (ref >40)
LDL CALC: 56 mg/dL (ref 0–99)
TRIGLYCERIDES: 90 mg/dL (ref <150)
VLDL: 18 mg/dL (ref 0–40)

## 2015-10-05 LAB — BRAIN NATRIURETIC PEPTIDE: B Natriuretic Peptide: 1637.7 pg/mL — ABNORMAL HIGH (ref 0.0–100.0)

## 2015-10-05 LAB — URINALYSIS, ROUTINE W REFLEX MICROSCOPIC
BILIRUBIN URINE: NEGATIVE
GLUCOSE, UA: NEGATIVE mg/dL
KETONES UR: NEGATIVE mg/dL
Nitrite: NEGATIVE
PH: 5 (ref 5.0–8.0)
Protein, ur: 100 mg/dL — AB
Specific Gravity, Urine: 1.019 (ref 1.005–1.030)

## 2015-10-05 LAB — TSH: TSH: 1.897 u[IU]/mL (ref 0.350–4.500)

## 2015-10-05 LAB — PREPARE RBC (CROSSMATCH)

## 2015-10-05 LAB — POC OCCULT BLOOD, ED: Fecal Occult Bld: POSITIVE — AB

## 2015-10-05 LAB — TROPONIN I
TROPONIN I: 0.06 ng/mL — AB (ref <0.031)
TROPONIN I: 0.03 ng/mL (ref <0.031)

## 2015-10-05 LAB — URINE MICROSCOPIC-ADD ON

## 2015-10-05 LAB — MAGNESIUM: Magnesium: 1.7 mg/dL (ref 1.7–2.4)

## 2015-10-05 LAB — MRSA PCR SCREENING: MRSA by PCR: NEGATIVE

## 2015-10-05 MED ORDER — MOMETASONE FURO-FORMOTEROL FUM 100-5 MCG/ACT IN AERO
2.0000 | INHALATION_SPRAY | Freq: Two times a day (BID) | RESPIRATORY_TRACT | Status: DC
  Administered 2015-10-07 – 2015-10-30 (×32): 2 via RESPIRATORY_TRACT
  Filled 2015-10-05 (×2): qty 8.8

## 2015-10-05 MED ORDER — ALBUTEROL SULFATE HFA 108 (90 BASE) MCG/ACT IN AERS: 2.0000 | INHALATION_SPRAY | Freq: Four times a day (QID) | RESPIRATORY_TRACT | Status: DC | PRN

## 2015-10-05 MED ORDER — BUPROPION HCL ER (SR) 150 MG PO TB12
150.0000 mg | ORAL_TABLET | Freq: Every day | ORAL | Status: DC
  Administered 2015-10-06 – 2015-10-30 (×23): 150 mg via ORAL
  Filled 2015-10-05 (×24): qty 1

## 2015-10-05 MED ORDER — ASPIRIN EC 81 MG PO TBEC
81.0000 mg | DELAYED_RELEASE_TABLET | Freq: Every day | ORAL | Status: DC
  Administered 2015-10-06 – 2015-10-30 (×23): 81 mg via ORAL
  Filled 2015-10-05 (×23): qty 1

## 2015-10-05 MED ORDER — HYDROCODONE-ACETAMINOPHEN 5-325 MG PO TABS
2.0000 | ORAL_TABLET | Freq: Once | ORAL | Status: DC
  Filled 2015-10-05: qty 2

## 2015-10-05 MED ORDER — CARVEDILOL 12.5 MG PO TABS
12.5000 mg | ORAL_TABLET | Freq: Two times a day (BID) | ORAL | Status: DC
  Administered 2015-10-05 – 2015-10-11 (×9): 12.5 mg via ORAL
  Filled 2015-10-05 (×11): qty 1

## 2015-10-05 MED ORDER — SODIUM CHLORIDE 0.9% FLUSH
3.0000 mL | Freq: Two times a day (BID) | INTRAVENOUS | Status: DC
  Administered 2015-10-05: 3 mL via INTRAVENOUS

## 2015-10-05 MED ORDER — BRIMONIDINE TARTRATE 0.15 % OP SOLN
1.0000 [drp] | Freq: Every day | OPHTHALMIC | Status: DC
  Filled 2015-10-05: qty 5

## 2015-10-05 MED ORDER — TORSEMIDE 20 MG PO TABS
20.0000 mg | ORAL_TABLET | Freq: Every day | ORAL | Status: DC
  Filled 2015-10-05: qty 1

## 2015-10-05 MED ORDER — SODIUM CHLORIDE 0.9% FLUSH
10.0000 mL | INTRAVENOUS | Status: DC | PRN
  Administered 2015-10-12 – 2015-10-22 (×5): 20 mL
  Administered 2015-10-23 – 2015-10-26 (×3): 10 mL
  Administered 2015-10-26: 20 mL
  Administered 2015-10-27 – 2015-10-28 (×3): 10 mL
  Administered 2015-10-29: 20 mL
  Administered 2015-10-29: 10 mL
  Filled 2015-10-05 (×14): qty 40

## 2015-10-05 MED ORDER — SODIUM CHLORIDE 0.9 % IV SOLN: 250.0000 mL | INTRAVENOUS | Status: DC | PRN

## 2015-10-05 MED ORDER — ACETAMINOPHEN 500 MG PO TABS: 500.0000 mg | ORAL_TABLET | Freq: Every day | ORAL | Status: DC | PRN

## 2015-10-05 MED ORDER — VITAMIN D 1000 UNITS PO TABS
1000.0000 [IU] | ORAL_TABLET | Freq: Two times a day (BID) | ORAL | Status: DC
  Administered 2015-10-05 – 2015-10-30 (×43): 1000 [IU] via ORAL
  Filled 2015-10-05 (×51): qty 1

## 2015-10-05 MED ORDER — BUMETANIDE 0.25 MG/ML IJ SOLN
2.0000 mg | INTRAMUSCULAR | Status: AC
  Administered 2015-10-05 – 2015-10-06 (×3): 2 mg via INTRAVENOUS
  Filled 2015-10-05 (×3): qty 8

## 2015-10-05 MED ORDER — PANTOPRAZOLE SODIUM 40 MG IV SOLR
40.0000 mg | Freq: Once | INTRAVENOUS | Status: AC
  Administered 2015-10-05: 40 mg via INTRAVENOUS
  Filled 2015-10-05: qty 40

## 2015-10-05 MED ORDER — ONDANSETRON HCL 4 MG/2ML IJ SOLN
4.0000 mg | Freq: Four times a day (QID) | INTRAMUSCULAR | Status: DC | PRN
  Administered 2015-10-05 – 2015-10-18 (×3): 4 mg via INTRAVENOUS
  Filled 2015-10-05 (×3): qty 2

## 2015-10-05 MED ORDER — BUMETANIDE 0.25 MG/ML IJ SOLN
2.0000 mg | Freq: Three times a day (TID) | INTRAMUSCULAR | Status: DC
Start: 2015-10-06 — End: 2015-10-06
  Filled 2015-10-05: qty 8

## 2015-10-05 MED ORDER — ALLOPURINOL 100 MG PO TABS
100.0000 mg | ORAL_TABLET | Freq: Every day | ORAL | Status: DC
  Administered 2015-10-06 – 2015-10-30 (×23): 100 mg via ORAL
  Filled 2015-10-05 (×23): qty 1

## 2015-10-05 MED ORDER — PREDNISOLONE ACETATE 1 % OP SUSP
1.0000 [drp] | Freq: Every day | OPHTHALMIC | Status: DC | PRN
  Administered 2015-10-11 – 2015-10-23 (×6): 1 [drp] via OPHTHALMIC
  Filled 2015-10-05 (×3): qty 1

## 2015-10-05 MED ORDER — ALBUTEROL SULFATE (2.5 MG/3ML) 0.083% IN NEBU
2.5000 mg | INHALATION_SOLUTION | Freq: Four times a day (QID) | RESPIRATORY_TRACT | Status: DC | PRN
  Administered 2015-10-05 – 2015-10-06 (×2): 2.5 mg via RESPIRATORY_TRACT
  Filled 2015-10-05: qty 3

## 2015-10-05 MED ORDER — GABAPENTIN 400 MG PO CAPS
400.0000 mg | ORAL_CAPSULE | Freq: Two times a day (BID) | ORAL | Status: DC
  Administered 2015-10-05: 400 mg via ORAL
  Filled 2015-10-05: qty 1

## 2015-10-05 MED ORDER — FERROUS SULFATE 325 (65 FE) MG PO TABS: 325.0000 mg | ORAL_TABLET | Freq: Every day | ORAL | Status: DC

## 2015-10-05 MED ORDER — VITAMIN K1 10 MG/ML IJ SOLN
10.0000 mg | Freq: Once | INTRAVENOUS | Status: AC
  Administered 2015-10-05: 10 mg via INTRAVENOUS
  Filled 2015-10-05: qty 1

## 2015-10-05 MED ORDER — ACETAMINOPHEN 325 MG PO TABS
650.0000 mg | ORAL_TABLET | ORAL | Status: DC | PRN
  Administered 2015-10-05: 650 mg via ORAL
  Filled 2015-10-05 (×3): qty 2

## 2015-10-05 MED ORDER — SODIUM CHLORIDE 0.9% FLUSH: 3.0000 mL | INTRAVENOUS | Status: DC | PRN

## 2015-10-05 MED ORDER — WHITE PETROLATUM GEL
Status: AC
  Administered 2015-10-05: 0.2
  Filled 2015-10-05: qty 1

## 2015-10-05 MED ORDER — FUROSEMIDE 10 MG/ML IJ SOLN: 80.0000 mg | Freq: Once | INTRAMUSCULAR | Status: DC

## 2015-10-05 MED ORDER — SODIUM CHLORIDE 0.9% FLUSH
10.0000 mL | Freq: Two times a day (BID) | INTRAVENOUS | Status: DC
  Administered 2015-10-05: 10 mL

## 2015-10-05 MED ORDER — TIMOLOL MALEATE 0.5 % OP SOLN
1.0000 [drp] | Freq: Two times a day (BID) | OPHTHALMIC | Status: DC
  Administered 2015-10-05 – 2015-10-30 (×49): 1 [drp] via OPHTHALMIC
  Filled 2015-10-05 (×2): qty 5

## 2015-10-05 MED ORDER — HYDRALAZINE HCL 50 MG PO TABS
50.0000 mg | ORAL_TABLET | Freq: Three times a day (TID) | ORAL | Status: DC
  Administered 2015-10-05 – 2015-10-11 (×15): 50 mg via ORAL
  Filled 2015-10-05 (×16): qty 1

## 2015-10-05 MED ORDER — LEVOTHYROXINE SODIUM 25 MCG PO TABS
137.0000 ug | ORAL_TABLET | Freq: Every day | ORAL | Status: DC
  Administered 2015-10-08 – 2015-10-11 (×4): 137 ug via ORAL
  Filled 2015-10-05 (×6): qty 1

## 2015-10-05 MED ORDER — CLORAZEPATE DIPOTASSIUM 3.75 MG PO TABS
3.7500 mg | ORAL_TABLET | Freq: Two times a day (BID) | ORAL | Status: DC
  Administered 2015-10-05: 3.75 mg via ORAL
  Filled 2015-10-05: qty 1

## 2015-10-05 MED ORDER — SODIUM CHLORIDE 0.9 % IV SOLN
10.0000 mL/h | Freq: Once | INTRAVENOUS | Status: DC
Start: 2015-10-05 — End: 2015-10-06

## 2015-10-05 MED ORDER — ISOSORBIDE MONONITRATE ER 60 MG PO TB24
60.0000 mg | ORAL_TABLET | Freq: Every day | ORAL | Status: DC
  Administered 2015-10-07 – 2015-10-30 (×22): 60 mg via ORAL
  Filled 2015-10-05 (×5): qty 2
  Filled 2015-10-05 (×4): qty 1
  Filled 2015-10-05: qty 2
  Filled 2015-10-05 (×2): qty 1
  Filled 2015-10-05: qty 2
  Filled 2015-10-05 (×3): qty 1
  Filled 2015-10-05: qty 2
  Filled 2015-10-05 (×2): qty 1
  Filled 2015-10-05 (×2): qty 2
  Filled 2015-10-05: qty 1

## 2015-10-05 MED ORDER — CARVEDILOL 12.5 MG PO TABS: 12.5000 mg | ORAL_TABLET | Freq: Two times a day (BID) | ORAL | Status: DC

## 2015-10-05 MED ORDER — TORSEMIDE 20 MG PO TABS
40.0000 mg | ORAL_TABLET | Freq: Once | ORAL | Status: AC
  Administered 2015-10-05: 40 mg via ORAL
  Filled 2015-10-05: qty 2

## 2015-10-05 MED ORDER — SODIUM CHLORIDE 0.9 % IV SOLN
Freq: Once | INTRAVENOUS | Status: AC
Start: 2015-10-05 — End: 2015-10-05
  Administered 2015-10-05: 17:00:00 via INTRAVENOUS

## 2015-10-05 MED ORDER — TRAMADOL HCL 50 MG PO TABS
50.0000 mg | ORAL_TABLET | Freq: Two times a day (BID) | ORAL | Status: DC | PRN
  Administered 2015-10-05: 50 mg via ORAL
  Filled 2015-10-05: qty 1

## 2015-10-05 NOTE — ED Notes (Signed)
Pt. Placed on NRB  

## 2015-10-05 NOTE — Progress Notes (Signed)
Notified Dr Sherral Hammers regarding poor O2 sat readings. Instructed to assess pt mental status due to pt having poor circulation and readings in ED. Pt alert and oriented x person and place with [facility]. Husband at bedside for brief moments. Unable to obtain history and admission assessment due to pt being poor historian. Given CHG bath and MRSA PCR collected.

## 2015-10-05 NOTE — ED Notes (Signed)
Attempted report x1. 

## 2015-10-05 NOTE — Progress Notes (Signed)
Peripherally Inserted Central Catheter/Midline Placement  The IV Nurse has discussed with the patient and/or persons authorized to consent for the patient, the purpose of this procedure and the potential benefits and risks involved with this procedure.  The benefits include less needle sticks, lab draws from the catheter and patient may be discharged home with the catheter.  Risks include, but not limited to, infection, bleeding, blood clot (thrombus formation), and puncture of an artery; nerve damage and irregular heat beat.  Alternatives to this procedure were also discussed.  Consent signed by husband.  PICC/Midline Placement Documentation  PICC Double Lumen 10/05/15 PICC Brachial 41 cm 0 cm (Active)  Indication for Insertion or Continuance of Line Chronic illness with exacerbations (CF, Sickle Cell, etc.) 10/05/2015  6:21 PM  Exposed Catheter (cm) 0 cm 10/05/2015  6:21 PM  Site Assessment Clean;Dry;Intact 10/05/2015  6:21 PM  Lumen #1 Status Flushed;Saline locked;Blood return noted 10/05/2015  6:21 PM  Lumen #2 Status Flushed;Saline locked;Blood return noted 10/05/2015  6:21 PM  Dressing Type Transparent 10/05/2015  6:21 PM  Dressing Status Clean;Dry;Intact 10/05/2015  6:21 PM  Dressing Intervention New dressing 10/05/2015  6:21 PM  Dressing Change Due 10/12/15 10/05/2015  6:21 PM       Gordan Payment 10/05/2015, 6:22 PM

## 2015-10-05 NOTE — ED Notes (Signed)
Pt. BIB [facility] for evaluation of bilateral lower leg edema and weakness. Pt. Lives at home with husband and son, pt. Recently began using wheelchair due to weakness. EMS reports they were called out for episode of weakness today when patient was trying to get up from toilet. EMS reports pt. Had blue tint to lips on arrival and weeping edema to bilateral lower extremities.

## 2015-10-05 NOTE — ED Notes (Signed)
MD aware of pt. Low O2 sats despite 6L [place]

## 2015-10-05 NOTE — ED Notes (Addendum)
Admitting MD at bedside. Per MD will leave pt. On NRB at this time.

## 2015-10-05 NOTE — ED Provider Notes (Signed)
CSN: [id number]     Arrival date & time 10/05/15  1137 History   First MD Initiated Contact with Patient 10/05/15 1141     Chief Complaint  Patient presents with  . Weakness  . Leg Swelling   (Consider location/radiation/quality/duration/timing/severity/associated sxs/prior Treatment) HPI 80 y.o. female with a hx of CAD, Paroxysmal Atrial Fibrillation on Warfarin Rx, CHF, HTN, Stage III CKD, Hypothyroid, OSA and hx of Breast Cancer S/P Rt. Mastectomy who presents to the ED , presents to the Emergency Department today complaining of BLE swelling and shortness of breath. Noted increasing weakness since DC from hospital on 07-30-15 due to syncopal episode. Seen Neurology on 09-05-15 for follow up with no neurologic cause. Pt notes inability to ambulate due to weakness and called EMS as she was unable to get off of the toilet. Started using wheelchair in the past week due to weakness. Pt also notes right shoulder pain s/p mechanical fall earlier this week with no head trauma. Notes pain is 5/10 and aching. Pt has Chest tightness with SOB. No CP. No N/V/D. No headaches. No fevers. No hematochezia. No black tarry stools. No other symptoms noted.   Past Medical History  Diagnosis Date  . Anemia   . Anxiety states   . Depressive disorder, not elsewhere classified   . Type II or unspecified type diabetes mellitus without mention of complication, not stated as uncontrolled   . Hyperlipidemia   . Unspecified essential hypertension   . Renal insufficiency   . Osteoarthritis   . Gout   . Iritis   . Nonischemic cardiomyopathy (HCC)     EF 20 to 25% per echo 06/2011  . Chronic anticoagulation   . NSTEMI (non-ST elevated myocardial infarction) [facility]) Feb 2013    06/2011 cath  Mild nonobstructive disease. No LV gram  . PAF (paroxysmal atrial fibrillation) ([place])   . Obesity   . Cancer [facility]) 2010    Right breast  . DVT (deep venous thrombosis) ([place]) 2010  . Stroke [facility]) 2004    Left upper lobe  . Thyroid  disease 1969    Three fourth of Thyroid removed  . Complication of anesthesia     "fights it", for colonoscopy  . HOH (hard of hearing)     deaf in L completely  . OSA on CPAP     CPAP at night  . GERD (gastroesophageal reflux disease)   . Hypothyroidism   . Peripheral vascular disease [facility]) Jan. 2015   Past Surgical History  Procedure Laterality Date  . Breast lumpectomy    . Cataract extraction    . Foot surgery    . Tonsillectomy  1949  . Abdominal hysterectomy  1974  . Eye surgery  2008    Glaucoma shunt Right eye  . Parotid gland tumor excision  2000  . Cystectomy  1974    Left Breast, chin, left of groin area  . Thyroidectomy, partial  1969  . Endarterectomy  11/18/2011    rocedure: ENDARTERECTOMY CAROTID;  Surgeon: Conrad Darrouzett, MD;  Location: [place];  Service: Vascular;  Laterality: Left;  [name] Mastectomy  right  . Mastectomy    . Cardiac catheterization      no stent  . Colonoscopy w/ polypectomy    . Endarterectomy  01/12/2012    Procedure: ENDARTERECTOMY CAROTID;  Surgeon: Conrad [place], MD;  Location: [facility] OR;  Service: Vascular;  Laterality: Right;  Right carotid endarterectomy with 1cm x 6cm vascuguard patch angioplasty.  . Carotid  endarterectomy Left 11-18-11    cea  . Carotid endarterectomy Right 01-12-12    cea  . Left and right heart catheterization with coronary/graft angiogram Left 07/20/2011    Procedure: LEFT AND RIGHT HEART CATHETERIZATION WITH Beatrix Fetters;  Surgeon: Peter M Martinique, MD;  Location: [facility] C[facility] LAB;  Service: Cardiovascular;  Laterality: Left;   Family History  Problem Relation Age of Onset  . Hypertension    . Congestive Heart Failure Mother 45  . Breast cancer Mother   . Heart attack Mother   . Heart disease Father 70  . Heart disease Brother   . Coronary artery disease Son 40    2 Stents  . Heart attack Father   . AAA (abdominal aortic aneurysm) Brother    Social History  Substance Use Topics  . Smoking status: Former  Smoker -- 2.00 packs/day for 40 years    Types: Cigarettes    Quit date: 05/25/2002  . Smokeless tobacco: Never Used  . Alcohol Use: No   OB History    No data available     Review of Systems ROS reviewed and all are negative for acute change except as noted in the HPI.  Allergies  Amlodipine; Atorvastatin; Colesevelam; Coumadin; Lasix; Statins; and Tape  Home Medications   Prior to Admission medications   Medication Sig Start Date End Date Taking? Authorizing Provider  acetaminophen (TYLENOL) 500 MG tablet Take 500 mg by mouth daily as needed for mild pain, moderate pain or headache.     Historical Provider, MD  albuterol (PROVENTIL HFA;VENTOLIN HFA) 108 (90 BASE) MCG/ACT inhaler Inhale 2 puffs into the lungs every 6 (six) hours as needed for wheezing or shortness of breath. 09/15/12   Deneise Lever, MD  allopurinol (ZYLOPRIM) 100 MG tablet Take 1 tablet (100 mg total) by mouth daily. 07/30/15   Aleksei Plotnikov V, MD  brimonidine (ALPHAGAN) 0.15 % ophthalmic solution Place 1 drop into both eyes daily.     Historical Provider, MD  buPROPion (WELLBUTRIN SR) 150 MG 12 hr tablet Take 150 mg by mouth daily.  05/22/14   Historical Provider, MD  carvedilol (COREG) 12.5 MG tablet Take 12.5-25 mg by mouth 2 (two) times daily with a meal.     Historical Provider, MD  carvedilol (COREG) 12.5 MG tablet TAKE 2 TABLETS BY MOUTH EVERY MORNING AND 1 AT NIGHT 09/30/15   Aleksei Plotnikov V, MD  cholecalciferol (VITAMIN D) 1000 units tablet Take 1,000 Units by mouth 2 (two) times daily.    Historical Provider, MD  clorazepate (TRANXENE) 7.5 MG tablet TAKE 1 TABLET BY MOUTH 2 TIMES DAILY AS NEEDED 08/30/15   Aleksei Plotnikov V, MD  ferrous sulfate 325 (65 FE) MG tablet TAKE 1 TABLET BY MOUTH DAILY WITH BREAKFAST 09/20/15   Aleksei Plotnikov V, MD  Fluticasone-Salmeterol (ADVAIR DISKUS) 100-50 MCG/DOSE AEPB Inhale 1 puff into the lungs every 12 (twelve) hours. Discontinue all previous refills for this  medication. Hold/Fill when appropriate according to pt rx fill schedule. 01/21/15   Aleksei Plotnikov V, MD  gabapentin (NEURONTIN) 400 MG capsule Take 1 capsule (400 mg total) by mouth 2 (two) times daily. 10/01/15   Pieter Partridge, DO  hydrALAZINE (APRESOLINE) 50 MG tablet Take 1 tablet (50 mg total) by mouth 3 (three) times daily. 07/27/15   Delfina Redwood, MD  isosorbide mononitrate (IMDUR) 60 MG 24 hr tablet TAKE 1 TABLET (60 MG TOTAL) BY MOUTH DAILY. 05/21/15   Josue Hector, MD  levothyroxine (SYNTHROID,  LEVOTHROID) 137 MCG tablet TAKE 1 TABLET BY MOUTH DAILY 03/12/15   Aleksei Plotnikov V, MD  prednisoLONE acetate (PRED FORTE) 1 % ophthalmic suspension Place 1 drop into both eyes as needed (eye inflammation). For eye pain    Historical Provider, MD  predniSONE (DELTASONE) 5 MG tablet Take 1 tablet (5 mg total) by mouth daily with breakfast. Prednisone: take a total of 5 mg/d pc 04/12/15   Aleksei Plotnikov V, MD  timolol (TIMOPTIC) 0.5 % ophthalmic solution Place 1 drop into both eyes 2 (two) times daily.     Historical Provider, MD  torsemide (DEMADEX) 20 MG tablet Take 1 tablet (20 mg total) by mouth daily. 07/27/15   Delfina Redwood, MD  traMADol (ULTRAM) 50 MG tablet TAKE 1-2 TABLETS BY MOUTH 2 TIMES DAILY AS NEEDED FOR MODERATE OR SEVERE PAIN 06/28/15   Cassandria Anger, MD  warfarin (COUMADIN) 3 MG tablet TAKE AS DIRECTED BY ANTICOAGULATION CLINIC 09/30/15   Aleksei Plotnikov V, MD   BP 125/43 mmHg  Pulse 56  Temp(Src) 96.9 F (36.1 C) (Rectal)  Resp 16  Wt 101.606 kg  SpO2 82%   Physical Exam  Constitutional: She is oriented to person, place, and time. She appears well-developed and well-nourished.  HENT:  Head: Normocephalic and atraumatic.  Eyes: EOM are normal. Pupils are equal, round, and reactive to light.  Neck: Normal range of motion.  Cardiovascular: Normal rate, regular rhythm and normal heart sounds.   No murmur heard. Pulmonary/Chest: Effort normal. No respiratory  distress. She has no wheezes. She has rales in the right lower field, the left upper field and the left lower field. She exhibits no tenderness.  Abdominal: Soft. Normal appearance and bowel sounds are normal. There is tenderness in the right lower quadrant, suprapubic area and left lower quadrant.  Genitourinary: Guaiac positive stool.  Musculoskeletal: Normal range of motion.  3+ pitting edema BLE up to umbilicus. Neurovascularly intact. Distal pulses appreciated. Motor/sensation intact.   Neurological: She is alert and oriented to person, place, and time.  Skin: Skin is warm and dry.  Psychiatric: She has a normal mood and affect. Her behavior is normal. Thought content normal.  Nursing note and vitals reviewed.  Rectal Exam Chaperone was present. There are no external fissures noted. No induration of the skin or swelling. No external hemorrhoids seen. Patient able to tolerate examination. I was able to feel the first 2-3cm of the rectum digitally without gross abnormality. There is no gross blood.  NO signs of perirectal abscess.   ED Course  Procedures (including critical care time)  CRITICAL CARE Performed by: Ozella Rocks  Total critical care time: 35 minutes  Critical care time was exclusive of separately billable procedures and treating other patients.  Critical care was necessary to treat or prevent imminent or life-threatening deterioration.  Critical care was time spent personally by me on the following activities: development of treatment plan with patient and/or surrogate as well as nursing, discussions with consultants, evaluation of patient's response to treatment, examination of patient, obtaining history from patient or surrogate, ordering and performing treatments and interventions, ordering and review of laboratory studies, ordering and review of radiographic studies, pulse oximetry and re-evaluation of patient's condition.   Labs Review Labs Reviewed  CBC -  Abnormal; Notable for the following:    WBC 10.9 (*)    RBC 3.19 (*)    Hemoglobin 5.8 (*)    HCT 23.2 (*)    MCV 72.7 (*)  MCH 18.2 (*)    MCHC 25.0 (*)    RDW 19.8 (*)    All other components within normal limits  COMPREHENSIVE METABOLIC PANEL - Abnormal; Notable for the following:    CO2 16 (*)    Glucose, Bld 108 (*)    BUN 52 (*)    Creatinine, Ser 2.68 (*)    Calcium 8.4 (*)    Total Protein 6.0 (*)    Albumin 3.0 (*)    GFR calc non Af Amer 16 (*)    GFR calc Af Amer 19 (*)    All other components within normal limits  BRAIN NATRIURETIC PEPTIDE - Abnormal; Notable for the following:    B Natriuretic Peptide 1637.7 (*)    All other components within normal limits  PROTIME-INR - Abnormal; Notable for the following:    Prothrombin Time 45.8 (*)    INR 5.13 (*)    All other components within normal limits  POC OCCULT BLOOD, ED - Abnormal; Notable for the following:    Fecal Occult Bld POSITIVE (*)    All other components within normal limits  I-STAT ARTERIAL BLOOD GAS, ED - Abnormal; Notable for the following:    pCO2 arterial 28.0 (*)    Bicarbonate 16.0 (*)    Acid-base deficit 9.0 (*)    All other components within normal limits  URINE CULTURE  BLOOD GAS, ARTERIAL  URINALYSIS, ROUTINE W REFLEX MICROSCOPIC (NOT AT [facility])  I-STAT TROPOININ, ED  TYPE AND SCREEN  PREPARE RBC (CROSSMATCH)   Imaging Review Dg Chest Portable 1 View  10/05/2015  CLINICAL DATA:  Shortness of breath, weakness, bilateral leg edema EXAM: PORTABLE CHEST 1 VIEW COMPARISON:  07/23/2015 FINDINGS: Cardiomegaly is noted. Central mild vascular congestion without pulmonary edema. Stable linear atelectasis or scarring in lingula. Mild basilar atelectasis without segmental infiltrate. IMPRESSION: Cardiomegaly. Central vascular congestion without pulmonary edema. Mild basilar atelectasis. Stable linear atelectasis or scarring in lingula. Electronically Signed   By: Lahoma Crocker M.D.   On: 10/05/2015 13:30    Dg Shoulder Right Port  10/05/2015  CLINICAL DATA:  Right shoulder pain, fell at home EXAM: PORTABLE RIGHT SHOULDER - 2+ VIEW COMPARISON:  None. FINDINGS: Two views of the right shoulder submitted. No acute fracture or subluxation. Degenerative changes AC joint. Minimal spurring of humeral head IMPRESSION: No acute fracture or subluxation.  Mild degenerative changes. Electronically Signed   By: Lahoma Crocker M.D.   On: 10/05/2015 13:31   I have personally reviewed and evaluated these images and lab results as part of my medical decision-making.   EKG Interpretation None      MDM  I have reviewed and evaluated the relevant laboratory values I have reviewed and evaluated the relevant imaging studies.  I have interpreted the relevant EKG. I have reviewed the relevant previous healthcare records. I have reviewed EMS Documentation. I obtained HPI from historian. Patient discussed with supervising physician  ED Course:  Assessment: Pt is a 48yF with hx hx of CAD, Paroxysmal Atrial Fibrillation on Warfarin Rx, CHF, HTN, Stage III CKD, Hypothyroid, OSA and hx of Breast Cancer S/P Rt who presents with BLE swelling and weakness for x1-2 weeks. On exam, pt in NAD. Nontoxic/nonseptic appearing. VS Pulse- 56, BP- 127/65, O2 Sat between 80-100% with no accessory muscle usage. Currently 92% on 6LNC. Does not use O2 at home. Afebrile. Lungs with bilateral rales. Heart RRR. Abdomen TTP lower quadrants. Pitting edema 3+ to umbilicus. Hgb- 5.8. Type and screen ordered. Guiaic Positive.  Given Protonix. Stool not melanotic. Rectal exam unremarkable. PT 45.8. INR 5.8. Vitamin K given. Torsemide 40mg  given for edema and vascular congestion on CXR. Trop neg. Plan is to admit to step down. Consult GI (Dr. Paulita Fujita).    Disposition/Plan:  Admit to Step Down Pt acknowledges and agrees with plan  Supervising Physician Virgel Manifold, MD   Final diagnoses:  Gastrointestinal hemorrhage, unspecified gastritis, unspecified  gastrointestinal hemorrhage type  Anemia, unspecified anemia type        Shary Decamp, PA-C 10/05/15 1420  Virgel Manifold, MD 10/09/15 2350

## 2015-10-05 NOTE — H&P (Signed)
[facility] History and Physical  Wendy Cole [id number] DOB: November 10, 1935 DOA: 10/05/2015  Referring physician:  PCP: Walker Kehr, MD   Chief Complaint: weakness/fatigue  HPI: Wendy Cole is a 80 y.o. WF PMHx  Anxiety, Depression,CVA, CAD native artery, Paroxysmal Atrial Fibrillation on Warfarin Rx, Chronic Diastolic CHF/Cardiomyopathy, NSTEMI, HTN, Stage III CKD, Hypothyroid, OSA and hx of Breast Cancer S/P Rt. Mastectomy HLD, DVT,   Who presents to the ED , presents to the Emergency Department today complaining of BLE swelling and shortness of breath. Noted increasing weakness since DC from hospital on 07-30-15 due to syncopal episode. Seen Neurology on 09-05-15 for follow up with no neurologic cause. Pt notes inability to ambulate due to weakness and called EMS as she was unable to get off of the toilet. Started using wheelchair in the past week due to weakness. Pt also notes right shoulder pain s/p mechanical fall earlier this week with no head trauma. Notes pain is 5/10 and aching. Pt has Chest tightness with SOB. No CP. No N/V/D. No headaches. No fevers. No hematochezia. No black tarry stools. No other symptoms noted.  Occult blood positive In ED. Patient has not noticed blood in urine or  Stool.states allergic to Lasix will cause her extreme muscle spasms ~  30-45 minutes post administration..states baseline weight is ~218 pounds(99 kg). States does not weigh herself daily    Review of Systems:  Constitutional:  No weight loss, night sweats, Fevers, chills, fatigue.  HEENT:  No headaches, Difficulty swallowing,Tooth/dental problems,Sore throat,  No sneezing, itching, ear ache, nasal congestion, post nasal drip,  Cardio-vascular:  No chest pain, Orthopnea, PND, swelling in lower extremities, anasarca, dizziness, palpitations  GI:  No heartburn, indigestion, abdominal pain, nausea, vomiting, diarrhea, change in bowel habits, loss of appetite  Resp:  No  shortness of breath with exertion or at rest. No excess mucus, no productive cough, No non-productive cough, No coughing up of blood.No change in color of mucus.No wheezing.No chest wall deformity  Skin:  no rash or lesions.  GU:  no dysuria, change in color of urine, no urgency or frequency. No flank pain.  Musculoskeletal:  No joint pain or swelling. No decreased range of motion. No back pain.  Psych:  No change in mood or affect. No depression or anxiety. No memory loss.   Past Medical History  Diagnosis Date  . Anemia   . Anxiety states   . Depressive disorder, not elsewhere classified   . Type II or unspecified type diabetes mellitus without mention of complication, not stated as uncontrolled   . Hyperlipidemia   . Unspecified essential hypertension   . Renal insufficiency   . Osteoarthritis   . Gout   . Iritis   . Nonischemic cardiomyopathy (HCC)     EF 20 to 25% per echo 06/2011  . Chronic anticoagulation   . NSTEMI (non-ST elevated myocardial infarction) [facility]) Feb 2013    06/2011 cath  Mild nonobstructive disease. No LV gram  . PAF (paroxysmal atrial fibrillation) ([place])   . Obesity   . Cancer [facility]) 2010    Right breast  . DVT (deep venous thrombosis) ([place]) 2010  . Stroke [facility]) 2004    Left upper lobe  . Thyroid disease 1969    Three fourth of Thyroid removed  . Complication of anesthesia     "fights it", for colonoscopy  . HOH (hard of hearing)     deaf in L completely  . OSA on CPAP  CPAP at night  . GERD (gastroesophageal reflux disease)   . Hypothyroidism   . Peripheral vascular disease [facility]) Jan. 2015   Past Surgical History  Procedure Laterality Date  . Breast lumpectomy    . Cataract extraction    . Foot surgery    . Tonsillectomy  1949  . Abdominal hysterectomy  1974  . Eye surgery  2008    Glaucoma shunt Right eye  . Parotid gland tumor excision  2000  . Cystectomy  1974    Left Breast, chin, left of groin area  . Thyroidectomy, partial  1969   . Endarterectomy  11/18/2011    rocedure: ENDARTERECTOMY CAROTID;  Surgeon: Conrad Copperton, MD;  Location: [place];  Service: Vascular;  Laterality: Left;  [name] Mastectomy  right  . Mastectomy    . Cardiac catheterization      no stent  . Colonoscopy w/ polypectomy    . Endarterectomy  01/12/2012    Procedure: ENDARTERECTOMY CAROTID;  Surgeon: Conrad Wiley Ford, MD;  Location: [facility] OR;  Service: Vascular;  Laterality: Right;  Right carotid endarterectomy with 1cm x 6cm vascuguard patch angioplasty.  . Carotid endarterectomy Left 11-18-11    cea  . Carotid endarterectomy Right 01-12-12    cea  . Left and right heart catheterization with coronary/graft angiogram Left 07/20/2011    Procedure: LEFT AND RIGHT HEART CATHETERIZATION WITH Beatrix Fetters;  Surgeon: Peter M Martinique, MD;  Location[facility] C[facility] LAB;  Service: Cardiovascular;  Laterality: Left;   Social History:  reports that she quit smoking about 13 years ago. Her smoking use included Cigarettes. She has a 80 pack-year smoking history. She has never used smokeless tobacco. She reports that she does not drink alcohol or use illicit drugs.  Allergies  Allergen Reactions  . Amlodipine     edema  . Atorvastatin Other (See Comments)    Muscle aches  . Colesevelam Other (See Comments)    unknown  . Coumadin [Warfarin Sodium]     "causes cramps" reaction to brand name only per pt  . Lasix [Furosemide] Other (See Comments)    Muscle cramps  . Statins Other (See Comments)    Muscle aches  . Tape Rash    Family History  Problem Relation Age of Onset  . Hypertension    . Congestive Heart Failure Mother 51  . Breast cancer Mother   . Heart attack Mother   . Heart disease Father 41  . Heart disease Brother   . Coronary artery disease Son 40    2 Stents  . Heart attack Father   . AAA (abdominal aortic aneurysm) Brother      Prior to Admission medications   Medication Sig Start Date End Date Taking? Authorizing Provider  acetaminophen  (TYLENOL) 500 MG tablet Take 500 mg by mouth daily as needed for mild pain, moderate pain or headache.    Yes Historical Provider, MD  albuterol (PROVENTIL HFA;VENTOLIN HFA) 108 (90 BASE) MCG/ACT inhaler Inhale 2 puffs into the lungs every 6 (six) hours as needed for wheezing or shortness of breath. 09/15/12  Yes Deneise Lever, MD  allopurinol (ZYLOPRIM) 100 MG tablet Take 1 tablet (100 mg total) by mouth daily. 07/30/15  Yes Aleksei Plotnikov V, MD  brimonidine (ALPHAGAN) 0.15 % ophthalmic solution Place 1 drop into both eyes daily.    Yes Historical Provider, MD  buPROPion (WELLBUTRIN SR) 150 MG 12 hr tablet Take 150 mg by mouth daily.  05/22/14  Yes Historical Provider,  MD  carvedilol (COREG) 12.5 MG tablet Take 12.5-25 mg by mouth 2 (two) times daily with a meal.    Yes Historical Provider, MD  cholecalciferol (VITAMIN D) 1000 units tablet Take 1,000 Units by mouth 2 (two) times daily.   Yes Historical Provider, MD  clorazepate (TRANXENE) 7.5 MG tablet TAKE 1 TABLET BY MOUTH 2 TIMES DAILY AS NEEDED 08/30/15  Yes Aleksei Plotnikov V, MD  ferrous sulfate 325 (65 FE) MG tablet TAKE 1 TABLET BY MOUTH DAILY WITH BREAKFAST 09/20/15  Yes Aleksei Plotnikov V, MD  Fluticasone-Salmeterol (ADVAIR DISKUS) 100-50 MCG/DOSE AEPB Inhale 1 puff into the lungs every 12 (twelve) hours. Discontinue all previous refills for this medication. Hold/Fill when appropriate according to pt rx fill schedule. 01/21/15  Yes Aleksei Plotnikov V, MD  gabapentin (NEURONTIN) 400 MG capsule Take 1 capsule (400 mg total) by mouth 2 (two) times daily. 10/01/15  Yes Adam Telford Nab, DO  hydrALAZINE (APRESOLINE) 50 MG tablet Take 1 tablet (50 mg total) by mouth 3 (three) times daily. 07/27/15  Yes Delfina Redwood, MD  isosorbide mononitrate (IMDUR) 60 MG 24 hr tablet TAKE 1 TABLET (60 MG TOTAL) BY MOUTH DAILY. 05/21/15  Yes Josue Hector, MD  levothyroxine (SYNTHROID, LEVOTHROID) 137 MCG tablet TAKE 1 TABLET BY MOUTH DAILY 03/12/15  Yes Aleksei  Plotnikov V, MD  prednisoLONE acetate (PRED FORTE) 1 % ophthalmic suspension Place 1 drop into both eyes as needed (eye inflammation). For eye pain   Yes Historical Provider, MD  predniSONE (DELTASONE) 5 MG tablet Take 1 tablet (5 mg total) by mouth daily with breakfast. Prednisone: take a total of 5 mg/d pc 04/12/15  Yes Aleksei Plotnikov V, MD  timolol (TIMOPTIC) 0.5 % ophthalmic solution Place 1 drop into both eyes 2 (two) times daily.    Yes Historical Provider, MD  torsemide (DEMADEX) 20 MG tablet Take 1 tablet (20 mg total) by mouth daily. 07/27/15  Yes Delfina Redwood, MD  traMADol (ULTRAM) 50 MG tablet TAKE 1-2 TABLETS BY MOUTH 2 TIMES DAILY AS NEEDED FOR MODERATE OR SEVERE PAIN 06/28/15  Yes Aleksei Plotnikov V, MD  warfarin (COUMADIN) 3 MG tablet TAKE AS DIRECTED BY ANTICOAGULATION CLINIC 09/30/15  Yes Cassandria Anger, MD     Consultants:  Dr. Paulita Fujita  GI  Procedures/Significant Events:  3/7 echocardiogram; Left ventricle: mild LVH.- LVEF= 55% to 60%. -(grade 2 diastolic dysfunction). - Left atrium: moderately dilated.- Right ventricle: moderately dilated.- Right atrium: moderately dilated. 5/13 transfuse 3 units  PRBC    Cultures NA  Antimicrobials:  NA  Devices NA   LINES / TUBES:  NA    Continuous Infusions:    Physical Exam: Filed Vitals:   10/05/15 1609 10/05/15 1620 10/05/15 1716 10/05/15 1845  BP:   148/76 130/70  Pulse: 57 57 60 59  Temp: 96.8 F (36 C) 97 F (36.1 C)  97.8 F (36.6 C)  TempSrc: Axillary Axillary  Axillary  Resp: 16 16  17   Height: 5\' 6"  (1.676 m)     Weight: 101.606 kg (224 lb)     SpO2: 94% 82%  90%    Wt Readings from Last 3 Encounters:  10/05/15 101.606 kg (224 lb)  07/27/15 93.128 kg (205 lb 5 oz)  07/22/15 101.152 kg (223 lb)    General: A/O 4, positive acute respiratory distress Eyes: negative scleral hemorrhage, negative anisocoria, negative icterus ENT: Negative Runny nose, negative gingival bleeding, Neck:   Negative scars, masses, torticollis, lymphadenopathy, JVD Lungs: Clear to  auscultation bilaterally without wheezes or crackles Cardiovascular: Regular rate and rhythm without murmur gallop or rub normal S1 and S2 Abdomen: Morbidly obese, negative abdominal pain, nondistended, positive soft, bowel sounds, no rebound, no ascites, no appreciable mass Extremities: bilateral edema to hips  Skin: Negative rashes, lesions, ulcers Psychiatric:  Negative depression, negative anxiety, negative fatigue, negative mania  Central nervous system:  Cranial nerves II through XII intact, tongue/uvula midline, all extremities muscle strength 5/5, sensation intact throughout, negative dysarthria, negative expressive aphasia, negative receptive aphasia.        Labs on Admission:  Basic Metabolic Panel:  Recent Labs Lab 10/05/15 1150 10/05/15 1538  NA 135  --   K 4.7  --   CL 109  --   CO2 16*  --   GLUCOSE 108*  --   BUN 52*  --   CREATININE 2.68*  --   CALCIUM 8.4*  --   MG  --  1.7   Liver Function Tests:  Recent Labs Lab 10/05/15 1150  AST 18  ALT 14  ALKPHOS 79  BILITOT 0.9  PROT 6.0*  ALBUMIN 3.0*   No results for input(s): LIPASE, AMYLASE in the last 168 hours. No results for input(s): AMMONIA in the last 168 hours. CBC:  Recent Labs Lab 10/05/15 1150  WBC 10.9*  HGB 5.8*  HCT 23.2*  MCV 72.7*  PLT 302   Cardiac Enzymes:  Recent Labs Lab 10/05/15 1538  TROPONINI 0.06*    BNP (last 3 results)  Recent Labs  07/23/15 1912 10/05/15 1150  BNP 2008.1* 1637.7*    ProBNP (last 3 results)  Recent Labs  07/22/15 1503  PROBNP >5000.0*    CBG: No results for input(s): GLUCAP in the last 168 hours.  Radiological Exams on Admission: Dg Chest Portable 1 View  10/05/2015  CLINICAL DATA:  Shortness of breath, weakness, bilateral leg edema EXAM: PORTABLE CHEST 1 VIEW COMPARISON:  07/23/2015 FINDINGS: Cardiomegaly is noted. Central mild vascular congestion without  pulmonary edema. Stable linear atelectasis or scarring in lingula. Mild basilar atelectasis without segmental infiltrate. IMPRESSION: Cardiomegaly. Central vascular congestion without pulmonary edema. Mild basilar atelectasis. Stable linear atelectasis or scarring in lingula. Electronically Signed   By: Lahoma Crocker M.D.   On: 10/05/2015 13:30   Dg Shoulder Right Port  10/05/2015  CLINICAL DATA:  Right shoulder pain, fell at home EXAM: PORTABLE RIGHT SHOULDER - 2+ VIEW COMPARISON:  None. FINDINGS: Two views of the right shoulder submitted. No acute fracture or subluxation. Degenerative changes AC joint. Minimal spurring of humeral head IMPRESSION: No acute fracture or subluxation.  Mild degenerative changes. Electronically Signed   By: Lahoma Crocker M.D.   On: 10/05/2015 13:31    EKG: Sinus bradycardia  Assessment/Plan Active Problems:   Essential hypertension   History of DVT (deep vein thrombosis)   OSA on CPAP   Anemia   CKD (chronic kidney disease) stage 3, GFR 30-59 ml/min   Stroke (HCC)   Chronic anticoagulation   Hypothyroidism   Chronic venous embolism and thrombosis of deep vessels of lower extremity (HCC)   Supratherapeutic INR   Lower GI bleed   Diabetes mellitus type 2, uncontrolled, with complications (HCC)   Anxiety   Depression   CAD in native artery   Paroxysmal atrial fibrillation (HCC)   Chronic diastolic CHF (congestive heart failure) (HCC)   Cardiomyopathy (HCC)   HLD (hyperlipidemia)   CHF, acute on chronic (HCC)   Bleeding gastrointestinal  Lower GI bleed -On admissions patient's  hemoglobin 5.8, positive occult blood -5/13 transfuse 3 units  PRBC -GI consulted; awaiting recommendations  Supratherapeutic INR -Vitamin K 10 mg 1 -1 daily  Chronic diastolic CHF/Cardiomyopathy -troponin 3 -Echocardiogram pending -patient is extremely fluid overloaded (edematous to hips) -strict in and out -Daily weight Filed Weights   10/05/15 1238 10/05/15 1609  Weight:  101.606 kg (224 lb) 101.606 kg (224 lb)  -Bumex 2 mg IV TID  Paroxysmal Atrial fibrillation -currently in NSR; decreased Coreg secondary to borderline sinus bradycardia -Coumadin on hold -Spratherapeutic INR  OSA -CPAP per respiratory therapy  DM type 2 Uncontrolled with complications -123456 pending -Lipid Panel pending  HLD -See diabetes  CKD stage III (Baseline~1.82-2.12) -See chronic systolic CHF  Hypothyroidism -Continue Synthroid 137 g daily -TSH pending  DVT -Patient had been on Coumadin unfortunately with GI bleed will hold  Anxiety -home dose Clorazepate 7.5 mg BID; will decrease to 3.75 BID  Depression -Continue Wellbutrin 150 mg daily      Code Status: Full  (DVT Prophylaxis: SCD Family Communication: Husband present  Disposition Plan: Resolution of fluid overload and lower GI bleed   Data Reviewed: Care during the described time interval was provided by me .  I have reviewed this patient's available data, including medical history, events of note, physical examination, and all test results as part of my evaluation. I have personally reviewed and interpreted all radiology studies.  Time spent: 60 min  [place], [facility] Pager [phone]

## 2015-10-06 ENCOUNTER — Other Ambulatory Visit (HOSPITAL_COMMUNITY): Payer: Medicare Other

## 2015-10-06 ENCOUNTER — Inpatient Hospital Stay (HOSPITAL_COMMUNITY): Payer: Medicare Other

## 2015-10-06 DIAGNOSIS — I509 Heart failure, unspecified: Secondary | ICD-10-CM

## 2015-10-06 LAB — BLOOD GAS, ARTERIAL
Acid-base deficit: 9.1 mmol/L — ABNORMAL HIGH (ref 0.0–2.0)
Bicarbonate: 15.6 mEq/L — ABNORMAL LOW (ref 20.0–24.0)
DRAWN BY: 105521
Expiratory PAP: 5
FIO2: 0.4
Inspiratory PAP: 10
MODE: POSITIVE
O2 SAT: 94.5 %
PH ART: 7.316 — AB (ref 7.350–7.450)
Patient temperature: 101.6
RATE: 15 resp/min
TCO2: 16.5 mmol/L (ref 0–100)
pCO2 arterial: 32.1 mmHg — ABNORMAL LOW (ref 35.0–45.0)
pO2, Arterial: 83.1 mmHg (ref 80.0–100.0)

## 2015-10-06 LAB — COMPREHENSIVE METABOLIC PANEL
ALBUMIN: 3.2 g/dL — AB (ref 3.5–5.0)
ALT: 17 U/L (ref 14–54)
ANION GAP: 15 (ref 5–15)
AST: 21 U/L (ref 15–41)
Alkaline Phosphatase: 91 U/L (ref 38–126)
BILIRUBIN TOTAL: 2.7 mg/dL — AB (ref 0.3–1.2)
BUN: 55 mg/dL — ABNORMAL HIGH (ref 6–20)
CHLORIDE: 109 mmol/L (ref 101–111)
CO2: 16 mmol/L — ABNORMAL LOW (ref 22–32)
Calcium: 8.7 mg/dL — ABNORMAL LOW (ref 8.9–10.3)
Creatinine, Ser: 2.66 mg/dL — ABNORMAL HIGH (ref 0.44–1.00)
GFR calc Af Amer: 19 mL/min — ABNORMAL LOW (ref >60)
GFR calc non Af Amer: 16 mL/min — ABNORMAL LOW (ref >60)
GLUCOSE: 86 mg/dL (ref 65–99)
POTASSIUM: 4.9 mmol/L (ref 3.5–5.1)
SODIUM: 140 mmol/L (ref 135–145)
TOTAL PROTEIN: 6.3 g/dL — AB (ref 6.5–8.1)

## 2015-10-06 LAB — ECHOCARDIOGRAM COMPLETE
Height: 66 in
Weight: 3766.4 oz

## 2015-10-06 LAB — URINE CULTURE: Culture: >=100000 — AB

## 2015-10-06 LAB — CBC WITH DIFFERENTIAL/PLATELET
BASOS ABS: 0.1 10*3/uL (ref 0.0–0.1)
Basophils Relative: 1 %
EOS ABS: 0.3 10*3/uL (ref 0.0–0.7)
Eosinophils Relative: 3 %
HEMATOCRIT: 33.4 % — AB (ref 36.0–46.0)
Hemoglobin: 9.8 g/dL — ABNORMAL LOW (ref 12.0–15.0)
LYMPHS ABS: 0.1 10*3/uL — AB (ref 0.7–4.0)
Lymphocytes Relative: 1 %
MCH: 22.2 pg — ABNORMAL LOW (ref 26.0–34.0)
MCHC: 29.3 g/dL — AB (ref 30.0–36.0)
MCV: 75.7 fL — ABNORMAL LOW (ref 78.0–100.0)
MONO ABS: 0.5 10*3/uL (ref 0.1–1.0)
Monocytes Relative: 5 %
NEUTROS ABS: 9.6 10*3/uL — AB (ref 1.7–7.7)
Neutrophils Relative %: 90 %
PLATELETS: 279 10*3/uL (ref 150–400)
RBC: 4.41 MIL/uL (ref 3.87–5.11)
RDW: 19.6 % — AB (ref 11.5–15.5)
WBC: 10.6 10*3/uL — AB (ref 4.0–10.5)

## 2015-10-06 LAB — TROPONIN I: TROPONIN I: 0.03 ng/mL (ref <0.031)

## 2015-10-06 LAB — CBC
HEMATOCRIT: 28.8 % — AB (ref 36.0–46.0)
Hemoglobin: 8.2 g/dL — ABNORMAL LOW (ref 12.0–15.0)
MCH: 21.3 pg — ABNORMAL LOW (ref 26.0–34.0)
MCHC: 28.5 g/dL — ABNORMAL LOW (ref 30.0–36.0)
MCV: 74.8 fL — ABNORMAL LOW (ref 78.0–100.0)
Platelets: 265 10*3/uL (ref 150–400)
RBC: 3.85 MIL/uL — ABNORMAL LOW (ref 3.87–5.11)
RDW: 19.7 % — AB (ref 11.5–15.5)
WBC: 10 10*3/uL (ref 4.0–10.5)

## 2015-10-06 LAB — GLUCOSE, CAPILLARY
GLUCOSE-CAPILLARY: 90 mg/dL (ref 65–99)
GLUCOSE-CAPILLARY: 149 mg/dL — AB (ref 65–99)
Glucose-Capillary: 80 mg/dL (ref 65–99)
Glucose-Capillary: 116 mg/dL — ABNORMAL HIGH (ref 65–99)

## 2015-10-06 LAB — PROTIME-INR
INR: 1.48 (ref 0.00–1.49)
Prothrombin Time: 18 seconds — ABNORMAL HIGH (ref 11.6–15.2)

## 2015-10-06 LAB — MAGNESIUM: Magnesium: 1.6 mg/dL — ABNORMAL LOW (ref 1.7–2.4)

## 2015-10-06 MED ORDER — BUMETANIDE 0.25 MG/ML IJ SOLN
2.0000 mg | Freq: Three times a day (TID) | INTRAMUSCULAR | Status: DC
  Administered 2015-10-06 – 2015-10-12 (×21): 2 mg via INTRAVENOUS
  Filled 2015-10-06 (×22): qty 8

## 2015-10-06 MED ORDER — FUROSEMIDE 10 MG/ML IJ SOLN: 40.0000 mg | Freq: Once | INTRAMUSCULAR | Status: DC

## 2015-10-06 MED ORDER — ALBUTEROL SULFATE (2.5 MG/3ML) 0.083% IN NEBU
2.5000 mg | INHALATION_SOLUTION | RESPIRATORY_TRACT | Status: DC | PRN
  Administered 2015-10-06: 2.5 mg via RESPIRATORY_TRACT
  Filled 2015-10-06: qty 3

## 2015-10-06 MED ORDER — BUMETANIDE 0.25 MG/ML IJ SOLN: 2.0000 mg | INTRAMUSCULAR | Status: DC

## 2015-10-06 MED ORDER — PANTOPRAZOLE SODIUM 40 MG IV SOLR
40.0000 mg | Freq: Two times a day (BID) | INTRAVENOUS | Status: DC
  Administered 2015-10-06 – 2015-10-07 (×3): 40 mg via INTRAVENOUS
  Filled 2015-10-06 (×3): qty 40

## 2015-10-06 MED ORDER — BUMETANIDE 0.25 MG/ML IJ SOLN: 2.0000 mg | Freq: Every day | INTRAMUSCULAR | Status: DC

## 2015-10-06 MED ORDER — PERFLUTREN LIPID MICROSPHERE
INTRAVENOUS | Status: AC
  Administered 2015-10-06: 10:00:00
  Filled 2015-10-06: qty 10

## 2015-10-06 MED ORDER — ACETAMINOPHEN 650 MG RE SUPP
650.0000 mg | RECTAL | Status: DC | PRN
  Administered 2015-10-06 – 2015-10-14 (×2): 650 mg via RECTAL
  Filled 2015-10-06 (×2): qty 1

## 2015-10-06 MED ORDER — SODIUM CHLORIDE 0.9 % IV SOLN: Freq: Once | INTRAVENOUS | Status: DC

## 2015-10-06 MED ORDER — HYDROCORTISONE NA SUCCINATE PF 100 MG IJ SOLR
50.0000 mg | Freq: Two times a day (BID) | INTRAMUSCULAR | Status: AC
  Administered 2015-10-06 – 2015-10-07 (×4): 50 mg via INTRAVENOUS
  Filled 2015-10-06 (×4): qty 2

## 2015-10-06 MED ORDER — BRIMONIDINE TARTRATE 0.2 % OP SOLN
1.0000 [drp] | Freq: Two times a day (BID) | OPHTHALMIC | Status: DC
  Administered 2015-10-06 – 2015-10-30 (×48): 1 [drp] via OPHTHALMIC
  Filled 2015-10-06 (×2): qty 5

## 2015-10-06 MED ORDER — SODIUM CHLORIDE 0.45 % IV SOLN
INTRAVENOUS | Status: DC
  Administered 2015-10-06 – 2015-10-24 (×9): via INTRAVENOUS

## 2015-10-06 MED ORDER — BUMETANIDE 0.25 MG/ML IJ SOLN
2.0000 mg | Freq: Once | INTRAMUSCULAR | Status: DC
  Filled 2015-10-06: qty 8

## 2015-10-06 MED ORDER — CHLORHEXIDINE GLUCONATE 0.12 % MT SOLN
15.0000 mL | Freq: Two times a day (BID) | OROMUCOSAL | Status: DC
  Administered 2015-10-06 – 2015-10-30 (×44): 15 mL via OROMUCOSAL
  Filled 2015-10-06 (×23): qty 15

## 2015-10-06 MED ORDER — HYDRALAZINE HCL 20 MG/ML IJ SOLN
5.0000 mg | INTRAMUSCULAR | Status: DC | PRN
  Administered 2015-10-08 – 2015-10-13 (×6): 5 mg via INTRAVENOUS
  Filled 2015-10-06 (×7): qty 1

## 2015-10-06 MED ORDER — MAGNESIUM SULFATE 2 GM/50ML IV SOLN
2.0000 g | Freq: Once | INTRAVENOUS | Status: AC
  Administered 2015-10-06: 2 g via INTRAVENOUS
  Filled 2015-10-06: qty 50

## 2015-10-06 MED ORDER — ACETAMINOPHEN 325 MG PO TABS
650.0000 mg | ORAL_TABLET | ORAL | Status: DC | PRN
  Administered 2015-10-06 – 2015-10-30 (×15): 650 mg via ORAL
  Filled 2015-10-06 (×14): qty 2

## 2015-10-06 MED ORDER — DEXTROSE 5 % IV SOLN
1.0000 g | INTRAVENOUS | Status: AC
  Administered 2015-10-06 – 2015-10-11 (×6): 1 g via INTRAVENOUS
  Filled 2015-10-06 (×6): qty 10

## 2015-10-06 MED ORDER — CETYLPYRIDINIUM CHLORIDE 0.05 % MT LIQD
7.0000 mL | Freq: Two times a day (BID) | OROMUCOSAL | Status: DC
  Administered 2015-10-06 – 2015-10-30 (×34): 7 mL via OROMUCOSAL

## 2015-10-06 NOTE — Consult Note (Signed)
[facility] Gastroenterology Consultation Note  Referring Provider: Dr. Joette Catching [facility]) Primary Care Physician:  Walker Kehr, MD Primary Gastroenterologist:  Dr. Teena Irani  Reason for Consultation:  Anemia, hemoccult-positive stool  HPI: Wendy Cole is a 80 y.o. female whom we've been asked to see for anemia and hemoccult-positive stool.  No reported abdominal pain.  No reported overt GI bleeding (no melena, hematemesis, hematochezia).  Had interval decrease in Hgb.  Had INR 5, on warfarin for atrial fibrillation.  Colonoscopy October 2014 with Dr. Amedeo Plenty, results as below.  Patient is in respiratory distress at present, unable to answer many questions, is on BiPap.   Past Medical History  Diagnosis Date  . Anemia   . Anxiety states   . Depressive disorder, not elsewhere classified   . Type II or unspecified type diabetes mellitus without mention of complication, not stated as uncontrolled   . Hyperlipidemia   . Unspecified essential hypertension   . Renal insufficiency   . Osteoarthritis   . Gout   . Iritis   . Nonischemic cardiomyopathy (HCC)     EF 20 to 25% per echo 06/2011  . Chronic anticoagulation   . NSTEMI (non-ST elevated myocardial infarction) [facility]) Feb 2013    06/2011 cath  Mild nonobstructive disease. No LV gram  . PAF (paroxysmal atrial fibrillation) ([place])   . Obesity   . Cancer [facility]) 2010    Right breast  . DVT (deep venous thrombosis) ([place]) 2010  . Stroke [facility]) 2004    Left upper lobe  . Thyroid disease 1969    Three fourth of Thyroid removed  . Complication of anesthesia     "fights it", for colonoscopy  . HOH (hard of hearing)     deaf in L completely  . OSA on CPAP     CPAP at night  . GERD (gastroesophageal reflux disease)   . Hypothyroidism   . Peripheral vascular disease [facility]) Jan. 2015    Past Surgical History  Procedure Laterality Date  . Breast lumpectomy    . Cataract extraction    . Foot surgery    . Tonsillectomy  1949  .  Abdominal hysterectomy  1974  . Eye surgery  2008    Glaucoma shunt Right eye  . Parotid gland tumor excision  2000  . Cystectomy  1974    Left Breast, chin, left of groin area  . Thyroidectomy, partial  1969  . Endarterectomy  11/18/2011    rocedure: ENDARTERECTOMY CAROTID;  Surgeon: Conrad New Athens, MD;  Location: [place];  Service: Vascular;  Laterality: Left;  [name] Mastectomy  right  . Mastectomy    . Cardiac catheterization      no stent  . Colonoscopy w/ polypectomy    . Endarterectomy  01/12/2012    Procedure: ENDARTERECTOMY CAROTID;  Surgeon: Conrad Bluffton, MD;  Location: [facility] OR;  Service: Vascular;  Laterality: Right;  Right carotid endarterectomy with 1cm x 6cm vascuguard patch angioplasty.  . Carotid endarterectomy Left 11-18-11    cea  . Carotid endarterectomy Right 01-12-12    cea  . Left and right heart catheterization with coronary/graft angiogram Left 07/20/2011    Procedure: LEFT AND RIGHT HEART CATHETERIZATION WITH Beatrix Fetters;  Surgeon: Peter M Martinique, MD;  Location: [facility] CATH LAB;  Service: Cardiovascular;  Laterality: Left;    Prior to Admission medications   Medication Sig Start Date End Date Taking? Authorizing Provider  acetaminophen (TYLENOL) 500 MG tablet Take 500 mg by mouth daily  as needed for mild pain, moderate pain or headache.    Yes Historical Provider, MD  albuterol (PROVENTIL HFA;VENTOLIN HFA) 108 (90 BASE) MCG/ACT inhaler Inhale 2 puffs into the lungs every 6 (six) hours as needed for wheezing or shortness of breath. 09/15/12  Yes Deneise Lever, MD  allopurinol (ZYLOPRIM) 100 MG tablet Take 1 tablet (100 mg total) by mouth daily. 07/30/15  Yes Aleksei Plotnikov V, MD  brimonidine (ALPHAGAN) 0.2 % ophthalmic solution Place 1 drop into both eyes 2 (two) times daily. 07/25/15  Yes Historical Provider, MD  buPROPion (WELLBUTRIN SR) 150 MG 12 hr tablet Take 150 mg by mouth daily.  05/22/14  Yes Historical Provider, MD  carvedilol (COREG) 12.5 MG tablet Take  12.5-25 mg by mouth 2 (two) times daily with a meal. Take 2 tablets (25 mg) by mouth every morning and 1 tablet (12.5 mg) at night   Yes Historical Provider, MD  cholecalciferol (VITAMIN D) 1000 units tablet Take 1,000 Units by mouth 2 (two) times daily.   Yes Historical Provider, MD  clorazepate (TRANXENE) 7.5 MG tablet TAKE 1 TABLET BY MOUTH 2 TIMES DAILY AS NEEDED Patient taking differently: TAKE 1 TABLET BY MOUTH 2 TIMES DAILY AS NEEDED FOR ANXIETY 08/30/15  Yes Aleksei Plotnikov V, MD  ferrous sulfate 325 (65 FE) MG tablet TAKE 1 TABLET BY MOUTH DAILY WITH BREAKFAST 09/20/15  Yes Aleksei Plotnikov V, MD  Fluticasone-Salmeterol (ADVAIR DISKUS) 100-50 MCG/DOSE AEPB Inhale 1 puff into the lungs every 12 (twelve) hours. Discontinue all previous refills for this medication. Hold/Fill when appropriate according to pt rx fill schedule. 01/21/15  Yes Aleksei Plotnikov V, MD  gabapentin (NEURONTIN) 400 MG capsule Take 1 capsule (400 mg total) by mouth 2 (two) times daily. 10/01/15  Yes Adam Telford Nab, DO  hydrALAZINE (APRESOLINE) 50 MG tablet Take 1 tablet (50 mg total) by mouth 3 (three) times daily. 07/27/15  Yes Delfina Redwood, MD  isosorbide mononitrate (IMDUR) 60 MG 24 hr tablet TAKE 1 TABLET (60 MG TOTAL) BY MOUTH DAILY. 05/21/15  Yes Josue Hector, MD  levothyroxine (SYNTHROID, LEVOTHROID) 137 MCG tablet TAKE 1 TABLET BY MOUTH DAILY 03/12/15  Yes Aleksei Plotnikov V, MD  metolazone (ZAROXOLYN) 2.5 MG tablet Take 2.5 mg by mouth daily. 09/20/15  Yes Historical Provider, MD  prednisoLONE acetate (PRED FORTE) 1 % ophthalmic suspension Place 1 drop into both eyes every 4 (four) hours as needed (eye inflammation/pain).    Yes Historical Provider, MD  predniSONE (DELTASONE) 5 MG tablet Take 1 tablet (5 mg total) by mouth daily with breakfast. Prednisone: take a total of 5 mg/d pc Patient taking differently: Take 5 mg by mouth daily with breakfast.  04/12/15  Yes Cassandria Anger, MD  [place] into the lungs at bedtime. CPAP - 10   Yes Historical Provider, MD  timolol (TIMOPTIC) 0.5 % ophthalmic solution Place 1 drop into both eyes 2 (two) times daily.    Yes Historical Provider, MD  torsemide (DEMADEX) 20 MG tablet Take 1 tablet (20 mg total) by mouth daily. 07/27/15  Yes Delfina Redwood, MD  traMADol (ULTRAM) 50 MG tablet TAKE 1-2 TABLETS BY MOUTH 2 TIMES DAILY AS NEEDED FOR MODERATE OR SEVERE PAIN 06/28/15  Yes Aleksei Plotnikov V, MD  warfarin (COUMADIN) 3 MG tablet TAKE AS DIRECTED BY ANTICOAGULATION CLINIC Patient taking differently: TAKE  3 MG BY MOUTH DAILY AT BEDTIME OR AS DIRECTED BY ANTICOAGULATION CLINIC 09/30/15   Cassandria Anger, MD  Current Facility-Administered Medications  Medication Dose Route Frequency Provider Last Rate Last Dose  . 0.45 % sodium chloride infusion   Intravenous Continuous Cherene Altes, MD      . acetaminophen (TYLENOL) suppository 650 mg  650 mg Rectal Q4H PRN Cherene Altes, MD   650 mg at 10/06/15 0955  . acetaminophen (TYLENOL) tablet 650 mg  650 mg Oral Q4H PRN Allie Bossier, MD   650 mg at 10/05/15 2231  . albuterol (PROVENTIL) (2.5 MG/3ML) 0.083% nebulizer solution 2.5 mg  2.5 mg Nebulization Q2H PRN Cherene Altes, MD      . allopurinol (ZYLOPRIM) tablet 100 mg  100 mg Oral Daily Allie Bossier, MD   100 mg at 10/06/15 1053  . antiseptic oral rinse (CPC / CETYLPYRIDINIUM CHLORIDE 0.05%) solution 7 mL  7 mL Mouth Rinse q12n4p Cherene Altes, MD      . aspirin EC tablet 81 mg  81 mg Oral Daily Allie Bossier, MD   81 mg at 10/05/15 1600  . brimonidine (ALPHAGAN) 0.2 % ophthalmic solution 1 drop  1 drop Both Eyes BID Cherene Altes, MD   1 drop at 10/06/15 1053  . bumetanide (BUMEX) injection 2 mg  2 mg Intravenous TID Cherene Altes, MD   2 mg at 10/06/15 0956  . buPROPion (WELLBUTRIN SR) 12 hr tablet 150 mg  150 mg Oral Daily Allie Bossier, MD   150 mg at 10/06/15 1055  . carvedilol (COREG) tablet 12.5 mg  12.5  mg Oral BID WC Allie Bossier, MD   12.5 mg at 10/05/15 1716  . cefTRIAXone (ROCEPHIN) 1 g in dextrose 5 % 50 mL IVPB  1 g Intravenous Q24H Cherene Altes, MD   1 g at 10/06/15 0954  . chlorhexidine (PERIDEX) 0.12 % solution 15 mL  15 mL Mouth Rinse BID Cherene Altes, MD      . cholecalciferol (VITAMIN D) tablet 1,000 Units  1,000 Units Oral BID Allie Bossier, MD   1,000 Units at 10/05/15 2102  . hydrALAZINE (APRESOLINE) tablet 50 mg  50 mg Oral TID Allie Bossier, MD   50 mg at 10/05/15 2101  . hydrocortisone sodium succinate (SOLU-CORTEF) 100 MG injection 50 mg  50 mg Intravenous Q12H Cherene Altes, MD   50 mg at 10/06/15 1041  . isosorbide mononitrate (IMDUR) 24 hr tablet 60 mg  60 mg Oral Daily Allie Bossier, MD   60 mg at 10/06/15 1054  . levothyroxine (SYNTHROID, LEVOTHROID) tablet 137 mcg  137 mcg Oral QAC breakfast Allie Bossier, MD   137 mcg at 10/06/15 [phone]  . mometasone-formoterol (DULERA) 100-5 MCG/ACT inhaler 2 puff  2 puff Inhalation BID Allie Bossier, MD   2 puff at 10/05/15 2058  . ondansetron (ZOFRAN) injection 4 mg  4 mg Intravenous Q6H PRN Allie Bossier, MD   4 mg at 10/06/15 0655  . pantoprazole (PROTONIX) injection 40 mg  40 mg Intravenous Q12H Cherene Altes, MD   40 mg at 10/06/15 0953  . PERFLUTREN LIPID MICROSPHERE injection SUSP           . prednisoLONE acetate (PRED FORTE) 1 % ophthalmic suspension 1 drop  1 drop Both Eyes Daily PRN Allie Bossier, MD      . sodium chloride flush (NS) 0.9 % injection 10-40 mL  10-40 mL Intracatheter PRN Allie Bossier, MD      . timolol (TIMOPTIC) 0.5 %  ophthalmic solution 1 drop  1 drop Both Eyes BID Allie Bossier, MD   1 drop at 10/06/15 0957    Allergies as of 10/05/2015 - Review Complete 10/05/2015  Allergen Reaction Noted  . Amlodipine  12/30/2011  . Atorvastatin Other (See Comments) 03/22/2007  . Colesevelam Other (See Comments) 03/22/2007  . Coumadin [warfarin sodium]  04/26/2015  . Lasix [furosemide] Other  (See Comments) 08/07/2011  . Statins Other (See Comments) 11/18/2011  . Tape Rash 11/18/2011    Family History  Problem Relation Age of Onset  . Hypertension    . Congestive Heart Failure Mother 44  . Breast cancer Mother   . Heart attack Mother   . Heart disease Father 90  . Heart disease Brother   . Coronary artery disease Son 40    2 Stents  . Heart attack Father   . AAA (abdominal aortic aneurysm) Brother     Social History   Social History  . Marital Status: Married    Spouse Name: N/A  . Number of Children: 3  . Years of Education: N/A   Occupational History  . Retired    Social History Main Topics  . Smoking status: Former Smoker -- 2.00 packs/day for 40 years    Types: Cigarettes    Quit date: 05/25/2002  . Smokeless tobacco: Never Used  . Alcohol Use: No  . Drug Use: No  . Sexual Activity: Not Currently   Other Topics Concern  . Not on file   Social History Narrative   Lives with husband and youngest son.      Review of Systems: Unable to obtain due to patient's respiratory status  Physical Exam: Vital signs in last 24 hours: Temp:  [96.8 F (36 C)-101.8 F (38.8 C)] 101.8 F (38.8 C) (05/14 1205) Pulse Rate:  [56-71] 71 (05/14 0903) Resp:  [13-23] 23 (05/14 0903) BP: (91-175)/(34-97) 149/43 mmHg (05/14 0805) SpO2:  [79 %-97 %] 95 % (05/14 0903) FiO2 (%):  [40 %] 40 % (05/14 0903) Weight:  [101.606 kg (224 lb)-106.777 kg (235 lb 6.4 oz)] 106.777 kg (235 lb 6.4 oz) (05/14 0433) Last BM Date: 10/01/15 General:   Alert,  Respiratory distress, on BiPap Head:  Normocephalic and atraumatic. Eyes:  Sclera clear, no icterus.   Conjunctiva pink. Ears:  Normal auditory acuity. Nose:  No deformity, discharge,  or lesions. Mouth:  No deformity or lesions.  Oropharynx pink & moist. Neck:  Supple; no masses or thyromegaly. Lungs:  Clear throughout to auscultation.   No wheezes, crackles, or rhonchi. No acute distress. Heart:  Regular rate and rhythm; no  murmurs, clicks, rubs,  or gallops. Abdomen:  Soft, protuberant, nontender and nondistended. No masses, hepatosplenomegaly or hernias noted. Normal bowel sounds, without guarding, and without rebound.     Msk:  Symmetrical without gross deformities. Normal posture. Pulses:  Normal pulses noted. Extremities: 1-2+ edema bilateral lower extremities; no clubbing Neurologic:  Alert and  oriented x4;  Diffusely weak Skin:  Scattered ecchymoses, otherwise intact without significant lesions or rashes. Psych:  Alert and cooperative. Normal mood and affect.   Lab Results:  Recent Labs  10/05/15 1150 10/06/15 0510  WBC 10.9* 10.6*  HGB 5.8* 9.8*  HCT 23.2* 33.4*  PLT 302 279   BMET  Recent Labs  10/05/15 1150 10/06/15 0510  NA 135 140  K 4.7 4.9  CL 109 109  CO2 16* 16*  GLUCOSE 108* 86  BUN 52* 55*  CREATININE 2.68* 2.66*  CALCIUM  8.4* 8.7*   LFT  Recent Labs  10/06/15 0510  PROT 6.3*  ALBUMIN 3.2*  AST 21  ALT 17  ALKPHOS 91  BILITOT 2.7*   PT/INR  Recent Labs  10/05/15 1150 10/06/15 0510  LABPROT 45.8* 18.0*  INR 5.13* 1.48    Studies/Results: Dg Chest Port 1 View  10/06/2015  CLINICAL DATA:  PICC line placement EXAM: PORTABLE CHEST 1 VIEW COMPARISON:  10/05/2015 FINDINGS: There is a new left upper extremity PICC line with tip in the expected location of the SVC-azygos vein junction. There is unchanged moderate cardiomegaly. IMPRESSION: Satisfactorily positioned left upper extremity PICC line Electronically Signed   By: Andreas Newport M.D.   On: 10/06/2015 04:43   Dg Chest Portable 1 View  10/05/2015  CLINICAL DATA:  Shortness of breath, weakness, bilateral leg edema EXAM: PORTABLE CHEST 1 VIEW COMPARISON:  07/23/2015 FINDINGS: Cardiomegaly is noted. Central mild vascular congestion without pulmonary edema. Stable linear atelectasis or scarring in lingula. Mild basilar atelectasis without segmental infiltrate. IMPRESSION: Cardiomegaly. Central vascular  congestion without pulmonary edema. Mild basilar atelectasis. Stable linear atelectasis or scarring in lingula. Electronically Signed   By: Lahoma Crocker M.D.   On: 10/05/2015 13:30   Dg Shoulder Right Port  10/05/2015  CLINICAL DATA:  Right shoulder pain, fell at home EXAM: PORTABLE RIGHT SHOULDER - 2+ VIEW COMPARISON:  None. FINDINGS: Two views of the right shoulder submitted. No acute fracture or subluxation. Degenerative changes AC joint. Minimal spurring of humeral head IMPRESSION: No acute fracture or subluxation.  Mild degenerative changes. Electronically Signed   By: Lahoma Crocker M.D.   On: 10/05/2015 13:31   Impression:  1.  Anemia.  Colonoscopy in October 2014 by Dr. Amedeo Plenty showed left-sided diverticulosis and internal hemorrhoids. 2.  Hemoccult-positive stools without overt blood in stool. 3.  Respiratory distress, presently on BiPap. 4.  Coagulopathy, resolved.  Plan:  1.   PPI. 2.  Clear liquid diet ok. 3.  Patient is in no shape, from respiratory perspective, for endoscopic evaluation for her anemia and hemoccult-positive stool, in absence of over destabilizing bleeding, which patient fortunately does not have. 4.  Management of respiratory distress per primary team.   5.  I have discussed with Dr. Thereasa Solo, who feels we can follow along at a distance while patient's respiratory difficulty is being treated. 6.  [facility] will follow at a distance.   LOS: 1 day   Kelyse Pask M  10/06/2015, 12:15 PM  Pager [phone] If no answer or after 5 PM call [phone]

## 2015-10-06 NOTE — Progress Notes (Signed)
  Echocardiogram 2D Echocardiogram has been performed.  Wendy Cole 10/06/2015, 10:02 AM

## 2015-10-06 NOTE — Progress Notes (Signed)
[facility] TEAM 1 - Stepdown/ICU TEAM  HADASAH LAURANCE  [id number] DOB: 08/01/1935 DOA: 10/05/2015 PCP: Walker Kehr, MD    Brief Narrative:  80yo F Hx Anxiety, Depression, CVA, CAD, Paroxysmal Atrial Fibrillation on Warfarin, Chronic Severe Diastolic CHF, HTN, Stage III CKD, Hypothyroid, HLD, DVT, OSA, and Breast Cancer S/P R Mastectomy who presented to the ED complaining of BLE swelling and shortness of breath, with increasing weakness since DC from hospital on 07-30-15 due to a syncopal episode. She called EMS after she was unable to get off the toilet.   In the ED the patient was grossly volume overloaded on exam, and guiac positive w/ brown stool.  She was found to have a Hgb of 5.8, and an INR of 5.8  Assessment & Plan:  Obtundation - Acute altered mental status OSA/OHS likely playing a role - appears clinically stable at present - B12, folate, and ammonia recently normal - UTI likely contributing   UTI  Initiate rocephin - f/u culture data   GI bleed no evidence of large volume gross blood loss - stool was brown but guiac + - INR was supratherapeutic - follow - empiric PPI  Acute blood loss anemia  Hgb 7.9 07/27/15 - now s/p 3U PRBC - f/u Hgb much improved - follow trend   Recent Labs Lab 10/05/15 1150 10/06/15 0510  HGB 5.8* 9.8*    Supratherapeutic INR corrected w/ vitamin K - INR now essentially normalized   Acute exacerbation of Chronic grade 2 diastolic CHF baseline weight is 93kg (07/27/15) - she is grossly volume overloaded - continue aggressive diuresis  Filed Weights   10/05/15 1238 10/05/15 1609 10/06/15 0433  Weight: 101.606 kg (224 lb) 101.606 kg (224 lb) 106.777 kg (235 lb 6.4 oz)    CKD stage III crt 2.33 at time of d/c 07/27/15  Recent Labs Lab 10/05/15 1150 10/06/15 0510  CREATININE 2.68* 2.66*    Paroxysmal Atrial fibrillation currently in NSR - anticoag on hold due to GIB  OSA CPAP per respiratory therapy - BIPAP QHS   DM 2  A1c  pending - CBG currently well controlled   HLD  Hypothyroidism continue Synthroid   Remote hx of DVT most recent duplex is 2014 and was negative for DVT  Anxiety  Depression  Morbid obesity - Body mass index is 38.01 kg/(m^2).  DVT prophylaxis: SCDs Code Status: FULL CODE Family Communication: no family present at time of exam  Disposition Plan: SDU  Consultants:  CHF Team [facility]   Procedures:  PICC 5/13  Antimicrobials:  none   Subjective: I was called to the bedside by the RN as the pt was less responsive.  She is very difficult to wake up.  She is on BIPAP which she seems to be tolerating well.  She does not appear to be uncomfortable.  Her sats are 90+, and her vitals are otherwise stable.  She can not provide a hx due to altered mental status.    Objective: Blood pressure 149/43, pulse 70, temperature 101.6 F (38.7 C), temperature source Axillary, resp. rate 22, height 5\' 6"  (1.676 m), weight 106.777 kg (235 lb 6.4 oz), SpO2 97 %.  Intake/Output Summary (Last 24 hours) at 10/06/15 0827 Last data filed at 10/06/15 0544  Gross per 24 hour  Intake   1096 ml  Output   2450 ml  Net  -1354 ml   Filed Weights   10/05/15 1238 10/05/15 1609 10/06/15 0433  Weight: 101.606 kg (224 lb) 101.606  kg (224 lb) 106.777 kg (235 lb 6.4 oz)    Examination: General: obtunded - tolerating BIPAP well - opens eyes to sternal rub but does not speak  Lungs: diffuse crackles - poor air movement th/o all fields - no wheeze  Cardiovascular: Regular rate and rhythm without murmur gallop or rub  Abdomen: Obese, nondistended, soft, bowel sounds positive, no rebound Extremities: No significant cyanosis, or clubbing;  3+ edema bilateral lower extremities into thighs  CBC:  Recent Labs Lab 10/05/15 1150 10/06/15 0510  WBC 10.9* 10.6*  NEUTROABS  --  9.6*  HGB 5.8* 9.8*  HCT 23.2* 33.4*  MCV 72.7* 75.7*  PLT 302 [id number]   Basic Metabolic Panel:  Recent Labs Lab 10/05/15 1150  10/05/15 1538 10/06/15 0510  NA 135  --  140  K 4.7  --  4.9  CL 109  --  109  CO2 16*  --  16*  GLUCOSE 108*  --  86  BUN 52*  --  55*  CREATININE 2.68*  --  2.66*  CALCIUM 8.4*  --  8.7*  MG  --  1.7 1.6*   GFR: Estimated Creatinine Clearance: 21.9 mL/min (by C-G formula based on Cr of 2.66).  Liver Function Tests:  Recent Labs Lab 10/05/15 1150 10/06/15 0510  AST 18 21  ALT 14 17  ALKPHOS 79 91  BILITOT 0.9 2.7*  PROT 6.0* 6.3*  ALBUMIN 3.0* 3.2*   Coagulation Profile:  Recent Labs Lab 10/04/15 10/05/15 1150 10/06/15 0510  INR 6.7 5.13* 1.48    Cardiac Enzymes:  Recent Labs Lab 10/05/15 1538 10/05/15 2120 10/06/15 0510  TROPONINI 0.06* 0.03 0.03   CBG: No results for input(s): GLUCAP in the last 168 hours.  Recent Results (from the past 240 hour(s))  MRSA PCR Screening     Status: None   Collection Time: 10/05/15  5:38 PM  Result Value Ref Range Status   MRSA by PCR NEGATIVE NEGATIVE Final    Comment:        The GeneXpert MRSA Assay (FDA approved for NASAL specimens only), is one component of a comprehensive MRSA colonization surveillance program. It is not intended to diagnose MRSA infection nor to guide or monitor treatment for MRSA infections.      Scheduled Meds: . sodium chloride  10 mL/hr Intravenous Once  . allopurinol  100 mg Oral Daily  . antiseptic oral rinse  7 mL Mouth Rinse q12n4p  . aspirin EC  81 mg Oral Daily  . brimonidine  1 drop Both Eyes Daily  . bumetanide  2 mg Intravenous TID  . buPROPion  150 mg Oral Daily  . carvedilol  12.5 mg Oral BID WC  . chlorhexidine  15 mL Mouth Rinse BID  . cholecalciferol  1,000 Units Oral BID  . clorazepate  3.75 mg Oral BID  . ferrous sulfate  325 mg Oral Q breakfast  . furosemide  40 mg Intravenous Once  . gabapentin  400 mg Oral BID  . hydrALAZINE  50 mg Oral TID  . HYDROcodone-acetaminophen  2 tablet Oral Once  . isosorbide mononitrate  60 mg Oral Daily  . levothyroxine   137 mcg Oral QAC breakfast  . mometasone-formoterol  2 puff Inhalation BID  . sodium chloride flush  10-40 mL Intracatheter Q12H  . sodium chloride flush  3 mL Intravenous Q12H  . timolol  1 drop Both Eyes BID   Continuous Infusions:    LOS: 1 day   Time spent: 35 minutes  Cherene Altes, MD [facility] Office  [phone] Pager - Text Page per Amion as per below:  On-Call/Text Page:      [email or web address]      password [email or web address]  If 7PM-7AM, please contact night-coverage [email or web address] Password [email or web address] 10/06/2015, 8:27 AM

## 2015-10-06 NOTE — Progress Notes (Signed)
Utilization review completed.  

## 2015-10-07 ENCOUNTER — Inpatient Hospital Stay (HOSPITAL_COMMUNITY): Payer: Medicare Other

## 2015-10-07 LAB — PROTIME-INR
INR: 1.49 (ref 0.00–1.49)
PROTHROMBIN TIME: 18.1 s — AB (ref 11.6–15.2)

## 2015-10-07 LAB — BLOOD GAS, ARTERIAL
ACID-BASE DEFICIT: 6.5 mmol/L — AB (ref 0.0–2.0)
Bicarbonate: 17.5 mEq/L — ABNORMAL LOW (ref 20.0–24.0)
DELIVERY SYSTEMS: POSITIVE
Drawn by: 345601
Expiratory PAP: 5
FIO2: 0.4
INSPIRATORY PAP: 10
O2 Saturation: 99 %
PCO2 ART: 28.6 mmHg — AB (ref 35.0–45.0)
PH ART: 7.402 (ref 7.350–7.450)
Patient temperature: 98
TCO2: 18.4 mmol/L (ref 0–100)
pO2, Arterial: 118 mmHg — ABNORMAL HIGH (ref 80.0–100.0)

## 2015-10-07 LAB — CBC
HCT: 27.6 % — ABNORMAL LOW (ref 36.0–46.0)
Hemoglobin: 7.9 g/dL — ABNORMAL LOW (ref 12.0–15.0)
MCH: 21.4 pg — AB (ref 26.0–34.0)
MCHC: 28.6 g/dL — AB (ref 30.0–36.0)
MCV: 74.8 fL — ABNORMAL LOW (ref 78.0–100.0)
PLATELETS: 252 10*3/uL (ref 150–400)
RBC: 3.69 MIL/uL — ABNORMAL LOW (ref 3.87–5.11)
RDW: 20.2 % — AB (ref 11.5–15.5)
WBC: 8.4 10*3/uL (ref 4.0–10.5)

## 2015-10-07 LAB — GLUCOSE, CAPILLARY: Glucose-Capillary: 123 mg/dL — ABNORMAL HIGH (ref 65–99)

## 2015-10-07 LAB — COMPREHENSIVE METABOLIC PANEL
ALBUMIN: 2.6 g/dL — AB (ref 3.5–5.0)
ALK PHOS: 70 U/L (ref 38–126)
ALT: 14 U/L (ref 14–54)
ANION GAP: 10 (ref 5–15)
AST: 16 U/L (ref 15–41)
BILIRUBIN TOTAL: 1.4 mg/dL — AB (ref 0.3–1.2)
BUN: 57 mg/dL — AB (ref 6–20)
CALCIUM: 8 mg/dL — AB (ref 8.9–10.3)
CO2: 20 mmol/L — AB (ref 22–32)
CREATININE: 2.68 mg/dL — AB (ref 0.44–1.00)
Chloride: 107 mmol/L (ref 101–111)
GFR calc Af Amer: 19 mL/min — ABNORMAL LOW (ref >60)
GFR calc non Af Amer: 16 mL/min — ABNORMAL LOW (ref >60)
GLUCOSE: 145 mg/dL — AB (ref 65–99)
Potassium: 4.1 mmol/L (ref 3.5–5.1)
SODIUM: 137 mmol/L (ref 135–145)
TOTAL PROTEIN: 5.4 g/dL — AB (ref 6.5–8.1)

## 2015-10-07 LAB — HEMOGLOBIN A1C: Mean Plasma Glucose: <77 mg/dL

## 2015-10-07 LAB — MAGNESIUM: Magnesium: 1.7 mg/dL (ref 1.7–2.4)

## 2015-10-07 MED ORDER — PANTOPRAZOLE SODIUM 40 MG PO TBEC
40.0000 mg | DELAYED_RELEASE_TABLET | Freq: Two times a day (BID) | ORAL | Status: DC
  Administered 2015-10-07 – 2015-10-30 (×40): 40 mg via ORAL
  Filled 2015-10-07 (×41): qty 1

## 2015-10-07 MED ORDER — PREDNISONE 10 MG PO TABS
5.0000 mg | ORAL_TABLET | Freq: Every day | ORAL | Status: DC
  Administered 2015-10-08 – 2015-10-11 (×4): 5 mg via ORAL
  Filled 2015-10-07 (×4): qty 1

## 2015-10-07 NOTE — Significant Event (Addendum)
Rapid Response Event Note Called per [facility] NP Tylene Fantasia to assess Pt with neurological change. Per NP floor RN called for abnormal pupil response and somnolence. STAT CT scan ordred following RRT assessement per NP.   Overview: Time Called: 0030 Arrival Time: 0033 Event Type: Respiratory, Neurologic  Initial Focused Assessment: Pt found resting in bed on BIPAP mask. VSS. Pt withdraws to pain x 4 extremeties but does not respond to voice intially. Sternal rub done, Pt awake attempts to hit staff, eye contact, purposeful movement, state "Yall leave me alone", refuses to follow commands. Bilateral pupils size 2 sluggish equal response, face symetrical. Lungs diminished clear, ABD soft. ABG done per protocol. PH 7.4, Co2 28.6 Po2 118 Bicarb 17.5. K. Kirby called back at 0055 updated on my assessment and ABG results. New Provider to take Call tonight, will be updated per NP to follow up on CT results tonight. Pt taken to CT scan and returned without incident. Pt left resting in bed opens eyes and tracks to voice. RN to monitor closely, advised to update myself and Provider on CT scan results and for worsening changes.   Interventions:   Event Summary: Name of Physician Notified: Tylene Fantasia at [phone]    at    Outcome: Stayed in room and stabalized     [place], [place]

## 2015-10-07 NOTE — Progress Notes (Signed)
[place] TEAM 1 - Stepdown/ICU TEAM  Wendy Cole  [id number] DOB: 1935/06/28 DOA: 10/05/2015 PCP: Walker Kehr, MD    Brief Narrative:  80yo F Hx Anxiety, Depression, CVA, CAD, Paroxysmal Atrial Fibrillation on Warfarin, Chronic Severe Diastolic CHF, HTN, Stage III CKD, Hypothyroid, HLD, DVT, OSA, and Breast Cancer S/P R Mastectomy who presented to the ED complaining of BLE swelling and shortness of breath, with increasing weakness since DC from hospital on 07-30-15 due to a syncopal episode. She called EMS after she was unable to get off the toilet.   In the ED the patient was grossly volume overloaded on exam, and guiac positive w/ brown stool.  She was found to have a Hgb of 5.8, and an INR of 5.8  Assessment & Plan:  Obtundation - Toxic metabolic encephalopathy  OSA/OHS likely playing a role, especially at night - appears clinically stable today - B12, folate, and ammonia recently normal - UTI likely contributing as well - cont BIPAP support when asleep at night or napping   S agalactiae UTI  Cont rocephin   GI bleed no evidence of large volume gross blood loss - stool was brown but guiac + - INR was supratherapeutic - follow - empiric PPI - will need GI eval when more stable prior to resuming anticoag   Acute blood loss anemia  Hgb 7.9 07/27/15 - now s/p 3U PRBC - follow Hgb trend    Recent Labs Lab 10/05/15 1150 10/06/15 0510 10/06/15 1447 10/07/15 0308  HGB 5.8* 9.8* 8.2* 7.9*    Supratherapeutic INR corrected w/ vitamin K - INR now essentially normalized   Acute exacerbation of Chronic grade 2 diastolic CHF baseline weight is 93kg (07/27/15) - she remains grossly volume overloaded - continue aggressive diuresis - net negative ~5L since admit  [facility] Weights   10/05/15 1609 10/06/15 0433 10/07/15 0343  Weight: 101.606 kg (224 lb) 106.777 kg (235 lb 6.4 oz) 97.659 kg (215 lb 4.8 oz)    CKD stage III crt 2.33 at time of d/c 07/27/15 - follow w/ ongoing diuresis    Recent Labs Lab 10/05/15 1150 10/06/15 0510 10/07/15 0308  CREATININE 2.68* 2.66* 2.68*    Paroxysmal Atrial fibrillation currently in NSR - anticoag on hold due to GIB - will need GI eval prior to resuming   OSA CPAP per respiratory therapy - BIPAP QHS and when napping during day   DM 2  A1c erroneous (<4.3) - CBG well controlled   HLD  Hypothyroidism continue Synthroid   Remote hx of DVT most recent duplex is 2014 and was negative for DVT - recheck   Anxiety  Depression  Morbid obesity - Body mass index is 34.77 kg/(m^2).  DVT prophylaxis: SCDs Code Status: FULL CODE Family Communication: no family present at time of exam  Disposition Plan: SDU  Consultants:  CHF Team [facility]   Procedures:  PICC 5/13  Antimicrobials:  none   Subjective: The patient is alert and interactive at this time.  She complains of severe pain in both legs.  She also complains of epigastric pain.  She denies substernal chest pressure or current shortness of breath.  Objective: Blood pressure 155/50, pulse 61, temperature 96.7 F (35.9 C), temperature source Axillary, resp. rate 15, height 5\' 6"  (1.676 m), weight 97.659 kg (215 lb 4.8 oz), SpO2 98 %.  Intake/Output Summary (Last 24 hours) at 10/07/15 1518 Last data filed at 10/07/15 1400  Gross per 24 hour  Intake    525  ml  Output   3375 ml  Net  -2850 ml   Filed Weights   10/05/15 1609 10/06/15 0433 10/07/15 0343  Weight: 101.606 kg (224 lb) 106.777 kg (235 lb 6.4 oz) 97.659 kg (215 lb 4.8 oz)    Examination: General: No acute distress at present Lungs: diffuse crackles - poor air movement th/o all fields   Cardiovascular: Regular rate and rhythm without murmur - frequent ectopic beats Abdomen: Obese, nondistended, soft, bowel sounds positive, no rebound Extremities: No significant cyanosis, or clubbing - 3+ edema bilateral lower extremities into thighs  CBC:  Recent Labs Lab 10/05/15 1150 10/06/15 0510  10/06/15 1447 10/07/15 0308  WBC 10.9* 10.6* 10.0 8.4  NEUTROABS  --  9.6*  --   --   HGB 5.8* 9.8* 8.2* 7.9*  HCT 23.2* 33.4* 28.8* 27.6*  MCV 72.7* 75.7* 74.8* 74.8*  PLT 302 279 265 [id number]   Basic Metabolic Panel:  Recent Labs Lab 10/05/15 1150 10/05/15 1538 10/06/15 0510 10/07/15 0308  NA 135  --  140 137  K 4.7  --  4.9 4.1  CL 109  --  109 107  CO2 16*  --  16* 20*  GLUCOSE 108*  --  86 145*  BUN 52*  --  55* 57*  CREATININE 2.68*  --  2.66* 2.68*  CALCIUM 8.4*  --  8.7* 8.0*  MG  --  1.7 1.6* 1.7   GFR: Estimated Creatinine Clearance: 20.7 mL/min (by C-G formula based on Cr of 2.68).  Liver Function Tests:  Recent Labs Lab 10/05/15 1150 10/06/15 0510 10/07/15 0308  AST 18 21 16   ALT 14 17 14   ALKPHOS 79 91 70  BILITOT 0.9 2.7* 1.4*  PROT 6.0* 6.3* 5.4*  ALBUMIN 3.0* 3.2* 2.6*   Coagulation Profile:  Recent Labs Lab 10/04/15 10/05/15 1150 10/06/15 0510 10/07/15 0308  INR 6.7 5.13* 1.48 1.49    Cardiac Enzymes:  Recent Labs Lab 10/05/15 1538 10/05/15 2120 10/06/15 0510  TROPONINI 0.06* 0.03 0.03   CBG:  Recent Labs Lab 10/06/15 0801 10/06/15 1146 10/06/15 1558 10/06/15 2041 10/07/15 0034  GLUCAP 90 80 116* 149* 123*    Recent Results (from the past 240 hour(s))  Urine culture     Status: Abnormal   Collection Time: 10/05/15 12:33 PM  Result Value Ref Range Status   Specimen Description URINE, CATHETERIZED  Final   Special Requests NONE  Final   Culture (A)  Final    >=100,000 COLONIES/mL GROUP B STREP(S.AGALACTIAE)ISOLATED TESTING AGAINST S. AGALACTIAE NOT ROUTINELY PERFORMED DUE TO PREDICTABILITY OF AMP/PEN/VAN SUSCEPTIBILITY.    Report Status 10/06/2015 FINAL  Final  MRSA PCR Screening     Status: None   Collection Time: 10/05/15  5:38 PM  Result Value Ref Range Status   MRSA by PCR NEGATIVE NEGATIVE Final    Comment:        The GeneXpert MRSA Assay (FDA approved for NASAL specimens only), is one component of  a comprehensive MRSA colonization surveillance program. It is not intended to diagnose MRSA infection nor to guide or monitor treatment for MRSA infections.      Scheduled Meds: . allopurinol  100 mg Oral Daily  . antiseptic oral rinse  7 mL Mouth Rinse q12n4p  . aspirin EC  81 mg Oral Daily  . brimonidine  1 drop Both Eyes BID  . bumetanide  2 mg Intravenous TID  . buPROPion  150 mg Oral Daily  . carvedilol  12.5 mg Oral BID  WC  . cefTRIAXone (ROCEPHIN)  IV  1 g Intravenous Q24H  . chlorhexidine  15 mL Mouth Rinse BID  . cholecalciferol  1,000 Units Oral BID  . hydrALAZINE  50 mg Oral TID  . hydrocortisone sod succinate (SOLU-CORTEF) inj  50 mg Intravenous Q12H  . isosorbide mononitrate  60 mg Oral Daily  . levothyroxine  137 mcg Oral QAC breakfast  . mometasone-formoterol  2 puff Inhalation BID  . pantoprazole (PROTONIX) IV  40 mg Intravenous Q12H  . timolol  1 drop Both Eyes BID   Continuous Infusions: . sodium chloride 10 mL/hr at 10/06/15 0930     LOS: 2 days   Time spent: 35 minutes   Cherene Altes, MD [facility] Office  [phone] Pager - Text Page per Shea Evans as per below:  On-Call/Text Page:      [email or web address]      password [email or web address]  If 7PM-7AM, please contact night-coverage [email or web address] Password [facility] 10/07/2015, 3:18 PM

## 2015-10-07 NOTE — Progress Notes (Signed)
Patient reponsive to only sternal rub and painful stimuli. Pupils equal in size 3. Rt eye not responsive Lt eye sluggish. Bil hand grip present, but weak. No foot movement. Glucose 123. Vitals stable and on bipap 40%Fio2. Np notified of gradual worsening of LOC throughout shift.. Rapid response notified. ABG drawn and head CT obtained. Nurse will continue to closely monitor.

## 2015-10-07 NOTE — Clinical Documentation Improvement (Signed)
Hospitalist Please update your documentation within the medical record to reflect your response to this query. Thank you  Can the diagnosis of altered mental status be further specified?   Drowsiness/somnolence  Transient alteration of awareness  Encephalopathy - Alcoholic, Anoxic/Hypoxia, Drug Induced/Toxic (specify drug), Hepatic, Hypertensive, Hypoglycemic, Metabolic/Septic, Traumatic/post concussive, Wernicke, Other  Other  Clinically Undetermined  Document any associated diagnoses/conditions.  Supporting Information: 10/06/15 prog note..."Obtundation - Acute altered mental status OSA/OHS likely playing a role - appears clinically stable at present - B12, folate, and ammonia recently normal - UTI likely contributing..." 10/07/15 1:08am prog note .Marland KitchenMarland Kitchen"After review, since pt's status now was same as times earlier on 5/14, asked RN to apply bipap and watch her for now. Placed pt on q2hr neuro checks overnight.".Marland KitchenMarland Kitchen  Please exercise your independent, professional judgment when responding. A specific answer is not anticipated or expected.  Thank You,  Ermelinda Das, RN, BSN, [place] Certified Clinical Documentation Specialist [place]: Health Information Management [phone]

## 2015-10-07 NOTE — Progress Notes (Signed)
RN paged because pt's mental status had changed since earlier in shift. Pt more sleepy now. Grips +/= bilaterally. Able to wiggle feet.  NP reviewed chart. According to MD notes, pt's mental status has waxed and waned since admission-ranging from obtundation to oriented x 3. This is thought to be due to her OSA/obesity and acute infection. After review, since pt's status now was same as times earlier on 5/14, asked RN to apply bipap and watch her for now. Placed pt on q2hr neuro checks overnight. Later, after next neuro check, RN paged because pt was less responsive and would not follow commands. RN stated pupils were asymmetrical. Stat CT head without contrast placed and rapid response RN called to assess pt. After RRRN assessed pt, NP spoke back with her.  Per RRRN, pt awakens now but seems to not want to make an effort to participate in exam. When asked to do certain things, pt says "leave me alone". Pupils are equal. MOE x 4. RRRN does not suspect stroke, but will go ahead and get a CT head. ABG did not show acidosis or hypercarbia.  Report off to oncoming PA for floor call to f/up CT.  KJKG, NP [facility]

## 2015-10-08 ENCOUNTER — Inpatient Hospital Stay (HOSPITAL_COMMUNITY): Payer: Medicare Other

## 2015-10-08 DIAGNOSIS — R600 Localized edema: Secondary | ICD-10-CM

## 2015-10-08 LAB — CBC
HCT: 28.3 % — ABNORMAL LOW (ref 36.0–46.0)
Hemoglobin: 8.1 g/dL — ABNORMAL LOW (ref 12.0–15.0)
MCH: 21.4 pg — AB (ref 26.0–34.0)
MCHC: 28.6 g/dL — AB (ref 30.0–36.0)
MCV: 74.7 fL — ABNORMAL LOW (ref 78.0–100.0)
Platelets: 246 10*3/uL (ref 150–400)
RBC: 3.79 MIL/uL — ABNORMAL LOW (ref 3.87–5.11)
RDW: 20.8 % — AB (ref 11.5–15.5)
WBC: 9.3 10*3/uL (ref 4.0–10.5)

## 2015-10-08 LAB — COMPREHENSIVE METABOLIC PANEL
ALBUMIN: 2.5 g/dL — AB (ref 3.5–5.0)
ALK PHOS: 68 U/L (ref 38–126)
ALT: 13 U/L — AB (ref 14–54)
AST: 14 U/L — AB (ref 15–41)
Anion gap: 13 (ref 5–15)
BILIRUBIN TOTAL: 0.9 mg/dL (ref 0.3–1.2)
BUN: 53 mg/dL — AB (ref 6–20)
CALCIUM: 8.3 mg/dL — AB (ref 8.9–10.3)
CO2: 22 mmol/L (ref 22–32)
CREATININE: 2.44 mg/dL — AB (ref 0.44–1.00)
Chloride: 103 mmol/L (ref 101–111)
GFR calc Af Amer: 21 mL/min — ABNORMAL LOW (ref >60)
GFR calc non Af Amer: 18 mL/min — ABNORMAL LOW (ref >60)
GLUCOSE: 138 mg/dL — AB (ref 65–99)
Potassium: 3.4 mmol/L — ABNORMAL LOW (ref 3.5–5.1)
Sodium: 138 mmol/L (ref 135–145)
TOTAL PROTEIN: 5.2 g/dL — AB (ref 6.5–8.1)

## 2015-10-08 LAB — PROTIME-INR
INR: 1.41 (ref 0.00–1.49)
PROTHROMBIN TIME: 17.3 s — AB (ref 11.6–15.2)

## 2015-10-08 MED ORDER — MORPHINE SULFATE (PF) 2 MG/ML IV SOLN
2.0000 mg | Freq: Once | INTRAVENOUS | Status: AC
  Administered 2015-10-08: 2 mg via INTRAVENOUS

## 2015-10-08 MED ORDER — HYDRALAZINE HCL 20 MG/ML IJ SOLN
10.0000 mg | Freq: Once | INTRAMUSCULAR | Status: AC
  Administered 2015-10-08: 10 mg via INTRAVENOUS
  Filled 2015-10-08: qty 1

## 2015-10-08 MED ORDER — MORPHINE SULFATE (PF) 2 MG/ML IV SOLN
INTRAVENOUS | Status: AC
  Filled 2015-10-08: qty 1

## 2015-10-08 NOTE — Progress Notes (Signed)
Arrived to patient's room for venous duplex. She immediately said I cannot touch her legs because she is in pain. I explained the only way to do the test involved touching the legs and doing compressions to rule out blood clots. She decided she would not like this test. I explained to RN what the patient said. RN talked to patient and patient decided she would try test. Patient then said she wants to eat first. RN was ok with patient eating first. I told RN I would try to get back this afternoon, but cannot garantee since this was patient's "spot" and I have other patients to see today.  Again, I will try to return later today to attempt test.  Landry Mellow, RDMS, RVT 10/08/2015

## 2015-10-08 NOTE — Progress Notes (Signed)
Placed on BIPAP for QHS. No distress noted.

## 2015-10-08 NOTE — Progress Notes (Signed)
[place] TEAM 1 - Stepdown/ICU TEAM  Wendy Cole  [id number] DOB: [birth year] DOA: 10/05/2015 PCP: Walker Kehr, MD    Brief Narrative:  80yo F Hx Anxiety, Depression, CVA, CAD, Paroxysmal Atrial Fibrillation on Warfarin, Chronic Severe Diastolic CHF, HTN, Stage III CKD, Hypothyroid, HLD, DVT, OSA, and Breast Cancer S/P R Mastectomy who presented to the ED complaining of B LE swelling and shortness of breath, with increasing weakness since DC from hospital on 07-30-15 due to a syncopal episode. She called EMS after she was unable to get off the toilet.   In the ED the patient was grossly volume overloaded on exam, and guiac positive w/ brown stool.  She was found to have a Hgb of 5.8, and an INR of 5.8  Assessment & Plan:  Obtundation - Toxic metabolic encephalopathy  OSA/OHS likely playing a role, especially at night - appears nearly resolved today w/ pt much more alert and interactive - B12, folate, and ammonia recently normal - UTI likely contributing as well - cont BIPAP support when asleep at night or napping   S agalactiae UTI  Cont rocephin   GI bleed no evidence of large volume gross blood loss - stool was brown but guiac + - INR was supratherapeutic - empiric PPI - will need GI eval when more stable prior to resuming anticoag - consider calling [facility] back for eval 5/17  Acute blood loss anemia  Hgb 7.9 07/27/15 - now s/p 3U PRBC - stable at this time   Recent Labs Lab 10/05/15 1150 10/06/15 0510 10/06/15 1447 10/07/15 0308 10/08/15 0544  HGB 5.8* 9.8* 8.2* 7.9* 8.1*    Supratherapeutic INR corrected w/ vitamin K - INR now normalized   Recent Labs Lab 10/04/15 10/05/15 1150 10/06/15 0510 10/07/15 0308 10/08/15 0639  INR 6.7 5.13* 1.48 1.49 1.41    Acute exacerbation of Chronic grade 2 diastolic CHF baseline weight is 93kg (07/27/15) - she remains grossly volume overloaded but is improving steadily - continue diuresis - net negative ~8L since admit    Filed Weights   10/06/15 0433 10/07/15 0343 10/08/15 0400  Weight: 106.777 kg (235 lb 6.4 oz) 97.659 kg (215 lb 4.8 oz) 99.1 kg (218 lb 7.6 oz)    CKD stage III crt 2.33 at time of d/c 07/27/15 - crt improving w/ ongoing diuresis   Recent Labs Lab 10/05/15 1150 10/06/15 0510 10/07/15 0308 10/08/15 0544  CREATININE 2.68* 2.66* 2.68* 2.44*    Paroxysmal Atrial fibrillation currently in NSR - anticoag (warfarin) on hold due to GIB - will need GI eval prior to resuming   OSA BIPAP QHS and when napping during day   DM 2  A1c erroneous (<4.3) - CBG well controlled   HLD  Hypothyroidism continue Synthroid   Remote hx of DVT most recent duplex is 2014 and was negative for DVT - recheck this admit w/o DVT    Anxiety  Depression  Morbid obesity - Body mass index is 35.28 kg/(m^2).  DVT prophylaxis: SCDs Code Status: FULL CODE Family Communication: no family present at time of exam  Disposition Plan: SDU  Consultants:  CHF Team [facility]   Procedures:  PICC 5/13 B LE venous duplex - 5/16 - negative for DVT   Antimicrobials:  Ceftriaxone 5/14 >  Subjective: The patient states she is feeling better today.  She is alert and oriented.  She denies chest pain but complains of severe tenderness to both legs diffusely.  There is no nausea or  vomiting.  She feels that her breathing is improving.  Objective: Blood pressure 115/47, pulse 61, temperature 97.6 F (36.4 C), temperature source Oral, resp. rate 15, height 5\' 6"  (1.676 m), weight 99.1 kg (218 lb 7.6 oz), SpO2 97 %.  Intake/Output Summary (Last 24 hours) at 10/08/15 1628 Last data filed at 10/08/15 1211  Gross per 24 hour  Intake    400 ml  Output   3350 ml  Net  -2950 ml   Filed Weights   10/06/15 0433 10/07/15 0343 10/08/15 0400  Weight: 106.777 kg (235 lb 6.4 oz) 97.659 kg (215 lb 4.8 oz) 99.1 kg (218 lb 7.6 oz)    Examination: General: No acute distress at rest in bed  Lungs: diffuse crackles - poor  air movement th/o all fields - no wheeze  Cardiovascular: Regular rate and rhythm without murmur  Abdomen: Obese, nondistended, soft, bowel sounds positive, no rebound Extremities: No significant cyanosis, or clubbing - 2+ edema bilateral lower extremities into thighs  CBC:  Recent Labs Lab 10/05/15 1150 10/06/15 0510 10/06/15 1447 10/07/15 0308 10/08/15 0544  WBC 10.9* 10.6* 10.0 8.4 9.3  NEUTROABS  --  9.6*  --   --   --   HGB 5.8* 9.8* 8.2* 7.9* 8.1*  HCT 23.2* 33.4* 28.8* 27.6* 28.3*  MCV 72.7* 75.7* 74.8* 74.8* 74.7*  PLT 302 279 265 252 0000000   Basic Metabolic Panel:  Recent Labs Lab 10/05/15 1150 10/05/15 1538 10/06/15 0510 10/07/15 0308 10/08/15 0544  NA 135  --  140 137 138  K 4.7  --  4.9 4.1 3.4*  CL 109  --  109 107 103  CO2 16*  --  16* 20* 22  GLUCOSE 108*  --  86 145* 138*  BUN 52*  --  55* 57* 53*  CREATININE 2.68*  --  2.66* 2.68* 2.44*  CALCIUM 8.4*  --  8.7* 8.0* 8.3*  MG  --  1.7 1.6* 1.7  --    GFR: Estimated Creatinine Clearance: 22.9 mL/min (by C-G formula based on Cr of 2.44).  Liver Function Tests:  Recent Labs Lab 10/05/15 1150 10/06/15 0510 10/07/15 0308 10/08/15 0544  AST 18 21 16  14*  ALT 14 17 14  13*  ALKPHOS 79 91 70 68  BILITOT 0.9 2.7* 1.4* 0.9  PROT 6.0* 6.3* 5.4* 5.2*  ALBUMIN 3.0* 3.2* 2.6* 2.5*   Coagulation Profile:  Recent Labs Lab 10/04/15 10/05/15 1150 10/06/15 0510 10/07/15 0308 10/08/15 0639  INR 6.7 5.13* 1.48 1.49 1.41    Cardiac Enzymes:  Recent Labs Lab 10/05/15 1538 10/05/15 2120 10/06/15 0510  TROPONINI 0.06* 0.03 0.03   CBG:  Recent Labs Lab 10/06/15 0801 10/06/15 1146 10/06/15 1558 10/06/15 2041 10/07/15 0034  GLUCAP 90 80 116* 149* 123*    Recent Results (from the past 240 hour(s))  Urine culture     Status: Abnormal   Collection Time: 10/05/15 12:33 PM  Result Value Ref Range Status   Specimen Description URINE, CATHETERIZED  Final   Special Requests NONE  Final   Culture  (A)  Final    >=100,000 COLONIES/mL GROUP B STREP(S.AGALACTIAE)ISOLATED TESTING AGAINST S. AGALACTIAE NOT ROUTINELY PERFORMED DUE TO PREDICTABILITY OF AMP/PEN/VAN SUSCEPTIBILITY.    Report Status 10/06/2015 FINAL  Final  MRSA PCR Screening     Status: None   Collection Time: 10/05/15  5:38 PM  Result Value Ref Range Status   MRSA by PCR NEGATIVE NEGATIVE Final    Comment:  The GeneXpert MRSA Assay (FDA approved for NASAL specimens only), is one component of a comprehensive MRSA colonization surveillance program. It is not intended to diagnose MRSA infection nor to guide or monitor treatment for MRSA infections.      Scheduled Meds: . allopurinol  100 mg Oral Daily  . antiseptic oral rinse  7 mL Mouth Rinse q12n4p  . aspirin EC  81 mg Oral Daily  . brimonidine  1 drop Both Eyes BID  . bumetanide  2 mg Intravenous TID  . buPROPion  150 mg Oral Daily  . carvedilol  12.5 mg Oral BID WC  . cefTRIAXone (ROCEPHIN)  IV  1 g Intravenous Q24H  . chlorhexidine  15 mL Mouth Rinse BID  . cholecalciferol  1,000 Units Oral BID  . hydrALAZINE  50 mg Oral TID  . isosorbide mononitrate  60 mg Oral Daily  . levothyroxine  137 mcg Oral QAC breakfast  . mometasone-formoterol  2 puff Inhalation BID  . morphine      . pantoprazole  40 mg Oral BID  . predniSONE  5 mg Oral Q breakfast  . timolol  1 drop Both Eyes BID   Continuous Infusions: . sodium chloride 10 mL/hr at 10/06/15 0930     LOS: 3 days   Time spent: 35 minutes   Cherene Altes, MD [facility] Office  [phone] Pager - Text Page per Shea Evans as per below:  On-Call/Text Page:      [email or web address]      password [email or web address]  If 7PM-7AM, please contact night-coverage [email or web address] Password [facility] 10/08/2015, 4:28 PM

## 2015-10-08 NOTE — Care Management Important Message (Signed)
Important Message  Patient Details  Name: Wendy Cole MRN: [id number] Date of Birth: 10-16-1935   Medicare Important Message Given:  Yes    Holt Woolbright T, RN 10/08/2015, 11:02 AM

## 2015-10-08 NOTE — Progress Notes (Signed)
Pt demanded the BiPAP be removed. RN removed BiPAP and placed 5L Nasal cannula. O2 saturation at 100% and no distress noted. Will continue to monitor.   Hart Rochester, RN, BSN

## 2015-10-08 NOTE — Progress Notes (Signed)
*  PRELIMINARY RESULTS* Vascular Ultrasound Lower extremity venous duplex has been completed.  Preliminary findings: No evidence of DVT or baker's cyst.  Patient was in a lot of pain and verbalized for me to stop multiple times. RN was in room and kept encouraging patient to continue with test through pain. Paid meds were given without much help to patient. Was able to complete test, but patient was in much pain and thoroughly unhappy.   Landry Mellow, RDMS, RVT  10/08/2015, 2:55 PM

## 2015-10-09 DIAGNOSIS — N183 Chronic kidney disease, stage 3 unspecified: Secondary | ICD-10-CM | POA: Diagnosis present

## 2015-10-09 DIAGNOSIS — N390 Urinary tract infection, site not specified: Secondary | ICD-10-CM | POA: Diagnosis present

## 2015-10-09 DIAGNOSIS — I82409 Acute embolism and thrombosis of unspecified deep veins of unspecified lower extremity: Secondary | ICD-10-CM

## 2015-10-09 DIAGNOSIS — E118 Type 2 diabetes mellitus with unspecified complications: Secondary | ICD-10-CM | POA: Diagnosis present

## 2015-10-09 LAB — TYPE AND SCREEN
ABO/RH(D): O NEG
Antibody Screen: POSITIVE
DAT, IgG: NEGATIVE
Donor AG Type: NEGATIVE
Donor AG Type: NEGATIVE
Donor AG Type: NEGATIVE
Donor AG Type: NEGATIVE
Donor AG Type: NEGATIVE
Donor AG Type: NEGATIVE
UNIT DIVISION: 0
Unit division: 0
Unit division: 0
Unit division: 0
Unit division: 0
Unit division: 0

## 2015-10-09 LAB — BASIC METABOLIC PANEL
Anion gap: 13 (ref 5–15)
BUN: 50 mg/dL — ABNORMAL HIGH (ref 6–20)
CHLORIDE: 100 mmol/L — AB (ref 101–111)
CO2: 25 mmol/L (ref 22–32)
CREATININE: 2.32 mg/dL — AB (ref 0.44–1.00)
Calcium: 8.1 mg/dL — ABNORMAL LOW (ref 8.9–10.3)
GFR calc non Af Amer: 19 mL/min — ABNORMAL LOW (ref >60)
GFR, EST AFRICAN AMERICAN: 22 mL/min — AB (ref >60)
Glucose, Bld: 94 mg/dL (ref 65–99)
POTASSIUM: 3.3 mmol/L — AB (ref 3.5–5.1)
Sodium: 138 mmol/L (ref 135–145)

## 2015-10-09 LAB — CBC
HCT: 29.6 % — ABNORMAL LOW (ref 36.0–46.0)
Hemoglobin: 8.3 g/dL — ABNORMAL LOW (ref 12.0–15.0)
MCH: 21.1 pg — ABNORMAL LOW (ref 26.0–34.0)
MCHC: 28 g/dL — ABNORMAL LOW (ref 30.0–36.0)
MCV: 75.3 fL — ABNORMAL LOW (ref 78.0–100.0)
Platelets: 254 K/uL (ref 150–400)
RBC: 3.93 MIL/uL (ref 3.87–5.11)
RDW: 21.1 % — ABNORMAL HIGH (ref 11.5–15.5)
WBC: 10.1 K/uL (ref 4.0–10.5)

## 2015-10-09 LAB — PROTIME-INR
INR: 1.37 (ref 0.00–1.49)
Prothrombin Time: 17 seconds — ABNORMAL HIGH (ref 11.6–15.2)

## 2015-10-09 LAB — MAGNESIUM: Magnesium: 1.9 mg/dL (ref 1.7–2.4)

## 2015-10-09 LAB — POTASSIUM: POTASSIUM: 3.9 mmol/L (ref 3.5–5.1)

## 2015-10-09 MED ORDER — MAGNESIUM SULFATE 2 GM/50ML IV SOLN
2.0000 g | Freq: Once | INTRAVENOUS | Status: AC
  Administered 2015-10-09: 2 g via INTRAVENOUS
  Filled 2015-10-09: qty 50

## 2015-10-09 MED ORDER — OXYCODONE-ACETAMINOPHEN 7.5-325 MG PO TABS
1.0000 | ORAL_TABLET | Freq: Four times a day (QID) | ORAL | Status: DC | PRN
  Administered 2015-10-09 – 2015-10-11 (×3): 1 via ORAL
  Filled 2015-10-09 (×4): qty 1

## 2015-10-09 MED ORDER — POTASSIUM CHLORIDE CRYS ER 20 MEQ PO TBCR
50.0000 meq | EXTENDED_RELEASE_TABLET | Freq: Once | ORAL | Status: AC
  Administered 2015-10-09: 50 meq via ORAL
  Filled 2015-10-09: qty 2

## 2015-10-09 NOTE — Progress Notes (Signed)
PROGRESS NOTE    Wendy Cole  [id number] DOB: Oct 08, 1935 DOA: 10/05/2015 PCP: Walker Kehr, MD   Brief Narrative:  80 y.o. WF PMHx Anxiety, Depression,CVA, CAD native artery, Paroxysmal Atrial Fibrillation on Warfarin Rx, Chronic Diastolic CHF/Cardiomyopathy, NSTEMI, HTN, Stage III CKD, Hypothyroid, OSA and hx of Breast Cancer S/P Rt. Mastectomy HLD, DVT,   Who presents to the ED , presents to the Emergency Department today complaining of BLE swelling and shortness of breath. Noted increasing weakness since DC from hospital on 07-30-15 due to syncopal episode. Seen Neurology on 09-05-15 for follow up with no neurologic cause. Pt notes inability to ambulate due to weakness and called EMS as she was unable to get off of the toilet. Started using wheelchair in the past week due to weakness. Pt also notes right shoulder pain s/p mechanical fall earlier this week with no head trauma. Notes pain is 5/10 and aching. Pt has Chest tightness with SOB. No CP. No N/V/D. No headaches. No fevers. No hematochezia. No black tarry stools. No other symptoms noted.  Occult blood positive In ED. Patient has not noticed blood in urine or Stool.states allergic to Lasix will cause her extreme muscle spasms ~ 30-45 minutes post administration..states baseline weight is ~218 pounds(99 kg). States does not weigh herself daily   Assessment & Plan:   Active Problems:   Essential hypertension   History of DVT (deep vein thrombosis)   OSA on CPAP   Anemia   CKD (chronic kidney disease) stage 3, GFR 30-59 ml/min   Stroke (HCC)   Chronic anticoagulation   Hypothyroidism   Chronic venous embolism and thrombosis of deep vessels of lower extremity (HCC)   Supratherapeutic INR   Lower GI bleed   Diabetes mellitus type 2, uncontrolled, with complications (HCC)   Anxiety   Depression   CAD in native artery   Paroxysmal atrial fibrillation (HCC)   Chronic diastolic CHF (congestive heart failure) (HCC)  Cardiomyopathy (HCC)   HLD (hyperlipidemia)   CHF, acute on chronic (HCC)   Bleeding gastrointestinal   Urinary tract infection, site not specified   Controlled diabetes mellitus type 2 with complications (HCC)   Chronic kidney disease, stage III (moderate)  Staph Agalactiae UTI  -Complete 5 day course Rocephin   Lower GI bleed -On admissions patient's hemoglobin 5.8, positive occult blood -5/13 transfuse 3 units PRBC -GI consulted; secondary to patient's poorly controlled respiratory/cardiac status GI deferred colonoscopy or EGD at this time. Will follow from a distance. -Stable hemoglobin most likely secondary to supratherapeutic INR  Supratherapeutic INR -INR = 6.7 at admission; Vitamin K 10 mg 1 - 5/17 WNL  Recent Labs Lab 10/06/15 1447 10/07/15 0308 10/08/15 0544 10/09/15 0433  HGB 8.2* 7.9* 8.1* 8.3*  ]   Chronic diastolic CHF/Cardiomyopathy -Echocardiogram; confirms dilated cardiomyopathy, diastolic CHF see echocardiogram below -Fluid overloaded (edematous to hips) but significantly improved from admission. -strict in and out since admission -10.4 L -Daily weight Filed Weights   10/07/15 0343 10/08/15 0400 10/09/15 0500  Weight: 97.659 kg (215 lb 4.8 oz) 99.1 kg (218 lb 7.6 oz) 99.5 kg (219 lb 5.7 oz)  -Coreg 12.5 mg BID -Hydralazine 50 mg TID -Imdur 60 mg daily -Bumex 2 mg IV TID -Out of bed to chair q shift  Paroxysmal Atrial fibrillation -A. fib, rate controlled  -See cardiomyopathy -Coumadin on hold, will discuss with family if we should restart Coumadin given patient's wish to consume food as she likes.  OSA -CPAP per respiratory therapy  DM  type 2 Controlled with complications -[id number] <4.3  HLD -Lipid panel within ADA guidelines   CKD stage III (Baseline~1.82-2.12) Lab Results  Component Value Date   CREATININE 2.32* 10/09/2015   CREATININE 2.44* 10/08/2015   CREATININE 2.68* 10/07/2015  -improving with  diuresis  Hypothyroidism -Continue Synthroid 137 g daily -TSH; 1.89  DVT -Patient had been on Coumadin unfortunately with GI bleed will hold  Anxiety -Clorazepate DC'd   Depression -Continue Wellbutrin 150 mg daily  Hypokalemia -Potassium goal> 4 -K Dur 50 mEq -Repeat potassium level 1300  Hypomagnesemia -Magnesium goal> 2 -Magnesium IV 2 gm  Goals of care -Spoke at length with patient and son concerning goals of care patient verified that she should be DO NOT RESUSCITATE. -Patient also knowledge that she ate hamburgers, french fries, diet Cokes at home which is what made her happy. Spoke at length with patient and son concerning what her wishes would be upon discharge to include possible palliative care. Patient will discuss with family    DVT prophylaxis: N/A secondary to GI bleed Code Status: DO NOT RESUSCITATE Family Communication: Spoke with son Disposition Plan: Resolution acute on chronic diastolic CHF/GI bleed   Consultants:  Dr.William Stacie [facility]   Procedures/Significant Events:  PICC 5/13 5/14 echocardiogram;- Left ventricle:-50% to 55%. -(grade 2 diastolic dysfunction). - Aortic valve: moderately restricted. mild to moderate regurgitation - Left atrium: moderately dilated.- Right ventricle: moderately dilated. -Right atrium: moderately dilated. -Tricuspid valve: moderate regurgitation. - Pulmonary arteries: PA peak pressure: 57 mm Hg (S). 5/16 bilateral lower extremity Doppler; negative DVT  Cultures   Antimicrobials: Ceftriaxone 5/14>>   Devices    LINES / TUBES:      Continuous Infusions: . sodium chloride 10 mL/hr at 10/06/15 0930     Subjective: 5/17 A/O 4, complains of back ache secondary to being in bed would like to be out at least to chair. Acute on chronic lower extremity pain.  Objective: Filed Vitals:   10/09/15 1600 10/09/15 1838 10/09/15 1900 10/09/15 1947  BP: 139/38 156/64 164/58   Pulse: 56  59 59  Temp:  97.5 F (36.4 C)  97.5 F (36.4 C)   TempSrc: Axillary  Oral   Resp: 18  16 16   Height:      Weight:      SpO2: 100%  100% 100%    Intake/Output Summary (Last 24 hours) at 10/09/15 1956 Last data filed at 10/09/15 1624  Gross per 24 hour  Intake    480 ml  Output   3925 ml  Net  -3445 ml   Filed Weights   10/07/15 0343 10/08/15 0400 10/09/15 0500  Weight: 97.659 kg (215 lb 4.8 oz) 99.1 kg (218 lb 7.6 oz) 99.5 kg (219 lb 5.7 oz)    Examination:  General: A/O 4, No acute respiratory distress Eyes: negative scleral hemorrhage, negative anisocoria, negative icterus ENT: Negative Runny nose, negative gingival bleeding, Neck:  Negative scars, masses, torticollis, lymphadenopathy, JVD Lungs: Clear to auscultation bilaterally without wheezes or crackles Cardiovascular: Regular rate and rhythm without murmur gallop or rub normal S1 and S2 Abdomen: Morbidly obese, negative abdominal pain, nondistended, positive soft, bowel sounds, no rebound, no ascites, no appreciable mass Extremities: No significant cyanosis, clubbing, bilateral pitting edema 2+ mid thigh Skin: Negative rashes, lesions, ulcers Psychiatric:  Poor understanding of disease process Central nervous system:  Cranial nerves II through XII intact, tongue/uvula midline, all extremities muscle strength 5/5, sensation intact throughout,  negative dysarthria, negative expressive aphasia, negative receptive aphasia.  [name]  Data Reviewed: Care during the described time interval was provided by me .  I have reviewed this patient's available data, including medical history, events of note, physical examination, and all test results as part of my evaluation. I have personally reviewed and interpreted all radiology studies.  CBC:  Recent Labs Lab 10/06/15 0510 10/06/15 1447 10/07/15 0308 10/08/15 0544 10/09/15 0433  WBC 10.6* 10.0 8.4 9.3 10.1  NEUTROABS 9.6*  --   --   --   --   HGB 9.8* 8.2* 7.9* 8.1* 8.3*  HCT 33.4*  28.8* 27.6* 28.3* 29.6*  MCV 75.7* 74.8* 74.8* 74.7* 75.3*  PLT 279 265 252 246 0000000   Basic Metabolic Panel:  Recent Labs Lab 10/05/15 1150 10/05/15 1538 10/06/15 0510 10/07/15 0308 10/08/15 0544 10/09/15 0433 10/09/15 1351  NA 135  --  140 137 138 138  --   K 4.7  --  4.9 4.1 3.4* 3.3* 3.9  CL 109  --  109 107 103 100*  --   CO2 16*  --  16* 20* 22 25  --   GLUCOSE 108*  --  86 145* 138* 94  --   BUN 52*  --  55* 57* 53* 50*  --   CREATININE 2.68*  --  2.66* 2.68* 2.44* 2.32*  --   CALCIUM 8.4*  --  8.7* 8.0* 8.3* 8.1*  --   MG  --  1.7 1.6* 1.7  --   --  1.9   GFR: Estimated Creatinine Clearance: 24.2 mL/min (by C-G formula based on Cr of 2.32). Liver Function Tests:  Recent Labs Lab 10/05/15 1150 10/06/15 0510 10/07/15 0308 10/08/15 0544  AST 18 21 16  14*  ALT 14 17 14  13*  ALKPHOS 79 91 70 68  BILITOT 0.9 2.7* 1.4* 0.9  PROT 6.0* 6.3* 5.4* 5.2*  ALBUMIN 3.0* 3.2* 2.6* 2.5*   No results for input(s): LIPASE, AMYLASE in the last 168 hours. No results for input(s): AMMONIA in the last 168 hours. Coagulation Profile:  Recent Labs Lab 10/05/15 1150 10/06/15 0510 10/07/15 0308 10/08/15 0639 10/09/15 0433  INR 5.13* 1.48 1.49 1.41 1.37   Cardiac Enzymes:  Recent Labs Lab 10/05/15 1538 10/05/15 2120 10/06/15 0510  TROPONINI 0.06* 0.03 0.03   BNP (last 3 results)  Recent Labs  07/22/15 1503  PROBNP >5000.0*   HbA1C: No results for input(s): HGBA1C in the last 72 hours. CBG:  Recent Labs Lab 10/06/15 0801 10/06/15 1146 10/06/15 1558 10/06/15 2041 10/07/15 0034  GLUCAP 90 80 116* 149* 123*   Lipid Profile: No results for input(s): CHOL, HDL, LDLCALC, TRIG, CHOLHDL, LDLDIRECT in the last 72 hours. Thyroid Function Tests: No results for input(s): TSH, T4TOTAL, FREET4, T3FREE, THYROIDAB in the last 72 hours. Anemia Panel: No results for input(s): VITAMINB12, FOLATE, FERRITIN, TIBC, IRON, RETICCTPCT in the last 72 hours. Urine analysis:     Component Value Date/Time   COLORURINE YELLOW 10/05/2015 1233   APPEARANCEUR TURBID* 10/05/2015 1233   LABSPEC 1.019 10/05/2015 1233   LABSPEC 1.010 06/21/2013 1213   PHURINE 5.0 10/05/2015 1233   PHURINE 6.0 06/21/2013 1213   GLUCOSEU NEGATIVE 10/05/2015 [place] 03/20/2014 1454   GLUCOSEU Negative 06/21/2013 1213   HGBUR LARGE* 10/05/2015 1233   HGBUR Negative 06/21/2013 [place] 10/05/2015 1233   BILIRUBINUR Negative 06/21/2013 [place] 10/05/2015 1233   KETONESUR Negative 06/21/2013 1213   PROTEINUR 100* 10/05/2015 1233   PROTEINUR 100 06/21/2013 1213  UROBILINOGEN 0.2 03/20/2014 1454   UROBILINOGEN 0.2 06/21/2013 1213   NITRITE NEGATIVE 10/05/2015 1233   NITRITE Negative 06/21/2013 1213   LEUKOCYTESUR LARGE* 10/05/2015 1233   LEUKOCYTESUR Negative 06/21/2013 1213   Sepsis Labs: @LABRCNTIP (procalcitonin:4,lacticidven:4)  ) Recent Results (from the past 240 hour(s))  Urine culture     Status: Abnormal   Collection Time: 10/05/15 12:33 PM  Result Value Ref Range Status   Specimen Description URINE, CATHETERIZED  Final   Special Requests NONE  Final   Culture (A)  Final    >=100,000 COLONIES/mL GROUP B STREP(S.AGALACTIAE)ISOLATED TESTING AGAINST S. AGALACTIAE NOT ROUTINELY PERFORMED DUE TO PREDICTABILITY OF AMP/PEN/VAN SUSCEPTIBILITY.    Report Status 10/06/2015 FINAL  Final  MRSA PCR Screening     Status: None   Collection Time: 10/05/15  5:38 PM  Result Value Ref Range Status   MRSA by PCR NEGATIVE NEGATIVE Final    Comment:        The GeneXpert MRSA Assay (FDA approved for NASAL specimens only), is one component of a comprehensive MRSA colonization surveillance program. It is not intended to diagnose MRSA infection nor to guide or monitor treatment for MRSA infections.          Radiology Studies: No results found.      Scheduled Meds: . allopurinol  100 mg Oral Daily  . antiseptic oral  rinse  7 mL Mouth Rinse q12n4p  . aspirin EC  81 mg Oral Daily  . brimonidine  1 drop Both Eyes BID  . bumetanide  2 mg Intravenous TID  . buPROPion  150 mg Oral Daily  . carvedilol  12.5 mg Oral BID WC  . cefTRIAXone (ROCEPHIN)  IV  1 g Intravenous Q24H  . chlorhexidine  15 mL Mouth Rinse BID  . cholecalciferol  1,000 Units Oral BID  . hydrALAZINE  50 mg Oral TID  . isosorbide mononitrate  60 mg Oral Daily  . levothyroxine  137 mcg Oral QAC breakfast  . mometasone-formoterol  2 puff Inhalation BID  . pantoprazole  40 mg Oral BID  . predniSONE  5 mg Oral Q breakfast  . timolol  1 drop Both Eyes BID   Continuous Infusions: . sodium chloride 10 mL/hr at 10/06/15 0930     LOS: 4 days    Time spent: 40 minutes    Charrie Mcconnon, Geraldo Docker, MD [facility] Pager [phone]   If 7PM-7AM, please contact night-coverage [email or web address] Password [facility] 10/09/2015, 7:56 PM

## 2015-10-09 NOTE — Evaluation (Signed)
Occupational Therapy Evaluation Patient Details Name: Wendy Cole MRN: [id number] DOB: 01-19-1936 Today's Date: 10/09/2015    History of Present Illness this 80 y.o. female was brought to ED when she was unable to get up from the toilet.  In ED pt was grossly volume overloaded and had Guiac + stool.  Hgb 5.8 and INR 5.8.  Dx: UTI, diastolic HF exacerbation, GI bleed, OSA, paroxysmal A-Fib, toxic metabolic encephalopathy.  PMH includes:  Anxiety/depression, A-Fib, chronic severe diastolic CHF, HTN, stage III CKD, h/o DVT, OSA, Breast CA, s/p Rt mastectomy.    Clinical Impression   Pt admitted with above. She demonstrates the below listed deficits and will benefit from continued OT to maximize safety and independence with BADLs.  Pt presents to OT with generalized weakness, impaired balance, pain, and cognitive deficits.  Pt moves slowly and requires max encouragement to participate.  She requires +2 max A for bed mobility, and mod A +2 to pivot to chair.  She requires max - total A for ADLs.  She will require 24 hour physical assist at discharge.  Husband has AKA and is unable to provide physical assist at discharge.  Feel SNF is best recommendation at discharge.       Follow Up Recommendations  SNF;Supervision/Assistance - 24 hour    Equipment Recommendations  3 in 1 bedside comode;Hospital bed    Recommendations for Other Services       Precautions / Restrictions Precautions Precautions: Fall      Mobility Bed Mobility Overal bed mobility: Needs Assistance;+2 for physical assistance Bed Mobility: Supine to Sit     Supine to sit: Max assist;+2 for physical assistance     General bed mobility comments: Pt requires max coaxing and encouragement for OOB.  Pt with complaint of skin hurting on bil. LEs.  LEs were supported on pillows to move to EOB.  Pt able to assist minimally with LEs and able to move trunk to sitting position with mod A.   Transfers Overall transfer  level: Needs assistance   Transfers: Sit to/from [employer] Transfers Sit to Stand: Mod assist;+2 physical assistance   Squat pivot transfers: Mod assist;+2 physical assistance     General transfer comment: Pt stood x 3 with increased time and mod A +2 to move into standing.  She requires facilitation to achieve full hip extension, but does not maintain it.  Twice, pt stated her legs hurt to much and abruptly sat down.  On third attempt, she was able to pivot to chair.  required max verbal cues for technique and sequencing, and mod A + 2 to maintain balance and to advance LEs     Balance Overall balance assessment: Needs assistance Sitting-balance support: Feet supported;Bilateral upper extremity supported Sitting balance-Leahy Scale: Poor Sitting balance - Comments: Initially, when LEs were not supported on floor, pt required max A to maintain EOB sitting.  Once she scooted to EOB with feet on floor, she was able to maintain with min guard assist    Standing balance support: Bilateral upper extremity supported Standing balance-Leahy Scale: Poor Standing balance comment: Pt requirse mod A +2 to matain standing for short periods of time                             ADL Overall ADL's : Needs assistance/impaired Eating/Feeding: Set up;Bed level   Grooming: Wash/dry hands;Wash/dry face;Oral care;Brushing hair;Minimal assistance;Sitting   Upper Body Bathing: Moderate assistance;Sitting  Lower Body Bathing: Total assistance;Sit to/from stand   Upper Body Dressing : Maximal assistance;Sitting   Lower Body Dressing: Total assistance;Sit to/from stand   Toilet Transfer: Moderate assistance;+2 for physical assistance;Squat-pivot   Toileting- Clothing Manipulation and Hygiene: Total assistance;Sit to/from stand       Functional mobility during ADLs: Moderate assistance;+2 for physical assistance General ADL Comments: Pt requires significant amount of encouragement to  particiapte      Vision     Perception     Praxis      Pertinent Vitals/Pain Pain Assessment: Faces Faces Pain Scale: Hurts even more Pain Location: skin bil. LEs, and "all over"  Pain Descriptors / Indicators: Burning;Grimacing;Guarding;Sore     Hand Dominance Right   Extremity/Trunk Assessment Upper Extremity Assessment Upper Extremity Assessment: Generalized weakness   Lower Extremity Assessment Lower Extremity Assessment: Defer to PT evaluation       Communication Communication Communication: No difficulties   Cognition Arousal/Alertness: Awake/alert Behavior During Therapy: WFL for tasks assessed/performed Overall Cognitive Status: Impaired/Different from baseline (unsure of baseline, as spouse does not provide input ) Area of Impairment: Orientation;Attention;Memory;Safety/judgement;Problem solving Orientation Level: Disoriented to;Situation;Time Current Attention Level: Sustained Memory: Decreased short-term memory   Safety/Judgement: Decreased awareness of safety;Decreased awareness of deficits   Problem Solving: Difficulty sequencing;Requires verbal cues General Comments: Pt with difficulty generalizing he current status/performance to what it is she needs to be able to do at home.  She frequently asks for info to be repeated.  She has poor awareness of deficits and impact on safety, poor judgement.  Unsure if this is her baseline.  If spouse attempted to provide info, pt yelled at [place] expects to be discharged to:: Private residence Living Arrangements: Spouse/significant other;Children Available Help at Discharge: Family;Available PRN/intermittently Type of Home: House Home Access: Stairs to enter [employer] of Steps: 1 Entrance Stairs-Rails: None Home Layout: One level     Bathroom Shower/Tub: Occupational psychologist:  Standard     Home Equipment: Environmental consultant - 2 wheels;Cane - single point;Shower seat - built in;Grab bars - toilet;Grab bars - tub/shower   Additional Comments: Spouse has Rt AKA and is unable to physically assist her.  Son lives with them but works 10 hour days M-Th      Prior Functioning/Environment Level of Independence: Needs Product/process development scientist / Transfers Assistance Needed: Pt was transferring to w/c with assist ~60% of time, and on her good days, she was ambulating to BR with RW  ADL's / Homemaking Assistance Needed: Pt states she was doing her ADL mod I, but is very defensive when answering questions.  Spouse indicated this was not accurate, but pt began yelling at him when he attempted to provide information         OT Diagnosis: Generalized weakness;Cognitive deficits;Acute pain   OT Problem List: Decreased strength;Decreased activity tolerance;Impaired balance (sitting and/or standing);Decreased safety awareness;Decreased knowledge of use of DME or AE;Decreased cognition;Cardiopulmonary status limiting activity;Obesity;Pain   OT Treatment/Interventions: Self-care/ADL training;Therapeutic exercise;DME and/or AE instruction;Therapeutic activities;Cognitive remediation/compensation;Patient/family education;Balance training    OT Goals(Current goals can be found in the care plan section) Acute Rehab OT Goals Patient Stated Goal: Pt states she wants to go home  ADL Goals Pt Will Perform Upper Body Bathing: with supervision;sitting Pt Will Perform Lower Body Bathing: with min assist;sit to/from stand;with adaptive equipment  Pt Will Transfer to Toilet: with min assist;with +2 assist;stand pivot transfer;bedside commode Pt Will Perform Toileting - Clothing Manipulation and hygiene: with min assist;sit to/from stand Pt/caregiver will Perform Home Exercise Program: Increased strength;Right Upper extremity;Left upper extremity;With Supervision  OT Frequency: Min 2X/week   Barriers to D/C:  Decreased caregiver support          Co-evaluation              End of Session Equipment Utilized During Treatment: Oxygen;Rolling walker Nurse Communication: Mobility status;Need for lift equipment  Activity Tolerance: Patient limited by pain Patient left: in chair;with call bell/phone within reach;with chair alarm set;with family/visitor present   Time: 1355-1455 OT Time Calculation (min): 60 min Charges:  OT General Charges $OT Visit: 1 Procedure OT Evaluation $OT Eval Moderate Complexity: 1 Procedure OT Treatments $Therapeutic Activity: 8-22 mins G-Codes:    Rayshon Albaugh M 11/04/15, 3:47 PM

## 2015-10-09 NOTE — Evaluation (Signed)
Physical Therapy Evaluation Patient Details Name: Wendy Cole MRN: [id number] DOB: 1935-08-17 Today's Date: 10/09/2015   History of Present Illness  This 80 y.o. female was brought to ED when she was unable to get up from the toilet.  In ED pt was grossly volume overloaded and had Guiac + stool.  Hgb 5.8 and INR 5.8.  Dx: UTI, diastolic HF exacerbation, GI bleed, OSA, paroxysmal A-Fib, toxic metabolic encephalopathy.  PMH includes:  Anxiety/depression, A-Fib, chronic severe diastolic CHF, HTN, stage III CKD, h/o DVT, OSA, Breast CA, s/p Rt mastectomy.     Clinical Impression  Pt admitted with above diagnosis. Pt currently with functional limitations due to the deficits listed below (see PT Problem List). Wendy Cole presents w/ generalized weakness, impaired cognition, and decreased safety awareness.  She currently requires max +2 assist for bed mobility and mod +2 assist for stand pivot to chair. She will need 24/7 assist/supervision at d/c which is not available to her at home.  Given pt's functional limitation and limited assist available, recommending SNF at d/c.  Pt will benefit from skilled PT to increase their independence and safety with mobility to allow discharge to the venue listed below.      Follow Up Recommendations SNF;Supervision/Assistance - 24 hour    Equipment Recommendations  Other (comment) (TBD at next venue of care)    Recommendations for Other Services       Precautions / Restrictions Precautions Precautions: Fall Restrictions Weight Bearing Restrictions: No      Mobility  Bed Mobility Overal bed mobility: Needs Assistance;+2 for physical assistance Bed Mobility: Supine to Sit     Supine to sit: Max assist;+2 for physical assistance     General bed mobility comments: Pt requires max coaxing and encouragement for OOB.  Pt with complaint of skin hurting on bil. LEs.  LEs were supported on pillows to move to EOB.  Pt able to assist minimally with LEs and able  to move trunk to sitting position with mod A.   Transfers Overall transfer level: Needs assistance Equipment used: 2 person hand held assist Transfers: Sit to/from [facility] Sit to Stand: Mod assist;+2 physical assistance   Squat pivot transfers: Mod assist;+2 physical assistance     General transfer comment: Pt stood x 3 with increased time and mod A +2 to move into standing.  She requires facilitation to achieve full hip extension, but does not maintain it.  Twice, pt stated her legs hurt to much and abruptly sat down.  On third attempt, she was able to pivot to chair.  required max verbal cues for technique and sequencing, and mod A + 2 to maintain balance and to advance LEs   Ambulation/Gait             General Gait Details: did not attempt for pt/therapist safety  Stairs            Wheelchair Mobility    Modified Rankin (Stroke Patients Only)       Balance Overall balance assessment: Needs assistance Sitting-balance support: Feet supported;Bilateral upper extremity supported Sitting balance-Leahy Scale: Poor Sitting balance - Comments: Initially, when LEs were not supported on floor, pt required max A to maintain EOB sitting.  Once she scooted to EOB with feet on floor, she was able to maintain with min guard assist    Standing balance support: Bilateral upper extremity supported;During functional activity Standing balance-Leahy Scale: Poor Standing balance comment: Pt requirse mod A +2 to maintain standing for short  periods of time                              Pertinent Vitals/Pain Pain Assessment: Faces Faces Pain Scale: Hurts even more Pain Location: Skin Bil LEs and "all over" Pain Descriptors / Indicators: Burning;Grimacing;Guarding;Sore Pain Intervention(s): Limited activity within patient's tolerance;Monitored during session;Repositioned    Home Living Family/patient expects to be discharged to:: Private  residence Living Arrangements: Spouse/significant other;Children Available Help at Discharge: Family;Available PRN/intermittently Type of Home: House Home Access: Stairs to enter Entrance Stairs-Rails: None Entrance Stairs-Number of Steps: 1 Home Layout: One level Home Equipment: Walker - 2 wheels;Cane - single point;Shower seat - built in;Grab bars - toilet;Grab bars - tub/shower Additional Comments: Spouse has Rt AKA and is unable to physically assist her.  Son lives with them but works 10 hour days M-Th    Prior Function Level of Independence: Needs assistance   Gait / Transfers Assistance Needed: Pt was transferring to w/c with assist ~60% of time, and on her good days, she was ambulating to bathroom with RW   ADL's / Homemaking Assistance Needed: Pt states she was doing her ADL mod I, but is very defensive when answering questions.  Spouse indicated this was not accurate, but pt began yelling at him when he attempted to provide information         Hand Dominance   Dominant Hand: Right    Extremity/Trunk Assessment   Upper Extremity Assessment: Defer to OT evaluation           Lower Extremity Assessment: Generalized weakness (hyperalgesia Bil LEs)         Communication   Communication: No difficulties  Cognition Arousal/Alertness: Awake/alert Behavior During Therapy: WFL for tasks assessed/performed Overall Cognitive Status: Impaired/Different from baseline (unsure of baseline, as spouse does not provide input ) Area of Impairment: Orientation;Attention;Memory;Safety/judgement;Problem solving Orientation Level: Disoriented to;Situation;Time Current Attention Level: Sustained Memory: Decreased short-term memory   Safety/Judgement: Decreased awareness of safety;Decreased awareness of deficits   Problem Solving: Difficulty sequencing;Requires verbal cues General Comments: Pt with difficulty generalizing he current status/performance to what it is she needs to be  able to do at home.  She frequently asks for info to be repeated.  She has poor awareness of deficits and impact on safety, poor judgement.  Unsure if this is her baseline.  If spouse attempted to provide info, pt yelled at him.    General Comments General comments (skin integrity, edema, etc.): spouse present during evals.  VSS     Exercises        Assessment/Plan    PT Assessment Patient needs continued PT services  PT Diagnosis Difficulty walking;Generalized weakness;Acute pain   PT Problem List Decreased strength;Decreased activity tolerance;Decreased balance;Decreased mobility;Decreased cognition;Decreased knowledge of use of DME;Decreased safety awareness;Cardiopulmonary status limiting activity;Pain  PT Treatment Interventions DME instruction;Gait training;Stair training;Functional mobility training;Therapeutic activities;Therapeutic exercise;Balance training;Cognitive remediation;Patient/family education   PT Goals (Current goals can be found in the Care Plan section) Acute Rehab PT Goals Patient Stated Goal: Pt states she wants to go home  PT Goal Formulation: With patient/family Time For Goal Achievement: 10/23/15 Potential to Achieve Goals: Fair    Frequency Min 3X/week   Barriers to discharge Inaccessible home environment;Decreased caregiver support step to enter home, intermittent physical assist but pt requires 24/7    Co-evaluation               End of Session   Activity Tolerance:  Treatment limited secondary to agitation;Patient limited by fatigue;Patient limited by pain Patient left: in chair;with call bell/phone within reach;with chair alarm set Nurse Communication: Mobility status;Need for lift equipment         Time: 1355-1455 PT Time Calculation (min) (ACUTE ONLY): 60 min   Charges:   PT Evaluation $PT Eval Moderate Complexity: 1 Procedure PT Treatments $Therapeutic Activity: 8-22 mins   PT G Codes:       Collie Siad PT, DPT  Pager:  [phone] Phone: [phone] 10/09/2015, 4:56 PM

## 2015-10-10 ENCOUNTER — Inpatient Hospital Stay (HOSPITAL_COMMUNITY): Payer: Medicare Other

## 2015-10-10 LAB — MAGNESIUM
MAGNESIUM: 1.5 mg/dL — AB (ref 1.7–2.4)
Magnesium: 2.3 mg/dL (ref 1.7–2.4)

## 2015-10-10 LAB — COMPREHENSIVE METABOLIC PANEL
ALK PHOS: 66 U/L (ref 38–126)
ALT: 13 U/L — AB (ref 14–54)
AST: 15 U/L (ref 15–41)
Albumin: 2.2 g/dL — ABNORMAL LOW (ref 3.5–5.0)
Anion gap: 11 (ref 5–15)
BUN: 43 mg/dL — AB (ref 6–20)
CALCIUM: 7.6 mg/dL — AB (ref 8.9–10.3)
CHLORIDE: 96 mmol/L — AB (ref 101–111)
CO2: 25 mmol/L (ref 22–32)
CREATININE: 1.96 mg/dL — AB (ref 0.44–1.00)
GFR calc Af Amer: 27 mL/min — ABNORMAL LOW (ref >60)
GFR, EST NON AFRICAN AMERICAN: 23 mL/min — AB (ref >60)
Glucose, Bld: 86 mg/dL (ref 65–99)
Potassium: 3.3 mmol/L — ABNORMAL LOW (ref 3.5–5.1)
Sodium: 132 mmol/L — ABNORMAL LOW (ref 135–145)
Total Bilirubin: 1.2 mg/dL (ref 0.3–1.2)
Total Protein: 4.6 g/dL — ABNORMAL LOW (ref 6.5–8.1)

## 2015-10-10 LAB — POTASSIUM
Potassium: >7.5 mmol/L (ref 3.5–5.1)
Potassium: 4.5 mmol/L (ref 3.5–5.1)

## 2015-10-10 LAB — CBC WITH DIFFERENTIAL/PLATELET
BASOS ABS: 0 10*3/uL (ref 0.0–0.1)
Basophils Relative: 0 %
EOS PCT: 4 %
Eosinophils Absolute: 0.3 10*3/uL (ref 0.0–0.7)
HEMATOCRIT: 28.9 % — AB (ref 36.0–46.0)
Hemoglobin: 8.3 g/dL — ABNORMAL LOW (ref 12.0–15.0)
LYMPHS ABS: 0.4 10*3/uL — AB (ref 0.7–4.0)
LYMPHS PCT: 4 %
MCH: 21.7 pg — ABNORMAL LOW (ref 26.0–34.0)
MCHC: 28.7 g/dL — AB (ref 30.0–36.0)
MCV: 75.5 fL — AB (ref 78.0–100.0)
MONO ABS: 1.1 10*3/uL — AB (ref 0.1–1.0)
Monocytes Relative: 13 %
Neutro Abs: 6.6 10*3/uL (ref 1.7–7.7)
Neutrophils Relative %: 79 %
Platelets: 203 10*3/uL (ref 150–400)
RBC: 3.83 MIL/uL — ABNORMAL LOW (ref 3.87–5.11)
RDW: 21.4 % — AB (ref 11.5–15.5)
WBC: 8.3 10*3/uL (ref 4.0–10.5)

## 2015-10-10 LAB — PROTIME-INR
INR: 1.3 (ref 0.00–1.49)
Prothrombin Time: 16.3 seconds — ABNORMAL HIGH (ref 11.6–15.2)

## 2015-10-10 LAB — TROPONIN I
Troponin I: 0.04 ng/mL — ABNORMAL HIGH (ref <0.031)
Troponin I: <0.03 ng/mL (ref <0.031)

## 2015-10-10 MED ORDER — GABAPENTIN 100 MG PO CAPS
200.0000 mg | ORAL_CAPSULE | Freq: Three times a day (TID) | ORAL | Status: DC
  Administered 2015-10-10 – 2015-10-11 (×3): 200 mg via ORAL
  Filled 2015-10-10 (×3): qty 2

## 2015-10-10 MED ORDER — POTASSIUM CHLORIDE 10 MEQ/100ML IV SOLN
10.0000 meq | INTRAVENOUS | Status: AC
  Administered 2015-10-10 (×5): 10 meq via INTRAVENOUS
  Filled 2015-10-10 (×5): qty 100

## 2015-10-10 MED ORDER — POLYETHYLENE GLYCOL 3350 17 G PO PACK
17.0000 g | PACK | Freq: Every day | ORAL | Status: DC
  Administered 2015-10-10 – 2015-10-30 (×14): 17 g via ORAL
  Filled 2015-10-10 (×17): qty 1

## 2015-10-10 MED ORDER — ONDANSETRON HCL 4 MG/2ML IJ SOLN
4.0000 mg | Freq: Once | INTRAMUSCULAR | Status: AC
  Administered 2015-10-10: 4 mg via INTRAVENOUS
  Filled 2015-10-10: qty 2

## 2015-10-10 MED ORDER — GABAPENTIN 100 MG PO CAPS
100.0000 mg | ORAL_CAPSULE | Freq: Three times a day (TID) | ORAL | Status: DC
  Administered 2015-10-10: 100 mg via ORAL
  Filled 2015-10-10: qty 1

## 2015-10-10 MED ORDER — MAGNESIUM SULFATE 50 % IJ SOLN
3.0000 g | Freq: Once | INTRAVENOUS | Status: AC
  Administered 2015-10-10: 3 g via INTRAVENOUS
  Filled 2015-10-10: qty 6

## 2015-10-10 MED ORDER — HALOPERIDOL LACTATE 5 MG/ML IJ SOLN
5.0000 mg | Freq: Four times a day (QID) | INTRAMUSCULAR | Status: DC | PRN
  Administered 2015-10-10: 5 mg via INTRAVENOUS
  Filled 2015-10-10: qty 1

## 2015-10-10 NOTE — Progress Notes (Signed)
CRITICAL VALUE ALERT  Critical value received:  potassium  Date of notification:  10/10/15  Time of notification:  [id number]  Critical value read back:Yes.    Nurse who received alert:  Red Christians  MD notified (1st page):  C. Sherral Hammers, MD  Time of first page:  1730  MD notified (2nd page):  Time of second page:  Responding MD:  C.Woods,MD  Time MD responded:  [id number]

## 2015-10-10 NOTE — NC FL2 (Signed)
[place] LEVEL OF CARE SCREENING TOOL     IDENTIFICATION  Patient Name: Wendy Cole Birthdate: 30-Nov-1935 Sex: female Admission Date (Current Location): 10/05/2015  [facility] and [place] Number:  Herbalist and Address:  [facility], [place] [address] [place], [place] [address]      Provider Number: [id number]  Attending Physician Name and Address:  Allie Bossier, MD  Relative Name and Phone Number:  Babs Bertin, spouse, [phone]    Current Level of Care: Hospital Recommended Level of Care: [place] Prior Approval Number:    Date Approved/Denied:   PASRR Number:  [id number]  Discharge Plan: SNF    Current Diagnoses: Patient Active Problem List   Diagnosis Date Noted  . Urinary tract infection, site not specified   . Controlled diabetes mellitus type 2 with complications ([place])   . Chronic kidney disease, stage III (moderate)   . Supratherapeutic INR 10/05/2015  . Lower GI bleed 10/05/2015  . Diabetes mellitus type 2, uncontrolled, with complications ([place]) [id number]  . Anxiety 10/05/2015  . Depression 10/05/2015  . CAD in native artery 10/05/2015  . Paroxysmal atrial fibrillation ([place]) 10/05/2015  . Chronic diastolic CHF (congestive heart failure) ([place]) 10/05/2015  . Cardiomyopathy ([place]) 10/05/2015  . HLD (hyperlipidemia) 10/05/2015  . CHF, acute on chronic ([place]) 10/05/2015  . Bleeding gastrointestinal   . Systolic CHF, acute on chronic ([place])   . Syncope and collapse   . Syncope 07/23/2015  . Family history of aneurysm 07/05/2015  . Thoracic back pain 01/21/2015  . Edema 09/21/2014  . Cellulitis of foot, left 08/14/2014  . Acute sinusitis 06/22/2014  . Murmur 02/20/2014  . Hyperglycemia 08/11/2013  . Encounter for therapeutic drug monitoring 06/21/2013  . Leg pain 05/29/2013  . Swelling of limb-Left foot 05/29/2013  . Occlusion and stenosis of carotid artery without mention of cerebral  infarction 05/20/2012  . Chronic venous embolism and thrombosis of deep vessels of lower extremity ([place]) 05/20/2012  . LBP (low back pain) 04/13/2012  . Flash pulmonary edema ([place]) 02/03/2012  . SIRS (systemic inflammatory response syndrome) ([place]) 02/03/2012  . Acute respiratory failure with hypoxia ([place]) 02/03/2012  . Pyelonephritis 02/02/2012  . Fever 02/02/2012  . Iritis   . Chronic anticoagulation   . Obesity   . Cancer ([place])   . HOH (hard of hearing)   . GERD (gastroesophageal reflux disease)   . Hypothyroidism   . Aftercare following surgery of the circulatory system, [place] 01/29/2012  . Hypertension associated with diabetes ([place]) 12/15/2011  . Carotid stenosis, bilateral 11/13/2011  . Chronic systolic CHF (congestive heart failure), NYHA class 3- EF 25-30% 09/15/2011  . Nonischemic cardiomyopathy ([place]) 08/07/2011  . SOB (shortness of breath) 07/13/2011  . PAF (paroxysmal atrial fibrillation) ([place]) 07/13/2011  . Leukocytosis 07/13/2011  . OSA on CPAP   . Anemia   . CKD (chronic kidney disease) stage 3, GFR 30-59 ml/min   . Gout   . Stroke ([place])   . Headache 11/27/2009  . History of DVT (deep vein thrombosis) 01/31/2009  . LIVER FUNCTION TESTS, ABNORMAL, HX OF 12/11/2008  . Essential hypertension 01/17/2008  . APHTHOUS STOMATITIS 01/17/2008  . Lower extremity edema 10/11/2007  . Dyspnea on exertion 08/25/2007  . Chronic kidney disease (CKD), stage IV (severe) ([place]) 07/05/2007  . BUNION 04/28/2007  . Diabetes type 2, controlled ([place]) 03/22/2007  . Dyslipidemia 03/22/2007  . Unspecified Anemia 03/22/2007  . OSTEOARTHRITIS 03/22/2007  . Polymyalgia rheumatica- off steroids  since 2012 03/22/2007    Orientation RESPIRATION BLADDER Height & Weight     Self, Time, Situation, Place  O2 (Nasal cannula 4L/min;CPAP at night-has one at home;) Continent, Indwelling catheter (Urinary catheter) Weight: 99 kg (218 lb 4.1 oz) Height:  5\' 6"  (167.6 cm)  BEHAVIORAL SYMPTOMS/MOOD  NEUROLOGICAL BOWEL NUTRITION STATUS      Incontinent Diet (Please see DC Summary)  AMBULATORY STATUS COMMUNICATION OF NEEDS Skin   Extensive Assist Verbally Normal                       Personal Care Assistance Level of Assistance  Bathing, Feeding, Dressing Bathing Assistance: Limited assistance Feeding assistance: Independent Dressing Assistance: Limited assistance     Functional Limitations Info             SPECIAL CARE FACTORS FREQUENCY  PT (By licensed PT), OT (By licensed OT)     PT Frequency: min 3x/week OT Frequency: min 2x/week            Contractures      Additional Factors Info  Code Status, Allergies, Psychotropic Code Status Info: DNR Allergies Info: Amlodipine, Atorvastatin, Colesevelam, Coumadin, Lasix, Statins, Tape Psychotropic Info: Wellbutrin         Current Medications (10/10/2015):  This is the current hospital active medication list Current Facility-Administered Medications  Medication Dose Route Frequency Provider Last Rate Last Dose  . 0.45 % sodium chloride infusion   Intravenous Continuous Cherene Altes, MD 10 mL/hr at 10/06/15 0930    . acetaminophen (TYLENOL) suppository 650 mg  650 mg Rectal Q4H PRN Cherene Altes, MD   650 mg at 10/06/15 0955  . acetaminophen (TYLENOL) tablet 650 mg  650 mg Oral Q4H PRN Cherene Altes, MD   650 mg at 10/06/15 1650  . albuterol (PROVENTIL) (2.5 MG/3ML) 0.083% nebulizer solution 2.5 mg  2.5 mg Nebulization Q2H PRN Cherene Altes, MD   2.5 mg at 10/06/15 1733  . allopurinol (ZYLOPRIM) tablet 100 mg  100 mg Oral Daily Allie Bossier, MD   100 mg at 10/10/15 0959  . antiseptic oral rinse (CPC / CETYLPYRIDINIUM CHLORIDE 0.05%) solution 7 mL  7 mL Mouth Rinse q12n4p Cherene Altes, MD   7 mL at 10/09/15 1600  . aspirin EC tablet 81 mg  81 mg Oral Daily Allie Bossier, MD   81 mg at 10/10/15 0959  . brimonidine (ALPHAGAN) 0.2 % ophthalmic solution 1 drop  1 drop Both Eyes BID Cherene Altes, MD   1 drop at 10/10/15 1004  . bumetanide (BUMEX) injection 2 mg  2 mg Intravenous TID Cherene Altes, MD   2 mg at 10/10/15 1703  . buPROPion (WELLBUTRIN SR) 12 hr tablet 150 mg  150 mg Oral Daily Allie Bossier, MD   150 mg at 10/10/15 0959  . carvedilol (COREG) tablet 12.5 mg  12.5 mg Oral BID WC Allie Bossier, MD   12.5 mg at 10/10/15 0850  . cefTRIAXone (ROCEPHIN) 1 g in dextrose 5 % 50 mL IVPB  1 g Intravenous Q24H Cherene Altes, MD   1 g at 10/10/15 0859  . chlorhexidine (PERIDEX) 0.12 % solution 15 mL  15 mL Mouth Rinse BID Cherene Altes, MD   15 mL at 10/09/15 2300  . cholecalciferol (VITAMIN D) tablet 1,000 Units  1,000 Units Oral BID Allie Bossier, MD   1,000 Units at 10/10/15 [phone]  . gabapentin (NEURONTIN) capsule  200 mg  200 mg Oral TID Allie Bossier, MD   200 mg at 10/10/15 1636  . haloperidol lactate (HALDOL) injection 5 mg  5 mg Intravenous Q6H PRN Allie Bossier, MD   5 mg at 10/10/15 1329  . hydrALAZINE (APRESOLINE) injection 5 mg  5 mg Intravenous Q4H PRN Gardiner Barefoot, NP   5 mg at 10/08/15 0538  . hydrALAZINE (APRESOLINE) tablet 50 mg  50 mg Oral TID Allie Bossier, MD   50 mg at 10/10/15 0959  . isosorbide mononitrate (IMDUR) 24 hr tablet 60 mg  60 mg Oral Daily Allie Bossier, MD   60 mg at 10/10/15 0959  . levothyroxine (SYNTHROID, LEVOTHROID) tablet 137 mcg  137 mcg Oral QAC breakfast Allie Bossier, MD   137 mcg at 10/10/15 [phone]  . mometasone-formoterol (DULERA) 100-5 MCG/ACT inhaler 2 puff  2 puff Inhalation BID Allie Bossier, MD   2 puff at 10/10/15 [phone]  . ondansetron (ZOFRAN) injection 4 mg  4 mg Intravenous Q6H PRN Allie Bossier, MD   4 mg at 10/06/15 0655  . oxyCODONE-acetaminophen (PERCOCET) 7.5-325 MG per tablet 1 tablet  1 tablet Oral Q6H PRN Allie Bossier, MD   1 tablet at 10/10/15 0957  . pantoprazole (PROTONIX) EC tablet 40 mg  40 mg Oral BID Cherene Altes, MD   40 mg at 10/10/15 0959  . prednisoLONE acetate (PRED FORTE) 1 %  ophthalmic suspension 1 drop  1 drop Both Eyes Daily PRN Allie Bossier, MD      . predniSONE (DELTASONE) tablet 5 mg  5 mg Oral Q breakfast Cherene Altes, MD   5 mg at 10/10/15 0851  . sodium chloride flush (NS) 0.9 % injection 10-40 mL  10-40 mL Intracatheter PRN Allie Bossier, MD      . timolol (TIMOPTIC) 0.5 % ophthalmic solution 1 drop  1 drop Both Eyes BID Allie Bossier, MD   1 drop at 10/10/15 1004     Discharge Medications: Please see discharge summary for a list of discharge medications.  Relevant Imaging Results:  Relevant Lab Results:   Additional Information SSN: [place] [address] [place], [place]   Updated (10-25-15) Jones Broom. [place], MSW, [place] 10/25/2015 11:23 AM

## 2015-10-10 NOTE — Progress Notes (Addendum)
PROGRESS NOTE    Wendy Cole  [id number] DOB: [birth year] DOA: 10/05/2015 PCP: Walker Kehr, MD   Brief Narrative:  80 y.o. WF PMHx Anxiety, Depression,CVA, CAD native artery, Paroxysmal Atrial Fibrillation on Warfarin Rx, Chronic Diastolic CHF/Cardiomyopathy, NSTEMI, HTN, Stage III CKD, Hypothyroid, OSA and hx of Breast Cancer S/P Rt. Mastectomy HLD, DVT,   Who presents to the ED , presents to the Emergency Department today complaining of BLE swelling and shortness of breath. Noted increasing weakness since DC from hospital on 07-30-15 due to syncopal episode. Seen Neurology on 09-05-15 for follow up with no neurologic cause. Pt notes inability to ambulate due to weakness and called EMS as she was unable to get off of the toilet. Started using wheelchair in the past week due to weakness. Pt also notes right shoulder pain s/p mechanical fall earlier this week with no head trauma. Notes pain is 5/10 and aching. Pt has Chest tightness with SOB. No CP. No N/V/D. No headaches. No fevers. No hematochezia. No black tarry stools. No other symptoms noted.  Occult blood positive In ED. Patient has not noticed blood in urine or Stool.states allergic to Lasix will cause her extreme muscle spasms ~ 30-45 minutes post administration..states baseline weight is ~218 pounds(99 kg). States does not weigh herself daily   Assessment & Plan:   Active Problems:   Essential hypertension   History of DVT (deep vein thrombosis)   OSA on CPAP   Anemia   CKD (chronic kidney disease) stage 3, GFR 30-59 ml/min   Stroke (HCC)   Chronic anticoagulation   Hypothyroidism   Chronic venous embolism and thrombosis of deep vessels of lower extremity (HCC)   Supratherapeutic INR   Lower GI bleed   Diabetes mellitus type 2, uncontrolled, with complications (HCC)   Anxiety   Depression   CAD in native artery   Paroxysmal atrial fibrillation (HCC)   Chronic diastolic CHF (congestive heart failure) (HCC)  Cardiomyopathy (HCC)   HLD (hyperlipidemia)   CHF, acute on chronic (HCC)   Bleeding gastrointestinal   Urinary tract infection, site not specified   Controlled diabetes mellitus type 2 with complications (HCC)   Chronic kidney disease, stage III (moderate)  Staph Agalactiae UTI  -Complete 5 day course Rocephin   Lower GI bleed -On admissions patient's hemoglobin 5.8, positive occult blood -5/13 transfuse 3 units PRBC -GI consulted; secondary to patient's poorly controlled respiratory/cardiac status GI deferred colonoscopy or EGD at this time. Will follow from a distance. -Stable hemoglobin most likely secondary to supratherapeutic INR  Supratherapeutic INR -INR = 6.7 at admission; Vitamin K 10 mg 1 - 5/17 WNL  Recent Labs Lab 10/06/15 1447 10/07/15 0308 10/08/15 0544 10/09/15 0433  HGB 8.2* 7.9* 8.1* 8.3*    Chronic diastolic CHF/Cardiomyopathy -Echocardiogram; confirms dilated cardiomyopathy, diastolic CHF see echocardiogram below -Fluid overloaded (edematous to hips) but significantly improved from admission. -strict in and out since admission -12.8 L -Daily weight Filed Weights   10/08/15 0400 10/09/15 0500 10/10/15 0500  Weight: 99.1 kg (218 lb 7.6 oz) 99.5 kg (219 lb 5.7 oz) 99 kg (218 lb 4.1 oz)  -Coreg 12.5 mg BID -Hydralazine 50 mg TID -Imdur 60 mg daily -Bumex 2 mg IV TID -Out of bed to chair q shift  ADDENDUM; secondary to patient insisting in pain but unable to explain where, what type -EKG obtained when compared to EKG 5/14 patient with frequent PVC, generalized nonspecific ST-T wave change. -Troponin 3 -Repeat EKG at 1500  Paroxysmal Atrial fibrillation -  A. fib, rate controlled  -See cardiomyopathy -Coumadin on hold, will discuss with family if we should restart Coumadin given patient's wish to consume food as she likes.  OSA -5/18 overnight BIPAP on 16/8 and 6L bleed in  DM type 2 Controlled with complications; Diabetic neuropathy -5/13 A1c=  <4.3 -Restart Neurontin 200 mg TID (home dose Neurontin 400 mg BID), however patient just laid on couch per son. Titrate to effect  HLD -Lipid panel within ADA guidelines   CKD stage III (Baseline~1.82-2.12) Lab Results  Component Value Date   CREATININE 2.32* 10/09/2015   CREATININE 2.44* 10/08/2015   CREATININE 2.68* 10/07/2015  -improving with diuresis  Hypothyroidism -Continue Synthroid 137 g daily -TSH; 1.89  DVT -Patient had been on Coumadin unfortunately with GI bleed will hold  Anxiety/Agitation -Clorazepate DC'd  -Haldol IV 5 mg QID PRN  Depression -Continue Wellbutrin 150 mg daily  Hypokalemia -Potassium goal> 4 -Potassium IV 50 mEq  Hypomagnesemia -Magnesium goal> 2 -Magnesium 3 gm  Abdominal pain -After consuming full breakfast which consisted of eggs, sausage  patient now has abdominal pain/nausea -Consult to nutrition place on strict 2000-calorie/day diet -KUB -NPO for now.   Goals of care -Spoke at length with patient and son concerning goals of care patient verified that she should be DO NOT RESUSCITATE. -Patient also knowledge that she ate hamburgers, french fries, diet Cokes at home which is what made her happy. Spoke at length with patient and son concerning what her wishes would be upon discharge to include possible palliative care. Patient will discuss with family    DVT prophylaxis: N/A secondary to GI bleed Code Status: DO NOT RESUSCITATE Family Communication: Spoke with son Disposition Plan: Resolution acute on chronic diastolic CHF/GI bleed   Consultants:  Dr.William Stacie [facility]   Procedures/Significant Events:  PICC 5/13 5/14 echocardiogram;- Left ventricle:-50% to 55%. -(grade 2 diastolic dysfunction). - Aortic valve: moderately restricted. mild to moderate regurgitation - Left atrium: moderately dilated.- Right ventricle: moderately dilated. -Right atrium: moderately dilated. -Tricuspid valve: moderate regurgitation. -  Pulmonary arteries: PA peak pressure: 57 mm Hg (S). 5/16 bilateral lower extremity Doppler; negative DVT  Cultures 5/13 urine positive GROUP B STREP(S.AGALACTIAE 5/13 MRSA by PCR negative  Antimicrobials: Ceftriaxone 5/14>>   Devices    LINES / TUBES:      Continuous Infusions: . sodium chloride 10 mL/hr at 10/06/15 0930     Subjective: 5/18 A/O 4, complains of Postprandial abdominal pain and nausea. Also complains of lower extremity neuropathy (describes pain as pins and needles).   ADDENDUM; page to bedside secondary to patient becoming extremely agitated. Upon arrival Herbie [place] (son) present, patient would only state US Help me Help me. When asked that we could do to help patient with only state she was in pain, unable to explain to [place] exactly where she was having pain. Insisted that we both were shit heads.   Objective: Filed Vitals:   10/09/15 2320 10/10/15 0400 10/10/15 0426 10/10/15 0500  BP:  166/52    Pulse: 62 59 64   Temp:   98.2 F (36.8 C)   TempSrc:   Axillary   Resp: 15 15 11    Height:      Weight:    99 kg (218 lb 4.1 oz)  SpO2: 99% 99% 100%     Intake/Output Summary (Last 24 hours) at 10/10/15 0810 Last data filed at 10/10/15 0600  Gross per 24 hour  Intake    960 ml  Output   3050 ml  Net  -2090 ml   Filed Weights   10/08/15 0400 10/09/15 0500 10/10/15 0500  Weight: 99.1 kg (218 lb 7.6 oz) 99.5 kg (219 lb 5.7 oz) 99 kg (218 lb 4.1 oz)    Examination:  General: A/O 4,C/O abdominal pain, No acute respiratory distress Eyes: negative scleral hemorrhage, negative anisocoria, negative icterus ENT: Negative Runny nose, negative gingival bleeding, Neck:  Negative scars, masses, torticollis, lymphadenopathy, JVD Lungs: Clear to auscultation bilaterally without wheezes or crackles Cardiovascular: Regular rate and rhythm without murmur gallop or rub normal S1 and S2 Abdomen: Morbidly obese, positive abdominal pain with palpation, nondistended,  positive soft, bowel sounds, no rebound, no ascites, no appreciable mass Extremities: No significant cyanosis, clubbing, bilateral pitting edema 2+ mid thigh Skin: Negative rashes, lesions, ulcers Psychiatric:  Poor understanding of disease process Central nervous system:  Cranial nerves II through XII intact, tongue/uvula midline, all extremities muscle strength 5/5, sensation intact throughout,  negative dysarthria, negative expressive aphasia, negative receptive aphasia.  .     Data Reviewed: Care during the described time interval was provided by me .  I have reviewed this patient's available data, including medical history, events of note, physical examination, and all test results as part of my evaluation. I have personally reviewed and interpreted all radiology studies.  CBC:  Recent Labs Lab 10/06/15 0510 10/06/15 1447 10/07/15 0308 10/08/15 0544 10/09/15 0433  WBC 10.6* 10.0 8.4 9.3 10.1  NEUTROABS 9.6*  --   --   --   --   HGB 9.8* 8.2* 7.9* 8.1* 8.3*  HCT 33.4* 28.8* 27.6* 28.3* 29.6*  MCV 75.7* 74.8* 74.8* 74.7* 75.3*  PLT 279 265 252 246 0000000   Basic Metabolic Panel:  Recent Labs Lab 10/05/15 1150 10/05/15 1538 10/06/15 0510 10/07/15 0308 10/08/15 0544 10/09/15 0433 10/09/15 1351  NA 135  --  140 137 138 138  --   K 4.7  --  4.9 4.1 3.4* 3.3* 3.9  CL 109  --  109 107 103 100*  --   CO2 16*  --  16* 20* 22 25  --   GLUCOSE 108*  --  86 145* 138* 94  --   BUN 52*  --  55* 57* 53* 50*  --   CREATININE 2.68*  --  2.66* 2.68* 2.44* 2.32*  --   CALCIUM 8.4*  --  8.7* 8.0* 8.3* 8.1*  --   MG  --  1.7 1.6* 1.7  --   --  1.9   GFR: Estimated Creatinine Clearance: 24.1 mL/min (by C-G formula based on Cr of 2.32). Liver Function Tests:  Recent Labs Lab 10/05/15 1150 10/06/15 0510 10/07/15 0308 10/08/15 0544  AST 18 21 16  14*  ALT 14 17 14  13*  ALKPHOS 79 91 70 68  BILITOT 0.9 2.7* 1.4* 0.9  PROT 6.0* 6.3* 5.4* 5.2*  ALBUMIN 3.0* 3.2* 2.6* 2.5*   No  results for input(s): LIPASE, AMYLASE in the last 168 hours. No results for input(s): AMMONIA in the last 168 hours. Coagulation Profile:  Recent Labs Lab 10/06/15 0510 10/07/15 0308 10/08/15 0639 10/09/15 0433 10/10/15 0500  INR 1.48 1.49 1.41 1.37 1.30   Cardiac Enzymes:  Recent Labs Lab 10/05/15 1538 10/05/15 2120 10/06/15 0510  TROPONINI 0.06* 0.03 0.03   BNP (last 3 results)  Recent Labs  07/22/15 1503  PROBNP >5000.0*   HbA1C: No results for input(s): HGBA1C in the last 72 hours. CBG:  Recent Labs Lab 10/06/15 0801 10/06/15 1146 10/06/15 1558  10/06/15 2041 10/07/15 0034  GLUCAP 90 80 116* 149* 123*   Lipid Profile: No results for input(s): CHOL, HDL, LDLCALC, TRIG, CHOLHDL, LDLDIRECT in the last 72 hours. Thyroid Function Tests: No results for input(s): TSH, T4TOTAL, FREET4, T3FREE, THYROIDAB in the last 72 hours. Anemia Panel: No results for input(s): VITAMINB12, FOLATE, FERRITIN, TIBC, IRON, RETICCTPCT in the last 72 hours. Urine analysis:    Component Value Date/Time   COLORURINE YELLOW 10/05/2015 1233   APPEARANCEUR TURBID* 10/05/2015 1233   LABSPEC 1.019 10/05/2015 1233   LABSPEC 1.010 06/21/2013 1213   PHURINE 5.0 10/05/2015 1233   PHURINE 6.0 06/21/2013 1213   GLUCOSEU NEGATIVE 10/05/2015 1233   GLUCOSEU NEGATIVE 03/20/2014 1454   GLUCOSEU Negative 06/21/2013 1213   HGBUR LARGE* 10/05/2015 1233   HGBUR Negative 06/21/2013 1213   BILIRUBINUR NEGATIVE 10/05/2015 1233   BILIRUBINUR Negative 06/21/2013 1213   KETONESUR NEGATIVE 10/05/2015 1233   KETONESUR Negative 06/21/2013 1213   PROTEINUR 100* 10/05/2015 1233   PROTEINUR 100 06/21/2013 1213   UROBILINOGEN 0.2 03/20/2014 1454   UROBILINOGEN 0.2 06/21/2013 1213   NITRITE NEGATIVE 10/05/2015 1233   NITRITE Negative 06/21/2013 1213   LEUKOCYTESUR LARGE* 10/05/2015 1233   LEUKOCYTESUR Negative 06/21/2013 1213   Sepsis Labs: @LABRCNTIP (procalcitonin:4,lacticidven:4)  ) Recent  Results (from the past 240 hour(s))  Urine culture     Status: Abnormal   Collection Time: 10/05/15 12:33 PM  Result Value Ref Range Status   Specimen Description URINE, CATHETERIZED  Final   Special Requests NONE  Final   Culture (A)  Final    >=100,000 COLONIES/mL GROUP B STREP(S.AGALACTIAE)ISOLATED TESTING AGAINST S. AGALACTIAE NOT ROUTINELY PERFORMED DUE TO PREDICTABILITY OF AMP/PEN/VAN SUSCEPTIBILITY.    Report Status 10/06/2015 FINAL  Final  MRSA PCR Screening     Status: None   Collection Time: 10/05/15  5:38 PM  Result Value Ref Range Status   MRSA by PCR NEGATIVE NEGATIVE Final    Comment:        The GeneXpert MRSA Assay (FDA approved for NASAL specimens only), is one component of a comprehensive MRSA colonization surveillance program. It is not intended to diagnose MRSA infection nor to guide or monitor treatment for MRSA infections.          Radiology Studies: No results found.      Scheduled Meds: . allopurinol  100 mg Oral Daily  . antiseptic oral rinse  7 mL Mouth Rinse q12n4p  . aspirin EC  81 mg Oral Daily  . brimonidine  1 drop Both Eyes BID  . bumetanide  2 mg Intravenous TID  . buPROPion  150 mg Oral Daily  . carvedilol  12.5 mg Oral BID WC  . cefTRIAXone (ROCEPHIN)  IV  1 g Intravenous Q24H  . chlorhexidine  15 mL Mouth Rinse BID  . cholecalciferol  1,000 Units Oral BID  . hydrALAZINE  50 mg Oral TID  . isosorbide mononitrate  60 mg Oral Daily  . levothyroxine  137 mcg Oral QAC breakfast  . mometasone-formoterol  2 puff Inhalation BID  . pantoprazole  40 mg Oral BID  . predniSONE  5 mg Oral Q breakfast  . timolol  1 drop Both Eyes BID   Continuous Infusions: . sodium chloride 10 mL/hr at 10/06/15 0930     LOS: 5 days    Time spent: 40 minutes    WOODS, Geraldo Docker, MD [facility] Pager [phone]   If 7PM-7AM, please contact night-coverage [email or web address] Password [email or web address] 10/10/2015, 8:10 AM

## 2015-10-10 NOTE — Progress Notes (Signed)
Patient yelling out "help" and "it hurts", Son at bedside. MD Sherral Hammers paged, MD in to see patient and speak with son. Transfer cancelled. Will continue to monitor patient. Wendy Cole Wendy Cole

## 2015-10-10 NOTE — Clinical Social Work Note (Signed)
Clinical Social Work Assessment  Patient Details  Name: Wendy Cole MRN: [id number] Date of Birth: 01-06-1936  Date of referral:  10/10/15               Reason for consult:  Facility Placement                Permission sought to share information with:  Family Supports Permission granted to share information::  No (pt not communicating past "help me")  Name::     Wendy Cole  Agency::  [facility] SNFs  Relationship::  husband, son  Contact Information:     Housing/Transportation Living arrangements for the past 2 months:  Single Family Home Source of Information:  Spouse Patient Interpreter Needed:  None Criminal Activity/Legal Involvement Pertinent to Current Situation/Hospitalization:  No - Comment as needed Significant Relationships:  Spouse Lives with:  Spouse Do you feel safe going back to the place where you live?  No Need for family participation in patient care:  Yes (Comment) (decision making)  Care giving concerns:  Pt lives with husband who is disabled (amputee) and youngest son who can not offer much assistance.  Would not have sufficient assistance at home.    Social Worker assessment / plan:  CSW attempted to speak with pt concerning PT recommendation for SNF.  Pt was unable to participate in conversation and would continuous state she needed help- when CSW inquired what she needed help with and pt would not respond.    CSW spoke with husband and oldest son, Wendy Cole, concerning PT recommendation for SNF.  Both feel as if this is the safest option for pt at this time but have concerns about whether or not pt will be agreeable.  Pt son reports that pt has had a fairly quick decline over the past 2 months- pt was getting around somehwhat independently a few months ago and son states she slowly had to use a walker and then at wheelchair the last few weeks.  Employment status:  Retired Forensic scientist:  [employer] PT Recommendations:  [place] / Referral to community resources:  [place]  Patient/Family's Response to care:  Family states pt wants to return home but family is in favor of SNF placement- do not want pt to go home and lay in bed declining.  Family requesting assistance in convincing pt this is the right plan.  Patient/Family's Understanding of and Emotional Response to Diagnosis, Current Treatment, and Prognosis:  Pt family is confused about how off the pt is mentally at this time and wishes there were an explanation for her behavior- son is hopeful since pt declined quickly she can recover quickly  Emotional Assessment Appearance:  Appears stated age Attitude/Demeanor/Rapport:  Uncooperative, Screaming Affect (typically observed):    Orientation:  Fluctuating Orientation (Suspected and/or reported [place]) (would not respond to questions) Alcohol / Substance use:  Not Applicable Psych involvement (Current and /or in the community):  No (Comment)  Discharge Needs  Concerns to be addressed:  Care Coordination Readmission within the last 30 days:  No Current discharge risk:  Physical Impairment Barriers to Discharge:  Continued Medical Work up   [employer], LCSW 10/10/2015, 5:01 PM

## 2015-10-10 NOTE — Progress Notes (Signed)
RT placed patient on BIPAP HS 16/8 and 6L bleed in. Patient tolerating well. RT will continue to monitor as needed.

## 2015-10-10 NOTE — Progress Notes (Signed)
RT placed patient on BIPAP on 16/8 and 6L bleed in. Patient tolerating well. RT will continue to monitor as needed.

## 2015-10-11 ENCOUNTER — Ambulatory Visit: Payer: Medicare Other

## 2015-10-11 DIAGNOSIS — R109 Unspecified abdominal pain: Secondary | ICD-10-CM | POA: Diagnosis present

## 2015-10-11 DIAGNOSIS — F23 Brief psychotic disorder: Secondary | ICD-10-CM | POA: Diagnosis present

## 2015-10-11 DIAGNOSIS — R600 Localized edema: Secondary | ICD-10-CM | POA: Diagnosis present

## 2015-10-11 DIAGNOSIS — R4182 Altered mental status, unspecified: Secondary | ICD-10-CM | POA: Diagnosis present

## 2015-10-11 DIAGNOSIS — F29 Unspecified psychosis not due to a substance or known physiological condition: Secondary | ICD-10-CM

## 2015-10-11 LAB — MAGNESIUM: Magnesium: 2.1 mg/dL (ref 1.7–2.4)

## 2015-10-11 LAB — BASIC METABOLIC PANEL
Anion gap: 13 (ref 5–15)
BUN: 47 mg/dL — AB (ref 6–20)
CALCIUM: 8.7 mg/dL — AB (ref 8.9–10.3)
CHLORIDE: 97 mmol/L — AB (ref 101–111)
CO2: 31 mmol/L (ref 22–32)
CREATININE: 2.43 mg/dL — AB (ref 0.44–1.00)
GFR, EST AFRICAN AMERICAN: 21 mL/min — AB (ref >60)
GFR, EST NON AFRICAN AMERICAN: 18 mL/min — AB (ref >60)
GLUCOSE: 133 mg/dL — AB (ref 65–99)
POTASSIUM: 4.3 mmol/L (ref 3.5–5.1)
SODIUM: 141 mmol/L (ref 135–145)

## 2015-10-11 LAB — PROTIME-INR
INR: 1.22 (ref 0.00–1.49)
Prothrombin Time: 15.5 seconds — ABNORMAL HIGH (ref 11.6–15.2)

## 2015-10-11 LAB — TROPONIN I: Troponin I: 0.05 ng/mL — ABNORMAL HIGH (ref <0.031)

## 2015-10-11 MED ORDER — RISPERIDONE 0.5 MG PO TBDP
0.5000 mg | ORAL_TABLET | Freq: Two times a day (BID) | ORAL | Status: DC
  Administered 2015-10-11: 0.5 mg via ORAL
  Filled 2015-10-11 (×3): qty 1

## 2015-10-11 NOTE — Progress Notes (Signed)
PROGRESS NOTE    Wendy Cole  [id number] DOB: 27-Jun-1935 DOA: 10/05/2015 PCP: Walker Kehr, MD   Brief Narrative:  80 y.o. WF PMHx Anxiety, Depression,CVA, CAD native artery, Paroxysmal Atrial Fibrillation on Warfarin Rx, Chronic Diastolic CHF/Cardiomyopathy, NSTEMI, HTN, Stage III CKD, Hypothyroid, OSA and hx of Breast Cancer S/P Rt. Mastectomy HLD, DVT,   Who presents to the ED , presents to the Emergency Department today complaining of BLE swelling and shortness of breath. Noted increasing weakness since DC from hospital on 07-30-15 due to syncopal episode. Seen Neurology on 09-05-15 for follow up with no neurologic cause. Pt notes inability to ambulate due to weakness and called EMS as she was unable to get off of the toilet. Started using wheelchair in the past week due to weakness. Pt also notes right shoulder pain s/p mechanical fall earlier this week with no head trauma. Notes pain is 5/10 and aching. Pt has Chest tightness with SOB. No CP. No N/V/D. No headaches. No fevers. No hematochezia. No black tarry stools. No other symptoms noted.  Occult blood positive In ED. Patient has not noticed blood in urine or Stool.states allergic to Lasix will cause her extreme muscle spasms ~ 30-45 minutes post administration..states baseline weight is ~218 pounds(99 kg). States does not weigh herself daily   Assessment & Plan:   Active Problems:   Essential hypertension   History of DVT (deep vein thrombosis)   OSA on CPAP   Anemia   CKD (chronic kidney disease) stage 3, GFR 30-59 ml/min   Stroke (HCC)   Chronic anticoagulation   Hypothyroidism   Chronic venous embolism and thrombosis of deep vessels of lower extremity (HCC)   Supratherapeutic INR   Lower GI bleed   Diabetes mellitus type 2, uncontrolled, with complications (HCC)   Anxiety   Depression   CAD in native artery   Paroxysmal atrial fibrillation (HCC)   Chronic diastolic CHF (congestive heart failure) (HCC)  Cardiomyopathy (HCC)   HLD (hyperlipidemia)   CHF, acute on chronic (HCC)   Bleeding gastrointestinal   Urinary tract infection, site not specified   Controlled diabetes mellitus type 2 with complications (HCC)   Chronic kidney disease, stage III (moderate)   Abdominal pain   Altered mental state   Edema of both legs   Psychotic episode  Staph Agalactiae UTI  -Completed 5 day course Rocephin   Lower GI bleed -On admissions patient's hemoglobin 5.8, positive occult blood -5/13 transfuse 3 units PRBC -GI consulted; secondary to patient's poorly controlled respiratory/cardiac status GI deferred colonoscopy or EGD at this time. Will follow from a distance. -Stable hemoglobin most likely secondary to supratherapeutic INR  Supratherapeutic INR -INR = 6.7 at admission; Vitamin K 10 mg 1 - 5/17 WNL  Recent Labs Lab 10/07/15 0308 10/08/15 0544 10/09/15 0433 10/10/15 0905  HGB 7.9* 8.1* 8.3* 8.3*    Chronic diastolic CHF/Cardiomyopathy -Echocardiogram; confirms dilated cardiomyopathy, diastolic CHF see echocardiogram below -Fluid overloaded (edematous to hips) but significantly improved from admission. -strict in and out since admission -18.5 L -Daily weight Filed Weights   10/09/15 0500 10/10/15 0500 10/11/15 0304  Weight: 99.5 kg (219 lb 5.7 oz) 99 kg (218 lb 4.1 oz) 91 kg (200 lb 9.9 oz)  -Coreg 12.5 mg BID -Hydralazine 50 mg TID -Imdur 60 mg daily -Bumex 2 mg IV TID -Out of bed to chair q shift  Paroxysmal Atrial fibrillation -A. fib, rate controlled  -See cardiomyopathy -Coumadin on hold, will discuss with family if we should restart  Coumadin given patient's wish to consume food as she likes.  OSA -5/19 overnight BIPAP on 16/8 and 6L bleed in  DM type 2 Controlled with complications; Diabetic neuropathy -5/13 A1c= <4.3 -Neurontin 200 mg TID (home dose Neurontin 400 mg BID), however patient just laid on couch per son. Titrate to effect  HLD -Lipid panel within  ADA guidelines   CKD stage III (Baseline~1.82-2.12) Lab Results  Component Value Date   CREATININE 1.96* 10/10/2015   CREATININE 2.32* 10/09/2015   CREATININE 2.44* 10/08/2015  -improving with diuresis  Hypothyroidism -Continue Synthroid 137 g daily -TSH; 1.89  DVT -Patient had been on Coumadin unfortunately with GI bleed will hold  Anxiety/Agitation/Psychotic episode -DC Haldol IV 5 mg QID PRN -5/19 start Risperdal 0.5 mg BID -5/19 consulted psychiatry.  Depression -Continue Wellbutrin 150 mg daily  Hypokalemia -Potassium goal> 4   Hypomagnesemia -Magnesium goal> 2  Abdominal pain -After consuming full breakfast which consisted of eggs, sausage  patient now has abdominal pain/nausea -Consult to nutrition place on strict 2000-calorie/day diet -KUB; showed stool burden rectosigmoid colon, start MiraLAX daily -NPO for now.    Goals of care -Spoke at length with patient and son concerning goals of care patient verified that she should be DO NOT RESUSCITATE. -Patient also knowledge that she ate hamburgers, french fries, diet Cokes at home which is what made her happy. Spoke at length with patient and son concerning what her wishes would be upon discharge to include possible palliative care. Patient will discuss with family    DVT prophylaxis: N/A secondary to GI bleed Code Status: DO NOT RESUSCITATE Family Communication: Spoke with son and husband Disposition Plan: Resolution acute on chronic diastolic CHF/GI bleed   Consultants:  Dr.William Stacie Glaze [facility]   Procedures/Significant Events:  PICC 5/13 5/14 echocardiogram;- Left ventricle:-50% to 55%. -(grade 2 diastolic dysfunction). - Aortic valve: moderately restricted. mild to moderate regurgitation - Left atrium: moderately dilated.- Right ventricle: moderately dilated. -Right atrium: moderately dilated. -Tricuspid valve: moderate regurgitation. - Pulmonary arteries: PA peak pressure: 57 mm Hg (S). 5/16  bilateral lower extremity Doppler; negative DVT 5/18 KUB; negative ileus/SBO- stool burden rectosigmoid colon   Cultures 5/13 urine positive GROUP B STREP(S.AGALACTIAE 5/13 MRSA by PCR negative  Antimicrobials: Ceftriaxone 5/14>>5/19   Devices    LINES / TUBES:      Continuous Infusions: . sodium chloride 10 mL/hr at 10/11/15 0300     Subjective: 5/19 alert, cough, sitting in chair with only nod yes and no to questions. Refuses to verbalize answers.,      Objective: Filed Vitals:   10/11/15 0400 10/11/15 0800 10/11/15 0839 10/11/15 1203  BP: 167/71 173/51  124/55  Pulse: 77 77  69  Temp:  100.2 F (37.9 C)  98.5 F (36.9 C)  TempSrc:  Axillary  Oral  Resp: 18 22  19   Height:      Weight:      SpO2: 98% 98% 100% 100%    Intake/Output Summary (Last 24 hours) at 10/11/15 1430 Last data filed at 10/11/15 1217  Gross per 24 hour  Intake    250 ml  Output   5050 ml  Net  -4800 ml   Filed Weights   10/09/15 0500 10/10/15 0500 10/11/15 0304  Weight: 99.5 kg (219 lb 5.7 oz) 99 kg (218 lb 4.1 oz) 91 kg (200 lb 9.9 oz)    Examination:  General: A/O 4,C/O abdominal pain, No acute respiratory distress Eyes: negative scleral hemorrhage, negative anisocoria, negative icterus ENT:  Negative Runny nose, negative gingival bleeding, Neck:  Negative scars, masses, torticollis, lymphadenopathy, JVD Lungs: Clear to auscultation bilaterally without wheezes or crackles Cardiovascular: Regular rate and rhythm without murmur gallop or rub normal S1 and S2 Abdomen: Morbidly obese, positive abdominal pain with palpation, nondistended, positive soft, bowel sounds, no rebound, no ascites, no appreciable mass Extremities: No significant cyanosis, clubbing, bilateral pitting edema 2+ mid thigh Skin: Negative rashes, lesions, ulcers Psychiatric:  Poor understanding of disease process Central nervous system:  Cranial nerves II through XII intact, tongue/uvula midline, all  extremities muscle strength 5/5, sensation intact throughout,  negative dysarthria, negative expressive aphasia, negative receptive aphasia.  .     Data Reviewed: Care during the described time interval was provided by me .  I have reviewed this patient's available data, including medical history, events of note, physical examination, and all test results as part of my evaluation. I have personally reviewed and interpreted all radiology studies.  CBC:  Recent Labs Lab 10/06/15 0510 10/06/15 1447 10/07/15 0308 10/08/15 0544 10/09/15 0433 10/10/15 0905  WBC 10.6* 10.0 8.4 9.3 10.1 8.3  NEUTROABS 9.6*  --   --   --   --  6.6  HGB 9.8* 8.2* 7.9* 8.1* 8.3* 8.3*  HCT 33.4* 28.8* 27.6* 28.3* 29.6* 28.9*  MCV 75.7* 74.8* 74.8* 74.7* 75.3* 75.5*  PLT 279 265 252 246 254 [id number]   Basic Metabolic Panel:  Recent Labs Lab 10/06/15 0510 10/07/15 0308 10/08/15 0544 10/09/15 0433 10/09/15 1351 10/10/15 0905 10/10/15 1620 10/10/15 1835  NA 140 137 138 138  --  132*  --   --   K 4.9 4.1 3.4* 3.3* 3.9 3.3* >7.5* 4.5  CL 109 107 103 100*  --  96*  --   --   CO2 16* 20* 22 25  --  25  --   --   GLUCOSE 86 145* 138* 94  --  86  --   --   BUN 55* 57* 53* 50*  --  43*  --   --   CREATININE 2.66* 2.68* 2.44* 2.32*  --  1.96*  --   --   CALCIUM 8.7* 8.0* 8.3* 8.1*  --  7.6*  --   --   MG 1.6* 1.7  --   --  1.9 1.5* 2.3  --    GFR: Estimated Creatinine Clearance: 27.3 mL/min (by C-G formula based on Cr of 1.96). Liver Function Tests:  Recent Labs Lab 10/05/15 1150 10/06/15 0510 10/07/15 0308 10/08/15 0544 10/10/15 0905  AST 18 21 16  14* 15  ALT 14 17 14  13* 13*  ALKPHOS 79 91 70 68 66  BILITOT 0.9 2.7* 1.4* 0.9 1.2  PROT 6.0* 6.3* 5.4* 5.2* 4.6*  ALBUMIN 3.0* 3.2* 2.6* 2.5* 2.2*   No results for input(s): LIPASE, AMYLASE in the last 168 hours. No results for input(s): AMMONIA in the last 168 hours. Coagulation Profile:  Recent Labs Lab 10/07/15 0308 10/08/15 0639  10/09/15 0433 10/10/15 0500 10/11/15 0254  INR 1.49 1.41 1.37 1.30 1.22   Cardiac Enzymes:  Recent Labs Lab 10/05/15 2120 10/06/15 0510 10/10/15 1620 10/10/15 1627 10/11/15 0254  TROPONINI 0.03 0.03 0.04* <0.03 0.05*   BNP (last 3 results)  Recent Labs  07/22/15 1503  PROBNP >5000.0*   HbA1C: No results for input(s): HGBA1C in the last 72 hours. CBG:  Recent Labs Lab 10/06/15 0801 10/06/15 1146 10/06/15 1558 10/06/15 2041 10/07/15 0034  GLUCAP 90 80 116* 149* 123*   Lipid  Profile: No results for input(s): CHOL, HDL, LDLCALC, TRIG, CHOLHDL, LDLDIRECT in the last 72 hours. Thyroid Function Tests: No results for input(s): TSH, T4TOTAL, FREET4, T3FREE, THYROIDAB in the last 72 hours. Anemia Panel: No results for input(s): VITAMINB12, FOLATE, FERRITIN, TIBC, IRON, RETICCTPCT in the last 72 hours. Urine analysis:    Component Value Date/Time   COLORURINE YELLOW 10/05/2015 1233   APPEARANCEUR TURBID* 10/05/2015 1233   LABSPEC 1.019 10/05/2015 1233   LABSPEC 1.010 06/21/2013 1213   PHURINE 5.0 10/05/2015 1233   PHURINE 6.0 06/21/2013 1213   GLUCOSEU NEGATIVE 10/05/2015 1233   GLUCOSEU NEGATIVE 03/20/2014 1454   GLUCOSEU Negative 06/21/2013 1213   HGBUR LARGE* 10/05/2015 1233   HGBUR Negative 06/21/2013 1213   BILIRUBINUR NEGATIVE 10/05/2015 1233   BILIRUBINUR Negative 06/21/2013 1213   KETONESUR NEGATIVE 10/05/2015 1233   KETONESUR Negative 06/21/2013 1213   PROTEINUR 100* 10/05/2015 1233   PROTEINUR 100 06/21/2013 1213   UROBILINOGEN 0.2 03/20/2014 1454   UROBILINOGEN 0.2 06/21/2013 1213   NITRITE NEGATIVE 10/05/2015 1233   NITRITE Negative 06/21/2013 1213   LEUKOCYTESUR LARGE* 10/05/2015 1233   LEUKOCYTESUR Negative 06/21/2013 1213   Sepsis Labs: @LABRCNTIP (procalcitonin:4,lacticidven:4)  ) Recent Results (from the past 240 hour(s))  Urine culture     Status: Abnormal   Collection Time: 10/05/15 12:33 PM  Result Value Ref Range Status   Specimen  Description URINE, CATHETERIZED  Final   Special Requests NONE  Final   Culture (A)  Final    >=100,000 COLONIES/mL GROUP B STREP(S.AGALACTIAE)ISOLATED TESTING AGAINST S. AGALACTIAE NOT ROUTINELY PERFORMED DUE TO PREDICTABILITY OF AMP/PEN/VAN SUSCEPTIBILITY.    Report Status 10/06/2015 FINAL  Final  MRSA PCR Screening     Status: None   Collection Time: 10/05/15  5:38 PM  Result Value Ref Range Status   MRSA by PCR NEGATIVE NEGATIVE Final    Comment:        The GeneXpert MRSA Assay (FDA approved for NASAL specimens only), is one component of a comprehensive MRSA colonization surveillance program. It is not intended to diagnose MRSA infection nor to guide or monitor treatment for MRSA infections.          Radiology Studies: Dg Abd Portable 1v  10/10/2015  CLINICAL DATA:  Right lower abdominal pain today. Constipation. Initial encounter. EXAM: PORTABLE ABDOMEN - 1 VIEW COMPARISON:  None. FINDINGS: The bowel gas pattern is normal. Mildly prominent stool burden in the rectosigmoid colon is noted. No radio-opaque calculi or other significant radiographic abnormality are seen. IMPRESSION: No acute abnormality.  Mildly prominent rectosigmoid stool burden. Electronically Signed   By: Inge Rise M.D.   On: 10/10/2015 15:10        Scheduled Meds: . allopurinol  100 mg Oral Daily  . antiseptic oral rinse  7 mL Mouth Rinse q12n4p  . aspirin EC  81 mg Oral Daily  . brimonidine  1 drop Both Eyes BID  . bumetanide  2 mg Intravenous TID  . buPROPion  150 mg Oral Daily  . carvedilol  12.5 mg Oral BID WC  . chlorhexidine  15 mL Mouth Rinse BID  . cholecalciferol  1,000 Units Oral BID  . gabapentin  200 mg Oral TID  . hydrALAZINE  50 mg Oral TID  . isosorbide mononitrate  60 mg Oral Daily  . levothyroxine  137 mcg Oral QAC breakfast  . mometasone-formoterol  2 puff Inhalation BID  . pantoprazole  40 mg Oral BID  . polyethylene glycol  17 g Oral Daily  .  predniSONE  5 mg Oral  Q breakfast  . risperiDONE  0.5 mg Oral BID  . timolol  1 drop Both Eyes BID   Continuous Infusions: . sodium chloride 10 mL/hr at 10/11/15 0300     LOS: 6 days    Time spent: 40 minutes    Theoren Palka, Geraldo Docker, MD [facility] Pager [phone]   If 7PM-7AM, please contact night-coverage [email or web address] Password [email or web address] 10/11/2015, 2:30 PM

## 2015-10-11 NOTE — Progress Notes (Signed)
Nutrition Consult/Brief Note  RD consulted to ensure patient is on a 2,000 calorie diet. Patient working with PT at this time. Body mass index is 32.4 kg/(m^2). Patient meets criteria for Obesity Class I based on current BMI.  Labs and medications reviewed >> currently NPO. Resume Heart Healthy/Carbohydrate Modified diet when/as able >> provides average of 1,600 kcals/ day.   Arthur Holms, RD, LDN Pager #: [phone] After-Hours Pager #: [phone]

## 2015-10-11 NOTE — Progress Notes (Signed)
Physical Therapy Treatment Patient Details Name: Wendy Cole MRN: [id number] DOB: July 04, 1935 Today's Date: 10/11/2015    History of Present Illness This 80 y.o. female was brought to ED when she was unable to get up from the toilet.  In ED pt was grossly volume overloaded and had Guiac + stool.  Hgb 5.8 and INR 5.8.  Dx: UTI, diastolic HF exacerbation, GI bleed, OSA, paroxysmal A-Fib, toxic metabolic encephalopathy.  PMH includes:  Anxiety/depression, A-Fib, chronic severe diastolic CHF, HTN, stage III CKD, h/o DVT, OSA, Breast CA, s/p Rt mastectomy.     PT Comments    Pt presents more lethargic today, limiting her amount of participation.  She keeps her eyes closed throughout most of session and continues to repeat several short not relevant phrases.  She required max +2 assist for bed mobility and sit>stand and mod +2 assist to pivot to chair.  SNF remains most appropriate d/c plan for pt.   Follow Up Recommendations  SNF;Supervision/Assistance - 24 hour     Equipment Recommendations  Other (comment) (TBD at next venue of care)    Recommendations for Other Services       Precautions / Restrictions Precautions Precautions: Fall Restrictions Weight Bearing Restrictions: No    Mobility  Bed Mobility Overal bed mobility: Needs Assistance;+2 for physical assistance;+ 2 for safety/equipment Bed Mobility: Supine to Sit     Supine to sit: Max assist;+2 for physical assistance;HOB elevated     General bed mobility comments: Assist to elevate trunk and managing Bil LEs.  Cues for hand placement, pt pulling on bed rail.  Assist using bed pad for positioning as pt unable to scoot to EOB..  Transfers Overall transfer level: Needs assistance Equipment used: 2 person hand held assist Transfers: Sit to/from [employer] Transfers Sit to Stand: +2 physical assistance;Max assist;+2 safety/equipment Stand pivot transfers: Mod assist;+2 physical assistance;+2  safety/equipment       General transfer comment: Attempted stand x3 from bed using Stedy but pt not participating.  Successful attempt standing w/o AD but w/ max assist using bed pad to boost w/ gait belt in place.  Pt not assisting to achieve standing but once standing is able to pivot her feet w/ encouragement.  Pt w/ flexed posture and requires assist to direct hips to chair.  Ambulation/Gait             General Gait Details: did not attempt for pt/therapist safety   Stairs            Wheelchair Mobility    Modified Rankin (Stroke Patients Only)       Balance Overall balance assessment: Needs assistance Sitting-balance support: Bilateral upper extremity supported;Feet supported Sitting balance-Leahy Scale: Poor Sitting balance - Comments: Fluctuates between needing close min guard>mod assist to sit upright EOB due to fatigue/lethargy and posterior lean. Postural control: Posterior lean Standing balance support: Bilateral upper extremity supported;During functional activity Standing balance-Leahy Scale: Poor                      Cognition Arousal/Alertness: Lethargic Behavior During Therapy: Flat affect Overall Cognitive Status: Impaired/Different from baseline Area of Impairment: Orientation;Attention;Memory;Safety/judgement;Problem solving;Following commands;Awareness Orientation Level: Disoriented to;Situation;Time;Person Current Attention Level: Focused Memory: Decreased short-term memory Following Commands: Follows one step commands inconsistently;Follows one step commands with increased time Safety/Judgement: Decreased awareness of safety;Decreased awareness of deficits Awareness: Intellectual Problem Solving: Difficulty sequencing;Requires verbal cues;Slow processing;Decreased initiation;Requires tactile cues General Comments: Pt repeating several short phrases throughout session.  Reports  her last name as Stanton Kidney.  Keeps eyes closed throughout  majority of session.    Exercises      General Comments        Pertinent Vitals/Pain Pain Assessment: Faces Faces Pain Scale: Hurts little more Pain Location: "all over" Pain Descriptors / Indicators: Aching;Grimacing;Moaning Pain Intervention(s): Limited activity within patient's tolerance;Monitored during session;Repositioned    Home Living                      Prior Function            PT Goals (current goals can now be found in the care plan section) Acute Rehab PT Goals Patient Stated Goal: none stated PT Goal Formulation: With patient/family Time For Goal Achievement: 10/23/15 Potential to Achieve Goals: Fair Progress towards PT goals: Not progressing toward goals - comment (due to lethargy)    Frequency  Min 2X/week    PT Plan Frequency needs to be updated    Co-evaluation             End of Session Equipment Utilized During Treatment: Gait belt;Oxygen Activity Tolerance: Patient limited by fatigue;Patient limited by lethargy Patient left: in chair;with call bell/phone within reach;with chair alarm set     Time: [id number] PT Time Calculation (min) (ACUTE ONLY): 27 min  Charges:  $Therapeutic Activity: 23-37 mins                    G Codes:      Collie Siad PT, DPT  Pager: [phone] Phone: [phone] 10/11/2015, 12:40 PM

## 2015-10-11 NOTE — Care Management Important Message (Signed)
Important Message  Patient Details  Name: Wendy Cole MRN: [id number] Date of Birth: 1936/05/17   Medicare Important Message Given:  Yes    [name] T, RN 10/11/2015, 2:15 PM

## 2015-10-11 NOTE — Progress Notes (Signed)
This Probation officer completed a chart review for psych consult. The on-call psychiatrist was given the consult information.   Chesley Noon, MSW, Darlyn Read [facility] Triage Specialist [phone]

## 2015-10-11 NOTE — Progress Notes (Signed)
PASAR #: [id number]   Wendy Cole [phone]

## 2015-10-12 ENCOUNTER — Inpatient Hospital Stay (HOSPITAL_COMMUNITY): Payer: Medicare Other

## 2015-10-12 DIAGNOSIS — J810 Acute pulmonary edema: Secondary | ICD-10-CM

## 2015-10-12 DIAGNOSIS — F329 Major depressive disorder, single episode, unspecified: Secondary | ICD-10-CM

## 2015-10-12 DIAGNOSIS — J811 Chronic pulmonary edema: Secondary | ICD-10-CM | POA: Diagnosis present

## 2015-10-12 DIAGNOSIS — R400 Somnolence: Secondary | ICD-10-CM

## 2015-10-12 LAB — MAGNESIUM: MAGNESIUM: 2 mg/dL (ref 1.7–2.4)

## 2015-10-12 LAB — BASIC METABOLIC PANEL
ANION GAP: 15 (ref 5–15)
BUN: 51 mg/dL — ABNORMAL HIGH (ref 6–20)
CALCIUM: 8.8 mg/dL — AB (ref 8.9–10.3)
CHLORIDE: 96 mmol/L — AB (ref 101–111)
CO2: 33 mmol/L — AB (ref 22–32)
CREATININE: 2.3 mg/dL — AB (ref 0.44–1.00)
GFR calc non Af Amer: 19 mL/min — ABNORMAL LOW (ref >60)
GFR, EST AFRICAN AMERICAN: 22 mL/min — AB (ref >60)
Glucose, Bld: 95 mg/dL (ref 65–99)
Potassium: 4.2 mmol/L (ref 3.5–5.1)
SODIUM: 144 mmol/L (ref 135–145)

## 2015-10-12 LAB — PROTIME-INR
INR: 1.31 (ref 0.00–1.49)
PROTHROMBIN TIME: 16.4 s — AB (ref 11.6–15.2)

## 2015-10-12 LAB — GLUCOSE, CAPILLARY: GLUCOSE-CAPILLARY: 89 mg/dL (ref 65–99)

## 2015-10-12 MED ORDER — RISPERIDONE 0.5 MG PO TBDP
0.2500 mg | ORAL_TABLET | Freq: Two times a day (BID) | ORAL | Status: DC
  Filled 2015-10-12: qty 0.5

## 2015-10-12 MED ORDER — ALTEPLASE 2 MG IJ SOLR
2.0000 mg | Freq: Once | INTRAMUSCULAR | Status: AC
  Administered 2015-10-12: 2 mg
  Filled 2015-10-12: qty 2

## 2015-10-12 MED ORDER — RISPERIDONE 0.5 MG PO TABS
0.2500 mg | ORAL_TABLET | Freq: Two times a day (BID) | ORAL | Status: DC
  Administered 2015-10-14 – 2015-10-18 (×8): 0.25 mg via ORAL
  Filled 2015-10-12 (×9): qty 1

## 2015-10-12 MED ORDER — GABAPENTIN 100 MG PO CAPS: 100.0000 mg | ORAL_CAPSULE | Freq: Three times a day (TID) | ORAL | Status: DC

## 2015-10-12 NOTE — Consult Note (Signed)
[facility] Consult   Reason for Consult:  Capacity evaluation Referring Physician:  Dr. Sherral Hammers Patient Identification: Wendy Cole MRN:  [id number] Principal Diagnosis: <principal problem not specified> Diagnosis:   Patient Active Problem List   Diagnosis Date Noted  . Pulmonary edema [J81.1]   . Abdominal pain [R10.9]   . Altered mental state [R41.82]   . Edema of both legs [R60.0]   . Psychotic episode [F29]   . Urinary tract infection, site not specified [N39.0]   . Controlled diabetes mellitus type 2 with complications ([place]) [T06.2]   . Chronic kidney disease, stage III (moderate) [N18.3]   . Supratherapeutic INR [R79.1] 10/05/2015  . Lower GI bleed [K92.2] 10/05/2015  . Diabetes mellitus type 2, uncontrolled, with complications ([place]) [I94.8, E11.65] 10/05/2015  . Anxiety [F41.9] 10/05/2015  . Depression [F32.9] 10/05/2015  . CAD in native artery [I25.10] 10/05/2015  . Paroxysmal atrial fibrillation ([place]) [I48.0] 10/05/2015  . Chronic diastolic CHF (congestive heart failure) ([place]) [I50.32] 10/05/2015  . Cardiomyopathy ([place]) [I42.9] 10/05/2015  . HLD (hyperlipidemia) [E78.5] 10/05/2015  . CHF, acute on chronic ([place]) [I50.9] 10/05/2015  . Bleeding gastrointestinal [K92.2]   . Systolic CHF, acute on chronic ([place]) [I50.23]   . Syncope and collapse [R55]   . Syncope [R55] 07/23/2015  . Family history of aneurysm [Z84.89] 07/05/2015  . Thoracic back pain [M54.6] 01/21/2015  . Edema [R60.9] 09/21/2014  . Cellulitis of foot, left [L03.116] 08/14/2014  . Acute sinusitis [J01.90] 06/22/2014  . Murmur [R01.1] 02/20/2014  . Hyperglycemia [R73.9] 08/11/2013  . Encounter for therapeutic drug monitoring [Z51.81] 06/21/2013  . Leg pain [M79.606] 05/29/2013  . Swelling of limb-Left foot [M79.89] 05/29/2013  . Occlusion and stenosis of carotid artery without mention of cerebral infarction [I65.29] 05/20/2012  . Chronic venous embolism and thrombosis of deep  vessels of lower extremity ([place]) [I82.509] 05/20/2012  . LBP (low back pain) [M54.5] 04/13/2012  . Flash pulmonary edema ([place]) [J81.0] 02/03/2012  . SIRS (systemic inflammatory response syndrome) ([place]) [R65.10] 02/03/2012  . Acute respiratory failure with hypoxia ([place]) [J96.01] 02/03/2012  . Pyelonephritis [N12] 02/02/2012  . Fever [R50.9] 02/02/2012  . Iritis [H20.9]   . Chronic anticoagulation [Z79.01]   . Obesity [E66.9]   . Cancer ([place]) [C80.1]   . HOH (hard of hearing) [H91.90]   . GERD (gastroesophageal reflux disease) [K21.9]   . Hypothyroidism [E03.9]   . Aftercare following surgery of the circulatory system, [place] [Z48.812] 01/29/2012  . Hypertension associated with diabetes ([place]) [E11.59, I10] 12/15/2011  . Carotid stenosis, bilateral [I65.23] 11/13/2011  . Chronic systolic CHF (congestive heart failure), NYHA class 3- EF 25-30% [I50.22] 09/15/2011  . Nonischemic cardiomyopathy ([place]) [I42.9] 08/07/2011  . SOB (shortness of breath) [R06.02] 07/13/2011  . PAF (paroxysmal atrial fibrillation) ([place]) [I48.0] 07/13/2011  . Leukocytosis [D72.829] 07/13/2011  . OSA on CPAP [G47.33]   . Anemia [D64.9]   . CKD (chronic kidney disease) stage 3, GFR 30-59 ml/min [N18.3]   . Gout [M10.9]   . Stroke [facility]) [I63.9]   . Headache [R51] 11/27/2009  . History of DVT (deep vein thrombosis) [Z86.718] 01/31/2009  . LIVER FUNCTION TESTS, ABNORMAL, HX OF [Z86.39, Z86.2] 12/11/2008  . Essential hypertension [I10] 01/17/2008  . APHTHOUS STOMATITIS [K12.0] 01/17/2008  . Lower extremity edema [R60.0] 10/11/2007  . Dyspnea on exertion [R06.09] 08/25/2007  . Chronic kidney disease (CKD), stage IV (severe) ([place]) [N18.4] 07/05/2007  . BUNION [M21.619] 04/28/2007  . Diabetes type 2, controlled ([place]) [E11.9] 03/22/2007  . Dyslipidemia [E78.5] 03/22/2007  .  Unspecified Anemia [D64.9] 03/22/2007  . OSTEOARTHRITIS [M19.90] 03/22/2007  . Polymyalgia rheumatica- off steroids since 2012 [M35.3] 03/22/2007     Total Time spent with patient: 1 hour  Subjective:   Wendy Cole is a 80 y.o. female patient admitted with SOB and leg swelling.  HPI:  Wendy Cole is a 80 years old female, seen, chart reviewed for psychiatric consultation and evaluation of capacity to make her own medical decisions. Patient is suffering with multiple medical problems including COPD, obstructive sleep apnea and currently on breathing remission. Patient could not communicate during this evaluation. Patient husband is at bedside reported patient presented with shortness of breath and leg swelling secondary to recent medication change in her steroids prednisone. Patient husband requested with an indication for out of her medication at the time of discharge. Patient reported he cannot provide any physical services to her because of he has only 1 leg and his wife knows all her medications and takes has recommended. Reportedly she has no problems with noncompliance as far patient has been.   Medical history: Patient with PMHx Anxiety, Depression,CVA, CAD native artery, Paroxysmal Atrial Fibrillation on Warfarin Rx, Chronic Diastolic CHF/Cardiomyopathy, NSTEMI, HTN, Stage III CKD, Hypothyroid, OSA and hx of Breast Cancer S/P Rt. Mastectomy HLD, DVT,   Who presents to the ED , presents to the Emergency Department today complaining of BLE swelling and shortness of breath. Noted increasing weakness since DC from hospital on 07-30-15 due to syncopal episode. Seen Neurology on 09-05-15 for follow up with no neurologic cause. Pt notes inability to ambulate due to weakness and called EMS as she was unable to get off of the toilet. Started using wheelchair in the past week due to weakness. Pt also notes right shoulder pain s/p mechanical fall earlier this week with no head trauma. Notes pain is 5/10 and aching. Pt has Chest tightness with SOB. No CP. No N/V/D. No headaches. No fevers. No hematochezia. No black tarry stools. No other  symptoms noted. Occult blood positive In ED. Patient has not noticed blood in urine or Stool.states allergic to Lasix will cause her extreme muscle spasms ~ 30-45 minutes post administration..states baseline weight is ~218 pounds(99 kg). States does not weigh herself daily  Past Psychiatric History: Patient has a history of depression but no acute psychiatric hospitalization reported.  Risk to Self: Is patient at risk for suicide?: No Risk to Others:   Prior Inpatient Therapy:   Prior Outpatient Therapy:    Past Medical History:  Past Medical History  Diagnosis Date  . Anemia   . Anxiety states   . Depressive disorder, not elsewhere classified   . Type II or unspecified type diabetes mellitus without mention of complication, not stated as uncontrolled   . Hyperlipidemia   . Unspecified essential hypertension   . Renal insufficiency   . Osteoarthritis   . Gout   . Iritis   . Nonischemic cardiomyopathy (HCC)     EF 20 to 25% per echo 06/2011  . Chronic anticoagulation   . NSTEMI (non-ST elevated myocardial infarction) [facility]) Feb 2013    06/2011 cath  Mild nonobstructive disease. No LV gram  . PAF (paroxysmal atrial fibrillation) ([place])   . Obesity   . Cancer [facility]) 2010    Right breast  . DVT (deep venous thrombosis) ([place]) 2010  . Stroke [facility]) 2004    Left upper lobe  . Thyroid disease 1969    Three fourth of Thyroid removed  . Complication of anesthesia     "  fights it", for colonoscopy  . HOH (hard of hearing)     deaf in L completely  . OSA on CPAP     CPAP at night  . GERD (gastroesophageal reflux disease)   . Hypothyroidism   . Peripheral vascular disease [facility]) Jan. 2015    Past Surgical History  Procedure Laterality Date  . Breast lumpectomy    . Cataract extraction    . Foot surgery    . Tonsillectomy  1949  . Abdominal hysterectomy  1974  . Eye surgery  2008    Glaucoma shunt Right eye  . Parotid gland tumor excision  2000  . Cystectomy  1974    Left  Breast, chin, left of groin area  . Thyroidectomy, partial  1969  . Endarterectomy  11/18/2011    rocedure: ENDARTERECTOMY CAROTID;  Surgeon: Conrad Gilcrest, MD;  Location: [place];  Service: Vascular;  Laterality: Left;  [name] Mastectomy  right  . Mastectomy    . Cardiac catheterization      no stent  . Colonoscopy w/ polypectomy    . Endarterectomy  01/12/2012    Procedure: ENDARTERECTOMY CAROTID;  Surgeon: Conrad Saginaw, MD;  Location: [facility] OR;  Service: Vascular;  Laterality: Right;  Right carotid endarterectomy with 1cm x 6cm vascuguard patch angioplasty.  . Carotid endarterectomy Left 11-18-11    cea  . Carotid endarterectomy Right 01-12-12    cea  . Left and right heart catheterization with coronary/graft angiogram Left 07/20/2011    Procedure: LEFT AND RIGHT HEART CATHETERIZATION WITH Beatrix Fetters;  Surgeon: Peter M Martinique, MD;  Location: [facility] CATH LAB;  Service: Cardiovascular;  Laterality: Left;   Family History:  Family History  Problem Relation Age of Onset  . Hypertension    . Congestive Heart Failure Mother 52  . Breast cancer Mother   . Heart attack Mother   . Heart disease Father 38  . Heart disease Brother   . Coronary artery disease Son 40    2 Stents  . Heart attack Father   . AAA (abdominal aortic aneurysm) Brother    Family Psychiatric  History: Unknown Social History:  History  Alcohol Use No     History  Drug Use No    Social History   Social History  . Marital Status: Married    Spouse Name: N/A  . Number of Children: 3  . Years of Education: N/A   Occupational History  . Retired    Social History Main Topics  . Smoking status: Former Smoker -- 2.00 packs/day for 40 years    Types: Cigarettes    Quit date: 05/25/2002  . Smokeless tobacco: Never Used  . Alcohol Use: No  . Drug Use: No  . Sexual Activity: Not Currently   Other Topics Concern  . None   Social History Narrative   Lives with husband and youngest son.     Additional Social  History:    Allergies:   Allergies  Allergen Reactions  . Amlodipine     edema  . Atorvastatin Other (See Comments)    Muscle aches  . Colesevelam Other (See Comments)    unknown  . Coumadin [Warfarin Sodium]     "causes cramps" reaction to brand name only per pt  . Lasix [Furosemide] Other (See Comments)    Muscle cramps  . Statins Other (See Comments)    Muscle aches  . Tape Rash    Labs:  Results for orders placed or performed during  the hospital encounter of 10/05/15 (from the past 48 hour(s))  Troponin I (q 6hr x 3)     Status: Abnormal   Collection Time: 10/10/15  4:20 PM  Result Value Ref Range   Troponin I 0.04 (H) <0.031 ng/mL    Comment:        PERSISTENTLY INCREASED TROPONIN VALUES IN THE RANGE OF 0.04-0.49 ng/mL CAN BE SEEN IN:       -UNSTABLE ANGINA       -CONGESTIVE HEART FAILURE       -MYOCARDITIS       -CHEST TRAUMA       -ARRYHTHMIAS       -LATE PRESENTING MYOCARDIAL INFARCTION       -COPD   CLINICAL FOLLOW-UP RECOMMENDED. REPEATED TO VERIFY   Potassium     Status: Abnormal   Collection Time: 10/10/15  4:20 PM  Result Value Ref Range   Potassium >7.5 (HH) 3.5 - 5.1 mmol/L    Comment: NO VISIBLE HEMOLYSIS REPEATED TO VERIFY CRITICAL RESULT CALLED TO, READ BACK BY AND VERIFIED WITH: T HOOD,RN 1720 10/10/2015 WBOND   Magnesium     Status: None   Collection Time: 10/10/15  4:20 PM  Result Value Ref Range   Magnesium 2.3 1.7 - 2.4 mg/dL  Troponin I (q 6hr x 3)     Status: None   Collection Time: 10/10/15  4:27 PM  Result Value Ref Range   Troponin I <0.03 <0.031 ng/mL    Comment:        NO INDICATION OF MYOCARDIAL INJURY. REPEATED TO VERIFY   Potassium     Status: None   Collection Time: 10/10/15  6:35 PM  Result Value Ref Range   Potassium 4.5 3.5 - 5.1 mmol/L  Troponin I (q 6hr x 3)     Status: Abnormal   Collection Time: 10/11/15  2:54 AM  Result Value Ref Range   Troponin I 0.05 (H) <0.031 ng/mL    Comment:        PERSISTENTLY  INCREASED TROPONIN VALUES IN THE RANGE OF 0.04-0.49 ng/mL CAN BE SEEN IN:       -UNSTABLE ANGINA       -CONGESTIVE HEART FAILURE       -MYOCARDITIS       -CHEST TRAUMA       -ARRYHTHMIAS       -LATE PRESENTING MYOCARDIAL INFARCTION       -COPD   CLINICAL FOLLOW-UP RECOMMENDED.   Protime-INR     Status: Abnormal   Collection Time: 10/11/15  2:54 AM  Result Value Ref Range   Prothrombin Time 15.5 (H) 11.6 - 15.2 seconds   INR 1.22 0.00 - 7.32  Basic metabolic panel     Status: Abnormal   Collection Time: 10/11/15  3:10 PM  Result Value Ref Range   Sodium 141 135 - 145 mmol/L    Comment: DELTA CHECK NOTED   Potassium 4.3 3.5 - 5.1 mmol/L   Chloride 97 (L) 101 - 111 mmol/L   CO2 31 22 - 32 mmol/L   Glucose, Bld 133 (H) 65 - 99 mg/dL   BUN 47 (H) 6 - 20 mg/dL   Creatinine, Ser 2.43 (H) 0.44 - 1.00 mg/dL   Calcium 8.7 (L) 8.9 - 10.3 mg/dL   GFR calc non Af Amer 18 (L) >60 mL/min   GFR calc Af Amer 21 (L) >60 mL/min    Comment: (NOTE) The eGFR has been calculated using the CKD EPI equation. This calculation  has not been validated in all clinical situations. eGFR's persistently <60 mL/min signify possible Chronic Kidney Disease.    Anion gap 13 5 - 15  Magnesium     Status: None   Collection Time: 10/11/15  3:10 PM  Result Value Ref Range   Magnesium 2.1 1.7 - 2.4 mg/dL  Protime-INR     Status: Abnormal   Collection Time: 10/12/15  4:37 AM  Result Value Ref Range   Prothrombin Time 16.4 (H) 11.6 - 15.2 seconds   INR 1.31 0.00 - 7.82  Basic metabolic panel     Status: Abnormal   Collection Time: 10/12/15  4:37 AM  Result Value Ref Range   Sodium 144 135 - 145 mmol/L   Potassium 4.2 3.5 - 5.1 mmol/L   Chloride 96 (L) 101 - 111 mmol/L   CO2 33 (H) 22 - 32 mmol/L   Glucose, Bld 95 65 - 99 mg/dL   BUN 51 (H) 6 - 20 mg/dL   Creatinine, Ser 2.30 (H) 0.44 - 1.00 mg/dL   Calcium 8.8 (L) 8.9 - 10.3 mg/dL   GFR calc non Af Amer 19 (L) >60 mL/min   GFR calc Af Amer 22 (L) >60  mL/min    Comment: (NOTE) The eGFR has been calculated using the CKD EPI equation. This calculation has not been validated in all clinical situations. eGFR's persistently <60 mL/min signify possible Chronic Kidney Disease.    Anion gap 15 5 - 15  Magnesium     Status: None   Collection Time: 10/12/15  4:37 AM  Result Value Ref Range   Magnesium 2.0 1.7 - 2.4 mg/dL  Glucose, capillary     Status: None   Collection Time: 10/12/15  7:27 AM  Result Value Ref Range   Glucose-Capillary 89 65 - 99 mg/dL    Current Facility-Administered Medications  Medication Dose Route Frequency Provider Last Rate Last Dose  . 0.45 % sodium chloride infusion   Intravenous Continuous Cherene Altes, MD 10 mL/hr at 10/11/15 0300    . acetaminophen (TYLENOL) suppository 650 mg  650 mg Rectal Q4H PRN Cherene Altes, MD   650 mg at 10/06/15 0955  . acetaminophen (TYLENOL) tablet 650 mg  650 mg Oral Q4H PRN Cherene Altes, MD   650 mg at 10/06/15 1650  . albuterol (PROVENTIL) (2.5 MG/3ML) 0.083% nebulizer solution 2.5 mg  2.5 mg Nebulization Q2H PRN Cherene Altes, MD   2.5 mg at 10/06/15 1733  . allopurinol (ZYLOPRIM) tablet 100 mg  100 mg Oral Daily Allie Bossier, MD   100 mg at 10/11/15 0939  . antiseptic oral rinse (CPC / CETYLPYRIDINIUM CHLORIDE 0.05%) solution 7 mL  7 mL Mouth Rinse q12n4p Cherene Altes, MD   7 mL at 10/11/15 1600  . aspirin EC tablet 81 mg  81 mg Oral Daily Allie Bossier, MD   81 mg at 10/11/15 0936  . brimonidine (ALPHAGAN) 0.2 % ophthalmic solution 1 drop  1 drop Both Eyes BID Cherene Altes, MD   1 drop at 10/11/15 2134  . bumetanide (BUMEX) injection 2 mg  2 mg Intravenous TID Cherene Altes, MD   2 mg at 10/11/15 2121  . buPROPion (WELLBUTRIN SR) 12 hr tablet 150 mg  150 mg Oral Daily Allie Bossier, MD   150 mg at 10/11/15 0942  . carvedilol (COREG) tablet 12.5 mg  12.5 mg Oral BID WC Allie Bossier, MD   12.5 mg at 10/11/15  1709  . chlorhexidine (PERIDEX) 0.12  % solution 15 mL  15 mL Mouth Rinse BID Cherene Altes, MD   15 mL at 10/11/15 2130  . cholecalciferol (VITAMIN D) tablet 1,000 Units  1,000 Units Oral BID Allie Bossier, MD   1,000 Units at 10/11/15 [phone]  . gabapentin (NEURONTIN) capsule 100 mg  100 mg Oral TID Allie Bossier, MD      . hydrALAZINE (APRESOLINE) injection 5 mg  5 mg Intravenous Q4H PRN Gardiner Barefoot, NP   5 mg at 10/12/15 6333  . hydrALAZINE (APRESOLINE) tablet 50 mg  50 mg Oral TID Allie Bossier, MD   50 mg at 10/11/15 1708  . isosorbide mononitrate (IMDUR) 24 hr tablet 60 mg  60 mg Oral Daily Allie Bossier, MD   60 mg at 10/11/15 0936  . levothyroxine (SYNTHROID, LEVOTHROID) tablet 137 mcg  137 mcg Oral QAC breakfast Allie Bossier, MD   137 mcg at 10/11/15 0935  . mometasone-formoterol (DULERA) 100-5 MCG/ACT inhaler 2 puff  2 puff Inhalation BID Allie Bossier, MD   2 puff at 10/11/15 2139  . ondansetron (ZOFRAN) injection 4 mg  4 mg Intravenous Q6H PRN Allie Bossier, MD   4 mg at 10/06/15 0655  . oxyCODONE-acetaminophen (PERCOCET) 7.5-325 MG per tablet 1 tablet  1 tablet Oral Q6H PRN Allie Bossier, MD   1 tablet at 10/11/15 1050  . pantoprazole (PROTONIX) EC tablet 40 mg  40 mg Oral BID Cherene Altes, MD   40 mg at 10/11/15 0939  . polyethylene glycol (MIRALAX / GLYCOLAX) packet 17 g  17 g Oral Daily Dianne Dun, NP   17 g at 10/11/15 0941  . prednisoLONE acetate (PRED FORTE) 1 % ophthalmic suspension 1 drop  1 drop Both Eyes Daily PRN Allie Bossier, MD   1 drop at 10/11/15 0940  . predniSONE (DELTASONE) tablet 5 mg  5 mg Oral Q breakfast Cherene Altes, MD   5 mg at 10/11/15 5456  . risperiDONE (RISPERDAL M-TABS) disintegrating tablet 0.25 mg  0.25 mg Oral BID Allie Bossier, MD      . sodium chloride flush (NS) 0.9 % injection 10-40 mL  10-40 mL Intracatheter PRN Allie Bossier, MD      . timolol (TIMOPTIC) 0.5 % ophthalmic solution 1 drop  1 drop Both Eyes BID Allie Bossier, MD   1 drop at  10/11/15 2133    Musculoskeletal: Strength & Muscle Tone: decreased Gait & Station: unable to stand Patient leans: N/A  Psychiatric Specialty Exam: Review of Systems  Unable to perform ROS    Blood pressure 167/43, pulse 77, temperature 98.5 F (36.9 C), temperature source Oral, resp. rate 24, height _0  (1.676 m), weight 88.3 kg (194 lb 10.7 oz), SpO2 100 %.Body mass index is 31.43 kg/(m^2).  General Appearance: Casual  Eye Contact::  Poor  Speech:  NA  Volume:  Decreased  Mood:  NA  Affect:  NA  Thought Process:  NA  Orientation:  NA  Thought Content:  NA  Suicidal Thoughts:  TBD  Homicidal Thoughts:  TBD  Memory:  NA  Judgement:  NA  Insight:  NA  Psychomotor Activity:  NA  Concentration:  NA  Recall:  NA  Fund of Knowledge:NA  Language: NA  Akathisia:  NA  Handed:  Right  AIMS (if indicated):     Assets:  Others:  Deferred  ADL's:  Impaired  Cognition: Impaired,  Severe  Sleep:      Treatment Plan Summary: Patient was not able to participate in psychiatric face-to-face treatment today due to sedation and also CPAP. Psychiatric consultation will call follow-up with her when patient is able to communicate for the evaluation  Patient will continue her current psychiatric medication Wellbutrin XL for depression, Neurontin for anxiety and Risperdal for steroid-induced psychosis which can be adjusted as clinically required  Appreciate psychiatric consultation and follow up as clinically required Please contact [phone] or [phone] if needs further assistance  Disposition: Supportive therapy provided about ongoing stressors.  Durward Parcel., MD 10/12/2015 11:09 AM

## 2015-10-12 NOTE — Progress Notes (Signed)
Patient placed on BIPAP QHS 16/3 with O2 bleed in./ Tolerating well, no issues at this time.

## 2015-10-12 NOTE — Progress Notes (Signed)
PROGRESS NOTE    Wendy Cole  [id number] DOB: [birth year] DOA: 10/05/2015 PCP: Walker Kehr, MD   Brief Narrative:  80 y.o. WF PMHx Anxiety, Depression,CVA, CAD native artery, Paroxysmal Atrial Fibrillation on Warfarin Rx, Chronic Diastolic CHF/Cardiomyopathy, NSTEMI, HTN, Stage III CKD, Hypothyroid, OSA and hx of Breast Cancer S/P Rt. Mastectomy HLD, DVT,   Who presents to the ED , presents to the Emergency Department today complaining of BLE swelling and shortness of breath. Noted increasing weakness since DC from hospital on 07-30-15 due to syncopal episode. Seen Neurology on 09-05-15 for follow up with no neurologic cause. Pt notes inability to ambulate due to weakness and called EMS as she was unable to get off of the toilet. Started using wheelchair in the past week due to weakness. Pt also notes right shoulder pain s/p mechanical fall earlier this week with no head trauma. Notes pain is 5/10 and aching. Pt has Chest tightness with SOB. No CP. No N/V/D. No headaches. No fevers. No hematochezia. No black tarry stools. No other symptoms noted.  Occult blood positive In ED. Patient has not noticed blood in urine or Stool.states allergic to Lasix will cause her extreme muscle spasms ~ 30-45 minutes post administration..states baseline weight is ~218 pounds(99 kg). States does not weigh herself daily   Assessment & Plan:   Active Problems:   Essential hypertension   History of DVT (deep vein thrombosis)   OSA on CPAP   Anemia   CKD (chronic kidney disease) stage 3, GFR 30-59 ml/min   Stroke (HCC)   Chronic anticoagulation   Hypothyroidism   Chronic venous embolism and thrombosis of deep vessels of lower extremity (HCC)   Supratherapeutic INR   Lower GI bleed   Diabetes mellitus type 2, uncontrolled, with complications (HCC)   Anxiety   Depression   CAD in native artery   Paroxysmal atrial fibrillation (HCC)   Chronic diastolic CHF (congestive heart failure) (HCC)  Cardiomyopathy (HCC)   HLD (hyperlipidemia)   CHF, acute on chronic (HCC)   Bleeding gastrointestinal   Urinary tract infection, site not specified   Controlled diabetes mellitus type 2 with complications (HCC)   Chronic kidney disease, stage III (moderate)   Abdominal pain   Altered mental state   Edema of both legs   Psychotic episode  Staph Agalactiae UTI  -Completed 5 day course Rocephin   Lower GI bleed -On admissions patient's hemoglobin 5.8, positive occult blood -5/13 transfuse 3 units PRBC -GI consulted; secondary to patient's poorly controlled respiratory/cardiac status GI deferred colonoscopy or EGD at this time. Will follow from a distance. -Stable hemoglobin most likely secondary to supratherapeutic INR  Supratherapeutic INR -INR = 6.7 at admission; Vitamin K 10 mg 1 - 5/17 WNL  Recent Labs Lab 10/07/15 0308 10/08/15 0544 10/09/15 0433 10/10/15 0905  HGB 7.9* 8.1* 8.3* 8.3*    Chronic diastolic CHF/Cardiomyopathy -Echocardiogram; confirms dilated cardiomyopathy, diastolic CHF see echocardiogram below -Fluid overloaded (edematous to hips) but significantly improved from admission. -strict in and out since admission -21 L -Daily weight Filed Weights   10/10/15 0500 10/11/15 0304 10/12/15 0221  Weight: 99 kg (218 lb 4.1 oz) 91 kg (200 lb 9.9 oz) 88.3 kg (194 lb 10.7 oz)  -Coreg 12.5 mg BID -Hydralazine 50 mg TID -Imdur 60 mg daily -Bumex 2 mg IV TID -Out of bed to chair q shift  Paroxysmal Atrial fibrillation -A. fib, rate controlled  -See cardiomyopathy -Coumadin on hold, will discuss with family if we should restart  Coumadin given patient's wish to consume food as she likes.  OSA -5/20 overnight BIPAP on 16/3 With O2 bleed in  DM type 2 Controlled with complications; Diabetic neuropathy -5/13 A1c= <4.3 -Neurontin 200 mg TID (home dose Neurontin 400 mg BID), however patient just laid on couch per son. Titrate to effect -5/20 patient sleepy some  difficulty arousing most likely secondary to medication. Decrease Neurontin to 100 mg TID  HLD -Lipid panel within ADA guidelines   CKD stage III (Baseline~1.82-2.12) Lab Results  Component Value Date   CREATININE 2.30* 10/12/2015   CREATININE 2.43* 10/11/2015   CREATININE 1.96* 10/10/2015  -improving with diuresis  Hypothyroidism -Continue Synthroid 137 g daily -TSH; 1.89  DVT -Patient had been on Coumadin unfortunately with GI bleed will hold  Anxiety/Agitation/Psychotic episode -DC Haldol IV 5 mg QID PRN -5/19 consulted psychiatry. -5/ 20 decrease Risperdal 0.25 mg BID  Depression -Continue Wellbutrin 150 mg daily  Hypokalemia -Potassium goal> 4   Hypomagnesemia -Magnesium goal> 2  Abdominal pain -After consuming full breakfast which consisted of eggs, sausage  patient now has abdominal pain/nausea -Consult to nutrition place on strict 2000-calorie/day diet -KUB; showed stool burden rectosigmoid colon, start MiraLAX daily -NPO for now.  AMS -Patient much more lethargic today may be secondary to patient's pain medication, + antipsychotics. Have decreased these medications if patient mental status does not improve will obtain head CT    Goals of care -Spoke at length with patient and son concerning goals of care patient verified that she should be DO NOT RESUSCITATE. -Patient also knowledge that she ate hamburgers, french fries, diet Cokes at home which is what made her happy. Spoke at length with patient and son concerning what her wishes would be upon discharge to include possible palliative care. Patient will discuss with family    DVT prophylaxis: N/A secondary to GI bleed Code Status: DO NOT RESUSCITATE Family Communication: Spoke with son and husband Disposition Plan: Resolution acute on chronic diastolic CHF/GI bleed   Consultants:  Dr.William Stacie [facility]   Procedures/Significant Events:  PICC 5/13 5/14 echocardiogram;- Left ventricle:-50% to  55%. -(grade 2 diastolic dysfunction). - Aortic valve: moderately restricted. mild to moderate regurgitation - Left atrium: moderately dilated.- Right ventricle: moderately dilated. -Right atrium: moderately dilated. -Tricuspid valve: moderate regurgitation. - Pulmonary arteries: PA peak pressure: 57 mm Hg (S). 5/16 bilateral lower extremity Doppler; negative DVT 5/18 KUB; negative ileus/SBO- stool burden rectosigmoid colon   Cultures 5/13 urine positive GROUP B STREP(S.AGALACTIAE 5/13 MRSA by PCR negative  Antimicrobials: Ceftriaxone 5/14>>5/19   Devices    LINES / TUBES:      Continuous Infusions: . sodium chloride 10 mL/hr at 10/11/15 0300     Subjective: 5/20, patient very lethargic sleeping the BiPAP. Will slightly arouse with moderate stimulation. Does not follow commands.      Objective: Filed Vitals:   10/11/15 2343 10/12/15 0221 10/12/15 0450 10/12/15 0728  BP: 127/49  179/54 145/57  Pulse: 72  76 76  Temp: 98.4 F (36.9 C)  97.5 F (36.4 C) 98.5 F (36.9 C)  TempSrc: Oral  Oral Oral  Resp: 21  19 17   Height:      Weight:  88.3 kg (194 lb 10.7 oz)    SpO2: 92%  98% 100%    Intake/Output Summary (Last 24 hours) at 10/12/15 0817 Last data filed at 10/12/15 0456  Gross per 24 hour  Intake      0 ml  Output   2540 ml  Net  -  2540 ml   Filed Weights   10/10/15 0500 10/11/15 0304 10/12/15 0221  Weight: 99 kg (218 lb 4.1 oz) 91 kg (200 lb 9.9 oz) 88.3 kg (194 lb 10.7 oz)    Examination:  General: Patient sleeping in bed will arouse to moderate stimuli, answers yes/no to simple questions but does not follow commands with draws to pain when pressure applies to legs, No acute respiratory distress Eyes: negative scleral hemorrhage, negative anisocoria, negative icterus ENT: Negative Runny nose, negative gingival bleeding, Neck:  Negative scars, masses, torticollis, lymphadenopathy, JVD Lungs: Clear to auscultation bilaterally without wheezes or  crackles Cardiovascular: Regular rate and rhythm without murmur gallop or rub normal S1 and S2 Abdomen: Morbidly obese, positive abdominal pain with palpation, nondistended, positive soft, bowel sounds, no rebound, no ascites, no appreciable mass Extremities: No significant cyanosis, clubbing, bilateral pitting edema 1+ mid thigh Skin: Negative rashes, lesions, ulcers Psychiatric:  Poor understanding of disease process; awaiting psychiatry evaluation Central nervous system:  Unable to fully assess secondary to patient's lethargy.       Data Reviewed: Care during the described time interval was provided by me .  I have reviewed this patient's available data, including medical history, events of note, physical examination, and all test results as part of my evaluation. I have personally reviewed and interpreted all radiology studies.  CBC:  Recent Labs Lab 10/06/15 0510 10/06/15 1447 10/07/15 0308 10/08/15 0544 10/09/15 0433 10/10/15 0905  WBC 10.6* 10.0 8.4 9.3 10.1 8.3  NEUTROABS 9.6*  --   --   --   --  6.6  HGB 9.8* 8.2* 7.9* 8.1* 8.3* 8.3*  HCT 33.4* 28.8* 27.6* 28.3* 29.6* 28.9*  MCV 75.7* 74.8* 74.8* 74.7* 75.3* 75.5*  PLT 279 265 252 246 254 [id number]   Basic Metabolic Panel:  Recent Labs Lab 10/08/15 0544 10/09/15 0433 10/09/15 1351 10/10/15 0905 10/10/15 1620 10/10/15 1835 10/11/15 1510 10/12/15 0437  NA 138 138  --  132*  --   --  141 144  K 3.4* 3.3* 3.9 3.3* >7.5* 4.5 4.3 4.2  CL 103 100*  --  96*  --   --  97* 96*  CO2 22 25  --  25  --   --  31 33*  GLUCOSE 138* 94  --  86  --   --  133* 95  BUN 53* 50*  --  43*  --   --  47* 51*  CREATININE 2.44* 2.32*  --  1.96*  --   --  2.43* 2.30*  CALCIUM 8.3* 8.1*  --  7.6*  --   --  8.7* 8.8*  MG  --   --  1.9 1.5* 2.3  --  2.1 2.0   GFR: Estimated Creatinine Clearance: 22.9 mL/min (by C-G formula based on Cr of 2.3). Liver Function Tests:  Recent Labs Lab 10/05/15 1150 10/06/15 0510 10/07/15 0308  10/08/15 0544 10/10/15 0905  AST 18 21 16  14* 15  ALT 14 17 14  13* 13*  ALKPHOS 79 91 70 68 66  BILITOT 0.9 2.7* 1.4* 0.9 1.2  PROT 6.0* 6.3* 5.4* 5.2* 4.6*  ALBUMIN 3.0* 3.2* 2.6* 2.5* 2.2*   No results for input(s): LIPASE, AMYLASE in the last 168 hours. No results for input(s): AMMONIA in the last 168 hours. Coagulation Profile:  Recent Labs Lab 10/08/15 0639 10/09/15 0433 10/10/15 0500 10/11/15 0254 10/12/15 0437  INR 1.41 1.37 1.30 1.22 1.31   Cardiac Enzymes:  Recent Labs Lab 10/05/15 2120 10/06/15  0510 10/10/15 1620 10/10/15 1627 10/11/15 0254  TROPONINI 0.03 0.03 0.04* <0.03 0.05*   BNP (last 3 results)  Recent Labs  07/22/15 1503  PROBNP >5000.0*   HbA1C: No results for input(s): HGBA1C in the last 72 hours. CBG:  Recent Labs Lab 10/06/15 0801 10/06/15 1146 10/06/15 1558 10/06/15 2041 10/07/15 0034  GLUCAP 90 80 116* 149* 123*   Lipid Profile: No results for input(s): CHOL, HDL, LDLCALC, TRIG, CHOLHDL, LDLDIRECT in the last 72 hours. Thyroid Function Tests: No results for input(s): TSH, T4TOTAL, FREET4, T3FREE, THYROIDAB in the last 72 hours. Anemia Panel: No results for input(s): VITAMINB12, FOLATE, FERRITIN, TIBC, IRON, RETICCTPCT in the last 72 hours. Urine analysis:    Component Value Date/Time   COLORURINE YELLOW 10/05/2015 1233   APPEARANCEUR TURBID* 10/05/2015 1233   LABSPEC 1.019 10/05/2015 1233   LABSPEC 1.010 06/21/2013 1213   PHURINE 5.0 10/05/2015 1233   PHURINE 6.0 06/21/2013 1213   GLUCOSEU NEGATIVE 10/05/2015 1233   GLUCOSEU NEGATIVE 03/20/2014 1454   GLUCOSEU Negative 06/21/2013 1213   HGBUR LARGE* 10/05/2015 1233   HGBUR Negative 06/21/2013 1213   BILIRUBINUR NEGATIVE 10/05/2015 1233   BILIRUBINUR Negative 06/21/2013 1213   KETONESUR NEGATIVE 10/05/2015 1233   KETONESUR Negative 06/21/2013 1213   PROTEINUR 100* 10/05/2015 1233   PROTEINUR 100 06/21/2013 1213   UROBILINOGEN 0.2 03/20/2014 1454   UROBILINOGEN 0.2  06/21/2013 1213   NITRITE NEGATIVE 10/05/2015 1233   NITRITE Negative 06/21/2013 1213   LEUKOCYTESUR LARGE* 10/05/2015 1233   LEUKOCYTESUR Negative 06/21/2013 1213   Sepsis Labs: @LABRCNTIP (procalcitonin:4,lacticidven:4)  ) Recent Results (from the past 240 hour(s))  Urine culture     Status: Abnormal   Collection Time: 10/05/15 12:33 PM  Result Value Ref Range Status   Specimen Description URINE, CATHETERIZED  Final   Special Requests NONE  Final   Culture (A)  Final    >=100,000 COLONIES/mL GROUP B STREP(S.AGALACTIAE)ISOLATED TESTING AGAINST S. AGALACTIAE NOT ROUTINELY PERFORMED DUE TO PREDICTABILITY OF AMP/PEN/VAN SUSCEPTIBILITY.    Report Status 10/06/2015 FINAL  Final  MRSA PCR Screening     Status: None   Collection Time: 10/05/15  5:38 PM  Result Value Ref Range Status   MRSA by PCR NEGATIVE NEGATIVE Final    Comment:        The GeneXpert MRSA Assay (FDA approved for NASAL specimens only), is one component of a comprehensive MRSA colonization surveillance program. It is not intended to diagnose MRSA infection nor to guide or monitor treatment for MRSA infections.          Radiology Studies: Dg Abd Portable 1v  10/10/2015  CLINICAL DATA:  Right lower abdominal pain today. Constipation. Initial encounter. EXAM: PORTABLE ABDOMEN - 1 VIEW COMPARISON:  None. FINDINGS: The bowel gas pattern is normal. Mildly prominent stool burden in the rectosigmoid colon is noted. No radio-opaque calculi or other significant radiographic abnormality are seen. IMPRESSION: No acute abnormality.  Mildly prominent rectosigmoid stool burden. Electronically Signed   By: Inge Rise M.D.   On: 10/10/2015 15:10        Scheduled Meds: . allopurinol  100 mg Oral Daily  . antiseptic oral rinse  7 mL Mouth Rinse q12n4p  . aspirin EC  81 mg Oral Daily  . brimonidine  1 drop Both Eyes BID  . bumetanide  2 mg Intravenous TID  . buPROPion  150 mg Oral Daily  . carvedilol  12.5 mg  Oral BID WC  . chlorhexidine  15 mL Mouth Rinse BID  .  cholecalciferol  1,000 Units Oral BID  . gabapentin  200 mg Oral TID  . hydrALAZINE  50 mg Oral TID  . isosorbide mononitrate  60 mg Oral Daily  . levothyroxine  137 mcg Oral QAC breakfast  . mometasone-formoterol  2 puff Inhalation BID  . pantoprazole  40 mg Oral BID  . polyethylene glycol  17 g Oral Daily  . predniSONE  5 mg Oral Q breakfast  . risperiDONE  0.5 mg Oral BID  . timolol  1 drop Both Eyes BID   Continuous Infusions: . sodium chloride 10 mL/hr at 10/11/15 0300     LOS: 7 days    Time spent: 40 minutes    WOODS, Geraldo Docker, MD [facility] Pager [phone]   If 7PM-7AM, please contact night-coverage [email or web address] Password [facility] 10/12/2015, 8:17 AM

## 2015-10-13 ENCOUNTER — Inpatient Hospital Stay (HOSPITAL_COMMUNITY): Payer: Medicare Other

## 2015-10-13 DIAGNOSIS — D649 Anemia, unspecified: Secondary | ICD-10-CM | POA: Diagnosis present

## 2015-10-13 LAB — BASIC METABOLIC PANEL
ANION GAP: 18 — AB (ref 5–15)
BUN: 53 mg/dL — AB (ref 6–20)
CHLORIDE: 93 mmol/L — AB (ref 101–111)
CO2: 32 mmol/L (ref 22–32)
Calcium: 9.2 mg/dL (ref 8.9–10.3)
Creatinine, Ser: 2.57 mg/dL — ABNORMAL HIGH (ref 0.44–1.00)
GFR, EST AFRICAN AMERICAN: 20 mL/min — AB (ref >60)
GFR, EST NON AFRICAN AMERICAN: 17 mL/min — AB (ref >60)
Glucose, Bld: 102 mg/dL — ABNORMAL HIGH (ref 65–99)
POTASSIUM: 4 mmol/L (ref 3.5–5.1)
SODIUM: 143 mmol/L (ref 135–145)

## 2015-10-13 LAB — CBC WITH DIFFERENTIAL/PLATELET
BASOS ABS: 0.1 10*3/uL (ref 0.0–0.1)
Basophils Relative: 1 %
EOS ABS: 0.4 10*3/uL (ref 0.0–0.7)
Eosinophils Relative: 3 %
HCT: 37.8 % (ref 36.0–46.0)
Hemoglobin: 10.4 g/dL — ABNORMAL LOW (ref 12.0–15.0)
LYMPHS PCT: 6 %
Lymphs Abs: 0.8 10*3/uL (ref 0.7–4.0)
MCH: 21.4 pg — AB (ref 26.0–34.0)
MCHC: 27.5 g/dL — AB (ref 30.0–36.0)
MCV: 77.8 fL — ABNORMAL LOW (ref 78.0–100.0)
MONOS PCT: 6 %
Monocytes Absolute: 0.8 10*3/uL (ref 0.1–1.0)
NEUTROS ABS: 10.4 10*3/uL — AB (ref 1.7–7.7)
Neutrophils Relative %: 84 %
PLATELETS: 284 10*3/uL (ref 150–400)
RBC: 4.86 MIL/uL (ref 3.87–5.11)
RDW: 23.2 % — ABNORMAL HIGH (ref 11.5–15.5)
WBC: 12.5 10*3/uL — AB (ref 4.0–10.5)

## 2015-10-13 LAB — BLOOD GAS, ARTERIAL
ACID-BASE EXCESS: 6.8 mmol/L — AB (ref 0.0–2.0)
BICARBONATE: 30.7 meq/L — AB (ref 20.0–24.0)
DRAWN BY: 129711
FIO2: 0.45
O2 Saturation: 97.6 %
PATIENT TEMPERATURE: 98.6
PCO2 ART: 43.1 mmHg (ref 35.0–45.0)
PH ART: 7.466 — AB (ref 7.350–7.450)
TCO2: 32 mmol/L (ref 0–100)
pO2, Arterial: 95.7 mmHg (ref 80.0–100.0)

## 2015-10-13 LAB — PROTIME-INR
INR: 1.26 (ref 0.00–1.49)
Prothrombin Time: 15.9 seconds — ABNORMAL HIGH (ref 11.6–15.2)

## 2015-10-13 LAB — MAGNESIUM: MAGNESIUM: 2 mg/dL (ref 1.7–2.4)

## 2015-10-13 MED ORDER — BUMETANIDE 0.25 MG/ML IJ SOLN
2.0000 mg | Freq: Every day | INTRAMUSCULAR | Status: DC
  Administered 2015-10-14: 2 mg via INTRAVENOUS
  Filled 2015-10-13: qty 8

## 2015-10-13 MED ORDER — LEVOTHYROXINE SODIUM 100 MCG IV SOLR
62.5000 ug | Freq: Every day | INTRAVENOUS | Status: DC
  Administered 2015-10-13: 62.5 ug via INTRAVENOUS
  Administered 2015-10-14: 6.25 ug via INTRAVENOUS
  Filled 2015-10-13 (×2): qty 5

## 2015-10-13 MED ORDER — METOPROLOL TARTRATE 5 MG/5ML IV SOLN
7.5000 mg | INTRAVENOUS | Status: DC
  Administered 2015-10-13 – 2015-10-14 (×7): 7.5 mg via INTRAVENOUS
  Filled 2015-10-13 (×7): qty 10

## 2015-10-13 MED ORDER — HYDRALAZINE HCL 20 MG/ML IJ SOLN
5.0000 mg | INTRAMUSCULAR | Status: DC
  Administered 2015-10-13 – 2015-10-14 (×7): 5 mg via INTRAVENOUS
  Filled 2015-10-13 (×7): qty 1

## 2015-10-13 NOTE — Progress Notes (Signed)
Patient currently has some breakdown on nose. No CPAP use at this time, Will setup if need arises.

## 2015-10-13 NOTE — Progress Notes (Addendum)
PROGRESS NOTE    Wendy Cole  [id number] DOB: Jan 08, 1936 DOA: 10/05/2015 PCP: Walker Kehr, MD   Brief Narrative:  80 y.o. WF PMHx Anxiety, Depression,CVA, CAD native artery, Paroxysmal Atrial Fibrillation on Warfarin Rx, Chronic Diastolic CHF/Cardiomyopathy, NSTEMI, HTN, Stage III CKD, Hypothyroid, OSA and hx of Breast Cancer S/P Rt. Mastectomy HLD, DVT,   Who presents to the ED , presents to the Emergency Department today complaining of BLE swelling and shortness of breath. Noted increasing weakness since DC from hospital on 07-30-15 due to syncopal episode. Seen Neurology on 09-05-15 for follow up with no neurologic cause. Pt notes inability to ambulate due to weakness and called EMS as she was unable to get off of the toilet. Started using wheelchair in the past week due to weakness. Pt also notes right shoulder pain s/p mechanical fall earlier this week with no head trauma. Notes pain is 5/10 and aching. Pt has Chest tightness with SOB. No CP. No N/V/D. No headaches. No fevers. No hematochezia. No black tarry stools. No other symptoms noted.  Occult blood positive In ED. Patient has not noticed blood in urine or Stool.states allergic to Lasix will cause her extreme muscle spasms ~ 30-45 minutes post administration..states baseline weight is ~218 pounds(99 kg). States does not weigh herself daily   Assessment & Plan:   Active Problems:   Essential hypertension   History of DVT (deep vein thrombosis)   OSA on CPAP   Anemia   CKD (chronic kidney disease) stage 3, GFR 30-59 ml/min   Stroke (HCC)   Chronic anticoagulation   Hypothyroidism   Chronic venous embolism and thrombosis of deep vessels of lower extremity (HCC)   Supratherapeutic INR   Lower GI bleed   Diabetes mellitus type 2, uncontrolled, with complications (HCC)   Anxiety   Depression   CAD in native artery   Paroxysmal atrial fibrillation (HCC)   Chronic diastolic CHF (congestive heart failure) (HCC)  Cardiomyopathy (HCC)   HLD (hyperlipidemia)   CHF, acute on chronic (HCC)   Bleeding gastrointestinal   Urinary tract infection, site not specified   Controlled diabetes mellitus type 2 with complications (HCC)   Chronic kidney disease, stage III (moderate)   Abdominal pain   Altered mental state   Edema of both legs   Psychotic episode   Pulmonary edema   Absolute anemia  Staph Agalactiae UTI  -Completed 5 day course Rocephin   Leukocytosis -5/21 overnight patient with fever and new leukocytosis. Leukocytosis may be just volume contraction secondary to diuresis. However since patient continues to have AMS will panculture.  -Will not start antibiotics unless positive cultures or patient continues to spike fever/increasing leukocytosis  Lower GI bleed -On admissions patient's hemoglobin 5.8, positive occult blood -5/13 transfuse 3 units PRBC -GI consulted; secondary to patient's poorly controlled respiratory/cardiac status GI deferred colonoscopy or EGD at this time. Will follow from a distance. -Stable hemoglobin most likely secondary to supratherapeutic INR  Supratherapeutic INR -INR = 6.7 at admission; - 5/17 WNL Lab 10/08/15 0544 10/09/15 0433 10/10/15 0905 10/13/15 0443  HGB 8.1* 8.3* 8.3* 10.4*    Chronic diastolic CHF/Cardiomyopathy -Echocardiogram; confirms dilated cardiomyopathy, diastolic CHF see echocardiogram below -Fluid overloaded (edematous to hips) but significantly improved from admission. -strict in and out since admission -23.3 L -Daily weight Filed Weights   10/11/15 0304 10/12/15 0221 10/13/15 0123  Weight: 91 kg (200 lb 9.9 oz) 88.3 kg (194 lb 10.7 oz) 87.7 kg (193 lb 5.5 oz)  -Metoprolol IV 7.5mg   q 4hr; restart Coreg 12.5 mg BID when patient's mental status improves. -Hydralazine IV 5 mg q 4hr; restart Hydralazine 50 mg TID when patient's bowel status improves -Imdur 60 mg daily -Decrease Bumex 2 mg IV DAILY; patient's Cr trending up, believe  have reached maximal diuretic state.  -Out of bed to chair q shift  Paroxysmal Atrial fibrillation -A. fib, rate controlled; has been in NSR most of the time.  -See cardiomyopathy -Coumadin on hold, will discuss with family if we should restart Coumadin given patient's wish to consume food as she likes.  OSA -5/21 overnight BIPAP on 16/3 With O2 bleed in  DM type 2 Controlled with complications; Diabetic neuropathy -5/13 A1c= <4.3 -Neurontin 200 mg TID (home dose Neurontin 400 mg BID), however patient just laid on couch per son. Titrate to effect -5/21 continued AMS/somnolence. DC Neurontin  HLD -Lipid panel within ADA guidelines   Acute on CKD stage III (Baseline~1.82-2.12) Lab Results  Component Value Date   CREATININE 2.57* 10/13/2015   CREATININE 2.30* 10/12/2015   CREATININE 2.43* 10/11/2015  -improving with diuresis  Hypothyroidism -Synthroid IV 62.5 g; restart Synthroid 137 g when patient's cognition improved. -TSH; 1.89  DVT -Patient had been on Coumadin unfortunately with GI bleed will hold  AMS/Steroid-induced Psychosis? 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 Since patient on minimal steroids and family unsure why she is on steroids and unable to determine from records reason for steroids DC prednisone -DC Percocet -Minimize all sedating medication. -5/21 MRI brain negative acute infarct. Multiple remote infarcts see results below -Will obtain EEG although only a remote possibility of seizure activity.  Anxiety/Agitation/Psychotic episode -5/19 consulted psychiatry. -5/ 20 decrease Risperdal 0.25 mg BID -See DM neuropathy  Depression -Continue Wellbutrin 150 mg daily  Hypokalemia -Potassium goal> 4   Hypomagnesemia -Magnesium goal> 2  Abdominal pain -After consuming full breakfast which consisted of eggs, sausage  patient now has abdominal pain/nausea -Consult to nutrition  place on strict 2000-calorie/day diet -KUB; showed stool burden rectosigmoid colon, start MiraLAX daily -NPO for now.        Goals of care -Spoke at length with patient and son concerning goals of care patient verified that she should be DO NOT RESUSCITATE. -Patient also knowledge that she ate hamburgers, french fries, diet Cokes at home which is what made her happy. Spoke at length with patient and son concerning what her wishes would be upon discharge to include possible palliative care. Patient will discuss with family    DVT prophylaxis: SCD Code Status: DO NOT RESUSCITATE Family Communication: No family available Disposition Plan: Resolution acute on chronic diastolic CHF/GI bleed   Consultants:  Dr.William Stacie [facility]    Procedures/Significant Events:  PICC 5/13 5/14 echocardiogram;- Left ventricle:-50% to 55%. -(grade 2 diastolic dysfunction). - Aortic valve: moderately restricted. mild to moderate regurgitation - Left atrium: moderately dilated.- Right ventricle: moderately dilated. -Right atrium: moderately dilated. -Tricuspid valve: moderate regurgitation. - Pulmonary arteries: PA peak pressure: 57 mm Hg (S). 5/16 bilateral lower extremity Doppler; negative DVT 5/18 KUB; negative ileus/SBO- stool burden rectosigmoid colon 5/21 MRA brain Wo contrast;-No acute intracranial abnormality. -chronic microvascular ischemia. -Remote cortical infarcts involving the posterior left frontal lobe in the right temporal lobe. -Remote cortical infarct adjacent tothe occipital horn of the left lateral ventricle.   Cultures 5/13 urine positive GROUP B STREP(S.AGALACTIAE 5/13 MRSA by PCR negative  Antimicrobials: Ceftriaxone 5/14>>5/19   Devices    LINES /  TUBES:      Continuous Infusions: . sodium chloride 20 mL/hr at 10/13/15 1000     Subjective: 5/21, patient very lethargic sleeping the BiPAP. Will slightly arouse with moderate stimulation. Does not follow  commands. Patient's family unsure why she is on steroids. Tmax overnight 38C     Objective: Filed Vitals:   10/13/15 1230 10/13/15 1300 10/13/15 1307 10/13/15 1330  BP: 151/49 157/54 157/54 165/34  Pulse: 62 63    Temp:   99 F (37.2 C)   TempSrc:   Axillary   Resp: 26 25 26 27   Height:      Weight:      SpO2: 100% 100%  100%    Intake/Output Summary (Last 24 hours) at 10/13/15 1510 Last data filed at 10/13/15 1400  Gross per 24 hour  Intake    350 ml  Output   2450 ml  Net  -2100 ml   Filed Weights   10/11/15 0304 10/12/15 0221 10/13/15 0123  Weight: 91 kg (200 lb 9.9 oz) 88.3 kg (194 lb 10.7 oz) 87.7 kg (193 lb 5.5 oz)    Examination:  General: Patient sleeping in bed will arouse to moderate stimuli, answers yes/no to simple questions but does not follow commands with draws to pain when pressure applies to legs, No acute respiratory distress Eyes: negative scleral hemorrhage, negative anisocoria, negative icterus ENT: Negative Runny nose, negative gingival bleeding, Neck:  Negative scars, masses, torticollis, lymphadenopathy, JVD Lungs: Clear to auscultation bilaterally without wheezes or crackles Cardiovascular: Regular rate and rhythm without murmur gallop or rub normal S1 and S2 Abdomen: Morbidly obese, positive abdominal pain with palpation, nondistended, positive soft, bowel sounds, no rebound, no ascites, no appreciable mass Extremities: No significant cyanosis, clubbing, bilateral pitting edema 1+ mid thigh Skin: Negative rashes, lesions, ulcers Psychiatric:  Poor understanding of disease process; awaiting psychiatry evaluation Central nervous system:  Unable to fully assess secondary to patient's lethargy.       Data Reviewed: Care during the described time interval was provided by me .  I have reviewed this patient's available data, including medical history, events of note, physical examination, and all test results as part of my evaluation. I have  personally reviewed and interpreted all radiology studies.  CBC:  Recent Labs Lab 10/07/15 0308 10/08/15 0544 10/09/15 0433 10/10/15 0905 10/13/15 0443  WBC 8.4 9.3 10.1 8.3 12.5*  NEUTROABS  --   --   --  6.6 10.4*  HGB 7.9* 8.1* 8.3* 8.3* 10.4*  HCT 27.6* 28.3* 29.6* 28.9* 37.8  MCV 74.8* 74.7* 75.3* 75.5* 77.8*  PLT 252 246 254 203 [id number]   Basic Metabolic Panel:  Recent Labs Lab 10/09/15 0433  10/10/15 0905 10/10/15 1620 10/10/15 1835 10/11/15 1510 10/12/15 0437 10/13/15 0443  NA 138  --  132*  --   --  141 144 143  K 3.3*  < > 3.3* >7.5* 4.5 4.3 4.2 4.0  CL 100*  --  96*  --   --  97* 96* 93*  CO2 25  --  25  --   --  31 33* 32  GLUCOSE 94  --  86  --   --  133* 95 102*  BUN 50*  --  43*  --   --  47* 51* 53*  CREATININE 2.32*  --  1.96*  --   --  2.43* 2.30* 2.57*  CALCIUM 8.1*  --  7.6*  --   --  8.7* 8.8* 9.2  MG  --   < >  1.5* 2.3  --  2.1 2.0 2.0  < > = values in this interval not displayed. GFR: Estimated Creatinine Clearance: 20.5 mL/min (by C-G formula based on Cr of 2.57). Liver Function Tests:  Recent Labs Lab 10/07/15 0308 10/08/15 0544 10/10/15 0905  AST 16 14* 15  ALT 14 13* 13*  ALKPHOS 70 68 66  BILITOT 1.4* 0.9 1.2  PROT 5.4* 5.2* 4.6*  ALBUMIN 2.6* 2.5* 2.2*   No results for input(s): LIPASE, AMYLASE in the last 168 hours. No results for input(s): AMMONIA in the last 168 hours. Coagulation Profile:  Recent Labs Lab 10/09/15 0433 10/10/15 0500 10/11/15 0254 10/12/15 0437 10/13/15 0443  INR 1.37 1.30 1.22 1.31 1.26   Cardiac Enzymes:  Recent Labs Lab 10/10/15 1620 10/10/15 1627 10/11/15 0254  TROPONINI 0.04* <0.03 0.05*   BNP (last 3 results)  Recent Labs  07/22/15 1503  PROBNP >5000.0*   HbA1C: No results for input(s): HGBA1C in the last 72 hours. CBG:  Recent Labs Lab 10/06/15 1558 10/06/15 2041 10/07/15 0034 10/12/15 0727  GLUCAP 116* 149* 123* 89   Lipid Profile: No results for input(s): CHOL, HDL,  LDLCALC, TRIG, CHOLHDL, LDLDIRECT in the last 72 hours. Thyroid Function Tests: No results for input(s): TSH, T4TOTAL, FREET4, T3FREE, THYROIDAB in the last 72 hours. Anemia Panel: No results for input(s): VITAMINB12, FOLATE, FERRITIN, TIBC, IRON, RETICCTPCT in the last 72 hours. Urine analysis:    Component Value Date/Time   COLORURINE YELLOW 10/05/2015 1233   APPEARANCEUR TURBID* 10/05/2015 1233   LABSPEC 1.019 10/05/2015 1233   LABSPEC 1.010 06/21/2013 1213   PHURINE 5.0 10/05/2015 1233   PHURINE 6.0 06/21/2013 1213   GLUCOSEU NEGATIVE 10/05/2015 1233   GLUCOSEU NEGATIVE 03/20/2014 1454   GLUCOSEU Negative 06/21/2013 1213   HGBUR LARGE* 10/05/2015 1233   HGBUR Negative 06/21/2013 1213   BILIRUBINUR NEGATIVE 10/05/2015 1233   BILIRUBINUR Negative 06/21/2013 1213   KETONESUR NEGATIVE 10/05/2015 1233   KETONESUR Negative 06/21/2013 1213   PROTEINUR 100* 10/05/2015 1233   PROTEINUR 100 06/21/2013 1213   UROBILINOGEN 0.2 03/20/2014 1454   UROBILINOGEN 0.2 06/21/2013 1213   NITRITE NEGATIVE 10/05/2015 1233   NITRITE Negative 06/21/2013 1213   LEUKOCYTESUR LARGE* 10/05/2015 1233   LEUKOCYTESUR Negative 06/21/2013 1213   Sepsis Labs: @LABRCNTIP (procalcitonin:4,lacticidven:4)  ) Recent Results (from the past 240 hour(s))  Urine culture     Status: Abnormal   Collection Time: 10/05/15 12:33 PM  Result Value Ref Range Status   Specimen Description URINE, CATHETERIZED  Final   Special Requests NONE  Final   Culture (A)  Final    >=100,000 COLONIES/mL GROUP B STREP(S.AGALACTIAE)ISOLATED TESTING AGAINST S. AGALACTIAE NOT ROUTINELY PERFORMED DUE TO PREDICTABILITY OF AMP/PEN/VAN SUSCEPTIBILITY.    Report Status 10/06/2015 FINAL  Final  MRSA PCR Screening     Status: None   Collection Time: 10/05/15  5:38 PM  Result Value Ref Range Status   MRSA by PCR NEGATIVE NEGATIVE Final    Comment:        The GeneXpert MRSA Assay (FDA approved for NASAL specimens only), is one component  of a comprehensive MRSA colonization surveillance program. It is not intended to diagnose MRSA infection nor to guide or monitor treatment for MRSA infections.          Radiology Studies: Ct Head Wo Contrast  10/12/2015  CLINICAL DATA:  Altered mental status EXAM: CT HEAD WITHOUT CONTRAST TECHNIQUE: Contiguous axial images were obtained from the base of the skull through the vertex without  intravenous contrast. COMPARISON:  10/07/2015 FINDINGS: No evidence of parenchymal hemorrhage or extra-axial fluid collection. No mass lesion, mass effect, or midline shift. No CT evidence of acute infarction. Subcortical white matter and periventricular small vessel ischemic changes, particularly along the occipital horns of the lateral ventricles. Cortical and central atrophy.  Secondary mild ventriculomegaly. The visualized paranasal sinuses are essentially clear. The mastoid air cells are unopacified. No evidence of calvarial fracture. IMPRESSION: No evidence of acute intracranial abnormality. Atrophy with small vessel ischemic changes. Electronically Signed   By: Julian Hy M.D.   On: 10/12/2015 16:20   Mr Brain Wo Contrast  10/13/2015  CLINICAL DATA:  Progressive lower extremity weakness following syncopal episode three months ago. Mechanical fall earlier this week. Unable to get up from the commode yesterday. EXAM: MRI HEAD WITHOUT CONTRAST TECHNIQUE: Multiplanar, multiecho pulse sequences of the brain and surrounding structures were obtained without intravenous contrast. COMPARISON:  CT head without contrast 10/12/2015. FINDINGS: Diffusion-weighted images demonstrate no evidence for acute or subacute infarction. Mild generalized atrophy and and white matter disease is somewhat advanced for age. A remote cortical infarct is again seen in the posterior right temporal lobe. Areas of remote cortical infarction are present in the posterior left frontal lobe. A remote white matter and cortical infarct  is evident inferior to the occipital horn of the left lateral ventricle. No acute hemorrhage or mass lesion is present. The internal auditory canals are normal bilaterally. The brainstem is within normal limits. Cerebellar atrophy is proportionate to the supratentorial atrophy. Flow is present in the major intracranial arteries. Bilateral lens replacements are present. The globes and orbits are intact. The paranasal sinuses are clear. There is some fluid in the mastoid air cells bilaterally, left greater than right. No obstructing nasopharyngeal lesion is evident. There is some fluid in the left posterior nasopharynx. IMPRESSION: 1. No acute intracranial abnormality. 2. Mild age advanced atrophy and diffuse white matter disease likely reflects the sequela of chronic microvascular ischemia. 3. Remote cortical infarcts involving the posterior left frontal lobe in the right temporal lobe. Remote cortical infarct adjacent to the occipital horn of the left lateral ventricle. Electronically Signed   By: San Morelle M.D.   On: 10/13/2015 14:50        Scheduled Meds: . allopurinol  100 mg Oral Daily  . antiseptic oral rinse  7 mL Mouth Rinse q12n4p  . aspirin EC  81 mg Oral Daily  . brimonidine  1 drop Both Eyes BID  . [START ON 10/14/2015] bumetanide  2 mg Intravenous Daily  . buPROPion  150 mg Oral Daily  . chlorhexidine  15 mL Mouth Rinse BID  . cholecalciferol  1,000 Units Oral BID  . hydrALAZINE  5 mg Intravenous Q4H  . isosorbide mononitrate  60 mg Oral Daily  . levothyroxine  62.5 mcg Intravenous Daily  . metoprolol  7.5 mg Intravenous Q4H  . mometasone-formoterol  2 puff Inhalation BID  . pantoprazole  40 mg Oral BID  . polyethylene glycol  17 g Oral Daily  . risperiDONE  0.25 mg Oral BID  . timolol  1 drop Both Eyes BID   Continuous Infusions: . sodium chloride 20 mL/hr at 10/13/15 1000     LOS: 8 days    Time spent: 40 minutes    WOODS, Geraldo Docker, MD [facility] Pager [phone]   If 7PM-7AM, please contact night-coverage [email or web address] Password [email or web address] 10/13/2015, 3:10 PM

## 2015-10-13 NOTE — Progress Notes (Signed)
Patient hs breakdown spot on nose and is unable to wear CPAP mask comfortably. Will do what's necessary if the issue arises. Patient resting comfortably at this time and CPAP QHS not needed

## 2015-10-13 NOTE — Progress Notes (Signed)
RT unable to obtain ABG. RN aware.

## 2015-10-14 ENCOUNTER — Encounter: Payer: Self-pay | Admitting: Internal Medicine

## 2015-10-14 ENCOUNTER — Inpatient Hospital Stay (HOSPITAL_COMMUNITY): Payer: Medicare Other

## 2015-10-14 DIAGNOSIS — R410 Disorientation, unspecified: Secondary | ICD-10-CM

## 2015-10-14 LAB — BASIC METABOLIC PANEL
ANION GAP: 15 (ref 5–15)
BUN: 65 mg/dL — ABNORMAL HIGH (ref 6–20)
CALCIUM: 8.9 mg/dL (ref 8.9–10.3)
CHLORIDE: 99 mmol/L — AB (ref 101–111)
CO2: 31 mmol/L (ref 22–32)
CREATININE: 2.75 mg/dL — AB (ref 0.44–1.00)
GFR calc non Af Amer: 16 mL/min — ABNORMAL LOW (ref >60)
GFR, EST AFRICAN AMERICAN: 18 mL/min — AB (ref >60)
Glucose, Bld: 97 mg/dL (ref 65–99)
Potassium: 3.7 mmol/L (ref 3.5–5.1)
SODIUM: 145 mmol/L (ref 135–145)

## 2015-10-14 LAB — URINE CULTURE

## 2015-10-14 LAB — MAGNESIUM: MAGNESIUM: 2 mg/dL (ref 1.7–2.4)

## 2015-10-14 LAB — PROTIME-INR
INR: 1.4 (ref 0.00–1.49)
Prothrombin Time: 17.2 seconds — ABNORMAL HIGH (ref 11.6–15.2)

## 2015-10-14 MED ORDER — MAGNESIUM CITRATE PO SOLN
0.5000 | Freq: Once | ORAL | Status: DC | PRN
  Filled 2015-10-14: qty 296

## 2015-10-14 MED ORDER — BISACODYL 10 MG RE SUPP: 10.0000 mg | Freq: Every day | RECTAL | Status: DC | PRN

## 2015-10-14 MED ORDER — HYDRALAZINE HCL 25 MG PO TABS
25.0000 mg | ORAL_TABLET | Freq: Four times a day (QID) | ORAL | Status: DC
  Administered 2015-10-14 – 2015-10-30 (×55): 25 mg via ORAL
  Filled 2015-10-14 (×52): qty 1

## 2015-10-14 MED ORDER — METOPROLOL TARTRATE 5 MG/5ML IV SOLN
10.0000 mg | INTRAVENOUS | Status: DC
  Administered 2015-10-14 – 2015-10-19 (×23): 10 mg via INTRAVENOUS
  Filled 2015-10-14 (×24): qty 10

## 2015-10-14 MED ORDER — SIMETHICONE 40 MG/0.6ML PO SUSP
40.0000 mg | Freq: Four times a day (QID) | ORAL | Status: DC | PRN
  Administered 2015-10-29: 40 mg via ORAL
  Filled 2015-10-14 (×3): qty 0.6

## 2015-10-14 MED ORDER — LEVOTHYROXINE SODIUM 25 MCG PO TABS
137.0000 ug | ORAL_TABLET | Freq: Every day | ORAL | Status: DC
  Administered 2015-10-15 – 2015-10-30 (×17): 137 ug via ORAL
  Filled 2015-10-14 (×17): qty 1

## 2015-10-14 MED ORDER — BISACODYL 10 MG RE SUPP
10.0000 mg | Freq: Once | RECTAL | Status: AC
  Administered 2015-10-14: 10 mg via RECTAL
  Filled 2015-10-14: qty 1

## 2015-10-14 NOTE — Progress Notes (Signed)
Wendy Cole  [id number] DOB: 1935-12-26 DOA: 10/05/2015 PCP: Walker Kehr, MD    Brief Narrative:  80yo F HxAnxiety, Depression, CVA, CAD, Paroxysmal Atrial Fibrillation on Warfarin, Chronic Severe Diastolic CHF, HTN, Stage III CKD, Hypothyroid, HLD, DVT, OSA, and Breast Cancer S/P R Mastectomy who presented to the ED complaining of B LE swelling and shortness of breath, with increasing weakness since DC from hospital on 07-30-15 due to a syncopal episode. She called EMS after she was unable to get off the toilet.   In the ED the patient was grossly volume overloaded on exam, and guiac positive w/ brown stool.  She was found to have a Hgb of 5.8, and an INR of 5.8  Assessment & Plan:  Obtundation - Toxic metabolic encephalopathy - Agitation - Psychosis  B12, folate, and ammonia recently normal - UTI likely contributing - mental status has been slow to normalize - Psych has evaluated and suggests this may be steroid induces delirium - now off steroid - mental status appears to be slowly improving   Staph agalactiae UTI  Has completed a course of rocephin   GI bleed no evidence of large volume gross blood loss - stool was brown but guiac + - INR was supratherapeutic - empiric PPI - will need GI eval when more stable if decision is made to resume anticoag    Acute blood loss anemia  Hgb 7.9 07/27/15 - now s/p 3U PRBC - stable at this time w/o evidence of ongoing blood loss    Recent Labs Lab 10/08/15 0544 10/09/15 0433 10/10/15 0905 10/13/15 0443  HGB 8.1* 8.3* 8.3* 10.4*    Supratherapeutic INR corrected w/ vitamin K - INR normalized   Recent Labs Lab 10/10/15 0500 10/11/15 0254 10/12/15 0437 10/13/15 0443 10/14/15 0540  INR 1.30 1.22 1.31 1.26 1.40    Acute exacerbation of Chronic grade 2 diastolic CHF baseline weight is 93kg (07/27/15) - net negative ~24L since admit - pt is now euvolemic to even a little DH at this time    [facility] Weights   10/12/15 0221 10/13/15 0123 10/14/15 0500  Weight: 88.3 kg (194 lb 10.7 oz) 87.7 kg (193 lb 5.5 oz) 81.9 kg (180 lb 8.9 oz)    CKD stage III crt 2.33 at time of d/c 07/27/15 - somewhat DH at present - holding on further diuresis   Recent Labs Lab 10/09/15 0433 10/10/15 0905 10/11/15 1510 10/12/15 0437 10/13/15 0443 10/14/15 0540  CREATININE 2.32* 1.96* 2.43* 2.30* 2.57* 2.75*    Paroxysmal Atrial fibrillation currently in NSR - anticoag (warfarin) on hold due to GIB - will need GI eval prior to resuming if decision is made to do so   OSA BIPAP QHS and when napping during day   DM 2  A1c erroneous (<4.3) - CBG well controlled   HLD  Hypothyroidism continue Synthroid   Remote hx of DVT most recent duplex in 2014 and was negative for DVT - recheck this admit w/o DVT    Anxiety  Depression  Morbid obesity   DVT prophylaxis: SCDs Code Status: NO CODE  Family Communication: spoke w/ son at bedside   Disposition Plan: SDU  Consultants:  CHF Team [facility]   Procedures:  PICC 5/13 B LE venous duplex - 5/16 - negative for DVT   Antimicrobials:  Ceftriaxone 5/14 > 5/19  Subjective: The pt is alert and conversant, but remains confused.  She c/o constipation, and some nausea.  She denies cp, sob, or vomiting.    Objective: Blood pressure 186/32, pulse 32, temperature 97.7 F (36.5 C), temperature source Axillary, resp. rate 19, height 5\' 6"  (1.676 m), weight 81.9 kg (180 lb 8.9 oz), SpO2 90 %.  Intake/Output Summary (Last 24 hours) at 10/14/15 1733 Last data filed at 10/14/15 0300  Gross per 24 hour  Intake    160 ml  Output    825 ml  Net   -665 ml   Filed Weights   10/12/15 0221 10/13/15 0123 10/14/15 0500  Weight: 88.3 kg (194 lb 10.7 oz) 87.7 kg (193 lb 5.5 oz) 81.9 kg (180 lb 8.9 oz)    Examination: General: No acute resp distress in bed  Lungs: CTA th/o - no wheeze  Cardiovascular: Regular rate and rhythm without murmur or  gallup  Abdomen: Obese, nondistended, soft, bowel sounds+, no rebound Extremities: No significant cyanosis, or clubbing - no edema bilateral lower extremities   CBC:  Recent Labs Lab 10/08/15 0544 10/09/15 0433 10/10/15 0905 10/13/15 0443  WBC 9.3 10.1 8.3 12.5*  NEUTROABS  --   --  6.6 10.4*  HGB 8.1* 8.3* 8.3* 10.4*  HCT 28.3* 29.6* 28.9* 37.8  MCV 74.7* 75.3* 75.5* 77.8*  PLT 246 254 203 [id number]   Basic Metabolic Panel:  Recent Labs Lab 10/10/15 0905 10/10/15 1620 10/10/15 1835 10/11/15 1510 10/12/15 0437 10/13/15 0443 10/14/15 0540  NA 132*  --   --  141 144 143 145  K 3.3* >7.5* 4.5 4.3 4.2 4.0 3.7  CL 96*  --   --  97* 96* 93* 99*  CO2 25  --   --  31 33* 32 31  GLUCOSE 86  --   --  133* 95 102* 97  BUN 43*  --   --  47* 51* 53* 65*  CREATININE 1.96*  --   --  2.43* 2.30* 2.57* 2.75*  CALCIUM 7.6*  --   --  8.7* 8.8* 9.2 8.9  MG 1.5* 2.3  --  2.1 2.0 2.0 2.0   GFR: Estimated Creatinine Clearance: 18.5 mL/min (by C-G formula based on Cr of 2.75).  Liver Function Tests:  Recent Labs Lab 10/08/15 0544 10/10/15 0905  AST 14* 15  ALT 13* 13*  ALKPHOS 68 66  BILITOT 0.9 1.2  PROT 5.2* 4.6*  ALBUMIN 2.5* 2.2*   Coagulation Profile:  Recent Labs Lab 10/10/15 0500 10/11/15 0254 10/12/15 0437 10/13/15 0443 10/14/15 0540  INR 1.30 1.22 1.31 1.26 1.40    Cardiac Enzymes:  Recent Labs Lab 10/10/15 1620 10/10/15 1627 10/11/15 0254  TROPONINI 0.04* <0.03 0.05*   CBG:  Recent Labs Lab 10/12/15 0727  GLUCAP 89    Recent Results (from the past 240 hour(s))  Urine culture     Status: Abnormal   Collection Time: 10/05/15 12:33 PM  Result Value Ref Range Status   Specimen Description URINE, CATHETERIZED  Final   Special Requests NONE  Final   Culture (A)  Final    >=100,000 COLONIES/mL GROUP B STREP(S.AGALACTIAE)ISOLATED TESTING AGAINST S. AGALACTIAE NOT ROUTINELY PERFORMED DUE TO PREDICTABILITY OF AMP/PEN/VAN SUSCEPTIBILITY.    Report  Status 10/06/2015 FINAL  Final  MRSA PCR Screening     Status: None   Collection Time: 10/05/15  5:38 PM  Result Value Ref Range Status   MRSA by PCR NEGATIVE NEGATIVE Final    Comment:        The GeneXpert MRSA Assay (FDA approved for NASAL specimens only), is one  component of a comprehensive MRSA colonization surveillance program. It is not intended to diagnose MRSA infection nor to guide or monitor treatment for MRSA infections.   Culture, blood (routine x 2)     Status: None (Preliminary result)   Collection Time: 10/13/15  9:08 AM  Result Value Ref Range Status   Specimen Description BLOOD LEFT ARM  Final   Special Requests BOTTLES DRAWN AEROBIC ONLY 5CC  Final   Culture NO GROWTH 1 DAY  Final   Report Status PENDING  Incomplete  Culture, blood (routine x 2)     Status: None (Preliminary result)   Collection Time: 10/13/15  9:15 AM  Result Value Ref Range Status   Specimen Description BLOOD RIGHT HAND  Final   Special Requests BOTTLES DRAWN AEROBIC ONLY 10CC  Final   Culture NO GROWTH 1 DAY  Final   Report Status PENDING  Incomplete  Culture, Urine     Status: Abnormal   Collection Time: 10/13/15  5:19 PM  Result Value Ref Range Status   Specimen Description URINE, CATHETERIZED  Final   Special Requests NONE  Final   Culture >=100,000 COLONIES/mL YEAST (A)  Final   Report Status 10/14/2015 FINAL  Final     Scheduled Meds: . allopurinol  100 mg Oral Daily  . antiseptic oral rinse  7 mL Mouth Rinse q12n4p  . aspirin EC  81 mg Oral Daily  . brimonidine  1 drop Both Eyes BID  . bumetanide  2 mg Intravenous Daily  . buPROPion  150 mg Oral Daily  . chlorhexidine  15 mL Mouth Rinse BID  . cholecalciferol  1,000 Units Oral BID  . hydrALAZINE  5 mg Intravenous Q4H  . isosorbide mononitrate  60 mg Oral Daily  . levothyroxine  62.5 mcg Intravenous Daily  . metoprolol  7.5 mg Intravenous Q4H  . mometasone-formoterol  2 puff Inhalation BID  . pantoprazole  40 mg Oral BID   . polyethylene glycol  17 g Oral Daily  . risperiDONE  0.25 mg Oral BID  . timolol  1 drop Both Eyes BID   Continuous Infusions: . sodium chloride 10 mL/hr at 10/14/15 1032     LOS: 9 days   Time spent: 35 minutes   Cherene Altes, MD [facility] Office  [phone] Pager - Text Page per Shea Evans as per below:  On-Call/Text Page:      [email or web address]      password [email or web address]  If 7PM-7AM, please contact night-coverage [email or web address] Password [facility] 10/14/2015, 5:33 PM

## 2015-10-14 NOTE — Progress Notes (Signed)
EEG Completed; Results Pending  

## 2015-10-14 NOTE — Care Management Important Message (Signed)
Important Message  Patient Details  Name: Wendy Cole MRN: [id number] Date of Birth: 03-28-1936   Medicare Important Message Given:  Yes    Nathen May 10/14/2015, 1:26 PM

## 2015-10-14 NOTE — Procedures (Signed)
ELECTROENCEPHALOGRAM REPORT  Date of Study: 10/14/2015  Patient's Name: Wendy Cole MRN: [id number] Date of Birth: 26-Mar-1936  Referring Provider: Dia Crawford, MD  Indication: 80 year old woman with CVA, depression, HTN, stage 3 CKD, and PAF on warfarin with GI bleed and altered mental status  Medications: 0.45 % sodium chloride infusion acetaminophen (TYLENOL) suppository 650 mg acetaminophen (TYLENOL) tablet 650 mg albuterol (PROVENTIL) (2.5 MG/3ML) 0.083% nebulizer solution 2.5 mg allopurinol (ZYLOPRIM) tablet 100 mg antiseptic oral rinse (CPC / CETYLPYRIDINIUM CHLORIDE 0.05%) solution 7 mL aspirin EC tablet 81 mg brimonidine (ALPHAGAN) 0.2 % ophthalmic solution 1 drop bumetanide (BUMEX) injection 2 mg buPROPion (WELLBUTRIN SR) 12 hr tablet 150 mg chlorhexidine (PERIDEX) 0.12 % solution 15 mL cholecalciferol (VITAMIN D) tablet 1,000 Units hydrALAZINE (APRESOLINE) injection 5 mg isosorbide mononitrate (IMDUR) 24 hr tablet 60 mg levothyroxine (SYNTHROID, LEVOTHROID) injection 62.5 mcg metoprolol (LOPRESSOR) injection 7.5 mg mometasone-formoterol (DULERA) 100-5 MCG/ACT inhaler 2 puff ondansetron (ZOFRAN) injection 4 mg pantoprazole (PROTONIX) EC tablet 40 mg polyethylene glycol (MIRALAX / GLYCOLAX) packet 17 g prednisoLONE acetate (PRED FORTE) 1 % ophthalmic suspension 1 drop risperiDONE (RISPERDAL) tablet 0.25 mg sodium chloride flush (NS) 0.9 % injection 10-40 mL timolol (TIMOPTIC) 0.5 % ophthalmic solution 1 drop   Technical Summary: This is a multichannel digital EEG recording, using the international 10-20 placement system with electrodes applied with paste and impedances below 5000 ohms.    Description: The EEG background is diffusely slow with mixed 2-3 Hz delta and 5-6 Hz theta activity.  There are some triphasic waves noted.  No focal or generalized seizures or epileptiform discharges are noted.  Stage II sleep is not seen.  Hyperventilation and photic  stimulation were not performed.  ECG revealed irregular cardiac rhythm  Impression: This is an abnormal EEG due to generalized background slowing with triphasic waves, indicative of diffuse cerebral dysfunction.  This is a nonspecific finding which may be due to a toxic-metabolic or other diffuse physiologic abnormality.  Adam R. Tomi Likens, DO

## 2015-10-14 NOTE — Consult Note (Signed)
[facility] Face-to-Face Psychiatry Consult Follow Up  Reason for Consult:  Capacity evaluation Referring Physician:  Dr. Sherral Hammers Patient Identification: Wendy Cole MRN:  [id number] Principal Diagnosis: <principal problem not specified> Diagnosis:   Patient Active Problem List   Diagnosis Date Noted  . Absolute anemia [D64.9]   . Pulmonary edema [J81.1]   . Abdominal pain [R10.9]   . Altered mental state [R41.82]   . Edema of both legs [R60.0]   . Psychotic episode [F29]   . Urinary tract infection, site not specified [N39.0]   . Controlled diabetes mellitus type 2 with complications ([place]) [M54.6]   . Chronic kidney disease, stage III (moderate) [N18.3]   . Supratherapeutic INR [R79.1] 10/05/2015  . Lower GI bleed [K92.2] 10/05/2015  . Diabetes mellitus type 2, uncontrolled, with complications ([place]) [T03.5, E11.65] 10/05/2015  . Anxiety [F41.9] 10/05/2015  . Depression [F32.9] 10/05/2015  . CAD in native artery [I25.10] 10/05/2015  . Paroxysmal atrial fibrillation ([place]) [I48.0] 10/05/2015  . Chronic diastolic CHF (congestive heart failure) ([place]) [I50.32] 10/05/2015  . Cardiomyopathy ([place]) [I42.9] 10/05/2015  . HLD (hyperlipidemia) [E78.5] 10/05/2015  . CHF, acute on chronic ([place]) [I50.9] 10/05/2015  . Bleeding gastrointestinal [K92.2]   . Systolic CHF, acute on chronic ([place]) [I50.23]   . Syncope and collapse [R55]   . Syncope [R55] 07/23/2015  . Family history of aneurysm [Z84.89] 07/05/2015  . Thoracic back pain [M54.6] 01/21/2015  . Edema [R60.9] 09/21/2014  . Cellulitis of foot, left [L03.116] 08/14/2014  . Acute sinusitis [J01.90] 06/22/2014  . Murmur [R01.1] 02/20/2014  . Hyperglycemia [R73.9] 08/11/2013  . Encounter for therapeutic drug monitoring [Z51.81] 06/21/2013  . Leg pain [M79.606] 05/29/2013  . Swelling of limb-Left foot [M79.89] 05/29/2013  . Occlusion and stenosis of carotid artery without mention of cerebral infarction [I65.29] 05/20/2012  . Chronic venous  embolism and thrombosis of deep vessels of lower extremity ([place]) [I82.509] 05/20/2012  . LBP (low back pain) [M54.5] 04/13/2012  . Flash pulmonary edema ([place]) [J81.0] 02/03/2012  . SIRS (systemic inflammatory response syndrome) ([place]) [R65.10] 02/03/2012  . Acute respiratory failure with hypoxia ([place]) [J96.01] 02/03/2012  . Pyelonephritis [N12] 02/02/2012  . Fever [R50.9] 02/02/2012  . Iritis [H20.9]   . Chronic anticoagulation [Z79.01]   . Obesity [E66.9]   . Cancer ([place]) [C80.1]   . HOH (hard of hearing) [H91.90]   . GERD (gastroesophageal reflux disease) [K21.9]   . Hypothyroidism [E03.9]   . Aftercare following surgery of the circulatory system, [place] [Z48.812] 01/29/2012  . Hypertension associated with diabetes ([place]) [E11.59, I10] 12/15/2011  . Carotid stenosis, bilateral [I65.23] 11/13/2011  . Chronic systolic CHF (congestive heart failure), NYHA class 3- EF 25-30% [I50.22] 09/15/2011  . Nonischemic cardiomyopathy ([place]) [I42.9] 08/07/2011  . SOB (shortness of breath) [R06.02] 07/13/2011  . PAF (paroxysmal atrial fibrillation) ([place]) [I48.0] 07/13/2011  . Leukocytosis [D72.829] 07/13/2011  . OSA on CPAP [G47.33]   . Anemia [D64.9]   . CKD (chronic kidney disease) stage 3, GFR 30-59 ml/min [N18.3]   . Gout [M10.9]   . Stroke [facility]) [I63.9]   . Headache [R51] 11/27/2009  . History of DVT (deep vein thrombosis) [Z86.718] 01/31/2009  . LIVER FUNCTION TESTS, ABNORMAL, HX OF [Z86.39, Z86.2] 12/11/2008  . Essential hypertension [I10] 01/17/2008  . APHTHOUS STOMATITIS [K12.0] 01/17/2008  . Lower extremity edema [R60.0] 10/11/2007  . Dyspnea on exertion [R06.09] 08/25/2007  . Chronic kidney disease (CKD), stage IV (severe) ([place]) [N18.4] 07/05/2007  . BUNION [M21.619] 04/28/2007  . Diabetes type 2, controlled ([place]) [  E11.9] 03/22/2007  . Dyslipidemia [E78.5] 03/22/2007  . Unspecified Anemia [D64.9] 03/22/2007  . OSTEOARTHRITIS [M19.90] 03/22/2007  . Polymyalgia rheumatica- off steroids  since 2012 [M35.3] 03/22/2007    Total Time spent with patient: 30 minutes  Subjective:   Wendy Cole is a 80 y.o. female patient admitted with SOB and leg swelling.  HPI:  Wendy Cole is a 80 years old female, seen, chart reviewed for psychiatric consultation and evaluation of capacity to make her own medical decisions. Patient is suffering with multiple medical problems including COPD, obstructive sleep apnea and breathing support. Patient could not communicate during this evaluation and most of the information obtained from her husband who is at bed side. Patient husband reported she presented with shortness of breath and leg swelling secondary to recent medication change in her steroids - prednisone. Patient husband requested written indication for all of her medication at the time of discharge. Patient reported he cannot provide any physical services to her because of he has only 1 leg and his wife knows all her medications and takes as prescribed. Reportedly she has no problems with noncompliance as far patient has been. Past Psychiatric History: Patient has a history of depression but no acute psychiatric hospitalization reported.  Interval history: Patient seen today for psychiatric consultation follow-up as she is able to show some clinical improvement. Patient is having oxygen mask, able to speak verbally with the short sentences. Patient husband is at bedside. Patient reportedly compliant with her medication and has no reported adverse affects. Patient currently denies symptoms of depression, anxiety, auditory/visual hallucinations, delusions and paranoia. Patient has been oriented to herself herself with first name, last name and able to name her husband's name and her son's name. She also has orientation of being in hospital [facility]. Patient reported she has been suffering with multiple medical problems and does not like to see doctors that much. Patient has no  safety concerns and denies active suicidal/homicidal ideation, intention or plans.  Risk to Self: Is patient at risk for suicide?: No Risk to Others:   Prior Inpatient Therapy:   Prior Outpatient Therapy:    Past Medical History:  Past Medical History  Diagnosis Date  . Anemia   . Anxiety states   . Depressive disorder, not elsewhere classified   . Type II or unspecified type diabetes mellitus without mention of complication, not stated as uncontrolled   . Hyperlipidemia   . Unspecified essential hypertension   . Renal insufficiency   . Osteoarthritis   . Gout   . Iritis   . Nonischemic cardiomyopathy (HCC)     EF 20 to 25% per echo 06/2011  . Chronic anticoagulation   . NSTEMI (non-ST elevated myocardial infarction) [facility]) Feb 2013    06/2011 cath  Mild nonobstructive disease. No LV gram  . PAF (paroxysmal atrial fibrillation) ([place])   . Obesity   . Cancer [facility]) 2010    Right breast  . DVT (deep venous thrombosis) ([place]) 2010  . Stroke [facility]) 2004    Left upper lobe  . Thyroid disease 1969    Three fourth of Thyroid removed  . Complication of anesthesia     "fights it", for colonoscopy  . HOH (hard of hearing)     deaf in L completely  . OSA on CPAP     CPAP at night  . GERD (gastroesophageal reflux disease)   . Hypothyroidism   . Peripheral vascular disease ([place]) Jan. 2015    Past  Surgical History  Procedure Laterality Date  . Breast lumpectomy    . Cataract extraction    . Foot surgery    . Tonsillectomy  1949  . Abdominal hysterectomy  1974  . Eye surgery  2008    Glaucoma shunt Right eye  . Parotid gland tumor excision  2000  . Cystectomy  1974    Left Breast, chin, left of groin area  . Thyroidectomy, partial  1969  . Endarterectomy  11/18/2011    rocedure: ENDARTERECTOMY CAROTID;  Surgeon: Conrad Gifford, MD;  Location: [place];  Service: Vascular;  Laterality: Left;  [name] Mastectomy  right  . Mastectomy    . Cardiac catheterization      no stent  . Colonoscopy  w/ polypectomy    . Endarterectomy  01/12/2012    Procedure: ENDARTERECTOMY CAROTID;  Surgeon: Conrad Campbell, MD;  Location[facility] OR;  Service: Vascular;  Laterality: Right;  Right carotid endarterectomy with 1cm x 6cm vascuguard patch angioplasty.  . Carotid endarterectomy Left 11-18-11    cea  . Carotid endarterectomy Right 01-12-12    cea  . Left and right heart catheterization with coronary/graft angiogram Left 07/20/2011    Procedure: LEFT AND RIGHT HEART CATHETERIZATION WITH Beatrix Fetters;  Surgeon: Peter M Martinique, MD;  Location: [facility] CATH LAB;  Service: Cardiovascular;  Laterality: Left;   Family History:  Family History  Problem Relation Age of Onset  . Hypertension    . Congestive Heart Failure Mother 82  . Breast cancer Mother   . Heart attack Mother   . Heart disease Father 35  . Heart disease Brother   . Coronary artery disease Son 40    2 Stents  . Heart attack Father   . AAA (abdominal aortic aneurysm) Brother    Family Psychiatric  History: Unknown Social History:  History  Alcohol Use No     History  Drug Use No    Social History   Social History  . Marital Status: Married    Spouse Name: N/A  . Number of Children: 3  . Years of Education: N/A   Occupational History  . Retired    Social History Main Topics  . Smoking status: Former Smoker -- 2.00 packs/day for 40 years    Types: Cigarettes    Quit date: 05/25/2002  . Smokeless tobacco: Never Used  . Alcohol Use: No  . Drug Use: No  . Sexual Activity: Not Currently   Other Topics Concern  . None   Social History Narrative   Lives with husband and youngest son.     Additional Social History:    Allergies:   Allergies  Allergen Reactions  . Amlodipine     edema  . Atorvastatin Other (See Comments)    Muscle aches  . Colesevelam Other (See Comments)    unknown  . Coumadin [Warfarin Sodium]     "causes cramps" reaction to brand name only per pt  . Lasix [Furosemide] Other (See  Comments)    Muscle cramps  . Statins Other (See Comments)    Muscle aches  . Tape Rash    Labs:  Results for orders placed or performed during the hospital encounter of 10/05/15 (from the past 48 hour(s))  Protime-INR     Status: Abnormal   Collection Time: 10/13/15  4:43 AM  Result Value Ref Range   Prothrombin Time 15.9 (H) 11.6 - 15.2 seconds   INR 1.26 0.00 - 4.49  Basic metabolic panel  Status: Abnormal   Collection Time: 10/13/15  4:43 AM  Result Value Ref Range   Sodium 143 135 - 145 mmol/L   Potassium 4.0 3.5 - 5.1 mmol/L   Chloride 93 (L) 101 - 111 mmol/L   CO2 32 22 - 32 mmol/L   Glucose, Bld 102 (H) 65 - 99 mg/dL   BUN 53 (H) 6 - 20 mg/dL   Creatinine, Ser 2.57 (H) 0.44 - 1.00 mg/dL   Calcium 9.2 8.9 - 10.3 mg/dL   GFR calc non Af Amer 17 (L) >60 mL/min   GFR calc Af Amer 20 (L) >60 mL/min    Comment: (NOTE) The eGFR has been calculated using the CKD EPI equation. This calculation has not been validated in all clinical situations. eGFR's persistently <60 mL/min signify possible Chronic Kidney Disease.    Anion gap 18 (H) 5 - 15  Magnesium     Status: None   Collection Time: 10/13/15  4:43 AM  Result Value Ref Range   Magnesium 2.0 1.7 - 2.4 mg/dL  CBC with Differential/Platelet     Status: Abnormal   Collection Time: 10/13/15  4:43 AM  Result Value Ref Range   WBC 12.5 (H) 4.0 - 10.5 K/uL   RBC 4.86 3.87 - 5.11 MIL/uL   Hemoglobin 10.4 (L) 12.0 - 15.0 g/dL   HCT 37.8 36.0 - 46.0 %   MCV 77.8 (L) 78.0 - 100.0 fL   MCH 21.4 (L) 26.0 - 34.0 pg   MCHC 27.5 (L) 30.0 - 36.0 g/dL   RDW 23.2 (H) 11.5 - 15.5 %   Platelets 284 150 - 400 K/uL   Neutrophils Relative % 84 %   Lymphocytes Relative 6 %   Monocytes Relative 6 %   Eosinophils Relative 3 %   Basophils Relative 1 %   Neutro Abs 10.4 (H) 1.7 - 7.7 K/uL   Lymphs Abs 0.8 0.7 - 4.0 K/uL   Monocytes Absolute 0.8 0.1 - 1.0 K/uL   Eosinophils Absolute 0.4 0.0 - 0.7 K/uL   Basophils Absolute 0.1 0.0 -  0.1 K/uL   RBC Morphology POLYCHROMASIA PRESENT     Comment: ELLIPTOCYTES TARGET CELLS    WBC Morphology ATYPICAL LYMPHOCYTES   Blood gas, arterial     Status: Abnormal   Collection Time: 10/13/15  8:30 AM  Result Value Ref Range   FIO2 0.45    Delivery systems OXYGEN MASK    pH, Arterial 7.466 (H) 7.350 - 7.450   pCO2 arterial 43.1 35.0 - 45.0 mmHg   pO2, Arterial 95.7 80.0 - 100.0 mmHg   Bicarbonate 30.7 (H) 20.0 - 24.0 mEq/L   TCO2 32.0 0 - 100 mmol/L   Acid-Base Excess 6.8 (H) 0.0 - 2.0 mmol/L   O2 Saturation 97.6 %   Patient temperature 98.6    Collection site RIGHT RADIAL    Drawn by [id number]    Sample type ARTERIAL DRAW    Allens test (pass/fail) PASS PASS  Culture, blood (routine x 2)     Status: None (Preliminary result)   Collection Time: 10/13/15  9:08 AM  Result Value Ref Range   Specimen Description BLOOD LEFT ARM    Special Requests BOTTLES DRAWN AEROBIC ONLY 5CC    Culture NO GROWTH 1 DAY    Report Status PENDING   Culture, blood (routine x 2)     Status: None (Preliminary result)   Collection Time: 10/13/15  9:15 AM  Result Value Ref Range   Specimen Description BLOOD RIGHT  HAND    Special Requests BOTTLES DRAWN AEROBIC ONLY 10CC    Culture NO GROWTH 1 DAY    Report Status PENDING   Protime-INR     Status: Abnormal   Collection Time: 10/14/15  5:40 AM  Result Value Ref Range   Prothrombin Time 17.2 (H) 11.6 - 15.2 seconds   INR 1.40 0.00 - 1.30  Basic metabolic panel     Status: Abnormal   Collection Time: 10/14/15  5:40 AM  Result Value Ref Range   Sodium 145 135 - 145 mmol/L   Potassium 3.7 3.5 - 5.1 mmol/L   Chloride 99 (L) 101 - 111 mmol/L   CO2 31 22 - 32 mmol/L   Glucose, Bld 97 65 - 99 mg/dL   BUN 65 (H) 6 - 20 mg/dL   Creatinine, Ser 2.75 (H) 0.44 - 1.00 mg/dL   Calcium 8.9 8.9 - 10.3 mg/dL   GFR calc non Af Amer 16 (L) >60 mL/min   GFR calc Af Amer 18 (L) >60 mL/min    Comment: (NOTE) The eGFR has been calculated using the CKD EPI  equation. This calculation has not been validated in all clinical situations. eGFR's persistently <60 mL/min signify possible Chronic Kidney Disease.    Anion gap 15 5 - 15  Magnesium     Status: None   Collection Time: 10/14/15  5:40 AM  Result Value Ref Range   Magnesium 2.0 1.7 - 2.4 mg/dL    Current Facility-Administered Medications  Medication Dose Route Frequency Provider Last Rate Last Dose  . 0.45 % sodium chloride infusion   Intravenous Continuous Cherene Altes, MD 10 mL/hr at 10/14/15 1032    . acetaminophen (TYLENOL) suppository 650 mg  650 mg Rectal Q4H PRN Cherene Altes, MD   650 mg at 10/14/15 0053  . acetaminophen (TYLENOL) tablet 650 mg  650 mg Oral Q4H PRN Cherene Altes, MD   650 mg at 10/06/15 1650  . albuterol (PROVENTIL) (2.5 MG/3ML) 0.083% nebulizer solution 2.5 mg  2.5 mg Nebulization Q2H PRN Cherene Altes, MD   2.5 mg at 10/06/15 1733  . allopurinol (ZYLOPRIM) tablet 100 mg  100 mg Oral Daily Allie Bossier, MD   100 mg at 10/11/15 0939  . antiseptic oral rinse (CPC / CETYLPYRIDINIUM CHLORIDE 0.05%) solution 7 mL  7 mL Mouth Rinse q12n4p Cherene Altes, MD   7 mL at 10/13/15 1600  . aspirin EC tablet 81 mg  81 mg Oral Daily Allie Bossier, MD   81 mg at 10/11/15 0936  . brimonidine (ALPHAGAN) 0.2 % ophthalmic solution 1 drop  1 drop Both Eyes BID Cherene Altes, MD   1 drop at 10/13/15 2112  . bumetanide (BUMEX) injection 2 mg  2 mg Intravenous Daily Allie Bossier, MD   2 mg at 10/14/15 1027  . buPROPion (WELLBUTRIN SR) 12 hr tablet 150 mg  150 mg Oral Daily Allie Bossier, MD   150 mg at 10/11/15 0942  . chlorhexidine (PERIDEX) 0.12 % solution 15 mL  15 mL Mouth Rinse BID Cherene Altes, MD   15 mL at 10/13/15 2112  . cholecalciferol (VITAMIN D) tablet 1,000 Units  1,000 Units Oral BID Allie Bossier, MD   1,000 Units at 10/11/15 8657  . hydrALAZINE (APRESOLINE) injection 5 mg  5 mg Intravenous Q4H Allie Bossier, MD   5 mg at 10/14/15 1024   . isosorbide mononitrate (IMDUR) 24 hr tablet 60 mg  60 mg Oral Daily Allie Bossier, MD   60 mg at 10/11/15 0936  . levothyroxine (SYNTHROID, LEVOTHROID) injection 62.5 mcg  62.5 mcg Intravenous Daily Allie Bossier, MD   62.5 mcg at 10/13/15 1028  . metoprolol (LOPRESSOR) injection 7.5 mg  7.5 mg Intravenous Q4H Allie Bossier, MD   7.5 mg at 10/14/15 0513  . mometasone-formoterol (DULERA) 100-5 MCG/ACT inhaler 2 puff  2 puff Inhalation BID Allie Bossier, MD   2 puff at 10/12/15 2128  . ondansetron (ZOFRAN) injection 4 mg  4 mg Intravenous Q6H PRN Allie Bossier, MD   4 mg at 10/06/15 0655  . pantoprazole (PROTONIX) EC tablet 40 mg  40 mg Oral BID Cherene Altes, MD   40 mg at 10/11/15 0939  . polyethylene glycol (MIRALAX / GLYCOLAX) packet 17 g  17 g Oral Daily Dianne Dun, NP   17 g at 10/11/15 0941  . prednisoLONE acetate (PRED FORTE) 1 % ophthalmic suspension 1 drop  1 drop Both Eyes Daily PRN Allie Bossier, MD   1 drop at 10/13/15 1032  . risperiDONE (RISPERDAL) tablet 0.25 mg  0.25 mg Oral BID Allie Bossier, MD   0.25 mg at 10/12/15 2200  . sodium chloride flush (NS) 0.9 % injection 10-40 mL  10-40 mL Intracatheter PRN Allie Bossier, MD   20 mL at 10/12/15 2044  . timolol (TIMOPTIC) 0.5 % ophthalmic solution 1 drop  1 drop Both Eyes BID Allie Bossier, MD   1 drop at 10/13/15 2111    Musculoskeletal: Strength & Muscle Tone: decreased Gait & Station: unable to stand Patient leans: N/A  Psychiatric Specialty Exam: Review of Systems  Unable to perform ROS    Blood pressure 191/74, pulse 76, temperature 98.3 F (36.8 C), temperature source Axillary, resp. rate 21, height _0  (1.676 m), weight 81.9 kg (180 lb 8.9 oz), SpO2 100 %.Body mass index is 29.16 kg/(m^2).  General Appearance: Casual  Eye Contact::  Good  Speech:  Clear and Coherent and Slow  Volume:  Decreased  Mood:  Anxious and Depressed  Affect:  Appropriate and Congruent  Thought Process:  Coherent  and Goal Directed  Orientation:  Full (Time, Place, and Person)  Thought Content:  Logical  Suicidal Thoughts:  No  Homicidal Thoughts:  No  Memory:  Immediate;   Fair Recent;   Poor  Judgement:  Fair  Insight:  Shallow  Psychomotor Activity:  Decreased  Concentration:  Fair  Recall:  [employer] of Knowledge:Fair  Language: Good  Akathisia:  NA  Handed:  Right  AIMS (if indicated):     Assets:  Communication Skills Desire for Improvement Financial Resources/Insurance Housing Intimacy Leisure Time Resilience Social Support Transportation  ADL's:  Impaired  Cognition: Impaired,  Severe  Sleep:      Treatment Plan Summary:  Patient has been slowly improving her mental status and regaining her cognitive functions since admitted to the hospital.  Patient has intact cognitions and meets criteria for capacity make her own medical decisions Recommended no acute psych hospitalization and no psychotropic medication at this time Appreciate psychiatric consultation and we sign off as of today Please contact [phone] or [phone] if needs further assistance  Disposition: Patient does not meet criteria for psychiatric inpatient admission. Supportive therapy provided about ongoing stressors.  Durward Parcel., MD 10/14/2015 12:26 PM

## 2015-10-15 DIAGNOSIS — R4182 Altered mental status, unspecified: Secondary | ICD-10-CM | POA: Diagnosis present

## 2015-10-15 LAB — COMPREHENSIVE METABOLIC PANEL
ALT: 16 U/L (ref 14–54)
AST: 26 U/L (ref 15–41)
Albumin: 2.5 g/dL — ABNORMAL LOW (ref 3.5–5.0)
Alkaline Phosphatase: 88 U/L (ref 38–126)
Anion gap: 13 (ref 5–15)
BILIRUBIN TOTAL: 2.9 mg/dL — AB (ref 0.3–1.2)
BUN: 64 mg/dL — AB (ref 6–20)
CHLORIDE: 96 mmol/L — AB (ref 101–111)
CO2: 32 mmol/L (ref 22–32)
CREATININE: 2.35 mg/dL — AB (ref 0.44–1.00)
Calcium: 8.7 mg/dL — ABNORMAL LOW (ref 8.9–10.3)
GFR, EST AFRICAN AMERICAN: 22 mL/min — AB (ref >60)
GFR, EST NON AFRICAN AMERICAN: 19 mL/min — AB (ref >60)
Glucose, Bld: 112 mg/dL — ABNORMAL HIGH (ref 65–99)
Potassium: 3.5 mmol/L (ref 3.5–5.1)
Sodium: 141 mmol/L (ref 135–145)
TOTAL PROTEIN: 5.7 g/dL — AB (ref 6.5–8.1)

## 2015-10-15 LAB — BLOOD CULTURE ID PANEL (REFLEXED)
ACINETOBACTER BAUMANNII: NOT DETECTED
CANDIDA ALBICANS: NOT DETECTED
CANDIDA GLABRATA: NOT DETECTED
Candida krusei: NOT DETECTED
Candida parapsilosis: NOT DETECTED
Candida tropicalis: NOT DETECTED
Carbapenem resistance: NOT DETECTED
ENTEROBACTER CLOACAE COMPLEX: NOT DETECTED
ENTEROCOCCUS SPECIES: NOT DETECTED
ESCHERICHIA COLI: NOT DETECTED
Enterobacteriaceae species: NOT DETECTED
HAEMOPHILUS INFLUENZAE: NOT DETECTED
Klebsiella oxytoca: NOT DETECTED
Klebsiella pneumoniae: NOT DETECTED
LISTERIA MONOCYTOGENES: NOT DETECTED
METHICILLIN RESISTANCE: DETECTED — AB
NEISSERIA MENINGITIDIS: NOT DETECTED
Proteus species: NOT DETECTED
Pseudomonas aeruginosa: NOT DETECTED
SERRATIA MARCESCENS: NOT DETECTED
STAPHYLOCOCCUS AUREUS BCID: NOT DETECTED
STREPTOCOCCUS PYOGENES: NOT DETECTED
STREPTOCOCCUS SPECIES: NOT DETECTED
Staphylococcus species: DETECTED — AB
Streptococcus agalactiae: NOT DETECTED
Streptococcus pneumoniae: NOT DETECTED
VANCOMYCIN RESISTANCE: NOT DETECTED

## 2015-10-15 LAB — CBC
HCT: 34.2 % — ABNORMAL LOW (ref 36.0–46.0)
Hemoglobin: 9.3 g/dL — ABNORMAL LOW (ref 12.0–15.0)
MCH: 21.4 pg — ABNORMAL LOW (ref 26.0–34.0)
MCHC: 27.2 g/dL — ABNORMAL LOW (ref 30.0–36.0)
MCV: 78.6 fL (ref 78.0–100.0)
PLATELETS: 297 10*3/uL (ref 150–400)
RBC: 4.35 MIL/uL (ref 3.87–5.11)
RDW: 23.8 % — AB (ref 11.5–15.5)
WBC: 11 10*3/uL — AB (ref 4.0–10.5)

## 2015-10-15 MED ORDER — FLUCONAZOLE IN SODIUM CHLORIDE 200-0.9 MG/100ML-% IV SOLN
200.0000 mg | INTRAVENOUS | Status: DC
  Administered 2015-10-15 – 2015-10-16 (×2): 200 mg via INTRAVENOUS
  Filled 2015-10-15 (×3): qty 100

## 2015-10-15 NOTE — Progress Notes (Signed)
10/15/2015 Patient was place back to bed at 1545. Patient had a bowel movement, she was placed on the bed pan but did not void. Bladder scan was done she had 130cc  In bladder. Dr Sherral Hammers was made aware or was given for In and out cath as needed. [facility] RN.

## 2015-10-15 NOTE — Progress Notes (Signed)
Occupational Therapy Treatment Patient Details Name: Wendy Cole MRN: [id number] DOB: May 22, 1936 Today's Date: 10/15/2015    History of present illness This 80 y.o. female was brought to ED when she was unable to get up from the toilet.  In ED pt was grossly volume overloaded and had Guiac + stool.  Hgb 5.8 and INR 5.8.  Dx: UTI, diastolic HF exacerbation, GI bleed, OSA, paroxysmal A-Fib, toxic metabolic encephalopathy w/ increasing confusion likely UTI contributing along w/ steroid induced delirium.[name]  PMH includes:  Anxiety/depression, A-Fib, chronic severe diastolic CHF, HTN, stage III CKD, h/o DVT, OSA, Breast CA, s/p Rt mastectomy.    OT comments  Pt with lethargy and minimal participation in mobility and eating. Indicating R shoulder pain. Pt requiring +2 total assist for OOB to chair. Son present for session. RN made aware that pt was clearing her throat and coughing when fed. Instructed son to not feed pt until swallowing is assessed due to risk of aspiration. SNF continues to be the appropriate discharge plan for further rehab.  Follow Up Recommendations  SNF;Supervision/Assistance - 24 hour    Equipment Recommendations  3 in 1 bedside comode;Hospital bed    Recommendations for Other Services      Precautions / Restrictions Precautions Precautions: Fall Precaution Comments: monitor O2 Restrictions Weight Bearing Restrictions: No       Mobility Bed Mobility Overal bed mobility: Needs Assistance;+2 for physical assistance;+ 2 for safety/equipment Bed Mobility: Supine to Sit     Supine to sit: +2 for physical assistance;HOB elevated;Total assist     General bed mobility comments: Assist to elevate trunk and managing Bil LEs.  With cues pt reaches out for support.  Use of bed pad for positioning.  Transfers Overall transfer level: Needs assistance Equipment used: 2 person hand held assist Transfers: Squat Pivot Transfers Sit to Stand: +2 physical assistance;+2  safety/equipment;Total assist   Squat pivot transfers: Total assist;+2 physical assistance;+2 safety/equipment     General transfer comment: Use of bed pad and gait belt in place to boost pt to squatting position.  Other than holding onto therapist w/ Lt UE, pt not assisting and requires total assist to pivot and direct hips to sit in recliner chair.    Balance Overall balance assessment: Needs assistance Sitting-balance support: Feet supported Sitting balance-Leahy Scale: Poor Sitting balance - Comments: Requires min>max assist sitting EOB due to Rt lateral lean sitting EOB ~8 minutes. Postural control: Right lateral lean Standing balance support: During functional activity;Single extremity supported Standing balance-Leahy Scale: Zero Standing balance comment: minimal weight bearing                   ADL Overall ADL's : Needs assistance/impaired Eating/Feeding: Sitting;Total assistance Eating/Feeding Details (indicate cue type and reason): noted throat clearing with jello and juice, RN notified, asked son not to feed her for now                                   General ADL Comments: Pt with lethargy throughout session, became somewhat more alert at end of session and answering yes/no questions appropriately.      Vision                     Perception     Praxis      Cognition   Behavior During Therapy: Flat affect Overall Cognitive Status: Impaired/Different from baseline Area of Impairment: Orientation;Attention;Memory;Safety/judgement;Problem  solving;Following commands;Awareness Orientation Level: Disoriented to;Situation;Time;Place;Person Current Attention Level: Focused Memory: Decreased short-term memory  Following Commands: Follows one step commands inconsistently;Follows one step commands with increased time Safety/Judgement: Decreased awareness of safety;Decreased awareness of deficits Awareness: Intellectual Problem Solving:  Difficulty sequencing;Requires verbal cues;Slow processing;Decreased initiation;Requires tactile cues General Comments: Pt minimally verbal but as session progressed she began answering more yes/no questions.  Keeps eyes closed until sitting EOB    Extremity/Trunk Assessment               Exercises     Shoulder Instructions       General Comments      Pertinent Vitals/ Pain       Pain Assessment: Faces Faces Pain Scale: Hurts even more Pain Location: R shoulder Pain Descriptors / Indicators: Guarding;Grimacing;Moaning Pain Intervention(s): Limited activity within patient's tolerance;Monitored during session;Repositioned  Home Living                                          Prior Functioning/Environment              Frequency Min 2X/week     Progress Toward Goals  OT Goals(current goals can now be found in the care plan section)  Progress towards OT goals: Not progressing toward goals - comment (lethargic)  Acute Rehab OT Goals Patient Stated Goal: none stated  Plan Discharge plan remains appropriate    Co-evaluation    PT/OT/SLP Co-Evaluation/Treatment: Yes Reason for Co-Treatment: For patient/therapist safety;Complexity of the patient's impairments (multi-system involvement);Necessary to address cognition/behavior during functional activity PT goals addressed during session: Mobility/safety with mobility;Balance;Strengthening/ROM OT goals addressed during session: ADL's and self-care      End of Session Equipment Utilized During Treatment: Oxygen;Rolling walker (ventimask)   Activity Tolerance Patient limited by lethargy   Patient Left in chair;with call bell/phone within reach;with chair alarm set;with family/visitor present   Nurse Communication Other (comment);Mobility status (clearing throat with liquids)        Time: [id number] OT Time Calculation (min): 28 min  Charges: OT General Charges $OT Visit: 1 Procedure OT  Treatments $Therapeutic Activity: 8-22 mins  Malka So 10/15/2015, 11:25 AM  [phone]

## 2015-10-15 NOTE — Progress Notes (Signed)
PHARMACY - PHYSICIAN COMMUNICATION CRITICAL VALUE ALERT - BLOOD CULTURE IDENTIFICATION (BCID)  Results for orders placed or performed during the hospital encounter of 10/05/15  Blood Culture ID Panel (Reflexed) (Collected: 10/13/2015  9:15 AM)  Result Value Ref Range   Enterococcus species NOT DETECTED NOT DETECTED   Vancomycin resistance NOT DETECTED NOT DETECTED   Listeria monocytogenes NOT DETECTED NOT DETECTED   Staphylococcus species DETECTED (A) NOT DETECTED   Staphylococcus aureus NOT DETECTED NOT DETECTED   Methicillin resistance DETECTED (A) NOT DETECTED   Streptococcus species NOT DETECTED NOT DETECTED   Streptococcus agalactiae NOT DETECTED NOT DETECTED   Streptococcus pneumoniae NOT DETECTED NOT DETECTED   Streptococcus pyogenes NOT DETECTED NOT DETECTED   Acinetobacter baumannii NOT DETECTED NOT DETECTED   Enterobacteriaceae species NOT DETECTED NOT DETECTED   Enterobacter cloacae complex NOT DETECTED NOT DETECTED   Escherichia coli NOT DETECTED NOT DETECTED   Klebsiella oxytoca NOT DETECTED NOT DETECTED   Klebsiella pneumoniae NOT DETECTED NOT DETECTED   Proteus species NOT DETECTED NOT DETECTED   Serratia marcescens NOT DETECTED NOT DETECTED   Carbapenem resistance NOT DETECTED NOT DETECTED   Haemophilus influenzae NOT DETECTED NOT DETECTED   Neisseria meningitidis NOT DETECTED NOT DETECTED   Pseudomonas aeruginosa NOT DETECTED NOT DETECTED   Candida albicans NOT DETECTED NOT DETECTED   Candida glabrata NOT DETECTED NOT DETECTED   Candida krusei NOT DETECTED NOT DETECTED   Candida parapsilosis NOT DETECTED NOT DETECTED   Candida tropicalis NOT DETECTED NOT DETECTED    Name of physician (or Provider) Contacted: Forrest Moron then Dia Crawford via text page (no response)  Changes to prescribed antibiotics required: May be contaminate, will f/u if infectious source suspected to start abx.  Wynona Neat, PharmD, BCPS  10/15/2015  7:26 AM

## 2015-10-15 NOTE — Progress Notes (Signed)
PROGRESS NOTE    Wendy Cole  [id number] DOB: 03/03/1936 DOA: 10/05/2015 PCP: Walker Kehr, MD   Brief Narrative:  80 y.o. WF PMHx Anxiety, Depression,CVA, CAD native artery, Paroxysmal Atrial Fibrillation on Warfarin Rx, Chronic Diastolic CHF/Cardiomyopathy, NSTEMI, HTN, Stage III CKD, Hypothyroid, OSA and hx of Breast Cancer S/P Rt. Mastectomy HLD, DVT,   Who presents to the ED , presents to the Emergency Department today complaining of BLE swelling and shortness of breath. Noted increasing weakness since DC from hospital on 07-30-15 due to syncopal episode. Seen Neurology on 09-05-15 for follow up with no neurologic cause. Pt notes inability to ambulate due to weakness and called EMS as she was unable to get off of the toilet. Started using wheelchair in the past week due to weakness. Pt also notes right shoulder pain s/p mechanical fall earlier this week with no head trauma. Notes pain is 5/10 and aching. Pt has Chest tightness with SOB. No CP. No N/V/D. No headaches. No fevers. No hematochezia. No black tarry stools. No other symptoms noted.  Occult blood positive In ED. Patient has not noticed blood in urine or Stool.states allergic to Lasix will cause her extreme muscle spasms ~ 30-45 minutes post administration..states baseline weight is ~218 pounds(99 kg). States does not weigh herself daily   Assessment & Plan:   Principal Problem:   Absolute anemia Active Problems:   Essential hypertension   History of DVT (deep vein thrombosis)   OSA on CPAP   Anemia   CKD (chronic kidney disease) stage 3, GFR 30-59 ml/min   Stroke (HCC)   Chronic anticoagulation   Hypothyroidism   Chronic venous embolism and thrombosis of deep vessels of lower extremity (HCC)   Supratherapeutic INR   Lower GI bleed   Diabetes mellitus type 2, uncontrolled, with complications (HCC)   Anxiety   Depression   CAD in native artery   Paroxysmal atrial fibrillation (HCC)   Chronic diastolic CHF  (congestive heart failure) (HCC)   Cardiomyopathy (HCC)   HLD (hyperlipidemia)   CHF, acute on chronic (HCC)   Bleeding gastrointestinal   Urinary tract infection, site not specified   Controlled diabetes mellitus type 2 with complications (HCC)   Chronic kidney disease, stage III (moderate)   Abdominal pain   Altered mental state   Edema of both legs   Psychotic episode   Pulmonary edema  Staph Agalactiae UTI  -Completed 5 day course Rocephin   Coag negative staph positive -1/2 blood cultures positive most likely contaminant  Funguria -5/21 urine positive yeast, normally do not treat as an infection however given patient's continued AMS believe would be prudent to treat. -Diflucan IV 200 mg daily 2 weeks -Discontinue Foley  Lower GI bleed -On admissions patient's hemoglobin 5.8, positive occult blood -5/13 transfuse 3 units PRBC -GI consulted; secondary to patient's poorly controlled respiratory/cardiac status GI deferred colonoscopy or EGD at this time. Will follow from a distance. -Stable hemoglobin most likely secondary to supratherapeutic INR  Supratherapeutic INR -INR = 6.7 at admission; - 5/17 WNL Lab 10/08/15 0544 10/09/15 0433 10/10/15 0905 10/13/15 0443  HGB 8.1* 8.3* 8.3* 10.4*    Chronic diastolic CHF/Cardiomyopathy -Echocardiogram; confirms dilated cardiomyopathy, diastolic CHF see echocardiogram below -Fluid overloaded (edematous to hips) but significantly improved from admission. -strict in and out since admission -24.7 L -Daily weight Filed Weights   10/13/15 0123 10/14/15 0500 10/15/15 0356  Weight: 87.7 kg (193 lb 5.5 oz) 81.9 kg (180 lb 8.9 oz) 82 kg (180 lb  12.4 oz)  -Metoprolol IV 7.5mg  q 4hr; restart Coreg 12.5 mg BID when patient's mental status improves. -Hydralazine IV 5 mg q 4hr; restart Hydralazine 50 mg TID when patient's bowel status improves -Imdur 60 mg daily -Out of bed to chair q shift  Paroxysmal Atrial fibrillation -A. fib,  rate controlled; has been in NSR most of the time.  -See cardiomyopathy -Coumadin on hold, will discuss with family if we should restart Coumadin given patient's wish to consume food as she likes.  OSA -5/21 overnight BIPAP on 16/3 With O2 bleed in  DM type 2 Controlled with complications; Diabetic neuropathy -5/13 A1c= <4.3 -Neurontin 200 mg TID (home dose Neurontin 400 mg BID), however patient just laid on couch per son. Titrate to effect -5/21 continued AMS/somnolence. DC Neurontin  HLD -Lipid panel within ADA guidelines   Acute on CKD stage III (Baseline~1.82-2.12) Lab Results  Component Value Date   CREATININE 2.35* 10/15/2015   CREATININE 2.75* 10/14/2015   CREATININE 2.57* 10/13/2015   Hypothyroidism -Synthroid IV 62.5 g; restart Synthroid 137 g when patient's cognition improved. -TSH; 1.89  DVT -Patient had been on Coumadin unfortunately with GI bleed will hold  AMS/Steroid-induced Psychosis? 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 Since patient on minimal steroids and family unsure why she is on steroids and unable to determine from records reason for steroids DC prednisone -DC Percocet -Minimize all sedating medication. -5/21 MRI brain negative acute infarct. Multiple remote infarcts see results below -Will obtain EEG although only a remote possibility of seizure activity.  Anxiety/Agitation/Psychotic episode -5/19 consulted psychiatry. -5/ 20 decrease Risperdal 0.25 mg BID -See DM neuropathy  Depression -Continue Wellbutrin 150 mg daily  Hypokalemia -Potassium goal> 4  Hypomagnesemia -Magnesium goal> 2  Abdominal pain -After consuming full breakfast which consisted of eggs, sausage  patient now has abdominal pain/nausea -Consult to nutrition place on strict 2000-calorie/day diet -KUB; showed stool burden rectosigmoid colon, start MiraLAX daily  Nutrition -Clear liquids          Goals of care -Spoke at length with patient and son concerning goals of care patient verified that she should be DO NOT RESUSCITATE. -Patient also knowledge that she ate hamburgers, french fries, diet Cokes at home which is what made her happy. Spoke at length with patient and son concerning what her wishes would be upon discharge to include possible palliative care. Patient will discuss with family    DVT prophylaxis: SCD Code Status: DO NOT RESUSCITATE Family Communication: No family available Disposition Plan: Resolution acute on chronic diastolic CHF/GI bleed   Consultants:  Dr.William Stacie [facility]    Procedures/Significant Events:  PICC 5/13 5/14 echocardiogram;- Left ventricle:-50% to 55%. -(grade 2 diastolic dysfunction). - Aortic valve: moderately restricted. mild to moderate regurgitation - Left atrium: moderately dilated.- Right ventricle: moderately dilated. -Right atrium: moderately dilated. -Tricuspid valve: moderate regurgitation. - Pulmonary arteries: PA peak pressure: 57 mm Hg (S). 5/16 bilateral lower extremity Doppler; negative DVT 5/18 KUB; negative ileus/SBO- stool burden rectosigmoid colon 5/21 MRA brain Wo contrast;-No acute intracranial abnormality. -chronic microvascular ischemia. -Remote cortical infarcts involving the posterior left frontal lobe in the right temporal lobe. -Remote cortical infarct adjacent tothe occipital horn of the left lateral ventricle. 5/22 EEG;-abnormal EEG due to generalized background slowing with triphasic waves, indicative of diffuse cerebral dysfunction. Nonspecific finding  toxic-metabolic or other diffuse physiologic abnormality.  Cultures 5/13 urine positive GROUP B STREP(S.AGALACTIAE 5/13 MRSA by PCR negative  5/21 blood left arm NGTD 5/21 blood right hand positive coag negative staph most likely contaminant 5/21 urine positive yeast  Antimicrobials: Ceftriaxone 5/14>>5/19 Diflucan  5/23>>   Devices    LINES / TUBES:      Continuous Infusions: . sodium chloride 10 mL/hr at 10/14/15 1032     Subjective: 5/23, sitting in chair, A/O 1 ( does not know where, why, when) follows commands.      Objective: Filed Vitals:   10/14/15 2340 10/15/15 0000 10/15/15 0356 10/15/15 0700  BP:  110/36 145/42 133/45  Pulse: 69 71 70 70  Temp:  96.8 F (36 C) 98.6 F (37 C) 97.5 F (36.4 C)  TempSrc:  Axillary Axillary Axillary  Resp: 20 21 25 19   Height:      Weight:   82 kg (180 lb 12.4 oz)   SpO2:  92% 92% 100%    Intake/Output Summary (Last 24 hours) at 10/15/15 [id number] Last data filed at 10/15/15 0600  Gross per 24 hour  Intake    300 ml  Output    850 ml  Net   -550 ml   Filed Weights   10/13/15 0123 10/14/15 0500 10/15/15 0356  Weight: 87.7 kg (193 lb 5.5 oz) 81.9 kg (180 lb 8.9 oz) 82 kg (180 lb 12.4 oz)    Examination: General: A/O 1, (does not know where, when, why), sitting in chair follows commands no acute respiratory distress Eyes: negative scleral hemorrhage, negative anisocoria, negative icterus ENT: Negative Runny nose, negative gingival bleeding, Neck:  Negative scars, masses, torticollis, lymphadenopathy, JVD Lungs: Clear to auscultation bilaterally without wheezes or crackles Cardiovascular: Regular rate and rhythm without murmur gallop or rub normal S1 and S2 Abdomen: Morbidly obese, positive abdominal pain with palpation, nondistended, positive soft, bowel sounds, no rebound, no ascites, no appreciable mass Extremities: No significant cyanosis, clubbing, bilateral pitting edema 1+ mid thigh Skin: Negative rashes, lesions, ulcers Psychiatric:  Poor understanding of disease process; awaiting psychiatry evaluation Central nervous system:  Unable to fully assess secondary to patient's lethargy.       Data Reviewed: Care during the described time interval was provided by me .  I have reviewed this patient's available data, including  medical history, events of note, physical examination, and all test results as part of my evaluation. I have personally reviewed and interpreted all radiology studies.  CBC:  Recent Labs Lab 10/09/15 0433 10/10/15 0905 10/13/15 0443 10/15/15 0414  WBC 10.1 8.3 12.5* 11.0*  NEUTROABS  --  6.6 10.4*  --   HGB 8.3* 8.3* 10.4* 9.3*  HCT 29.6* 28.9* 37.8 34.2*  MCV 75.3* 75.5* 77.8* 78.6  PLT 254 203 284 [id number]   Basic Metabolic Panel:  Recent Labs Lab 10/10/15 1620  10/11/15 1510 10/12/15 0437 10/13/15 0443 10/14/15 0540 10/15/15 0414  NA  --   --  141 144 143 145 141  K >7.5*  < > 4.3 4.2 4.0 3.7 3.5  CL  --   --  97* 96* 93* 99* 96*  CO2  --   --  31 33* 32 31 32  GLUCOSE  --   --  133* 95 102* 97 112*  BUN  --   --  47* 51* 53* 65* 64*  CREATININE  --   --  2.43* 2.30* 2.57* 2.75* 2.35*  CALCIUM  --   --  8.7* 8.8* 9.2 8.9 8.7*  MG 2.3  --  2.1 2.0 2.0 2.0  --   < > =  values in this interval not displayed. GFR: Estimated Creatinine Clearance: 21.6 mL/min (by C-G formula based on Cr of 2.35). Liver Function Tests:  Recent Labs Lab 10/10/15 0905 10/15/15 0414  AST 15 26  ALT 13* 16  ALKPHOS 66 88  BILITOT 1.2 2.9*  PROT 4.6* 5.7*  ALBUMIN 2.2* 2.5*   No results for input(s): LIPASE, AMYLASE in the last 168 hours. No results for input(s): AMMONIA in the last 168 hours. Coagulation Profile:  Recent Labs Lab 10/10/15 0500 10/11/15 0254 10/12/15 0437 10/13/15 0443 10/14/15 0540  INR 1.30 1.22 1.31 1.26 1.40   Cardiac Enzymes:  Recent Labs Lab 10/10/15 1620 10/10/15 1627 10/11/15 0254  TROPONINI 0.04* <0.03 0.05*   BNP (last 3 results)  Recent Labs  07/22/15 1503  PROBNP >5000.0*   HbA1C: No results for input(s): HGBA1C in the last 72 hours. CBG:  Recent Labs Lab 10/12/15 0727  GLUCAP 89   Lipid Profile: No results for input(s): CHOL, HDL, LDLCALC, TRIG, CHOLHDL, LDLDIRECT in the last 72 hours. Thyroid Function Tests: No results for  input(s): TSH, T4TOTAL, FREET4, T3FREE, THYROIDAB in the last 72 hours. Anemia Panel: No results for input(s): VITAMINB12, FOLATE, FERRITIN, TIBC, IRON, RETICCTPCT in the last 72 hours. Urine analysis:    Component Value Date/Time   COLORURINE YELLOW 10/05/2015 1233   APPEARANCEUR TURBID* 10/05/2015 1233   LABSPEC 1.019 10/05/2015 1233   LABSPEC 1.010 06/21/2013 1213   PHURINE 5.0 10/05/2015 1233   PHURINE 6.0 06/21/2013 1213   GLUCOSEU NEGATIVE 10/05/2015 1233   GLUCOSEU NEGATIVE 03/20/2014 1454   GLUCOSEU Negative 06/21/2013 1213   HGBUR LARGE* 10/05/2015 1233   HGBUR Negative 06/21/2013 1213   BILIRUBINUR NEGATIVE 10/05/2015 1233   BILIRUBINUR Negative 06/21/2013 1213   KETONESUR NEGATIVE 10/05/2015 1233   KETONESUR Negative 06/21/2013 1213   PROTEINUR 100* 10/05/2015 1233   PROTEINUR 100 06/21/2013 1213   UROBILINOGEN 0.2 03/20/2014 1454   UROBILINOGEN 0.2 06/21/2013 1213   NITRITE NEGATIVE 10/05/2015 1233   NITRITE Negative 06/21/2013 1213   LEUKOCYTESUR LARGE* 10/05/2015 1233   LEUKOCYTESUR Negative 06/21/2013 1213   Sepsis Labs: @LABRCNTIP (procalcitonin:4,lacticidven:4)  ) Recent Results (from the past 240 hour(s))  Urine culture     Status: Abnormal   Collection Time: 10/05/15 12:33 PM  Result Value Ref Range Status   Specimen Description URINE, CATHETERIZED  Final   Special Requests NONE  Final   Culture (A)  Final    >=100,000 COLONIES/mL GROUP B STREP(S.AGALACTIAE)ISOLATED TESTING AGAINST S. AGALACTIAE NOT ROUTINELY PERFORMED DUE TO PREDICTABILITY OF AMP/PEN/VAN SUSCEPTIBILITY.    Report Status 10/06/2015 FINAL  Final  MRSA PCR Screening     Status: None   Collection Time: 10/05/15  5:38 PM  Result Value Ref Range Status   MRSA by PCR NEGATIVE NEGATIVE Final    Comment:        The GeneXpert MRSA Assay (FDA approved for NASAL specimens only), is one component of a comprehensive MRSA colonization surveillance program. It is not intended to diagnose  MRSA infection nor to guide or monitor treatment for MRSA infections.   Culture, blood (routine x 2)     Status: None (Preliminary result)   Collection Time: 10/13/15  9:08 AM  Result Value Ref Range Status   Specimen Description BLOOD LEFT ARM  Final   Special Requests BOTTLES DRAWN AEROBIC ONLY 5CC  Final   Culture NO GROWTH 1 DAY  Final   Report Status PENDING  Incomplete  Culture, blood (routine x 2)  Status: None (Preliminary result)   Collection Time: 10/13/15  9:15 AM  Result Value Ref Range Status   Specimen Description BLOOD RIGHT HAND  Final   Special Requests BOTTLES DRAWN AEROBIC ONLY 10CC  Final   Culture  Setup Time   Final    GRAM POSITIVE COCCI IN CLUSTERS AEROBIC BOTTLE ONLY Organism ID to follow CRITICAL RESULT CALLED TO, READ BACK BY AND VERIFIED WITH: V BRYK,PHARMD @0646  09/2315 MKELLY    Culture NO GROWTH 1 DAY  Final   Report Status PENDING  Incomplete  Blood Culture ID Panel (Reflexed)     Status: Abnormal   Collection Time: 10/13/15  9:15 AM  Result Value Ref Range Status   Enterococcus species NOT DETECTED NOT DETECTED Final   Vancomycin resistance NOT DETECTED NOT DETECTED Final   Listeria monocytogenes NOT DETECTED NOT DETECTED Final   Staphylococcus species DETECTED (A) NOT DETECTED Final    Comment: CRITICAL RESULT CALLED TO, READ BACK BY AND VERIFIED WITH: V BRYK,PHARMD @0646  10/15/15 MKELLY    Staphylococcus aureus NOT DETECTED NOT DETECTED Final   Methicillin resistance DETECTED (A) NOT DETECTED Final    Comment: CRITICAL RESULT CALLED TO, READ BACK BY AND VERIFIED WITH: V BRYK,PHARMD @0646  10/15/15 MKELLY    Streptococcus species NOT DETECTED NOT DETECTED Final   Streptococcus agalactiae NOT DETECTED NOT DETECTED Final   Streptococcus pneumoniae NOT DETECTED NOT DETECTED Final   Streptococcus pyogenes NOT DETECTED NOT DETECTED Final   Acinetobacter baumannii NOT DETECTED NOT DETECTED Final   Enterobacteriaceae species NOT DETECTED NOT  DETECTED Final   Enterobacter cloacae complex NOT DETECTED NOT DETECTED Final   Escherichia coli NOT DETECTED NOT DETECTED Final   Klebsiella oxytoca NOT DETECTED NOT DETECTED Final   Klebsiella pneumoniae NOT DETECTED NOT DETECTED Final   Proteus species NOT DETECTED NOT DETECTED Final   Serratia marcescens NOT DETECTED NOT DETECTED Final   Carbapenem resistance NOT DETECTED NOT DETECTED Final   Haemophilus influenzae NOT DETECTED NOT DETECTED Final   Neisseria meningitidis NOT DETECTED NOT DETECTED Final   Pseudomonas aeruginosa NOT DETECTED NOT DETECTED Final   Candida albicans NOT DETECTED NOT DETECTED Final   Candida glabrata NOT DETECTED NOT DETECTED Final   Candida krusei NOT DETECTED NOT DETECTED Final   Candida parapsilosis NOT DETECTED NOT DETECTED Final   Candida tropicalis NOT DETECTED NOT DETECTED Final  Culture, Urine     Status: Abnormal   Collection Time: 10/13/15  5:19 PM  Result Value Ref Range Status   Specimen Description URINE, CATHETERIZED  Final   Special Requests NONE  Final   Culture >=100,000 COLONIES/mL YEAST (A)  Final   Report Status 10/14/2015 FINAL  Final         Radiology Studies: Mr Brain Wo Contrast  10/13/2015  CLINICAL DATA:  Progressive lower extremity weakness following syncopal episode three months ago. Mechanical fall earlier this week. Unable to get up from the commode yesterday. EXAM: MRI HEAD WITHOUT CONTRAST TECHNIQUE: Multiplanar, multiecho pulse sequences of the brain and surrounding structures were obtained without intravenous contrast. COMPARISON:  CT head without contrast 10/12/2015. FINDINGS: Diffusion-weighted images demonstrate no evidence for acute or subacute infarction. Mild generalized atrophy and and white matter disease is somewhat advanced for age. A remote cortical infarct is again seen in the posterior right temporal lobe. Areas of remote cortical infarction are present in the posterior left frontal lobe. A remote white  matter and cortical infarct is evident inferior to the occipital horn of the left lateral  ventricle. No acute hemorrhage or mass lesion is present. The internal auditory canals are normal bilaterally. The brainstem is within normal limits. Cerebellar atrophy is proportionate to the supratentorial atrophy. Flow is present in the major intracranial arteries. Bilateral lens replacements are present. The globes and orbits are intact. The paranasal sinuses are clear. There is some fluid in the mastoid air cells bilaterally, left greater than right. No obstructing nasopharyngeal lesion is evident. There is some fluid in the left posterior nasopharynx. IMPRESSION: 1. No acute intracranial abnormality. 2. Mild age advanced atrophy and diffuse white matter disease likely reflects the sequela of chronic microvascular ischemia. 3. Remote cortical infarcts involving the posterior left frontal lobe in the right temporal lobe. Remote cortical infarct adjacent to the occipital horn of the left lateral ventricle. Electronically Signed   By: San Morelle M.D.   On: 10/13/2015 14:50        Scheduled Meds: . allopurinol  100 mg Oral Daily  . antiseptic oral rinse  7 mL Mouth Rinse q12n4p  . aspirin EC  81 mg Oral Daily  . brimonidine  1 drop Both Eyes BID  . buPROPion  150 mg Oral Daily  . chlorhexidine  15 mL Mouth Rinse BID  . cholecalciferol  1,000 Units Oral BID  . hydrALAZINE  25 mg Oral Q6H  . isosorbide mononitrate  60 mg Oral Daily  . levothyroxine  137 mcg Oral QAC breakfast  . metoprolol  10 mg Intravenous Q4H  . mometasone-formoterol  2 puff Inhalation BID  . pantoprazole  40 mg Oral BID  . polyethylene glycol  17 g Oral Daily  . risperiDONE  0.25 mg Oral BID  . timolol  1 drop Both Eyes BID   Continuous Infusions: . sodium chloride 10 mL/hr at 10/14/15 1032     LOS: 10 days    Time spent: 40 minutes    WOODS, Geraldo Docker, MD [facility] Pager [phone]   If 7PM-7AM,  please contact night-coverage [email or web address] Password [email or web address] 10/15/2015, 8:08 AM

## 2015-10-15 NOTE — Progress Notes (Signed)
Physical Therapy Treatment Patient Details Name: Wendy Cole MRN: [id number] DOB: [birth year] Today's Date: 10/15/2015    History of Present Illness This 80 y.o. female was brought to ED when she was unable to get up from the toilet.  In ED pt was grossly volume overloaded and had Guiac + stool.  Hgb 5.8 and INR 5.8.  Dx: UTI, diastolic HF exacerbation, GI bleed, OSA, paroxysmal A-Fib, toxic metabolic encephalopathy w/ increasing confusion likely UTI contributing along w/ steroid induced delirium.[name]  PMH includes:  Anxiety/depression, A-Fib, chronic severe diastolic CHF, HTN, stage III CKD, h/o DVT, OSA, Breast CA, s/p Rt mastectomy.     PT Comments    Pt presented w/ increased lethargy and confusion compared to previous session, limiting her ability to participate. Additionally, pt c/o Rt shoulder pain, grimacing and guarding Rt shoulder w/ mobility.  She required total assist for bed mobility and transfers today.  At end of session pt coughing after assisted w/ small sips of broth and cranberry juice, RN notified of need for swallow evaluation.  Encouraged son to keep lights on during the day in an effort for a normalized sleep cycle.     Follow Up Recommendations  SNF;Supervision/Assistance - 24 hour     Equipment Recommendations  Other (comment) (TBD at next venue of care)    Recommendations for Other Services       Precautions / Restrictions Precautions Precautions: Fall Precaution Comments: monitor O2 Restrictions Weight Bearing Restrictions: No    Mobility  Bed Mobility Overal bed mobility: Needs Assistance;+2 for physical assistance;+ 2 for safety/equipment Bed Mobility: Supine to Sit     Supine to sit: +2 for physical assistance;HOB elevated;Total assist     General bed mobility comments: Assist to elevate trunk and managing Bil LEs.  With cues pt reaches out for support.  Use of bed pad for positioning.  Transfers Overall transfer level: Needs  assistance Equipment used: 2 person hand held assist Transfers: Set designer Transfers;Sit to/from Stand Sit to Stand: +2 physical assistance;+2 safety/equipment;Total assist   Squat pivot transfers: Total assist;+2 physical assistance;+2 safety/equipment     General transfer comment: Use of bed pad and gait belt in place to boost pt to squatting position.  Other than holding onto therapist w/ Lt UE, pt not assisting and requires total assist to pivot and direct hips to sit in recliner chair.  Ambulation/Gait             General Gait Details: unable to attempt   Stairs            Wheelchair Mobility    Modified Rankin (Stroke Patients Only)       Balance Overall balance assessment: Needs assistance Sitting-balance support: Bilateral upper extremity supported;Feet supported Sitting balance-Leahy Scale: Poor Sitting balance - Comments: Requires min>max assist sitting EOB due to Rt lateral lean sitting EOB ~8 minutes. Postural control: Right lateral lean;Posterior lean Standing balance support: During functional activity;Single extremity supported Standing balance-Leahy Scale: Poor Standing balance comment: Total assist needed                    Cognition Arousal/Alertness: Lethargic Behavior During Therapy: Flat affect Overall Cognitive Status: Impaired/Different from baseline Area of Impairment: Orientation;Attention;Memory;Safety/judgement;Problem solving;Following commands;Awareness Orientation Level: Disoriented to;Situation;Time;Place;Person Current Attention Level: Focused Memory: Decreased short-term memory Following Commands: Follows one step commands inconsistently;Follows one step commands with increased time Safety/Judgement: Decreased awareness of safety;Decreased awareness of deficits Awareness: Intellectual Problem Solving: Difficulty sequencing;Requires verbal cues;Slow processing;Decreased initiation;Requires tactile cues  General Comments:  Pt minimally verbal but as session progressed she began answering more yes/no questions.  Keeps eyes closed until sitting EOB    Exercises      General Comments General comments (skin integrity, edema, etc.): VSS.  At end of session OT assisted pt to take small sips of broth and cranberry juice.  Pt began coughing shortly thereafter.  RN made aware that pt will need swallow evaluation.      Pertinent Vitals/Pain Pain Assessment: Faces Faces Pain Scale: Hurts even more Pain Location: Rt shoulder Pain Descriptors / Indicators: Grimacing;Guarding;Moaning Pain Intervention(s): Limited activity within patient's tolerance;Monitored during session;Repositioned    Home Living                      Prior Function            PT Goals (current goals can now be found in the care plan section) Acute Rehab PT Goals Patient Stated Goal: none stated PT Goal Formulation: With family Time For Goal Achievement: 10/23/15 Potential to Achieve Goals: Fair Progress towards PT goals: Not progressing toward goals - comment (due to increasing confusion)    Frequency  Min 2X/week    PT Plan Current plan remains appropriate    Co-evaluation PT/OT/SLP Co-Evaluation/Treatment: Yes Reason for Co-Treatment: For patient/therapist safety;Complexity of the patient's impairments (multi-system involvement) PT goals addressed during session: Mobility/safety with mobility;Balance;Strengthening/ROM       End of Session Equipment Utilized During Treatment: Gait belt;Oxygen Activity Tolerance: Patient limited by fatigue;Patient limited by lethargy;Patient limited by pain Patient left: in chair;with call bell/phone within reach;with chair alarm set;with family/visitor present     Time: [id number] PT Time Calculation (min) (ACUTE ONLY): 28 min  Charges:  $Therapeutic Activity: 8-22 mins                    G Codes:       Collie Siad PT, DPT  Pager: [phone] Phone: [phone] 10/15/2015, 11:00  AM

## 2015-10-15 NOTE — Evaluation (Signed)
Clinical/Bedside Swallow Evaluation Patient Details  Name: Wendy Cole MRN: [id number] Date of Birth: Apr 07, 1936  Today's Date: 10/15/2015 Time: SLP Start Time (ACUTE ONLY): 1600 SLP Stop Time (ACUTE ONLY): 1615 SLP Time Calculation (min) (ACUTE ONLY): 15 min  Past Medical History:  Past Medical History  Diagnosis Date  . Anemia   . Anxiety states   . Depressive disorder, not elsewhere classified   . Type II or unspecified type diabetes mellitus without mention of complication, not stated as uncontrolled   . Hyperlipidemia   . Unspecified essential hypertension   . Renal insufficiency   . Osteoarthritis   . Gout   . Iritis   . Nonischemic cardiomyopathy (HCC)     EF 20 to 25% per echo 06/2011  . Chronic anticoagulation   . NSTEMI (non-ST elevated myocardial infarction) [facility]) Feb 2013    06/2011 cath  Mild nonobstructive disease. No LV gram  . PAF (paroxysmal atrial fibrillation) ([place])   . Obesity   . Cancer [facility]) 2010    Right breast  . DVT (deep venous thrombosis) ([place]) 2010  . Stroke [facility]) 2004    Left upper lobe  . Thyroid disease 1969    Three fourth of Thyroid removed  . Complication of anesthesia     "fights it", for colonoscopy  . HOH (hard of hearing)     deaf in L completely  . OSA on CPAP     CPAP at night  . GERD (gastroesophageal reflux disease)   . Hypothyroidism   . Peripheral vascular disease ([place]) Jan. 2015   Past Surgical History:  Past Surgical History  Procedure Laterality Date  . Breast lumpectomy    . Cataract extraction    . Foot surgery    . Tonsillectomy  1949  . Abdominal hysterectomy  1974  . Eye surgery  2008    Glaucoma shunt Right eye  . Parotid gland tumor excision  2000  . Cystectomy  1974    Left Breast, chin, left of groin area  . Thyroidectomy, partial  1969  . Endarterectomy  11/18/2011    rocedure: ENDARTERECTOMY CAROTID;  Surgeon: Conrad Bluffton, MD;  Location: [place];  Service: Vascular;  Laterality: Left;  [name]  Mastectomy  right  . Mastectomy    . Cardiac catheterization      no stent  . Colonoscopy w/ polypectomy    . Endarterectomy  01/12/2012    Procedure: ENDARTERECTOMY CAROTID;  Surgeon: Conrad Meire Grove, MD;  Location: [facility] OR;  Service: Vascular;  Laterality: Right;  Right carotid endarterectomy with 1cm x 6cm vascuguard patch angioplasty.  . Carotid endarterectomy Left 11-18-11    cea  . Carotid endarterectomy Right 01-12-12    cea  . Left and right heart catheterization with coronary/graft angiogram Left 07/20/2011    Procedure: LEFT AND RIGHT HEART CATHETERIZATION WITH Beatrix Fetters;  Surgeon: Peter M Martinique, MD;  Location: [facility] C[facility] LAB;  Service: Cardiovascular;  Laterality: Left;   HPI:  This 80 y.o. female was brought to ED when she was unable to get up from the toilet. In ED pt was grossly volume overloaded and had Guiac + stool. Hgb 5.8 and INR 5.8. Dx: UTI, diastolic HF exacerbation, GI bleed, OSA, paroxysmal A-Fib, toxic metabolic encephalopathy w/ increasing confusion likely UTI contributing along w/ steroid induced delirium.[name] PMH includes: Anxiety/depression, A-Fib, chronic severe diastolic CHF, HTN, stage III CKD, h/o DVT, OSA, Breast CA, s/p Rt mastectomy.  Has been on clear liquid diet - noted  to cough with liquids during OT/PT session today.   Assessment / Plan / Recommendation Clinical Impression  Pt alert, but confused, maintaining eyes closed during assessment, and easily irritated.  No focal CN deficits.  Consumed limited sips of thin liquid with mod cues needed to attend to POs, seal lips around straw.  No overt s/s of aspiration noted, but pt declined further assessment.  Given high respiratory needs (venti mask), MS changes, and observations of coughing after POs earlier today, recommend ongoing assessment to ensure safety with POs.  Pt's spouse agrees.  Continue clear liquids - hold if not tolerating. SLP to f/u next date.     Aspiration Risk  Mild aspiration risk     Diet Recommendation   continue clears  Medication Administration: Whole meds with liquid    Other  Recommendations Oral Care Recommendations: Oral care BID   Follow up Recommendations   (tba)    Frequency and Duration min 2x/week  1 week       Prognosis Prognosis for Safe Diet Advancement: Fair      Swallow Study   General Date of Onset: 10/15/15 HPI: This 80 y.o. female was brought to ED when she was unable to get up from the toilet. In ED pt was grossly volume overloaded and had Guiac + stool. Hgb 5.8 and INR 5.8. Dx: UTI, diastolic HF exacerbation, GI bleed, OSA, paroxysmal A-Fib, toxic metabolic encephalopathy w/ increasing confusion likely UTI contributing along w/ steroid induced delirium.[name] PMH includes: Anxiety/depression, A-Fib, chronic severe diastolic CHF, HTN, stage III CKD, h/o DVT, OSA, Breast CA, s/p Rt mastectomy.  Has been on clear liquid diet - noted to cough with liquids during OT/PT session today. Type of Study: Bedside Swallow Evaluation Previous Swallow Assessment: no Diet Prior to this Study:  (clears) Temperature Spikes Noted: No Respiratory Status: Venti-mask History of Recent Intubation: No Behavior/Cognition: Alert;Requires cueing;Confused Oral Cavity Assessment: Dried secretions Oral Care Completed by SLP: No Vision: Functional for self-feeding Self-Feeding Abilities: Able to feed self Patient Positioning: Upright in bed Baseline Vocal Quality: Normal Volitional Cough: Strong Volitional Swallow: Unable to elicit    Oral/Motor/Sensory Function Overall Oral Motor/Sensory Function: Within functional limits   Ice Chips Ice chips: Within functional limits   Thin Liquid Thin Liquid: Within functional limits Presentation: Straw    Nectar Thick Nectar Thick Liquid: Not tested   Honey Thick Honey Thick Liquid: Not tested   Puree Puree: Not tested   Solid   GO   Solid: Not tested       Estill Bamberg L. Tivis Ringer, [place] CCC/SLP Pager  [phone]  Juan Quam Laurice 10/15/2015,4:26 PM

## 2015-10-15 NOTE — Progress Notes (Signed)
10/15/2015 Patient foley was removed at 1120. She had 150cc amber urine and odor was noted. [facility] RN.

## 2015-10-16 ENCOUNTER — Ambulatory Visit: Payer: Medicare Other | Admitting: Internal Medicine

## 2015-10-16 MED ORDER — WARFARIN - PHARMACIST DOSING INPATIENT
Freq: Every day | Status: DC
Start: 2015-10-16 — End: 2015-10-30
  Administered 2015-10-17 – 2015-10-23 (×4)

## 2015-10-16 MED ORDER — WARFARIN SODIUM 2 MG PO TABS
2.0000 mg | ORAL_TABLET | Freq: Once | ORAL | Status: AC
  Administered 2015-10-16: 2 mg via ORAL
  Filled 2015-10-16: qty 1

## 2015-10-16 NOTE — Progress Notes (Signed)
CSW spoke with pt son- still no SNF choice- they are planning to view facilities Friday and make choice from there  CSW will continue to follow  Wendy Cole, [place] Social Worker [phone]

## 2015-10-16 NOTE — Progress Notes (Signed)
[facility] TEAM 1 - Stepdown/ICU TEAM  Wendy Cole  [id number] DOB: 22-May-1936 DOA: 10/05/2015 PCP: Walker Kehr, MD    Brief Narrative:  80yo F HxAnxiety, Depression, CVA, CAD, Paroxysmal Atrial Fibrillation on Warfarin, Chronic Severe Diastolic CHF, HTN, Stage III CKD, Hypothyroid, HLD, DVT, OSA, and Breast Cancer S/P R Mastectomy who presented to the ED complaining of B LE swelling and shortness of breath, with increasing weakness since DC from hospital on 07-30-15 due to a syncopal episode. She called EMS after she was unable to get off the toilet.   In the ED the patient was grossly volume overloaded on exam, and guiac positive w/ brown stool.  She was found to have a Hgb of 5.8, and an INR of 5.8  Assessment & Plan:  Obtundation - Toxic metabolic encephalopathy - Agitation - Psychosis  B12, folate, and ammonia recently normal - UTI likely contributing but has now been tx for an extended period of time - mental status has been slow to normalize - Psych has evaluated and suggests this may be steroid induces delirium - now off steroids - mental status appears to be slowly improving in general, but I question her present competency to make complex decisions    Staph agalactiae UTI  Has completed a course of rocephin   Possible Yeast UTI In setting of altered mental status pt is being tx to determine if it will improve her encephalopathy  GI bleed no evidence of large volume gross blood loss - stool was brown but guiac + at admission, at same time INR was supratherapeutic - empiric PPI - discussed w/ husband and pt at length - pt strongly desires resuming coumadin, but does not desire GI eval - I agree she is not in a stable enough state to do well w/ colo or EGD - will resume wafarin w/o overlap and follow    Acute blood loss anemia  Hgb 7.9 07/27/15 - now s/p 3U PRBC - stable at this time w/o evidence of ongoing blood loss    Recent Labs Lab 10/10/15 0905 10/13/15 0443  10/15/15 0414  HGB 8.3* 10.4* 9.3*    Supratherapeutic INR corrected w/ vitamin K - INR normalized   Recent Labs Lab 10/10/15 0500 10/11/15 0254 10/12/15 0437 10/13/15 0443 10/14/15 0540  INR 1.30 1.22 1.31 1.26 1.40    Acute exacerbation of Chronic grade 2 diastolic CHF baseline weight is 93kg (07/27/15) - net negative ~24.5L since admit - pt is now euvolemic  Filed Weights   10/14/15 0500 10/15/15 0356 10/16/15 0148  Weight: 81.9 kg (180 lb 8.9 oz) 82 kg (180 lb 12.4 oz) 81.647 kg (180 lb)    CKD stage III crt 2.33 at time of d/c 07/27/15 - crt has returned to baseline    Recent Labs Lab 10/10/15 0905 10/11/15 1510 10/12/15 0437 10/13/15 0443 10/14/15 0540 10/15/15 0414  CREATININE 1.96* 2.43* 2.30* 2.57* 2.75* 2.35*    Paroxysmal Atrial fibrillation currently in NSR - anticoag was on hold due to GIB - after lengthy discussion w/ patient (?competence) and husband, we have decided to resume warfarin w/o heparin overlap, and follow for recurrence of bleeding w/o GI eval   Coag neg Staph in 1 of 2 blood cx - ?bacteremia  ?simple contaminant - f/u blood cx now that abx course complete   OSA BIPAP QHS and when napping during day   DM 2  A1c erroneous (<4.3) - CBG well controlled   HLD  Hypothyroidism continue Synthroid  Remote hx of DVT most recent duplex in 2014 was negative for DVT - recheck this admit w/o DVT    Anxiety  Depression  Morbid obesity   Disposition  The patient's husband makes it very clear that he does not feel he can care for her at home - I agree that SNF would be most appropriate for this patient - she is presently entirely unwilling to even consider it but I am not convinced she is mentally competent at this time - we will continue to work with PT/OT and follow her mental state but a formal competency evaluation may be required prior to her ultimate disposition  DVT prophylaxis: SCDs Code Status: NO CODE  Family Communication: spoke  w/ husband at bedside   Disposition Plan: SDU  Consultants:  CHF Team [facility]   Procedures:  PICC 5/13 B LE venous duplex - 5/16 - negative for DVT   Antimicrobials:  Ceftriaxone 5/14 > 5/19  Subjective: The patient is awake but drifts back off in the sleep multiple times during our conversation.  She complains of "hurting all over" but denies chest pain shortness of breath fevers chills or abdominal pain.  Objective: Blood pressure 117/54, pulse 62, temperature 97.7 F (36.5 C), temperature source Oral, resp. rate 23, height 5\' 6"  (1.676 m), weight 81.647 kg (180 lb), SpO2 100 %.  Intake/Output Summary (Last 24 hours) at 10/16/15 1413 Last data filed at 10/16/15 1300  Gross per 24 hour  Intake    750 ml  Output    290 ml  Net    460 ml   Filed Weights   10/14/15 0500 10/15/15 0356 10/16/15 0148  Weight: 81.9 kg (180 lb 8.9 oz) 82 kg (180 lb 12.4 oz) 81.647 kg (180 lb)    Examination: General: No acute resp distress at rest in bed  Lungs: Poor air movement throughout all fields with no wheeze or focal crackles Cardiovascular: Regular rate and rhythm without murmur or gallup  Abdomen: Obese, nondistended, soft, bowel sounds positive, no rebound Extremities: No significant cyanosis, clubbing, or edema bilateral lower extremities   CBC:  Recent Labs Lab 10/10/15 0905 10/13/15 0443 10/15/15 0414  WBC 8.3 12.5* 11.0*  NEUTROABS 6.6 10.4*  --   HGB 8.3* 10.4* 9.3*  HCT 28.9* 37.8 34.2*  MCV 75.5* 77.8* 78.6  PLT 203 284 [id number]   Basic Metabolic Panel:  Recent Labs Lab 10/10/15 1620  10/11/15 1510 10/12/15 0437 10/13/15 0443 10/14/15 0540 10/15/15 0414  NA  --   --  141 144 143 145 141  K >7.5*  < > 4.3 4.2 4.0 3.7 3.5  CL  --   --  97* 96* 93* 99* 96*  CO2  --   --  31 33* 32 31 32  GLUCOSE  --   --  133* 95 102* 97 112*  BUN  --   --  47* 51* 53* 65* 64*  CREATININE  --   --  2.43* 2.30* 2.57* 2.75* 2.35*  CALCIUM  --   --  8.7* 8.8* 9.2 8.9 8.7*  MG  2.3  --  2.1 2.0 2.0 2.0  --   < > = values in this interval not displayed. GFR: Estimated Creatinine Clearance: 21.6 mL/min (by C-G formula based on Cr of 2.35).  Liver Function Tests:  Recent Labs Lab 10/10/15 0905 10/15/15 0414  AST 15 26  ALT 13* 16  ALKPHOS 66 88  BILITOT 1.2 2.9*  PROT 4.6* 5.7*  ALBUMIN  2.2* 2.5*   Coagulation Profile:  Recent Labs Lab 10/10/15 0500 10/11/15 0254 10/12/15 0437 10/13/15 0443 10/14/15 0540  INR 1.30 1.22 1.31 1.26 1.40    Cardiac Enzymes:  Recent Labs Lab 10/10/15 1620 10/10/15 1627 10/11/15 0254  TROPONINI 0.04* <0.03 0.05*   CBG:  Recent Labs Lab 10/12/15 0727  GLUCAP 89    Recent Results (from the past 240 hour(s))  Culture, blood (routine x 2)     Status: None (Preliminary result)   Collection Time: 10/13/15  9:08 AM  Result Value Ref Range Status   Specimen Description BLOOD LEFT ARM  Final   Special Requests BOTTLES DRAWN AEROBIC ONLY 5CC  Final   Culture NO GROWTH 2 DAYS  Final   Report Status PENDING  Incomplete  Culture, blood (routine x 2)     Status: Abnormal (Preliminary result)   Collection Time: 10/13/15  9:15 AM  Result Value Ref Range Status   Specimen Description BLOOD RIGHT HAND  Final   Special Requests BOTTLES DRAWN AEROBIC ONLY 10CC  Final   Culture  Setup Time   Final    GRAM POSITIVE COCCI IN CLUSTERS AEROBIC BOTTLE ONLY CRITICAL RESULT CALLED TO, READ BACK BY AND VERIFIED WITH: V BRYK,PHARMD @0646  09/2315 MKELLY    Culture STAPHYLOCOCCUS SPECIES (COAGULASE NEGATIVE) (A)  Final   Report Status PENDING  Incomplete  Blood Culture ID Panel (Reflexed)     Status: Abnormal   Collection Time: 10/13/15  9:15 AM  Result Value Ref Range Status   Enterococcus species NOT DETECTED NOT DETECTED Final   Vancomycin resistance NOT DETECTED NOT DETECTED Final   Listeria monocytogenes NOT DETECTED NOT DETECTED Final   Staphylococcus species DETECTED (A) NOT DETECTED Final    Comment: CRITICAL RESULT  CALLED TO, READ BACK BY AND VERIFIED WITH: V BRYK,PHARMD @0646  10/15/15 MKELLY    Staphylococcus aureus NOT DETECTED NOT DETECTED Final   Methicillin resistance DETECTED (A) NOT DETECTED Final    Comment: CRITICAL RESULT CALLED TO, READ BACK BY AND VERIFIED WITH: V BRYK,PHARMD @0646  10/15/15 MKELLY    Streptococcus species NOT DETECTED NOT DETECTED Final   Streptococcus agalactiae NOT DETECTED NOT DETECTED Final   Streptococcus pneumoniae NOT DETECTED NOT DETECTED Final   Streptococcus pyogenes NOT DETECTED NOT DETECTED Final   Acinetobacter baumannii NOT DETECTED NOT DETECTED Final   Enterobacteriaceae species NOT DETECTED NOT DETECTED Final   Enterobacter cloacae complex NOT DETECTED NOT DETECTED Final   Escherichia coli NOT DETECTED NOT DETECTED Final   Klebsiella oxytoca NOT DETECTED NOT DETECTED Final   Klebsiella pneumoniae NOT DETECTED NOT DETECTED Final   Proteus species NOT DETECTED NOT DETECTED Final   Serratia marcescens NOT DETECTED NOT DETECTED Final   Carbapenem resistance NOT DETECTED NOT DETECTED Final   Haemophilus influenzae NOT DETECTED NOT DETECTED Final   Neisseria meningitidis NOT DETECTED NOT DETECTED Final   Pseudomonas aeruginosa NOT DETECTED NOT DETECTED Final   Candida albicans NOT DETECTED NOT DETECTED Final   Candida glabrata NOT DETECTED NOT DETECTED Final   Candida krusei NOT DETECTED NOT DETECTED Final   Candida parapsilosis NOT DETECTED NOT DETECTED Final   Candida tropicalis NOT DETECTED NOT DETECTED Final  Culture, Urine     Status: Abnormal   Collection Time: 10/13/15  5:19 PM  Result Value Ref Range Status   Specimen Description URINE, CATHETERIZED  Final   Special Requests NONE  Final   Culture >=100,000 COLONIES/mL YEAST (A)  Final   Report Status 10/14/2015 FINAL  Final  Scheduled Meds: . allopurinol  100 mg Oral Daily  . antiseptic oral rinse  7 mL Mouth Rinse q12n4p  . aspirin EC  81 mg Oral Daily  . brimonidine  1 drop Both Eyes  BID  . buPROPion  150 mg Oral Daily  . chlorhexidine  15 mL Mouth Rinse BID  . cholecalciferol  1,000 Units Oral BID  . fluconazole (DIFLUCAN) IV  200 mg Intravenous Q24H  . hydrALAZINE  25 mg Oral Q6H  . isosorbide mononitrate  60 mg Oral Daily  . levothyroxine  137 mcg Oral QAC breakfast  . metoprolol  10 mg Intravenous Q4H  . mometasone-formoterol  2 puff Inhalation BID  . pantoprazole  40 mg Oral BID  . polyethylene glycol  17 g Oral Daily  . risperiDONE  0.25 mg Oral BID  . timolol  1 drop Both Eyes BID   Continuous Infusions: . sodium chloride 10 mL/hr at 10/14/15 1032     LOS: 11 days   Time spent: 35 minutes   Cherene Altes, MD [facility] Office  [phone] Pager - Text Page per Shea Evans as per below:  On-Call/Text Page:      [email or web address]      password [email or web address]  If 7PM-7AM, please contact night-coverage [email or web address] Password [email or web address] 10/16/2015, 2:13 PM

## 2015-10-16 NOTE — Progress Notes (Signed)
Patient has not urinate this 12 hour shift.  Bladder scan done, 180 ml.  In and out cath done and able to drain 150 ml.

## 2015-10-16 NOTE — Care Management Important Message (Signed)
Important Message  Patient Details  Name: Wendy Cole MRN: [id number] Date of Birth: Jan 06, 1936   Medicare Important Message Given:  Yes    Loann Quill 10/16/2015, 10:44 AM

## 2015-10-16 NOTE — Progress Notes (Signed)
Speech Language Pathology Treatment: Dysphagia  Patient Details Name: Wendy Cole MRN: [id number] DOB: November 25, 1935 Today's Date: 10/16/2015 Time: 0722-5750 SLP Time Calculation (min) (ACUTE ONLY): 10 min  Assessment / Plan / Recommendation Clinical Impression  F/u after yesterday's limited swallow assessment.  Much more alert and interactive today.  On BiPAP this am, but transitioned to nasal cannula upon my arrival and oxygenating well.  Son present.  Pt drinking thin liquids with no overt s/s of dysphagia nor aspiration.  Lungs remain clear.  Recommend continuing clear liquid diet and advance per GI. Hold meal tray when pt is not alert.  No further SLP f/u is needed - please reorder SLP if symptoms recur.      HPI HPI: This 80 y.o. female was brought to ED when she was unable to get up from the toilet. In ED pt was grossly volume overloaded and had Guiac + stool. Hgb 5.8 and INR 5.8. Dx: UTI, diastolic HF exacerbation, GI bleed, OSA, paroxysmal A-Fib, toxic metabolic encephalopathy w/ increasing confusion likely UTI contributing along w/ steroid induced delirium.[name] PMH includes: Anxiety/depression, A-Fib, chronic severe diastolic CHF, HTN, stage III CKD, h/o DVT, OSA, Breast CA, s/p Rt mastectomy.  Has been on clear liquid diet - noted to cough with liquids during OT/PT session 5/23.      SLP Plan  All goals met     Recommendations  Diet recommendations: Thin liquid Medication Administration: Whole meds with liquid Supervision: Patient able to self feed Compensations: Slow rate;Small sips/bites Postural Changes and/or Swallow Maneuvers: Seated upright 90 degrees             Oral Care Recommendations: Oral care BID Follow up Recommendations: None Plan: All goals met     GO             Wendy Cole L. Tivis Ringer, [place] CCC/SLP Pager [phone]    Juan Quam Wendy Cole 10/16/2015, 11:11 AM

## 2015-10-16 NOTE — Progress Notes (Addendum)
ANTICOAGULATION CONSULT NOTE - Initial Consult  Pharmacy Consult for Coumadin Indication: atrial fibrillation  Allergies  Allergen Reactions  . Amlodipine     edema  . Atorvastatin Other (See Comments)    Muscle aches  . Colesevelam Other (See Comments)    unknown  . Coumadin [Warfarin Sodium]     "causes cramps" reaction to brand name only per pt  . Lasix [Furosemide] Other (See Comments)    Muscle cramps  . Statins Other (See Comments)    Muscle aches  . Tape Rash    Patient Measurements: Height: 5\' 6"  (167.6 cm) Weight: 180 lb (81.647 kg) IBW/kg (Calculated) : 59.3 Heparin Dosing Weight:   Vital Signs: Temp: 97.7 F (36.5 C) (05/24 1200) Temp Source: Oral (05/24 1200) BP: 117/54 mmHg (05/24 1229) Pulse Rate: 62 (05/24 1200)  Labs:  Recent Labs  10/14/15 0540 10/15/15 0414  HGB  --  9.3*  HCT  --  34.2*  PLT  --  297  LABPROT 17.2*  --   INR 1.40  --   CREATININE 2.75* 2.35*    Estimated Creatinine Clearance: 21.6 mL/min (by C-G formula based on Cr of 2.35).   Medical History: Past Medical History  Diagnosis Date  . Anemia   . Anxiety states   . Depressive disorder, not elsewhere classified   . Type II or unspecified type diabetes mellitus without mention of complication, not stated as uncontrolled   . Hyperlipidemia   . Unspecified essential hypertension   . Renal insufficiency   . Osteoarthritis   . Gout   . Iritis   . Nonischemic cardiomyopathy (HCC)     EF 20 to 25% per echo 06/2011  . Chronic anticoagulation   . NSTEMI (non-ST elevated myocardial infarction) [facility]) Feb 2013    06/2011 cath  Mild nonobstructive disease. No LV gram  . PAF (paroxysmal atrial fibrillation) ([place])   . Obesity   . Cancer [facility]) 2010    Right breast  . DVT (deep venous thrombosis) ([place]) 2010  . Stroke [facility]) 2004    Left upper lobe  . Thyroid disease 1969    Three fourth of Thyroid removed  . Complication of anesthesia     "fights it", for colonoscopy  . HOH  (hard of hearing)     deaf in L completely  . OSA on CPAP     CPAP at night  . GERD (gastroesophageal reflux disease)   . Hypothyroidism   . Peripheral vascular disease ([place]) Jan. 2015    Medications:  Scheduled:  . allopurinol  100 mg Oral Daily  . antiseptic oral rinse  7 mL Mouth Rinse q12n4p  . aspirin EC  81 mg Oral Daily  . brimonidine  1 drop Both Eyes BID  . buPROPion  150 mg Oral Daily  . chlorhexidine  15 mL Mouth Rinse BID  . cholecalciferol  1,000 Units Oral BID  . fluconazole (DIFLUCAN) IV  200 mg Intravenous Q24H  . hydrALAZINE  25 mg Oral Q6H  . isosorbide mononitrate  60 mg Oral Daily  . levothyroxine  137 mcg Oral QAC breakfast  . metoprolol  10 mg Intravenous Q4H  . mometasone-formoterol  2 puff Inhalation BID  . pantoprazole  40 mg Oral BID  . polyethylene glycol  17 g Oral Daily  . risperiDONE  0.25 mg Oral BID  . timolol  1 drop Both Eyes BID    Assessment: 80yo female with AFib, on Coumadin pta (5/13) with supratherapeutic INR of 5.13  now decreased to 1.4 on 5/22.  Hg 9.3 and pltc wnl.  Pt is to start Coumadin with no heparin bridge.  His dose pta was Coumadin 3mg  daily, but it was on hold due to INR 6.7 at Coumadin clinic on 5/12.  INR fell to 1.22 on 5/19, and has increased to 1.4 over the last few days.  He has received no Coumadin this admission.  Pt is on Fluconazole, which may increase Coumadin effect.  Pt noted as having allergy to brand-name Coumadin, which causes cramps.  I spoke with her and told her that we carry brand name Coumadin and she is OK with receiving this.  I also explained that we would be starting at a lower dose and checking daily labs.  Goal of Therapy:  INR 2-3 Monitor platelets by anticoagulation protocol: Yes   Plan:  Coumadin 2mg  po x 1 Daily INR  Gracy Bruins, PharmD Clinical Pharmacist [facility]

## 2015-10-16 NOTE — Progress Notes (Signed)
Pt has not urinated since foley was removed at 1120 5/23. Bladder scan showed 130-100 cc in bladder. In-and-out cath performed and 140 cc of cloudy, amber, malodorous with sediment urine was drained from bladder. Schorr, NP notified. Will continue to monitor.   Hart Rochester, RN, BSN

## 2015-10-17 LAB — CBC
HCT: 33.8 % — ABNORMAL LOW (ref 36.0–46.0)
Hemoglobin: 9.2 g/dL — ABNORMAL LOW (ref 12.0–15.0)
MCH: 21.5 pg — ABNORMAL LOW (ref 26.0–34.0)
MCHC: 27.2 g/dL — ABNORMAL LOW (ref 30.0–36.0)
MCV: 79 fL (ref 78.0–100.0)
PLATELETS: 337 10*3/uL (ref 150–400)
RBC: 4.28 MIL/uL (ref 3.87–5.11)
RDW: 24 % — AB (ref 11.5–15.5)
WBC: 7.7 10*3/uL (ref 4.0–10.5)

## 2015-10-17 LAB — COMPREHENSIVE METABOLIC PANEL
ALBUMIN: 2.5 g/dL — AB (ref 3.5–5.0)
ALK PHOS: 107 U/L (ref 38–126)
ALT: 21 U/L (ref 14–54)
AST: 35 U/L (ref 15–41)
Anion gap: 14 (ref 5–15)
BUN: 66 mg/dL — AB (ref 6–20)
CALCIUM: 8.8 mg/dL — AB (ref 8.9–10.3)
CO2: 31 mmol/L (ref 22–32)
CREATININE: 2.85 mg/dL — AB (ref 0.44–1.00)
Chloride: 95 mmol/L — ABNORMAL LOW (ref 101–111)
GFR calc Af Amer: 17 mL/min — ABNORMAL LOW (ref >60)
GFR calc non Af Amer: 15 mL/min — ABNORMAL LOW (ref >60)
GLUCOSE: 100 mg/dL — AB (ref 65–99)
Potassium: 3.5 mmol/L (ref 3.5–5.1)
SODIUM: 140 mmol/L (ref 135–145)
Total Bilirubin: 2.4 mg/dL — ABNORMAL HIGH (ref 0.3–1.2)
Total Protein: 6 g/dL — ABNORMAL LOW (ref 6.5–8.1)

## 2015-10-17 LAB — PROTIME-INR
INR: 1.86 — ABNORMAL HIGH (ref 0.00–1.49)
Prothrombin Time: 21.4 seconds — ABNORMAL HIGH (ref 11.6–15.2)

## 2015-10-17 LAB — CULTURE, BLOOD (ROUTINE X 2)

## 2015-10-17 MED ORDER — WARFARIN SODIUM 1 MG PO TABS
1.0000 mg | ORAL_TABLET | Freq: Once | ORAL | Status: AC
  Administered 2015-10-17: 1 mg via ORAL
  Filled 2015-10-17 (×2): qty 1

## 2015-10-17 MED ORDER — FLUCONAZOLE IN SODIUM CHLORIDE 100-0.9 MG/50ML-% IV SOLN
100.0000 mg | INTRAVENOUS | Status: DC
Start: 2015-10-17 — End: 2015-10-18
  Administered 2015-10-17 – 2015-10-18 (×2): 100 mg via INTRAVENOUS
  Filled 2015-10-17 (×4): qty 50

## 2015-10-17 NOTE — Progress Notes (Addendum)
PT Cancellation Note  Patient Details Name: SHAWNAE SADOWSKI MRN: [id number] DOB: 1935-08-02   Cancelled Treatment:    Reason Eval/Treat Not Completed: Other (comment)   Patient's bed was stuck in a semi-fowlers position and could not get foot of bed to lower/flatten. Additional functions not working were trendelenburg and reverse trendelenburg. Attempted unplugging bed to "reset" without success. Attempted fully elevating and fully lowering bed to "reset." RN made aware and reports she had no problems with bed today. Awaited her assistance (total time in room 23 minutes). Notified Biomedical engineer of bed issues and Justice Rocher, RN was going to "look into it."   Masahiro Iglesia 10/17/2015, 3:55 PM  Pager [phone]

## 2015-10-17 NOTE — Progress Notes (Signed)
PROGRESS NOTE    Wendy Cole  HRC:[id number] DOB: 1935-06-17 DOA: 10/05/2015 PCP: Walker Kehr, MD   Brief Narrative:  80 y.o. WF PMHx Anxiety, Depression,CVA, CAD native artery, Paroxysmal Atrial Fibrillation on Warfarin Rx, Chronic Diastolic CHF/Cardiomyopathy, NSTEMI, HTN, Stage III CKD, Hypothyroid, OSA and hx of Breast Cancer S/P Rt. Mastectomy HLD, DVT,   Who presents to the ED , presents to the Emergency Department today complaining of BLE swelling and shortness of breath. Noted increasing weakness since DC from hospital on 07-30-15 due to syncopal episode. Seen Neurology on 09-05-15 for follow up with no neurologic cause. Pt notes inability to ambulate due to weakness and called EMS as she was unable to get off of the toilet. Started using wheelchair in the past week due to weakness. Pt also notes right shoulder pain s/p mechanical fall earlier this week with no head trauma. Notes pain is 5/10 and aching. Pt has Chest tightness with SOB. No CP. No N/V/D. No headaches. No fevers. No hematochezia. No black tarry stools. No other symptoms noted.  Occult blood positive In ED. Patient has not noticed blood in urine or Stool.states allergic to Lasix will cause her extreme muscle spasms ~ 30-45 minutes post administration..states baseline weight is ~218 pounds(99 kg). States does not weigh herself daily   Assessment & Plan:   Principal Problem:   Absolute anemia Active Problems:   Essential hypertension   History of DVT (deep vein thrombosis)   OSA on CPAP   Anemia   CKD (chronic kidney disease) stage 3, GFR 30-59 ml/min   Stroke (HCC)   Chronic anticoagulation   Hypothyroidism   Chronic venous embolism and thrombosis of deep vessels of lower extremity (HCC)   Supratherapeutic INR   Lower GI bleed   Diabetes mellitus type 2, uncontrolled, with complications (HCC)   Anxiety   Depression   CAD in native artery   Paroxysmal atrial fibrillation (HCC)   Chronic diastolic CHF  (congestive heart failure) (HCC)   Cardiomyopathy (HCC)   HLD (hyperlipidemia)   CHF, acute on chronic (HCC)   Bleeding gastrointestinal   Urinary tract infection, site not specified   Controlled diabetes mellitus type 2 with complications (HCC)   Chronic kidney disease, stage III (moderate)   Abdominal pain   Altered mental state   Edema of both legs   Psychotic episode   Pulmonary edema   Altered mental status  Staph Agalactiae UTI  -Completed 5 day course Rocephin   Coag negative staph positive -1/2 blood cultures positive most likely contaminant  Funguria -5/21 urine positive yeast, normally do not treat as an infection however given patient's continued AMS believe would be prudent to treat. -Diflucan IV 200 mg daily 2 weeks -Discontinue Foley  Acute kidney injury -Most likely secondary to aggressive diuresis for CHF -Monitor creatinine and if> 3 would consult nephrology Lab Results  Component Value Date   CREATININE 2.85* 10/17/2015   CREATININE 2.35* 10/15/2015   CREATININE 2.75* 10/14/2015    Lower GI bleed -On admissions patient's hemoglobin 5.8, positive occult blood -5/13 transfuse 3 units PRBC -GI consulted; secondary to patient's poorly controlled respiratory/cardiac status GI deferred colonoscopy or EGD at this time. Will follow from a distance. -Stable hemoglobin; GI bleed most likely secondary to supratherapeutic INR  Supratherapeutic INR -INR = 6.7 at admission; - 5/17 WNL Lab 10/08/15 0544 10/09/15 0433 10/10/15 0905 10/13/15 0443  HGB 8.1* 8.3* 8.3* 10.4*    Chronic diastolic CHF/Cardiomyopathy -Echocardiogram; confirms dilated cardiomyopathy, diastolic CHF see  echocardiogram below -Fluid overloaded (edematous to hips) but significantly improved from admission. -strict in and out since admission -24.5 L -Daily weight Filed Weights   10/15/15 0356 10/16/15 0148 10/17/15 0423  Weight: 82 kg (180 lb 12.4 oz) 81.647 kg (180 lb) 83 kg (182 lb  15.7 oz)  -Hydralazine 25 mg QID -Imdur 60 mg daily -Metoprolol IV 10 mg  q 4hr;  -Out of bed to chair q shift  Paroxysmal Atrial fibrillation -A. fib, rate controlled; has been in NSR most of the time.  -See cardiomyopathy -Coumadin Restarted per Dr. Thereasa Solo note discussed risk and benefits with family .  OSA -5/21 overnight BIPAP on 16/3 With O2 bleed in  DM type 2 Controlled with complications; Diabetic neuropathy -5/13 A1c= <4.3 -Neurontin on hold  -5/25 AMS improving but still A/O 1  -Continue to hold all sedating medication  HLD -Lipid panel within ADA guidelines   Acute on CKD stage III (Baseline~1.82-2.12) Lab Results  Component Value Date   CREATININE 2.85* 10/17/2015   CREATININE 2.35* 10/15/2015   CREATININE 2.75* 10/14/2015   Hypothyroidism -Synthroid IV 62.5 g; restart Synthroid 137 g when patient's cognition improved. -TSH; 1.89  DVT -Patient had been on Coumadin unfortunately with GI bleed will hold  AMS/Steroid-induced Psychosis? 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 Since patient on minimal steroids and family unsure why she is on steroids and unable to determine from records reason for steroids DC prednisone -Minimize all sedating medication. -5/21 MRI brain negative acute infarct. Multiple remote infarcts see results below - EEG; abnormal but no seizure activity.  Anxiety/Agitation/Psychotic episode -5/19 consulted psychiatry. -Risperdal 0.25 mg BID -See DM neuropathy  Depression -Continue Wellbutrin 150 mg daily  Hypokalemia -Potassium goal> 4  Hypomagnesemia -Magnesium goal> 2  Abdominal pain -Resolved  Nutrition -Clear liquids  Hand pain -Rheumatoid arthritis? MCTD? Patient unable to provide additional information. May be why patient was on chronic steroids -ESR, CRP, RA pending       Goals of care -Spoke at length with patient and son concerning  goals of care patient verified that she should be DO NOT RESUSCITATE. -Patient also knowledge that she ate hamburgers, french fries, diet Cokes at home which is what made her happy. Spoke at length with patient and son concerning what her wishes would be upon discharge to include possible palliative care. Patient will discuss with family    DVT prophylaxis: Coumadin Code Status: DO NOT RESUSCITATE Family Communication: No family available Disposition Plan: SNF   Consultants:  Dr.William Outlaw [facility]    Procedures/Significant Events:  PICC 5/13 5/14 echocardiogram;- Left ventricle:-50% to 55%. -(grade 2 diastolic dysfunction). - Aortic valve: moderately restricted. mild to moderate regurgitation - Left atrium: moderately dilated.- Right ventricle: moderately dilated. -Right atrium: moderately dilated. -Tricuspid valve: moderate regurgitation. - Pulmonary arteries: PA peak pressure: 57 mm Hg (S). 5/16 bilateral lower extremity Doppler; negative DVT 5/18 KUB; negative ileus/SBO- stool burden rectosigmoid colon 5/21 MRA brain Wo contrast;-No acute intracranial abnormality. -chronic microvascular ischemia. -Remote cortical infarcts involving the posterior left frontal lobe in the right temporal lobe. -Remote cortical infarct adjacent tothe occipital horn of the left lateral ventricle. 5/22 EEG;-abnormal EEG due to generalized background slowing with triphasic waves, indicative of diffuse cerebral dysfunction. Nonspecific finding  toxic-metabolic or other diffuse physiologic abnormality.  Cultures 5/13 urine positive GROUP B STREP(S.AGALACTIAE 5/13 MRSA by PCR negative 5/21 blood left arm NGTD 5/21 blood right hand positive coag  negative staph most likely contaminant 5/21 urine positive yeast  Antimicrobials: Ceftriaxone 5/14>>5/19 Diflucan 5/23>>   Devices    LINES / TUBES:      Continuous Infusions: . sodium chloride 10 mL/hr at 10/17/15 0932      Subjective: 5/25, sitting in bed, A/O 1 ( does not know where, why, when) follows commands. C/O hand pain     Objective: Filed Vitals:   10/17/15 0800 10/17/15 1100 10/17/15 1200 10/17/15 1600  BP: 172/49 150/62 131/48 124/45  Pulse: 59 65 62 59  Temp: 98.8 F (37.1 C)  97.5 F (36.4 C) 98.2 F (36.8 C)  TempSrc: Axillary  Oral Oral  Resp: '18 14 17 16  ' Height:      Weight:      SpO2: 99% 99% 99% 96%    Intake/Output Summary (Last 24 hours) at 10/17/15 1828 Last data filed at 10/17/15 1208  Gross per 24 hour  Intake 153.83 ml  Output    350 ml  Net -196.17 ml   Filed Weights   10/15/15 0356 10/16/15 0148 10/17/15 0423  Weight: 82 kg (180 lb 12.4 oz) 81.647 kg (180 lb) 83 kg (182 lb 15.7 oz)    Examination: General: A/O 1, (does not know where, when, why), sitting in Bed follows commands no acute respiratory distress Eyes: negative scleral hemorrhage, negative anisocoria, negative icterus ENT: Negative Runny nose, negative gingival bleeding, Neck:  Negative scars, masses, torticollis, lymphadenopathy, JVD Lungs: Clear to auscultation bilaterally without wheezes or crackles Cardiovascular: Regular rate and rhythm without murmur gallop or rub normal S1 and S2 Abdomen: Morbidly obese, positive abdominal pain with palpation, nondistended, positive soft, bowel sounds, no rebound, no ascites, no appreciable mass Extremities: bilateral hand pain, swollen metacarpals.  Skin: Negative rashes, lesions, ulcers Psychiatric:  Poor understanding of disease process; awaiting psychiatry evaluation Central nervous system:  Unable to fully assess secondary to patient's lethargy.       Data Reviewed: Care during the described time interval was provided by me .  I have reviewed this patient's available data, including medical history, events of note, physical examination, and all test results as part of my evaluation. I have personally reviewed and interpreted all radiology  studies.  CBC:  Recent Labs Lab 10/13/15 0443 10/15/15 0414 10/17/15 0510  WBC 12.5* 11.0* 7.7  NEUTROABS 10.4*  --   --   HGB 10.4* 9.3* 9.2*  HCT 37.8 34.2* 33.8*  MCV 77.8* 78.6 79.0  PLT 284 297 355   Basic Metabolic Panel:  Recent Labs Lab 10/11/15 1510 10/12/15 0437 10/13/15 0443 10/14/15 0540 10/15/15 0414 10/17/15 0510  NA 141 144 143 145 141 140  K 4.3 4.2 4.0 3.7 3.5 3.5  CL 97* 96* 93* 99* 96* 95*  CO2 31 33* 32 31 32 31  GLUCOSE 133* 95 102* 97 112* 100*  BUN 47* 51* 53* 65* 64* 66*  CREATININE 2.43* 2.30* 2.57* 2.75* 2.35* 2.85*  CALCIUM 8.7* 8.8* 9.2 8.9 8.7* 8.8*  MG 2.1 2.0 2.0 2.0  --   --    GFR: Estimated Creatinine Clearance: 18 mL/min (by C-G formula based on Cr of 2.85). Liver Function Tests:  Recent Labs Lab 10/15/15 0414 10/17/15 0510  AST 26 35  ALT 16 21  ALKPHOS 88 107  BILITOT 2.9* 2.4*  PROT 5.7* 6.0*  ALBUMIN 2.5* 2.5*   No results for input(s): LIPASE, AMYLASE in the last 168 hours. No results for input(s): AMMONIA in the last 168 hours. Coagulation Profile:  Recent Labs Lab 10/11/15 0254 10/12/15 0437 10/13/15 0443 10/14/15 0540 10/17/15 0510  INR 1.22 1.31 1.26 1.40 1.86*   Cardiac Enzymes:  Recent Labs Lab 10/11/15 0254  TROPONINI 0.05*   BNP (last 3 results)  Recent Labs  07/22/15 1503  PROBNP >5000.0*   HbA1C: No results for input(s): HGBA1C in the last 72 hours. CBG:  Recent Labs Lab 10/12/15 0727  GLUCAP 89   Lipid Profile: No results for input(s): CHOL, HDL, LDLCALC, TRIG, CHOLHDL, LDLDIRECT in the last 72 hours. Thyroid Function Tests: No results for input(s): TSH, T4TOTAL, FREET4, T3FREE, THYROIDAB in the last 72 hours. Anemia Panel: No results for input(s): VITAMINB12, FOLATE, FERRITIN, TIBC, IRON, RETICCTPCT in the last 72 hours. Urine analysis:    Component Value Date/Time   COLORURINE YELLOW 10/05/2015 1233   APPEARANCEUR TURBID* 10/05/2015 1233   LABSPEC 1.019 10/05/2015  1233   LABSPEC 1.010 06/21/2013 1213   PHURINE 5.0 10/05/2015 1233   PHURINE 6.0 06/21/2013 1213   GLUCOSEU NEGATIVE 10/05/2015 1233   GLUCOSEU NEGATIVE 03/20/2014 1454   GLUCOSEU Negative 06/21/2013 1213   HGBUR LARGE* 10/05/2015 1233   HGBUR Negative 06/21/2013 1213   BILIRUBINUR NEGATIVE 10/05/2015 1233   BILIRUBINUR Negative 06/21/2013 1213   KETONESUR NEGATIVE 10/05/2015 1233   KETONESUR Negative 06/21/2013 1213   PROTEINUR 100* 10/05/2015 1233   PROTEINUR 100 06/21/2013 1213   UROBILINOGEN 0.2 03/20/2014 1454   UROBILINOGEN 0.2 06/21/2013 1213   NITRITE NEGATIVE 10/05/2015 1233   NITRITE Negative 06/21/2013 1213   LEUKOCYTESUR LARGE* 10/05/2015 1233   LEUKOCYTESUR Negative 06/21/2013 1213   Sepsis Labs: '@LABRCNTIP' (procalcitonin:4,lacticidven:4)  ) Recent Results (from the past 240 hour(s))  Culture, blood (routine x 2)     Status: None (Preliminary result)   Collection Time: 10/13/15  9:08 AM  Result Value Ref Range Status   Specimen Description BLOOD LEFT ARM  Final   Special Requests BOTTLES DRAWN AEROBIC ONLY 5CC  Final   Culture NO GROWTH 4 DAYS  Final   Report Status PENDING  Incomplete  Culture, blood (routine x 2)     Status: Abnormal   Collection Time: 10/13/15  9:15 AM  Result Value Ref Range Status   Specimen Description BLOOD RIGHT HAND  Final   Special Requests BOTTLES DRAWN AEROBIC ONLY 10CC  Final   Culture  Setup Time   Final    GRAM POSITIVE COCCI IN CLUSTERS AEROBIC BOTTLE ONLY CRITICAL RESULT CALLED TO, READ BACK BY AND VERIFIED WITH: V BRYK,PHARMD '@[id number]'  09/2315 MKELLY    Culture (A)  Final    STAPHYLOCOCCUS SPECIES (COAGULASE NEGATIVE) THE SIGNIFICANCE OF ISOLATING THIS ORGANISM FROM A SINGLE SET OF BLOOD CULTURES WHEN MULTIPLE SETS ARE DRAWN IS UNCERTAIN. PLEASE NOTIFY THE MICROBIOLOGY DEPARTMENT WITHIN ONE WEEK IF SPECIATION AND SENSITIVITIES ARE REQUIRED.    Report Status 10/17/2015 FINAL  Final  Blood Culture ID Panel (Reflexed)      Status: Abnormal   Collection Time: 10/13/15  9:15 AM  Result Value Ref Range Status   Enterococcus species NOT DETECTED NOT DETECTED Final   Vancomycin resistance NOT DETECTED NOT DETECTED Final   Listeria monocytogenes NOT DETECTED NOT DETECTED Final   Staphylococcus species DETECTED (A) NOT DETECTED Final    Comment: CRITICAL RESULT CALLED TO, READ BACK BY AND VERIFIED WITH: V BRYK,PHARMD '@[id number]'  10/15/15 MKELLY    Staphylococcus aureus NOT DETECTED NOT DETECTED Final   Methicillin resistance DETECTED (A) NOT DETECTED Final    Comment: CRITICAL RESULT CALLED TO, READ BACK BY AND  VERIFIED WITH: V BRYK,PHARMD '@[id number]'  10/15/15 MKELLY    Streptococcus species NOT DETECTED NOT DETECTED Final   Streptococcus agalactiae NOT DETECTED NOT DETECTED Final   Streptococcus pneumoniae NOT DETECTED NOT DETECTED Final   Streptococcus pyogenes NOT DETECTED NOT DETECTED Final   Acinetobacter baumannii NOT DETECTED NOT DETECTED Final   Enterobacteriaceae species NOT DETECTED NOT DETECTED Final   Enterobacter cloacae complex NOT DETECTED NOT DETECTED Final   Escherichia coli NOT DETECTED NOT DETECTED Final   Klebsiella oxytoca NOT DETECTED NOT DETECTED Final   Klebsiella pneumoniae NOT DETECTED NOT DETECTED Final   Proteus species NOT DETECTED NOT DETECTED Final   Serratia marcescens NOT DETECTED NOT DETECTED Final   Carbapenem resistance NOT DETECTED NOT DETECTED Final   Haemophilus influenzae NOT DETECTED NOT DETECTED Final   Neisseria meningitidis NOT DETECTED NOT DETECTED Final   Pseudomonas aeruginosa NOT DETECTED NOT DETECTED Final   Candida albicans NOT DETECTED NOT DETECTED Final   Candida glabrata NOT DETECTED NOT DETECTED Final   Candida krusei NOT DETECTED NOT DETECTED Final   Candida parapsilosis NOT DETECTED NOT DETECTED Final   Candida tropicalis NOT DETECTED NOT DETECTED Final  Culture, Urine     Status: Abnormal   Collection Time: 10/13/15  5:19 PM  Result Value Ref Range Status    Specimen Description URINE, CATHETERIZED  Final   Special Requests NONE  Final   Culture >=100,000 COLONIES/mL YEAST (A)  Final   Report Status 10/14/2015 FINAL  Final  Culture, blood (routine x 2)     Status: None (Preliminary result)   Collection Time: 10/16/15  3:52 PM  Result Value Ref Range Status   Specimen Description BLOOD RIGHT HAND  Final   Special Requests IN PEDIATRIC BOTTLE 3CC  Final   Culture NO GROWTH 1 DAY  Final   Report Status PENDING  Incomplete  Culture, blood (routine x 2)     Status: None (Preliminary result)   Collection Time: 10/16/15  3:57 PM  Result Value Ref Range Status   Specimen Description BLOOD RIGHT HAND  Final   Special Requests IN PEDIATRIC BOTTLE 3CC  Final   Culture NO GROWTH 1 DAY  Final   Report Status PENDING  Incomplete         Radiology Studies: No results found.      Scheduled Meds: . allopurinol  100 mg Oral Daily  . antiseptic oral rinse  7 mL Mouth Rinse q12n4p  . aspirin EC  81 mg Oral Daily  . brimonidine  1 drop Both Eyes BID  . buPROPion  150 mg Oral Daily  . chlorhexidine  15 mL Mouth Rinse BID  . cholecalciferol  1,000 Units Oral BID  . fluconazole (DIFLUCAN) IV  100 mg Intravenous Q24H  . hydrALAZINE  25 mg Oral Q6H  . isosorbide mononitrate  60 mg Oral Daily  . levothyroxine  137 mcg Oral QAC breakfast  . metoprolol  10 mg Intravenous Q4H  . mometasone-formoterol  2 puff Inhalation BID  . pantoprazole  40 mg Oral BID  . polyethylene glycol  17 g Oral Daily  . risperiDONE  0.25 mg Oral BID  . timolol  1 drop Both Eyes BID  . warfarin  1 mg Oral ONCE-1800  . Warfarin - Pharmacist Dosing Inpatient   Does not apply q1800   Continuous Infusions: . sodium chloride 10 mL/hr at 10/17/15 0626     LOS: 12 days    Time spent: 40 minutes    WOODS, CURTIS J,  MD [facility] Pager [phone]   If 7PM-7AM, please contact night-coverage [email or web address] Password [facility] 10/17/2015, 6:28 PM

## 2015-10-17 NOTE — Progress Notes (Signed)
10/17/2015 Patient had a bladder scan was done at 1150 and patient had 357cc in bladder. Rn did a In and Out cath at 1208 and patient had 350cc of amber urine. [facility].

## 2015-10-17 NOTE — Progress Notes (Signed)
ANTICOAGULATION CONSULT NOTE - Follow Up Consult  Pharmacy Consult for Coumadin Indication: atrial fibrillation   Allergies  Allergen Reactions  . Amlodipine     edema  . Atorvastatin Other (See Comments)    Muscle aches  . Colesevelam Other (See Comments)    unknown  . Coumadin [Warfarin Sodium]     "causes cramps" reaction to brand name only per pt  . Lasix [Furosemide] Other (See Comments)    Muscle cramps  . Statins Other (See Comments)    Muscle aches  . Tape Rash    Patient Measurements: Height: 5\' 6"  (167.6 cm) Weight: 182 lb 15.7 oz (83 kg) IBW/kg (Calculated) : 59.3 Heparin Dosing Weight:   Vital Signs: Temp: 98.8 F (37.1 C) (05/25 0800) Temp Source: Axillary (05/25 0800) BP: 172/49 mmHg (05/25 0800) Pulse Rate: 59 (05/25 0800)  Labs:  Recent Labs  10/15/15 0414 10/17/15 0510  HGB 9.3* 9.2*  HCT 34.2* 33.8*  PLT 297 337  LABPROT  --  21.4*  INR  --  1.86*  CREATININE 2.35* 2.85*    Estimated Creatinine Clearance: 18 mL/min (by C-G formula based on Cr of 2.85).   Medications:  Scheduled:  . allopurinol  100 mg Oral Daily  . antiseptic oral rinse  7 mL Mouth Rinse q12n4p  . aspirin EC  81 mg Oral Daily  . brimonidine  1 drop Both Eyes BID  . buPROPion  150 mg Oral Daily  . chlorhexidine  15 mL Mouth Rinse BID  . cholecalciferol  1,000 Units Oral BID  . fluconazole (DIFLUCAN) IV  200 mg Intravenous Q24H  . hydrALAZINE  25 mg Oral Q6H  . isosorbide mononitrate  60 mg Oral Daily  . levothyroxine  137 mcg Oral QAC breakfast  . metoprolol  10 mg Intravenous Q4H  . mometasone-formoterol  2 puff Inhalation BID  . pantoprazole  40 mg Oral BID  . polyethylene glycol  17 g Oral Daily  . risperiDONE  0.25 mg Oral BID  . timolol  1 drop Both Eyes BID  . Warfarin - Pharmacist Dosing Inpatient   Does not apply q1800    Assessment: 80yo female with AFib, and hx DVT 2010.  INR up to 1.85 today after first dose of Coumadin last PM, had been trending  up prior to Coumadin resumption.  Hg is stable and pltc wnl.  Noted pt on Fluconazole, which may increase Coumadin effect.  This was added for (+)Yeast > 100K in urine.  Will adjust dosing for renal function (CrCl 15-20)  Goal of Therapy:  INR 2-3 Monitor platelets by anticoagulation protocol: Yes   Plan:  Coumadin 1mg  Daily INR Watch for s/s of bleeding Change Fluconazole to 100mg  q24  Gracy Bruins, PharmD Clinical Pharmacist [facility]

## 2015-10-17 NOTE — Progress Notes (Signed)
Placed patient on Bipap for the night with IPAP set at 16cm and EPAP set at 8cm. Oxygen set at 3lpm with Sp02=99%

## 2015-10-18 LAB — CULTURE, BLOOD (ROUTINE X 2): CULTURE: NO GROWTH

## 2015-10-18 LAB — PROTIME-INR
INR: 2.08 — AB (ref 0.00–1.49)
PROTHROMBIN TIME: 23.2 s — AB (ref 11.6–15.2)

## 2015-10-18 LAB — C-REACTIVE PROTEIN: CRP: 10.3 mg/dL — ABNORMAL HIGH (ref <1.0)

## 2015-10-18 LAB — SEDIMENTATION RATE: Sed Rate: 35 mm/hr — ABNORMAL HIGH (ref 0–22)

## 2015-10-18 MED ORDER — FLUCONAZOLE 100 MG PO TABS
100.0000 mg | ORAL_TABLET | Freq: Every day | ORAL | Status: DC
  Administered 2015-10-19 – 2015-10-26 (×8): 100 mg via ORAL
  Filled 2015-10-18 (×8): qty 1

## 2015-10-18 MED ORDER — QUETIAPINE 12.5 MG HALF TABLET
12.5000 mg | ORAL_TABLET | Freq: Every day | ORAL | Status: DC
  Administered 2015-10-18 – 2015-10-29 (×12): 12.5 mg via ORAL
  Filled 2015-10-18 (×13): qty 1

## 2015-10-18 MED ORDER — WARFARIN SODIUM 1 MG PO TABS
1.0000 mg | ORAL_TABLET | Freq: Once | ORAL | Status: AC
  Administered 2015-10-18: 1 mg via ORAL
  Filled 2015-10-18: qty 1

## 2015-10-18 NOTE — Progress Notes (Signed)
Pt transferred to [place] per bed by 2 nurse techs per air bed room 22

## 2015-10-18 NOTE — Progress Notes (Signed)
Report to [place] spoke to [employer]. Pt will transfer per airbed Son and husband aware of transfer

## 2015-10-18 NOTE — Progress Notes (Signed)
ANTICOAGULATION CONSULT NOTE - Follow Up Consult  Pharmacy Consult for Coumadin Indication: atrial fibrillation  Allergies  Allergen Reactions  . Amlodipine     edema  . Atorvastatin Other (See Comments)    Muscle aches  . Colesevelam Other (See Comments)    unknown  . Coumadin [Warfarin Sodium]     "causes cramps" reaction to brand name only per pt  . Lasix [Furosemide] Other (See Comments)    Muscle cramps  . Statins Other (See Comments)    Muscle aches  . Tape Rash    Patient Measurements: Height: 5\' 6"  (167.6 cm) Weight: 182 lb 15.7 oz (83 kg) IBW/kg (Calculated) : 59.3   Vital Signs: Temp: 99 F (37.2 C) (05/26 0348) Temp Source: Axillary (05/26 0348) BP: 159/64 mmHg (05/26 0348) Pulse Rate: 66 (05/26 0348)  Labs:  Recent Labs  10/17/15 0510 10/18/15 0600  HGB 9.2*  --   HCT 33.8*  --   PLT 337  --   LABPROT 21.4* 23.2*  INR 1.86* 2.08*  CREATININE 2.85*  --     Estimated Creatinine Clearance: 18 mL/min (by C-G formula based on Cr of 2.85).   Medications:  Scheduled:  . allopurinol  100 mg Oral Daily  . antiseptic oral rinse  7 mL Mouth Rinse q12n4p  . aspirin EC  81 mg Oral Daily  . brimonidine  1 drop Both Eyes BID  . buPROPion  150 mg Oral Daily  . chlorhexidine  15 mL Mouth Rinse BID  . cholecalciferol  1,000 Units Oral BID  . fluconazole (DIFLUCAN) IV  100 mg Intravenous Q24H  . hydrALAZINE  25 mg Oral Q6H  . isosorbide mononitrate  60 mg Oral Daily  . levothyroxine  137 mcg Oral QAC breakfast  . metoprolol  10 mg Intravenous Q4H  . mometasone-formoterol  2 puff Inhalation BID  . pantoprazole  40 mg Oral BID  . polyethylene glycol  17 g Oral Daily  . risperiDONE  0.25 mg Oral BID  . timolol  1 drop Both Eyes BID  . Warfarin - Pharmacist Dosing Inpatient   Does not apply q1800    Assessment: 80yo female with AFib, and hx DVT 2010. INR therapeutic after 2 doses of Coumadin.  INR had been trending up prior to resuming Coumadin. Hg is  stable and pltc wnl.  Pt on Fluconazole, which may increase Coumadin effect.   Goal of Therapy:  INR 2-3 Monitor platelets by anticoagulation protocol: Yes   Plan:  Coumadin 1mg  Daily INR Watch for s/s of bleeding  Gracy Bruins, PharmD [facility]

## 2015-10-18 NOTE — Consult Note (Signed)
   [facility] CM Inpatient Consult   10/18/2015  Wendy Cole [birth year] [id number]   Patient screened for potential [facility] Care Management services. Chart reviewed. Noted discharge plan is for SNF.  There are no identifiable [facility] Care Management needs at this time. If patient's post hospital needs change, please place a [facility] Care Management consult. For questions please contact:  Marthenia Rolling, [place], RN,BSN [facility] Liaison [phone]

## 2015-10-18 NOTE — Progress Notes (Signed)
Physical Therapy Treatment Patient Details Name: Wendy Cole DRAFT MRN: [id number] DOB: Mar 18, 1936 Today's Date: 10/18/2015    History of Present Illness This 80 y.o. female was brought to ED when she was unable to get up from the toilet.  In ED pt was grossly volume overloaded and had Guiac + stool.  Hgb 5.8 and INR 5.8.  Dx: UTI, diastolic HF exacerbation, GI bleed, OSA, paroxysmal A-Fib, toxic metabolic encephalopathy w/ increasing confusion likely UTI contributing along w/ steroid induced delirium.[name]  PMH includes:  Anxiety/depression, A-Fib, chronic severe diastolic CHF, HTN, stage III CKD, h/o DVT, OSA, Breast CA, s/p Rt mastectomy.     PT Comments    Patient remains very weak and somewhat confused. She has decr attention and at times forgets what she is trying to do before she can get started. With verbal cues and facilitation to initiate tasks, she will then begin to complete task/movement.    Follow Up Recommendations  SNF;Supervision/Assistance - 24 hour     Equipment Recommendations  Other (comment) (TBD at next venue of care)    Recommendations for Other Services       Precautions / Restrictions Precautions Precautions: Fall Restrictions Weight Bearing Restrictions: No    Mobility  Bed Mobility Overal bed mobility: Needs Assistance;+2 for physical assistance;+ 2 for safety/equipment Bed Mobility: Supine to Sit     Supine to sit: +2 for physical assistance;HOB elevated;Total assist     General bed mobility comments: Assist to elevate trunk and managing Bil LEs.  With cues pt reaches out for support.  Use of bed pad for positioning.  Transfers Overall transfer level: Needs assistance Equipment used: 2 person hand held assist Transfers: Stand Pivot Transfers   Stand pivot transfers: Max assist;+2 physical assistance;+2 safety/equipment;From elevated surface       General transfer comment: bearing weight on RLE more than LLE; assist with all aspects of  transfer  Ambulation/Gait             General Gait Details: unable to attempt   Stairs            Wheelchair Mobility    Modified Rankin (Stroke Patients Only)       Balance   Sitting-balance support: No upper extremity supported;Feet supported Sitting balance-Leahy Scale: Poor Sitting balance - Comments: up to min assist, primarily closeguard for 4 minutes     Standing balance-Leahy Scale: Zero Standing balance comment: unable to achieve full standing                     Cognition Arousal/Alertness: Lethargic Behavior During Therapy:  (irritable) Overall Cognitive Status: Impaired/Different from baseline Area of Impairment: Orientation;Attention;Memory;Safety/judgement;Problem solving;Following commands;Awareness Orientation Level: Disoriented to;Situation;Time;Place Current Attention Level: Focused Memory: Decreased short-term memory Following Commands: Follows one step commands inconsistently Safety/Judgement: Decreased awareness of safety;Decreased awareness of deficits   Problem Solving: Difficulty sequencing;Requires verbal cues;Slow processing;Decreased initiation;Requires tactile cues General Comments: turns head "what?" when name called, but very limited attention span; initially resists some movements, then assists after initiated    Exercises      General Comments General comments (skin integrity, edema, etc.): confused, husband not present and pt asking for him. Once movements initiated, she would assist however she is very weak      Pertinent Vitals/Pain Pain Assessment: Faces Faces Pain Scale: Hurts even more Pain Location: "everything" Pain Descriptors / Indicators: Grimacing Pain Intervention(s): Limited activity within patient's tolerance;Monitored during session;Repositioned    Home Living  Prior Function            PT Goals (current goals can now be found in the care plan section) Acute  Rehab PT Goals Patient Stated Goal: none stated; confused Time For Goal Achievement: 10/23/15 Progress towards PT goals: Progressing toward goals (very slowly; AMS limiting)    Frequency  Min 2X/week    PT Plan Current plan remains appropriate    Co-evaluation             End of Session Equipment Utilized During Treatment: Gait belt;Oxygen Activity Tolerance: Patient limited by fatigue;Patient limited by lethargy;Patient limited by pain Patient left: in chair;with call bell/phone within reach;with chair alarm set     Time: [id number] PT Time Calculation (min) (ACUTE ONLY): 26 min  Charges:  $Therapeutic Activity: 23-37 mins                    G Codes:      Yonathan Perrow 2015/10/24, 11:56 AM  Pager [phone]

## 2015-10-18 NOTE — Progress Notes (Signed)
Pt family would like [facility] SNF if pt is agreeable- pt still somewhat confused- CSW will continue to follow and assist when pt becomes clearer  Domenica Reamer, [place] Worker [phone]

## 2015-10-18 NOTE — Progress Notes (Signed)
Just arrived to [place] from [facility]

## 2015-10-18 NOTE — Progress Notes (Signed)
[place] TEAM 1 - Stepdown/ICU TEAM  LULLA PARTSCH  [id number] DOB: 07-Apr-1936 DOA: 10/05/2015 PCP: Walker Kehr, MD    Brief Narrative:  80yo F HxAnxiety, Depression, CVA, CAD, Paroxysmal Atrial Fibrillation on Warfarin, Chronic Severe Diastolic CHF, HTN, Stage III CKD, Hypothyroid, HLD, DVT, OSA, and Breast Cancer S/P R Mastectomy who presented to the ED complaining of B LE swelling and shortness of breath, with increasing weakness since DC from hospital on 07-30-15 due to a syncopal episode. She called EMS after she was unable to get off the toilet.   In the ED the patient was grossly volume overloaded on exam, and guiac positive w/ brown stool.  She was found to have a Hgb of 5.8, and an INR of 5.8  Assessment & Plan:  Obtundation - Toxic metabolic encephalopathy - Agitation - Psychosis  B12, folate, and ammonia recently normal - UTI likely contributing but has now been tx for an extended period of time - mental status has been slow to normalize - Psych has evaluated and suggests this may be steroid induced delirium - now off steroids - mental status still not improved to a significant extent - transfer our of SDU to combat ICU delirium - low dose QHS seroquel   Staph agalactiae UTI  Has completed a course of rocephin - no sx of ongoing infection   Possible Yeast UTI In setting of altered mental status pt is being tx to determine if it will improve her encephalopathy  GI bleed no evidence of large volume gross blood loss - stool was brown but guiac + at admission, at same time INR was supratherapeutic - empiric PPI - discussed w/ husband and pt at length - pt strongly desired resuming coumadin, but did not desire GI eval - I agree she is not in a stable enough state to do well w/ colo or EGD - resumef wafarin w/o overlap   Acute blood loss anemia  Hgb 7.9 07/27/15 - now s/p 3U PRBC - stable at this time w/o evidence of ongoing blood loss    Recent Labs Lab 10/13/15 0443  10/15/15 0414 10/17/15 0510  HGB 10.4* 9.3* 9.2*    Supratherapeutic INR corrected w/ vitamin K - INR normalized   Recent Labs Lab 10/12/15 0437 10/13/15 0443 10/14/15 0540 10/17/15 0510 10/18/15 0600  INR 1.31 1.26 1.40 1.86* 2.08*    Acute exacerbation of Chronic grade 2 diastolic CHF baseline weight is 93kg (07/27/15) - net negative ~24.6L since admit - no evidence of volume overload at this time   [facility] : [facility] Weights   10/16/15 0148 10/17/15 0423 10/18/15 0730  Weight: 81.647 kg (180 lb) 83 kg (182 lb 15.7 oz) 84 kg (185 lb 3 oz)    CKD stage III crt 2.33 at time of d/c 07/27/15 - f/u in AM  Recent Labs Lab 10/12/15 0437 10/13/15 0443 10/14/15 0540 10/15/15 0414 10/17/15 0510  CREATININE 2.30* 2.57* 2.75* 2.35* 2.85*    Paroxysmal Atrial fibrillation currently in NSR - anticoag was on hold due to possible GIB - after lengthy discussion w/ patient (?competence) and husband, we decided to resume warfarin w/o heparin overlap, and follow for recurrence of bleeding w/o GI eval   Coag neg Staph in 1 of 2 blood cx - ?bacteremia  ?simple contaminant - f/u blood cx negative so far   OSA CPAP QHS and when napping during day   DM 2  CBG controlled   HLD  Hypothyroidism continue Synthroid   Remote hx of  DVT most recent duplex in 2014 was negative for DVT - recheck this admit w/o DVT    Anxiety & Depression  Morbid obesity   Disposition  The patient's husband makes it very clear that he does not feel he can care for her at home - I agree that SNF would be most appropriate for this patient - she is presently entirely unwilling to even consider it but I am not convinced she is mentally competent at this time - we will continue to work with PT/OT and follow her mental state but a formal competency evaluation may be required prior to her ultimate disposition  DVT prophylaxis: warfarin Code Status: NO CODE  Family Communication: spoke w/ husband and son at bedside     Disposition Plan: transfer to med unit - begin seroqeul for delirium - cont to work w/ PT/OT - d/c to SNF for rehab once mental status improved   Consultants:  CHF Team [facility]   Procedures:  PICC 5/13 B LE venous duplex - 5/16 - negative for DVT   Antimicrobials:  Ceftriaxone 5/14 > 5/19 Diflucan 5/23 >  Subjective: Sedate - does not respond to questions.  Does not appear to be uncomfortable.    Objective: Blood pressure 144/54, pulse 65, temperature 98.1 F (36.7 C), temperature source Oral, resp. rate 12, height 5\' 6"  (1.676 m), weight 84 kg (185 lb 3 oz), SpO2 100 %.  Intake/Output Summary (Last 24 hours) at 10/18/15 1559 Last data filed at 10/18/15 1500  Gross per 24 hour  Intake    180 ml  Output      0 ml  Net    180 ml   Filed Weights   10/16/15 0148 10/17/15 0423 10/18/15 0730  Weight: 81.647 kg (180 lb) 83 kg (182 lb 15.7 oz) 84 kg (185 lb 3 oz)    Examination: General: No acute resp distress Lungs: distant breath sounds throughout all fields - no wheeze or focal crackles Cardiovascular: Regular rate and rhythm without murmur  Abdomen: Obese, nondistended, soft, bowel sounds positive Extremities: No significant cyanosis, clubbing, edema bilateral lower extremities   CBC:  Recent Labs Lab 10/13/15 0443 10/15/15 0414 10/17/15 0510  WBC 12.5* 11.0* 7.7  NEUTROABS 10.4*  --   --   HGB 10.4* 9.3* 9.2*  HCT 37.8 34.2* 33.8*  MCV 77.8* 78.6 79.0  PLT 284 297 [id number]   Basic Metabolic Panel:  Recent Labs Lab 10/12/15 0437 10/13/15 0443 10/14/15 0540 10/15/15 0414 10/17/15 0510  NA 144 143 145 141 140  K 4.2 4.0 3.7 3.5 3.5  CL 96* 93* 99* 96* 95*  CO2 33* 32 31 32 31  GLUCOSE 95 102* 97 112* 100*  BUN 51* 53* 65* 64* 66*  CREATININE 2.30* 2.57* 2.75* 2.35* 2.85*  CALCIUM 8.8* 9.2 8.9 8.7* 8.8*  MG 2.0 2.0 2.0  --   --    GFR: Estimated Creatinine Clearance: 18.1 mL/min (by C-G formula based on Cr of 2.85).  Liver Function Tests:  Recent  Labs Lab 10/15/15 0414 10/17/15 0510  AST 26 35  ALT 16 21  ALKPHOS 88 107  BILITOT 2.9* 2.4*  PROT 5.7* 6.0*  ALBUMIN 2.5* 2.5*   Coagulation Profile:  Recent Labs Lab 10/12/15 0437 10/13/15 0443 10/14/15 0540 10/17/15 0510 10/18/15 0600  INR 1.31 1.26 1.40 1.86* 2.08*   CBG:  Recent Labs Lab 10/12/15 0727  GLUCAP 89    Recent Results (from the past 240 hour(s))  Culture, blood (routine x  2)     Status: None   Collection Time: 10/13/15  9:08 AM  Result Value Ref Range Status   Specimen Description BLOOD LEFT ARM  Final   Special Requests BOTTLES DRAWN AEROBIC ONLY 5CC  Final   Culture NO GROWTH 5 DAYS  Final   Report Status 10/18/2015 FINAL  Final  Culture, blood (routine x 2)     Status: Abnormal   Collection Time: 10/13/15  9:15 AM  Result Value Ref Range Status   Specimen Description BLOOD RIGHT HAND  Final   Special Requests BOTTLES DRAWN AEROBIC ONLY 10CC  Final   Culture  Setup Time   Final    GRAM POSITIVE COCCI IN CLUSTERS AEROBIC BOTTLE ONLY CRITICAL RESULT CALLED TO, READ BACK BY AND VERIFIED WITH: V BRYK,PHARMD @0646  09/2315 MKELLY    Culture (A)  Final    STAPHYLOCOCCUS SPECIES (COAGULASE NEGATIVE) THE SIGNIFICANCE OF ISOLATING THIS ORGANISM FROM A SINGLE SET OF BLOOD CULTURES WHEN MULTIPLE SETS ARE DRAWN IS UNCERTAIN. PLEASE NOTIFY THE MICROBIOLOGY DEPARTMENT WITHIN ONE WEEK IF SPECIATION AND SENSITIVITIES ARE REQUIRED.    Report Status 10/17/2015 FINAL  Final  Blood Culture ID Panel (Reflexed)     Status: Abnormal   Collection Time: 10/13/15  9:15 AM  Result Value Ref Range Status   Enterococcus species NOT DETECTED NOT DETECTED Final   Vancomycin resistance NOT DETECTED NOT DETECTED Final   Listeria monocytogenes NOT DETECTED NOT DETECTED Final   Staphylococcus species DETECTED (A) NOT DETECTED Final    Comment: CRITICAL RESULT CALLED TO, READ BACK BY AND VERIFIED WITH: V BRYK,PHARMD @0646  10/15/15 MKELLY    Staphylococcus aureus NOT  DETECTED NOT DETECTED Final   Methicillin resistance DETECTED (A) NOT DETECTED Final    Comment: CRITICAL RESULT CALLED TO, READ BACK BY AND VERIFIED WITH: V BRYK,PHARMD @0646  10/15/15 MKELLY    Streptococcus species NOT DETECTED NOT DETECTED Final   Streptococcus agalactiae NOT DETECTED NOT DETECTED Final   Streptococcus pneumoniae NOT DETECTED NOT DETECTED Final   Streptococcus pyogenes NOT DETECTED NOT DETECTED Final   Acinetobacter baumannii NOT DETECTED NOT DETECTED Final   Enterobacteriaceae species NOT DETECTED NOT DETECTED Final   Enterobacter cloacae complex NOT DETECTED NOT DETECTED Final   Escherichia coli NOT DETECTED NOT DETECTED Final   Klebsiella oxytoca NOT DETECTED NOT DETECTED Final   Klebsiella pneumoniae NOT DETECTED NOT DETECTED Final   Proteus species NOT DETECTED NOT DETECTED Final   Serratia marcescens NOT DETECTED NOT DETECTED Final   Carbapenem resistance NOT DETECTED NOT DETECTED Final   Haemophilus influenzae NOT DETECTED NOT DETECTED Final   Neisseria meningitidis NOT DETECTED NOT DETECTED Final   Pseudomonas aeruginosa NOT DETECTED NOT DETECTED Final   Candida albicans NOT DETECTED NOT DETECTED Final   Candida glabrata NOT DETECTED NOT DETECTED Final   Candida krusei NOT DETECTED NOT DETECTED Final   Candida parapsilosis NOT DETECTED NOT DETECTED Final   Candida tropicalis NOT DETECTED NOT DETECTED Final  Culture, Urine     Status: Abnormal   Collection Time: 10/13/15  5:19 PM  Result Value Ref Range Status   Specimen Description URINE, CATHETERIZED  Final   Special Requests NONE  Final   Culture >=100,000 COLONIES/mL YEAST (A)  Final   Report Status 10/14/2015 FINAL  Final  Culture, blood (routine x 2)     Status: None (Preliminary result)   Collection Time: 10/16/15  3:52 PM  Result Value Ref Range Status   Specimen Description BLOOD RIGHT HAND  Final  Special Requests IN PEDIATRIC BOTTLE 3CC  Final   Culture NO GROWTH 2 DAYS  Final   Report  Status PENDING  Incomplete  Culture, blood (routine x 2)     Status: None (Preliminary result)   Collection Time: 10/16/15  3:57 PM  Result Value Ref Range Status   Specimen Description BLOOD RIGHT HAND  Final   Special Requests IN PEDIATRIC BOTTLE 3CC  Final   Culture NO GROWTH 2 DAYS  Final   Report Status PENDING  Incomplete     Scheduled Meds: . allopurinol  100 mg Oral Daily  . antiseptic oral rinse  7 mL Mouth Rinse q12n4p  . aspirin EC  81 mg Oral Daily  . brimonidine  1 drop Both Eyes BID  . buPROPion  150 mg Oral Daily  . chlorhexidine  15 mL Mouth Rinse BID  . cholecalciferol  1,000 Units Oral BID  . fluconazole (DIFLUCAN) IV  100 mg Intravenous Q24H  . hydrALAZINE  25 mg Oral Q6H  . isosorbide mononitrate  60 mg Oral Daily  . levothyroxine  137 mcg Oral QAC breakfast  . metoprolol  10 mg Intravenous Q4H  . mometasone-formoterol  2 puff Inhalation BID  . pantoprazole  40 mg Oral BID  . polyethylene glycol  17 g Oral Daily  . risperiDONE  0.25 mg Oral BID  . timolol  1 drop Both Eyes BID  . warfarin  1 mg Oral ONCE-1800  . Warfarin - Pharmacist Dosing Inpatient   Does not apply q1800   Continuous Infusions: . sodium chloride 10 mL/hr at 10/17/15 0626     LOS: 13 days   Time spent: 35 minutes   Cherene Altes, MD [facility] Office  [phone] Pager - Text Page per Amion as per below:  On-Call/Text Page:      [email or web address]      password [email or web address]  If 7PM-7AM, please contact night-coverage [email or web address] Password [email or web address] 10/18/2015, 3:59 PM

## 2015-10-18 NOTE — Progress Notes (Signed)
Pt refusing CPAP at this time. I told her to call if she changes her mind

## 2015-10-19 LAB — RHEUMATOID FACTOR

## 2015-10-19 LAB — COMPREHENSIVE METABOLIC PANEL
ALBUMIN: 2.5 g/dL — AB (ref 3.5–5.0)
ALT: 22 U/L (ref 14–54)
AST: 33 U/L (ref 15–41)
Alkaline Phosphatase: 116 U/L (ref 38–126)
Anion gap: 10 (ref 5–15)
BUN: 54 mg/dL — ABNORMAL HIGH (ref 6–20)
CALCIUM: 8.8 mg/dL — AB (ref 8.9–10.3)
CHLORIDE: 100 mmol/L — AB (ref 101–111)
CO2: 30 mmol/L (ref 22–32)
CREATININE: 2.56 mg/dL — AB (ref 0.44–1.00)
GFR calc Af Amer: 20 mL/min — ABNORMAL LOW (ref >60)
GFR calc non Af Amer: 17 mL/min — ABNORMAL LOW (ref >60)
GLUCOSE: 104 mg/dL — AB (ref 65–99)
Potassium: 3.4 mmol/L — ABNORMAL LOW (ref 3.5–5.1)
SODIUM: 140 mmol/L (ref 135–145)
TOTAL PROTEIN: 5.6 g/dL — AB (ref 6.5–8.1)
Total Bilirubin: 1.6 mg/dL — ABNORMAL HIGH (ref 0.3–1.2)

## 2015-10-19 LAB — CBC
HCT: 31.8 % — ABNORMAL LOW (ref 36.0–46.0)
Hemoglobin: 8.6 g/dL — ABNORMAL LOW (ref 12.0–15.0)
MCH: 21.7 pg — ABNORMAL LOW (ref 26.0–34.0)
MCHC: 27 g/dL — ABNORMAL LOW (ref 30.0–36.0)
MCV: 80.1 fL (ref 78.0–100.0)
PLATELETS: 364 10*3/uL (ref 150–400)
RBC: 3.97 MIL/uL (ref 3.87–5.11)
RDW: 24 % — ABNORMAL HIGH (ref 11.5–15.5)
WBC: 6.8 10*3/uL (ref 4.0–10.5)

## 2015-10-19 LAB — PROTIME-INR
INR: 2.58 — AB (ref 0.00–1.49)
PROTHROMBIN TIME: 27.3 s — AB (ref 11.6–15.2)

## 2015-10-19 MED ORDER — ALTEPLASE 2 MG IJ SOLR
2.0000 mg | Freq: Once | INTRAMUSCULAR | Status: AC
  Administered 2015-10-19: 2 mg
  Filled 2015-10-19: qty 2

## 2015-10-19 MED ORDER — METOPROLOL TARTRATE 50 MG PO TABS
50.0000 mg | ORAL_TABLET | Freq: Two times a day (BID) | ORAL | Status: DC
  Administered 2015-10-19 – 2015-10-30 (×21): 50 mg via ORAL
  Filled 2015-10-19 (×22): qty 1

## 2015-10-19 MED ORDER — TORSEMIDE 20 MG PO TABS
20.0000 mg | ORAL_TABLET | Freq: Every day | ORAL | Status: DC
  Administered 2015-10-20 – 2015-10-22 (×3): 20 mg via ORAL
  Filled 2015-10-19 (×3): qty 1

## 2015-10-19 MED ORDER — METOLAZONE 2.5 MG PO TABS
2.5000 mg | ORAL_TABLET | Freq: Every day | ORAL | Status: DC
  Administered 2015-10-20 – 2015-10-22 (×3): 2.5 mg via ORAL
  Filled 2015-10-19 (×5): qty 1

## 2015-10-19 MED ORDER — WARFARIN SODIUM 1 MG PO TABS
1.0000 mg | ORAL_TABLET | Freq: Once | ORAL | Status: AC
  Administered 2015-10-19: 1 mg via ORAL
  Filled 2015-10-19 (×2): qty 1

## 2015-10-19 NOTE — Progress Notes (Signed)
ANTICOAGULATION CONSULT NOTE - Follow Up Consult  Pharmacy Consult for Coumadin Indication: atrial fibrillation  Allergies  Allergen Reactions  . Amlodipine     edema  . Atorvastatin Other (See Comments)    Muscle aches  . Colesevelam Other (See Comments)    unknown  . Coumadin [Warfarin Sodium]     "causes cramps" reaction to brand name only per pt  . Lasix [Furosemide] Other (See Comments)    Muscle cramps  . Statins Other (See Comments)    Muscle aches  . Tape Rash    Patient Measurements: Height: 5\' 6"  (167.6 cm) Weight: 190 lb (86.183 kg) (had entered weight in errror Pt. weighs 190lbs) IBW/kg (Calculated) : 59.3   Vital Signs: Temp: 98.9 F (37.2 C) (05/27 0607) Temp Source: Oral (05/27 0607) BP: 143/54 mmHg (05/27 0607) Pulse Rate: 65 (05/27 0607)  Labs:  Recent Labs  10/17/15 0510 10/18/15 0600 10/19/15 0427  HGB 9.2*  --  8.6*  HCT 33.8*  --  31.8*  PLT 337  --  364  LABPROT 21.4* 23.2* 27.3*  INR 1.86* 2.08* 2.58*  CREATININE 2.85*  --  2.56*    Estimated Creatinine Clearance: 20.4 mL/min (by C-G formula based on Cr of 2.56).   Medications:  Scheduled:  . allopurinol  100 mg Oral Daily  . antiseptic oral rinse  7 mL Mouth Rinse q12n4p  . aspirin EC  81 mg Oral Daily  . brimonidine  1 drop Both Eyes BID  . buPROPion  150 mg Oral Daily  . chlorhexidine  15 mL Mouth Rinse BID  . cholecalciferol  1,000 Units Oral BID  . fluconazole  100 mg Oral Daily  . hydrALAZINE  25 mg Oral Q6H  . isosorbide mononitrate  60 mg Oral Daily  . levothyroxine  137 mcg Oral QAC breakfast  . metoprolol  10 mg Intravenous Q4H  . mometasone-formoterol  2 puff Inhalation BID  . pantoprazole  40 mg Oral BID  . polyethylene glycol  17 g Oral Daily  . QUEtiapine  12.5 mg Oral QHS  . timolol  1 drop Both Eyes BID  . Warfarin - Pharmacist Dosing Inpatient   Does not apply q1800    Assessment: 80yo female with AFib, and hx DVT 2010. INR up to 2.58 after 3 doses of  Coumadin. Hg is stable and pltc wnl.  Pt on Fluconazole, which may increase Coumadin effect.   Goal of Therapy:  INR 2-3 Monitor platelets by anticoagulation protocol: Yes   Plan:  Coumadin 1mg  Daily INR Watch for s/s of bleeding  Albertina Parr, PharmD., BCPS Clinical Pharmacist Pager [phone]

## 2015-10-19 NOTE — Progress Notes (Signed)
[facility] TEAM 1 - Stepdown/ICU TEAM  BAILEY SPELLS  [id number] DOB: Jan 06, 1936 DOA: 10/05/2015 PCP: Walker Kehr, MD    Brief Narrative:  80yo F HxAnxiety, Depression, CVA, CAD, Paroxysmal Atrial Fibrillation on Warfarin, Chronic Severe Diastolic CHF, HTN, Stage III CKD, Hypothyroid, HLD, DVT, OSA, and Breast Cancer S/P R Mastectomy who presented to the ED complaining of B LE swelling and shortness of breath, with increasing weakness since DC from hospital on 07-30-15 due to a syncopal episode. She called EMS after she was unable to get off the toilet.   In the ED the patient was grossly volume overloaded on exam, and guiac positive w/ brown stool.  She was found to have a Hgb of 5.8, and an INR of 5.8  Assessment & Plan:  Obtundation - Toxic metabolic encephalopathy - Agitation - Psychosis  B12, folate, and ammonia recently normal - UTI likely contributing but has now been tx for an extended period of time - mental status has been slow to normalize - Psych has evaluated and suggests this may be steroid induced delirium - now off steroids - mental status still not improved to a significant extent - low dose QHS seroquel initiated 5/26   Staph agalactiae UTI  Has completed a course of rocephin - no sx of ongoing infection   Possible Yeast UTI In setting of altered mental status pt is being tx to determine if it will improve her encephalopathy   GI bleed - low grade no evidence of large volume gross blood loss - stool was brown but guiac + at admission, at same time INR was supratherapeutic - empiric PPI - discussed w/ husband and pt at length - pt strongly desired resuming coumadin, but did not desire GI eval - I agree she is not in a stable enough state to do well w/ colo or EGD - resumed wafarin w/o overlap   Acute blood loss anemia  Hgb 7.9 07/27/15 - now s/p 3U PRBC - possibly slowly drifting downward back on warfarin - recheck in AM    Recent Labs Lab 10/13/15 0443  10/15/15 0414 10/17/15 0510 10/19/15 0427  HGB 10.4* 9.3* 9.2* 8.6*    Supratherapeutic INR corrected w/ vitamin K initially - now back on warfarin but in therapeutic range   Recent Labs Lab 10/13/15 0443 10/14/15 0540 10/17/15 0510 10/18/15 0600 10/19/15 0427  INR 1.26 1.40 1.86* 2.08* 2.58*    Acute exacerbation of Chronic grade 2 diastolic CHF baseline weight is 93kg (07/27/15) - net negative ~24.6L since admit - no evidence of volume overload at this time   [facility] Weights   10/17/15 0423 10/18/15 0730 10/19/15 0607  Weight: 83 kg (182 lb 15.7 oz) 84 kg (185 lb 3 oz) 86.183 kg (190 lb)    CKD stage III crt 2.33 at time of d/c 07/27/15 - stable for now   Recent Labs Lab 10/13/15 0443 10/14/15 0540 10/15/15 0414 10/17/15 0510 10/19/15 0427  CREATININE 2.57* 2.75* 2.35* 2.85* 2.56*    Paroxysmal Atrial fibrillation currently in NSR - anticoag was on hold due to possible GIB - after lengthy discussion w/ patient (?competence) and husband, we decided to resume warfarin w/o heparin overlap, and follow for recurrence of bleeding w/o GI eval - INR now at goal   Coag neg Staph in 1 of 2 blood cx - ?bacteremia  ?simple contaminant - f/u blood cx negative thus far   OSA CPAP QHS and when napping during day, but pt has been refusing  essentially every night   DM 2? - NOT CORRECT for this patient  A1c normal - I see no evidence that she actually has true DM - she was not on meds prior to admit   Hypothyroidism continue Synthroid - TSH normal 10/05/15  Remote hx of DVT most recent duplex in 2014 was negative for DVT - recheck this admit w/o DVT    Anxiety & Depression  Obesity - Body mass index is 30.68 kg/(m^2).  Disposition  The patient's husband makes it very clear that he does not feel he can care for her at home - I agree that SNF would be most appropriate for this patient - she is presently entirely unwilling to even consider it but I am not convinced she is  mentally competent at this time - we will continue to work with PT/OT and follow her mental state but a formal competency evaluation may be required prior to her ultimate disposition  DVT prophylaxis: warfarin Code Status: NO CODE  Family Communication: no family present at time of exam today    Disposition Plan: cont to work w/ PT/OT - d/c to SNF for rehab once mental status improved   Consultants:  CHF Team [facility]   Procedures:  PICC 5/13 B LE venous duplex - 5/16 - negative for DVT   Antimicrobials:  Ceftriaxone 5/14 > 5/19 Diflucan 5/23 >  Subjective: Sleepy but awakens to voice.  Refused CPAP again last night.  Denies focal complaints.  Perhaps somewhat less confused this morning.  In no acute distress.      Objective: Blood pressure 143/54, pulse 65, temperature 98.9 F (37.2 C), temperature source Oral, resp. rate 20, height 5\' 6"  (XX123456 m), weight 86.183 kg (190 lb), SpO2 96 %.  Intake/Output Summary (Last 24 hours) at 10/19/15 0950 Last data filed at 10/18/15 1800  Gross per 24 hour  Intake    235 ml  Output      0 ml  Net    235 ml   Filed Weights   10/17/15 0423 10/18/15 0730 10/19/15 0607  Weight: 83 kg (182 lb 15.7 oz) 84 kg (185 lb 3 oz) 86.183 kg (190 lb)    Examination: General: No acute resp distress - somnolent Lungs: distant breath sounds throughout - no wheeze or focal crackles Cardiovascular: Regular rate and rhythm - no murmur  Abdomen: Obese, nondistended, soft, bowel sounds positive, no rebound  Extremities: No significant cyanosis, clubbing, or edema bilateral lower extremities   CBC:  Recent Labs Lab 10/13/15 0443 10/15/15 0414 10/17/15 0510 10/19/15 0427  WBC 12.5* 11.0* 7.7 6.8  NEUTROABS 10.4*  --   --   --   HGB 10.4* 9.3* 9.2* 8.6*  HCT 37.8 34.2* 33.8* 31.8*  MCV 77.8* 78.6 79.0 80.1  PLT 284 297 337 [id number]   Basic Metabolic Panel:  Recent Labs Lab 10/13/15 0443 10/14/15 0540 10/15/15 0414 10/17/15 0510 10/19/15 0427   NA 143 145 141 140 140  K 4.0 3.7 3.5 3.5 3.4*  CL 93* 99* 96* 95* 100*  CO2 32 31 32 31 30  GLUCOSE 102* 97 112* 100* 104*  BUN 53* 65* 64* 66* 54*  CREATININE 2.57* 2.75* 2.35* 2.85* 2.56*  CALCIUM 9.2 8.9 8.7* 8.8* 8.8*  MG 2.0 2.0  --   --   --    GFR: Estimated Creatinine Clearance: 20.4 mL/min (by C-G formula based on Cr of 2.56).  Liver Function Tests:  Recent Labs Lab 10/15/15 0414 10/17/15 0510 10/19/15  0427  AST 26 35 33  ALT 16 21 22   ALKPHOS 88 107 116  BILITOT 2.9* 2.4* 1.6*  PROT 5.7* 6.0* 5.6*  ALBUMIN 2.5* 2.5* 2.5*   Coagulation Profile:  Recent Labs Lab 10/13/15 0443 10/14/15 0540 10/17/15 0510 10/18/15 0600 10/19/15 0427  INR 1.26 1.40 1.86* 2.08* 2.58*   CBG: No results for input(s): GLUCAP in the last 168 hours.  Recent Results (from the past 240 hour(s))  Culture, blood (routine x 2)     Status: None   Collection Time: 10/13/15  9:08 AM  Result Value Ref Range Status   Specimen Description BLOOD LEFT ARM  Final   Special Requests BOTTLES DRAWN AEROBIC ONLY 5CC  Final   Culture NO GROWTH 5 DAYS  Final   Report Status 10/18/2015 FINAL  Final  Culture, blood (routine x 2)     Status: Abnormal   Collection Time: 10/13/15  9:15 AM  Result Value Ref Range Status   Specimen Description BLOOD RIGHT HAND  Final   Special Requests BOTTLES DRAWN AEROBIC ONLY 10CC  Final   Culture  Setup Time   Final    GRAM POSITIVE COCCI IN CLUSTERS AEROBIC BOTTLE ONLY CRITICAL RESULT CALLED TO, READ BACK BY AND VERIFIED WITH: V BRYK,PHARMD @0646  09/2315 MKELLY    Culture (A)  Final    STAPHYLOCOCCUS SPECIES (COAGULASE NEGATIVE) THE SIGNIFICANCE OF ISOLATING THIS ORGANISM FROM A SINGLE SET OF BLOOD CULTURES WHEN MULTIPLE SETS ARE DRAWN IS UNCERTAIN. PLEASE NOTIFY THE MICROBIOLOGY DEPARTMENT WITHIN ONE WEEK IF SPECIATION AND SENSITIVITIES ARE REQUIRED.    Report Status 10/17/2015 FINAL  Final  Blood Culture ID Panel (Reflexed)     Status: Abnormal    Collection Time: 10/13/15  9:15 AM  Result Value Ref Range Status   Enterococcus species NOT DETECTED NOT DETECTED Final   Vancomycin resistance NOT DETECTED NOT DETECTED Final   Listeria monocytogenes NOT DETECTED NOT DETECTED Final   Staphylococcus species DETECTED (A) NOT DETECTED Final    Comment: CRITICAL RESULT CALLED TO, READ BACK BY AND VERIFIED WITH: V BRYK,PHARMD @0646  10/15/15 MKELLY    Staphylococcus aureus NOT DETECTED NOT DETECTED Final   Methicillin resistance DETECTED (A) NOT DETECTED Final    Comment: CRITICAL RESULT CALLED TO, READ BACK BY AND VERIFIED WITH: V BRYK,PHARMD @0646  10/15/15 MKELLY    Streptococcus species NOT DETECTED NOT DETECTED Final   Streptococcus agalactiae NOT DETECTED NOT DETECTED Final   Streptococcus pneumoniae NOT DETECTED NOT DETECTED Final   Streptococcus pyogenes NOT DETECTED NOT DETECTED Final   Acinetobacter baumannii NOT DETECTED NOT DETECTED Final   Enterobacteriaceae species NOT DETECTED NOT DETECTED Final   Enterobacter cloacae complex NOT DETECTED NOT DETECTED Final   Escherichia coli NOT DETECTED NOT DETECTED Final   Klebsiella oxytoca NOT DETECTED NOT DETECTED Final   Klebsiella pneumoniae NOT DETECTED NOT DETECTED Final   Proteus species NOT DETECTED NOT DETECTED Final   Serratia marcescens NOT DETECTED NOT DETECTED Final   Carbapenem resistance NOT DETECTED NOT DETECTED Final   Haemophilus influenzae NOT DETECTED NOT DETECTED Final   Neisseria meningitidis NOT DETECTED NOT DETECTED Final   Pseudomonas aeruginosa NOT DETECTED NOT DETECTED Final   Candida albicans NOT DETECTED NOT DETECTED Final   Candida glabrata NOT DETECTED NOT DETECTED Final   Candida krusei NOT DETECTED NOT DETECTED Final   Candida parapsilosis NOT DETECTED NOT DETECTED Final   Candida tropicalis NOT DETECTED NOT DETECTED Final  Culture, Urine     Status: Abnormal  Collection Time: 10/13/15  5:19 PM  Result Value Ref Range Status   Specimen Description  URINE, CATHETERIZED  Final   Special Requests NONE  Final   Culture >=100,000 COLONIES/mL YEAST (A)  Final   Report Status 10/14/2015 FINAL  Final  Culture, blood (routine x 2)     Status: None (Preliminary result)   Collection Time: 10/16/15  3:52 PM  Result Value Ref Range Status   Specimen Description BLOOD RIGHT HAND  Final   Special Requests IN PEDIATRIC BOTTLE 3CC  Final   Culture NO GROWTH 2 DAYS  Final   Report Status PENDING  Incomplete  Culture, blood (routine x 2)     Status: None (Preliminary result)   Collection Time: 10/16/15  3:57 PM  Result Value Ref Range Status   Specimen Description BLOOD RIGHT HAND  Final   Special Requests IN PEDIATRIC BOTTLE 3CC  Final   Culture NO GROWTH 2 DAYS  Final   Report Status PENDING  Incomplete     Scheduled Meds: . allopurinol  100 mg Oral Daily  . alteplase  2 mg Intracatheter Once  . antiseptic oral rinse  7 mL Mouth Rinse q12n4p  . aspirin EC  81 mg Oral Daily  . brimonidine  1 drop Both Eyes BID  . buPROPion  150 mg Oral Daily  . chlorhexidine  15 mL Mouth Rinse BID  . cholecalciferol  1,000 Units Oral BID  . fluconazole  100 mg Oral Daily  . hydrALAZINE  25 mg Oral Q6H  . isosorbide mononitrate  60 mg Oral Daily  . levothyroxine  137 mcg Oral QAC breakfast  . metoprolol  10 mg Intravenous Q4H  . mometasone-formoterol  2 puff Inhalation BID  . pantoprazole  40 mg Oral BID  . polyethylene glycol  17 g Oral Daily  . QUEtiapine  12.5 mg Oral QHS  . timolol  1 drop Both Eyes BID  . warfarin  1 mg Oral ONCE-1800  . Warfarin - Pharmacist Dosing Inpatient   Does not apply q1800   Continuous Infusions: . sodium chloride 10 mL/hr at 10/17/15 0626     LOS: 14 days   Time spent: 25 minutes   Cherene Altes, MD [facility] Office  [phone] Pager - Text Page per Amion as per below:  On-Call/Text Page:      [email or web address]      password [email or web address]  If 7PM-7AM, please contact night-coverage [email or web address] Password  [email or web address] 10/19/2015, 9:50 AM

## 2015-10-19 NOTE — Progress Notes (Signed)
Pt sleeping when rt entered room Pt said she would try BIPAP, I attempted to place pt on Bipap and she began yelling "Quit it" and said she didn't want to wear the mask. Pt tolerating well on 2l [place].

## 2015-10-20 DIAGNOSIS — D62 Acute posthemorrhagic anemia: Secondary | ICD-10-CM

## 2015-10-20 DIAGNOSIS — R195 Other fecal abnormalities: Secondary | ICD-10-CM

## 2015-10-20 LAB — CBC
HCT: 33.4 % — ABNORMAL LOW (ref 36.0–46.0)
Hemoglobin: 9.2 g/dL — ABNORMAL LOW (ref 12.0–15.0)
MCH: 22.3 pg — AB (ref 26.0–34.0)
MCHC: 27.5 g/dL — ABNORMAL LOW (ref 30.0–36.0)
MCV: 81.1 fL (ref 78.0–100.0)
PLATELETS: 382 10*3/uL (ref 150–400)
RBC: 4.12 MIL/uL (ref 3.87–5.11)
RDW: 24.4 % — AB (ref 11.5–15.5)
WBC: 7.1 10*3/uL (ref 4.0–10.5)

## 2015-10-20 LAB — PROTIME-INR
INR: 2.98 — ABNORMAL HIGH (ref 0.00–1.49)
PROTHROMBIN TIME: 30.5 s — AB (ref 11.6–15.2)

## 2015-10-20 MED ORDER — WARFARIN 0.5 MG HALF TABLET
0.5000 mg | ORAL_TABLET | Freq: Once | ORAL | Status: AC
  Administered 2015-10-20: 0.5 mg via ORAL
  Filled 2015-10-20: qty 1

## 2015-10-20 NOTE — Progress Notes (Addendum)
[name], female   DOB: Aug 16, 1935, 80 y.o.   MRN: [id number]  PROGRESS NOTE    Wendy Cole  [id number] DOB: [birth year] DOA: 10/05/2015  PCP: Walker Kehr, MD   Brief Narrative:  80 year old female with past medical history significant for CVA, CAD, paroxysmal atrial fibrillation on anticoagulation with Coumadin, chronic severe diastolic congestive heart failure, chronic kidney disease stage III, hypothyroidism, dyslipidemia, DVT, obstructive sleep apnea, breast cancer status post right mastectomy. Patient presented to [facility] 10/05/2015 with bilateral lower extremity swelling and shortness of breath in addition to progressive weakness since hospital discharge back in March 2017. On admission, patient was volume overloaded with other findings of guaiac positive stool. Her hemoglobin was 5.8 and INR was 5.8.  Assessment & Plan:  Obtundation / Toxic metabolic encephalopathy / yeast UTI / Agitation / Psychosis  - Probably related to UTI, urine culture grew yeast and she is treated with fluconazole - B12, folate, and ammonia recently normal  - Patient was seen by psychiatry and their thought is that patient may have had steroid induced delirium but currently she is off of steroids and mental status still not improved - She is on Seroquel 12.5 mg at bedtime  Staph agalactiae UTI / Coagulase negative bacteremia  - Has completed a course of rocephin - no sx of ongoing infection  - Please note blood cultures with staph species coagulase negative which suggests contamination  Occult GI bleed / acute blood loss anemia in the setting of supratherapeutic INR - Per prior documentation, patient's husband did not want a GI evaluation at this time - Patient has received 3 units of PRBC transfusion during this hospital stay - She is on Coumadin and her hemoglobin is 9.2 this morning - No reports of bleeding - Off note, she was given vitamin K on admission  for supratherapeutic INR which will corrected INR  Acute exacerbation of chronic grade 2 diastolic CHF - Baseline weight 93 kg 07/27/2015  - Weight this morning is 84.3 kg - No evidence of volume overload on physical exam, no lower extremity edema and good respiratory effort - Continue torsemide 20 mg daily   Chronic kidney disease stage III  - Creatinine at 2.56 on 10/19/2015  - Check BMP tomorrow morning   Paroxysmal Atrial fibrillation - CHADS vasc score 4 - Per patient's husband, wanted to resume Coumadin so this was done and so far tolerates well  - INR 2.98 - Rate controlled with metoprolol   Essential hypertension - Continue metoprolol, imdur and hydralazine  OSA - CPAP QHS   Hypothyroidism - Continue Synthroid  - TSH normal 10/05/15  Remote hx of DVT - Most recent duplex in 2014 was negative for DVT - Recheck this admit w/o DVT   Obesity  - Body mass index is 30.68 kg/(m^2).   DVT prophylaxis: On full dose anticoagulation with Coumadin Code Status: DNR/DNI  Family Communication: No family at the bedside Disposition Plan: Anticipate discharge once mental status improves, discharge to skilled nursing facility   Consultants:   CHF Team  [facility]   Procedures:   PICC 5/13  B LE venous duplex - 5/16 - negative for DVT   Antimicrobials:  Ceftriaxone 5/14 > 5/19 Diflucan 5/23 >   Subjective: No overnight events.  Objective: Filed Vitals:   10/19/15 2119 10/20/15 0500 10/20/15 0655 10/20/15 0819  BP: 170/36  155/33   Pulse: 63  60   Temp: 98 F (36.7 C)  98.6 F (37  C)   TempSrc: Oral  Oral   Resp:   18   Height:      Weight:  84.369 kg (186 lb)    SpO2: 94%  94% 94%    Intake/Output Summary (Last 24 hours) at 10/20/15 0854 Last data filed at 10/19/15 1638  Gross per 24 hour  Intake    260 ml  Output      0 ml  Net    260 ml   Filed Weights   10/18/15 0730 10/19/15 0607 10/20/15 0500  Weight: 84 kg (185 lb 3 oz) 86.183 kg (190 lb)  84.369 kg (186 lb)    Examination:  General exam: Appears calm and comfortable  Respiratory system: Clear to auscultation. Respiratory effort normal. Cardiovascular system: S1 & S2 heard, RRR. Soft systolic murmur, 2 out of 6 auscultated to his the apex Gastrointestinal system: Abdomen is nondistended, soft and nontender. No organomegaly or masses felt. Normal bowel sounds heard. Central nervous system: Opens her eyes when called her name but she is disoriented, confused Extremities: No edema, pulses palpable Skin: No rashes, lesions or ulcers Psychiatry: Normal behavior, no agitation  Data Reviewed: I have personally reviewed following labs and imaging studies  CBC:  Recent Labs Lab 10/15/15 0414 10/17/15 0510 10/19/15 0427 10/20/15 0636  WBC 11.0* 7.7 6.8 7.1  HGB 9.3* 9.2* 8.6* 9.2*  HCT 34.2* 33.8* 31.8* 33.4*  MCV 78.6 79.0 80.1 81.1  PLT 297 337 364 [id number]   Basic Metabolic Panel:  Recent Labs Lab 10/14/15 0540 10/15/15 0414 10/17/15 0510 10/19/15 0427  NA 145 141 140 140  K 3.7 3.5 3.5 3.4*  CL 99* 96* 95* 100*  CO2 31 32 31 30  GLUCOSE 97 112* 100* 104*  BUN 65* 64* 66* 54*  CREATININE 2.75* 2.35* 2.85* 2.56*  CALCIUM 8.9 8.7* 8.8* 8.8*  MG 2.0  --   --   --    GFR: Estimated Creatinine Clearance: 20.1 mL/min (by C-G formula based on Cr of 2.56). Liver Function Tests:  Recent Labs Lab 10/15/15 0414 10/17/15 0510 10/19/15 0427  AST 26 35 33  ALT 16 21 22   ALKPHOS 88 107 116  BILITOT 2.9* 2.4* 1.6*  PROT 5.7* 6.0* 5.6*  ALBUMIN 2.5* 2.5* 2.5*   No results for input(s): LIPASE, AMYLASE in the last 168 hours. No results for input(s): AMMONIA in the last 168 hours. Coagulation Profile:  Recent Labs Lab 10/14/15 0540 10/17/15 0510 10/18/15 0600 10/19/15 0427 10/20/15 0636  INR 1.40 1.86* 2.08* 2.58* 2.98*   Cardiac Enzymes: No results for input(s): CKTOTAL, CKMB, CKMBINDEX, TROPONINI in the last 168 hours. BNP (last 3 results)  Recent  Labs  07/22/15 1503  PROBNP >5000.0*   HbA1C: No results for input(s): HGBA1C in the last 72 hours. CBG: No results for input(s): GLUCAP in the last 168 hours. Lipid Profile: No results for input(s): CHOL, HDL, LDLCALC, TRIG, CHOLHDL, LDLDIRECT in the last 72 hours. Thyroid Function Tests: No results for input(s): TSH, T4TOTAL, FREET4, T3FREE, THYROIDAB in the last 72 hours. Anemia Panel: No results for input(s): VITAMINB12, FOLATE, FERRITIN, TIBC, IRON, RETICCTPCT in the last 72 hours. Urine analysis:    Component Value Date/Time   COLORURINE YELLOW 10/05/2015 1233   APPEARANCEUR TURBID* 10/05/2015 1233   LABSPEC 1.019 10/05/2015 1233   LABSPEC 1.010 06/21/2013 1213   PHURINE 5.0 10/05/2015 1233   PHURINE 6.0 06/21/2013 1213   GLUCOSEU NEGATIVE 10/05/2015 1233   GLUCOSEU NEGATIVE 03/20/2014 1454   GLUCOSEU Negative  06/21/2013 1213   HGBUR LARGE* 10/05/2015 1233   HGBUR Negative 06/21/2013 1213   [place] 10/05/2015 1233   BILIRUBINUR Negative 06/21/2013 1213   [place] 10/05/2015 1233   KETONESUR Negative 06/21/2013 1213   PROTEINUR 100* 10/05/2015 1233   PROTEINUR 100 06/21/2013 1213   UROBILINOGEN 0.2 03/20/2014 1454   UROBILINOGEN 0.2 06/21/2013 1213   NITRITE NEGATIVE 10/05/2015 1233   NITRITE Negative 06/21/2013 1213   LEUKOCYTESUR LARGE* 10/05/2015 1233   LEUKOCYTESUR Negative 06/21/2013 1213   Sepsis Labs: @LABRCNTIP (procalcitonin:4,lacticidven:4)    Culture, blood (routine x 2)     Status: None   Collection Time: 10/13/15  9:08 AM  Result Value Ref Range Status   Specimen Description BLOOD LEFT ARM  Final   Special Requests BOTTLES DRAWN AEROBIC ONLY 5CC  Final   Culture NO GROWTH 5 DAYS  Final   Report Status 10/18/2015 FINAL  Final  Culture, blood (routine x 2)     Status: Abnormal   Collection Time: 10/13/15  9:15 AM  Result Value Ref Range Status   Specimen Description BLOOD RIGHT HAND  Final   Special Requests BOTTLES DRAWN  AEROBIC ONLY 10CC  Final   Culture  Setup Time   Final   Culture (A)  Final    STAPHYLOCOCCUS SPECIES (COAGULASE NEGATIVE)    Report Status 10/17/2015 FINAL  Final  Blood Culture ID Panel (Reflexed)     Status: Abnormal   Collection Time: 10/13/15  9:15 AM  Result Value Ref Range Status   Enterococcus species NOT DETECTED NOT DETECTED Final   Vancomycin resistance NOT DETECTED NOT DETECTED Final   Listeria monocytogenes NOT DETECTED NOT DETECTED Final   Staphylococcus species DETECTED (A) NOT DETECTED Final   Staphylococcus aureus NOT DETECTED NOT DETECTED Final   Methicillin resistance DETECTED (A) NOT DETECTED Final   Streptococcus species NOT DETECTED NOT DETECTED Final   Streptococcus agalactiae NOT DETECTED NOT DETECTED Final   Streptococcus pneumoniae NOT DETECTED NOT DETECTED Final   Streptococcus pyogenes NOT DETECTED NOT DETECTED Final   Acinetobacter baumannii NOT DETECTED NOT DETECTED Final   Enterobacteriaceae species NOT DETECTED NOT DETECTED Final   Enterobacter cloacae complex NOT DETECTED NOT DETECTED Final   Escherichia coli NOT DETECTED NOT DETECTED Final   Klebsiella oxytoca NOT DETECTED NOT DETECTED Final   Klebsiella pneumoniae NOT DETECTED NOT DETECTED Final   Proteus species NOT DETECTED NOT DETECTED Final   Serratia marcescens NOT DETECTED NOT DETECTED Final   Carbapenem resistance NOT DETECTED NOT DETECTED Final   Haemophilus influenzae NOT DETECTED NOT DETECTED Final   Neisseria meningitidis NOT DETECTED NOT DETECTED Final   Pseudomonas aeruginosa NOT DETECTED NOT DETECTED Final   Candida albicans NOT DETECTED NOT DETECTED Final   Candida glabrata NOT DETECTED NOT DETECTED Final   Candida krusei NOT DETECTED NOT DETECTED Final   Candida parapsilosis NOT DETECTED NOT DETECTED Final   Candida tropicalis NOT DETECTED NOT DETECTED Final  Culture, Urine     Status: Abnormal   Collection Time: 10/13/15  5:19 PM  Result Value Ref Range Status   Specimen  Description URINE, CATHETERIZED  Final   Special Requests NONE  Final   Culture >=100,000 COLONIES/mL YEAST (A)  Final   Report Status 10/14/2015 FINAL  Final  Culture, blood (routine x 2)     Status: None (Preliminary result)   Collection Time: 10/16/15  3:52 PM  Result Value Ref Range Status   Specimen Description BLOOD RIGHT HAND  Final   Special  Requests IN PEDIATRIC BOTTLE 3CC  Final   Culture NO GROWTH 3 DAYS  Final   Report Status PENDING  Incomplete  Culture, blood (routine x 2)     Status: None (Preliminary result)   Collection Time: 10/16/15  3:57 PM  Result Value Ref Range Status   Specimen Description BLOOD RIGHT HAND  Final   Special Requests IN PEDIATRIC BOTTLE 3CC  Final   Culture NO GROWTH 3 DAYS  Final   Report Status PENDING  Incomplete      Radiology Studies: No results found.  Scheduled Meds: . allopurinol  100 mg Oral Daily  . antiseptic oral rinse  7 mL Mouth Rinse q12n4p  . aspirin EC  81 mg Oral Daily  . brimonidine  1 drop Both Eyes BID  . buPROPion  150 mg Oral Daily  . chlorhexidine  15 mL Mouth Rinse BID  . cholecalciferol  1,000 Units Oral BID  . fluconazole  100 mg Oral Daily  . hydrALAZINE  25 mg Oral Q6H  . isosorbide mononitrate  60 mg Oral Daily  . levothyroxine  137 mcg Oral QAC breakfast  . metolazone  2.5 mg Oral Daily  . metoprolol tartrate  50 mg Oral BID  . mometasone-formoterol  2 puff Inhalation BID  . pantoprazole  40 mg Oral BID  . polyethylene glycol  17 g Oral Daily  . QUEtiapine  12.5 mg Oral QHS  . timolol  1 drop Both Eyes BID  . torsemide  20 mg Oral Daily  . warfarin  0.5 mg Oral ONCE-1800   Continuous Infusions: . sodium chloride 10 mL/hr at 10/17/15 0626     LOS: 15 days    Time spent: 25 minutes  Greater than 50% of the time spent on counseling and coordinating the care.   Wendy Lenz, MD [facility] Pager [phone]  If 7PM-7AM, please contact night-coverage [email or web address] Password  [facility] 10/20/2015, 8:54 AM

## 2015-10-20 NOTE — Progress Notes (Signed)
Pt very sleepy today but appears comfortable.

## 2015-10-20 NOTE — Progress Notes (Signed)
ANTICOAGULATION CONSULT NOTE - Follow Up Consult  Pharmacy Consult for Coumadin Indication: atrial fibrillation  Allergies  Allergen Reactions  . Amlodipine     edema  . Atorvastatin Other (See Comments)    Muscle aches  . Colesevelam Other (See Comments)    unknown  . Coumadin [Warfarin Sodium]     "causes cramps" reaction to brand name only per pt  . Lasix [Furosemide] Other (See Comments)    Muscle cramps  . Statins Other (See Comments)    Muscle aches  . Tape Rash    Patient Measurements: Height: 5\' 6"  (167.6 cm) Weight: 186 lb (84.369 kg) IBW/kg (Calculated) : 59.3   Vital Signs: Temp: 98.6 F (37 C) (05/28 0655) Temp Source: Oral (05/28 0655) BP: 155/33 mmHg (05/28 0655) Pulse Rate: 60 (05/28 0655)  Labs:  Recent Labs  10/18/15 0600 10/19/15 0427 10/20/15 0636  HGB  --  8.6* 9.2*  HCT  --  31.8* 33.4*  PLT  --  364 382  LABPROT 23.2* 27.3* 30.5*  INR 2.08* 2.58* 2.98*  CREATININE  --  2.56*  --     Estimated Creatinine Clearance: 20.1 mL/min (by C-G formula based on Cr of 2.56).   Medications:  Scheduled:  . allopurinol  100 mg Oral Daily  . antiseptic oral rinse  7 mL Mouth Rinse q12n4p  . aspirin EC  81 mg Oral Daily  . brimonidine  1 drop Both Eyes BID  . buPROPion  150 mg Oral Daily  . chlorhexidine  15 mL Mouth Rinse BID  . cholecalciferol  1,000 Units Oral BID  . fluconazole  100 mg Oral Daily  . hydrALAZINE  25 mg Oral Q6H  . isosorbide mononitrate  60 mg Oral Daily  . levothyroxine  137 mcg Oral QAC breakfast  . metolazone  2.5 mg Oral Daily  . metoprolol tartrate  50 mg Oral BID  . mometasone-formoterol  2 puff Inhalation BID  . pantoprazole  40 mg Oral BID  . polyethylene glycol  17 g Oral Daily  . QUEtiapine  12.5 mg Oral QHS  . timolol  1 drop Both Eyes BID  . torsemide  20 mg Oral Daily  . Warfarin - Pharmacist Dosing Inpatient   Does not apply q1800    Assessment: 80yo female with AFib, and hx DVT 2010. INR up to 2.98  after 3 doses of Coumadin. Hg is stable and pltc wnl.  Pt on Fluconazole, which may increase Coumadin effect.   Goal of Therapy:  INR 2-3 Monitor platelets by anticoagulation protocol: Yes   Plan:  Coumadin 0.5mg  Daily INR Watch for s/s of bleeding  Albertina Parr, PharmD., BCPS Clinical Pharmacist Pager [phone]

## 2015-10-21 LAB — CULTURE, BLOOD (ROUTINE X 2)
CULTURE: NO GROWTH
Culture: NO GROWTH

## 2015-10-21 LAB — PROTIME-INR
INR: 3.42 — AB (ref 0.00–1.49)
Prothrombin Time: 33.8 seconds — ABNORMAL HIGH (ref 11.6–15.2)

## 2015-10-21 NOTE — Progress Notes (Addendum)
[name], female   DOB: [birth year], 80 y.o.   MRN: [id number]  PROGRESS NOTE    Wendy Cole  [id number] DOB: February 12, 1936 DOA: 10/05/2015  PCP: Walker Kehr, MD   Brief Narrative:  80 year old female with past medical history significant for CVA, CAD, paroxysmal atrial fibrillation on anticoagulation with Coumadin, chronic severe diastolic congestive heart failure, chronic kidney disease stage III, hypothyroidism, dyslipidemia, DVT, obstructive sleep apnea, breast cancer status post right mastectomy. Patient presented to [facility]/05/2015 with bilateral lower extremity swelling and shortness of breath in addition to progressive weakness since hospital discharge back in March 2017. On admission, patient was volume overloaded with other findings of guaiac positive stool. Her hemoglobin was 5.8 and INR was 5.8.  Assessment & Plan:  Obtundation / Toxic metabolic encephalopathy / yeast UTI / Agitation / Psychosis  - Probably related to UTI, urine culture grew yeast and she is treated with fluconazole - B12, folate, and ammonia recently normal  - Patient was seen by psychiatry and their thought is that patient may have had steroid induced delirium but currently she is off of steroids and mental status still not improved - Continue Seroquel 12.5 mg at bedtime  Staph agalactiae UTI / Coagulase negative bacteremia  - Has completed a course of rocephin - no sx of ongoing infection  - Please note blood cultures with staph species coagulase negative which suggests contamination  Occult GI bleed / acute blood loss anemia in the setting of supratherapeutic INR - Per prior documentation, patient's husband did not want a GI evaluation at this time - Patient has received 3 units of PRBC transfusion during this hospital stay - No reports of bleeding while on Coumadin  - Off note, she was given vitamin K on admission for supratherapeutic INR which will corrected  INR  Acute exacerbation of chronic grade 2 diastolic CHF - Baseline weight 93 kg 07/27/2015  - Weight in past 48 hours: 84.3 kg --> 84.8 kg - No evidence of volume overload on physical exam - Continue torsemide 20 mg daily   Chronic kidney disease stage III  - Creatinine at 2.56 on 10/19/2015  - BMP pending this am   Paroxysmal Atrial fibrillation - CHADS vasc score 4 - Per patient's husband, wanted to resume Coumadin so this was done and so far tolerates well  - Rate controlled with metoprolol   Essential hypertension - Continue metoprolol, imdur, hydralazine   OSA - CPAP QHS   Hypothyroidism - Continue Synthroid  - TSH normal 10/05/15  Remote hx of DVT - Most recent duplex in 2014 was negative for DVT - LE doppler 5/16 - no DVT  Obesity  - Body mass index is 30.68 kg/(m^2).   DVT prophylaxis: On anticoagulation with Coumadin Code Status: DNR/DNI  Family Communication: No family at the bedside Disposition Plan: Anticipate discharge once mental status better, will f/u on PT eval    Consultants:   CHF Team  [facility]   Procedures:   PICC 5/13  B LE venous duplex - 5/16 - negative for DVT   Antimicrobials:   Ceftriaxone 5/14 > 5/19  Diflucan 5/23 >   Subjective: No overnight events. No resp distress.   Objective: Filed Vitals:   10/21/15 0016 10/21/15 0500 10/21/15 0631 10/21/15 0903  BP: 134/34  144/97   Pulse:   67 65  Temp:   99.3 F (37.4 C)   TempSrc:   Axillary   Resp:   17 17  Height:      Weight:  84.823 kg (187 lb)    SpO2:   100% 96%    Intake/Output Summary (Last 24 hours) at 10/21/15 1039 Last data filed at 10/20/15 1830  Gross per 24 hour  Intake    320 ml  Output      0 ml  Net    320 ml   Filed Weights   10/19/15 0607 10/20/15 0500 10/21/15 0500  Weight: 86.183 kg (190 lb) 84.369 kg (186 lb) 84.823 kg (187 lb)    Examination:  General exam: No distress, opens eyes to verbal stimuli Respiratory system: No wheezing,  no rhonchi  Cardiovascular system: S1 & S2 (+), Rate controlled. Soft systolic murmur, 2 out of 6 auscultated at the apex Gastrointestinal system: (+) BS, non tender  Central nervous system: Remains disoriented, confused Extremities: No swelling, palpable pulses  Skin: warm, dry  Psychiatry: No restlessness, no agitation   Data Reviewed: I have personally reviewed following labs and imaging studies  CBC:  Recent Labs Lab 10/15/15 0414 10/17/15 0510 10/19/15 0427 10/20/15 0636  WBC 11.0* 7.7 6.8 7.1  HGB 9.3* 9.2* 8.6* 9.2*  HCT 34.2* 33.8* 31.8* 33.4*  MCV 78.6 79.0 80.1 81.1  PLT 297 337 364 [id number]   Basic Metabolic Panel:  Recent Labs Lab 10/15/15 0414 10/17/15 0510 10/19/15 0427  NA 141 140 140  K 3.5 3.5 3.4*  CL 96* 95* 100*  CO2 32 31 30  GLUCOSE 112* 100* 104*  BUN 64* 66* 54*  CREATININE 2.35* 2.85* 2.56*  CALCIUM 8.7* 8.8* 8.8*   GFR: Estimated Creatinine Clearance: 20.2 mL/min (by C-G formula based on Cr of 2.56). Liver Function Tests:  Recent Labs Lab 10/15/15 0414 10/17/15 0510 10/19/15 0427  AST 26 35 33  ALT 16 21 22   ALKPHOS 88 107 116  BILITOT 2.9* 2.4* 1.6*  PROT 5.7* 6.0* 5.6*  ALBUMIN 2.5* 2.5* 2.5*   No results for input(s): LIPASE, AMYLASE in the last 168 hours. No results for input(s): AMMONIA in the last 168 hours. Coagulation Profile:  Recent Labs Lab 10/17/15 0510 10/18/15 0600 10/19/15 0427 10/20/15 0636 10/21/15 0604  INR 1.86* 2.08* 2.58* 2.98* 3.42*   Cardiac Enzymes: No results for input(s): CKTOTAL, CKMB, CKMBINDEX, TROPONINI in the last 168 hours. BNP (last 3 results)  Recent Labs  07/22/15 1503  PROBNP >5000.0*   HbA1C: No results for input(s): HGBA1C in the last 72 hours. CBG: No results for input(s): GLUCAP in the last 168 hours. Lipid Profile: No results for input(s): CHOL, HDL, LDLCALC, TRIG, CHOLHDL, LDLDIRECT in the last 72 hours. Thyroid Function Tests: No results for input(s): TSH, T4TOTAL,  FREET4, T3FREE, THYROIDAB in the last 72 hours. Anemia Panel: No results for input(s): VITAMINB12, FOLATE, FERRITIN, TIBC, IRON, RETICCTPCT in the last 72 hours. Urine analysis:    Component Value Date/Time   COLORURINE YELLOW 10/05/2015 1233   APPEARANCEUR TURBID* 10/05/2015 1233   LABSPEC 1.019 10/05/2015 1233   LABSPEC 1.010 06/21/2013 1213   PHURINE 5.0 10/05/2015 1233   PHURINE 6.0 06/21/2013 1213   GLUCOSEU NEGATIVE 10/05/2015 1233   GLUCOSEU NEGATIVE 03/20/2014 1454   GLUCOSEU Negative 06/21/2013 1213   HGBUR LARGE* 10/05/2015 1233   HGBUR Negative 06/21/2013 [place] 10/05/2015 1233   BILIRUBINUR Negative 06/21/2013 1213   [place] 10/05/2015 1233   KETONESUR Negative 06/21/2013 1213   PROTEINUR 100* 10/05/2015 1233   PROTEINUR 100 06/21/2013 1213   UROBILINOGEN 0.2 03/20/2014 1454  UROBILINOGEN 0.2 06/21/2013 1213   NITRITE NEGATIVE 10/05/2015 1233   NITRITE Negative 06/21/2013 1213   LEUKOCYTESUR LARGE* 10/05/2015 1233   LEUKOCYTESUR Negative 06/21/2013 1213   Sepsis Labs: @LABRCNTIP (procalcitonin:4,lacticidven:4)    Culture, blood (routine x 2)     Status: None   Collection Time: 10/13/15  9:08 AM  Result Value Ref Range Status   Specimen Description BLOOD LEFT ARM  Final   Special Requests BOTTLES DRAWN AEROBIC ONLY 5CC  Final   Culture NO GROWTH 5 DAYS  Final   Report Status 10/18/2015 FINAL  Final  Culture, blood (routine x 2)     Status: Abnormal   Collection Time: 10/13/15  9:15 AM  Result Value Ref Range Status   Specimen Description BLOOD RIGHT HAND  Final   Special Requests BOTTLES DRAWN AEROBIC ONLY 10CC  Final   Culture  Setup Time   Final   Culture (A)  Final    STAPHYLOCOCCUS SPECIES (COAGULASE NEGATIVE)    Report Status 10/17/2015 FINAL  Final  Blood Culture ID Panel (Reflexed)     Status: Abnormal   Collection Time: 10/13/15  9:15 AM  Result Value Ref Range Status   Enterococcus species NOT DETECTED NOT  DETECTED Final   Vancomycin resistance NOT DETECTED NOT DETECTED Final   Listeria monocytogenes NOT DETECTED NOT DETECTED Final   Staphylococcus species DETECTED (A) NOT DETECTED Final   Staphylococcus aureus NOT DETECTED NOT DETECTED Final   Methicillin resistance DETECTED (A) NOT DETECTED Final   Streptococcus species NOT DETECTED NOT DETECTED Final   Streptococcus agalactiae NOT DETECTED NOT DETECTED Final   Streptococcus pneumoniae NOT DETECTED NOT DETECTED Final   Streptococcus pyogenes NOT DETECTED NOT DETECTED Final   Acinetobacter baumannii NOT DETECTED NOT DETECTED Final   Enterobacteriaceae species NOT DETECTED NOT DETECTED Final   Enterobacter cloacae complex NOT DETECTED NOT DETECTED Final   Escherichia coli NOT DETECTED NOT DETECTED Final   Klebsiella oxytoca NOT DETECTED NOT DETECTED Final   Klebsiella pneumoniae NOT DETECTED NOT DETECTED Final   Proteus species NOT DETECTED NOT DETECTED Final   Serratia marcescens NOT DETECTED NOT DETECTED Final   Carbapenem resistance NOT DETECTED NOT DETECTED Final   Haemophilus influenzae NOT DETECTED NOT DETECTED Final   Neisseria meningitidis NOT DETECTED NOT DETECTED Final   Pseudomonas aeruginosa NOT DETECTED NOT DETECTED Final   Candida albicans NOT DETECTED NOT DETECTED Final   Candida glabrata NOT DETECTED NOT DETECTED Final   Candida krusei NOT DETECTED NOT DETECTED Final   Candida parapsilosis NOT DETECTED NOT DETECTED Final   Candida tropicalis NOT DETECTED NOT DETECTED Final  Culture, Urine     Status: Abnormal   Collection Time: 10/13/15  5:19 PM  Result Value Ref Range Status   Specimen Description URINE, CATHETERIZED  Final   Special Requests NONE  Final   Culture >=100,000 COLONIES/mL YEAST (A)  Final   Report Status 10/14/2015 FINAL  Final  Culture, blood (routine x 2)     Status: None (Preliminary result)   Collection Time: 10/16/15  3:52 PM  Result Value Ref Range Status   Specimen Description BLOOD RIGHT HAND   Final   Special Requests IN PEDIATRIC BOTTLE 3CC  Final   Culture NO GROWTH 3 DAYS  Final   Report Status PENDING  Incomplete  Culture, blood (routine x 2)     Status: None (Preliminary result)   Collection Time: 10/16/15  3:57 PM  Result Value Ref Range Status   Specimen Description BLOOD RIGHT HAND  Final   Special Requests IN PEDIATRIC BOTTLE 3CC  Final   Culture NO GROWTH 3 DAYS  Final   Report Status PENDING  Incomplete      Radiology Studies: No results found.  Scheduled Meds: . allopurinol  100 mg Oral Daily  . antiseptic oral rinse  7 mL Mouth Rinse q12n4p  . aspirin EC  81 mg Oral Daily  . brimonidine  1 drop Both Eyes BID  . buPROPion  150 mg Oral Daily  . chlorhexidine  15 mL Mouth Rinse BID  . cholecalciferol  1,000 Units Oral BID  . fluconazole  100 mg Oral Daily  . hydrALAZINE  25 mg Oral Q6H  . isosorbide mononitrate  60 mg Oral Daily  . levothyroxine  137 mcg Oral QAC breakfast  . metolazone  2.5 mg Oral Daily  . metoprolol tartrate  50 mg Oral BID  . mometasone-formoterol  2 puff Inhalation BID  . pantoprazole  40 mg Oral BID  . polyethylene glycol  17 g Oral Daily  . QUEtiapine  12.5 mg Oral QHS  . timolol  1 drop Both Eyes BID  . torsemide  20 mg Oral Daily  . warfarin  0.5 mg Oral ONCE-1800   Continuous Infusions: . sodium chloride 10 mL/hr at 10/17/15 0626     LOS: 16 days    Time spent: 25 minutes  Greater than 50% of the time spent on counseling and coordinating the care.   Leisa Lenz, MD [facility] Pager [phone]  If 7PM-7AM, please contact night-coverage [email or web address] Password [facility] 10/21/2015, 10:39 AM

## 2015-10-21 NOTE — Progress Notes (Signed)
ANTICOAGULATION CONSULT NOTE - Follow Up Consult  Pharmacy Consult for Coumadin Indication: atrial fibrillation  Allergies  Allergen Reactions  . Amlodipine     edema  . Atorvastatin Other (See Comments)    Muscle aches  . Colesevelam Other (See Comments)    unknown  . Coumadin [Warfarin Sodium]     "causes cramps" reaction to brand name only per pt  . Lasix [Furosemide] Other (See Comments)    Muscle cramps  . Statins Other (See Comments)    Muscle aches  . Tape Rash    Patient Measurements: Height: 5\' 6"  (167.6 cm) Weight: 187 lb (84.823 kg) IBW/kg (Calculated) : 59.3   Vital Signs: Temp: 99.3 F (37.4 C) (05/29 0631) Temp Source: Axillary (05/29 0631) BP: 144/97 mmHg (05/29 0631) Pulse Rate: 65 (05/29 0903)  Labs:  Recent Labs  10/19/15 0427 10/20/15 0636 10/21/15 0604  HGB 8.6* 9.2*  --   HCT 31.8* 33.4*  --   PLT 364 382  --   LABPROT 27.3* 30.5* 33.8*  INR 2.58* 2.98* 3.42*  CREATININE 2.56*  --   --     Estimated Creatinine Clearance: 20.2 mL/min (by C-G formula based on Cr of 2.56).   Medications:  Scheduled:  . allopurinol  100 mg Oral Daily  . antiseptic oral rinse  7 mL Mouth Rinse q12n4p  . aspirin EC  81 mg Oral Daily  . brimonidine  1 drop Both Eyes BID  . buPROPion  150 mg Oral Daily  . chlorhexidine  15 mL Mouth Rinse BID  . cholecalciferol  1,000 Units Oral BID  . fluconazole  100 mg Oral Daily  . hydrALAZINE  25 mg Oral Q6H  . isosorbide mononitrate  60 mg Oral Daily  . levothyroxine  137 mcg Oral QAC breakfast  . metolazone  2.5 mg Oral Daily  . metoprolol tartrate  50 mg Oral BID  . mometasone-formoterol  2 puff Inhalation BID  . pantoprazole  40 mg Oral BID  . polyethylene glycol  17 g Oral Daily  . QUEtiapine  12.5 mg Oral QHS  . timolol  1 drop Both Eyes BID  . torsemide  20 mg Oral Daily  . Warfarin - Pharmacist Dosing Inpatient   Does not apply q1800    Assessment: 80yo female with AFib, and hx DVT 2010. INR up to  3.42 after 5 doses of Coumadin. Hg is stable and pltc wnl.  Pt on Fluconazole, which may increase Coumadin effect.   Goal of Therapy:  INR 2-3 Monitor platelets by anticoagulation protocol: Yes  Plan:  No Coumadin today Daily INR Watch for s/s of bleeding  Gracy Bruins, PharmD [facility]

## 2015-10-21 NOTE — Care Management Important Message (Signed)
Important Message  Patient Details  Name: Wendy Cole MRN: [id number] Date of Birth: 05/29/1935   Medicare Important Message Given:  Yes    Quinten Allerton P Jakin 10/21/2015, 10:06 AM

## 2015-10-22 LAB — CBC
HCT: 33.5 % — ABNORMAL LOW (ref 36.0–46.0)
HEMOGLOBIN: 9.3 g/dL — AB (ref 12.0–15.0)
MCH: 22.1 pg — AB (ref 26.0–34.0)
MCHC: 27.8 g/dL — AB (ref 30.0–36.0)
MCV: 79.6 fL (ref 78.0–100.0)
PLATELETS: 423 10*3/uL — AB (ref 150–400)
RBC: 4.21 MIL/uL (ref 3.87–5.11)
RDW: 24.8 % — AB (ref 11.5–15.5)
WBC: 7.7 10*3/uL (ref 4.0–10.5)

## 2015-10-22 LAB — BASIC METABOLIC PANEL
ANION GAP: 16 — AB (ref 5–15)
BUN: 56 mg/dL — ABNORMAL HIGH (ref 6–20)
CALCIUM: 8.8 mg/dL — AB (ref 8.9–10.3)
CO2: 25 mmol/L (ref 22–32)
Chloride: 98 mmol/L — ABNORMAL LOW (ref 101–111)
Creatinine, Ser: 3.15 mg/dL — ABNORMAL HIGH (ref 0.44–1.00)
GFR calc Af Amer: 15 mL/min — ABNORMAL LOW (ref >60)
GFR, EST NON AFRICAN AMERICAN: 13 mL/min — AB (ref >60)
GLUCOSE: 91 mg/dL (ref 65–99)
Potassium: 3.7 mmol/L (ref 3.5–5.1)
SODIUM: 139 mmol/L (ref 135–145)

## 2015-10-22 LAB — PROTIME-INR
INR: 3.63 — AB (ref 0.00–1.49)
PROTHROMBIN TIME: 35.3 s — AB (ref 11.6–15.2)

## 2015-10-22 MED ORDER — LORAZEPAM 2 MG/ML IJ SOLN
INTRAMUSCULAR | Status: AC
  Administered 2015-10-22: 13:00:00
  Filled 2015-10-22: qty 1

## 2015-10-22 MED ORDER — LORAZEPAM 2 MG/ML IJ SOLN: 1.0000 mg | INTRAMUSCULAR | Status: DC | PRN

## 2015-10-22 MED ORDER — HALOPERIDOL LACTATE 5 MG/ML IJ SOLN
2.0000 mg | Freq: Four times a day (QID) | INTRAMUSCULAR | Status: DC | PRN
  Administered 2015-10-22 – 2015-10-24 (×3): 2 mg via INTRAVENOUS
  Filled 2015-10-22 (×3): qty 1

## 2015-10-22 MED ORDER — LORAZEPAM 2 MG/ML IJ SOLN: 1.0000 mg | Freq: Four times a day (QID) | INTRAMUSCULAR | Status: DC | PRN

## 2015-10-22 NOTE — Progress Notes (Signed)
Physical Therapy Treatment Patient Details Name: Wendy Cole MRN: [id number] DOB: 25-Feb-1936 Today's Date: 10/22/2015    History of Present Illness This 80 y.o. female was brought to ED when she was unable to get up from the toilet.  In ED pt was grossly volume overloaded and had Guiac + stool.  Hgb 5.8 and INR 5.8.  Dx: UTI, diastolic HF exacerbation, GI bleed, OSA, paroxysmal A-Fib, toxic metabolic encephalopathy w/ increasing confusion likely UTI contributing along w/ steroid induced delirium.[name]  PMH includes:  Anxiety/depression, A-Fib, chronic severe diastolic CHF, HTN, stage III CKD, h/o DVT, OSA, Breast CA, s/p Rt mastectomy.     PT Comments    Pt performed limited mobility requiring increased time due to assistance level and cognition.  Pt performed multiple reps of supine to sit after immediately returning to supine x2.  Pt able to sit edge of bed when sara plus placed in front of pt to rest elbows on support.  Pt performed sit to stand in sara lift with emphasis on hip and trunk extension.  Pt required shorter standing tolerance initially to allow PTA for facilitate extension and re tighten belt apparatus for improve fit.     Follow Up Recommendations  SNF;Supervision/Assistance - 24 hour     Equipment Recommendations  Other (comment) (TBD at next venue of care.  )    Recommendations for Other Services       Precautions / Restrictions Precautions Precautions: Fall Restrictions Weight Bearing Restrictions: No    Mobility  Bed Mobility Overal bed mobility: Needs Assistance Bed Mobility: Supine to Sit     Supine to sit: Total assist (+1 but would remain to benefit from +2 assist.  )     General bed mobility comments: Assist to elevate trunk and managing Bil LEs.  With cues pt reaches out for support.  Use of bed pad for positioning.  Pt sat edge of bed with sara plus in front for propping of arms on arm rest.  Pt reluctant to hold handles but rests elbows.  Pt able  to sit unsupported holding to sara plus fro limited amounts of time.    Transfers Overall transfer level: Needs assistance Equipment used: Ambulation equipment used   Sit to Stand: Total assist (Pt required total assist for trunk control and hips extension in sara plus lift equipment.  Performed x4 trials for proper positioning before transitioning patient from bed to chair.  )         General transfer comment: Pt required cues for upper trunk extension and B hip extension to improve positiioning in standing with in sara plus.  Pt remains weak and her ability to focus limits her ability to follow commands.  pt able to follow commands for knee extension.    Ambulation/Gait             General Gait Details: unable to attempt, informed nursing to use lift equipment for back to bed transfer.     Stairs            Wheelchair Mobility    Modified Rankin (Stroke Patients Only)       Balance Overall balance assessment: Needs assistance Sitting-balance support: No upper extremity supported Sitting balance-Leahy Scale: Poor Sitting balance - Comments: up to min assist, primarily closeguard for 4 minutes     Standing balance-Leahy Scale: Zero                      Cognition Arousal/Alertness: Lethargic (would  not open eyes.  ) Behavior During Therapy: Restless Overall Cognitive Status: Impaired/Different from baseline Area of Impairment: Orientation;Attention;Memory;Safety/judgement;Problem solving;Following commands;Awareness Orientation Level: Disoriented to;Situation;Time;Place Current Attention Level: Focused Memory: Decreased short-term memory Following Commands: Follows one step commands inconsistently Safety/Judgement: Decreased awareness of safety;Decreased awareness of deficits Awareness: Intellectual Problem Solving: Difficulty sequencing;Requires verbal cues;Slow processing;Decreased initiation;Requires tactile cues General Comments: turns head  "what?" when name called, but very limited attention span; initially resists some movements, then assists after initiated    Exercises      General Comments        Pertinent Vitals/Pain Pain Assessment: 0-10 Faces Pain Scale: Hurts little more Pain Location: L arm/hand to touch Pain Descriptors / Indicators: Grimacing;Guarding;Discomfort Pain Intervention(s): Monitored during session;Repositioned    Home Living                      Prior Function            PT Goals (current goals can now be found in the care plan section) Acute Rehab PT Goals Patient Stated Goal: none stated; confused Potential to Achieve Goals: Fair Progress towards PT goals: Progressing toward goals    Frequency  Min 2X/week    PT Plan Current plan remains appropriate    Co-evaluation             End of Session Equipment Utilized During Treatment: Gait belt;Oxygen (sara plus lift equipment.  ) Activity Tolerance: Patient limited by fatigue;Patient limited by lethargy;Patient limited by pain Patient left: in chair;with call bell/phone within reach;with chair alarm set     Time: [id number] PT Time Calculation (min) (ACUTE ONLY): 42 min  Charges:  $Therapeutic Activity: 38-52 mins                    G Codes:      Cristela Blue 2015/11/16, 11:34 AM  Governor Rooks, PTA pager [phone]

## 2015-10-22 NOTE — Progress Notes (Addendum)
[name], female   DOB: 07-31-1935, 80 y.o.   MRN: [id number]  PROGRESS NOTE    Wendy Cole  [id number] DOB: [birth year] DOA: 10/05/2015  PCP: Walker Kehr, MD   Brief Narrative:  80 year old female with past medical history significant for CVA, CAD, paroxysmal atrial fibrillation on anticoagulation with Coumadin, chronic severe diastolic congestive heart failure, chronic kidney disease stage III, hypothyroidism, dyslipidemia, DVT, obstructive sleep apnea, breast cancer status post right mastectomy. Patient presented to [facility] 10/05/2015 with bilateral lower extremity swelling and shortness of breath in addition to progressive weakness since hospital discharge back in March 2017. On admission, patient was volume overloaded with other findings of guaiac positive stool. Her hemoglobin was 5.8 and INR was 5.8.  Assessment & Plan:  Obtundation / Toxic metabolic encephalopathy / yeast UTI / Agitation / Psychosis  - Probably related to UTI, urine culture grew yeast and she is treated with fluconazole - B12, folate, and ammonia recently normal  - Patient was seen by psychiatry and their thought is that patient may have had steroid induced delirium but currently she is off of steroids and mental status still not improved - Continue Seroquel 12.5 mg at bedtime - Ativan and haldol PRN for restlessness and / or agitation   Staph agalactiae UTI / Coagulase negative bacteremia  - Has completed a course of rocephin - no sx of ongoing infection  - Please note blood cultures with staph species coagulase negative which suggests contamination  Occult GI bleed / acute blood loss anemia in the setting of supratherapeutic INR - Per prior documentation, patient's husband did not want a GI evaluation at this time - Patient has received 3 units of PRBC transfusion during this hospital stay - No reports of bleeding while on Coumadin  - Off note, she was given  vitamin K on admission for supratherapeutic INR which will corrected INR  Acute exacerbation of chronic grade 2 diastolic CHF - Baseline weight 93 kg 07/27/2015  - Weight in past 48 hours: 84.8 kg --> 83.9 kg  - Stop torsemide due to worsening renal function   Chronic kidney disease stage III  - Creatinine at 2.56 on 10/19/2015  - Cr this am 3.15; possibly from torsemide so will stop it today  - Check BMP in am  Paroxysmal Atrial fibrillation - CHADS vasc score 4 - Per patient's husband, wanted to resume Coumadin so this was done and so far tolerates well  - INR 3.63, dosing per pharmacy  - Rate controlled with metoprolol   Essential hypertension - Continue metoprolol, imdur, hydralazine  - BP 130/63  OSA - CPAP QHS   Hypothyroidism - Continue Synthroid  - TSH normal 10/05/15  Remote hx of DVT - Most recent duplex in 2014 was negative for DVT - LE doppler 5/16 - no DVT  Obesity  - Body mass index is 30.68 kg/(m^2).   DVT prophylaxis: On anticoagulation with Coumadin Code Status: DNR/DNI  Family Communication: No family at the bedside; spoke with pt husband over the phone 5/29; he said he cant take care of his wife due t his own medical problems and asks for SNF placement  Disposition Plan: Anticipate discharge once SNF bed available. SW consulted for SNF placement    Consultants:   CHF Team  [facility]   Procedures:   PICC 5/13  B LE venous duplex - 5/16 - negative for DVT   Antimicrobials:   Ceftriaxone 5/14 > 5/19  Diflucan 5/23 >  Subjective: No overnight events.   Objective: Filed Vitals:   10/21/15 0903 10/21/15 1426 10/21/15 2100 10/22/15 0541  BP:  133/38  134/54  Pulse: 65 64 66 67  Temp:    99 F (37.2 C)  TempSrc:    Axillary  Resp: 17 16 17 18   Height:      Weight:    83.9 kg (184 lb 15.5 oz)  SpO2: 96% 100% 99% 99%    Intake/Output Summary (Last 24 hours) at 10/22/15 0643 Last data filed at 10/21/15 1830  Gross per 24 hour   Intake     40 ml  Output      0 ml  Net     40 ml   Filed Weights   10/20/15 0500 10/21/15 0500 10/22/15 0541  Weight: 84.369 kg (186 lb) 84.823 kg (187 lb) 83.9 kg (184 lb 15.5 oz)    Examination:  General exam: no distress Respiratory system: Bilateral air entry, no wheezing  Cardiovascular system: rate controlled, S1 & S2 (+),Soft systolic murmur, 2 out of 6 auscultated at the apex Gastrointestinal system: (+) BS, no distention, no tenderness  Central nervous system:non focal Extremities: No tenderness, palpable pulses   Skin: warm skin, no lesions  Psychiatry: appreciate restlessness, no agitation   Data Reviewed: I have personally reviewed following labs and imaging studies  CBC:  Recent Labs Lab 10/17/15 0510 10/19/15 0427 10/20/15 0636 10/22/15 0515  WBC 7.7 6.8 7.1 7.7  HGB 9.2* 8.6* 9.2* 9.3*  HCT 33.8* 31.8* 33.4* 33.5*  MCV 79.0 80.1 81.1 79.6  PLT 337 364 382 [id number]*   Basic Metabolic Panel:  Recent Labs Lab 10/17/15 0510 10/19/15 0427  NA 140 140  K 3.5 3.4*  CL 95* 100*  CO2 31 30  GLUCOSE 100* 104*  BUN 66* 54*  CREATININE 2.85* 2.56*  CALCIUM 8.8* 8.8*   GFR: Estimated Creatinine Clearance: 20.1 mL/min (by C-G formula based on Cr of 2.56). Liver Function Tests:  Recent Labs Lab 10/17/15 0510 10/19/15 0427  AST 35 33  ALT 21 22  ALKPHOS 107 116  BILITOT 2.4* 1.6*  PROT 6.0* 5.6*  ALBUMIN 2.5* 2.5*   No results for input(s): LIPASE, AMYLASE in the last 168 hours. No results for input(s): AMMONIA in the last 168 hours. Coagulation Profile:  Recent Labs Lab 10/17/15 0510 10/18/15 0600 10/19/15 0427 10/20/15 0636 10/21/15 0604  INR 1.86* 2.08* 2.58* 2.98* 3.42*   Cardiac Enzymes: No results for input(s): CKTOTAL, CKMB, CKMBINDEX, TROPONINI in the last 168 hours. BNP (last 3 results)  Recent Labs  07/22/15 1503  PROBNP >5000.0*   HbA1C: No results for input(s): HGBA1C in the last 72 hours. CBG: No results for  input(s): GLUCAP in the last 168 hours. Lipid Profile: No results for input(s): CHOL, HDL, LDLCALC, TRIG, CHOLHDL, LDLDIRECT in the last 72 hours. Thyroid Function Tests: No results for input(s): TSH, T4TOTAL, FREET4, T3FREE, THYROIDAB in the last 72 hours. Anemia Panel: No results for input(s): VITAMINB12, FOLATE, FERRITIN, TIBC, IRON, RETICCTPCT in the last 72 hours. Urine analysis:    Component Value Date/Time   COLORURINE YELLOW 10/05/2015 1233   APPEARANCEUR TURBID* 10/05/2015 1233   LABSPEC 1.019 10/05/2015 1233   LABSPEC 1.010 06/21/2013 1213   PHURINE 5.0 10/05/2015 1233   PHURINE 6.0 06/21/2013 1213   GLUCOSEU NEGATIVE 10/05/2015 1233   GLUCOSEU NEGATIVE 03/20/2014 1454   GLUCOSEU Negative 06/21/2013 1213   HGBUR LARGE* 10/05/2015 1233   HGBUR Negative 06/21/2013 1213   BILIRUBINUR NEGATIVE  10/05/2015 1233   BILIRUBINUR Negative 06/21/2013 1213   [place] 10/05/2015 1233   KETONESUR Negative 06/21/2013 1213   PROTEINUR 100* 10/05/2015 1233   PROTEINUR 100 06/21/2013 1213   UROBILINOGEN 0.2 03/20/2014 1454   UROBILINOGEN 0.2 06/21/2013 1213   NITRITE NEGATIVE 10/05/2015 1233   NITRITE Negative 06/21/2013 1213   LEUKOCYTESUR LARGE* 10/05/2015 1233   LEUKOCYTESUR Negative 06/21/2013 1213   Sepsis Labs: @LABRCNTIP (procalcitonin:4,lacticidven:4)    Culture, blood (routine x 2)     Status: None   Collection Time: 10/13/15  9:08 AM  Result Value Ref Range Status   Specimen Description BLOOD LEFT ARM  Final   Special Requests BOTTLES DRAWN AEROBIC ONLY 5CC  Final   Culture NO GROWTH 5 DAYS  Final   Report Status 10/18/2015 FINAL  Final  Culture, blood (routine x 2)     Status: Abnormal   Collection Time: 10/13/15  9:15 AM  Result Value Ref Range Status   Specimen Description BLOOD RIGHT HAND  Final   Special Requests BOTTLES DRAWN AEROBIC ONLY 10CC  Final   Culture  Setup Time   Final   Culture (A)  Final    STAPHYLOCOCCUS SPECIES (COAGULASE NEGATIVE)     Report Status 10/17/2015 FINAL  Final  Blood Culture ID Panel (Reflexed)     Status: Abnormal   Collection Time: 10/13/15  9:15 AM  Result Value Ref Range Status   Enterococcus species NOT DETECTED NOT DETECTED Final   Vancomycin resistance NOT DETECTED NOT DETECTED Final   Listeria monocytogenes NOT DETECTED NOT DETECTED Final   Staphylococcus species DETECTED (A) NOT DETECTED Final   Staphylococcus aureus NOT DETECTED NOT DETECTED Final   Methicillin resistance DETECTED (A) NOT DETECTED Final   Streptococcus species NOT DETECTED NOT DETECTED Final   Streptococcus agalactiae NOT DETECTED NOT DETECTED Final   Streptococcus pneumoniae NOT DETECTED NOT DETECTED Final   Streptococcus pyogenes NOT DETECTED NOT DETECTED Final   Acinetobacter baumannii NOT DETECTED NOT DETECTED Final   Enterobacteriaceae species NOT DETECTED NOT DETECTED Final   Enterobacter cloacae complex NOT DETECTED NOT DETECTED Final   Escherichia coli NOT DETECTED NOT DETECTED Final   Klebsiella oxytoca NOT DETECTED NOT DETECTED Final   Klebsiella pneumoniae NOT DETECTED NOT DETECTED Final   Proteus species NOT DETECTED NOT DETECTED Final   Serratia marcescens NOT DETECTED NOT DETECTED Final   Carbapenem resistance NOT DETECTED NOT DETECTED Final   Haemophilus influenzae NOT DETECTED NOT DETECTED Final   Neisseria meningitidis NOT DETECTED NOT DETECTED Final   Pseudomonas aeruginosa NOT DETECTED NOT DETECTED Final   Candida albicans NOT DETECTED NOT DETECTED Final   Candida glabrata NOT DETECTED NOT DETECTED Final   Candida krusei NOT DETECTED NOT DETECTED Final   Candida parapsilosis NOT DETECTED NOT DETECTED Final   Candida tropicalis NOT DETECTED NOT DETECTED Final  Culture, Urine     Status: Abnormal   Collection Time: 10/13/15  5:19 PM  Result Value Ref Range Status   Specimen Description URINE, CATHETERIZED  Final   Special Requests NONE  Final   Culture >=100,000 COLONIES/mL YEAST (A)  Final   Report  Status 10/14/2015 FINAL  Final  Culture, blood (routine x 2)     Status: None (Preliminary result)   Collection Time: 10/16/15  3:52 PM  Result Value Ref Range Status   Specimen Description BLOOD RIGHT HAND  Final   Special Requests IN PEDIATRIC BOTTLE 3CC  Final   Culture NO GROWTH 3 DAYS  Final  Report Status PENDING  Incomplete  Culture, blood (routine x 2)     Status: None (Preliminary result)   Collection Time: 10/16/15  3:57 PM  Result Value Ref Range Status   Specimen Description BLOOD RIGHT HAND  Final   Special Requests IN PEDIATRIC BOTTLE 3CC  Final   Culture NO GROWTH 3 DAYS  Final   Report Status PENDING  Incomplete      Radiology Studies: No results found.  Scheduled Meds: . allopurinol  100 mg Oral Daily  . antiseptic oral rinse  7 mL Mouth Rinse q12n4p  . aspirin EC  81 mg Oral Daily  . brimonidine  1 drop Both Eyes BID  . buPROPion  150 mg Oral Daily  . chlorhexidine  15 mL Mouth Rinse BID  . cholecalciferol  1,000 Units Oral BID  . fluconazole  100 mg Oral Daily  . hydrALAZINE  25 mg Oral Q6H  . isosorbide mononitrate  60 mg Oral Daily  . levothyroxine  137 mcg Oral QAC breakfast  . metolazone  2.5 mg Oral Daily  . metoprolol tartrate  50 mg Oral BID  . mometasone-formoterol  2 puff Inhalation BID  . pantoprazole  40 mg Oral BID  . polyethylene glycol  17 g Oral Daily  . QUEtiapine  12.5 mg Oral QHS  . timolol  1 drop Both Eyes BID  . torsemide  20 mg Oral Daily  . warfarin  0.5 mg Oral ONCE-1800   Continuous Infusions: . sodium chloride 10 mL/hr at 10/17/15 0626     LOS: 17 days    Time spent: 15 minutes  Greater than 50% of the time spent on counseling and coordinating the care.   Leisa Lenz, MD [facility] Pager [phone]  If 7PM-7AM, please contact night-coverage [email or web address] Password [facility] 10/22/2015, 6:43 AM

## 2015-10-22 NOTE — Progress Notes (Signed)
ANTICOAGULATION CONSULT NOTE - Follow Up Consult  Pharmacy Consult for Coumadin Indication: atrial fibrillation  Allergies  Allergen Reactions  . Amlodipine     edema  . Atorvastatin Other (See Comments)    Muscle aches  . Colesevelam Other (See Comments)    unknown  . Coumadin [Warfarin Sodium]     "causes cramps" reaction to brand name only per pt  . Lasix [Furosemide] Other (See Comments)    Muscle cramps  . Statins Other (See Comments)    Muscle aches  . Tape Rash    Patient Measurements: Height: 5\' 6"  (167.6 cm) Weight: 184 lb 15.5 oz (83.9 kg) IBW/kg (Calculated) : 59.3   Vital Signs: Temp: 99 F (37.2 C) (05/30 0541) Temp Source: Axillary (05/30 0541) BP: 134/54 mmHg (05/30 0541) Pulse Rate: 67 (05/30 0541)  Labs:  Recent Labs  10/20/15 0636 10/21/15 0604 10/22/15 0515  HGB 9.2*  --  9.3*  HCT 33.4*  --  33.5*  PLT 382  --  423*  LABPROT 30.5* 33.8* 35.3*  INR 2.98* 3.42* 3.63*  CREATININE  --   --  3.15*    Estimated Creatinine Clearance: 16.3 mL/min (by C-G formula based on Cr of 3.15).   Medications:  Scheduled:  . allopurinol  100 mg Oral Daily  . antiseptic oral rinse  7 mL Mouth Rinse q12n4p  . aspirin EC  81 mg Oral Daily  . brimonidine  1 drop Both Eyes BID  . buPROPion  150 mg Oral Daily  . chlorhexidine  15 mL Mouth Rinse BID  . cholecalciferol  1,000 Units Oral BID  . fluconazole  100 mg Oral Daily  . hydrALAZINE  25 mg Oral Q6H  . isosorbide mononitrate  60 mg Oral Daily  . levothyroxine  137 mcg Oral QAC breakfast  . metolazone  2.5 mg Oral Daily  . metoprolol tartrate  50 mg Oral BID  . mometasone-formoterol  2 puff Inhalation BID  . pantoprazole  40 mg Oral BID  . polyethylene glycol  17 g Oral Daily  . QUEtiapine  12.5 mg Oral QHS  . timolol  1 drop Both Eyes BID  . torsemide  20 mg Oral Daily  . Warfarin - Pharmacist Dosing Inpatient   Does not apply q1800    Assessment: 80yo female on Coumadin 3mg  daily PTA for hx  of DVT but last anticoag visit on 5/12 showed an INR of 6.7 so was holding dose. Resumed 5/24 at a lower dose. Lower GIB on admit with INR 5.13.  INR continues to trend up to 3.63. Dose held yesterday. Hgb stable at 9.3, plts 423. Fluconazole may elevate the INR.  Goal of Therapy:  INR 2-3 Monitor platelets by anticoagulation protocol: Yes  Plan:  No Coumadin today Monitor daily INR, CBC, s/s of bleed  Elenor Quinones, PharmD, [facility] Clinical Pharmacist Pager [phone] 10/22/2015 9:59 AM

## 2015-10-22 NOTE — Progress Notes (Signed)
Occupational Therapy Treatment Patient Details Name: Wendy Cole MRN: [id number] DOB: [birth year] Today's Date: 10/22/2015    History of present illness This 80 y.o. female was brought to ED when she was unable to get up from the toilet.  In ED pt was grossly volume overloaded and had Guiac + stool.  Hgb 5.8 and INR 5.8.  Dx: UTI, diastolic HF exacerbation, GI bleed, OSA, paroxysmal A-Fib, toxic metabolic encephalopathy w/ increasing confusion likely UTI contributing along w/ steroid induced delirium.[name]  PMH includes:  Anxiety/depression, A-Fib, chronic severe diastolic CHF, HTN, stage III CKD, h/o DVT, OSA, Breast CA, s/p Rt mastectomy.    OT comments  Patient did not open eyes, had great difficulty following commands, and was lethargic during treatment session. Total A with ADLs at this time. Occasionally yells out, "Poppy!" Resists all movement of arms. OT will continue to follow.   Follow Up Recommendations  SNF;Supervision/Assistance - 24 hour    Equipment Recommendations  3 in 1 bedside comode;Hospital bed    Recommendations for Other Services      Precautions / Restrictions Precautions Precautions: Fall Restrictions Weight Bearing Restrictions: No       Mobility Bed Mobility                  Transfers                      Balance                                   ADL Overall ADL's : Needs assistance/impaired     Grooming: Wash/dry face;Wash/dry hands;Oral care;Total assistance                                 General ADL Comments: Patient difficult to arouse, but would occasionally yell out "Poppy!". Reponded when her first name was called by yelling "What?" but would not open eyes even to command. Resisted BUE movement. Total A wash face and swab mouth with toothette. No family present.      Vision                     Perception     Praxis      Cognition   Behavior During Therapy:  Restless Overall Cognitive Status: Impaired/Different from baseline Area of Impairment: Orientation;Attention;Memory;Safety/judgement;Problem solving;Following commands;Awareness Orientation Level: Disoriented to;Situation;Time;Place Current Attention Level: Focused Memory: Decreased short-term memory  Following Commands: Follows one step commands inconsistently Safety/Judgement: Decreased awareness of safety;Decreased awareness of deficits   Problem Solving: Difficulty sequencing;Requires verbal cues;Slow processing;Decreased initiation;Requires tactile cues General Comments: turns head "what?" when name called, but very limited attention span; initially resists some movements, then assists after initiated    Extremity/Trunk Assessment               Exercises     Shoulder Instructions       General Comments      Pertinent Vitals/ Pain       Pain Assessment: Faces Faces Pain Scale: Hurts a little bit Pain Descriptors / Indicators: Grimacing Pain Intervention(s): Limited activity within patient's tolerance;Monitored during session  Home Living  Prior Functioning/Environment              Frequency Min 2X/week     Progress Toward Goals  OT Goals(current goals can now be found in the care plan section)  Progress towards OT goals: Not progressing toward goals - comment     Plan Discharge plan remains appropriate    Co-evaluation                 End of Session     Activity Tolerance Patient limited by lethargy   Patient Left in bed;with call bell/phone within reach;with bed alarm set   Nurse Communication          Time: [id number] OT Time Calculation (min): 10 min  Charges: OT General Charges $OT Visit: 1 Procedure OT Treatments $Therapeutic Activity: 8-22 mins  Somtochukwu Woollard A 10/22/2015, 11:10 AM

## 2015-10-23 ENCOUNTER — Encounter (HOSPITAL_COMMUNITY): Payer: Self-pay

## 2015-10-23 ENCOUNTER — Encounter: Payer: Self-pay | Admitting: Internal Medicine

## 2015-10-23 DIAGNOSIS — I5032 Chronic diastolic (congestive) heart failure: Secondary | ICD-10-CM

## 2015-10-23 DIAGNOSIS — I5033 Acute on chronic diastolic (congestive) heart failure: Secondary | ICD-10-CM

## 2015-10-23 DIAGNOSIS — R1084 Generalized abdominal pain: Secondary | ICD-10-CM

## 2015-10-23 DIAGNOSIS — R4182 Altered mental status, unspecified: Secondary | ICD-10-CM

## 2015-10-23 DIAGNOSIS — N183 Chronic kidney disease, stage 3 (moderate): Secondary | ICD-10-CM

## 2015-10-23 DIAGNOSIS — I82509 Chronic embolism and thrombosis of unspecified deep veins of unspecified lower extremity: Secondary | ICD-10-CM

## 2015-10-23 DIAGNOSIS — F419 Anxiety disorder, unspecified: Secondary | ICD-10-CM

## 2015-10-23 DIAGNOSIS — K922 Gastrointestinal hemorrhage, unspecified: Principal | ICD-10-CM

## 2015-10-23 DIAGNOSIS — I10 Essential (primary) hypertension: Secondary | ICD-10-CM

## 2015-10-23 DIAGNOSIS — D649 Anemia, unspecified: Secondary | ICD-10-CM

## 2015-10-23 DIAGNOSIS — Z86718 Personal history of other venous thrombosis and embolism: Secondary | ICD-10-CM

## 2015-10-23 LAB — HEPATIC FUNCTION PANEL
ALBUMIN: 2.4 g/dL — AB (ref 3.5–5.0)
ALK PHOS: 154 U/L — AB (ref 38–126)
ALT: 21 U/L (ref 14–54)
AST: 39 U/L (ref 15–41)
BILIRUBIN INDIRECT: 0.8 mg/dL (ref 0.3–0.9)
Bilirubin, Direct: 0.6 mg/dL — ABNORMAL HIGH (ref 0.1–0.5)
TOTAL PROTEIN: 5.7 g/dL — AB (ref 6.5–8.1)
Total Bilirubin: 1.4 mg/dL — ABNORMAL HIGH (ref 0.3–1.2)

## 2015-10-23 LAB — PROTIME-INR
INR: 3.73 — AB (ref 0.00–1.49)
PROTHROMBIN TIME: 36 s — AB (ref 11.6–15.2)

## 2015-10-23 LAB — BASIC METABOLIC PANEL
ANION GAP: 13 (ref 5–15)
BUN: 60 mg/dL — ABNORMAL HIGH (ref 6–20)
CALCIUM: 8.4 mg/dL — AB (ref 8.9–10.3)
CO2: 27 mmol/L (ref 22–32)
CREATININE: 3.96 mg/dL — AB (ref 0.44–1.00)
Chloride: 97 mmol/L — ABNORMAL LOW (ref 101–111)
GFR, EST AFRICAN AMERICAN: 12 mL/min — AB (ref >60)
GFR, EST NON AFRICAN AMERICAN: 10 mL/min — AB (ref >60)
Glucose, Bld: 111 mg/dL — ABNORMAL HIGH (ref 65–99)
Potassium: 4.2 mmol/L (ref 3.5–5.1)
Sodium: 137 mmol/L (ref 135–145)

## 2015-10-23 LAB — CBC
HCT: 33.5 % — ABNORMAL LOW (ref 36.0–46.0)
HEMOGLOBIN: 9.3 g/dL — AB (ref 12.0–15.0)
MCH: 22.1 pg — AB (ref 26.0–34.0)
MCHC: 27.8 g/dL — AB (ref 30.0–36.0)
MCV: 79.8 fL (ref 78.0–100.0)
PLATELETS: 443 10*3/uL — AB (ref 150–400)
RBC: 4.2 MIL/uL (ref 3.87–5.11)
RDW: 24.9 % — ABNORMAL HIGH (ref 11.5–15.5)
WBC: 8.2 10*3/uL (ref 4.0–10.5)

## 2015-10-23 NOTE — Consult Note (Signed)
Requesting Physician: Dr. Ree Kida     Reason for consultation:  To evaluate for acute encephalopathy  HPI:                                                                                                                                         Wendy Cole is an 80 y.o. female patient who is admitted with thumb yeast UTI, currently listening to bacteremia for several days, with altered mental status. Previous EEG showed evidence of moderate to severe encephalopathy, no seizures. As patient continued to have altered mental status, neurology is consulted for further evaluation. Husband and son at bedside who provided history. They have not noticed any improvement in her mental status over the past few days during this hospitalization.   Brain MRI was done, did not show any acute pathology.  Past Medical History: Past Medical History  Diagnosis Date  . Anemia   . Anxiety states   . Depressive disorder, not elsewhere classified   . Type II or unspecified type diabetes mellitus without mention of complication, not stated as uncontrolled   . Hyperlipidemia   . Unspecified essential hypertension   . Renal insufficiency   . Osteoarthritis   . Gout   . Iritis   . Nonischemic cardiomyopathy (HCC)     EF 20 to 25% per echo 06/2011  . Chronic anticoagulation   . NSTEMI (non-ST elevated myocardial infarction) [facility]) Feb 2013    06/2011 cath  Mild nonobstructive disease. No LV gram  . PAF (paroxysmal atrial fibrillation) ([place])   . Obesity   . Cancer [facility]) 2010    Right breast  . DVT (deep venous thrombosis) ([place]) 2010  . Stroke [facility]) 2004    Left upper lobe  . Thyroid disease 1969    Three fourth of Thyroid removed  . Complication of anesthesia     "fights it", for colonoscopy  . HOH (hard of hearing)     deaf in L completely  . OSA on CPAP     CPAP at night  . GERD (gastroesophageal reflux disease)   . Hypothyroidism   . Peripheral vascular disease [facility]) Jan. 2015    Past  Surgical History  Procedure Laterality Date  . Breast lumpectomy    . Cataract extraction    . Foot surgery    . Tonsillectomy  1949  . Abdominal hysterectomy  1974  . Eye surgery  2008    Glaucoma shunt Right eye  . Parotid gland tumor excision  2000  . Cystectomy  1974    Left Breast, chin, left of groin area  . Thyroidectomy, partial  1969  . Endarterectomy  11/18/2011    rocedure: ENDARTERECTOMY CAROTID;  Surgeon: Conrad Firth, MD;  Location: [place];  Service: Vascular;  Laterality: Left;  [name] Mastectomy  right  . Mastectomy    . Cardiac catheterization  no stent  . Colonoscopy w/ polypectomy    . Endarterectomy  01/12/2012    Procedure: ENDARTERECTOMY CAROTID;  Surgeon: Conrad Red [place], MD;  Location: [facility] OR;  Service: Vascular;  Laterality: Right;  Right carotid endarterectomy with 1cm x 6cm vascuguard patch angioplasty.  . Carotid endarterectomy Left 11-18-11    cea  . Carotid endarterectomy Right 01-12-12    cea  . Left and right heart catheterization with coronary/graft angiogram Left 07/20/2011    Procedure: LEFT AND RIGHT HEART CATHETERIZATION WITH Beatrix Fetters;  Surgeon: Peter M Martinique, MD;  Location: [facility] CATH LAB;  Service: Cardiovascular;  Laterality: Left;    Family History: Family History  Problem Relation Age of Onset  . Hypertension    . Congestive Heart Failure Mother 31  . Breast cancer Mother   . Heart attack Mother   . Heart disease Father 2  . Heart disease Brother   . Coronary artery disease Son 40    2 Stents  . Heart attack Father   . AAA (abdominal aortic aneurysm) Brother     Social History:   reports that she quit smoking about 13 years ago. Her smoking use included Cigarettes. She has a 80 pack-year smoking history. She has never used smokeless tobacco. She reports that she does not drink alcohol or use illicit drugs.  Allergies:  Allergies  Allergen Reactions  . Amlodipine     edema  . Atorvastatin Other (See Comments)    Muscle  aches  . Colesevelam Other (See Comments)    unknown  . Coumadin [Warfarin Sodium]     "causes cramps" reaction to brand name only per pt  . Lasix [Furosemide] Other (See Comments)    Muscle cramps  . Statins Other (See Comments)    Muscle aches  . Tape Rash     Medications:                                                                                                                         Current facility-administered medications:  .  0.45 % sodium chloride infusion, , Intravenous, Continuous, Maryann Mikhail, DO, Last Rate: 75 mL/hr at 10/23/15 0954 .  acetaminophen (TYLENOL) suppository 650 mg, 650 mg, Rectal, Q4H PRN, Cherene Altes, MD, 650 mg at 10/14/15 0053 .  acetaminophen (TYLENOL) tablet 650 mg, 650 mg, Oral, Q4H PRN, Cherene Altes, MD, 650 mg at 10/22/15 0943 .  albuterol (PROVENTIL) (2.5 MG/3ML) 0.083% nebulizer solution 2.5 mg, 2.5 mg, Nebulization, Q2H PRN, Cherene Altes, MD, 2.5 mg at 10/06/15 1733 .  allopurinol (ZYLOPRIM) tablet 100 mg, 100 mg, Oral, Daily, Allie Bossier, MD, 100 mg at 10/23/15 1101 .  antiseptic oral rinse (CPC / CETYLPYRIDINIUM CHLORIDE 0.05%) solution 7 mL, 7 mL, Mouth Rinse, q12n4p, Cherene Altes, MD, 7 mL at 10/21/15 1129 .  aspirin EC tablet 81 mg, 81 mg, Oral, Daily, Allie Bossier, MD, 81 mg at 10/23/15 1101 .  bisacodyl (DULCOLAX) suppository 10  mg, 10 mg, Rectal, Daily PRN, Cherene Altes, MD .  brimonidine (ALPHAGAN) 0.2 % ophthalmic solution 1 drop, 1 drop, Both Eyes, BID, Cherene Altes, MD, 1 drop at 10/23/15 1050 .  buPROPion (WELLBUTRIN SR) 12 hr tablet 150 mg, 150 mg, Oral, Daily, Allie Bossier, MD, 150 mg at 10/23/15 1100 .  chlorhexidine (PERIDEX) 0.12 % solution 15 mL, 15 mL, Mouth Rinse, BID, Cherene Altes, MD, 15 mL at 10/23/15 1105 .  cholecalciferol (VITAMIN D) tablet 1,000 Units, 1,000 Units, Oral, BID, Allie Bossier, MD, 1,000 Units at 10/23/15 1100 .  fluconazole (DIFLUCAN) tablet 100 mg, 100 mg,  Oral, Daily, Cherene Altes, MD, 100 mg at 10/23/15 1100 .  haloperidol lactate (HALDOL) injection 2 mg, 2 mg, Intravenous, Q6H PRN, Robbie Lis, MD, 2 mg at 10/23/15 1220 .  hydrALAZINE (APRESOLINE) tablet 25 mg, 25 mg, Oral, Q6H, Cherene Altes, MD, 25 mg at 10/23/15 1217 .  isosorbide mononitrate (IMDUR) 24 hr tablet 60 mg, 60 mg, Oral, Daily, Allie Bossier, MD, 60 mg at 10/23/15 1100 .  levothyroxine (SYNTHROID, LEVOTHROID) tablet 137 mcg, 137 mcg, Oral, QAC breakfast, Cherene Altes, MD, 137 mcg at 10/23/15 [phone]  LORazepam (ATIVAN) injection 1 mg, 1 mg, Intravenous, Q4H PRN, Robbie Lis, MD .  magnesium citrate solution 0.5 Bottle, 0.5 Bottle, Oral, Once PRN, Cherene Altes, MD .  metoprolol (LOPRESSOR) tablet 50 mg, 50 mg, Oral, BID, Cherene Altes, MD, 50 mg at 10/23/15 1059 .  mometasone-formoterol (DULERA) 100-5 MCG/ACT inhaler 2 puff, 2 puff, Inhalation, BID, Allie Bossier, MD, 2 puff at 10/23/15 [phone]  ondansetron (ZOFRAN) injection 4 mg, 4 mg, Intravenous, Q6H PRN, Allie Bossier, MD, 4 mg at 10/18/15 0659 .  pantoprazole (PROTONIX) EC tablet 40 mg, 40 mg, Oral, BID, Cherene Altes, MD, 40 mg at 10/23/15 1103 .  polyethylene glycol (MIRALAX / GLYCOLAX) packet 17 g, 17 g, Oral, Daily, Dianne Dun, NP, 17 g at 10/22/15 0942 .  prednisoLONE acetate (PRED FORTE) 1 % ophthalmic suspension 1 drop, 1 drop, Both Eyes, Daily PRN, Allie Bossier, MD, 1 drop at 10/23/15 1056 .  QUEtiapine (SEROQUEL) tablet 12.5 mg, 12.5 mg, Oral, QHS, Cherene Altes, MD, 12.5 mg at 10/22/15 2327 .  simethicone (MYLICON) 40 [id number] suspension 40 mg, 40 mg, Oral, Q6H PRN, Cherene Altes, MD .  sodium chloride flush (NS) 0.9 % injection 10-40 mL, 10-40 mL, Intracatheter, PRN, Allie Bossier, MD, 10 mL at 10/23/15 1231 .  timolol (TIMOPTIC) 0.5 % ophthalmic solution 1 drop, 1 drop, Both Eyes, BID, Allie Bossier, MD, 1 drop at 10/23/15 1104 .  Warfarin - Pharmacist Dosing  Inpatient, , Does not apply, q1800, Jaquita Folds, RPH, Stopped at 10/22/15 1800   ROS:  History    unobtainable from patient due to mental status    Neurologic Examination:                                                                                                    Today's Vitals   10/23/15 0745 10/23/15 0825 10/23/15 1201 10/23/15 1409  BP:    140/46  Pulse:    72  Temp:    99.3 F (37.4 C)  TempSrc:    Oral  Resp:      Height:      Weight:      SpO2:  98%    PainSc: 0-No pain  Asleep    Very drowsy, opens eyes to tactile stimulus, does not follow commands. Withdraws in all 4 extremities. Has some mild rigidity in all extremities. No gaze deviation or involuntary movements noted not cooperative with exam.  Lab Results: Basic Metabolic Panel:  Recent Labs Lab 10/17/15 0510 10/19/15 0427 10/22/15 0515 10/23/15 0450  NA 140 140 139 137  K 3.5 3.4* 3.7 4.2  CL 95* 100* 98* 97*  CO2 31 30 25 27   GLUCOSE 100* 104* 91 111*  BUN 66* 54* 56* 60*  CREATININE 2.85* 2.56* 3.15* 3.96*  CALCIUM 8.8* 8.8* 8.8* 8.4*    Liver Function Tests:  Recent Labs Lab 10/17/15 0510 10/19/15 0427  AST 35 33  ALT 21 22  ALKPHOS 107 116  BILITOT 2.4* 1.6*  PROT 6.0* 5.6*  ALBUMIN 2.5* 2.5*   No results for input(s): LIPASE, AMYLASE in the last 168 hours. No results for input(s): AMMONIA in the last 168 hours.  CBC:  Recent Labs Lab 10/17/15 0510 10/19/15 0427 10/20/15 0636 10/22/15 0515 10/23/15 0450  WBC 7.7 6.8 7.1 7.7 8.2  HGB 9.2* 8.6* 9.2* 9.3* 9.3*  HCT 33.8* 31.8* 33.4* 33.5* 33.5*  MCV 79.0 80.1 81.1 79.6 79.8  PLT 337 364 382 423* 443*    Cardiac Enzymes: No results for input(s): CKTOTAL, CKMB, CKMBINDEX, TROPONINI in the last 168 hours.  Lipid Panel: No results for input(s): CHOL, TRIG, HDL, CHOLHDL, VLDL, LDLCALC in  the last 168 hours.  CBG: No results for input(s): GLUCAP in the last 168 hours.  Microbiology: Results for orders placed or performed during the hospital encounter of 10/05/15  Urine culture     Status: Abnormal   Collection Time: 10/05/15 12:33 PM  Result Value Ref Range Status   Specimen Description URINE, CATHETERIZED  Final   Special Requests NONE  Final   Culture (A)  Final    >=100,000 COLONIES/mL GROUP B STREP(S.AGALACTIAE)ISOLATED TESTING AGAINST S. AGALACTIAE NOT ROUTINELY PERFORMED DUE TO PREDICTABILITY OF AMP/PEN/VAN SUSCEPTIBILITY.    Report Status 10/06/2015 FINAL  Final  MRSA PCR Screening     Status: None   Collection Time: 10/05/15  5:38 PM  Result Value Ref Range Status   MRSA by PCR NEGATIVE NEGATIVE Final    Comment:        The GeneXpert MRSA Assay (FDA approved for NASAL specimens only), is one component of a comprehensive MRSA colonization surveillance program. It is  not intended to diagnose MRSA infection nor to guide or monitor treatment for MRSA infections.   Culture, blood (routine x 2)     Status: None   Collection Time: 10/13/15  9:08 AM  Result Value Ref Range Status   Specimen Description BLOOD LEFT ARM  Final   Special Requests BOTTLES DRAWN AEROBIC ONLY 5CC  Final   Culture NO GROWTH 5 DAYS  Final   Report Status 10/18/2015 FINAL  Final  Culture, blood (routine x 2)     Status: Abnormal   Collection Time: 10/13/15  9:15 AM  Result Value Ref Range Status   Specimen Description BLOOD RIGHT HAND  Final   Special Requests BOTTLES DRAWN AEROBIC ONLY 10CC  Final   Culture  Setup Time   Final    GRAM POSITIVE COCCI IN CLUSTERS AEROBIC BOTTLE ONLY CRITICAL RESULT CALLED TO, READ BACK BY AND VERIFIED WITH: V BRYK,PHARMD @0646  09/2315 MKELLY    Culture (A)  Final    STAPHYLOCOCCUS SPECIES (COAGULASE NEGATIVE) THE SIGNIFICANCE OF ISOLATING THIS ORGANISM FROM A SINGLE SET OF BLOOD CULTURES WHEN MULTIPLE SETS ARE DRAWN IS UNCERTAIN. PLEASE NOTIFY  THE MICROBIOLOGY DEPARTMENT WITHIN ONE WEEK IF SPECIATION AND SENSITIVITIES ARE REQUIRED.    Report Status 10/17/2015 FINAL  Final  Blood Culture ID Panel (Reflexed)     Status: Abnormal   Collection Time: 10/13/15  9:15 AM  Result Value Ref Range Status   Enterococcus species NOT DETECTED NOT DETECTED Final   Vancomycin resistance NOT DETECTED NOT DETECTED Final   Listeria monocytogenes NOT DETECTED NOT DETECTED Final   Staphylococcus species DETECTED (A) NOT DETECTED Final    Comment: CRITICAL RESULT CALLED TO, READ BACK BY AND VERIFIED WITH: V BRYK,PHARMD @0646  10/15/15 MKELLY    Staphylococcus aureus NOT DETECTED NOT DETECTED Final   Methicillin resistance DETECTED (A) NOT DETECTED Final    Comment: CRITICAL RESULT CALLED TO, READ BACK BY AND VERIFIED WITH: V BRYK,PHARMD @0646  10/15/15 MKELLY    Streptococcus species NOT DETECTED NOT DETECTED Final   Streptococcus agalactiae NOT DETECTED NOT DETECTED Final   Streptococcus pneumoniae NOT DETECTED NOT DETECTED Final   Streptococcus pyogenes NOT DETECTED NOT DETECTED Final   Acinetobacter baumannii NOT DETECTED NOT DETECTED Final   Enterobacteriaceae species NOT DETECTED NOT DETECTED Final   Enterobacter cloacae complex NOT DETECTED NOT DETECTED Final   Escherichia coli NOT DETECTED NOT DETECTED Final   Klebsiella oxytoca NOT DETECTED NOT DETECTED Final   Klebsiella pneumoniae NOT DETECTED NOT DETECTED Final   Proteus species NOT DETECTED NOT DETECTED Final   Serratia marcescens NOT DETECTED NOT DETECTED Final   Carbapenem resistance NOT DETECTED NOT DETECTED Final   Haemophilus influenzae NOT DETECTED NOT DETECTED Final   Neisseria meningitidis NOT DETECTED NOT DETECTED Final   Pseudomonas aeruginosa NOT DETECTED NOT DETECTED Final   Candida albicans NOT DETECTED NOT DETECTED Final   Candida glabrata NOT DETECTED NOT DETECTED Final   Candida krusei NOT DETECTED NOT DETECTED Final   Candida parapsilosis NOT DETECTED NOT DETECTED  Final   Candida tropicalis NOT DETECTED NOT DETECTED Final  Culture, Urine     Status: Abnormal   Collection Time: 10/13/15  5:19 PM  Result Value Ref Range Status   Specimen Description URINE, CATHETERIZED  Final   Special Requests NONE  Final   Culture >=100,000 COLONIES/mL YEAST (A)  Final   Report Status 10/14/2015 FINAL  Final  Culture, blood (routine x 2)     Status: None   Collection Time:  10/16/15  3:52 PM  Result Value Ref Range Status   Specimen Description BLOOD RIGHT HAND  Final   Special Requests IN PEDIATRIC BOTTLE 3CC  Final   Culture NO GROWTH 5 DAYS  Final   Report Status 10/21/2015 FINAL  Final  Culture, blood (routine x 2)     Status: None   Collection Time: 10/16/15  3:57 PM  Result Value Ref Range Status   Specimen Description BLOOD RIGHT HAND  Final   Special Requests IN PEDIATRIC BOTTLE 3CC  Final   Culture NO GROWTH 5 DAYS  Final   Report Status 10/21/2015 FINAL  Final     Imaging: No results found.  Assessment and plan:   Wendy Cole is an 80 y.o. female patient with toxic metabolic encephalopathy secondary to bacteremia multiple metabolic abnormalities. Previous EEG from 10/14/2015 showed evidence of moderate to severe encephalopathy with background slowing In delta to mid theta range, no seizures. He MRI of the brain did not show any acute pathology to explain this continued status.  I reviewed the MRI images with husband and her son, and vitamins. Moderate to severe bifrontal parietal cortical atrophy is noted. They reported that patient has been having cognitive impairment gradually over the past several months, and also had gait dysfunction. I suspect that at baseline she has some underlying dementia which is likely responsible for prolonged encephalopathic state despite gradual improvement of her toxic metabolic pathology. Recommend repeat EEG tomorrow morning to further evaluate for severity of encephalopathy. Follow-up after completing the  EEG.

## 2015-10-23 NOTE — Progress Notes (Signed)
ANTICOAGULATION CONSULT NOTE - Follow Up Consult  Pharmacy Consult for Coumadin Indication: atrial fibrillation  Allergies  Allergen Reactions  . Amlodipine     edema  . Atorvastatin Other (See Comments)    Muscle aches  . Colesevelam Other (See Comments)    unknown  . Coumadin [Warfarin Sodium]     "causes cramps" reaction to brand name only per pt  . Lasix [Furosemide] Other (See Comments)    Muscle cramps  . Statins Other (See Comments)    Muscle aches  . Tape Rash    Patient Measurements: Height: 5\' 6"  (167.6 cm) Weight: 191 lb (86.637 kg) IBW/kg (Calculated) : 59.3   Vital Signs: Temp: 98.7 F (37.1 C) (05/31 0428) Temp Source: Axillary (05/31 0428) BP: 162/45 mmHg (05/31 0428) Pulse Rate: 76 (05/31 0428)  Labs:  Recent Labs  10/21/15 0604 10/22/15 0515 10/23/15 0450  HGB  --  9.3* 9.3*  HCT  --  33.5* 33.5*  PLT  --  423* 443*  LABPROT 33.8* 35.3* 36.0*  INR 3.42* 3.63* 3.73*  CREATININE  --  3.15* 3.96*    Estimated Creatinine Clearance: 13.2 mL/min (by C-G formula based on Cr of 3.96).   Medications:  Scheduled:  . allopurinol  100 mg Oral Daily  . antiseptic oral rinse  7 mL Mouth Rinse q12n4p  . aspirin EC  81 mg Oral Daily  . brimonidine  1 drop Both Eyes BID  . buPROPion  150 mg Oral Daily  . chlorhexidine  15 mL Mouth Rinse BID  . cholecalciferol  1,000 Units Oral BID  . fluconazole  100 mg Oral Daily  . hydrALAZINE  25 mg Oral Q6H  . isosorbide mononitrate  60 mg Oral Daily  . levothyroxine  137 mcg Oral QAC breakfast  . metoprolol tartrate  50 mg Oral BID  . mometasone-formoterol  2 puff Inhalation BID  . pantoprazole  40 mg Oral BID  . polyethylene glycol  17 g Oral Daily  . QUEtiapine  12.5 mg Oral QHS  . timolol  1 drop Both Eyes BID  . Warfarin - Pharmacist Dosing Inpatient   Does not apply q1800    Assessment: 80yo female on Coumadin 3mg  daily PTA for hx of DVT but last anticoag visit on 5/12 showed an INR of 6.7 so was  holding dose. Resumed 5/24 at a lower dose. Lower GIB on admit with INR 5.13.  INR continues to trend up to 3.73. Dose held past two days. Would expect INR to start trending back down tomorrow and could consider restarting then. Hgb stable at 9.3, plts 443. Fluconazole may elevate the INR  Goal of Therapy:  INR 2-3 Monitor platelets by anticoagulation protocol: Yes  Plan:  No Coumadin today Monitor daily INR, CBC, s/s of bleed  Elenor Quinones, PharmD, BCPS Clinical Pharmacist Pager [phone] 10/23/2015 12:30 PM

## 2015-10-23 NOTE — Progress Notes (Addendum)
Initial Nutrition Assessment  DOCUMENTATION CODES:   Obesity unspecified  INTERVENTION:   -Magic Cup TID with meals  -If pt continues to be unable to consume adequate nutrition via PO route, recommend:  Initiate Jevity 1.2 @ 25 ml/hr and increase by 10 ml every 4 hours to goal rate of 65 ml/hr.   Tube feeding regimen provides 1872 kcal (100% of needs), 87 grams of protein, and 1259 ml of H2O.   NUTRITION DIAGNOSIS:   Inadequate oral intake related to lethargy/confusion as evidenced by meal completion < 50%.  GOAL:   Patient will meet greater than or equal to 90% of their needs  MONITOR:   PO intake, Labs, Weight trends, Skin, I & O's  REASON FOR ASSESSMENT:   Low Braden    ASSESSMENT:   80 year old female with past medical history significant for CVA, CAD, paroxysmal atrial fibrillation on anticoagulation with Coumadin, chronic severe diastolic congestive heart failure, chronic kidney disease stage III, hypothyroidism, dyslipidemia, DVT, obstructive sleep apnea, breast cancer status post right mastectomy.   Pt admitted with obtundation and toxic metabolic encephalopathy.   Pt moaning out "help me" and "Poppy!" at time of visit. Pt unable to provide hx secondary to mental status. No family present at time of visit.   Meal intake has been very poor over the past 6 days. Noted meal completion 0-40%, with multiple refusals. Spoke with RN and nurse tech; pt is able to consume pills crushed in applesauce, but other PO intake is very limited. Noted breakfast tray of pancake at bedside untouched. RN and nurse tech report pt spits food out when attempting to feed her.   Wt hx reviewed. Noted a 6.8% wt loss over the past 3 months. Pt currently receiving turosemide, which may explain weight loss during hospitalization.   Nutrition-Focused physical exam completed. Findings are no fat depletion, no muscle depletion, and mild edema.   Pt is at risk for malnutrition secondary to  prolonged PO intake. May need to consider initiation of nutrition support if pt's mental status does not improve.   Labs reviewed.   Diet Order:  Diet Heart Room service appropriate?: Yes; Fluid consistency:: Thin  Skin:  Reviewed, no issues  Last BM:  10/22/15  Height:   Ht Readings from Last 1 Encounters:  10/05/15 5\' 6"  (1.676 m)    Weight:   Wt Readings from Last 1 Encounters:  10/23/15 191 lb (86.637 kg)    Ideal Body Weight:  59.1 kg  BMI:  Body mass index is 30.84 kg/(m^2).  Estimated Nutritional Needs:   Kcal:  1700-1900  Protein:  85-100 grams  Fluid:  1.7-1.9 L  EDUCATION NEEDS:   No education needs identified at this time  Wendy Cole A. Jimmye Norman, RD, LDN, CDE Pager: [phone] After hours Pager: [phone]

## 2015-10-23 NOTE — Care Management Important Message (Signed)
Important Message  Patient Details  Name: Wendy Cole MRN: [id number] Date of Birth: 09-02-1935   Medicare Important Message Given:  Yes    Loann Quill 10/23/2015, 9:29 AM

## 2015-10-23 NOTE — Clinical Social Work Note (Signed)
Patient is still interested in going to [facility] for short term rehab, CSW continuing to follow patient's progress.  Jones Broom. [place], MSW, [place] 10/23/2015 6:06 PM

## 2015-10-23 NOTE — Progress Notes (Addendum)
PROGRESS NOTE    Wendy Cole  [id number] DOB: 05-15-1936 DOA: 10/05/2015 PCP: Walker Kehr, MD   Brief Narrative:  HPI On 10/05/2015 by Dr. Dia Crawford Wendy Cole is a 80 y.o. WF PMHx Anxiety, Depression,CVA, CAD native artery, Paroxysmal Atrial Fibrillation on Warfarin Rx, Chronic Diastolic CHF/Cardiomyopathy, NSTEMI, HTN, Stage III CKD, Hypothyroid, OSA and hx of Breast Cancer S/P Rt. Mastectomy HLD, DVT,  Who presents to the ED , presents to the Emergency Department today complaining of BLE swelling and shortness of breath. Noted increasing weakness since DC from hospital on 07-30-15 due to syncopal episode. Seen Neurology on 09-05-15 for follow up with no neurologic cause. Pt notes inability to ambulate due to weakness and called EMS as she was unable to get off of the toilet. Started using wheelchair in the past week due to weakness. Pt also notes right shoulder pain s/p mechanical fall earlier this week with no head trauma. Notes pain is 5/10 and aching. Pt has Chest tightness with SOB. No CP. No N/V/D. No headaches. No fevers. No hematochezia. No black tarry stools. No other symptoms noted.  Occult blood positive In ED. Patient has not noticed blood in urine or Stool.states allergic to Lasix will cause her extreme muscle spasms ~ 30-45 minutes post administration..states baseline weight is ~218 pounds(99 kg). States does not weigh herself daily  Assessment & Plan  Obtundation / Toxic metabolic encephalopathy / yeast UTI / Agitation / Psychosis  -Probably related to UTI, urine culture grew yeast and she is treated with fluconazole -B12, folate, and ammonia recently normal  -CT and MRI brain negative for acute intracranial abnormality   -Patient was seen by psychiatry and their thought is that patient may have had steroid induced delirium but currently she is off of steroids and mental status still not improved -Continue Seroquel 12.5 mg at bedtime -Ativan and haldol  PRN for restlessness and / or agitation  -Had long conversation with husband today about mental status.  Likely unrelated to steroids.  Repeat Blood cultures negative.  Possibly due to UTI, which is being treated. Brain imaging shows no acute intracranial abnormality. Husband was frustrated that we do not know what the cause of her AMS is.  He feels it is medication related. -Neurology consulted and appreciated.   Staph agalactiae UTI / Coagulase negative bacteremia  -Has completed a course of rocephin - no sx of ongoing infection  -Please note blood cultures with staph species coagulase negative which suggests contamination  Occult GI bleed / acute blood loss anemia in the setting of supratherapeutic INR -Per prior documentation, patient's husband did not want a GI evaluation at this time -Patient has received 3 units of PRBC transfusion during this hospital stay -No reports of bleeding while on Coumadin  -Off note, she was given vitamin K on admission for supratherapeutic INR which will corrected INR  Acute exacerbation of chronic grade 2 diastolic CHF -Baseline weight 93 kg 07/27/2015  -Weight in past 48 hours: 84.8 kg --> 83.9 kg . Today 86.6kg -Diuretics held due to worsening renal function -Monitor intake/output, daily weights -Patient does have incontinence.   Acute on Chronic kidney disease stage IV -Creatinine at 2.56 on 10/19/2015  -Cr today 3.96, torsemide was discontinued on 5/30, will discontinue metolazone today -Given gentle IVF  -Continue to monitor BMP -Will consult nephrology, if no improvement  Paroxysmal Atrial fibrillation -CHADS vasc score 4 -Per patient's husband, wanted to resume Coumadin so this was done and so far tolerates well  -  INR 3.63, dosing per pharmacy  -Rate controlled with metoprolol   Essential hypertension -Continue metoprolol, imdur, hydralazine   OSA -CPAP QHS   Hypothyroidism -Continue Synthroid  -TSH normal 10/05/15  History  of DVT -Most recent duplex in 2014 was negative for DVT -LE doppler 5/16 - no DVT  Obesity  -Body mass index is 30.68 kg/(m^2).  DVT Prophylaxis  Coumadin  Code Status: DNR  Family Communication: Husband via phone.  Disposition Plan: Admitted. Pending SNF once Cc improves  Consultants Cardiology Gastroenterology  Procedures  LE doppler 10/08/15, neg for DVT PICC placed on 10/05/15  Antibiotics   Anti-infectives    Start     Dose/Rate Route Frequency Ordered Stop   10/19/15 1000  fluconazole (DIFLUCAN) tablet 100 mg     100 mg Oral Daily 10/18/15 1632     10/17/15 1100  fluconazole (DIFLUCAN) IVPB 100 mg  Status:  Discontinued     100 mg 50 mL/hr over 60 Minutes Intravenous Every 24 hours 10/17/15 0841 10/18/15 1632   10/15/15 1100  fluconazole (DIFLUCAN) IVPB 200 mg  Status:  Discontinued     200 mg 100 mL/hr over 60 Minutes Intravenous Every 24 hours 10/15/15 1034 10/17/15 0841   10/06/15 0915  cefTRIAXone (ROCEPHIN) 1 g in dextrose 5 % 50 mL IVPB     1 g 100 mL/hr over 30 Minutes Intravenous Every 24 hours 10/06/15 0913 10/11/15 1020      Subjective:   Wendy Cole seen and examined today.  No complaints today. Very sleepy.  Objective:   Filed Vitals:   10/22/15 1417 10/22/15 2246 10/23/15 0428 10/23/15 0825  BP: 130/63 156/65 162/45   Pulse: 82 77 76   Temp: 100.1 F (37.8 C) 99.5 F (37.5 C) 98.7 F (37.1 C)   TempSrc: Axillary Axillary Axillary   Resp: 19     Height:      Weight:   86.637 kg (191 lb)   SpO2: 95% 96% 98% 98%    Intake/Output Summary (Last 24 hours) at 10/23/15 1253 Last data filed at 10/23/15 1115  Gross per 24 hour  Intake    340 ml  Output      0 ml  Net    340 ml   Filed Weights   10/21/15 0500 10/22/15 0541 10/23/15 0428  Weight: 84.823 kg (187 lb) 83.9 kg (184 lb 15.5 oz) 86.637 kg (191 lb)    Exam  General: Well developed, well nourished, NAD  HEENT: NCAT, mucous membranes moist.   Cardiovascular: S1 S2  auscultated, 2/6 SEM, RRR  Respiratory: Clear to auscultation bilaterally   Abdomen: Soft, nontender, nondistended, + bowel sounds  Extremities: warm dry without cyanosis clubbing or edema  Data Reviewed: I have personally reviewed following labs and imaging studies  CBC:  Recent Labs Lab 10/17/15 0510 10/19/15 0427 10/20/15 0636 10/22/15 0515 10/23/15 0450  WBC 7.7 6.8 7.1 7.7 8.2  HGB 9.2* 8.6* 9.2* 9.3* 9.3*  HCT 33.8* 31.8* 33.4* 33.5* 33.5*  MCV 79.0 80.1 81.1 79.6 79.8  PLT 337 364 382 423* 123456*   Basic Metabolic Panel:  Recent Labs Lab 10/17/15 0510 10/19/15 0427 10/22/15 0515 10/23/15 0450  NA 140 140 139 137  K 3.5 3.4* 3.7 4.2  CL 95* 100* 98* 97*  CO2 31 30 25 27   GLUCOSE 100* 104* 91 111*  BUN 66* 54* 56* 60*  CREATININE 2.85* 2.56* 3.15* 3.96*  CALCIUM 8.8* 8.8* 8.8* 8.4*   GFR: Estimated Creatinine Clearance: 13.2 mL/min (by  C-G formula based on Cr of 3.96). Liver Function Tests:  Recent Labs Lab 10/17/15 0510 10/19/15 0427  AST 35 33  ALT 21 22  ALKPHOS 107 116  BILITOT 2.4* 1.6*  PROT 6.0* 5.6*  ALBUMIN 2.5* 2.5*   No results for input(s): LIPASE, AMYLASE in the last 168 hours. No results for input(s): AMMONIA in the last 168 hours. Coagulation Profile:  Recent Labs Lab 10/19/15 0427 10/20/15 0636 10/21/15 0604 10/22/15 0515 10/23/15 0450  INR 2.58* 2.98* 3.42* 3.63* 3.73*   Cardiac Enzymes: No results for input(s): CKTOTAL, CKMB, CKMBINDEX, TROPONINI in the last 168 hours. BNP (last 3 results)  Recent Labs  07/22/15 1503  PROBNP >5000.0*   HbA1C: No results for input(s): HGBA1C in the last 72 hours. CBG: No results for input(s): GLUCAP in the last 168 hours. Lipid Profile: No results for input(s): CHOL, HDL, LDLCALC, TRIG, CHOLHDL, LDLDIRECT in the last 72 hours. Thyroid Function Tests: No results for input(s): TSH, T4TOTAL, FREET4, T3FREE, THYROIDAB in the last 72 hours. Anemia Panel: No results for input(s):  VITAMINB12, FOLATE, FERRITIN, TIBC, IRON, RETICCTPCT in the last 72 hours. Urine analysis:    Component Value Date/Time   COLORURINE YELLOW 10/05/2015 1233   APPEARANCEUR TURBID* 10/05/2015 1233   LABSPEC 1.019 10/05/2015 1233   LABSPEC 1.010 06/21/2013 1213   PHURINE 5.0 10/05/2015 1233   PHURINE 6.0 06/21/2013 1213   GLUCOSEU NEGATIVE 10/05/2015 1233   GLUCOSEU NEGATIVE 03/20/2014 1454   GLUCOSEU Negative 06/21/2013 1213   HGBUR LARGE* 10/05/2015 1233   HGBUR Negative 06/21/2013 1213   BILIRUBINUR NEGATIVE 10/05/2015 1233   BILIRUBINUR Negative 06/21/2013 1213   KETONESUR NEGATIVE 10/05/2015 1233   KETONESUR Negative 06/21/2013 1213   PROTEINUR 100* 10/05/2015 1233   PROTEINUR 100 06/21/2013 1213   UROBILINOGEN 0.2 03/20/2014 1454   UROBILINOGEN 0.2 06/21/2013 1213   NITRITE NEGATIVE 10/05/2015 1233   NITRITE Negative 06/21/2013 1213   LEUKOCYTESUR LARGE* 10/05/2015 1233   LEUKOCYTESUR Negative 06/21/2013 1213   Sepsis Labs: @LABRCNTIP (procalcitonin:4,lacticidven:4)  ) Recent Results (from the past 240 hour(s))  Culture, Urine     Status: Abnormal   Collection Time: 10/13/15  5:19 PM  Result Value Ref Range Status   Specimen Description URINE, CATHETERIZED  Final   Special Requests NONE  Final   Culture >=100,000 COLONIES/mL YEAST (A)  Final   Report Status 10/14/2015 FINAL  Final  Culture, blood (routine x 2)     Status: None   Collection Time: 10/16/15  3:52 PM  Result Value Ref Range Status   Specimen Description BLOOD RIGHT HAND  Final   Special Requests IN PEDIATRIC BOTTLE 3CC  Final   Culture NO GROWTH 5 DAYS  Final   Report Status 10/21/2015 FINAL  Final  Culture, blood (routine x 2)     Status: None   Collection Time: 10/16/15  3:57 PM  Result Value Ref Range Status   Specimen Description BLOOD RIGHT HAND  Final   Special Requests IN PEDIATRIC BOTTLE 3CC  Final   Culture NO GROWTH 5 DAYS  Final   Report Status 10/21/2015 FINAL  Final      Radiology  Studies: No results found.   Scheduled Meds: . allopurinol  100 mg Oral Daily  . antiseptic oral rinse  7 mL Mouth Rinse q12n4p  . aspirin EC  81 mg Oral Daily  . brimonidine  1 drop Both Eyes BID  . buPROPion  150 mg Oral Daily  . chlorhexidine  15 mL Mouth Rinse  BID  . cholecalciferol  1,000 Units Oral BID  . fluconazole  100 mg Oral Daily  . hydrALAZINE  25 mg Oral Q6H  . isosorbide mononitrate  60 mg Oral Daily  . levothyroxine  137 mcg Oral QAC breakfast  . metoprolol tartrate  50 mg Oral BID  . mometasone-formoterol  2 puff Inhalation BID  . pantoprazole  40 mg Oral BID  . polyethylene glycol  17 g Oral Daily  . QUEtiapine  12.5 mg Oral QHS  . timolol  1 drop Both Eyes BID  . Warfarin - Pharmacist Dosing Inpatient   Does not apply q1800   Continuous Infusions: . sodium chloride 75 mL/hr at 10/23/15 0954     LOS: 18 days   Time Spent in minutes   30 minutes  Jak Haggar D.O. on 10/23/2015 at 12:53 PM  Between 7am to 7pm - Pager - [phone]  After 7pm go to [email or web address] - password [email or web address]  And look for the night coverage person covering for me after hours  [facility] Office  [phone]

## 2015-10-24 ENCOUNTER — Inpatient Hospital Stay (HOSPITAL_COMMUNITY): Payer: Medicare Other

## 2015-10-24 DIAGNOSIS — I480 Paroxysmal atrial fibrillation: Secondary | ICD-10-CM

## 2015-10-24 DIAGNOSIS — E038 Other specified hypothyroidism: Secondary | ICD-10-CM

## 2015-10-24 DIAGNOSIS — N390 Urinary tract infection, site not specified: Secondary | ICD-10-CM

## 2015-10-24 DIAGNOSIS — Z7901 Long term (current) use of anticoagulants: Secondary | ICD-10-CM

## 2015-10-24 DIAGNOSIS — G9340 Encephalopathy, unspecified: Secondary | ICD-10-CM

## 2015-10-24 DIAGNOSIS — R600 Localized edema: Secondary | ICD-10-CM

## 2015-10-24 DIAGNOSIS — R404 Transient alteration of awareness: Secondary | ICD-10-CM

## 2015-10-24 DIAGNOSIS — E785 Hyperlipidemia, unspecified: Secondary | ICD-10-CM

## 2015-10-24 DIAGNOSIS — R791 Abnormal coagulation profile: Secondary | ICD-10-CM

## 2015-10-24 LAB — PROTIME-INR
INR: 4.17 — AB (ref 0.00–1.49)
PROTHROMBIN TIME: 39.2 s — AB (ref 11.6–15.2)

## 2015-10-24 LAB — BASIC METABOLIC PANEL
Anion gap: 11 (ref 5–15)
BUN: 61 mg/dL — AB (ref 6–20)
CALCIUM: 7.5 mg/dL — AB (ref 8.9–10.3)
CO2: 24 mmol/L (ref 22–32)
CREATININE: 3.36 mg/dL — AB (ref 0.44–1.00)
Chloride: 97 mmol/L — ABNORMAL LOW (ref 101–111)
GFR calc Af Amer: 14 mL/min — ABNORMAL LOW (ref >60)
GFR calc non Af Amer: 12 mL/min — ABNORMAL LOW (ref >60)
GLUCOSE: 85 mg/dL (ref 65–99)
Potassium: 4.1 mmol/L (ref 3.5–5.1)
Sodium: 132 mmol/L — ABNORMAL LOW (ref 135–145)

## 2015-10-24 LAB — CBC
HCT: 29.7 % — ABNORMAL LOW (ref 36.0–46.0)
Hemoglobin: 8.3 g/dL — ABNORMAL LOW (ref 12.0–15.0)
MCH: 22 pg — AB (ref 26.0–34.0)
MCHC: 27.9 g/dL — AB (ref 30.0–36.0)
MCV: 78.8 fL (ref 78.0–100.0)
PLATELETS: 439 10*3/uL — AB (ref 150–400)
RBC: 3.77 MIL/uL — ABNORMAL LOW (ref 3.87–5.11)
RDW: 24.8 % — AB (ref 11.5–15.5)
WBC: 7.3 10*3/uL (ref 4.0–10.5)

## 2015-10-24 NOTE — Progress Notes (Signed)
Occupational Therapy Treatment Patient Details Name: Wendy Cole MRN: [id number] DOB: Feb 23, 1936 Today's Date: 10/24/2015    History of present illness This 80 y.o. female was brought to ED when she was unable to get up from the toilet.  In ED pt was grossly volume overloaded and had Guiac + stool.  Hgb 5.8 and INR 5.8.  Dx: UTI, diastolic HF exacerbation, GI bleed, OSA, paroxysmal A-Fib, toxic metabolic encephalopathy w/ increasing confusion likely UTI contributing along w/ steroid induced delirium.[name]  PMH includes:  Anxiety/depression, A-Fib, chronic severe diastolic CHF, HTN, stage III CKD, h/o DVT, OSA, Breast CA, s/p Rt mastectomy.    OT comments  More alert this session; pt needs extensive to total assistance for all activities  Follow Up Recommendations  SNF    Equipment Recommendations  Hospital bed (not ready for 3:1 commode yet)    Recommendations for Other Services      Precautions / Restrictions Precautions Precautions: Fall Restrictions Weight Bearing Restrictions: No       Mobility Bed Mobility   Bed Mobility: Supine to Sit Rolling: Total assist   Supine to sit: Total assist;+2 for physical assistance Sit to supine: Total assist;+2 for physical assistance   General bed mobility comments: pt performed 10% of rolling and sitting up  Transfers                 General transfer comment: not attempted today    Balance                                   ADL       Grooming:  (see below)                                 General ADL Comments: pt washed face with max A.  She required total assistance:  hand over hand, to rub lotion on hands and attempt to brush teeth.  Pt took one sip of juice when placed in her hands.  Pt was saturated with urine. rolled to bil sides to clean her and change paper chux pads      Vision                     Perception     Praxis      Cognition   Behavior During Therapy:   (yelled at times )                    General Comments: followed some commands but not consistently, delayed responses    Extremity/Trunk Assessment               Exercises     Shoulder Instructions       General Comments      Pertinent Vitals/ Pain       Pain Assessment: Faces Faces Pain Scale: Hurts a little bit Pain Location: "I don't know" Pain Descriptors / Indicators: Moaning;Grimacing Pain Intervention(s): Limited activity within patient's tolerance;Monitored during session;Premedicated before session  Home Living                                          Prior Functioning/Environment              Frequency  Min 2X/week     Progress Toward Goals  OT Goals(current goals can now be found in the care plan section)  Progress towards OT goals: Progressing toward goals (slowly)  Acute Rehab OT Goals Patient Stated Goal: none stated; confused  Plan      Co-evaluation    PT/OT/SLP Co-Evaluation/Treatment: Yes Reason for Co-Treatment: For patient/therapist safety PT goals addressed during session: Mobility/safety with mobility OT goals addressed during session: ADL's and self-care      End of Session     Activity Tolerance Patient tolerated treatment well   Patient Left in bed;with call bell/phone within reach;with bed alarm set   Nurse Communication          Time: 1202-1226 OT Time Calculation (min): 24 min  Charges: OT General Charges $OT Visit: 1 Procedure OT Treatments $Self Care/Home Management : 8-22 mins  Oleg Oleson 10/24/2015, 1:09 PM  Lesle Chris, OTR/L [phone] 10/24/2015

## 2015-10-24 NOTE — Progress Notes (Signed)
Subjective: No further seizures.   Exam: Filed Vitals:   10/24/15 0533 10/24/15 1443  BP: 159/48 144/47  Pulse: 79 73  Temp: 98.8 F (37.1 C) 98.6 F (37 C)  Resp: 20         Gen: In bed, NAD MS: alert CN: 2-12 intact Motor: moving all extremities   Pertinent Labs/Diagnostics: EEG:  Impression:  Abnormal routine inpatient EEG suggestive of moderate encephalopathy.   Etta Quill PA-C [facility] [phone]  Impression:  80 YO female with toxic/metabolic encephalopathy and EEG showing no epileptiform activity.  MRI shows atrophy but no infarction.  She continues to have elevated Cr, now hyponatremic, INR 4.17 and UC shows yeast.   As far as Dementia that is something that will need to be tested as a out patient basis when no infection is present and out of hospital. Would continue to treat underlying infection. At this time no further recommendations. Would recommend she follow up as out patient with neurology.   Neurology will S/O Etta Quill PA-C [facility] [phone]  10/24/2015, 3:49 PM

## 2015-10-24 NOTE — Progress Notes (Signed)
ANTICOAGULATION CONSULT NOTE - Follow Up Consult  Pharmacy Consult for Coumadin Indication: atrial fibrillation  Allergies  Allergen Reactions  . Amlodipine     edema  . Atorvastatin Other (See Comments)    Muscle aches  . Colesevelam Other (See Comments)    unknown  . Coumadin [Warfarin Sodium]     "causes cramps" reaction to brand name only per pt  . Lasix [Furosemide] Other (See Comments)    Muscle cramps  . Statins Other (See Comments)    Muscle aches  . Tape Rash    Patient Measurements: Height: 5\' 6"  (167.6 cm) Weight: 186 lb (84.369 kg) IBW/kg (Calculated) : 59.3   Vital Signs: Temp: 98.8 F (37.1 C) (06/01 0533) Temp Source: Axillary (05/31 2105) BP: 159/48 mmHg (06/01 0533) Pulse Rate: 79 (06/01 0533)  Labs:  Recent Labs  10/22/15 0515 10/23/15 0450 10/24/15 0550  HGB 9.3* 9.3* 8.3*  HCT 33.5* 33.5* 29.7*  PLT 423* 443* 439*  LABPROT 35.3* 36.0* 39.2*  INR 3.63* 3.73* 4.17*  CREATININE 3.15* 3.96* 3.36*    Estimated Creatinine Clearance: 15.3 mL/min (by C-G formula based on Cr of 3.36).   Medications:  Scheduled:  . allopurinol  100 mg Oral Daily  . antiseptic oral rinse  7 mL Mouth Rinse q12n4p  . aspirin EC  81 mg Oral Daily  . brimonidine  1 drop Both Eyes BID  . buPROPion  150 mg Oral Daily  . chlorhexidine  15 mL Mouth Rinse BID  . cholecalciferol  1,000 Units Oral BID  . fluconazole  100 mg Oral Daily  . hydrALAZINE  25 mg Oral Q6H  . isosorbide mononitrate  60 mg Oral Daily  . levothyroxine  137 mcg Oral QAC breakfast  . metoprolol tartrate  50 mg Oral BID  . mometasone-formoterol  2 puff Inhalation BID  . pantoprazole  40 mg Oral BID  . polyethylene glycol  17 g Oral Daily  . QUEtiapine  12.5 mg Oral QHS  . timolol  1 drop Both Eyes BID  . Warfarin - Pharmacist Dosing Inpatient   Does not apply q1800    Assessment: 80yo female on Coumadin 3mg  daily PTA for hx of DVT but last anticoag visit on 5/12 showed an INR of 6.7 so was  holding dose. Resumed 5/24 at a lower dose. Lower GIB on admit with INR 5.13.  INR continues to trend up to 4.17. Dose held past three days but INR continues to rise but no bleeding per RN. Hgb stable at 9.3, plts 443. Fluconazole may elevate the INR  Goal of Therapy:  INR 2-3 Monitor platelets by anticoagulation protocol: Yes  Plan:  No Coumadin today Monitor daily INR, CBC, s/s of bleed  Albertina Parr, PharmD., BCPS Clinical Pharmacist Pager [phone]

## 2015-10-24 NOTE — Procedures (Signed)
History: Wendy Cole is an 80 y.o. female patient with altered mental status. Routine inpatient EEG was performed for further evaluation.   Patient Active Problem List   Diagnosis Date Noted  . Altered mental status   . Absolute anemia   . Pulmonary edema   . Abdominal pain   . Altered mental state   . Edema of both legs   . Psychotic episode   . Urinary tract infection, site not specified   . Controlled diabetes mellitus type 2 with complications ([place])   . Chronic kidney disease, stage III (moderate)   . Supratherapeutic INR 10/05/2015  . Lower GI bleed 10/05/2015  . Diabetes mellitus type 2, uncontrolled, with complications ([place]) [id number]  . Anxiety 10/05/2015  . Depression 10/05/2015  . CAD in native artery 10/05/2015  . Paroxysmal atrial fibrillation ([place]) 10/05/2015  . Chronic diastolic CHF (congestive heart failure) ([place]) 10/05/2015  . Cardiomyopathy ([place]) 10/05/2015  . HLD (hyperlipidemia) 10/05/2015  . CHF, acute on chronic ([place]) 10/05/2015  . Bleeding gastrointestinal   . Systolic CHF, acute on chronic ([place])   . Syncope and collapse   . Syncope 07/23/2015  . Family history of aneurysm 07/05/2015  . Thoracic back pain 01/21/2015  . Edema 09/21/2014  . Cellulitis of foot, left 08/14/2014  . Acute sinusitis 06/22/2014  . Murmur 02/20/2014  . Hyperglycemia 08/11/2013  . Encounter for therapeutic drug monitoring 06/21/2013  . Leg pain 05/29/2013  . Swelling of limb-Left foot 05/29/2013  . Occlusion and stenosis of carotid artery without mention of cerebral infarction 05/20/2012  . Chronic venous embolism and thrombosis of deep vessels of lower extremity ([place]) 05/20/2012  . LBP (low back pain) 04/13/2012  . Flash pulmonary edema ([place]) 02/03/2012  . SIRS (systemic inflammatory response syndrome) ([place]) 02/03/2012  . Acute respiratory failure with hypoxia ([place]) 02/03/2012  . Pyelonephritis 02/02/2012  . Fever 02/02/2012  . Iritis   . Chronic anticoagulation    . Obesity   . Cancer ([place])   . HOH (hard of hearing)   . GERD (gastroesophageal reflux disease)   . Hypothyroidism   . Aftercare following surgery of the circulatory system, [place] 01/29/2012  . Hypertension associated with diabetes ([place]) 12/15/2011  . Carotid stenosis, bilateral 11/13/2011  . Chronic systolic CHF (congestive heart failure), NYHA class 3- EF 25-30% 09/15/2011  . Nonischemic cardiomyopathy ([place]) 08/07/2011  . SOB (shortness of breath) 07/13/2011  . PAF (paroxysmal atrial fibrillation) ([place]) 07/13/2011  . Leukocytosis 07/13/2011  . OSA on CPAP   . Anemia   . CKD (chronic kidney disease) stage 3, GFR 30-59 ml/min   . Gout   . Stroke ([place])   . Headache 11/27/2009  . History of DVT (deep vein thrombosis) 01/31/2009  . LIVER FUNCTION TESTS, ABNORMAL, HX OF 12/11/2008  . Essential hypertension 01/17/2008  . APHTHOUS STOMATITIS 01/17/2008  . Lower extremity edema 10/11/2007  . Dyspnea on exertion 08/25/2007  . Chronic kidney disease (CKD), stage IV (severe) ([place]) 07/05/2007  . BUNION 04/28/2007  . Diabetes type 2, controlled ([place]) 03/22/2007  . Dyslipidemia 03/22/2007  . Unspecified Anemia 03/22/2007  . OSTEOARTHRITIS 03/22/2007  . Polymyalgia rheumatica- off steroids since 2012 03/22/2007     Current facility-administered medications:  .  0.45 % sodium chloride infusion, , Intravenous, Continuous, Maryann Mikhail, DO, Last Rate: 75 mL/hr at 10/24/15 0845 .  acetaminophen (TYLENOL) suppository 650 mg, 650 mg, Rectal, Q4H PRN, Cherene Altes, MD, 650 mg at 10/14/15 0053 .  acetaminophen (TYLENOL) tablet 650 mg,  650 mg, Oral, Q4H PRN, Cherene Altes, MD, 650 mg at 10/22/15 0943 .  albuterol (PROVENTIL) (2.5 MG/3ML) 0.083% nebulizer solution 2.5 mg, 2.5 mg, Nebulization, Q2H PRN, Cherene Altes, MD, 2.5 mg at 10/06/15 1733 .  allopurinol (ZYLOPRIM) tablet 100 mg, 100 mg, Oral, Daily, Allie Bossier, MD, 100 mg at 10/24/15 1033 .  antiseptic oral rinse (CPC /  CETYLPYRIDINIUM CHLORIDE 0.05%) solution 7 mL, 7 mL, Mouth Rinse, q12n4p, Cherene Altes, MD, 7 mL at 10/24/15 1200 .  aspirin EC tablet 81 mg, 81 mg, Oral, Daily, Allie Bossier, MD, 81 mg at 10/24/15 1033 .  bisacodyl (DULCOLAX) suppository 10 mg, 10 mg, Rectal, Daily PRN, Cherene Altes, MD .  brimonidine (ALPHAGAN) 0.2 % ophthalmic solution 1 drop, 1 drop, Both Eyes, BID, Cherene Altes, MD, Stopped at 10/24/15 1033 .  buPROPion [facility] SR) 12 hr tablet 150 mg, 150 mg, Oral, Daily, Allie Bossier, MD, 150 mg at 10/24/15 1033 .  chlorhexidine (PERIDEX) 0.12 % solution 15 mL, 15 mL, Mouth Rinse, BID, Cherene Altes, MD, 15 mL at 10/23/15 2249 .  cholecalciferol (VITAMIN D) tablet 1,000 Units, 1,000 Units, Oral, BID, Allie Bossier, MD, 1,000 Units at 10/24/15 1033 .  fluconazole (DIFLUCAN) tablet 100 mg, 100 mg, Oral, Daily, Cherene Altes, MD, 100 mg at 10/24/15 1033 .  haloperidol lactate (HALDOL) injection 2 mg, 2 mg, Intravenous, Q6H PRN, Robbie Lis, MD, 2 mg at 10/23/15 1220 .  hydrALAZINE (APRESOLINE) tablet 25 mg, 25 mg, Oral, Q6H, Cherene Altes, MD, 25 mg at 10/24/15 1259 .  isosorbide mononitrate (IMDUR) 24 hr tablet 60 mg, 60 mg, Oral, Daily, Allie Bossier, MD, 60 mg at 10/24/15 1033 .  levothyroxine (SYNTHROID, LEVOTHROID) tablet 137 mcg, 137 mcg, Oral, QAC breakfast, Cherene Altes, MD, 137 mcg at 10/24/15 [phone] .  LORazepam (ATIVAN) injection 1 mg, 1 mg, Intravenous, Q4H PRN, Robbie Lis, MD .  magnesium citrate solution 0.5 Bottle, 0.5 Bottle, Oral, Once PRN, Cherene Altes, MD .  metoprolol (LOPRESSOR) tablet 50 mg, 50 mg, Oral, BID, Cherene Altes, MD, 50 mg at 10/24/15 1033 .  mometasone-formoterol (DULERA) 100-5 MCG/ACT inhaler 2 puff, 2 puff, Inhalation, BID, Allie Bossier, MD, 2 puff at 10/23/15 2015 .  ondansetron (ZOFRAN) injection 4 mg, 4 mg, Intravenous, Q6H PRN, Allie Bossier, MD, 4 mg at 10/18/15 0659 .  pantoprazole (PROTONIX) EC  tablet 40 mg, 40 mg, Oral, BID, Cherene Altes, MD, 40 mg at 10/24/15 1033 .  polyethylene glycol (MIRALAX / GLYCOLAX) packet 17 g, 17 g, Oral, Daily, Dianne Dun, NP, 17 g at 10/22/15 0942 .  prednisoLONE acetate (PRED FORTE) 1 % ophthalmic suspension 1 drop, 1 drop, Both Eyes, Daily PRN, Allie Bossier, MD, 1 drop at 10/23/15 1056 .  QUEtiapine (SEROQUEL) tablet 12.5 mg, 12.5 mg, Oral, QHS, Cherene Altes, MD, 12.5 mg at 10/23/15 2250 .  simethicone (MYLICON) 40 [id number] suspension 40 mg, 40 mg, Oral, Q6H PRN, Cherene Altes, MD .  sodium chloride flush (NS) 0.9 % injection 10-40 mL, 10-40 mL, Intracatheter, PRN, Allie Bossier, MD, 10 mL at 10/23/15 1231 .  timolol (TIMOPTIC) 0.5 % ophthalmic solution 1 drop, 1 drop, Both Eyes, BID, Allie Bossier, MD, 1 drop at 10/23/15 2250 .  Warfarin - Pharmacist Dosing Inpatient, , Does not apply, q1800, Jaquita Folds, RPH, Stopped at 10/24/15 1800   Introduction:  This is a  19 channel routine scalp EEG performed at the bedside with bipolar and monopolar montages arranged in accordance to the international 10/20 system of electrode placement. One channel was dedicated to EKG recording.   Findings:  Generalized background slowing in the range of 5-6 Hz. Few triphasic waves noted. No definite evidence of abnormal epileptiform discharges or electrographic seizures were noted during this recording.   Impression:  Abnormal routine inpatient EEG suggestive of moderate encephalopathy. Clinical correlation is recommended .

## 2015-10-24 NOTE — Progress Notes (Signed)
Physical Therapy Treatment Patient Details Name: Wendy Cole MRN: [id number] DOB: 1936/02/16 Today's Date: 10/24/2015    History of Present Illness This 80 y.o. female was brought to ED when she was unable to get up from the toilet.  In ED pt was grossly volume overloaded and had Gu[place] + stool.  Hgb 5.8 and INR 5.8.  Dx: UTI, diastolic HF exacerbation, GI bleed, OSA, paroxysmal A-Fib, toxic metabolic encephalopathy w/ increasing confusion likely UTI contributing along w/ steroid induced delirium.[name]  PMH includes:  Anxiety/depression, A-Fib, chronic severe diastolic CHF, HTN, stage III CKD, h/o DVT, OSA, Breast CA, s/p Rt mastectomy.     PT Comments    Pt performed increased edge of bed activities with longer sitting balance.  Pt fatigue quickly and required constant support for balance.  No transfer performed today due to increased assist needed with mobility to edge of bed and balance at edge of bed.    Follow Up Recommendations  SNF;Supervision/Assistance - 24 hour     Equipment Recommendations  Other (comment) (TBD at next venue of care.  )    Recommendations for Other Services       Precautions / Restrictions Precautions Precautions: Fall Restrictions Weight Bearing Restrictions: No    Mobility  Bed Mobility Overal bed mobility: Needs Assistance Bed Mobility: Supine to Sit Rolling: Total assist   Supine to sit: Total assist;+2 for physical assistance Sit to supine: Total assist;+2 for physical assistance   General bed mobility comments: pt performed 10% of rolling and sitting up.  PTA and OT performed flat spin with use of chux, pt able to reach for therapist to assist in sitting.    Transfers Overall transfer level:  (did not perform, pt required heavy assist and fatigue after performing sitting balance and ADLs edge of bed.  )               General transfer comment: not attempted today  Ambulation/Gait                 Stairs             Wheelchair Mobility    Modified Rankin (Stroke Patients Only)       Balance Overall balance assessment: Needs assistance   Sitting balance-Leahy Scale: Poor Sitting balance - Comments: up to mod to max assist, primarily mod assist with blocking of B LEs.  Pt performed with cues for anterior pelvic tilt, forward weight shifting, minimal reaching and assist to correct head control.                             Cognition Arousal/Alertness: Lethargic (eyes open most of session) Behavior During Therapy:  (yelled at times ) Overall Cognitive Status: Impaired/Different from baseline                 General Comments: followed some commands but not consistently, delayed responses    Exercises      General Comments        Pertinent Vitals/Pain Pain Assessment: Faces Faces Pain Scale: Hurts even more Pain Location: "I don't know" "What?" Pain Descriptors / Indicators: Grimacing Pain Intervention(s): Monitored during session;Repositioned    Home Living                      Prior Function            PT Goals (current goals can now be found in the care  plan section) Acute Rehab PT Goals Patient Stated Goal: none stated; confused Potential to Achieve Goals: Fair Progress towards PT goals: Progressing toward goals    Frequency  Min 2X/week    PT Plan Current plan remains appropriate    Co-evaluation PT/OT/SLP Co-Evaluation/Treatment: Yes Reason for Co-Treatment: Complexity of the patient's impairments (multi-system involvement);For patient/therapist safety PT goals addressed during session: Mobility/safety with mobility;Balance OT goals addressed during session: ADL's and self-care     End of Session Equipment Utilized During Treatment: Gait belt;Oxygen Activity Tolerance: Patient limited by fatigue;Patient limited by lethargy;Patient limited by pain Patient left: in chair;with call bell/phone within reach;with chair alarm set     Time:  [id number] PT Time Calculation (min) (ACUTE ONLY): 24 min  Charges:  $Therapeutic Activity: 8-22 mins                    G Codes:      Cristela Blue 2015-10-26, 3:57 PM Governor Rooks, PTA pager [phone]

## 2015-10-24 NOTE — Progress Notes (Signed)
EEG Completed; Results Pending  

## 2015-10-24 NOTE — Progress Notes (Signed)
PROGRESS NOTE    Wendy Cole  [id number] DOB: 1936/03/23 DOA: 10/05/2015 PCP: Walker Kehr, MD   Brief Narrative:  HPI On 10/05/2015 by Dr. Dia Crawford Wendy Cole is a 80 y.o. WF PMHx Anxiety, Depression,CVA, CAD native artery, Paroxysmal Atrial Fibrillation on Warfarin Rx, Chronic Diastolic CHF/Cardiomyopathy, NSTEMI, HTN, Stage III CKD, Hypothyroid, OSA and hx of Breast Cancer S/P Rt. Mastectomy HLD, DVT,  Who presents to the ED , presents to the Emergency Department today complaining of BLE swelling and shortness of breath. Noted increasing weakness since DC from hospital on 07-30-15 due to syncopal episode. Seen Neurology on 09-05-15 for follow up with no neurologic cause. Pt notes inability to ambulate due to weakness and called EMS as she was unable to get off of the toilet. Started using wheelchair in the past week due to weakness. Pt also notes right shoulder pain s/p mechanical fall earlier this week with no head trauma. Notes pain is 5/10 and aching. Pt has Chest tightness with SOB. No CP. No N/V/D. No headaches. No fevers. No hematochezia. No black tarry stools. No other symptoms noted.  Occult blood positive In ED. Patient has not noticed blood in urine or Stool.states allergic to Lasix will cause her extreme muscle spasms ~ 30-45 minutes post administration..states baseline weight is ~218 pounds(99 kg). States does not weigh herself daily  Assessment & Plan  Obtundation / Toxic metabolic encephalopathy / yeast UTI / Agitation / Psychosis  -Probably related to UTI, urine culture grew yeast and she is treated with fluconazole -B12, folate, and ammonia recently normal  -CT and MRI brain negative for acute intracranial abnormality   -Patient was seen by psychiatry and their thought is that patient may have had steroid induced delirium but currently she is off of steroids and mental status still not improved -Continue Seroquel 12.5 mg at bedtime -Ativan and haldol  PRN for restlessness and / or agitation  -Had long conversation with husband today about mental status.  Likely unrelated to steroids.  Repeat Blood cultures negative.  Possibly due to UTI, which is being treated. Brain imaging shows no acute intracranial abnormality. Husband was frustrated that we do not know what the cause of her AMS is.  He feels it is medication related. -Neurology consulted and appreciated. EEG pending. ?dementia  Staph agalactiae UTI / Coagulase negative bacteremia  -Has completed a course of rocephin - no sx of ongoing infection  -Please note blood cultures with staph species coagulase negative which suggests contamination -Repeat Blood cultures 10/16/15 show no growth  Occult GI bleed / acute blood loss anemia in the setting of supratherapeutic INR -Per prior documentation, patient's husband did not want a GI evaluation at this time -Patient has received 3 units of PRBC transfusion during this hospital stay -No reports of bleeding while on Coumadin  -Off note, she was given vitamin K on admission for supratherapeutic INR which will corrected INR -Small drop in hemoglobin today, 8.3, however, likely dilutional due to IVF given for AKI  Acute exacerbation of chronic grade 2 diastolic CHF -Baseline weight 93 kg 07/27/2015  -Weight in past 48 hours: 84.8 kg --> 83.9 kg . Today 86.6kg -Diuretics held due to worsening renal function -Monitor intake/output, daily weights -Patient does have incontinence.   Acute on Chronic kidney disease stage IV -Creatinine at 2.56 on 10/19/2015  -Cr peaked 3.96, torsemide was discontinued on 5/30, will discontinue metolazone today -Given gentle IVF  -Creatinine today 3.36 -Continue to monitor BMP -Will consult nephrology,  if no improvement  Paroxysmal Atrial fibrillation -CHADS vasc score 4 -Per patient's husband, wanted to resume Coumadin so this was done and so far tolerates well  -INR 4.17, dosing per pharmacy (spoke with  pharmacy, no coumadin given over he last 4 days, ?effect of fluconazole) -Rate controlled with metoprolol   Essential hypertension -Continue metoprolol, imdur, hydralazine   OSA -CPAP QHS   Hypothyroidism -Continue Synthroid  -TSH normal 10/05/15  History of DVT -Most recent duplex in 2014 was negative for DVT -LE doppler 5/16 - no DVT  Obesity  -Body mass index is 30.68 kg/(m^2).  DVT Prophylaxis  Coumadin  Code Status: DNR  Family Communication:  None at bedside.  Disposition Plan: Admitted. Pending SNF once Cr improves. Pending EEG  Consultants Cardiology Gastroenterology  Procedures  LE doppler 10/08/15, neg for DVT PICC placed on 10/05/15  Antibiotics   Anti-infectives    Start     Dose/Rate Route Frequency Ordered Stop   10/19/15 1000  fluconazole (DIFLUCAN) tablet 100 mg     100 mg Oral Daily 10/18/15 1632     10/17/15 1100  fluconazole (DIFLUCAN) IVPB 100 mg  Status:  Discontinued     100 mg 50 mL/hr over 60 Minutes Intravenous Every 24 hours 10/17/15 0841 10/18/15 1632   10/15/15 1100  fluconazole (DIFLUCAN) IVPB 200 mg  Status:  Discontinued     200 mg 100 mL/hr over 60 Minutes Intravenous Every 24 hours 10/15/15 1034 10/17/15 0841   10/06/15 0915  cefTRIAXone (ROCEPHIN) 1 g in dextrose 5 % 50 mL IVPB     1 g 100 mL/hr over 30 Minutes Intravenous Every 24 hours 10/06/15 0913 10/11/15 1020      Subjective:   Wendy Cole seen and examined today.  No complaints today. Denies chest pain, or shortness of breath. Not very interactive.   Objective:   Filed Vitals:   10/23/15 1409 10/23/15 2015 10/23/15 2105 10/24/15 0533  BP: 140/46  169/48 159/48  Pulse: 72 71 76 79  Temp: 99.3 F (37.4 C)  99.6 F (37.6 C) 98.8 F (37.1 C)  TempSrc: Oral  Axillary   Resp:  22 21 20   Height:      Weight:    84.369 kg (186 lb)  SpO2:  98% 94% 95%    Intake/Output Summary (Last 24 hours) at 10/24/15 1149 Last data filed at 10/23/15 1852  Gross per 24  hour  Intake 1561.5 ml  Output      0 ml  Net 1561.5 ml   Filed Weights   10/22/15 0541 10/23/15 0428 10/24/15 0533  Weight: 83.9 kg (184 lb 15.5 oz) 86.637 kg (191 lb) 84.369 kg (186 lb)    Exam  General: Well developed, well nourished, NAD  HEENT: NCAT, mucous membranes moist.   Cardiovascular: S1 S2 auscultated, 2/6 SEM, RRR  Respiratory: Clear to auscultation bilaterally   Abdomen: Soft, nontender, nondistended, + bowel sounds  Extremities: warm dry without cyanosis clubbing or edema  Data Reviewed: I have personally reviewed following labs and imaging studies  CBC:  Recent Labs Lab 10/19/15 0427 10/20/15 0636 10/22/15 0515 10/23/15 0450 10/24/15 0550  WBC 6.8 7.1 7.7 8.2 7.3  HGB 8.6* 9.2* 9.3* 9.3* 8.3*  HCT 31.8* 33.4* 33.5* 33.5* 29.7*  MCV 80.1 81.1 79.6 79.8 78.8  PLT 364 382 423* 443* [id number]*   Basic Metabolic Panel:  Recent Labs Lab 10/19/15 0427 10/22/15 0515 10/23/15 0450 10/24/15 0550  NA 140 139 137 132*  K 3.4* 3.7  4.2 4.1  CL 100* 98* 97* 97*  CO2 30 25 27 24   GLUCOSE 104* 91 111* 85  BUN 54* 56* 60* 61*  CREATININE 2.56* 3.15* 3.96* 3.36*  CALCIUM 8.8* 8.8* 8.4* 7.5*   GFR: Estimated Creatinine Clearance: 15.3 mL/min (by C-G formula based on Cr of 3.36). Liver Function Tests:  Recent Labs Lab 10/19/15 0427 10/23/15 0450  AST 33 39  ALT 22 21  ALKPHOS 116 154*  BILITOT 1.6* 1.4*  PROT 5.6* 5.7*  ALBUMIN 2.5* 2.4*   No results for input(s): LIPASE, AMYLASE in the last 168 hours. No results for input(s): AMMONIA in the last 168 hours. Coagulation Profile:  Recent Labs Lab 10/20/15 0636 10/21/15 0604 10/22/15 0515 10/23/15 0450 10/24/15 0550  INR 2.98* 3.42* 3.63* 3.73* 4.17*   Cardiac Enzymes: No results for input(s): CKTOTAL, CKMB, CKMBINDEX, TROPONINI in the last 168 hours. BNP (last 3 results)  Recent Labs  07/22/15 1503  PROBNP >5000.0*   HbA1C: No results for input(s): HGBA1C in the last 72  hours. CBG: No results for input(s): GLUCAP in the last 168 hours. Lipid Profile: No results for input(s): CHOL, HDL, LDLCALC, TRIG, CHOLHDL, LDLDIRECT in the last 72 hours. Thyroid Function Tests: No results for input(s): TSH, T4TOTAL, FREET4, T3FREE, THYROIDAB in the last 72 hours. Anemia Panel: No results for input(s): VITAMINB12, FOLATE, FERRITIN, TIBC, IRON, RETICCTPCT in the last 72 hours. Urine analysis:    Component Value Date/Time   COLORURINE YELLOW 10/05/2015 1233   APPEARANCEUR TURBID* 10/05/2015 1233   LABSPEC 1.019 10/05/2015 1233   LABSPEC 1.010 06/21/2013 1213   PHURINE 5.0 10/05/2015 1233   PHURINE 6.0 06/21/2013 1213   GLUCOSEU NEGATIVE 10/05/2015 1233   GLUCOSEU NEGATIVE 03/20/2014 1454   GLUCOSEU Negative 06/21/2013 1213   HGBUR LARGE* 10/05/2015 1233   HGBUR Negative 06/21/2013 1213   BILIRUBINUR NEGATIVE 10/05/2015 1233   BILIRUBINUR Negative 06/21/2013 1213   KETONESUR NEGATIVE 10/05/2015 1233   KETONESUR Negative 06/21/2013 1213   PROTEINUR 100* 10/05/2015 1233   PROTEINUR 100 06/21/2013 1213   UROBILINOGEN 0.2 03/20/2014 1454   UROBILINOGEN 0.2 06/21/2013 1213   NITRITE NEGATIVE 10/05/2015 1233   NITRITE Negative 06/21/2013 1213   LEUKOCYTESUR LARGE* 10/05/2015 1233   LEUKOCYTESUR Negative 06/21/2013 1213   Sepsis Labs: @LABRCNTIP (procalcitonin:4,lacticidven:4)  ) Recent Results (from the past 240 hour(s))  Culture, blood (routine x 2)     Status: None   Collection Time: 10/16/15  3:52 PM  Result Value Ref Range Status   Specimen Description BLOOD RIGHT HAND  Final   Special Requests IN PEDIATRIC BOTTLE 3CC  Final   Culture NO GROWTH 5 DAYS  Final   Report Status 10/21/2015 FINAL  Final  Culture, blood (routine x 2)     Status: None   Collection Time: 10/16/15  3:57 PM  Result Value Ref Range Status   Specimen Description BLOOD RIGHT HAND  Final   Special Requests IN PEDIATRIC BOTTLE 3CC  Final   Culture NO GROWTH 5 DAYS  Final   Report  Status 10/21/2015 FINAL  Final      Radiology Studies: No results found.   Scheduled Meds: . allopurinol  100 mg Oral Daily  . antiseptic oral rinse  7 mL Mouth Rinse q12n4p  . aspirin EC  81 mg Oral Daily  . brimonidine  1 drop Both Eyes BID  . buPROPion  150 mg Oral Daily  . chlorhexidine  15 mL Mouth Rinse BID  . cholecalciferol  1,000 Units Oral  BID  . fluconazole  100 mg Oral Daily  . hydrALAZINE  25 mg Oral Q6H  . isosorbide mononitrate  60 mg Oral Daily  . levothyroxine  137 mcg Oral QAC breakfast  . metoprolol tartrate  50 mg Oral BID  . mometasone-formoterol  2 puff Inhalation BID  . pantoprazole  40 mg Oral BID  . polyethylene glycol  17 g Oral Daily  . QUEtiapine  12.5 mg Oral QHS  . timolol  1 drop Both Eyes BID  . Warfarin - Pharmacist Dosing Inpatient   Does not apply q1800   Continuous Infusions: . sodium chloride 75 mL/hr at 10/24/15 0845     LOS: 19 days   Time Spent in minutes   30 minutes  Janean Eischen D.O. on 10/24/2015 at 11:49 AM  Between 7am to 7pm - Pager - [phone]  After 7pm go to [email or web address] - password [email or web address]  And look for the night coverage person covering for me after hours  [facility] Office  [phone]

## 2015-10-25 DIAGNOSIS — E118 Type 2 diabetes mellitus with unspecified complications: Secondary | ICD-10-CM

## 2015-10-25 LAB — COMPREHENSIVE METABOLIC PANEL
ALT: 20 U/L (ref 14–54)
ANION GAP: 14 (ref 5–15)
AST: 31 U/L (ref 15–41)
Albumin: 2.2 g/dL — ABNORMAL LOW (ref 3.5–5.0)
Alkaline Phosphatase: 148 U/L — ABNORMAL HIGH (ref 38–126)
BUN: 59 mg/dL — ABNORMAL HIGH (ref 6–20)
CALCIUM: 8.1 mg/dL — AB (ref 8.9–10.3)
CHLORIDE: 98 mmol/L — AB (ref 101–111)
CO2: 24 mmol/L (ref 22–32)
Creatinine, Ser: 2.92 mg/dL — ABNORMAL HIGH (ref 0.44–1.00)
GFR calc non Af Amer: 14 mL/min — ABNORMAL LOW (ref >60)
GFR, EST AFRICAN AMERICAN: 17 mL/min — AB (ref >60)
Glucose, Bld: 96 mg/dL (ref 65–99)
Potassium: 4.2 mmol/L (ref 3.5–5.1)
SODIUM: 136 mmol/L (ref 135–145)
Total Bilirubin: 1.2 mg/dL (ref 0.3–1.2)
Total Protein: 5.5 g/dL — ABNORMAL LOW (ref 6.5–8.1)

## 2015-10-25 LAB — CBC
HCT: 31.8 % — ABNORMAL LOW (ref 36.0–46.0)
Hemoglobin: 8.9 g/dL — ABNORMAL LOW (ref 12.0–15.0)
MCH: 22.5 pg — AB (ref 26.0–34.0)
MCHC: 28 g/dL — AB (ref 30.0–36.0)
MCV: 80.5 fL (ref 78.0–100.0)
PLATELETS: 418 10*3/uL — AB (ref 150–400)
RBC: 3.95 MIL/uL (ref 3.87–5.11)
RDW: 24.8 % — AB (ref 11.5–15.5)
WBC: 8.6 10*3/uL (ref 4.0–10.5)

## 2015-10-25 LAB — PROTIME-INR
INR: 4.11 — ABNORMAL HIGH (ref 0.00–1.49)
PROTHROMBIN TIME: 38.8 s — AB (ref 11.6–15.2)

## 2015-10-25 NOTE — Progress Notes (Signed)
PROGRESS NOTE    Wendy Cole  [id number] DOB: Oct 08, 1935 DOA: 10/05/2015 PCP: Walker Kehr, MD   Brief Narrative:  HPI On 10/05/2015 by Dr. Dia Crawford Wendy Cole is a 80 y.o. WF PMHx Anxiety, Depression,CVA, CAD native artery, Paroxysmal Atrial Fibrillation on Warfarin Rx, Chronic Diastolic CHF/Cardiomyopathy, NSTEMI, HTN, Stage III CKD, Hypothyroid, OSA and hx of Breast Cancer S/P Rt. Mastectomy HLD, DVT,  Who presents to the ED , presents to the Emergency Department today complaining of BLE swelling and shortness of breath. Noted increasing weakness since DC from hospital on 07-30-15 due to syncopal episode. Seen Neurology on 09-05-15 for follow up with no neurologic cause. Pt notes inability to ambulate due to weakness and called EMS as she was unable to get off of the toilet. Started using wheelchair in the past week due to weakness. Pt also notes right shoulder pain s/p mechanical fall earlier this week with no head trauma. Notes pain is 5/10 and aching. Pt has Chest tightness with SOB. No CP. No N/V/D. No headaches. No fevers. No hematochezia. No black tarry stools. No other symptoms noted.  Occult blood positive In ED. Patient has not noticed blood in urine or Stool.states allergic to Lasix will cause her extreme muscle spasms ~ 30-45 minutes post administration..states baseline weight is ~218 pounds(99 kg). States does not weigh herself daily  Assessment & Plan  Obtundation / Toxic metabolic encephalopathy / yeast UTI / Agitation / Psychosis  -Probably related to UTI, urine culture grew yeast and she is treated with fluconazole -B12, folate, and ammonia recently normal  -CT and MRI brain negative for acute intracranial abnormality   -Patient was seen by psychiatry and their thought is that patient may have had steroid induced delirium but currently she is off of steroids and mental status still not improved -Continue Seroquel 12.5 mg at bedtime -Ativan and haldol  PRN for restlessness and / or agitation  -Had long conversation with husband today about mental status.  Likely unrelated to steroids.  Repeat Blood cultures negative.  Possibly due to UTI, which is being treated. Brain imaging shows no acute intracranial abnormality. Husband was frustrated that we do not know what the cause of her AMS is.  He feels it is medication related. -Neurology consulted and appreciated. ?dementia workup as an outpatient -EEG was abnormal, moderate encephalopathy  Staph agalactiae UTI / Coagulase negative bacteremia  -Has completed a course of rocephin - no sx of ongoing infection  -Please note blood cultures with staph species coagulase negative which suggests contamination -Repeat Blood cultures 10/16/15 show no growth  Occult GI bleed / acute blood loss anemia in the setting of supratherapeutic INR -Per prior documentation, patient's husband did not want a GI evaluation at this time -Patient has received 3 units of PRBC transfusion during this hospital stay -No reports of bleeding while on Coumadin  -Off note, she was given vitamin K on admission for supratherapeutic INR which will corrected INR -Hemoglobin 8.9 today (drop likely diluational)  Acute exacerbation of chronic grade 2 diastolic CHF -Baseline weight 93 kg 07/27/2015  -Weight in past 48 hours: 84.8 kg --> 83.9 kg . Today 80.74kg -Diuretics held due to worsening renal function -Monitor intake/output, daily weights -Patient does have incontinence.   Acute on Chronic kidney disease stage IV -Creatinine at 2.56 on 10/19/2015  -Cr peaked 3.96, torsemide was discontinued on 5/30, will discontinue metolazone today -Given gentle IVF  -Creatinine today 2.92 -Continue to monitor BMP  Paroxysmal Atrial fibrillation -CHADS  vasc score 4 -Per patient's husband, wanted to resume Coumadin so this was done and so far tolerates well  -INR 4.11, dosing per pharmacy (spoke with pharmacy, no coumadin given  over he last 5 days, ?effect of fluconazole) -Rate controlled with metoprolol   Essential hypertension -Continue metoprolol, imdur, hydralazine   OSA -CPAP QHS   Hypothyroidism -Continue Synthroid  -TSH normal 10/05/15  History of DVT -Most recent duplex in 2014 was negative for DVT -LE doppler 5/16 - no DVT  Obesity  -Body mass index is 30.68 kg/(m^2).  DVT Prophylaxis  Coumadin  Code Status: DNR  Family Communication:  None at bedside.  Disposition Plan: Admitted. Likely discharge to SNF this weekend  Consultants Cardiology Gastroenterology Neurology  Procedures  LE doppler 10/08/15, neg for DVT PICC placed on 10/05/15 EEG  Antibiotics   Anti-infectives    Start     Dose/Rate Route Frequency Ordered Stop   10/19/15 1000  fluconazole (DIFLUCAN) tablet 100 mg     100 mg Oral Daily 10/18/15 1632     10/17/15 1100  fluconazole (DIFLUCAN) IVPB 100 mg  Status:  Discontinued     100 mg 50 mL/hr over 60 Minutes Intravenous Every 24 hours 10/17/15 0841 10/18/15 1632   10/15/15 1100  fluconazole (DIFLUCAN) IVPB 200 mg  Status:  Discontinued     200 mg 100 mL/hr over 60 Minutes Intravenous Every 24 hours 10/15/15 1034 10/17/15 0841   10/06/15 0915  cefTRIAXone (ROCEPHIN) 1 g in dextrose 5 % 50 mL IVPB     1 g 100 mL/hr over 30 Minutes Intravenous Every 24 hours 10/06/15 0913 10/11/15 1020      Subjective:   Wendy Cole seen and examined today.   Not very interactive.   Objective:   Filed Vitals:   10/24/15 1443 10/24/15 2021 10/24/15 2133 10/25/15 0459  BP: 144/47  164/50 162/49  Pulse: 73 69 79 76  Temp: 98.6 F (37 C)  97.5 F (36.4 C) 98.2 F (36.8 C)  TempSrc: Axillary  Axillary Axillary  Resp:  22 19 18   Height:      Weight:   80.74 kg (178 lb)   SpO2: 94% 94% 94% 95%    Intake/Output Summary (Last 24 hours) at 10/25/15 1251 Last data filed at 10/25/15 1001  Gross per 24 hour  Intake 3024.5 ml  Output      0 ml  Net 3024.5 ml    Filed Weights   10/23/15 0428 10/24/15 0533 10/24/15 2133  Weight: 86.637 kg (191 lb) 84.369 kg (186 lb) 80.74 kg (178 lb)    Exam  General: Well developed, well nourished, NAD  HEENT: NCAT, mucous membranes moist.   Cardiovascular: S1 S2 auscultated, 2/6 SEM, RRR  Respiratory: Clear to auscultation   Abdomen: Soft, nontender, nondistended, + bowel sounds  Extremities: warm dry without cyanosis clubbing or edema  Data Reviewed: I have personally reviewed following labs and imaging studies  CBC:  Recent Labs Lab 10/20/15 0636 10/22/15 0515 10/23/15 0450 10/24/15 0550 10/25/15 0355  WBC 7.1 7.7 8.2 7.3 8.6  HGB 9.2* 9.3* 9.3* 8.3* 8.9*  HCT 33.4* 33.5* 33.5* 29.7* 31.8*  MCV 81.1 79.6 79.8 78.8 80.5  PLT 382 423* 443* 439* [id number]*   Basic Metabolic Panel:  Recent Labs Lab 10/19/15 0427 10/22/15 0515 10/23/15 0450 10/24/15 0550 10/25/15 0355  NA 140 139 137 132* 136  K 3.4* 3.7 4.2 4.1 4.2  CL 100* 98* 97* 97* 98*  CO2 30 25 27  24  24  GLUCOSE 104* 91 111* 85 96  BUN 54* 56* 60* 61* 59*  CREATININE 2.56* 3.15* 3.96* 3.36* 2.92*  CALCIUM 8.8* 8.8* 8.4* 7.5* 8.1*   GFR: Estimated Creatinine Clearance: 17.3 mL/min (by C-G formula based on Cr of 2.92). Liver Function Tests:  Recent Labs Lab 10/19/15 0427 10/23/15 0450 10/25/15 0355  AST 33 39 31  ALT 22 21 20   ALKPHOS 116 154* 148*  BILITOT 1.6* 1.4* 1.2  PROT 5.6* 5.7* 5.5*  ALBUMIN 2.5* 2.4* 2.2*   No results for input(s): LIPASE, AMYLASE in the last 168 hours. No results for input(s): AMMONIA in the last 168 hours. Coagulation Profile:  Recent Labs Lab 10/21/15 0604 10/22/15 0515 10/23/15 0450 10/24/15 0550 10/25/15 0355  INR 3.42* 3.63* 3.73* 4.17* 4.11*   Cardiac Enzymes: No results for input(s): CKTOTAL, CKMB, CKMBINDEX, TROPONINI in the last 168 hours. BNP (last 3 results)  Recent Labs  07/22/15 1503  PROBNP >5000.0*   HbA1C: No results for input(s): HGBA1C in the last 72  hours. CBG: No results for input(s): GLUCAP in the last 168 hours. Lipid Profile: No results for input(s): CHOL, HDL, LDLCALC, TRIG, CHOLHDL, LDLDIRECT in the last 72 hours. Thyroid Function Tests: No results for input(s): TSH, T4TOTAL, FREET4, T3FREE, THYROIDAB in the last 72 hours. Anemia Panel: No results for input(s): VITAMINB12, FOLATE, FERRITIN, TIBC, IRON, RETICCTPCT in the last 72 hours. Urine analysis:    Component Value Date/Time   COLORURINE YELLOW 10/05/2015 1233   APPEARANCEUR TURBID* 10/05/2015 1233   LABSPEC 1.019 10/05/2015 1233   LABSPEC 1.010 06/21/2013 1213   PHURINE 5.0 10/05/2015 1233   PHURINE 6.0 06/21/2013 1213   GLUCOSEU NEGATIVE 10/05/2015 1233   GLUCOSEU NEGATIVE 03/20/2014 1454   GLUCOSEU Negative 06/21/2013 1213   HGBUR LARGE* 10/05/2015 1233   HGBUR Negative 06/21/2013 1213   BILIRUBINUR NEGATIVE 10/05/2015 1233   BILIRUBINUR Negative 06/21/2013 1213   KETONESUR NEGATIVE 10/05/2015 1233   KETONESUR Negative 06/21/2013 1213   PROTEINUR 100* 10/05/2015 1233   PROTEINUR 100 06/21/2013 1213   UROBILINOGEN 0.2 03/20/2014 1454   UROBILINOGEN 0.2 06/21/2013 1213   NITRITE NEGATIVE 10/05/2015 1233   NITRITE Negative 06/21/2013 1213   LEUKOCYTESUR LARGE* 10/05/2015 1233   LEUKOCYTESUR Negative 06/21/2013 1213   Sepsis Labs: @LABRCNTIP (procalcitonin:4,lacticidven:4)  ) Recent Results (from the past 240 hour(s))  Culture, blood (routine x 2)     Status: None   Collection Time: 10/16/15  3:52 PM  Result Value Ref Range Status   Specimen Description BLOOD RIGHT HAND  Final   Special Requests IN PEDIATRIC BOTTLE 3CC  Final   Culture NO GROWTH 5 DAYS  Final   Report Status 10/21/2015 FINAL  Final  Culture, blood (routine x 2)     Status: None   Collection Time: 10/16/15  3:57 PM  Result Value Ref Range Status   Specimen Description BLOOD RIGHT HAND  Final   Special Requests IN PEDIATRIC BOTTLE 3CC  Final   Culture NO GROWTH 5 DAYS  Final   Report  Status 10/21/2015 FINAL  Final      Radiology Studies: No results found.   Scheduled Meds: . allopurinol  100 mg Oral Daily  . antiseptic oral rinse  7 mL Mouth Rinse q12n4p  . aspirin EC  81 mg Oral Daily  . brimonidine  1 drop Both Eyes BID  . buPROPion  150 mg Oral Daily  . chlorhexidine  15 mL Mouth Rinse BID  . cholecalciferol  1,000 Units Oral BID  .  fluconazole  100 mg Oral Daily  . hydrALAZINE  25 mg Oral Q6H  . isosorbide mononitrate  60 mg Oral Daily  . levothyroxine  137 mcg Oral QAC breakfast  . metoprolol tartrate  50 mg Oral BID  . mometasone-formoterol  2 puff Inhalation BID  . pantoprazole  40 mg Oral BID  . polyethylene glycol  17 g Oral Daily  . QUEtiapine  12.5 mg Oral QHS  . timolol  1 drop Both Eyes BID  . Warfarin - Pharmacist Dosing Inpatient   Does not apply q1800   Continuous Infusions:     LOS: 20 days   Time Spent in minutes   30 minutes  Wendy Cole D.O. on 10/25/2015 at 12:51 PM  Between 7am to 7pm - Pager - [phone]  After 7pm go to [email or web address] - password [email or web address]  And look for the night coverage person covering for me after hours  [facility] Office  [phone]

## 2015-10-25 NOTE — Care Management Important Message (Signed)
Important Message  Patient Details  Name: Wendy Cole MRN: [id number] Date of Birth: February 16, 1936   Medicare Important Message Given:  Yes    Loann Quill 10/25/2015, 11:08 AM

## 2015-10-25 NOTE — Progress Notes (Signed)
ANTICOAGULATION CONSULT NOTE - Follow Up Consult  Pharmacy Consult for Coumadin Indication: atrial fibrillation  Allergies  Allergen Reactions  . Amlodipine     edema  . Atorvastatin Other (See Comments)    Muscle aches  . Colesevelam Other (See Comments)    unknown  . Coumadin [Warfarin Sodium]     "causes cramps" reaction to brand name only per pt  . Lasix [Furosemide] Other (See Comments)    Muscle cramps  . Statins Other (See Comments)    Muscle aches  . Tape Rash    Patient Measurements: Height: 5\' 6"  (167.6 cm) Weight: 178 lb (80.74 kg) IBW/kg (Calculated) : 59.3   Vital Signs: Temp: 98.2 F (36.8 C) (06/02 0459) Temp Source: Axillary (06/02 0459) BP: 162/49 mmHg (06/02 0459) Pulse Rate: 76 (06/02 0459)  Labs:  Recent Labs  10/23/15 0450 10/24/15 0550 10/25/15 0355  HGB 9.3* 8.3* 8.9*  HCT 33.5* 29.7* 31.8*  PLT 443* 439* 418*  LABPROT 36.0* 39.2* 38.8*  INR 3.73* 4.17* 4.11*  CREATININE 3.96* 3.36* 2.92*    Estimated Creatinine Clearance: 17.3 mL/min (by C-G formula based on Cr of 2.92).   Medications:  Scheduled:  . allopurinol  100 mg Oral Daily  . antiseptic oral rinse  7 mL Mouth Rinse q12n4p  . aspirin EC  81 mg Oral Daily  . brimonidine  1 drop Both Eyes BID  . buPROPion  150 mg Oral Daily  . chlorhexidine  15 mL Mouth Rinse BID  . cholecalciferol  1,000 Units Oral BID  . fluconazole  100 mg Oral Daily  . hydrALAZINE  25 mg Oral Q6H  . isosorbide mononitrate  60 mg Oral Daily  . levothyroxine  137 mcg Oral QAC breakfast  . metoprolol tartrate  50 mg Oral BID  . mometasone-formoterol  2 puff Inhalation BID  . pantoprazole  40 mg Oral BID  . polyethylene glycol  17 g Oral Daily  . QUEtiapine  12.5 mg Oral QHS  . timolol  1 drop Both Eyes BID  . Warfarin - Pharmacist Dosing Inpatient   Does not apply q1800    Assessment: 80yo female on Coumadin 3mg  daily PTA for hx of DVT but last anticoag visit on 5/12 showed an INR of 6.7 so was  holding dose. Resumed 5/24 at a lower dose. Lower GIB on admit with INR 5.13.  INR continues to trend up but seems to have stabilized at 4.11. Dose has been held since 5/29. Would expect INR to start trending back down tomorrow. Hgb stable at 8.9, plts 418. Fluconazole may be elevating the INR.  Goal of Therapy:  INR 2-3 Monitor platelets by anticoagulation protocol: Yes  Plan:  No Coumadin today Monitor daily INR, CBC, s/s of bleed  Elenor Quinones, PharmD, [facility] Clinical Pharmacist Pager [phone] 10/25/2015 9:26 AM

## 2015-10-25 NOTE — Clinical Social Work Note (Signed)
Patient plans to go to [facility] once she is medically ready for discharge and orders have been received, CSW updated [facility].  [facility] said they can take patient over the weekend if she is ready.  CSW to continue to follow patient's progress throughout discharge planning.  Jones Broom. [place], MSW, [place] 10/25/2015 12:22 PM

## 2015-10-26 DIAGNOSIS — I429 Cardiomyopathy, unspecified: Secondary | ICD-10-CM

## 2015-10-26 LAB — BASIC METABOLIC PANEL
Anion gap: 11 (ref 5–15)
BUN: 57 mg/dL — AB (ref 6–20)
CHLORIDE: 99 mmol/L — AB (ref 101–111)
CO2: 26 mmol/L (ref 22–32)
Calcium: 8.5 mg/dL — ABNORMAL LOW (ref 8.9–10.3)
Creatinine, Ser: 2.46 mg/dL — ABNORMAL HIGH (ref 0.44–1.00)
GFR calc Af Amer: 21 mL/min — ABNORMAL LOW (ref >60)
GFR calc non Af Amer: 18 mL/min — ABNORMAL LOW (ref >60)
GLUCOSE: 96 mg/dL (ref 65–99)
POTASSIUM: 4 mmol/L (ref 3.5–5.1)
Sodium: 136 mmol/L (ref 135–145)

## 2015-10-26 LAB — CBC
HEMATOCRIT: 31.3 % — AB (ref 36.0–46.0)
HEMOGLOBIN: 8.8 g/dL — AB (ref 12.0–15.0)
MCH: 22.1 pg — AB (ref 26.0–34.0)
MCHC: 28.1 g/dL — ABNORMAL LOW (ref 30.0–36.0)
MCV: 78.4 fL (ref 78.0–100.0)
Platelets: 475 10*3/uL — ABNORMAL HIGH (ref 150–400)
RBC: 3.99 MIL/uL (ref 3.87–5.11)
RDW: 24.5 % — ABNORMAL HIGH (ref 11.5–15.5)
WBC: 6.5 10*3/uL (ref 4.0–10.5)

## 2015-10-26 LAB — PROTIME-INR
INR: 4.95 — ABNORMAL HIGH (ref 0.00–1.49)
Prothrombin Time: 44.6 seconds — ABNORMAL HIGH (ref 11.6–15.2)

## 2015-10-26 MED ORDER — TRAMADOL HCL 50 MG PO TABS
50.0000 mg | ORAL_TABLET | Freq: Four times a day (QID) | ORAL | Status: DC | PRN
  Administered 2015-10-26 – 2015-10-30 (×10): 50 mg via ORAL
  Filled 2015-10-26 (×10): qty 1

## 2015-10-26 NOTE — Progress Notes (Signed)
PROGRESS NOTE    Wendy Cole  [id number] DOB: [birth year] DOA: 10/05/2015 PCP: Walker Kehr, MD   Brief Narrative:  HPI On 10/05/2015 by Dr. Dia Crawford Wendy Cole is a 80 y.o. WF PMHx Anxiety, Depression,CVA, CAD native artery, Paroxysmal Atrial Fibrillation on Warfarin Rx, Chronic Diastolic CHF/Cardiomyopathy, NSTEMI, HTN, Stage III CKD, Hypothyroid, OSA and hx of Breast Cancer S/P Rt. Mastectomy HLD, DVT,  Who presents to the ED , presents to the Emergency Department today complaining of BLE swelling and shortness of breath. Noted increasing weakness since DC from hospital on 07-30-15 due to syncopal episode. Seen Neurology on 09-05-15 for follow up with no neurologic cause. Pt notes inability to ambulate due to weakness and called EMS as she was unable to get off of the toilet. Started using wheelchair in the past week due to weakness. Pt also notes right shoulder pain s/p mechanical fall earlier this week with no head trauma. Notes pain is 5/10 and aching. Pt has Chest tightness with SOB. No CP. No N/V/D. No headaches. No fevers. No hematochezia. No black tarry stools. No other symptoms noted.  Occult blood positive In ED. Patient has not noticed blood in urine or Stool.states allergic to Lasix will cause her extreme muscle spasms ~ 30-45 minutes post administration..states baseline weight is ~218 pounds(99 kg). States does not weigh herself daily  Assessment & Plan  Obtundation / Toxic metabolic encephalopathy / yeast UTI / Agitation / Psychosis  -Probably related to UTI, urine culture grew yeast and she is treated with fluconazole -B12, folate, and ammonia recently normal  -CT and MRI brain negative for acute intracranial abnormality   -Patient was seen by psychiatry and their thought is that patient may have had steroid induced delirium but currently she is off of steroids and mental status still not improved -Continue Seroquel 12.5 mg at bedtime -Ativan and haldol  PRN for restlessness and / or agitation  -Had long conversation with husband today about mental status.  Likely unrelated to steroids.  Repeat Blood cultures negative.  Possibly due to UTI, which is being treated. Brain imaging shows no acute intracranial abnormality. Husband was frustrated that we do not know what the cause of her AMS is.  He feels it is medication related. -Neurology consulted and appreciated. ?dementia workup as an outpatient -EEG was abnormal, moderate encephalopathy  Staph agalactiae/Yeast UTI / Coagulase negative bacteremia  -Has completed a course of rocephin - no sx of ongoing infection  -Please note blood cultures with staph species coagulase negative which suggests contamination -Repeat Blood cultures 10/16/15 show no growth -Patient completed 11 days of fluconazole   Occult GI bleed / acute blood loss anemia in the setting of supratherapeutic INR -Per prior documentation, patient's husband did not want a GI evaluation at this time -Patient has received 3 units of PRBC transfusion during this hospital stay -No reports of bleeding while on Coumadin  -Off note, she was given vitamin K on admission for supratherapeutic INR which will corrected INR -Hemoglobin 8.9 today (drop likely diluational)  Acute exacerbation of chronic grade 2 diastolic CHF -Baseline weight 93 kg 07/27/2015  -Weight in past 48 hours: 84.8 kg --> 83.9 kg . Today 80.74kg -Diuretics held due to worsening renal function -Monitor intake/output, daily weights -Patient does have incontinence.   Acute on Chronic kidney disease stage IV -Creatinine at 2.56 on 10/19/2015  -Cr peaked 3.96, torsemide was discontinued on 5/30, will discontinue metolazone today -IVF discontinued on 6/2 -Creatinine today 2.46 (back at  baseline) -Continue to monitor BMP  Paroxysmal Atrial fibrillation/ Supratherapuetic INR -CHADS vasc score 4 -Per patient's husband, wanted to resume Coumadin so this was done and so  far tolerates well  -INR 4.95, dosing per pharmacy (spoke with pharmacy, no coumadin given over he last 6 days, ?effect of fluconazole) -Rate controlled with metoprolol  -Will give one dose of oral Vitamin K  Essential hypertension -Continue metoprolol, imdur, hydralazine   OSA -CPAP QHS   Hypothyroidism -Continue Synthroid  -TSH normal 10/05/15  History of DVT -Most recent duplex in 2014 was negative for DVT -LE doppler 5/16 - no DVT  Obesity  -Body mass index is 30.68 kg/(m^2).  DVT Prophylaxis  Coumadin  Code Status: DNR  Family Communication:  None at bedside.  Disposition Plan: Admitted. Likely discharge to SNF this weekend pending improvement in INR  Consultants Cardiology Gastroenterology Neurology  Procedures  LE doppler 10/08/15, neg for DVT PICC placed on 10/05/15 EEG  Antibiotics   Anti-infectives    Start     Dose/Rate Route Frequency Ordered Stop   10/19/15 1000  fluconazole (DIFLUCAN) tablet 100 mg  Status:  Discontinued     100 mg Oral Daily 10/18/15 1632 10/26/15 1040   10/17/15 1100  fluconazole (DIFLUCAN) IVPB 100 mg  Status:  Discontinued     100 mg 50 mL/hr over 60 Minutes Intravenous Every 24 hours 10/17/15 0841 10/18/15 1632   10/15/15 1100  fluconazole (DIFLUCAN) IVPB 200 mg  Status:  Discontinued     200 mg 100 mL/hr over 60 Minutes Intravenous Every 24 hours 10/15/15 1034 10/17/15 0841   10/06/15 0915  cefTRIAXone (ROCEPHIN) 1 g in dextrose 5 % 50 mL IVPB     1 g 100 mL/hr over 30 Minutes Intravenous Every 24 hours 10/06/15 0913 10/11/15 1020      Subjective:   Wendy Cole seen and examined today.  Still confused.  Patient awake with no complaints. Denies chest pain or shortness of breath.   Objective:   Filed Vitals:   10/25/15 2233 10/26/15 0002 10/26/15 0500 10/26/15 0627  BP:  157/48  170/56  Pulse: 68   66  Temp:    98.4 F (36.9 C)  TempSrc:    Oral  Resp: 17     Height:      Weight:   81.647 kg (180 lb)     SpO2: 96%   96%    Intake/Output Summary (Last 24 hours) at 10/26/15 1040 Last data filed at 10/26/15 0900  Gross per 24 hour  Intake    240 ml  Output      0 ml  Net    240 ml   Filed Weights   10/24/15 0533 10/24/15 2133 10/26/15 0500  Weight: 84.369 kg (186 lb) 80.74 kg (178 lb) 81.647 kg (180 lb)    Exam  General: Well developed, well nourished, NAD  HEENT: NCAT, mucous membranes moist.   Cardiovascular: S1 S2 auscultated, 2/6 SEM, RRR  Respiratory: Clear to auscultation   Abdomen: Soft, nontender, nondistended, + bowel sounds  Extremities: warm dry without cyanosis clubbing or edema  Neuro: AAOx1 (self only), answers some questions, follows commands, confused vs dementia  Data Reviewed: I have personally reviewed following labs and imaging studies  CBC:  Recent Labs Lab 10/22/15 0515 10/23/15 0450 10/24/15 0550 10/25/15 0355 10/26/15 0530  WBC 7.7 8.2 7.3 8.6 6.5  HGB 9.3* 9.3* 8.3* 8.9* 8.8*  HCT 33.5* 33.5* 29.7* 31.8* 31.3*  MCV 79.6 79.8 78.8 80.5 78.4  PLT 423* 443* 439* 418* 123XX123*   Basic Metabolic Panel:  Recent Labs Lab 10/22/15 0515 10/23/15 0450 10/24/15 0550 10/25/15 0355 10/26/15 0530  NA 139 137 132* 136 136  K 3.7 4.2 4.1 4.2 4.0  CL 98* 97* 97* 98* 99*  CO2 25 27 24 24 26   GLUCOSE 91 111* 85 96 96  BUN 56* 60* 61* 59* 57*  CREATININE 3.15* 3.96* 3.36* 2.92* 2.46*  CALCIUM 8.8* 8.4* 7.5* 8.1* 8.5*   GFR: Estimated Creatinine Clearance: 20.6 mL/min (by C-G formula based on Cr of 2.46). Liver Function Tests:  Recent Labs Lab 10/23/15 0450 10/25/15 0355  AST 39 31  ALT 21 20  ALKPHOS 154* 148*  BILITOT 1.4* 1.2  PROT 5.7* 5.5*  ALBUMIN 2.4* 2.2*   No results for input(s): LIPASE, AMYLASE in the last 168 hours. No results for input(s): AMMONIA in the last 168 hours. Coagulation Profile:  Recent Labs Lab 10/22/15 0515 10/23/15 0450 10/24/15 0550 10/25/15 0355 10/26/15 0530  INR 3.63* 3.73* 4.17* 4.11* 4.95*    Cardiac Enzymes: No results for input(s): CKTOTAL, CKMB, CKMBINDEX, TROPONINI in the last 168 hours. BNP (last 3 results)  Recent Labs  07/22/15 1503  PROBNP >5000.0*   HbA1C: No results for input(s): HGBA1C in the last 72 hours. CBG: No results for input(s): GLUCAP in the last 168 hours. Lipid Profile: No results for input(s): CHOL, HDL, LDLCALC, TRIG, CHOLHDL, LDLDIRECT in the last 72 hours. Thyroid Function Tests: No results for input(s): TSH, T4TOTAL, FREET4, T3FREE, THYROIDAB in the last 72 hours. Anemia Panel: No results for input(s): VITAMINB12, FOLATE, FERRITIN, TIBC, IRON, RETICCTPCT in the last 72 hours. Urine analysis:    Component Value Date/Time   COLORURINE YELLOW 10/05/2015 1233   APPEARANCEUR TURBID* 10/05/2015 1233   LABSPEC 1.019 10/05/2015 1233   LABSPEC 1.010 06/21/2013 1213   PHURINE 5.0 10/05/2015 1233   PHURINE 6.0 06/21/2013 1213   GLUCOSEU NEGATIVE 10/05/2015 1233   GLUCOSEU NEGATIVE 03/20/2014 1454   GLUCOSEU Negative 06/21/2013 1213   HGBUR LARGE* 10/05/2015 1233   HGBUR Negative 06/21/2013 1213   BILIRUBINUR NEGATIVE 10/05/2015 1233   BILIRUBINUR Negative 06/21/2013 1213   KETONESUR NEGATIVE 10/05/2015 1233   KETONESUR Negative 06/21/2013 1213   PROTEINUR 100* 10/05/2015 1233   PROTEINUR 100 06/21/2013 1213   UROBILINOGEN 0.2 03/20/2014 1454   UROBILINOGEN 0.2 06/21/2013 1213   NITRITE NEGATIVE 10/05/2015 1233   NITRITE Negative 06/21/2013 1213   LEUKOCYTESUR LARGE* 10/05/2015 1233   LEUKOCYTESUR Negative 06/21/2013 1213   Sepsis Labs: @LABRCNTIP (procalcitonin:4,lacticidven:4)  ) Recent Results (from the past 240 hour(s))  Culture, blood (routine x 2)     Status: None   Collection Time: 10/16/15  3:52 PM  Result Value Ref Range Status   Specimen Description BLOOD RIGHT HAND  Final   Special Requests IN PEDIATRIC BOTTLE 3CC  Final   Culture NO GROWTH 5 DAYS  Final   Report Status 10/21/2015 FINAL  Final  Culture, blood (routine  x 2)     Status: None   Collection Time: 10/16/15  3:57 PM  Result Value Ref Range Status   Specimen Description BLOOD RIGHT HAND  Final   Special Requests IN PEDIATRIC BOTTLE 3CC  Final   Culture NO GROWTH 5 DAYS  Final   Report Status 10/21/2015 FINAL  Final      Radiology Studies: No results found.   Scheduled Meds: . allopurinol  100 mg Oral Daily  . antiseptic oral rinse  7 mL Mouth Rinse  q12n4p  . aspirin EC  81 mg Oral Daily  . brimonidine  1 drop Both Eyes BID  . buPROPion  150 mg Oral Daily  . chlorhexidine  15 mL Mouth Rinse BID  . cholecalciferol  1,000 Units Oral BID  . hydrALAZINE  25 mg Oral Q6H  . isosorbide mononitrate  60 mg Oral Daily  . levothyroxine  137 mcg Oral QAC breakfast  . metoprolol tartrate  50 mg Oral BID  . mometasone-formoterol  2 puff Inhalation BID  . pantoprazole  40 mg Oral BID  . polyethylene glycol  17 g Oral Daily  . QUEtiapine  12.5 mg Oral QHS  . timolol  1 drop Both Eyes BID  . Warfarin - Pharmacist Dosing Inpatient   Does not apply q1800   Continuous Infusions:     LOS: 21 days   Time Spent in minutes   30 minutes  Xaivier Malay D.O. on 10/26/2015 at 10:40 AM  Between 7am to 7pm - Pager - [phone]  After 7pm go to [email or web address] - password [email or web address]  And look for the night coverage person covering for me after hours  [facility] Office  [phone]

## 2015-10-26 NOTE — Progress Notes (Signed)
ANTICOAGULATION CONSULT NOTE - Follow Up Consult  Pharmacy Consult for Coumadin Indication: atrial fibrillation  Allergies  Allergen Reactions  . Amlodipine     edema  . Atorvastatin Other (See Comments)    Muscle aches  . Colesevelam Other (See Comments)    unknown  . Coumadin [Warfarin Sodium]     "causes cramps" reaction to brand name only per pt  . Lasix [Furosemide] Other (See Comments)    Muscle cramps  . Statins Other (See Comments)    Muscle aches  . Tape Rash    Patient Measurements: Height: 5\' 6"  (167.6 cm) Weight: 180 lb (81.647 kg) IBW/kg (Calculated) : 59.3   Vital Signs: Temp: 98.4 F (36.9 C) (06/03 0627) Temp Source: Oral (06/03 0627) BP: 170/56 mmHg (06/03 0627) Pulse Rate: 66 (06/03 0627)  Labs:  Recent Labs  10/24/15 0550 10/25/15 0355 10/26/15 0530  HGB 8.3* 8.9* 8.8*  HCT 29.7* 31.8* 31.3*  PLT 439* 418* 475*  LABPROT 39.2* 38.8* 44.6*  INR 4.17* 4.11* 4.95*  CREATININE 3.36* 2.92* 2.46*    Estimated Creatinine Clearance: 20.6 mL/min (by C-G formula based on Cr of 2.46).   Medications:  Scheduled:  . allopurinol  100 mg Oral Daily  . antiseptic oral rinse  7 mL Mouth Rinse q12n4p  . aspirin EC  81 mg Oral Daily  . brimonidine  1 drop Both Eyes BID  . buPROPion  150 mg Oral Daily  . chlorhexidine  15 mL Mouth Rinse BID  . cholecalciferol  1,000 Units Oral BID  . fluconazole  100 mg Oral Daily  . hydrALAZINE  25 mg Oral Q6H  . isosorbide mononitrate  60 mg Oral Daily  . levothyroxine  137 mcg Oral QAC breakfast  . metoprolol tartrate  50 mg Oral BID  . mometasone-formoterol  2 puff Inhalation BID  . pantoprazole  40 mg Oral BID  . polyethylene glycol  17 g Oral Daily  . QUEtiapine  12.5 mg Oral QHS  . timolol  1 drop Both Eyes BID  . Warfarin - Pharmacist Dosing Inpatient   Does not apply q1800    Assessment: 80yo female on Coumadin 3mg  daily PTA for hx of DVT but last anticoag visit on 5/12 showed an INR of 6.7 so was  holding dose. Resumed 5/24 at a lower dose. Lower GIB on admit with INR 5.13.  INR continues to trend now up to 4.95? Unsure why it continues to climb, fluconazole can elevate INR. Dose has been held since 5/29. Would expect INR to start trending back down soon. Hgb stable at 8.8, plts 475.  Goal of Therapy:  INR 2-3 Monitor platelets by anticoagulation protocol: Yes  Plan:  No Coumadin today Monitor daily INR, CBC, s/s of bleed  Wendy Cole, PharmD, [facility] Clinical Pharmacist Pager [phone] 10/26/2015 9:43 AM

## 2015-10-27 ENCOUNTER — Encounter: Payer: Self-pay | Admitting: Internal Medicine

## 2015-10-27 LAB — CBC
HCT: 30.4 % — ABNORMAL LOW (ref 36.0–46.0)
HEMOGLOBIN: 8.7 g/dL — AB (ref 12.0–15.0)
MCH: 22.4 pg — ABNORMAL LOW (ref 26.0–34.0)
MCHC: 28.6 g/dL — ABNORMAL LOW (ref 30.0–36.0)
MCV: 78.4 fL (ref 78.0–100.0)
Platelets: 402 10*3/uL — ABNORMAL HIGH (ref 150–400)
RBC: 3.88 MIL/uL (ref 3.87–5.11)
RDW: 24.9 % — ABNORMAL HIGH (ref 11.5–15.5)
WBC: 6.6 10*3/uL (ref 4.0–10.5)

## 2015-10-27 LAB — BASIC METABOLIC PANEL
Anion gap: 13 (ref 5–15)
BUN: 61 mg/dL — ABNORMAL HIGH (ref 6–20)
CHLORIDE: 99 mmol/L — AB (ref 101–111)
CO2: 24 mmol/L (ref 22–32)
Calcium: 8.5 mg/dL — ABNORMAL LOW (ref 8.9–10.3)
Creatinine, Ser: 2.31 mg/dL — ABNORMAL HIGH (ref 0.44–1.00)
GFR calc non Af Amer: 19 mL/min — ABNORMAL LOW (ref >60)
GFR, EST AFRICAN AMERICAN: 22 mL/min — AB (ref >60)
Glucose, Bld: 102 mg/dL — ABNORMAL HIGH (ref 65–99)
POTASSIUM: 3.8 mmol/L (ref 3.5–5.1)
SODIUM: 136 mmol/L (ref 135–145)

## 2015-10-27 LAB — PROTIME-INR
INR: 5.43 — AB (ref 0.00–1.49)
PROTHROMBIN TIME: 48.4 s — AB (ref 11.6–15.2)

## 2015-10-27 MED ORDER — PHYTONADIONE 5 MG PO TABS
5.0000 mg | ORAL_TABLET | Freq: Once | ORAL | Status: AC
  Administered 2015-10-27: 5 mg via ORAL
  Filled 2015-10-27: qty 1

## 2015-10-27 MED ORDER — LIDOCAINE 5 % EX PTCH
1.0000 | MEDICATED_PATCH | CUTANEOUS | Status: DC
  Administered 2015-10-27 – 2015-10-29 (×3): 1 via TRANSDERMAL
  Filled 2015-10-27 (×3): qty 1

## 2015-10-27 NOTE — Progress Notes (Signed)
Patient having head and neck pain, not willing to wear CPAP at this time, Patient comfortable without and RN will call if any changes and CPAP needed

## 2015-10-27 NOTE — Progress Notes (Signed)
PROGRESS NOTE    Wendy Cole  [id number] DOB: 1936/03/28 DOA: 10/05/2015 PCP: Walker Kehr, MD   Brief Narrative:  HPI On 10/05/2015 by Dr. Dia Crawford Wendy Cole is a 80 y.o. WF PMHx Anxiety, Depression,CVA, CAD native artery, Paroxysmal Atrial Fibrillation on Warfarin Rx, Chronic Diastolic CHF/Cardiomyopathy, NSTEMI, HTN, Stage III CKD, Hypothyroid, OSA and hx of Breast Cancer S/P Rt. Mastectomy HLD, DVT,  Who presents to the ED , presents to the Emergency Department today complaining of BLE swelling and shortness of breath. Noted increasing weakness since DC from hospital on 07-30-15 due to syncopal episode. Seen Neurology on 09-05-15 for follow up with no neurologic cause. Pt notes inability to ambulate due to weakness and called EMS as she was unable to get off of the toilet. Started using wheelchair in the past week due to weakness. Pt also notes right shoulder pain s/p mechanical fall earlier this week with no head trauma. Notes pain is 5/10 and aching. Pt has Chest tightness with SOB. No CP. No N/V/D. No headaches. No fevers. No hematochezia. No black tarry stools. No other symptoms noted.  Occult blood positive In ED. Patient has not noticed blood in urine or Stool.states allergic to Lasix will cause her extreme muscle spasms ~ 30-45 minutes post administration..states baseline weight is ~218 pounds(99 kg). States does not weigh herself daily  Assessment & Plan  Obtundation / Toxic metabolic encephalopathy / yeast UTI / Agitation / Psychosis  -Probably related to UTI, urine culture grew yeast and she is treated with fluconazole -B12, folate, and ammonia recently normal  -CT and MRI brain negative for acute intracranial abnormality   -Patient was seen by psychiatry and their thought is that patient may have had steroid induced delirium but currently she is off of steroids and mental status still not improved -Continue Seroquel 12.5 mg at bedtime -Ativan and haldol  PRN for restlessness and / or agitation  -Had long conversation with husband today about mental status.  Likely unrelated to steroids.  Repeat Blood cultures negative.  Possibly due to UTI, which is being treated. Brain imaging shows no acute intracranial abnormality. Husband was frustrated that we do not know what the cause of her AMS is.  He feels it is medication related. -Neurology consulted and appreciated. ?dementia workup as an outpatient -EEG was abnormal, moderate encephalopathy  Staph agalactiae/Yeast UTI / Coagulase negative bacteremia  -Has completed a course of rocephin - no sx of ongoing infection  -Please note blood cultures with staph species coagulase negative which suggests contamination -Repeat Blood cultures 10/16/15 show no growth -Patient completed 11 days of fluconazole   Occult GI bleed / acute blood loss anemia in the setting of supratherapeutic INR -Per prior documentation, patient's husband did not want a GI evaluation at this time -Patient has received 3 units of PRBC transfusion during this hospital stay -No reports of bleeding while on Coumadin  -Off note, she was given vitamin K on admission for supratherapeutic INR which will corrected INR -Hemoglobin 8.7 today (drop likely diluational)  Acute exacerbation of chronic grade 2 diastolic CHF -Baseline weight 93 kg 07/27/2015  -Weight in past 48 hours: 84.8 kg --> 83.9 kg . Today 80.74kg -Diuretics held due to worsening renal function -Monitor intake/output, daily weights -Patient does have incontinence.   Acute on Chronic kidney disease stage IV -Creatinine at 2.56 on 10/19/2015  -Cr peaked 3.96, torsemide was discontinued on 5/30, will discontinue metolazone today -IVF discontinued on 6/2 -Creatinine today 2.31 (back at  baseline) -Continue to monitor BMP  Paroxysmal Atrial fibrillation/ Supratherapuetic INR -CHADS vasc score 4 -Per patient's husband, wanted to resume Coumadin so this was done and so  far tolerates well  -INR 5.43 dosing per pharmacy (spoke with pharmacy, no coumadin given over he last 6 days, ?effect of fluconazole) -Rate controlled with metoprolol  -Vit K (oral) ordered   Essential hypertension -Continue metoprolol, imdur, hydralazine   OSA -CPAP QHS   Hypothyroidism -Continue Synthroid  -TSH normal 10/05/15  History of DVT -Most recent duplex in 2014 was negative for DVT -LE doppler 5/16 - no DVT  Obesity  -Body mass index is 30.68 kg/(m^2).  DVT Prophylaxis  Coumadin, supratherapeutic INR  Code Status: DNR  Family Communication:  None at bedside.  Disposition Plan: Admitted. Pending improvement in INR  Consultants Cardiology Gastroenterology Neurology  Procedures  LE doppler 10/08/15, neg for DVT PICC placed on 10/05/15 EEG  Antibiotics   Anti-infectives    Start     Dose/Rate Route Frequency Ordered Stop   10/19/15 1000  fluconazole (DIFLUCAN) tablet 100 mg  Status:  Discontinued     100 mg Oral Daily 10/18/15 1632 10/26/15 1040   10/17/15 1100  fluconazole (DIFLUCAN) IVPB 100 mg  Status:  Discontinued     100 mg 50 mL/hr over 60 Minutes Intravenous Every 24 hours 10/17/15 0841 10/18/15 1632   10/15/15 1100  fluconazole (DIFLUCAN) IVPB 200 mg  Status:  Discontinued     200 mg 100 mL/hr over 60 Minutes Intravenous Every 24 hours 10/15/15 1034 10/17/15 0841   10/06/15 0915  cefTRIAXone (ROCEPHIN) 1 g in dextrose 5 % 50 mL IVPB     1 g 100 mL/hr over 30 Minutes Intravenous Every 24 hours 10/06/15 0913 10/11/15 1020      Subjective:   Wendy Cole seen and examined today.  Still confused.  Patient awake with no complaints. Denies chest pain or shortness of breath, abdominal pain.  Objective:   Filed Vitals:   10/26/15 1931 10/26/15 2132 10/26/15 2241 10/27/15 0537  BP:  148/54  152/56  Pulse:  67 72 68  Temp:  98 F (36.7 C)  97.9 F (36.6 C)  TempSrc:  Oral  Oral  Resp:  17 20 18   Height:      Weight:    82.4 kg  (181 lb 10.5 oz)  SpO2: 95% 96% 96% 95%    Intake/Output Summary (Last 24 hours) at 10/27/15 1141 Last data filed at 10/27/15 0936  Gross per 24 hour  Intake    120 ml  Output      0 ml  Net    120 ml   Filed Weights   10/24/15 2133 10/26/15 0500 10/27/15 0537  Weight: 80.74 kg (178 lb) 81.647 kg (180 lb) 82.4 kg (181 lb 10.5 oz)    Exam  General: Well developed, well nourished, no distress  HEENT: NCAT, mucous membranes moist.   Cardiovascular: S1 S2 auscultated, 2/6 SEM, RRR  Respiratory: Clear to auscultation   Abdomen: Soft, nontender, nondistended, + bowel sounds  Extremities: warm dry without cyanosis clubbing or edema  Neuro: AAOx1 (self only), nonfocal  Data Reviewed: I have personally reviewed following labs and imaging studies  CBC:  Recent Labs Lab 10/23/15 0450 10/24/15 0550 10/25/15 0355 10/26/15 0530 10/27/15 0639  WBC 8.2 7.3 8.6 6.5 6.6  HGB 9.3* 8.3* 8.9* 8.8* 8.7*  HCT 33.5* 29.7* 31.8* 31.3* 30.4*  MCV 79.8 78.8 80.5 78.4 78.4  PLT 443* 439* 418* 475*  [id number]   Basic Metabolic Panel:  Recent Labs Lab 10/23/15 0450 10/24/15 0550 10/25/15 0355 10/26/15 0530 10/27/15 0639  NA 137 132* 136 136 136  K 4.2 4.1 4.2 4.0 3.8  CL 97* 97* 98* 99* 99*  CO2 27 24 24 26 24   GLUCOSE 111* 85 96 96 102*  BUN 60* 61* 59* 57* 61*  CREATININE 3.96* 3.36* 2.92* 2.46* 2.31*  CALCIUM 8.4* 7.5* 8.1* 8.5* 8.5*   GFR: Estimated Creatinine Clearance: 22.1 mL/min (by C-G formula based on Cr of 2.31). Liver Function Tests:  Recent Labs Lab 10/23/15 0450 10/25/15 0355  AST 39 31  ALT 21 20  ALKPHOS 154* 148*  BILITOT 1.4* 1.2  PROT 5.7* 5.5*  ALBUMIN 2.4* 2.2*   No results for input(s): LIPASE, AMYLASE in the last 168 hours. No results for input(s): AMMONIA in the last 168 hours. Coagulation Profile:  Recent Labs Lab 10/23/15 0450 10/24/15 0550 10/25/15 0355 10/26/15 0530 10/27/15 0639  INR 3.73* 4.17* 4.11* 4.95* 5.43*   Cardiac  Enzymes: No results for input(s): CKTOTAL, CKMB, CKMBINDEX, TROPONINI in the last 168 hours. BNP (last 3 results)  Recent Labs  07/22/15 1503  PROBNP >5000.0*   HbA1C: No results for input(s): HGBA1C in the last 72 hours. CBG: No results for input(s): GLUCAP in the last 168 hours. Lipid Profile: No results for input(s): CHOL, HDL, LDLCALC, TRIG, CHOLHDL, LDLDIRECT in the last 72 hours. Thyroid Function Tests: No results for input(s): TSH, T4TOTAL, FREET4, T3FREE, THYROIDAB in the last 72 hours. Anemia Panel: No results for input(s): VITAMINB12, FOLATE, FERRITIN, TIBC, IRON, RETICCTPCT in the last 72 hours. Urine analysis:    Component Value Date/Time   COLORURINE YELLOW 10/05/2015 1233   APPEARANCEUR TURBID* 10/05/2015 1233   LABSPEC 1.019 10/05/2015 1233   LABSPEC 1.010 06/21/2013 1213   PHURINE 5.0 10/05/2015 1233   PHURINE 6.0 06/21/2013 1213   GLUCOSEU NEGATIVE 10/05/2015 1233   GLUCOSEU NEGATIVE 03/20/2014 1454   GLUCOSEU Negative 06/21/2013 1213   HGBUR LARGE* 10/05/2015 1233   HGBUR Negative 06/21/2013 1213   BILIRUBINUR NEGATIVE 10/05/2015 1233   BILIRUBINUR Negative 06/21/2013 1213   KETONESUR NEGATIVE 10/05/2015 1233   KETONESUR Negative 06/21/2013 1213   PROTEINUR 100* 10/05/2015 1233   PROTEINUR 100 06/21/2013 1213   UROBILINOGEN 0.2 03/20/2014 1454   UROBILINOGEN 0.2 06/21/2013 1213   NITRITE NEGATIVE 10/05/2015 1233   NITRITE Negative 06/21/2013 1213   LEUKOCYTESUR LARGE* 10/05/2015 1233   LEUKOCYTESUR Negative 06/21/2013 1213   Sepsis Labs: @LABRCNTIP (procalcitonin:4,lacticidven:4)  ) No results found for this or any previous visit (from the past 240 hour(s)).    Radiology Studies: No results found.   Scheduled Meds: . allopurinol  100 mg Oral Daily  . antiseptic oral rinse  7 mL Mouth Rinse q12n4p  . aspirin EC  81 mg Oral Daily  . brimonidine  1 drop Both Eyes BID  . buPROPion  150 mg Oral Daily  . chlorhexidine  15 mL Mouth Rinse BID    . cholecalciferol  1,000 Units Oral BID  . hydrALAZINE  25 mg Oral Q6H  . isosorbide mononitrate  60 mg Oral Daily  . levothyroxine  137 mcg Oral QAC breakfast  . metoprolol tartrate  50 mg Oral BID  . mometasone-formoterol  2 puff Inhalation BID  . pantoprazole  40 mg Oral BID  . polyethylene glycol  17 g Oral Daily  . QUEtiapine  12.5 mg Oral QHS  . timolol  1 drop Both Eyes BID  .  Warfarin - Pharmacist Dosing Inpatient   Does not apply q1800   Continuous Infusions:     LOS: 22 days   Time Spent in minutes   30 minutes  Mozell Haber D.O. on 10/27/2015 at 11:41 AM  Between 7am to 7pm - Pager - [phone]  After 7pm go to [email or web address] - password [email or web address]  And look for the night coverage person covering for me after hours  [facility] Office  [phone]

## 2015-10-27 NOTE — Progress Notes (Signed)
ANTICOAGULATION CONSULT NOTE - Follow Up Consult  Pharmacy Consult for Coumadin Indication: atrial fibrillation  Allergies  Allergen Reactions  . Amlodipine     edema  . Atorvastatin Other (See Comments)    Muscle aches  . Colesevelam Other (See Comments)    unknown  . Coumadin [Warfarin Sodium]     "causes cramps" reaction to brand name only per pt  . Lasix [Furosemide] Other (See Comments)    Muscle cramps  . Statins Other (See Comments)    Muscle aches  . Tape Rash    Patient Measurements: Height: 5\' 6"  (167.6 cm) Weight: 181 lb 10.5 oz (82.4 kg) IBW/kg (Calculated) : 59.3   Vital Signs: Temp: 97.9 F (36.6 C) (06/04 0537) Temp Source: Oral (06/04 0537) BP: 152/56 mmHg (06/04 0537) Pulse Rate: 68 (06/04 0537)  Labs:  Recent Labs  10/25/15 0355 10/26/15 0530 10/27/15 0639  HGB 8.9* 8.8* 8.7*  HCT 31.8* 31.3* 30.4*  PLT 418* 475* 402*  LABPROT 38.8* 44.6* 48.4*  INR 4.11* 4.95* 5.43*  CREATININE 2.92* 2.46* 2.31*    Estimated Creatinine Clearance: 22.1 mL/min (by C-G formula based on Cr of 2.31).   Medications:  Scheduled:  . allopurinol  100 mg Oral Daily  . antiseptic oral rinse  7 mL Mouth Rinse q12n4p  . aspirin EC  81 mg Oral Daily  . brimonidine  1 drop Both Eyes BID  . buPROPion  150 mg Oral Daily  . chlorhexidine  15 mL Mouth Rinse BID  . cholecalciferol  1,000 Units Oral BID  . hydrALAZINE  25 mg Oral Q6H  . isosorbide mononitrate  60 mg Oral Daily  . levothyroxine  137 mcg Oral QAC breakfast  . metoprolol tartrate  50 mg Oral BID  . mometasone-formoterol  2 puff Inhalation BID  . pantoprazole  40 mg Oral BID  . polyethylene glycol  17 g Oral Daily  . QUEtiapine  12.5 mg Oral QHS  . timolol  1 drop Both Eyes BID  . Warfarin - Pharmacist Dosing Inpatient   Does not apply q1800    Assessment: 80yo female on Coumadin 3mg  daily PTA for hx of DVT but last anticoag visit on 5/12 showed an INR of 6.7 so was holding dose. Resumed 5/24 at a  lower dose. Lower GIB on admit with INR 5.13.  INR continues to trend up, now at 5.43. Unsure why it continues to climb, fluconazole can elevate INR, stopped on 6/3. Dose has been held since 5/29. Would expect INR to start trending back down soon. Hgb stable at 8.7, plts 402.  Goal of Therapy:  INR 2-3 Monitor platelets by anticoagulation protocol: Yes  Plan:  No Coumadin today Monitor daily INR, CBC, s/s of bleed  Diuretics have been held for since 5/29 due to worsening renal function. On torsemide and metolazone PTA. Now that SCr improving would evaluate fluid status, if volume overload again that may be playing a role in elevated INR.  Elenor Quinones, PharmD, BCPS Clinical Pharmacist Pager [phone] 10/27/2015 8:37 AM

## 2015-10-28 LAB — CBC
HEMATOCRIT: 28.2 % — AB (ref 36.0–46.0)
HEMOGLOBIN: 8 g/dL — AB (ref 12.0–15.0)
MCH: 22.2 pg — ABNORMAL LOW (ref 26.0–34.0)
MCHC: 28.4 g/dL — ABNORMAL LOW (ref 30.0–36.0)
MCV: 78.3 fL (ref 78.0–100.0)
Platelets: 427 10*3/uL — ABNORMAL HIGH (ref 150–400)
RBC: 3.6 MIL/uL — AB (ref 3.87–5.11)
RDW: 24.7 % — ABNORMAL HIGH (ref 11.5–15.5)
WBC: 5.6 10*3/uL (ref 4.0–10.5)

## 2015-10-28 LAB — BASIC METABOLIC PANEL
ANION GAP: 10 (ref 5–15)
BUN: 64 mg/dL — ABNORMAL HIGH (ref 6–20)
CHLORIDE: 100 mmol/L — AB (ref 101–111)
CO2: 25 mmol/L (ref 22–32)
Calcium: 8.6 mg/dL — ABNORMAL LOW (ref 8.9–10.3)
Creatinine, Ser: 2.3 mg/dL — ABNORMAL HIGH (ref 0.44–1.00)
GFR calc non Af Amer: 19 mL/min — ABNORMAL LOW (ref >60)
GFR, EST AFRICAN AMERICAN: 22 mL/min — AB (ref >60)
Glucose, Bld: 84 mg/dL (ref 65–99)
Potassium: 3.9 mmol/L (ref 3.5–5.1)
Sodium: 135 mmol/L (ref 135–145)

## 2015-10-28 LAB — PROTIME-INR
INR: 2.31 — AB (ref 0.00–1.49)
Prothrombin Time: 25.2 seconds — ABNORMAL HIGH (ref 11.6–15.2)

## 2015-10-28 MED ORDER — PANTOPRAZOLE SODIUM 40 MG PO TBEC: 40.0000 mg | DELAYED_RELEASE_TABLET | Freq: Two times a day (BID) | ORAL | Status: AC

## 2015-10-28 MED ORDER — ASPIRIN 81 MG PO TBEC: 81.0000 mg | DELAYED_RELEASE_TABLET | Freq: Every day | ORAL | Status: AC

## 2015-10-28 MED ORDER — QUETIAPINE FUMARATE 25 MG PO TABS: 12.5000 mg | ORAL_TABLET | Freq: Every day | ORAL | Status: AC

## 2015-10-28 MED ORDER — METOPROLOL TARTRATE 50 MG PO TABS: 50.0000 mg | ORAL_TABLET | Freq: Two times a day (BID) | ORAL | Status: AC

## 2015-10-28 MED ORDER — HYDRALAZINE HCL 25 MG PO TABS: 25.0000 mg | ORAL_TABLET | Freq: Four times a day (QID) | ORAL | Status: AC

## 2015-10-28 MED ORDER — WARFARIN SODIUM 2 MG PO TABS
2.0000 mg | ORAL_TABLET | Freq: Once | ORAL | Status: AC
  Administered 2015-10-28: 2 mg via ORAL
  Filled 2015-10-28: qty 1

## 2015-10-28 NOTE — Clinical Social Work Note (Signed)
CSW spoke with patient and her husband, they would still like to go to [facility] once she is medically ready for discharge and orders have been received.  CSW contacted [facility] who can still accept patient once she is ready for discharge.  Jones Broom. [place], MSW, [place] 10/28/2015 4:26 PM

## 2015-10-28 NOTE — Progress Notes (Signed)
PROGRESS NOTE    Wendy Cole  [id number] DOB: 12/06/1935 DOA: 10/05/2015 PCP: Walker Kehr, MD   Brief Narrative:  HPI On 10/05/2015 by Dr. Dia Crawford Wendy Cole is a 80 y.o. WF PMHx Anxiety, Depression,CVA, CAD native artery, Paroxysmal Atrial Fibrillation on Warfarin Rx, Chronic Diastolic CHF/Cardiomyopathy, NSTEMI, HTN, Stage III CKD, Hypothyroid, OSA and hx of Breast Cancer S/P Rt. Mastectomy HLD, DVT,  Who presents to the ED , presents to the Emergency Department today complaining of BLE swelling and shortness of breath. Noted increasing weakness since DC from hospital on 07-30-15 due to syncopal episode. Seen Neurology on 09-05-15 for follow up with no neurologic cause. Pt notes inability to ambulate due to weakness and called EMS as she was unable to get off of the toilet. Started using wheelchair in the past week due to weakness. Pt also notes right shoulder pain s/p mechanical fall earlier this week with no head trauma. Notes pain is 5/10 and aching. Pt has Chest tightness with SOB. No CP. No N/V/D. No headaches. No fevers. No hematochezia. No black tarry stools. No other symptoms noted.  Occult blood positive In ED. Patient has not noticed blood in urine or Stool.states allergic to Lasix will cause her extreme muscle spasms ~ 30-45 minutes post administration..states baseline weight is ~218 pounds(99 kg). States does not weigh herself daily  Assessment & Plan  Obtundation / Toxic metabolic encephalopathy / yeast UTI / Agitation / Psychosis  -Probably related to UTI, urine culture grew yeast and she is treated with fluconazole -B12, folate, and ammonia recently normal  -CT and MRI brain negative for acute intracranial abnormality   -Patient was seen by psychiatry and their thought is that patient may have had steroid induced delirium but currently she is off of steroids and mental status still not improved -Continue Seroquel 12.5 mg at bedtime -Ativan and haldol  PRN for restlessness and / or agitation  -Had long conversation with husband today about mental status.  Likely unrelated to steroids.  Repeat Blood cultures negative.  Possibly due to UTI, which is being treated. Brain imaging shows no acute intracranial abnormality. Husband was frustrated that we do not know what the cause of her AMS is.  He feels it is medication related. -Neurology consulted and appreciated. ?dementia workup as an outpatient -EEG was abnormal, moderate encephalopathy  Staph agalactiae/Yeast UTI / Coagulase negative bacteremia  -Has completed a course of rocephin - no sx of ongoing infection  -Please note blood cultures with staph species coagulase negative which suggests contamination -Repeat Blood cultures 10/16/15 show no growth -Patient completed 11 days of fluconazole   Occult GI bleed / acute blood loss anemia in the setting of supratherapeutic INR -Per prior documentation, patient's husband did not want a GI evaluation at this time -Patient has received 3 units of PRBC transfusion during this hospital stay -No reports of bleeding while on Coumadin  -Off note, she was given vitamin K on admission for supratherapeutic INR which will corrected INR -Hemoglobin 8.0 today (drop likely diluational) -Continue to monitor CBC  Acute exacerbation of chronic grade 2 diastolic CHF -Baseline weight 93 kg 07/27/2015  -Weight in past 48 hours: 84.8 kg --> 83.9 kg . Today 81.19kg -Diuretics held due to worsening renal function -Monitor intake/output, daily weights -Patient does have incontinence.   Acute on Chronic kidney disease stage IV -Creatinine at 2.56 on 10/19/2015  -Cr peaked 3.96, torsemide was discontinued on 5/30, will discontinue metolazone today -IVF discontinued on 6/2 -Creatinine  today 2.30 (back at baseline) -Continue to monitor BMP  Paroxysmal Atrial fibrillation/ Supratherapuetic INR -CHADS vasc score 4 -Per patient's husband, wanted to resume  Coumadin so this was done and so far tolerates well  -INR was up to 5.43 dosing per pharmacy (spoke with pharmacy, no coumadin given over he last 6 days, ?effect of fluconazole) -Rate controlled with metoprolol  -Vit K (oral) given on 6/4 -INR today 2.31  Essential hypertension -Continue metoprolol, imdur, hydralazine   OSA -CPAP QHS   Hypothyroidism -Continue Synthroid  -TSH normal 10/05/15  History of DVT -Most recent duplex in 2014 was negative for DVT -LE doppler 5/16 - no DVT  Obesity  -Body mass index is 30.68 kg/(m^2).  DVT Prophylaxis  Coumadin, supratherapeutic INR  Code Status: DNR  Family Communication:  None at bedside.  Disposition Plan: Admitted. Will monitor INR/CBC for additional day. Likely d/c to SNF 10/29/2015.  Consultants Cardiology Gastroenterology Neurology  Procedures  LE doppler 10/08/15, neg for DVT PICC placed on 10/05/15 EEG  Antibiotics   Anti-infectives    Start     Dose/Rate Route Frequency Ordered Stop   10/19/15 1000  fluconazole (DIFLUCAN) tablet 100 mg  Status:  Discontinued     100 mg Oral Daily 10/18/15 1632 10/26/15 1040   10/17/15 1100  fluconazole (DIFLUCAN) IVPB 100 mg  Status:  Discontinued     100 mg 50 mL/hr over 60 Minutes Intravenous Every 24 hours 10/17/15 0841 10/18/15 1632   10/15/15 1100  fluconazole (DIFLUCAN) IVPB 200 mg  Status:  Discontinued     200 mg 100 mL/hr over 60 Minutes Intravenous Every 24 hours 10/15/15 1034 10/17/15 0841   10/06/15 0915  cefTRIAXone (ROCEPHIN) 1 g in dextrose 5 % 50 mL IVPB     1 g 100 mL/hr over 30 Minutes Intravenous Every 24 hours 10/06/15 0913 10/11/15 1020      Subjective:   Wendy Cole seen and examined today.  Still confused. Complains of back pain and leg pain.Denies chest pain or shortness of breath, abdominal pain.  Objective:   Filed Vitals:   10/27/15 2134 10/27/15 2342 10/28/15 0529 10/28/15 0752  BP: 172/45 143/47 155/59   Pulse: 63 61 63   Temp: 98  F (36.7 C)  98.6 F (37 C)   TempSrc: Oral  Oral   Resp: 17  18   Height:      Weight:   81.194 kg (179 lb)   SpO2: 96%  99% 99%    Intake/Output Summary (Last 24 hours) at 10/28/15 1123 Last data filed at 10/28/15 0836  Gross per 24 hour  Intake    290 ml  Output      0 ml  Net    290 ml   Filed Weights   10/26/15 0500 10/27/15 0537 10/28/15 0529  Weight: 81.647 kg (180 lb) 82.4 kg (181 lb 10.5 oz) 81.194 kg (179 lb)    Exam  General: Well developed, well nourished, NAD  HEENT: NCAT, mucous membranes moist.   Cardiovascular: S1 S2 auscultated, 2/6 SEM, RRR  Respiratory: Diminished but clear   Abdomen: Soft, nontender, nondistended, + bowel sounds  Extremities: warm dry without cyanosis clubbing. Dressing place on LLE  Neuro: AAOx1 (self only), nonfocal  Data Reviewed: I have personally reviewed following labs and imaging studies  CBC:  Recent Labs Lab 10/24/15 0550 10/25/15 0355 10/26/15 0530 10/27/15 0639 10/28/15 0524  WBC 7.3 8.6 6.5 6.6 5.6  HGB 8.3* 8.9* 8.8* 8.7* 8.0*  HCT 29.7*  31.8* 31.3* 30.4* 28.2*  MCV 78.8 80.5 78.4 78.4 78.3  PLT 439* 418* 475* 402* [id number]*   Basic Metabolic Panel:  Recent Labs Lab 10/24/15 0550 10/25/15 0355 10/26/15 0530 10/27/15 0639 10/28/15 0524  NA 132* 136 136 136 135  K 4.1 4.2 4.0 3.8 3.9  CL 97* 98* 99* 99* 100*  CO2 24 24 26 24 25   GLUCOSE 85 96 96 102* 84  BUN 61* 59* 57* 61* 64*  CREATININE 3.36* 2.92* 2.46* 2.31* 2.30*  CALCIUM 7.5* 8.1* 8.5* 8.5* 8.6*   GFR: Estimated Creatinine Clearance: 22 mL/min (by C-G formula based on Cr of 2.3). Liver Function Tests:  Recent Labs Lab 10/23/15 0450 10/25/15 0355  AST 39 31  ALT 21 20  ALKPHOS 154* 148*  BILITOT 1.4* 1.2  PROT 5.7* 5.5*  ALBUMIN 2.4* 2.2*   No results for input(s): LIPASE, AMYLASE in the last 168 hours. No results for input(s): AMMONIA in the last 168 hours. Coagulation Profile:  Recent Labs Lab 10/24/15 0550 10/25/15 0355  10/26/15 0530 10/27/15 0639 10/28/15 0524  INR 4.17* 4.11* 4.95* 5.43* 2.31*   Cardiac Enzymes: No results for input(s): CKTOTAL, CKMB, CKMBINDEX, TROPONINI in the last 168 hours. BNP (last 3 results)  Recent Labs  07/22/15 1503  PROBNP >5000.0*   HbA1C: No results for input(s): HGBA1C in the last 72 hours. CBG: No results for input(s): GLUCAP in the last 168 hours. Lipid Profile: No results for input(s): CHOL, HDL, LDLCALC, TRIG, CHOLHDL, LDLDIRECT in the last 72 hours. Thyroid Function Tests: No results for input(s): TSH, T4TOTAL, FREET4, T3FREE, THYROIDAB in the last 72 hours. Anemia Panel: No results for input(s): VITAMINB12, FOLATE, FERRITIN, TIBC, IRON, RETICCTPCT in the last 72 hours. Urine analysis:    Component Value Date/Time   COLORURINE YELLOW 10/05/2015 1233   APPEARANCEUR TURBID* 10/05/2015 1233   LABSPEC 1.019 10/05/2015 1233   LABSPEC 1.010 06/21/2013 1213   PHURINE 5.0 10/05/2015 1233   PHURINE 6.0 06/21/2013 1213   GLUCOSEU NEGATIVE 10/05/2015 1233   GLUCOSEU NEGATIVE 03/20/2014 1454   GLUCOSEU Negative 06/21/2013 1213   HGBUR LARGE* 10/05/2015 1233   HGBUR Negative 06/21/2013 1213   BILIRUBINUR NEGATIVE 10/05/2015 1233   BILIRUBINUR Negative 06/21/2013 1213   KETONESUR NEGATIVE 10/05/2015 1233   KETONESUR Negative 06/21/2013 1213   PROTEINUR 100* 10/05/2015 1233   PROTEINUR 100 06/21/2013 1213   UROBILINOGEN 0.2 03/20/2014 1454   UROBILINOGEN 0.2 06/21/2013 1213   NITRITE NEGATIVE 10/05/2015 1233   NITRITE Negative 06/21/2013 1213   LEUKOCYTESUR LARGE* 10/05/2015 1233   LEUKOCYTESUR Negative 06/21/2013 1213   Sepsis Labs: @LABRCNTIP (procalcitonin:4,lacticidven:4)  ) No results found for this or any previous visit (from the past 240 hour(s)).    Radiology Studies: No results found.   Scheduled Meds: . allopurinol  100 mg Oral Daily  . antiseptic oral rinse  7 mL Mouth Rinse q12n4p  . aspirin EC  81 mg Oral Daily  . brimonidine  1  drop Both Eyes BID  . buPROPion  150 mg Oral Daily  . chlorhexidine  15 mL Mouth Rinse BID  . cholecalciferol  1,000 Units Oral BID  . hydrALAZINE  25 mg Oral Q6H  . isosorbide mononitrate  60 mg Oral Daily  . levothyroxine  137 mcg Oral QAC breakfast  . lidocaine  1 patch Transdermal Q24H  . metoprolol tartrate  50 mg Oral BID  . mometasone-formoterol  2 puff Inhalation BID  . pantoprazole  40 mg Oral BID  . polyethylene glycol  17 g Oral Daily  . QUEtiapine  12.5 mg Oral QHS  . timolol  1 drop Both Eyes BID  . Warfarin - Pharmacist Dosing Inpatient   Does not apply q1800   Continuous Infusions:     LOS: 23 days   Time Spent in minutes   30 minutes  Wendy Cole D.O. on 10/28/2015 at 11:23 AM  Between 7am to 7pm - Pager - [phone]  After 7pm go to [email or web address] - password [email or web address]  And look for the night coverage person covering for me after hours  [facility] Office  [phone]

## 2015-10-28 NOTE — Care Management Important Message (Signed)
Important Message  Patient Details  Name: Wendy Cole MRN: [id number] Date of Birth: 1935/07/27   Medicare Important Message Given:  Yes    Denette Hass, Leroy Sea 10/28/2015, 12:11 PM

## 2015-10-28 NOTE — Consult Note (Signed)
   [facility] CM Inpatient Consult   10/28/2015  Wendy Cole 04-16-1936 [id number]   [facility] Care Management follow up. Chart reviewed. Noted discharge plans continue to be for SNF at this time. Will not attempt to engage for [place] Management program. If discharge planning needs change, please consider placing [place] Management consult.  Marthenia Rolling, MSN-Ed, RN,BSN [facility] Liaison [phone]

## 2015-10-28 NOTE — Progress Notes (Signed)
RT attempted to place pt. Mask on for cpap but pt. Complained of the mask hurting her face and she decided she didn't want to wear it tonight. RT placed pt. Nasal cannula back on her.

## 2015-10-28 NOTE — Progress Notes (Signed)
ANTICOAGULATION CONSULT NOTE - Follow Up Consult  Pharmacy Consult:  Coumadin Indication: atrial fibrillation  Allergies  Allergen Reactions  . Amlodipine     edema  . Atorvastatin Other (See Comments)    Muscle aches  . Colesevelam Other (See Comments)    unknown  . Coumadin [Warfarin Sodium]     "causes cramps" reaction to brand name only per pt  . Lasix [Furosemide] Other (See Comments)    Muscle cramps  . Statins Other (See Comments)    Muscle aches  . Tape Rash    Patient Measurements: Height: 5\' 6"  (167.6 cm) Weight: 179 lb (81.194 kg) IBW/kg (Calculated) : 59.3  Vital Signs: Temp: 98.6 F (37 C) (06/05 0529) Temp Source: Oral (06/05 0529) BP: 155/59 mmHg (06/05 0529) Pulse Rate: 63 (06/05 0529)  Labs:  Recent Labs  10/26/15 0530 10/27/15 0639 10/28/15 0524  HGB 8.8* 8.7* 8.0*  HCT 31.3* 30.4* 28.2*  PLT 475* 402* 427*  LABPROT 44.6* 48.4* 25.2*  INR 4.95* 5.43* 2.31*  CREATININE 2.46* 2.31* 2.30*    Estimated Creatinine Clearance: 22 mL/min (by C-G formula based on Cr of 2.3).    Assessment: 39 YOF with history of Afib and DVT to continue on Coumadin.  Of note, patient had elevated INRs PTA and upon admission.  She also has a lower GIB on admit and patient's husband decline GI evaluation.  Coumadin has been on hold since 10/21/15, yet patient's INR kept trending up, likely due to DDI with fluconazole and limited PO intake.  Her last dose of fluconazole was on 10/26/15 and she also received Vitamin K 5mg  PO yesterday.  Patient's INR has trended down significantly to therapeutic level today.  No further bleeding reported.   Goal of Therapy:  INR 2-3   Plan:  - Coumadin 2mg  PO today - Daily PT / INR - Monitor closely for bleeding   Sharlot Sturkey D. Mina Marble, PharmD, BCPS Pager:  [phone] 10/28/2015, 1:47 PM

## 2015-10-29 DIAGNOSIS — I2510 Atherosclerotic heart disease of native coronary artery without angina pectoris: Secondary | ICD-10-CM

## 2015-10-29 LAB — CBC
HCT: 28.7 % — ABNORMAL LOW (ref 36.0–46.0)
Hemoglobin: 8 g/dL — ABNORMAL LOW (ref 12.0–15.0)
MCH: 21.9 pg — ABNORMAL LOW (ref 26.0–34.0)
MCHC: 27.9 g/dL — ABNORMAL LOW (ref 30.0–36.0)
MCV: 78.6 fL (ref 78.0–100.0)
Platelets: 431 10*3/uL — ABNORMAL HIGH (ref 150–400)
RBC: 3.65 MIL/uL — AB (ref 3.87–5.11)
RDW: 24.5 % — ABNORMAL HIGH (ref 11.5–15.5)
WBC: 6.7 10*3/uL (ref 4.0–10.5)

## 2015-10-29 LAB — PROTIME-INR
INR: 1.61 — ABNORMAL HIGH (ref 0.00–1.49)
PROTHROMBIN TIME: 19.2 s — AB (ref 11.6–15.2)

## 2015-10-29 MED ORDER — WARFARIN SODIUM 4 MG PO TABS
4.0000 mg | ORAL_TABLET | Freq: Once | ORAL | Status: AC
  Administered 2015-10-29: 4 mg via ORAL
  Filled 2015-10-29: qty 1

## 2015-10-29 NOTE — Progress Notes (Signed)
ANTICOAGULATION CONSULT NOTE - Follow Up Consult  Pharmacy Consult:  Coumadin Indication: atrial fibrillation  Allergies  Allergen Reactions  . Amlodipine     edema  . Atorvastatin Other (See Comments)    Muscle aches  . Colesevelam Other (See Comments)    unknown  . Coumadin [Warfarin Sodium]     "causes cramps" reaction to brand name only per pt  . Lasix [Furosemide] Other (See Comments)    Muscle cramps  . Statins Other (See Comments)    Muscle aches  . Tape Rash    Patient Measurements: Height: 5\' 6"  (167.6 cm) Weight: 185 lb (83.915 kg) IBW/kg (Calculated) : 59.3  Vital Signs:    Labs:  Recent Labs  10/27/15 0639 10/28/15 0524 10/29/15 0418  HGB 8.7* 8.0* 8.0*  HCT 30.4* 28.2* 28.7*  PLT 402* 427* 431*  LABPROT 48.4* 25.2* 19.2*  INR 5.43* 2.31* 1.61*  CREATININE 2.31* 2.30*  --     Estimated Creatinine Clearance: 22.3 mL/min (by C-G formula based on Cr of 2.3).    Assessment: 54 YOF with history of Afib and DVT to continue on Coumadin.  Of note, patient had elevated INRs PTA and upon admission.  She also has a lower GIB on admit and patient's husband decline GI evaluation.  Coumadin has been on hold since 10/21/15, yet patient's INR kept trending up, likely due to DDI with fluconazole and limited PO intake.  Her last dose of fluconazole was on 10/26/15 and she also received Vitamin K 5mg  PO on 10/27/15.  INR trended down to sub-therapeutic level today.  No bleeding reported.   Goal of Therapy:  INR 2-3    Plan:  - Coumadin 4mg  PO today - Daily PT / INR - Monitor closely for bleeding   Christianjames Soule D. Mina Marble, PharmD, BCPS Pager:  [phone] 10/29/2015, 11:18 AM

## 2015-10-29 NOTE — Progress Notes (Signed)
Physical Therapy Treatment Patient Details Name: Wendy Cole MRN: [id number] DOB: 1936-03-01 Today's Date: 10/29/2015    History of Present Illness This 80 y.o. female was brought to ED when she was unable to get up from the toilet.  In ED pt was grossly volume overloaded and had Guiac + stool.  Hgb 5.8 and INR 5.8.  Dx: UTI, diastolic HF exacerbation, GI bleed, OSA, paroxysmal A-Fib, toxic metabolic encephalopathy w/ increasing confusion likely UTI contributing along w/ steroid induced delirium.[name]  PMH includes:  Anxiety/depression, A-Fib, chronic severe diastolic CHF, HTN, stage III CKD, h/o DVT, OSA, Breast CA, s/p Rt mastectomy.     PT Comments    Attempting to progress mobility and activity tolerance during PT session. Pt willing to sit EOB but refusing to participate in exercises or seated balance tasks. Pt becoming agitated when asked to participate in activity other than sitting. Continue to recommend SNF when medically stable. PT to continue to follow and progress as tolerated.   Follow Up Recommendations  SNF;Supervision/Assistance - 24 hour     Equipment Recommendations  None recommended by PT    Recommendations for Other Services       Precautions / Restrictions Precautions Precautions: Fall Restrictions Weight Bearing Restrictions: No    Mobility  Bed Mobility Overal bed mobility: Needs Assistance Bed Mobility: Supine to Sit;Sit to Supine     Supine to sit: +2 for physical assistance;Total assist Sit to supine: +2 for physical assistance;Total assist   General bed mobility comments: Assist provided at trunk and LEs. Pt attempting to assist with supine to sit. Pt sitting EOB X approx. 7 minutes with variable trunk assist (supervision to mod)  Transfers                 General transfer comment: Unable to safety attempt.   Ambulation/Gait                 Stairs            Wheelchair Mobility    Modified Rankin (Stroke Patients  Only)       Balance Overall balance assessment: Needs assistance Sitting-balance support: Single extremity supported Sitting balance-Leahy Scale: Poor                              Cognition Arousal/Alertness: Awake/alert Behavior During Therapy:  (variable- pleasant, agitated. ) Overall Cognitive Status: Impaired/Different from baseline   Orientation Level: Disoriented to;Place;Situation     Following Commands: Follows one step commands inconsistently Safety/Judgement: Decreased awareness of safety;Decreased awareness of deficits     General Comments: Repeated cues needed for attending to task, pt not consistenly following commands and variable participation with PT. Pt pleasant and laughing/joking and quickly agitated.     Exercises      General Comments General comments (skin integrity, edema, etc.): Attempting to perform exercises at EOB, pt refusing to participate. Also attempting sitting balance activities which were also refused. Pt becoming agitated when asked to participate.       Pertinent Vitals/Pain Pain Assessment: Faces Faces Pain Scale: Hurts a little bit Pain Location: headache Pain Descriptors / Indicators: Aching Pain Intervention(s): Limited activity within patient's tolerance;Monitored during session;Patient requesting pain meds-RN notified    Home Living                      Prior Function            PT Goals (  current goals can now be found in the care plan section) Acute Rehab PT Goals Patient Stated Goal: sit up PT Goal Formulation: With family Time For Goal Achievement: 10/31/15 Potential to Achieve Goals: Fair Progress towards PT goals: Progressing toward goals    Frequency  Min 2X/week    PT Plan Current plan remains appropriate    Co-evaluation             End of Session Equipment Utilized During Treatment: Oxygen Activity Tolerance: Patient limited by fatigue (activity limited by agitation) Patient  left: in bed;with call bell/phone within reach     Time: 0827-0846 PT Time Calculation (min) (ACUTE ONLY): 19 min  Charges:  $Therapeutic Activity: 8-22 mins                    G Codes:      Cassell Clement, PT, CSCS Pager [phone] Office [phone]  10/29/2015, 10:41 AM

## 2015-10-29 NOTE — Progress Notes (Signed)
Pt. Walked in the room while Nt. Was in there and stated they would help pt. Up on the bed

## 2015-10-29 NOTE — Consult Note (Signed)
Subjective:   HPI  The patient is a 80 year old female with multiple medical problems including anxiety, depression, prior CVA, atrial fibrillation, on chronic Coumadin therapy, CHF/cardiomyopathy, prior MI, stage III kidney disease. We were asked to see her in regards to recent heme positive stool found a few weeks ago when she was in the hospital, and a drop in hemoglobin. The patient and family do not report any active bleeding that they are aware of. She had a colonoscopy in 2014 showing diverticulosis.     Past Medical History  Diagnosis Date  . Anemia   . Anxiety states   . Depressive disorder, not elsewhere classified   . Type II or unspecified type diabetes mellitus without mention of complication, not stated as uncontrolled   . Hyperlipidemia   . Unspecified essential hypertension   . Renal insufficiency   . Osteoarthritis   . Gout   . Iritis   . Nonischemic cardiomyopathy (HCC)     EF 20 to 25% per echo 06/2011  . Chronic anticoagulation   . NSTEMI (non-ST elevated myocardial infarction) [facility]) Feb 2013    06/2011 cath  Mild nonobstructive disease. No LV gram  . PAF (paroxysmal atrial fibrillation) ([place])   . Obesity   . Cancer [facility]) 2010    Right breast  . DVT (deep venous thrombosis) ([place]) 2010  . Stroke [facility]) 2004    Left upper lobe  . Thyroid disease 1969    Three fourth of Thyroid removed  . Complication of anesthesia     "fights it", for colonoscopy  . HOH (hard of hearing)     deaf in L completely  . OSA on CPAP     CPAP at night  . GERD (gastroesophageal reflux disease)   . Hypothyroidism   . Peripheral vascular disease [facility]) Jan. 2015   Past Surgical History  Procedure Laterality Date  . Breast lumpectomy    . Cataract extraction    . Foot surgery    . Tonsillectomy  1949  . Abdominal hysterectomy  1974  . Eye surgery  2008    Glaucoma shunt Right eye  . Parotid gland tumor excision  2000  . Cystectomy  1974    Left Breast, chin, left of groin  area  . Thyroidectomy, partial  1969  . Endarterectomy  11/18/2011    rocedure: ENDARTERECTOMY CAROTID;  Surgeon: Conrad Alma, MD;  Location: [place];  Service: Vascular;  Laterality: Left;  [name] Mastectomy  right  . Mastectomy    . Cardiac catheterization      no stent  . Colonoscopy w/ polypectomy    . Endarterectomy  01/12/2012    Procedure: ENDARTERECTOMY CAROTID;  Surgeon: Conrad Redlands, MD;  Location: [facility] OR;  Service: Vascular;  Laterality: Right;  Right carotid endarterectomy with 1cm x 6cm vascuguard patch angioplasty.  . Carotid endarterectomy Left 11-18-11    cea  . Carotid endarterectomy Right 01-12-12    cea  . Left and right heart catheterization with coronary/graft angiogram Left 07/20/2011    Procedure: LEFT AND RIGHT HEART CATHETERIZATION WITH Beatrix Fetters;  Surgeon: Peter M Martinique, MD;  Location: [facility] CATH LAB;  Service: Cardiovascular;  Laterality: Left;   Social History   Social History  . Marital Status: Married    Spouse Name: N/A  . Number of Children: 3  . Years of Education: N/A   Occupational History  . Retired    Social History Main Topics  . Smoking status: Former Smoker -- 2.00  packs/day for 40 years    Types: Cigarettes    Quit date: 05/25/2002  . Smokeless tobacco: Never Used  . Alcohol Use: No  . Drug Use: No  . Sexual Activity: Not Currently   Other Topics Concern  . Not on file   Social History Narrative   Lives with husband and youngest son.     family history includes AAA (abdominal aortic aneurysm) in her brother; Breast cancer in her mother; Congestive Heart Failure (age of onset: 66) in her mother; Coronary artery disease (age of onset: 5) in her son; Heart attack in her father and mother; Heart disease in her brother; Heart disease (age of onset: 47) in her father.  Current facility-administered medications:  .  acetaminophen (TYLENOL) suppository 650 mg, 650 mg, Rectal, Q4H PRN, Cherene Altes, MD, 650 mg at 10/14/15 0053 .   acetaminophen (TYLENOL) tablet 650 mg, 650 mg, Oral, Q4H PRN, Cherene Altes, MD, 650 mg at 10/27/15 2137 .  albuterol (PROVENTIL) (2.5 MG/3ML) 0.083% nebulizer solution 2.5 mg, 2.5 mg, Nebulization, Q2H PRN, Cherene Altes, MD, 2.5 mg at 10/06/15 1733 .  allopurinol (ZYLOPRIM) tablet 100 mg, 100 mg, Oral, Daily, Allie Bossier, MD, 100 mg at 10/29/15 1026 .  antiseptic oral rinse (CPC / CETYLPYRIDINIUM CHLORIDE 0.05%) solution 7 mL, 7 mL, Mouth Rinse, q12n4p, Cherene Altes, MD, 7 mL at 10/29/15 1300 .  aspirin EC tablet 81 mg, 81 mg, Oral, Daily, Allie Bossier, MD, 81 mg at 10/29/15 1026 .  bisacodyl (DULCOLAX) suppository 10 mg, 10 mg, Rectal, Daily PRN, Cherene Altes, MD .  brimonidine (ALPHAGAN) 0.2 % ophthalmic solution 1 drop, 1 drop, Both Eyes, BID, Cherene Altes, MD, 1 drop at 10/29/15 1028 .  buPROPion [facility] SR) 12 hr tablet 150 mg, 150 mg, Oral, Daily, Allie Bossier, MD, 150 mg at 10/29/15 1026 .  chlorhexidine (PERIDEX) 0.12 % solution 15 mL, 15 mL, Mouth Rinse, BID, Cherene Altes, MD, 15 mL at 10/29/15 1026 .  cholecalciferol (VITAMIN D) tablet 1,000 Units, 1,000 Units, Oral, BID, Allie Bossier, MD, 1,000 Units at 10/29/15 1026 .  haloperidol lactate (HALDOL) injection 2 mg, 2 mg, Intravenous, Q6H PRN, Robbie Lis, MD, 2 mg at 10/24/15 2237 .  hydrALAZINE (APRESOLINE) tablet 25 mg, 25 mg, Oral, Q6H, Cherene Altes, MD, 25 mg at 10/29/15 1230 .  isosorbide mononitrate (IMDUR) 24 hr tablet 60 mg, 60 mg, Oral, Daily, Allie Bossier, MD, 60 mg at 10/29/15 1026 .  levothyroxine (SYNTHROID, LEVOTHROID) tablet 137 mcg, 137 mcg, Oral, QAC breakfast, Cherene Altes, MD, 137 mcg at 10/29/15 [phone] .  lidocaine (LIDODERM) 5 % 1 patch, 1 patch, Transdermal, Q24H, Maryann Mikhail, DO, 1 patch at 10/28/15 1820 .  LORazepam (ATIVAN) injection 1 mg, 1 mg, Intravenous, Q4H PRN, Robbie Lis, MD .  magnesium citrate solution 0.5 Bottle, 0.5 Bottle, Oral, Once PRN,  Cherene Altes, MD .  metoprolol (LOPRESSOR) tablet 50 mg, 50 mg, Oral, BID, Cherene Altes, MD, 50 mg at 10/29/15 1025 .  mometasone-formoterol (DULERA) 100-5 MCG/ACT inhaler 2 puff, 2 puff, Inhalation, BID, Allie Bossier, MD, 2 puff at 10/29/15 [phone] .  ondansetron (ZOFRAN) injection 4 mg, 4 mg, Intravenous, Q6H PRN, Allie Bossier, MD, 4 mg at 10/18/15 0659 .  pantoprazole (PROTONIX) EC tablet 40 mg, 40 mg, Oral, BID, Cherene Altes, MD, 40 mg at 10/29/15 1025 .  polyethylene glycol (MIRALAX / GLYCOLAX) packet 17  g, 17 g, Oral, Daily, Dianne Dun, NP, 17 g at 10/29/15 1026 .  prednisoLONE acetate (PRED FORTE) 1 % ophthalmic suspension 1 drop, 1 drop, Both Eyes, Daily PRN, Allie Bossier, MD, 1 drop at 10/23/15 1056 .  QUEtiapine (SEROQUEL) tablet 12.5 mg, 12.5 mg, Oral, QHS, Cherene Altes, MD, 12.5 mg at 10/28/15 2131 .  simethicone (MYLICON) 40 [id number] suspension 40 mg, 40 mg, Oral, Q6H PRN, Cherene Altes, MD .  sodium chloride flush (NS) 0.9 % injection 10-40 mL, 10-40 mL, Intracatheter, PRN, Allie Bossier, MD, 20 mL at 10/29/15 0418 .  timolol (TIMOPTIC) 0.5 % ophthalmic solution 1 drop, 1 drop, Both Eyes, BID, Allie Bossier, MD, 1 drop at 10/29/15 1028 .  traMADol (ULTRAM) tablet 50 mg, 50 mg, Oral, Q6H PRN, Maryann Mikhail, DO, 50 mg at 10/29/15 1558 .  warfarin (COUMADIN) tablet 4 mg, 4 mg, Oral, ONCE-1800, Tyrone Apple, RPH .  Warfarin - Pharmacist Dosing Inpatient, , Does not apply, q1800, Jaquita Folds, RPH, Stopped at 10/24/15 1800 Allergies  Allergen Reactions  . Amlodipine     edema  . Atorvastatin Other (See Comments)    Muscle aches  . Colesevelam Other (See Comments)    unknown  . Coumadin [Warfarin Sodium]     "causes cramps" reaction to brand name only per pt  . Lasix [Furosemide] Other (See Comments)    Muscle cramps  . Statins Other (See Comments)    Muscle aches  . Tape Rash     Objective:     BP 164/57 mmHg  Pulse 66   Temp(Src) 98 F (36.7 C) (Oral)  Resp 18  Ht 5\' 6"  (1.676 m)  Wt 83.915 kg (185 lb)  BMI 29.87 kg/m2  SpO2 100%  She is in no distress  Heart irregular rhythm  Lungs clear  Abdomen soft and nontender  Laboratory No components found for: D1    Assessment:     Heme positive stool recently  Anemia  Multiple medical problems      Plan:     I discussed this at length with her husband and other family members. Considering her health and underlying medical problems we have elected to be conservative and not proceed with further endoscopic evaluation which in her case do pose risks which I went over with them. I would recommend conservative management with supplemental iron therapy, and periodic transfusions if necessary. We will sign off. Call us if needed. Lab Results  Component Value Date   HGB 8.0* 10/29/2015   HGB 8.0* 10/28/2015   HGB 8.7* 10/27/2015   HGB 9.9* 05/02/2015   HGB 12.0 06/28/2014   HGB 11.0* 03/29/2014   HCT 28.7* 10/29/2015   HCT 28.2* 10/28/2015   HCT 30.4* 10/27/2015   HCT 27.0* 07/26/2015   HCT 32.1* 05/02/2015   HCT 37.2 06/28/2014   HCT 34.9 03/29/2014   ALKPHOS 148* 10/25/2015   ALKPHOS 154* 10/23/2015   ALKPHOS 116 10/19/2015   ALKPHOS 78 06/02/2013   AST 31 10/25/2015   AST 39 10/23/2015   AST 33 10/19/2015   AST 17 06/02/2013   ALT 20 10/25/2015   ALT 21 10/23/2015   ALT 22 10/19/2015   ALT 15 06/02/2013

## 2015-10-29 NOTE — Progress Notes (Signed)
Pt. Stated she did not wanted to eat  right now even though she previously stated she was hungry as a bull. NT let her know if she need assistance later to eat to inform the night shift Tech and they will help her

## 2015-10-29 NOTE — Clinical Social Work Note (Signed)
CSW updated patient's family and [facility] that patient is not ready for discharge today.  CSW to continue to follow patient's progress.  Patient and family are still in agreement to going to SNF for short term rehab.  Jones Broom. [place], MSW, [place] 10/29/2015 3:16 PM

## 2015-10-29 NOTE — Progress Notes (Signed)
Family  Member fed Pt.

## 2015-10-29 NOTE — Progress Notes (Signed)
Offered Pt. To reposition her on the bed but Pt. Stated she did not wanted to

## 2015-10-29 NOTE — Progress Notes (Signed)
PROGRESS NOTE    Wendy Cole  [id number] DOB: 1935-08-20 DOA: 10/05/2015 PCP: Walker Kehr, MD   Brief Narrative:  HPI On 10/05/2015 by Dr. Dia Crawford Wendy Cole is a 80 y.o. WF PMHx Anxiety, Depression,CVA, CAD native artery, Paroxysmal Atrial Fibrillation on Warfarin Rx, Chronic Diastolic CHF/Cardiomyopathy, NSTEMI, HTN, Stage III CKD, Hypothyroid, OSA and hx of Breast Cancer S/P Rt. Mastectomy HLD, DVT,  Who presents to the ED , presents to the Emergency Department today complaining of BLE swelling and shortness of breath. Noted increasing weakness since DC from hospital on 07-30-15 due to syncopal episode. Seen Neurology on 09-05-15 for follow up with no neurologic cause. Pt notes inability to ambulate due to weakness and called EMS as she was unable to get off of the toilet. Started using wheelchair in the past week due to weakness. Pt also notes right shoulder pain s/p mechanical fall earlier this week with no head trauma. Notes pain is 5/10 and aching. Pt has Chest tightness with SOB. No CP. No N/V/D. No headaches. No fevers. No hematochezia. No black tarry stools. No other symptoms noted.  Occult blood positive In ED. Patient has not noticed blood in urine or Stool.states allergic to Lasix will cause her extreme muscle spasms ~ 30-45 minutes post administration..states baseline weight is ~218 pounds(99 kg). States does not weigh herself daily  Assessment & Plan  Obtundation / Toxic metabolic encephalopathy / yeast UTI / Agitation / Psychosis  -Probably related to UTI, urine culture grew yeast and she is treated with fluconazole -B12, folate, and ammonia recently normal  -CT and MRI brain negative for acute intracranial abnormality   -Patient was seen by psychiatry and their thought is that patient may have had steroid induced delirium but currently she is off of steroids and mental status still not improved -Continue Seroquel 12.5 mg at bedtime -Ativan and haldol  PRN for restlessness and / or agitation  -Had long conversation with husband today about mental status.  Likely unrelated to steroids.  Repeat Blood cultures negative.  Possibly due to UTI, which is being treated. Brain imaging shows no acute intracranial abnormality. Husband was frustrated that we do not know what the cause of her AMS is.  He feels it is medication related. -Neurology consulted and appreciated. ?dementia workup as an outpatient -EEG was abnormal, moderate encephalopathy  Staph agalactiae/Yeast UTI / Coagulase negative bacteremia  -Has completed a course of rocephin - no sx of ongoing infection  -Please note blood cultures with staph species coagulase negative which suggests contamination -Repeat Blood cultures 10/16/15 show no growth -Patient completed 11 days of fluconazole   Occult GI bleed / acute blood loss anemia in the setting of supratherapeutic INR -Per prior documentation, patient's husband did not want a GI evaluation at this time -Patient has received 3 units of PRBC transfusion during this hospital stay -Spoke to patient's husband, he does want GI to consult.  Patient had colonoscopy in 2014.  -Off note, she was given vitamin K on admission for supratherapeutic INR which will corrected INR -Hemoglobin 8.0 today (drop likely diluational- but has steadily dropped over the past week) -Continue to monitor CBC -FOBT ordered  Acute exacerbation of chronic grade 2 diastolic CHF -Baseline weight 93 kg 07/27/2015  -Weight in past 48 hours: 84.8 kg --> 83.9 kg . Today 81.19kg -Diuretics held due to worsening renal function -Monitor intake/output, daily weights -Patient does have incontinence.   Acute on Chronic kidney disease stage IV -Creatinine at 2.56 on  10/19/2015  -Cr peaked 3.96, torsemide was discontinued on 5/30, will discontinue metolazone today -IVF discontinued on 6/2 -Creatinine today 2.30 (back at baseline) -Continue to monitor BMP  Paroxysmal  Atrial fibrillation/ Supratherapuetic INR -CHADS vasc score 4 -Per patient's husband, wanted to resume Coumadin  -INR was up to 5.43 dosing per pharmacy (spoke with pharmacy, no coumadin given over he last 6 days, ?effect of fluconazole) -Rate controlled with metoprolol  -Vit K (oral) given on 6/4 -INR today 1.61  Essential hypertension -Continue metoprolol, imdur, hydralazine   OSA -CPAP QHS   Hypothyroidism -Continue Synthroid  -TSH normal 10/05/15  History of DVT -Most recent duplex in 2014 was negative for DVT -LE doppler 5/16 - no DVT  Obesity  -Body mass index is 30.68 kg/(m^2).  DVT Prophylaxis  Coumadin, supratherapeutic INR  Code Status: DNR  Family Communication:  None at bedside. Husband via phone  Disposition Plan: Admitted. Will monitor INR/CBC for additional day. Consult GI. FOBT pending  Consultants Cardiology Gastroenterology Neurology  Procedures  LE doppler 10/08/15, neg for DVT PICC placed on 10/05/15 EEG  Antibiotics   Anti-infectives    Start     Dose/Rate Route Frequency Ordered Stop   10/19/15 1000  fluconazole (DIFLUCAN) tablet 100 mg  Status:  Discontinued     100 mg Oral Daily 10/18/15 1632 10/26/15 1040   10/17/15 1100  fluconazole (DIFLUCAN) IVPB 100 mg  Status:  Discontinued     100 mg 50 mL/hr over 60 Minutes Intravenous Every 24 hours 10/17/15 0841 10/18/15 1632   10/15/15 1100  fluconazole (DIFLUCAN) IVPB 200 mg  Status:  Discontinued     200 mg 100 mL/hr over 60 Minutes Intravenous Every 24 hours 10/15/15 1034 10/17/15 0841   10/06/15 0915  cefTRIAXone (ROCEPHIN) 1 g in dextrose 5 % 50 mL IVPB     1 g 100 mL/hr over 30 Minutes Intravenous Every 24 hours 10/06/15 0913 10/11/15 1020      Subjective:   Wendy Cole seen and examined today. Denies chest pain or shortness of breath, abdominal pain. States she is feeling better this morning as is watching news.   Objective:   Filed Vitals:   10/28/15 1434 10/28/15  1944 10/28/15 2129 10/29/15 0500  BP: 141/63  164/57   Pulse: 69  66   Temp: 98.5 F (36.9 C)  98 F (36.7 C)   TempSrc: Oral  Oral   Resp: 17  18   Height:      Weight:    83.915 kg (185 lb)  SpO2: 99% 95% 100%     Intake/Output Summary (Last 24 hours) at 10/29/15 1238 Last data filed at 10/29/15 0626  Gross per 24 hour  Intake    510 ml  Output      0 ml  Net    510 ml   Filed Weights   10/27/15 0537 10/28/15 0529 10/29/15 0500  Weight: 82.4 kg (181 lb 10.5 oz) 81.194 kg (179 lb) 83.915 kg (185 lb)    Exam  General: Well developed, well nourished, NAD  HEENT: NCAT, mucous membranes moist.   Cardiovascular: S1 S2 auscultated, 2/6 SEM, RRR  Respiratory: Diminished but clear   Abdomen: Soft, nontender, nondistended, + bowel sounds  Extremities: warm dry without cyanosis clubbing. Dressing place on LLE  Neuro: AAOx1 (self only), nonfocal  Data Reviewed: I have personally reviewed following labs and imaging studies  CBC:  Recent Labs Lab 10/25/15 0355 10/26/15 0530 10/27/15 0639 10/28/15 0524 10/29/15 0418  WBC  8.6 6.5 6.6 5.6 6.7  HGB 8.9* 8.8* 8.7* 8.0* 8.0*  HCT 31.8* 31.3* 30.4* 28.2* 28.7*  MCV 80.5 78.4 78.4 78.3 78.6  PLT 418* 475* 402* 427* [id number]*   Basic Metabolic Panel:  Recent Labs Lab 10/24/15 0550 10/25/15 0355 10/26/15 0530 10/27/15 0639 10/28/15 0524  NA 132* 136 136 136 135  K 4.1 4.2 4.0 3.8 3.9  CL 97* 98* 99* 99* 100*  CO2 24 24 26 24 25   GLUCOSE 85 96 96 102* 84  BUN 61* 59* 57* 61* 64*  CREATININE 3.36* 2.92* 2.46* 2.31* 2.30*  CALCIUM 7.5* 8.1* 8.5* 8.5* 8.6*   GFR: Estimated Creatinine Clearance: 22.3 mL/min (by C-G formula based on Cr of 2.3). Liver Function Tests:  Recent Labs Lab 10/23/15 0450 10/25/15 0355  AST 39 31  ALT 21 20  ALKPHOS 154* 148*  BILITOT 1.4* 1.2  PROT 5.7* 5.5*  ALBUMIN 2.4* 2.2*   No results for input(s): LIPASE, AMYLASE in the last 168 hours. No results for input(s): AMMONIA in the  last 168 hours. Coagulation Profile:  Recent Labs Lab 10/25/15 0355 10/26/15 0530 10/27/15 0639 10/28/15 0524 10/29/15 0418  INR 4.11* 4.95* 5.43* 2.31* 1.61*   Cardiac Enzymes: No results for input(s): CKTOTAL, CKMB, CKMBINDEX, TROPONINI in the last 168 hours. BNP (last 3 results)  Recent Labs  07/22/15 1503  PROBNP >5000.0*   HbA1C: No results for input(s): HGBA1C in the last 72 hours. CBG: No results for input(s): GLUCAP in the last 168 hours. Lipid Profile: No results for input(s): CHOL, HDL, LDLCALC, TRIG, CHOLHDL, LDLDIRECT in the last 72 hours. Thyroid Function Tests: No results for input(s): TSH, T4TOTAL, FREET4, T3FREE, THYROIDAB in the last 72 hours. Anemia Panel: No results for input(s): VITAMINB12, FOLATE, FERRITIN, TIBC, IRON, RETICCTPCT in the last 72 hours. Urine analysis:    Component Value Date/Time   COLORURINE YELLOW 10/05/2015 1233   APPEARANCEUR TURBID* 10/05/2015 1233   LABSPEC 1.019 10/05/2015 1233   LABSPEC 1.010 06/21/2013 1213   PHURINE 5.0 10/05/2015 1233   PHURINE 6.0 06/21/2013 1213   GLUCOSEU NEGATIVE 10/05/2015 1233   GLUCOSEU NEGATIVE 03/20/2014 1454   GLUCOSEU Negative 06/21/2013 1213   HGBUR LARGE* 10/05/2015 1233   HGBUR Negative 06/21/2013 1213   BILIRUBINUR NEGATIVE 10/05/2015 1233   BILIRUBINUR Negative 06/21/2013 1213   KETONESUR NEGATIVE 10/05/2015 1233   KETONESUR Negative 06/21/2013 1213   PROTEINUR 100* 10/05/2015 1233   PROTEINUR 100 06/21/2013 1213   UROBILINOGEN 0.2 03/20/2014 1454   UROBILINOGEN 0.2 06/21/2013 1213   NITRITE NEGATIVE 10/05/2015 1233   NITRITE Negative 06/21/2013 1213   LEUKOCYTESUR LARGE* 10/05/2015 1233   LEUKOCYTESUR Negative 06/21/2013 1213   Sepsis Labs: @LABRCNTIP (procalcitonin:4,lacticidven:4)  ) No results found for this or any previous visit (from the past 240 hour(s)).    Radiology Studies: No results found.   Scheduled Meds: . allopurinol  100 mg Oral Daily  . antiseptic  oral rinse  7 mL Mouth Rinse q12n4p  . aspirin EC  81 mg Oral Daily  . brimonidine  1 drop Both Eyes BID  . buPROPion  150 mg Oral Daily  . chlorhexidine  15 mL Mouth Rinse BID  . cholecalciferol  1,000 Units Oral BID  . hydrALAZINE  25 mg Oral Q6H  . isosorbide mononitrate  60 mg Oral Daily  . levothyroxine  137 mcg Oral QAC breakfast  . lidocaine  1 patch Transdermal Q24H  . metoprolol tartrate  50 mg Oral BID  . mometasone-formoterol  2  puff Inhalation BID  . pantoprazole  40 mg Oral BID  . polyethylene glycol  17 g Oral Daily  . QUEtiapine  12.5 mg Oral QHS  . timolol  1 drop Both Eyes BID  . warfarin  4 mg Oral ONCE-1800  . Warfarin - Pharmacist Dosing Inpatient   Does not apply q1800   Continuous Infusions:     LOS: 24 days   Time Spent in minutes   30 minutes  Marcina Kinnison D.O. on 10/29/2015 at 12:38 PM  Between 7am to 7pm - Pager - [phone]  After 7pm go to [email or web address] - password [email or web address]  And look for the night coverage person covering for me after hours  [facility] Office  [phone]

## 2015-10-29 NOTE — Progress Notes (Signed)
RT was able to give [facility]  2 puffs with spacer with no complications. Pt refuses to let RT check HR and sat. Pt confused. RN notified

## 2015-10-30 DIAGNOSIS — E039 Hypothyroidism, unspecified: Secondary | ICD-10-CM

## 2015-10-30 LAB — BASIC METABOLIC PANEL
ANION GAP: 11 (ref 5–15)
BUN: 51 mg/dL — ABNORMAL HIGH (ref 6–20)
CALCIUM: 8.8 mg/dL — AB (ref 8.9–10.3)
CHLORIDE: 99 mmol/L — AB (ref 101–111)
CO2: 24 mmol/L (ref 22–32)
Creatinine, Ser: 1.98 mg/dL — ABNORMAL HIGH (ref 0.44–1.00)
GFR calc non Af Amer: 23 mL/min — ABNORMAL LOW (ref >60)
GFR, EST AFRICAN AMERICAN: 27 mL/min — AB (ref >60)
GLUCOSE: 78 mg/dL (ref 65–99)
POTASSIUM: 3.8 mmol/L (ref 3.5–5.1)
Sodium: 134 mmol/L — ABNORMAL LOW (ref 135–145)

## 2015-10-30 LAB — CBC
HEMATOCRIT: 25.4 % — AB (ref 36.0–46.0)
HEMATOCRIT: 26.2 % — AB (ref 36.0–46.0)
HEMOGLOBIN: 7.4 g/dL — AB (ref 12.0–15.0)
HEMOGLOBIN: 7.6 g/dL — AB (ref 12.0–15.0)
MCH: 22.8 pg — ABNORMAL LOW (ref 26.0–34.0)
MCH: 22.9 pg — AB (ref 26.0–34.0)
MCHC: 29.1 g/dL — ABNORMAL LOW (ref 30.0–36.0)
MCHC: 29 g/dL — AB (ref 30.0–36.0)
MCV: 78.4 fL (ref 78.0–100.0)
MCV: 78.9 fL (ref 78.0–100.0)
Platelets: 372 10*3/uL (ref 150–400)
RBC: 3.24 MIL/uL — ABNORMAL LOW (ref 3.87–5.11)
RBC: 3.32 MIL/uL — AB (ref 3.87–5.11)
RDW: 24.9 % — AB (ref 11.5–15.5)
RDW: 24.9 % — ABNORMAL HIGH (ref 11.5–15.5)
WBC: 7 10*3/uL (ref 4.0–10.5)
WBC: 7.1 10*3/uL (ref 4.0–10.5)

## 2015-10-30 LAB — PROTIME-INR
INR: 2.05 — ABNORMAL HIGH (ref 0.00–1.49)
PROTHROMBIN TIME: 23 s — AB (ref 11.6–15.2)

## 2015-10-30 MED ORDER — ACETAMINOPHEN 500 MG PO TABS: 500.0000 mg | ORAL_TABLET | Freq: Four times a day (QID) | ORAL | Status: AC

## 2015-10-30 MED ORDER — TRAMADOL HCL 50 MG PO TABS: 50.0000 mg | ORAL_TABLET | Freq: Four times a day (QID) | ORAL | Status: AC | PRN

## 2015-10-30 MED ORDER — WARFARIN SODIUM 3 MG PO TABS
3.0000 mg | ORAL_TABLET | Freq: Once | ORAL | Status: DC
  Filled 2015-10-30: qty 1

## 2015-10-30 MED ORDER — FERROUS SULFATE 325 (65 FE) MG PO TABS: 325.0000 mg | ORAL_TABLET | Freq: Every day | ORAL | Status: DC

## 2015-10-30 NOTE — Discharge Instructions (Signed)
Anemia, Nonspecific Anemia is a condition in which the concentration of red blood cells or hemoglobin in the blood is below normal. Hemoglobin is a substance in red blood cells that carries oxygen to the tissues of the body. Anemia results in not enough oxygen reaching these tissues.  CAUSES  Common causes of anemia include:   Excessive bleeding. Bleeding may be internal or external. This includes excessive bleeding from periods (in women) or from the intestine.   Poor nutrition.   Chronic kidney, thyroid, and liver disease.  Bone marrow disorders that decrease red blood cell production.  Cancer and treatments for cancer.  HIV, AIDS, and their treatments.  Spleen problems that increase red blood cell destruction.  Blood disorders.  Excess destruction of red blood cells due to infection, medicines, and autoimmune disorders. SIGNS AND SYMPTOMS   Minor weakness.   Dizziness.   Headache.  Palpitations.   Shortness of breath, especially with exercise.   Paleness.  Cold sensitivity.  Indigestion.  Nausea.  Difficulty sleeping.  Difficulty concentrating. Symptoms may occur suddenly or they may develop slowly.  DIAGNOSIS  Additional blood tests are often needed. These help your health care provider determine the best treatment. Your health care provider will check your stool for blood and look for other causes of blood loss.  TREATMENT  Treatment varies depending on the cause of the anemia. Treatment can include:   Supplements of iron, vitamin B12, or folic acid.   Hormone medicines.   A blood transfusion. This may be needed if blood loss is severe.   Hospitalization. This may be needed if there is significant continual blood loss.   Dietary changes.  Spleen removal. HOME CARE INSTRUCTIONS Keep all follow-up appointments. It often takes many weeks to correct anemia, and having your health care provider check on your condition and your response to  treatment is very important. SEEK IMMEDIATE MEDICAL CARE IF:   You develop extreme weakness, shortness of breath, or chest pain.   You become dizzy or have trouble concentrating.  You develop heavy vaginal bleeding.   You develop a rash.   You have bloody or black, tarry stools.   You faint.   You vomit up blood.   You vomit repeatedly.   You have abdominal pain.  You have a fever or persistent symptoms for more than 2-3 days.   You have a fever and your symptoms suddenly get worse.   You are dehydrated.  MAKE SURE YOU:  Understand these instructions.  Will watch your condition.  Will get help right away if you are not doing well or get worse.   This information is not intended to replace advice given to you by your health care provider. Make sure you discuss any questions you have with your health care provider.   Document Released: 06/18/2004 Document Revised: 01/11/2013 Document Reviewed: 11/04/2012 Elsevier Interactive Patient Education 2016 [employer]  

## 2015-10-30 NOTE — Progress Notes (Signed)
Patient to be transported to [facility] via [place]. Report called and given to nurse. Husband at bedside. Patient belongings will be sent with [facility]. Patient ready for discharge.

## 2015-10-30 NOTE — Clinical Social Work Note (Signed)
Patient to be d/c'ed today to [facility].  Patient and family agreeable to plans will transport via ems RN to call report to [phone].  Husband at bedside and was updated about transfer to SNF.  CSW also gave patient and her husband some information about ALFs to look at and research after she has finished with therapy.  Patient's son Leonor Liv [phone] was also updated about transfer to SNF today.  Evette Cristal, MSW, [place]

## 2015-10-30 NOTE — Progress Notes (Signed)
ANTICOAGULATION CONSULT NOTE - Follow Up Consult  Pharmacy Consult:  Coumadin Indication: atrial fibrillation  Allergies  Allergen Reactions  . Amlodipine     edema  . Atorvastatin Other (See Comments)    Muscle aches  . Colesevelam Other (See Comments)    unknown  . Coumadin [Warfarin Sodium]     "causes cramps" reaction to brand name only per pt  . Lasix [Furosemide] Other (See Comments)    Muscle cramps  . Statins Other (See Comments)    Muscle aches  . Tape Rash    Patient Measurements: Height: 5\' 6"  (167.6 cm) Weight: 181 lb (82.101 kg) IBW/kg (Calculated) : 59.3  Vital Signs: Temp: 98.6 F (37 C) (06/07 0551) Temp Source: Oral (06/07 0551) BP: 158/77 mmHg (06/07 0551) Pulse Rate: 88 (06/07 0551)  Labs:  Recent Labs  10/28/15 0524 10/29/15 0418 10/30/15 0540 10/30/15 0640  HGB 8.0* 8.0* 7.4*  --   HCT 28.2* 28.7* 25.4*  --   PLT 427* 431* QUESTIONABLE RESULTS, RECOMMEND RECOLLECT TO VERIFY  --   LABPROT 25.2* 19.2*  --   --   INR 2.31* 1.61*  --   --   CREATININE 2.30*  --   --  1.98*    Estimated Creatinine Clearance: 25.7 mL/min (by C-G formula based on Cr of 1.98).    Assessment: 78 YOF with history of Afib and DVT to continue on Coumadin.  Of note, patient had elevated INRs PTA and upon admission.  She also has a lower GIB on admit and patient's husband decline GI evaluation at that time.  GI saw patient on 10/29/15 and decision was made not to pursue endoscopic evaluation.  Coumadin has been on hold since 10/21/15, yet patient's INR kept trending up, likely due to DDI with fluconazole and limited PO intake.  Her last dose of fluconazole was on 10/26/15 and she also received Vitamin K 5mg  PO on 10/27/15.  Patient refused AM labs today so no INR is available to guide dosing.  Given recent GIB, will be conservative with today's dosing.   Goal of Therapy:  INR 2-3, aim for low end given GIB on admit   Plan:  - Coumadin 3mg  PO today.  May change if  patient allows blood draw - phlebotomy to attempt collection again sometime today. - Daily PT / INR - Monitor closely for bleeding   Greer Wainright D. Mina Marble, PharmD, BCPS Pager:  [phone] 10/30/2015, 1:43 PM

## 2015-10-30 NOTE — Discharge Summary (Addendum)
Physician Discharge Summary  Wendy Cole [id number] DOB: [birth year] DOA: 10/05/2015  PCP: Walker Kehr, MD  Admit date: 10/05/2015 Discharge date: 10/30/2015  Disposition: [place]  Recommendations for Outpatient Follow-up:  1. Follow up with PCP in 1-2 weeks 2. Please obtain PT/INR on 10/31/15 and Please check CBC in 1 week and weekly.   Please check PT/INR on 10/31/15 and adjust warfarin as needed for INR 2-3 goal.   Check PT/INR at least 2 times per week until stable. Please check CBC weekly and transfuse 1-2 unit PRBC for Hg less than 7.   Please monitor blood pressure and adjust medications as needed to get better blood pressure control.  Fall Precautions recommended.    Discharge Condition: Stable but guarded CODE STATUS: DNR Diet recommendation: Heart Healthy   Brief Narrative:  HPI On 10/05/2015 by Dr. Dia Crawford Wendy Cole is a 80 y.o. WF PMHx Anxiety, Depression,CVA, CAD native artery, Paroxysmal Atrial Fibrillation on Warfarin Rx, Chronic Diastolic CHF/Cardiomyopathy, NSTEMI, HTN, Stage III CKD, Hypothyroid, OSA and hx of Breast Cancer S/P Rt. Mastectomy HLD, DVT,  Who presents to the ED , presents to the Emergency Department today complaining of BLE swelling and shortness of breath. Noted increasing weakness since DC from hospital on 07-30-15 due to syncopal episode. Seen Neurology on 09-05-15 for follow up with no neurologic cause. Pt notes inability to ambulate due to weakness and called EMS as she was unable to get off of the toilet. Started using wheelchair in the past week due to weakness. Pt also notes right shoulder pain s/p mechanical fall earlier this week with no head trauma. Notes pain is 5/10 and aching. Pt has Chest tightness with SOB. No CP. No N/V/D. No headaches. No fevers. No hematochezia. No black tarry stools. No other symptoms noted.  Occult blood positive In ED. Patient has not noticed blood in urine or Stool.states allergic  to Lasix will cause her extreme muscle spasms ~ 30-45 minutes post administration..states baseline weight is ~218 pounds(99 kg). States does not weigh herself daily  Brief/Interim Summary: Obtundation / Toxic metabolic encephalopathy / yeast UTI / Agitation / Psychosis  -Probably related to UTI, urine culture grew yeast and she is treated with fluconazole -B12, folate, and ammonia recently normal  -CT and MRI brain negative for acute intracranial abnormality  -Patient was seen by psychiatry and their thought is that patient may have had steroid induced delirium but currently she is off of steroids  -Continue Seroquel 12.5 mg at bedtime -Had long conversation with Family about mental status. Likely unrelated to steroids. Repeat Blood cultures negative. Possibly due to UTI, which is treated. Brain imaging shows no acute intracranial abnormality.  -Neurology consulted and appreciated. ?dementia and further Parkinson's workup as an outpatient -EEG was abnormal, moderate encephalopathy  Staph agalactiae/Yeast UTI / Coagulase negative bacteremia  -Has completed a course of rocephin - no sx of ongoing infection  -Please note blood cultures with staph species coagulase negative which suggests contamination -Repeat Blood cultures 10/16/15 show no growth -Patient completed 11 days of fluconazole   Occult GI bleed / acute blood loss anemia in the setting of supratherapeutic INR -Per prior documentation, GI evaluation done on 6/6 and family decided against further GI workup.   -Patient has received 3 units of PRBC transfusion during this hospital stay - Patient had colonoscopy in 2014.  -Off note, she was given vitamin K on admission for supratherapeutic INR which will corrected INR -Hemoglobin 8.0 today (drop likely diluational- but  has steadily dropped over the past week) -Continue to monitor CBC weekly outpatient -per GI note:Considering her health and underlying medical problems we have  elected to be conservative and not proceed with further endoscopic evaluation which in her case do pose risks which I went over with them. I would recommend conservative management with supplemental iron therapy, and periodic transfusions if necessary.   Acute exacerbation of chronic grade 2 diastolic CHF -Baseline weight 93 kg 07/27/2015  -Weight i: 84.8 kg --> 83.9 kg .  81.19kg -Diuretics held due to worsening renal function but will restart metolazone now.   -Monitor intake/output, daily weights -Patient does have incontinence.   Acute on Chronic kidney disease stage IV -Creatinine at 2.56 on 10/19/2015  -Cr peaked 3.96, torsemide was discontinued on 5/30.   -IVF discontinued on 6/2 -Creatinine  (back at baseline) at discharge -Continue to monitor BMP  Paroxysmal Atrial fibrillation/ Supratherapuetic INR -CHADS vasc score 4 -Per patient's husband, wanted to resume Coumadin  -INR was up to 5.43 dosing per pharmacy (spoke with pharmacy, no coumadin given over he last 6 days, ?effect of fluconazole) -Rate controlled with metoprolol  -Vit K (oral) given on 6/4 -Please check PT/INR on 10/31/15  Essential hypertension -Continue metoprolol, imdur, hydralazine   OSA -CPAP QHS   Hypothyroidism -Continue Synthroid  -TSH normal 10/05/15  History of DVT -Most recent duplex in 2014 was negative for DVT -LE doppler 5/16 - no DVT -Family decided to continue warfarin therapy, despite risks which were discussed.  Obesity  -Body mass index is 30.68 kg/(m^2).  DVT Prophylaxis Coumadin, supratherapeutic INR  Code Status: DNR  Family Communication: None at bedside. Husband via phone  Disposition Plan: SNF  Consultants Cardiology Gastroenterology Neurology  Procedures  LE doppler 10/08/15, neg for DVT PICC placed on 10/05/15 EEG  Discharge Diagnoses:  Principal Problem:   Absolute anemia Active Problems:   Essential hypertension   History of DVT (deep vein  thrombosis)   OSA on CPAP   Anemia   CKD (chronic kidney disease) stage 3, GFR 30-59 ml/min   Stroke (HCC)   Chronic anticoagulation   Hypothyroidism   Chronic venous embolism and thrombosis of deep vessels of lower extremity (HCC)   Supratherapeutic INR   Lower GI bleed   Diabetes mellitus type 2, uncontrolled, with complications (HCC)   Anxiety   Depression   CAD in native artery   Paroxysmal atrial fibrillation (HCC)   Chronic diastolic CHF (congestive heart failure) (HCC)   Cardiomyopathy ([place])   HLD (hyperlipidemia)   CHF, acute on chronic ([place])   Bleeding gastrointestinal   Urinary tract infection, site not specified   Controlled diabetes mellitus type 2 with complications (HCC)   Chronic kidney disease, stage III (moderate)   Abdominal pain   Altered mental state   Edema of both legs   Psychotic episode   Pulmonary edema   Altered mental status  Discharge Instructions  Discharge Instructions    Amb Referral to HF Clinic    Complete by:  As directed      Diet - low sodium heart healthy    Complete by:  As directed   Vit K consistent     Discharge instructions    Complete by:  As directed   Please check PT/INR on 10/31/15 and adjust warfarin as needed for INR 2-3 goal.   Check PT/INR at least 2 times per week until stable. Please check CBC weekly and transfuse 1-2 unit PRBC for Hg less than 7.  Fall Precautions recommended. Please provide air overlay mattress if available.  Resume PT/OT services for rehabilitation.     Increase activity slowly    Complete by:  As directed             Medication List    STOP taking these medications        carvedilol 12.5 MG tablet  Commonly known as:  COREG     clorazepate 7.5 MG tablet  Commonly known as:  TRANXENE     predniSONE 5 MG tablet  Commonly known as:  DELTASONE     torsemide 20 MG tablet  Commonly known as:  DEMADEX      TAKE these medications        acetaminophen 500 MG tablet  Commonly known as:   TYLENOL  Take 1 tablet (500 mg total) by mouth every 6 (six) hours.     albuterol 108 (90 Base) MCG/ACT inhaler  Commonly known as:  PROVENTIL HFA;VENTOLIN HFA  Inhale 2 puffs into the lungs every 6 (six) hours as needed for wheezing or shortness of breath.     allopurinol 100 MG tablet  Commonly known as:  ZYLOPRIM  Take 1 tablet (100 mg total) by mouth daily.     aspirin 81 MG EC tablet  Take 1 tablet (81 mg total) by mouth daily.     brimonidine 0.2 % ophthalmic solution  Commonly known as:  ALPHAGAN  Place 1 drop into both eyes 2 (two) times daily.     buPROPion 150 MG 12 hr tablet  Commonly known as:  WELLBUTRIN SR  Take 150 mg by mouth daily.     cholecalciferol 1000 units tablet  Commonly known as:  VITAMIN D  Take 1,000 Units by mouth 2 (two) times daily.     ferrous sulfate 325 (65 FE) MG tablet  TAKE 1 TABLET BY MOUTH DAILY WITH BREAKFAST     Fluticasone-Salmeterol 100-50 MCG/DOSE Aepb  Commonly known as:  ADVAIR DISKUS  Inhale 1 puff into the lungs every 12 (twelve) hours. Discontinue all previous refills for this medication. Hold/Fill when appropriate according to pt rx fill schedule.     gabapentin 400 MG capsule  Commonly known as:  NEURONTIN  Take 1 capsule (400 mg total) by mouth 2 (two) times daily.     hydrALAZINE 25 MG tablet  Commonly known as:  APRESOLINE  Take 1 tablet (25 mg total) by mouth every 6 (six) hours.     isosorbide mononitrate 60 MG 24 hr tablet  Commonly known as:  IMDUR  TAKE 1 TABLET (60 MG TOTAL) BY MOUTH DAILY.     levothyroxine 137 MCG tablet  Commonly known as:  SYNTHROID, LEVOTHROID  TAKE 1 TABLET BY MOUTH DAILY     metolazone 2.5 MG tablet  Commonly known as:  ZAROXOLYN  Take 2.5 mg by mouth daily.     metoprolol 50 MG tablet  Commonly known as:  LOPRESSOR  Take 1 tablet (50 mg total) by mouth 2 (two) times daily.     pantoprazole 40 MG tablet  Commonly known as:  PROTONIX  Take 1 tablet (40 mg total) by mouth 2  (two) times daily.     prednisoLONE acetate 1 % ophthalmic suspension  Commonly known as:  PRED FORTE  Place 1 drop into both eyes every 4 (four) hours as needed (eye inflammation/pain).     PRESCRIPTION MEDICATION  Inhale into the lungs at bedtime. CPAP - 10     QUEtiapine 25  MG tablet  Commonly known as:  SEROQUEL  Take 0.5 tablets (12.5 mg total) by mouth at bedtime.     timolol 0.5 % ophthalmic solution  Commonly known as:  TIMOPTIC  Place 1 drop into both eyes 2 (two) times daily.     traMADol 50 MG tablet  Commonly known as:  ULTRAM  Take 1 tablet (50 mg total) by mouth every 6 (six) hours as needed.     warfarin 3 MG tablet  Commonly known as:  COUMADIN  TAKE AS DIRECTED BY ANTICOAGULATION CLINIC           Follow-up Information    Follow up with [facility].   Specialty:  [place] information:   [address] [place] [phone]      Follow up with Walker Kehr, MD. Schedule an appointment as soon as possible for a visit in 1 week.   Specialty:  Internal Medicine   Why:  Hospital follow up   Contact information:   [place] [id number] [phone]       Follow up with Dudley Major, DO. Schedule an appointment as soon as possible for a visit in 2 weeks.   Specialty:  Neurology   Why:  Neurology follow up    Contact information:   [address] [place]  [address] [phone]      Allergies  Allergen Reactions  . Amlodipine     edema  . Atorvastatin Other (See Comments)    Muscle aches  . Colesevelam Other (See Comments)    unknown  . Coumadin [Warfarin Sodium]     "causes cramps" reaction to brand name only per pt  . Lasix [Furosemide] Other (See Comments)    Muscle cramps  . Statins Other (See Comments)    Muscle aches  . Tape Rash     Procedures/Studies: Ct Head Wo Contrast  10/12/2015  CLINICAL DATA:  Altered mental  status EXAM: CT HEAD WITHOUT CONTRAST TECHNIQUE: Contiguous axial images were obtained from the base of the skull through the vertex without intravenous contrast. COMPARISON:  10/07/2015 FINDINGS: No evidence of parenchymal hemorrhage or extra-axial fluid collection. No mass lesion, mass effect, or midline shift. No CT evidence of acute infarction. Subcortical white matter and periventricular small vessel ischemic changes, particularly along the occipital horns of the lateral ventricles. Cortical and central atrophy.  Secondary mild ventriculomegaly. The visualized paranasal sinuses are essentially clear. The mastoid air cells are unopacified. No evidence of calvarial fracture. IMPRESSION: No evidence of acute intracranial abnormality. Atrophy with small vessel ischemic changes. Electronically Signed   By: Julian Hy M.D.   On: 10/12/2015 16:20   Ct Head Wo Contrast  10/07/2015  CLINICAL DATA:  Altered mental status.  Asymmetric pupils. EXAM: CT HEAD WITHOUT CONTRAST TECHNIQUE: Contiguous axial images were obtained from the base of the skull through the vertex without intravenous contrast. COMPARISON:  07/24/2015 FINDINGS: Diffuse cerebral atrophy. Ventricular dilatation consistent with central atrophy. Low-attenuation changes in the periventricular white matter consistent with small vessel ischemia. No mass effect or midline shift. No abnormal extra-axial fluid collections. Gray-white matter junctions are distinct. Basal cisterns are not effaced. No evidence of acute intracranial hemorrhage. No depressed skull fractures. Opacification of a few of the ethmoid air cells and of the mastoid air cells bilaterally. Vascular calcifications. Postoperative changes in the left orbit. IMPRESSION: No acute intracranial abnormalities. Chronic atrophy and small vessel ischemic  changes. Opacification of ethmoid air cells. Bilateral mastoid effusions. Electronically Signed   By: Lucienne Capers M.D.   On: 10/07/2015  01:46   Mr Brain Wo Contrast  10/13/2015  CLINICAL DATA:  Progressive lower extremity weakness following syncopal episode three months ago. Mechanical fall earlier this week. Unable to get up from the commode yesterday. EXAM: MRI HEAD WITHOUT CONTRAST TECHNIQUE: Multiplanar, multiecho pulse sequences of the brain and surrounding structures were obtained without intravenous contrast. COMPARISON:  CT head without contrast 10/12/2015. FINDINGS: Diffusion-weighted images demonstrate no evidence for acute or subacute infarction. Mild generalized atrophy and and white matter disease is somewhat advanced for age. A remote cortical infarct is again seen in the posterior right temporal lobe. Areas of remote cortical infarction are present in the posterior left frontal lobe. A remote white matter and cortical infarct is evident inferior to the occipital horn of the left lateral ventricle. No acute hemorrhage or mass lesion is present. The internal auditory canals are normal bilaterally. The brainstem is within normal limits. Cerebellar atrophy is proportionate to the supratentorial atrophy. Flow is present in the major intracranial arteries. Bilateral lens replacements are present. The globes and orbits are intact. The paranasal sinuses are clear. There is some fluid in the mastoid air cells bilaterally, left greater than right. No obstructing nasopharyngeal lesion is evident. There is some fluid in the left posterior nasopharynx. IMPRESSION: 1. No acute intracranial abnormality. 2. Mild age advanced atrophy and diffuse white matter disease likely reflects the sequela of chronic microvascular ischemia. 3. Remote cortical infarcts involving the posterior left frontal lobe in the right temporal lobe. Remote cortical infarct adjacent to the occipital horn of the left lateral ventricle. Electronically Signed   By: San Morelle M.D.   On: 10/13/2015 14:50   Dg Chest Port 1 View  10/07/2015  CLINICAL DATA:  Pulmonary  edema. EXAM: PORTABLE CHEST 1 VIEW COMPARISON:  10/06/2015. FINDINGS: Left PICC line in stable position. Mediastinum and hilar structures are normal. Stable cardiomegaly. Lung volumes with mild left mid lung field bibasilar atelectasis. Small left pleural effusion cannot be excluded. No pneumothorax. IMPRESSION: 1.  PICC line in stable position. 2. Stable cardiomegaly. 3. Low lung volumes with stable left mid lung field and bibasilar subsegmental atelectasis. Electronically Signed   By: Marcello Moores  Register   On: 10/07/2015 07:04   Dg Chest Port 1 View  10/06/2015  CLINICAL DATA:  PICC line placement EXAM: PORTABLE CHEST 1 VIEW COMPARISON:  10/05/2015 FINDINGS: There is a new left upper extremity PICC line with tip in the expected location of the SVC-azygos vein junction. There is unchanged moderate cardiomegaly. IMPRESSION: Satisfactorily positioned left upper extremity PICC line Electronically Signed   By: Andreas Newport M.D.   On: 10/06/2015 04:43   Dg Chest Portable 1 View  10/05/2015  CLINICAL DATA:  Shortness of breath, weakness, bilateral leg edema EXAM: PORTABLE CHEST 1 VIEW COMPARISON:  07/23/2015 FINDINGS: Cardiomegaly is noted. Central mild vascular congestion without pulmonary edema. Stable linear atelectasis or scarring in lingula. Mild basilar atelectasis without segmental infiltrate. IMPRESSION: Cardiomegaly. Central vascular congestion without pulmonary edema. Mild basilar atelectasis. Stable linear atelectasis or scarring in lingula. Electronically Signed   By: Lahoma Crocker M.D.   On: 10/05/2015 13:30   Dg Shoulder Right Port  10/05/2015  CLINICAL DATA:  Right shoulder pain, fell at home EXAM: PORTABLE RIGHT SHOULDER - 2+ VIEW COMPARISON:  None. FINDINGS: Two views of the right shoulder submitted. No acute fracture or subluxation. Degenerative changes AC  joint. Minimal spurring of humeral head IMPRESSION: No acute fracture or subluxation.  Mild degenerative changes. Electronically Signed   By:  Lahoma Crocker M.D.   On: 10/05/2015 13:31   Dg Abd Portable 1v  10/10/2015  CLINICAL DATA:  Right lower abdominal pain today. Constipation. Initial encounter. EXAM: PORTABLE ABDOMEN - 1 VIEW COMPARISON:  None. FINDINGS: The bowel gas pattern is normal. Mildly prominent stool burden in the rectosigmoid colon is noted. No radio-opaque calculi or other significant radiographic abnormality are seen. IMPRESSION: No acute abnormality.  Mildly prominent rectosigmoid stool burden. Electronically Signed   By: Inge Rise M.D.   On: 10/10/2015 15:10     Subjective: Pt without complaints,  She is eating better today.  She has ambulated in room today.   Discharge Exam: Filed Vitals:   10/29/15 2215 10/30/15 0551  BP: 160/41 158/77  Pulse: 58 88  Temp: 98.5 F (36.9 C) 98.6 F (37 C)  Resp: 20 15   Filed Vitals:   10/29/15 2103 10/29/15 2215 10/30/15 0500 10/30/15 0551  BP:  160/41  158/77  Pulse:  58  88  Temp:  98.5 F (36.9 C)  98.6 F (37 C)  TempSrc:  Oral  Oral  Resp:  20  15  Height:      Weight:   181 lb (82.101 kg)   SpO2: 100% 93%  94%    General: Pt is alert, not in acute distress Cardiovascular: normal S1/S2 +, no rubs, no gallops Respiratory: CTA bilaterally, no wheezing, no rhonchi Abdominal: Soft, NT, ND, bowel sounds + Extremities: no cyanosis  The results of significant diagnostics from this hospitalization (including imaging, microbiology, ancillary and laboratory) are listed below for reference.    Microbiology: No results found for this or any previous visit (from the past 240 hour(s)).   Labs: BNP (last 3 results)  Recent Labs  04/26/15 1651 07/23/15 1912 10/05/15 1150  BNP 3957.1* 2008.1* 123XX123*   Basic Metabolic Panel:  Recent Labs Lab 10/25/15 0355 10/26/15 0530 10/27/15 0639 10/28/15 0524 10/30/15 0640  NA 136 136 136 135 134*  K 4.2 4.0 3.8 3.9 3.8  CL 98* 99* 99* 100* 99*  CO2 24 26 24 25 24   GLUCOSE 96 96 102* 84 78  BUN 59* 57*  61* 64* 51*  CREATININE 2.92* 2.46* 2.31* 2.30* 1.98*  CALCIUM 8.1* 8.5* 8.5* 8.6* 8.8*   Liver Function Tests:  Recent Labs Lab 10/25/15 0355  AST 31  ALT 20  ALKPHOS 148*  BILITOT 1.2  PROT 5.5*  ALBUMIN 2.2*   No results for input(s): LIPASE, AMYLASE in the last 168 hours. No results for input(s): AMMONIA in the last 168 hours. CBC:  Recent Labs Lab 10/27/15 0639 10/28/15 0524 10/29/15 0418 10/30/15 0540 10/30/15 1505  WBC 6.6 5.6 6.7 7.0 7.1  HGB 8.7* 8.0* 8.0* 7.4* 7.6*  HCT 30.4* 28.2* 28.7* 25.4* 26.2*  MCV 78.4 78.3 78.6 78.4 78.9  PLT 402* 427* 431* QUESTIONABLE RESULTS, RECOMMEND RECOLLECT TO VERIFY 372   Cardiac Enzymes: No results for input(s): CKTOTAL, CKMB, CKMBINDEX, TROPONINI in the last 168 hours. BNP: Invalid input(s): POCBNP CBG: No results for input(s): GLUCAP in the last 168 hours. D-Dimer No results for input(s): DDIMER in the last 72 hours. Hgb A1c No results for input(s): HGBA1C in the last 72 hours. Lipid Profile No results for input(s): CHOL, HDL, LDLCALC, TRIG, CHOLHDL, LDLDIRECT in the last 72 hours. Thyroid function studies No results for input(s): TSH, T4TOTAL, T3FREE, THYROIDAB in  the last 72 hours.  Invalid input(s): FREET3 Anemia work up No results for input(s): VITAMINB12, FOLATE, FERRITIN, TIBC, IRON, RETICCTPCT in the last 72 hours. Urinalysis    Component Value Date/Time   COLORURINE YELLOW 10/05/2015 1233   APPEARANCEUR TURBID* 10/05/2015 1233   LABSPEC 1.019 10/05/2015 1233   LABSPEC 1.010 06/21/2013 1213   PHURINE 5.0 10/05/2015 1233   PHURINE 6.0 06/21/2013 1213   GLUCOSEU NEGATIVE 10/05/2015 1233   GLUCOSEU NEGATIVE 03/20/2014 1454   GLUCOSEU Negative 06/21/2013 1213   HGBUR LARGE* 10/05/2015 1233   HGBUR Negative 06/21/2013 1213   BILIRUBINUR NEGATIVE 10/05/2015 1233   BILIRUBINUR Negative 06/21/2013 1213   KETONESUR NEGATIVE 10/05/2015 1233   KETONESUR Negative 06/21/2013 1213   PROTEINUR 100* 10/05/2015  1233   PROTEINUR 100 06/21/2013 1213   UROBILINOGEN 0.2 03/20/2014 1454   UROBILINOGEN 0.2 06/21/2013 1213   NITRITE NEGATIVE 10/05/2015 1233   NITRITE Negative 06/21/2013 1213   LEUKOCYTESUR LARGE* 10/05/2015 1233   LEUKOCYTESUR Negative 06/21/2013 1213   Sepsis Labs Invalid input(s): PROCALCITONIN,  WBC,  LACTICIDVEN Microbiology No results found for this or any previous visit (from the past 240 hour(s)).  Time coordinating discharge: Over 40 minutes  SIGNED:  Irwin Brakeman, MD  [facility] 10/30/2015, 3:21 PM Pager   If 7PM-7AM, please contact night-coverage [email or web address] Password [email or web address]

## 2015-10-30 NOTE — Progress Notes (Signed)
Nutrition Follow-up  DOCUMENTATION CODES:   Obesity unspecified  INTERVENTION:   -[employer] Cup with meals  NUTRITION DIAGNOSIS:   Inadequate oral intake related to lethargy/confusion as evidenced by meal completion < 50%.  Ongoing  GOAL:   Patient will meet greater than or equal to 90% of their needs  Progressing  MONITOR:   PO intake, Labs, Weight trends, Skin, I & O's  REASON FOR ASSESSMENT:   Low Braden    ASSESSMENT:   80 year old female with past medical history significant for CVA, CAD, paroxysmal atrial fibrillation on anticoagulation with Coumadin, chronic severe diastolic congestive heart failure, chronic kidney disease stage III, hypothyroidism, dyslipidemia, DVT, obstructive sleep apnea, breast cancer status post right mastectomy.   Pt lying in bed, very sleepy. She initially answered questions with close-ended answers, however, became more animated during end of interview.   Pt reports "ok" appetite. She reports she ate breakfast today, but refused to tell me what she ate. Per nurse tech, pt consumed about 25-50% of her meal tray last night after complaining she was "as hungry as a bull". Meal completion averaging 25%, which is an improvement from last visit.   Case discussed with RN; reports pt's mentation and intake have improved. Per conversation with nurse tech, pt requires feeding assistance. Pt complains of being hungry, but often does not consume a lot on meal trays.   CSW following. Plan to d/c to SNF [facility]) once medically stable.   Labs reviewed.   Diet Order:  Diet Heart Room service appropriate?: Yes; Fluid consistency:: Thin  Skin:  Reviewed, no issues  Last BM:  10/29/15  Height:   Ht Readings from Last 1 Encounters:  10/05/15 5\' 6"  (1.676 m)    Weight:   Wt Readings from Last 1 Encounters:  10/30/15 181 lb (82.101 kg)    Ideal Body Weight:  59.1 kg  BMI:  Body mass index is 29.23 kg/(m^2).  Estimated  Nutritional Needs:   Kcal:  1700-1900  Protein:  85-100 grams  Fluid:  1.7-1.9 L  EDUCATION NEEDS:   No education needs identified at this time  Rohan Juenger A. Jimmye Norman, RD, LDN, CDE Pager: [phone] After hours Pager: [phone]

## 2015-10-30 NOTE — Progress Notes (Signed)
Patient refused to have labs drawn this am. I spoke with lab tech and told them to retry this afternoon around 2 pm; maybe then patient will be more acceptive.

## 2015-10-31 ENCOUNTER — Telehealth: Payer: Self-pay | Admitting: *Deleted

## 2015-10-31 NOTE — Telephone Encounter (Signed)
Pt was on TCM list was admitted for obtundation, toxic metabolic encephalopy, yeast UTI, agitation/ Psychosis. D/C 10/30/15, and sent to [facility]F.It stated afer D/C from snf f/u to see MD 1-2 wks.../lmb.Marland KitchenJohny Cole

## 2015-11-04 ENCOUNTER — Encounter: Payer: Self-pay | Admitting: Internal Medicine

## 2015-11-04 ENCOUNTER — Encounter: Payer: Self-pay | Admitting: Cardiovascular Disease

## 2015-11-19 ENCOUNTER — Ambulatory Visit: Payer: Self-pay | Admitting: General Practice

## 2015-11-19 DIAGNOSIS — Z5181 Encounter for therapeutic drug level monitoring: Secondary | ICD-10-CM

## 2015-11-19 DIAGNOSIS — I480 Paroxysmal atrial fibrillation: Secondary | ICD-10-CM

## 2015-11-23 DEATH — deceased

## 2015-12-11 ENCOUNTER — Encounter: Payer: Self-pay | Admitting: Internal Medicine

## 2015-12-27 ENCOUNTER — Ambulatory Visit: Payer: Medicare Other | Admitting: Neurology

## 2016-01-02 ENCOUNTER — Other Ambulatory Visit: Payer: Medicare Other

## 2016-01-02 ENCOUNTER — Ambulatory Visit: Payer: Medicare Other | Admitting: Oncology

## 2016-01-06 ENCOUNTER — Telehealth: Payer: Self-pay | Admitting: Internal Medicine

## 2016-01-06 NOTE — Telephone Encounter (Signed)
Manus Gunning called from Palliative care want to know if Dr. Camila Li will be the attending physician for Wendy Cole. Please call her back  # [phone]

## 2016-01-06 NOTE — Telephone Encounter (Signed)
Left vm for Manus Gunning to call me back

## 2016-01-06 NOTE — Telephone Encounter (Signed)
Th pt passed on a few weeks ago 2022/12/04) Thx

## 2016-01-07 ENCOUNTER — Ambulatory Visit: Payer: Medicare Other | Admitting: Internal Medicine

## 2016-01-07 NOTE — Telephone Encounter (Signed)
Manus Gunning aware of pt decease.

## 2016-01-30 ENCOUNTER — Ambulatory Visit: Payer: Medicare Other | Admitting: Internal Medicine

## 2016-05-08 IMAGING — MR MR MRA HEAD W/O CM
1 series · 11 of 48 positions shown · non-contrast
Comparison: None.

CLINICAL DATA: Headache.  Family history of aneurysm.

EXAM:
MRA HEAD WITHOUT CONTRAST
TECHNIQUE: Angiographic images of the Circle of Willis were obtained using MRA
technique without intravenous contrast.

[Series 9: [id number] · axial · 0.6mm · 0.26mm/px · z∈[-84,+17]mm · 11 of 188 slices shown]
[im 12/188]
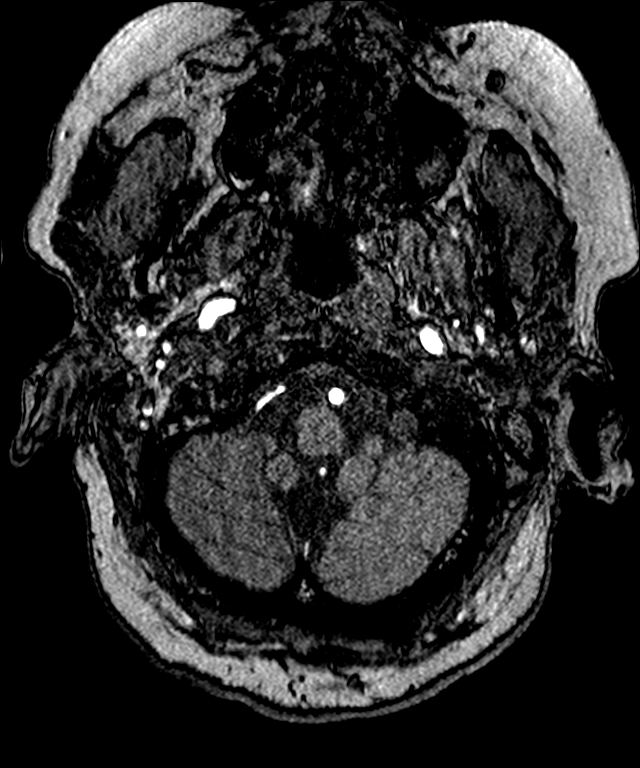
[im 32/188]
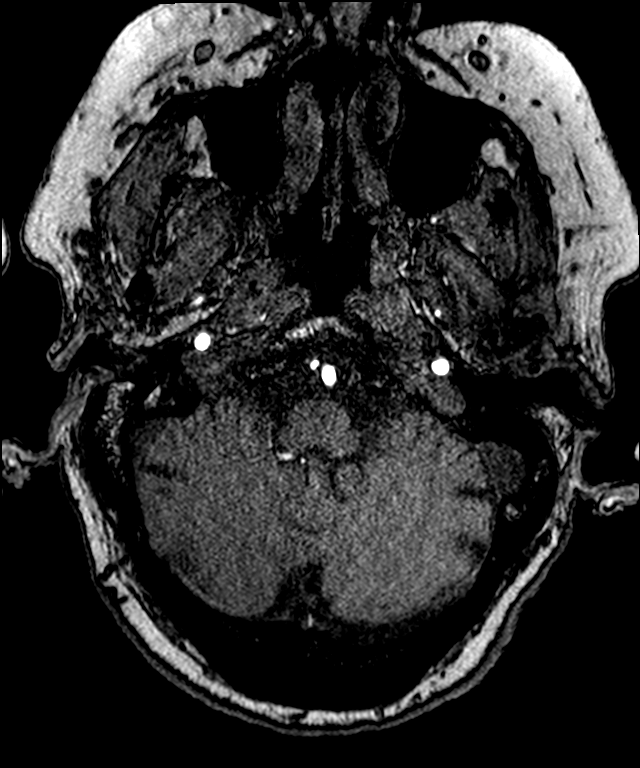
[im 36/188]
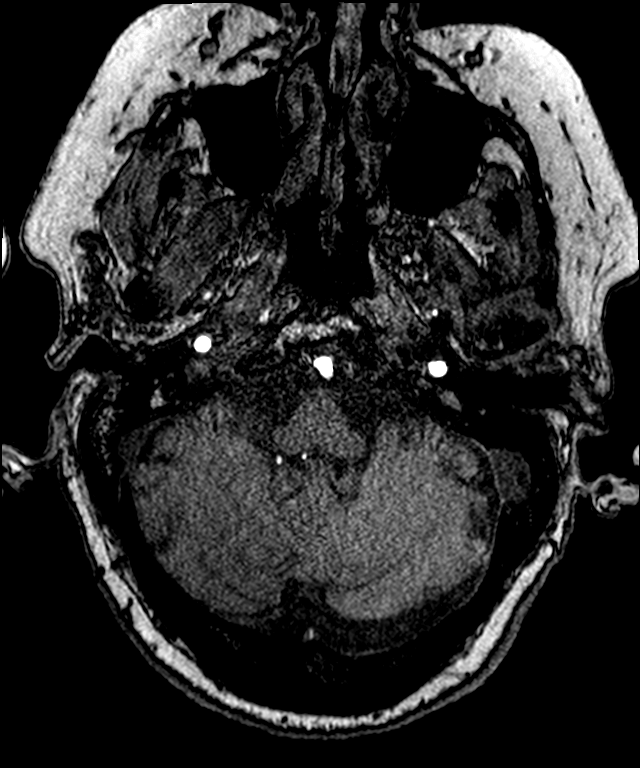
[im 60/188]
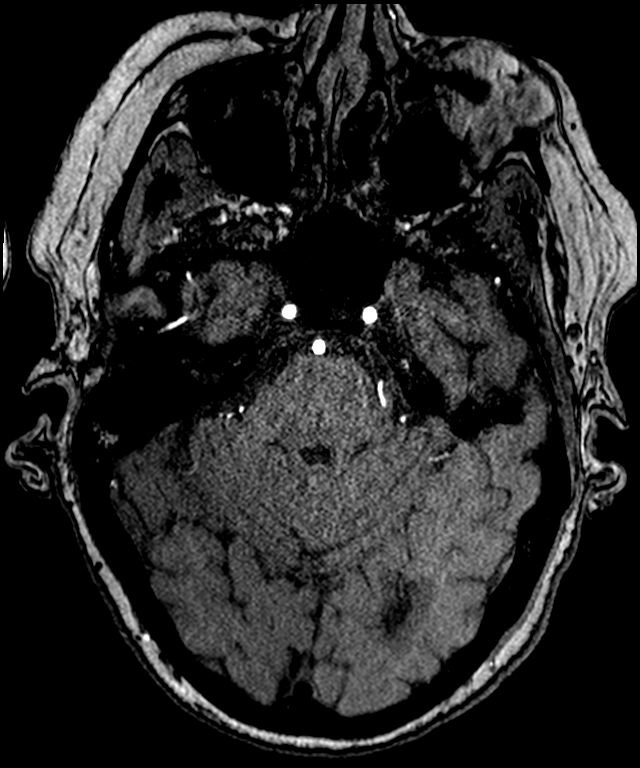
[im 84/188]
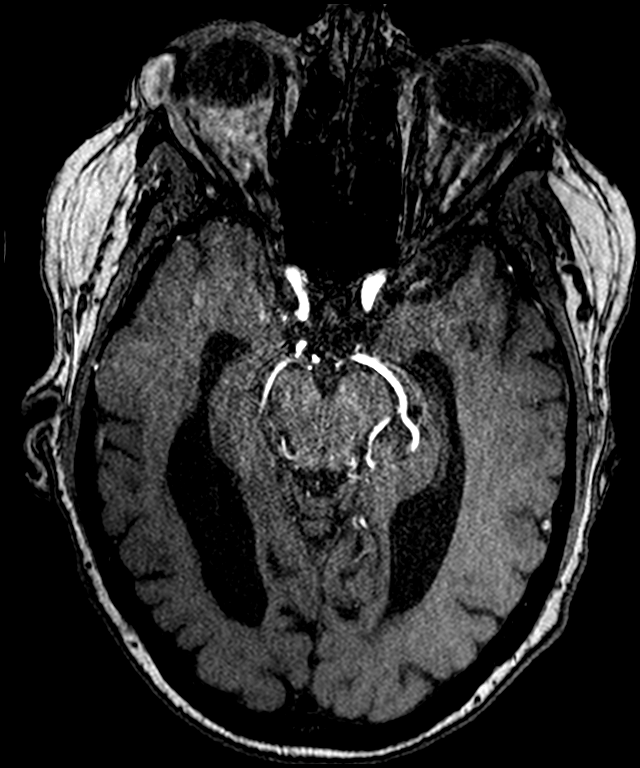
[im 96/188]
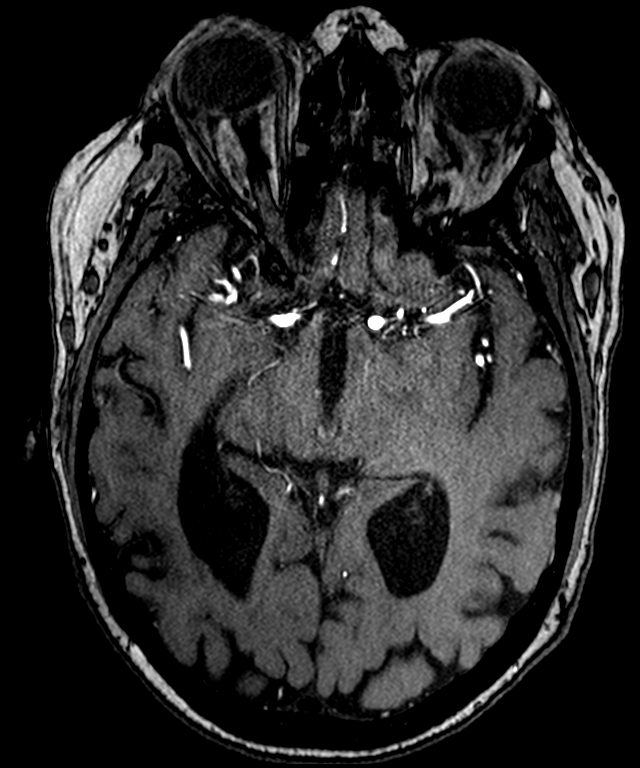
[im 108/188]
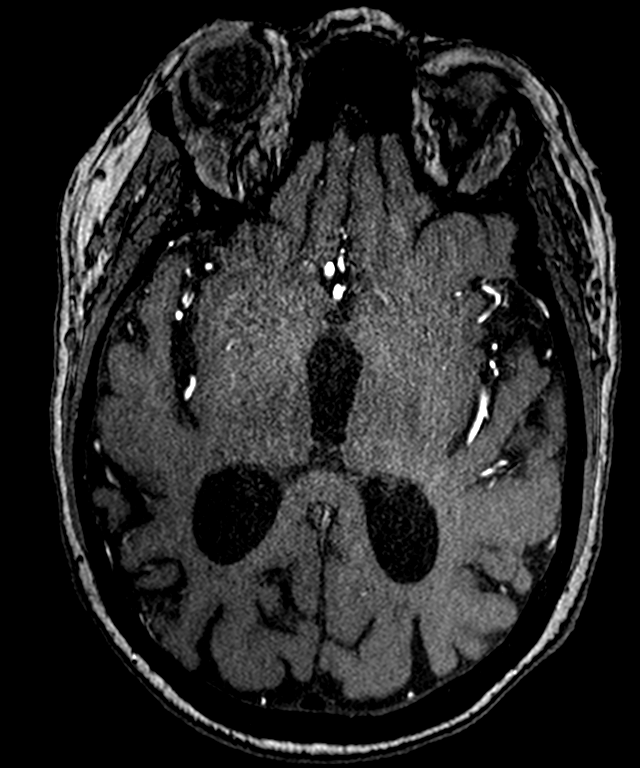
[im 132/188]
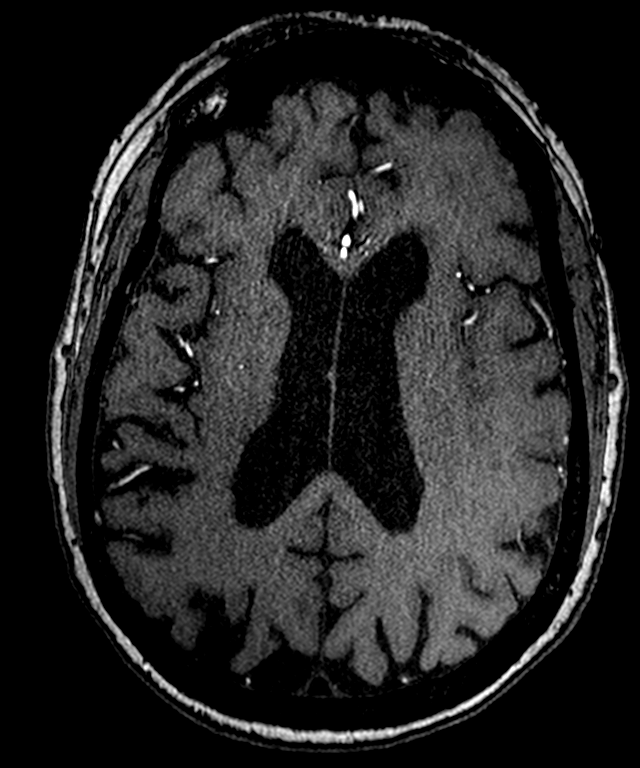
[im 156/188]
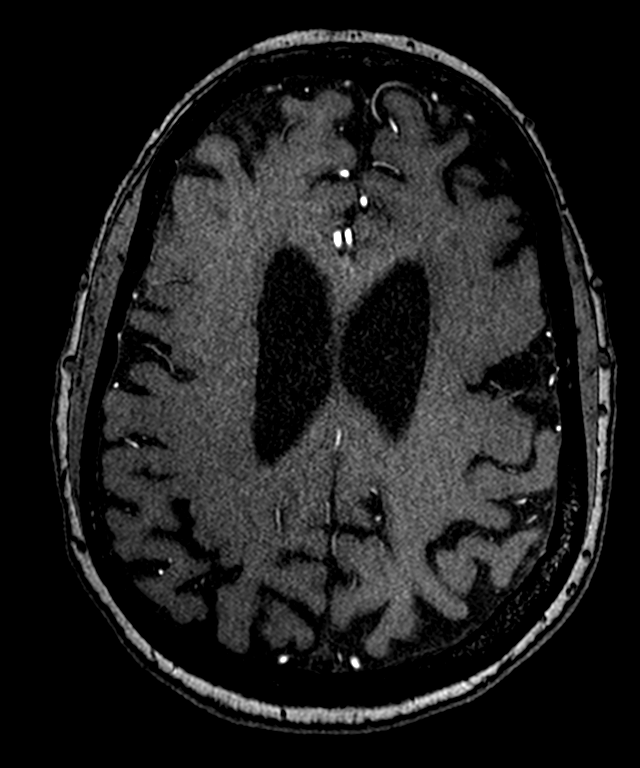
[im 160/188]
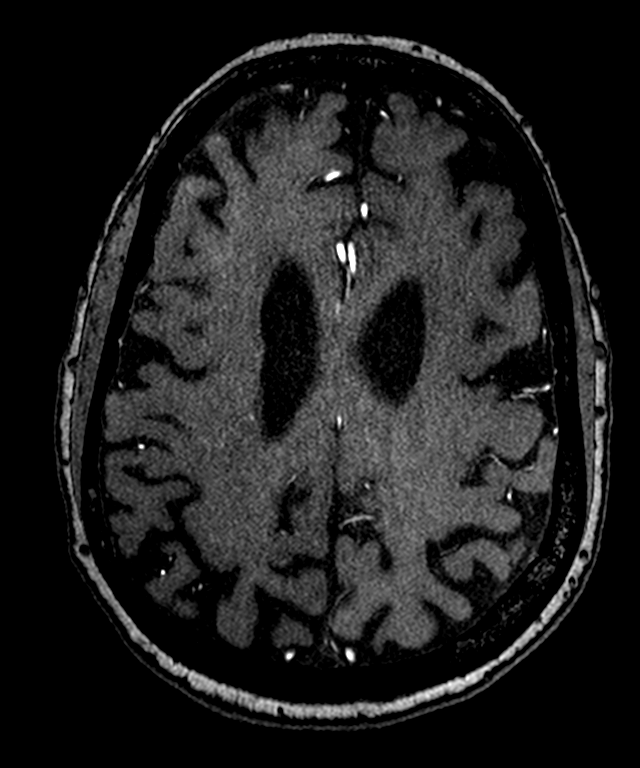
[im 180/188]
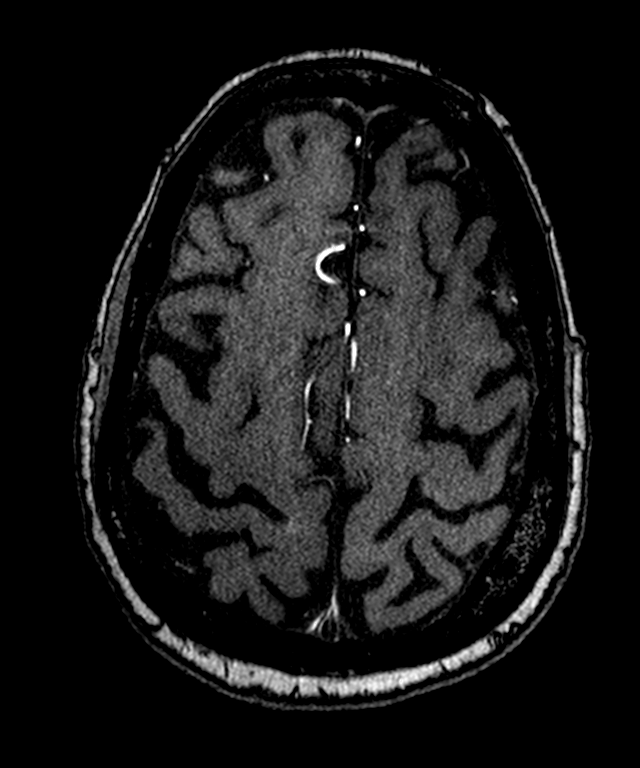

[11 of 48 positions shown; findings below may reference images not displayed]

FINDINGS: The internal carotid arteries are widely patent. The basilar artery
is widely patent. LEFT vertebral dominant. No proximal stenosis of
the anterior, middle, or posterior cerebral arteries. No cerebellar
branch occlusion. Less than 50% stenosis of the proximal RIGHT PCA.
Greater than 50% stenosis proximal RIGHT SCA. Mild irregularity of
the distal MCA and PCA branches suggesting intracranial
atherosclerotic change. No visible intracranial aneurysm.
IMPRESSION: Mild intracranial atherosclerotic change. No proximal flow-limiting
stenosis.

No visible intracranial aneurysm.

## 2016-06-12 ENCOUNTER — Ambulatory Visit: Payer: Medicare Other | Admitting: Family

## 2016-06-12 ENCOUNTER — Encounter (HOSPITAL_COMMUNITY): Payer: Medicare Other
